# Patient Record
Sex: Female | Born: 1950 | Race: White | Hispanic: No | Marital: Single | Smoking: Former smoker
Health system: Southern US, Community
[De-identification: ages and names within clinical notes are randomized; demographics above are authoritative.]

## PROBLEM LIST (undated history)

## (undated) DIAGNOSIS — I10 Essential (primary) hypertension: Secondary | ICD-10-CM

## (undated) DIAGNOSIS — E119 Type 2 diabetes mellitus without complications: Secondary | ICD-10-CM

## (undated) DIAGNOSIS — N200 Calculus of kidney: Secondary | ICD-10-CM

## (undated) DIAGNOSIS — E079 Disorder of thyroid, unspecified: Secondary | ICD-10-CM

## (undated) HISTORY — PX: ABDOMINAL HYSTERECTOMY: SHX81

## (undated) HISTORY — PX: URETERAL STENT PLACEMENT: SHX822

---

## 2004-07-02 ENCOUNTER — Emergency Department: Payer: Self-pay | Admitting: Internal Medicine

## 2004-08-09 ENCOUNTER — Emergency Department: Payer: Self-pay | Admitting: Emergency Medicine

## 2005-01-05 ENCOUNTER — Emergency Department: Payer: Self-pay | Admitting: Emergency Medicine

## 2005-05-14 ENCOUNTER — Ambulatory Visit: Payer: Self-pay | Admitting: Internal Medicine

## 2005-12-04 ENCOUNTER — Ambulatory Visit: Payer: Self-pay | Admitting: Internal Medicine

## 2005-12-09 ENCOUNTER — Ambulatory Visit: Payer: Self-pay | Admitting: Family Medicine

## 2006-01-14 ENCOUNTER — Ambulatory Visit: Payer: Self-pay | Admitting: Family Medicine

## 2006-02-12 ENCOUNTER — Ambulatory Visit: Payer: Self-pay | Admitting: Family Medicine

## 2006-03-11 ENCOUNTER — Ambulatory Visit: Payer: Self-pay | Admitting: Gastroenterology

## 2006-04-01 ENCOUNTER — Ambulatory Visit: Payer: Self-pay | Admitting: Gastroenterology

## 2006-04-09 ENCOUNTER — Ambulatory Visit: Payer: Self-pay | Admitting: Gastroenterology

## 2006-04-12 ENCOUNTER — Ambulatory Visit: Payer: Self-pay | Admitting: Gastroenterology

## 2007-07-26 ENCOUNTER — Emergency Department: Payer: Self-pay | Admitting: Emergency Medicine

## 2007-07-26 ENCOUNTER — Other Ambulatory Visit: Payer: Self-pay

## 2015-02-15 DIAGNOSIS — R600 Localized edema: Secondary | ICD-10-CM | POA: Insufficient documentation

## 2015-02-15 DIAGNOSIS — R42 Dizziness and giddiness: Secondary | ICD-10-CM | POA: Insufficient documentation

## 2015-04-19 DIAGNOSIS — Z9181 History of falling: Secondary | ICD-10-CM | POA: Insufficient documentation

## 2015-08-30 DIAGNOSIS — D500 Iron deficiency anemia secondary to blood loss (chronic): Secondary | ICD-10-CM | POA: Insufficient documentation

## 2017-03-30 ENCOUNTER — Encounter: Payer: Self-pay | Admitting: Emergency Medicine

## 2017-03-30 ENCOUNTER — Other Ambulatory Visit: Payer: Self-pay

## 2017-03-30 DIAGNOSIS — Z5321 Procedure and treatment not carried out due to patient leaving prior to being seen by health care provider: Secondary | ICD-10-CM | POA: Insufficient documentation

## 2017-03-30 DIAGNOSIS — R1031 Right lower quadrant pain: Secondary | ICD-10-CM | POA: Diagnosis present

## 2017-03-30 DIAGNOSIS — R110 Nausea: Secondary | ICD-10-CM | POA: Diagnosis not present

## 2017-03-30 LAB — CBC
HEMATOCRIT: 39.7 % (ref 35.0–47.0)
HEMOGLOBIN: 13.3 g/dL (ref 12.0–16.0)
MCH: 29.6 pg (ref 26.0–34.0)
MCHC: 33.6 g/dL (ref 32.0–36.0)
MCV: 88.2 fL (ref 80.0–100.0)
Platelets: 103 10*3/uL — ABNORMAL LOW (ref 150–440)
RBC: 4.5 MIL/uL (ref 3.80–5.20)
RDW: 14.7 % — ABNORMAL HIGH (ref 11.5–14.5)
WBC: 7.4 10*3/uL (ref 3.6–11.0)

## 2017-03-30 LAB — URINALYSIS, COMPLETE (UACMP) WITH MICROSCOPIC
BILIRUBIN URINE: NEGATIVE
Bacteria, UA: NONE SEEN
Glucose, UA: NEGATIVE mg/dL
Ketones, ur: NEGATIVE mg/dL
LEUKOCYTES UA: NEGATIVE
Nitrite: NEGATIVE
PROTEIN: 100 mg/dL — AB
Specific Gravity, Urine: 1.016 (ref 1.005–1.030)
pH: 5 (ref 5.0–8.0)

## 2017-03-30 LAB — LIPASE, BLOOD: Lipase: 44 U/L (ref 11–51)

## 2017-03-30 LAB — COMPREHENSIVE METABOLIC PANEL
ALT: 39 U/L (ref 14–54)
AST: 42 U/L — ABNORMAL HIGH (ref 15–41)
Albumin: 3.9 g/dL (ref 3.5–5.0)
Alkaline Phosphatase: 88 U/L (ref 38–126)
Anion gap: 12 (ref 5–15)
BUN: 29 mg/dL — AB (ref 6–20)
CALCIUM: 9.4 mg/dL (ref 8.9–10.3)
CO2: 27 mmol/L (ref 22–32)
Chloride: 101 mmol/L (ref 101–111)
Creatinine, Ser: 1.81 mg/dL — ABNORMAL HIGH (ref 0.44–1.00)
GFR, EST AFRICAN AMERICAN: 33 mL/min — AB (ref >60)
GFR, EST NON AFRICAN AMERICAN: 28 mL/min — AB (ref >60)
Glucose, Bld: 189 mg/dL — ABNORMAL HIGH (ref 65–99)
Potassium: 2.9 mmol/L — ABNORMAL LOW (ref 3.5–5.1)
Sodium: 140 mmol/L (ref 135–145)
Total Bilirubin: 0.8 mg/dL (ref 0.3–1.2)
Total Protein: 7.3 g/dL (ref 6.5–8.1)

## 2017-03-30 NOTE — ED Triage Notes (Signed)
Pt to triage via w/c with no distress noted; pt reports right lower abd pain since yesterday accomp by nausea; denies radiating pain; denies hx of same

## 2017-03-31 ENCOUNTER — Emergency Department
Admission: EM | Admit: 2017-03-31 | Discharge: 2017-03-31 | Disposition: A | Discharge status: Home / Self Care | Payer: Medicare Other | Attending: Emergency Medicine | Admitting: Emergency Medicine

## 2017-03-31 ENCOUNTER — Telehealth: Payer: Self-pay | Admitting: Emergency Medicine

## 2017-03-31 HISTORY — DX: Calculus of kidney: N20.0

## 2017-03-31 NOTE — ED Notes (Signed)
No answer when called from lobby for recheck

## 2017-03-31 NOTE — Telephone Encounter (Signed)
Called patient due to lwot to inquire about condition and follow up plans. Left message.   

## 2017-03-31 NOTE — ED Notes (Signed)
No answer when called several times from lobby 

## 2017-05-24 DIAGNOSIS — N049 Nephrotic syndrome with unspecified morphologic changes: Secondary | ICD-10-CM | POA: Insufficient documentation

## 2017-11-10 DIAGNOSIS — E119 Type 2 diabetes mellitus without complications: Secondary | ICD-10-CM | POA: Insufficient documentation

## 2018-03-25 DIAGNOSIS — M109 Gout, unspecified: Secondary | ICD-10-CM | POA: Diagnosis not present

## 2018-03-25 DIAGNOSIS — D500 Iron deficiency anemia secondary to blood loss (chronic): Secondary | ICD-10-CM | POA: Diagnosis not present

## 2018-03-25 DIAGNOSIS — M48061 Spinal stenosis, lumbar region without neurogenic claudication: Secondary | ICD-10-CM | POA: Diagnosis not present

## 2018-03-25 DIAGNOSIS — I50812 Chronic right heart failure: Secondary | ICD-10-CM | POA: Diagnosis not present

## 2018-03-25 DIAGNOSIS — E1142 Type 2 diabetes mellitus with diabetic polyneuropathy: Secondary | ICD-10-CM | POA: Diagnosis not present

## 2018-03-25 DIAGNOSIS — R600 Localized edema: Secondary | ICD-10-CM | POA: Diagnosis not present

## 2018-03-25 DIAGNOSIS — K6389 Other specified diseases of intestine: Secondary | ICD-10-CM | POA: Diagnosis not present

## 2018-03-25 DIAGNOSIS — N049 Nephrotic syndrome with unspecified morphologic changes: Secondary | ICD-10-CM | POA: Diagnosis not present

## 2018-03-25 DIAGNOSIS — R42 Dizziness and giddiness: Secondary | ICD-10-CM | POA: Diagnosis not present

## 2018-03-25 DIAGNOSIS — N183 Chronic kidney disease, stage 3 (moderate): Secondary | ICD-10-CM | POA: Diagnosis not present

## 2018-03-25 DIAGNOSIS — R1312 Dysphagia, oropharyngeal phase: Secondary | ICD-10-CM | POA: Diagnosis not present

## 2018-03-25 DIAGNOSIS — E034 Atrophy of thyroid (acquired): Secondary | ICD-10-CM | POA: Diagnosis not present

## 2018-03-25 DIAGNOSIS — K219 Gastro-esophageal reflux disease without esophagitis: Secondary | ICD-10-CM | POA: Diagnosis not present

## 2018-03-25 DIAGNOSIS — M25561 Pain in right knee: Secondary | ICD-10-CM | POA: Diagnosis not present

## 2018-03-25 DIAGNOSIS — E1121 Type 2 diabetes mellitus with diabetic nephropathy: Secondary | ICD-10-CM | POA: Diagnosis not present

## 2018-03-25 DIAGNOSIS — I10 Essential (primary) hypertension: Secondary | ICD-10-CM | POA: Diagnosis not present

## 2018-03-25 DIAGNOSIS — F329 Major depressive disorder, single episode, unspecified: Secondary | ICD-10-CM | POA: Diagnosis not present

## 2018-03-25 DIAGNOSIS — K769 Liver disease, unspecified: Secondary | ICD-10-CM | POA: Diagnosis not present

## 2018-03-25 DIAGNOSIS — S46011S Strain of muscle(s) and tendon(s) of the rotator cuff of right shoulder, sequela: Secondary | ICD-10-CM | POA: Diagnosis not present

## 2018-03-25 DIAGNOSIS — N202 Calculus of kidney with calculus of ureter: Secondary | ICD-10-CM | POA: Diagnosis not present

## 2018-06-02 DIAGNOSIS — I50812 Chronic right heart failure: Secondary | ICD-10-CM | POA: Diagnosis not present

## 2018-06-02 DIAGNOSIS — I349 Nonrheumatic mitral valve disorder, unspecified: Secondary | ICD-10-CM | POA: Diagnosis not present

## 2018-06-02 DIAGNOSIS — I340 Nonrheumatic mitral (valve) insufficiency: Secondary | ICD-10-CM | POA: Diagnosis not present

## 2018-06-02 DIAGNOSIS — I517 Cardiomegaly: Secondary | ICD-10-CM | POA: Diagnosis not present

## 2018-09-02 ENCOUNTER — Emergency Department: Payer: PPO

## 2018-09-02 ENCOUNTER — Other Ambulatory Visit: Payer: Self-pay

## 2018-09-02 ENCOUNTER — Emergency Department
Admission: EM | Admit: 2018-09-02 | Discharge: 2018-09-02 | Disposition: A | Discharge status: Home / Self Care | Payer: PPO | Attending: Emergency Medicine | Admitting: Emergency Medicine

## 2018-09-02 DIAGNOSIS — M7989 Other specified soft tissue disorders: Secondary | ICD-10-CM | POA: Diagnosis not present

## 2018-09-02 DIAGNOSIS — I10 Essential (primary) hypertension: Secondary | ICD-10-CM | POA: Diagnosis not present

## 2018-09-02 DIAGNOSIS — L0390 Cellulitis, unspecified: Secondary | ICD-10-CM | POA: Diagnosis not present

## 2018-09-02 DIAGNOSIS — Z87891 Personal history of nicotine dependence: Secondary | ICD-10-CM | POA: Insufficient documentation

## 2018-09-02 DIAGNOSIS — M79672 Pain in left foot: Secondary | ICD-10-CM | POA: Diagnosis present

## 2018-09-02 DIAGNOSIS — E119 Type 2 diabetes mellitus without complications: Secondary | ICD-10-CM | POA: Insufficient documentation

## 2018-09-02 DIAGNOSIS — R2242 Localized swelling, mass and lump, left lower limb: Secondary | ICD-10-CM | POA: Insufficient documentation

## 2018-09-02 DIAGNOSIS — L03116 Cellulitis of left lower limb: Secondary | ICD-10-CM | POA: Diagnosis not present

## 2018-09-02 HISTORY — DX: Type 2 diabetes mellitus without complications: E11.9

## 2018-09-02 HISTORY — DX: Essential (primary) hypertension: I10

## 2018-09-02 HISTORY — DX: Disorder of thyroid, unspecified: E07.9

## 2018-09-02 LAB — CBC WITH DIFFERENTIAL/PLATELET
Abs Immature Granulocytes: 0.02 10*3/uL (ref 0.00–0.07)
Basophils Absolute: 0 10*3/uL (ref 0.0–0.1)
Basophils Relative: 1 %
Eosinophils Absolute: 0 10*3/uL (ref 0.0–0.5)
Eosinophils Relative: 0 %
HCT: 35.4 % — ABNORMAL LOW (ref 36.0–46.0)
Hemoglobin: 11.7 g/dL — ABNORMAL LOW (ref 12.0–15.0)
Immature Granulocytes: 0 %
Lymphocytes Relative: 23 %
Lymphs Abs: 1.5 10*3/uL (ref 0.7–4.0)
MCH: 29.5 pg (ref 26.0–34.0)
MCHC: 33.1 g/dL (ref 30.0–36.0)
MCV: 89.4 fL (ref 80.0–100.0)
Monocytes Absolute: 0.6 10*3/uL (ref 0.1–1.0)
Monocytes Relative: 10 %
Neutro Abs: 4.5 10*3/uL (ref 1.7–7.7)
Neutrophils Relative %: 66 %
Platelets: 122 10*3/uL — ABNORMAL LOW (ref 150–400)
RBC: 3.96 MIL/uL (ref 3.87–5.11)
RDW: 13.8 % (ref 11.5–15.5)
WBC: 6.7 10*3/uL (ref 4.0–10.5)
nRBC: 0 % (ref 0.0–0.2)

## 2018-09-02 LAB — COMPREHENSIVE METABOLIC PANEL
ALT: 47 U/L — ABNORMAL HIGH (ref 0–44)
AST: 49 U/L — ABNORMAL HIGH (ref 15–41)
Albumin: 4.2 g/dL (ref 3.5–5.0)
Alkaline Phosphatase: 89 U/L (ref 38–126)
Anion gap: 8 (ref 5–15)
BUN: 39 mg/dL — ABNORMAL HIGH (ref 8–23)
CO2: 28 mmol/L (ref 22–32)
Calcium: 9.9 mg/dL (ref 8.9–10.3)
Chloride: 99 mmol/L (ref 98–111)
Creatinine, Ser: 2.66 mg/dL — ABNORMAL HIGH (ref 0.44–1.00)
GFR calc Af Amer: 21 mL/min — ABNORMAL LOW (ref >60)
GFR calc non Af Amer: 18 mL/min — ABNORMAL LOW (ref >60)
Glucose, Bld: 167 mg/dL — ABNORMAL HIGH (ref 70–99)
Potassium: 3 mmol/L — ABNORMAL LOW (ref 3.5–5.1)
Sodium: 135 mmol/L (ref 135–145)
Total Bilirubin: 1 mg/dL (ref 0.3–1.2)
Total Protein: 7.5 g/dL (ref 6.5–8.1)

## 2018-09-02 LAB — LACTIC ACID, PLASMA: Lactic Acid, Venous: 1.7 mmol/L (ref 0.5–1.9)

## 2018-09-02 MED ORDER — SODIUM CHLORIDE 0.9% FLUSH: 3.0000 mL | Freq: Once | INTRAVENOUS | Status: DC

## 2018-09-02 MED ORDER — CEPHALEXIN 500 MG PO CAPS: 500.0000 mg | ORAL_CAPSULE | Freq: Four times a day (QID) | ORAL | 0 refills | Status: AC

## 2018-09-02 MED ORDER — VANCOMYCIN HCL IN DEXTROSE 1-5 GM/200ML-% IV SOLN
1000.0000 mg | Freq: Once | INTRAVENOUS | Status: AC
  Administered 2018-09-02: 20:00:00 1000 mg via INTRAVENOUS
  Filled 2018-09-02: qty 200

## 2018-09-02 NOTE — ED Notes (Signed)
Doppler performed on left foot. Strong pulse noted at 62 bpm. Distal cap refill at <3 sec. Dr Archie Balboa notified.

## 2018-09-02 NOTE — ED Notes (Signed)
One set of cultures sent to lab and full rainbow plus lactic

## 2018-09-02 NOTE — Discharge Instructions (Addendum)
Please seek medical attention for any high fevers, chest pain, shortness of breath, change in behavior, persistent vomiting, bloody stool or any other new or concerning symptoms.  

## 2018-09-02 NOTE — ED Triage Notes (Addendum)
Pt comes from [place] with c/o left foot swelling. Pt states this started two days ago. Pt has noticeable swelling, redness and pain to left foot.  Pt also has sore noted to bottom of front. Pt has hx of diabetes.  Pt denies any recent fevers.

## 2018-09-02 NOTE — ED Notes (Signed)
Approx 2cm wound on bottom of left foot cleaned and dressed with telfa dressing. Pt educated on keeping site dry and dressing technique. Pt verbalized understanding.

## 2018-09-02 NOTE — ED Provider Notes (Signed)
[facility] Emergency Department Provider Note   ____________________________________________   I have reviewed the triage vital signs and the nursing notes.   HISTORY  Chief Complaint Foot Swelling   History limited by: Not Limited   HPI Autumn [name] is a 68 y.o. female who presents to the emergency department today because of concern for left foot pain and swelling. Patient states that she will occasionally get some swelling in the left foot. A couple of days ago she did pick at a callous that was on the bottom of her foot. The patient says that starting two days ago she has noticed increased swelling and red color to her foot. She has had some discomfort. She denies any fevers. Denies any nausea or vomiting.   Records reviewed. Per medical record review patient has a history of DM, HTN.   Past Medical History:  Diagnosis Date  . Diabetes mellitus without complication ([place])   . Hypertension   . Kidney stone   . Thyroid disease     There are no active problems to display for this patient.   Past Surgical History:  Procedure Laterality Date  . ABDOMINAL HYSTERECTOMY    . URETERAL STENT PLACEMENT      Prior to Admission medications   Not on File    Allergies Patient has no known allergies.  No family history on file.  Social History Social History   Tobacco Use  . Smoking status: Former Research scientist (life sciences)  . Smokeless tobacco: Never Used  Substance Use Topics  . Alcohol use: Not on file  . Drug use: Not on file    Review of Systems Constitutional: No fever/chills Eyes: No visual changes. ENT: No sore throat. Cardiovascular: Denies chest pain. Respiratory: Denies shortness of breath. Gastrointestinal: No abdominal pain.  No nausea, no vomiting.  No diarrhea.   Genitourinary: Negative for dysuria. Musculoskeletal: Left foot swelling.  Skin: Positive for redness to the left foot.  Neurological: Negative for headaches, focal weakness or  numbness.  ____________________________________________   PHYSICAL EXAM:  VITAL SIGNS: ED Triage Vitals [09/02/18 1725]  Enc Vitals Group     BP (!) 169/84     Pulse Rate 66     Resp 19     Temp 98.7 F (37.1 C)     Temp src      SpO2 99 %     Weight 208 lb (94.3 kg)     Height 5\' 5"  (1.651 m)     Head Circumference      Peak Flow      Pain Score 10   Constitutional: Alert and oriented.  Eyes: Conjunctivae are normal.  ENT      Head: Normocephalic and atraumatic.      Nose: No congestion/rhinnorhea.      Mouth/Throat: Mucous membranes are moist.      Neck: No stridor. Hematological/Lymphatic/Immunilogical: No cervical lymphadenopathy. Cardiovascular: Normal rate, regular rhythm.  No murmurs, rubs, or gallops.  Respiratory: Normal respiratory effort without tachypnea nor retractions. Breath sounds are clear and equal bilaterally. No wheezes/rales/rhonchi. Gastrointestinal: Soft and non tender. No rebound. No guarding.  Genitourinary: Deferred Musculoskeletal: Positive for swelling and erythema to the left foot.  Neurologic:  Normal speech and language. No gross focal neurologic deficits are appreciated.  Skin:  Skin is warm, dry and intact. No rash noted. Psychiatric: Mood and affect are normal. Speech and behavior are normal. Patient exhibits appropriate insight and judgment.  ____________________________________________    LABS (pertinent positives/negatives)  Lactic  1.7 CMP na 135, k 3.0, glu 167, cr 2.66 CBC wbc 6.7, hgb 11.7, plt 122  ____________________________________________   EKG  None  ____________________________________________    RADIOLOGY  Left foot No evidence of osteomyelitis  ____________________________________________   PROCEDURES  Procedures  ____________________________________________   INITIAL IMPRESSION / ASSESSMENT AND PLAN / ED COURSE  Pertinent labs & imaging results that were available during my care of the  patient were reviewed by me and considered in my medical decision making (see chart for details).   Patient presented to the emergency department today because of concerns for left foot swelling and redness.  On exam there is swelling and erythema to the foot.  There is warmth.  Do have concerns for infection given callus in the bottom of the foot that the patient opened up.  X-ray does not show signs of osteomyelitis patient without concerning leukocytosis.  Will plan on giving patient dose of antibiotics here and have been following up with podiatry.  Discussed return precautions.   ____________________________________________   FINAL CLINICAL IMPRESSION(S) / ED DIAGNOSES  Final diagnoses:  Cellulitis, unspecified cellulitis site     Note: This dictation was prepared with Dragon dictation. Any transcriptional errors that result from this process are unintentional     Nance Pear, MD 09/02/18 2308

## 2018-09-16 DIAGNOSIS — E1142 Type 2 diabetes mellitus with diabetic polyneuropathy: Secondary | ICD-10-CM | POA: Diagnosis not present

## 2018-09-16 DIAGNOSIS — I10 Essential (primary) hypertension: Secondary | ICD-10-CM | POA: Diagnosis not present

## 2018-09-16 DIAGNOSIS — N049 Nephrotic syndrome with unspecified morphologic changes: Secondary | ICD-10-CM | POA: Diagnosis not present

## 2018-09-16 DIAGNOSIS — J449 Chronic obstructive pulmonary disease, unspecified: Secondary | ICD-10-CM | POA: Diagnosis not present

## 2018-09-16 DIAGNOSIS — K769 Liver disease, unspecified: Secondary | ICD-10-CM | POA: Diagnosis not present

## 2018-09-16 DIAGNOSIS — K219 Gastro-esophageal reflux disease without esophagitis: Secondary | ICD-10-CM | POA: Diagnosis not present

## 2018-09-16 DIAGNOSIS — N390 Urinary tract infection, site not specified: Secondary | ICD-10-CM | POA: Diagnosis not present

## 2018-09-16 DIAGNOSIS — S46011S Strain of muscle(s) and tendon(s) of the rotator cuff of right shoulder, sequela: Secondary | ICD-10-CM | POA: Diagnosis not present

## 2018-09-16 DIAGNOSIS — R600 Localized edema: Secondary | ICD-10-CM | POA: Diagnosis not present

## 2018-09-16 DIAGNOSIS — F329 Major depressive disorder, single episode, unspecified: Secondary | ICD-10-CM | POA: Diagnosis not present

## 2018-09-16 DIAGNOSIS — N202 Calculus of kidney with calculus of ureter: Secondary | ICD-10-CM | POA: Diagnosis not present

## 2018-09-16 DIAGNOSIS — D500 Iron deficiency anemia secondary to blood loss (chronic): Secondary | ICD-10-CM | POA: Diagnosis not present

## 2018-09-16 DIAGNOSIS — M109 Gout, unspecified: Secondary | ICD-10-CM | POA: Diagnosis not present

## 2018-09-16 DIAGNOSIS — E034 Atrophy of thyroid (acquired): Secondary | ICD-10-CM | POA: Diagnosis not present

## 2018-09-16 DIAGNOSIS — E1121 Type 2 diabetes mellitus with diabetic nephropathy: Secondary | ICD-10-CM | POA: Diagnosis not present

## 2018-09-16 DIAGNOSIS — K6389 Other specified diseases of intestine: Secondary | ICD-10-CM | POA: Diagnosis not present

## 2018-09-16 DIAGNOSIS — N184 Chronic kidney disease, stage 4 (severe): Secondary | ICD-10-CM | POA: Diagnosis not present

## 2018-10-03 DIAGNOSIS — E11621 Type 2 diabetes mellitus with foot ulcer: Secondary | ICD-10-CM | POA: Diagnosis not present

## 2018-10-03 DIAGNOSIS — M109 Gout, unspecified: Secondary | ICD-10-CM | POA: Diagnosis not present

## 2018-10-03 DIAGNOSIS — I10 Essential (primary) hypertension: Secondary | ICD-10-CM | POA: Diagnosis not present

## 2018-10-03 DIAGNOSIS — M799 Soft tissue disorder, unspecified: Secondary | ICD-10-CM | POA: Diagnosis not present

## 2018-10-03 DIAGNOSIS — N202 Calculus of kidney with calculus of ureter: Secondary | ICD-10-CM | POA: Diagnosis not present

## 2018-10-03 DIAGNOSIS — L03818 Cellulitis of other sites: Secondary | ICD-10-CM | POA: Diagnosis not present

## 2018-10-03 DIAGNOSIS — N184 Chronic kidney disease, stage 4 (severe): Secondary | ICD-10-CM | POA: Diagnosis not present

## 2018-10-03 DIAGNOSIS — R42 Dizziness and giddiness: Secondary | ICD-10-CM | POA: Diagnosis not present

## 2018-10-03 DIAGNOSIS — M7989 Other specified soft tissue disorders: Secondary | ICD-10-CM | POA: Diagnosis not present

## 2018-10-03 DIAGNOSIS — Z1210 Encounter for screening for malignant neoplasm of intestinal tract, unspecified: Secondary | ICD-10-CM | POA: Diagnosis not present

## 2018-10-03 DIAGNOSIS — N049 Nephrotic syndrome with unspecified morphologic changes: Secondary | ICD-10-CM | POA: Diagnosis not present

## 2018-10-03 DIAGNOSIS — E1142 Type 2 diabetes mellitus with diabetic polyneuropathy: Secondary | ICD-10-CM | POA: Diagnosis not present

## 2018-10-03 DIAGNOSIS — R600 Localized edema: Secondary | ICD-10-CM | POA: Diagnosis not present

## 2018-10-03 DIAGNOSIS — L97528 Non-pressure chronic ulcer of other part of left foot with other specified severity: Secondary | ICD-10-CM | POA: Diagnosis not present

## 2018-10-03 DIAGNOSIS — Z9181 History of falling: Secondary | ICD-10-CM | POA: Diagnosis not present

## 2018-10-07 DIAGNOSIS — E1140 Type 2 diabetes mellitus with diabetic neuropathy, unspecified: Secondary | ICD-10-CM | POA: Diagnosis not present

## 2018-10-07 DIAGNOSIS — I129 Hypertensive chronic kidney disease with stage 1 through stage 4 chronic kidney disease, or unspecified chronic kidney disease: Secondary | ICD-10-CM | POA: Diagnosis not present

## 2018-10-07 DIAGNOSIS — L97529 Non-pressure chronic ulcer of other part of left foot with unspecified severity: Secondary | ICD-10-CM | POA: Diagnosis not present

## 2018-10-07 DIAGNOSIS — E1121 Type 2 diabetes mellitus with diabetic nephropathy: Secondary | ICD-10-CM | POA: Diagnosis not present

## 2018-10-07 DIAGNOSIS — E11621 Type 2 diabetes mellitus with foot ulcer: Secondary | ICD-10-CM | POA: Diagnosis not present

## 2018-10-07 DIAGNOSIS — I9589 Other hypotension: Secondary | ICD-10-CM | POA: Diagnosis not present

## 2018-10-07 DIAGNOSIS — E1165 Type 2 diabetes mellitus with hyperglycemia: Secondary | ICD-10-CM | POA: Diagnosis not present

## 2018-10-07 DIAGNOSIS — Z9181 History of falling: Secondary | ICD-10-CM | POA: Diagnosis not present

## 2018-10-07 DIAGNOSIS — N189 Chronic kidney disease, unspecified: Secondary | ICD-10-CM | POA: Diagnosis not present

## 2018-10-12 DIAGNOSIS — N184 Chronic kidney disease, stage 4 (severe): Secondary | ICD-10-CM | POA: Diagnosis not present

## 2018-10-12 DIAGNOSIS — B952 Enterococcus as the cause of diseases classified elsewhere: Secondary | ICD-10-CM | POA: Diagnosis not present

## 2018-10-12 DIAGNOSIS — Z01810 Encounter for preprocedural cardiovascular examination: Secondary | ICD-10-CM | POA: Diagnosis not present

## 2018-10-12 DIAGNOSIS — R531 Weakness: Secondary | ICD-10-CM | POA: Diagnosis not present

## 2018-10-12 DIAGNOSIS — Z20828 Contact with and (suspected) exposure to other viral communicable diseases: Secondary | ICD-10-CM | POA: Diagnosis not present

## 2018-10-12 DIAGNOSIS — E11621 Type 2 diabetes mellitus with foot ulcer: Secondary | ICD-10-CM | POA: Diagnosis not present

## 2018-10-12 DIAGNOSIS — E11622 Type 2 diabetes mellitus with other skin ulcer: Secondary | ICD-10-CM | POA: Diagnosis not present

## 2018-10-12 DIAGNOSIS — B964 Proteus (mirabilis) (morganii) as the cause of diseases classified elsewhere: Secondary | ICD-10-CM | POA: Diagnosis not present

## 2018-10-12 DIAGNOSIS — I10 Essential (primary) hypertension: Secondary | ICD-10-CM | POA: Diagnosis not present

## 2018-10-12 DIAGNOSIS — L02612 Cutaneous abscess of left foot: Secondary | ICD-10-CM | POA: Diagnosis not present

## 2018-10-12 DIAGNOSIS — L03116 Cellulitis of left lower limb: Secondary | ICD-10-CM | POA: Diagnosis not present

## 2018-10-12 DIAGNOSIS — L03115 Cellulitis of right lower limb: Secondary | ICD-10-CM | POA: Diagnosis not present

## 2018-10-12 DIAGNOSIS — Z794 Long term (current) use of insulin: Secondary | ICD-10-CM | POA: Diagnosis not present

## 2018-10-12 DIAGNOSIS — W19XXXA Unspecified fall, initial encounter: Secondary | ICD-10-CM | POA: Diagnosis not present

## 2018-10-12 DIAGNOSIS — E079 Disorder of thyroid, unspecified: Secondary | ICD-10-CM | POA: Diagnosis not present

## 2018-10-12 DIAGNOSIS — Z7982 Long term (current) use of aspirin: Secondary | ICD-10-CM | POA: Diagnosis not present

## 2018-10-12 DIAGNOSIS — Z9181 History of falling: Secondary | ICD-10-CM | POA: Diagnosis not present

## 2018-10-12 DIAGNOSIS — Z8739 Personal history of other diseases of the musculoskeletal system and connective tissue: Secondary | ICD-10-CM | POA: Diagnosis not present

## 2018-10-12 DIAGNOSIS — E1142 Type 2 diabetes mellitus with diabetic polyneuropathy: Secondary | ICD-10-CM | POA: Diagnosis not present

## 2018-10-12 DIAGNOSIS — L089 Local infection of the skin and subcutaneous tissue, unspecified: Secondary | ICD-10-CM | POA: Diagnosis not present

## 2018-10-12 DIAGNOSIS — I951 Orthostatic hypotension: Secondary | ICD-10-CM | POA: Diagnosis not present

## 2018-10-12 DIAGNOSIS — L97529 Non-pressure chronic ulcer of other part of left foot with unspecified severity: Secondary | ICD-10-CM | POA: Diagnosis not present

## 2018-10-12 DIAGNOSIS — E1122 Type 2 diabetes mellitus with diabetic chronic kidney disease: Secondary | ICD-10-CM | POA: Diagnosis not present

## 2018-10-12 DIAGNOSIS — G8918 Other acute postprocedural pain: Secondary | ICD-10-CM | POA: Diagnosis not present

## 2018-10-12 DIAGNOSIS — F329 Major depressive disorder, single episode, unspecified: Secondary | ICD-10-CM | POA: Diagnosis not present

## 2018-10-12 DIAGNOSIS — L97526 Non-pressure chronic ulcer of other part of left foot with bone involvement without evidence of necrosis: Secondary | ICD-10-CM | POA: Diagnosis not present

## 2018-10-12 DIAGNOSIS — E1169 Type 2 diabetes mellitus with other specified complication: Secondary | ICD-10-CM | POA: Diagnosis not present

## 2018-10-12 DIAGNOSIS — B9562 Methicillin resistant Staphylococcus aureus infection as the cause of diseases classified elsewhere: Secondary | ICD-10-CM | POA: Diagnosis not present

## 2018-10-12 DIAGNOSIS — S92512A Displaced fracture of proximal phalanx of left lesser toe(s), initial encounter for closed fracture: Secondary | ICD-10-CM | POA: Diagnosis not present

## 2018-10-12 DIAGNOSIS — M25775 Osteophyte, left foot: Secondary | ICD-10-CM | POA: Diagnosis not present

## 2018-10-12 DIAGNOSIS — M86172 Other acute osteomyelitis, left ankle and foot: Secondary | ICD-10-CM | POA: Diagnosis not present

## 2018-10-12 DIAGNOSIS — E1140 Type 2 diabetes mellitus with diabetic neuropathy, unspecified: Secondary | ICD-10-CM | POA: Diagnosis not present

## 2018-10-12 DIAGNOSIS — E118 Type 2 diabetes mellitus with unspecified complications: Secondary | ICD-10-CM | POA: Diagnosis not present

## 2018-10-12 DIAGNOSIS — K219 Gastro-esophageal reflux disease without esophagitis: Secondary | ICD-10-CM | POA: Diagnosis not present

## 2018-10-12 DIAGNOSIS — L97522 Non-pressure chronic ulcer of other part of left foot with fat layer exposed: Secondary | ICD-10-CM | POA: Diagnosis not present

## 2018-10-12 DIAGNOSIS — R279 Unspecified lack of coordination: Secondary | ICD-10-CM | POA: Diagnosis not present

## 2018-10-12 DIAGNOSIS — Z743 Need for continuous supervision: Secondary | ICD-10-CM | POA: Diagnosis not present

## 2018-10-12 DIAGNOSIS — M869 Osteomyelitis, unspecified: Secondary | ICD-10-CM | POA: Diagnosis not present

## 2018-10-12 DIAGNOSIS — K760 Fatty (change of) liver, not elsewhere classified: Secondary | ICD-10-CM | POA: Diagnosis not present

## 2018-10-12 DIAGNOSIS — S92351A Displaced fracture of fifth metatarsal bone, right foot, initial encounter for closed fracture: Secondary | ICD-10-CM | POA: Diagnosis not present

## 2018-10-12 DIAGNOSIS — L97424 Non-pressure chronic ulcer of left heel and midfoot with necrosis of bone: Secondary | ICD-10-CM | POA: Diagnosis not present

## 2018-10-12 DIAGNOSIS — Z452 Encounter for adjustment and management of vascular access device: Secondary | ICD-10-CM | POA: Diagnosis not present

## 2018-10-12 DIAGNOSIS — G4730 Sleep apnea, unspecified: Secondary | ICD-10-CM | POA: Diagnosis not present

## 2018-10-12 DIAGNOSIS — L97429 Non-pressure chronic ulcer of left heel and midfoot with unspecified severity: Secondary | ICD-10-CM | POA: Diagnosis not present

## 2018-10-12 DIAGNOSIS — N185 Chronic kidney disease, stage 5: Secondary | ICD-10-CM | POA: Diagnosis not present

## 2018-10-12 DIAGNOSIS — L02416 Cutaneous abscess of left lower limb: Secondary | ICD-10-CM | POA: Diagnosis not present

## 2018-10-12 DIAGNOSIS — J45909 Unspecified asthma, uncomplicated: Secondary | ICD-10-CM | POA: Diagnosis not present

## 2018-10-12 DIAGNOSIS — M109 Gout, unspecified: Secondary | ICD-10-CM | POA: Diagnosis not present

## 2018-10-12 DIAGNOSIS — I129 Hypertensive chronic kidney disease with stage 1 through stage 4 chronic kidney disease, or unspecified chronic kidney disease: Secondary | ICD-10-CM | POA: Diagnosis not present

## 2018-10-12 DIAGNOSIS — E1165 Type 2 diabetes mellitus with hyperglycemia: Secondary | ICD-10-CM | POA: Diagnosis not present

## 2018-10-12 DIAGNOSIS — Z87891 Personal history of nicotine dependence: Secondary | ICD-10-CM | POA: Diagnosis not present

## 2018-10-12 DIAGNOSIS — R633 Feeding difficulties: Secondary | ICD-10-CM | POA: Diagnosis not present

## 2018-10-12 DIAGNOSIS — R5381 Other malaise: Secondary | ICD-10-CM | POA: Diagnosis not present

## 2018-10-19 DIAGNOSIS — L97522 Non-pressure chronic ulcer of other part of left foot with fat layer exposed: Secondary | ICD-10-CM | POA: Diagnosis not present

## 2018-10-20 DIAGNOSIS — Z9181 History of falling: Secondary | ICD-10-CM | POA: Diagnosis not present

## 2018-10-20 DIAGNOSIS — F329 Major depressive disorder, single episode, unspecified: Secondary | ICD-10-CM | POA: Diagnosis not present

## 2018-10-20 DIAGNOSIS — E11621 Type 2 diabetes mellitus with foot ulcer: Secondary | ICD-10-CM | POA: Diagnosis not present

## 2018-10-20 DIAGNOSIS — L97424 Non-pressure chronic ulcer of left heel and midfoot with necrosis of bone: Secondary | ICD-10-CM | POA: Diagnosis not present

## 2018-10-20 DIAGNOSIS — W19XXXA Unspecified fall, initial encounter: Secondary | ICD-10-CM | POA: Diagnosis not present

## 2018-10-20 DIAGNOSIS — L97522 Non-pressure chronic ulcer of other part of left foot with fat layer exposed: Secondary | ICD-10-CM | POA: Diagnosis not present

## 2018-10-20 DIAGNOSIS — B952 Enterococcus as the cause of diseases classified elsewhere: Secondary | ICD-10-CM | POA: Diagnosis not present

## 2018-10-20 DIAGNOSIS — B964 Proteus (mirabilis) (morganii) as the cause of diseases classified elsewhere: Secondary | ICD-10-CM | POA: Diagnosis not present

## 2018-10-23 DIAGNOSIS — L97522 Non-pressure chronic ulcer of other part of left foot with fat layer exposed: Secondary | ICD-10-CM | POA: Diagnosis not present

## 2018-10-23 DIAGNOSIS — R69 Illness, unspecified: Secondary | ICD-10-CM | POA: Diagnosis not present

## 2018-10-23 DIAGNOSIS — Z9181 History of falling: Secondary | ICD-10-CM | POA: Diagnosis not present

## 2018-10-23 DIAGNOSIS — W19XXXA Unspecified fall, initial encounter: Secondary | ICD-10-CM | POA: Diagnosis not present

## 2018-10-23 DIAGNOSIS — B952 Enterococcus as the cause of diseases classified elsewhere: Secondary | ICD-10-CM | POA: Diagnosis not present

## 2018-10-23 DIAGNOSIS — F329 Major depressive disorder, single episode, unspecified: Secondary | ICD-10-CM | POA: Diagnosis not present

## 2018-10-23 DIAGNOSIS — B964 Proteus (mirabilis) (morganii) as the cause of diseases classified elsewhere: Secondary | ICD-10-CM | POA: Diagnosis not present

## 2018-10-23 DIAGNOSIS — L97424 Non-pressure chronic ulcer of left heel and midfoot with necrosis of bone: Secondary | ICD-10-CM | POA: Diagnosis not present

## 2018-10-23 DIAGNOSIS — E11621 Type 2 diabetes mellitus with foot ulcer: Secondary | ICD-10-CM | POA: Diagnosis not present

## 2018-10-24 DIAGNOSIS — M25562 Pain in left knee: Secondary | ICD-10-CM | POA: Diagnosis not present

## 2018-10-24 DIAGNOSIS — W19XXXA Unspecified fall, initial encounter: Secondary | ICD-10-CM | POA: Diagnosis not present

## 2018-10-24 DIAGNOSIS — N049 Nephrotic syndrome with unspecified morphologic changes: Secondary | ICD-10-CM | POA: Diagnosis not present

## 2018-10-24 DIAGNOSIS — R600 Localized edema: Secondary | ICD-10-CM | POA: Diagnosis not present

## 2018-10-24 DIAGNOSIS — E1143 Type 2 diabetes mellitus with diabetic autonomic (poly)neuropathy: Secondary | ICD-10-CM | POA: Diagnosis not present

## 2018-10-24 DIAGNOSIS — E1165 Type 2 diabetes mellitus with hyperglycemia: Secondary | ICD-10-CM | POA: Diagnosis not present

## 2018-10-24 DIAGNOSIS — E1142 Type 2 diabetes mellitus with diabetic polyneuropathy: Secondary | ICD-10-CM | POA: Diagnosis not present

## 2018-10-24 DIAGNOSIS — K769 Liver disease, unspecified: Secondary | ICD-10-CM | POA: Diagnosis not present

## 2018-10-24 DIAGNOSIS — N184 Chronic kidney disease, stage 4 (severe): Secondary | ICD-10-CM | POA: Diagnosis not present

## 2018-10-24 DIAGNOSIS — M1712 Unilateral primary osteoarthritis, left knee: Secondary | ICD-10-CM | POA: Diagnosis not present

## 2018-10-24 DIAGNOSIS — I10 Essential (primary) hypertension: Secondary | ICD-10-CM | POA: Diagnosis not present

## 2018-10-24 DIAGNOSIS — Z0000 Encounter for general adult medical examination without abnormal findings: Secondary | ICD-10-CM | POA: Diagnosis not present

## 2018-10-24 DIAGNOSIS — R110 Nausea: Secondary | ICD-10-CM | POA: Diagnosis not present

## 2018-10-24 DIAGNOSIS — M25462 Effusion, left knee: Secondary | ICD-10-CM | POA: Diagnosis not present

## 2018-10-24 DIAGNOSIS — E034 Atrophy of thyroid (acquired): Secondary | ICD-10-CM | POA: Diagnosis not present

## 2018-10-24 DIAGNOSIS — L97522 Non-pressure chronic ulcer of other part of left foot with fat layer exposed: Secondary | ICD-10-CM | POA: Diagnosis not present

## 2018-10-24 DIAGNOSIS — D500 Iron deficiency anemia secondary to blood loss (chronic): Secondary | ICD-10-CM | POA: Diagnosis not present

## 2018-10-24 DIAGNOSIS — E11621 Type 2 diabetes mellitus with foot ulcer: Secondary | ICD-10-CM | POA: Diagnosis not present

## 2018-10-27 DIAGNOSIS — Z9181 History of falling: Secondary | ICD-10-CM | POA: Diagnosis not present

## 2018-10-27 DIAGNOSIS — E11621 Type 2 diabetes mellitus with foot ulcer: Secondary | ICD-10-CM | POA: Diagnosis not present

## 2018-10-27 DIAGNOSIS — L97522 Non-pressure chronic ulcer of other part of left foot with fat layer exposed: Secondary | ICD-10-CM | POA: Diagnosis not present

## 2018-10-27 DIAGNOSIS — L97424 Non-pressure chronic ulcer of left heel and midfoot with necrosis of bone: Secondary | ICD-10-CM | POA: Diagnosis not present

## 2018-10-30 DIAGNOSIS — B964 Proteus (mirabilis) (morganii) as the cause of diseases classified elsewhere: Secondary | ICD-10-CM | POA: Diagnosis not present

## 2018-10-30 DIAGNOSIS — Z9181 History of falling: Secondary | ICD-10-CM | POA: Diagnosis not present

## 2018-10-30 DIAGNOSIS — W19XXXA Unspecified fall, initial encounter: Secondary | ICD-10-CM | POA: Diagnosis not present

## 2018-10-30 DIAGNOSIS — B952 Enterococcus as the cause of diseases classified elsewhere: Secondary | ICD-10-CM | POA: Diagnosis not present

## 2018-10-30 DIAGNOSIS — L97424 Non-pressure chronic ulcer of left heel and midfoot with necrosis of bone: Secondary | ICD-10-CM | POA: Diagnosis not present

## 2018-10-30 DIAGNOSIS — F329 Major depressive disorder, single episode, unspecified: Secondary | ICD-10-CM | POA: Diagnosis not present

## 2018-10-30 DIAGNOSIS — L97522 Non-pressure chronic ulcer of other part of left foot with fat layer exposed: Secondary | ICD-10-CM | POA: Diagnosis not present

## 2018-10-30 DIAGNOSIS — M869 Osteomyelitis, unspecified: Secondary | ICD-10-CM | POA: Diagnosis not present

## 2018-10-30 DIAGNOSIS — E11621 Type 2 diabetes mellitus with foot ulcer: Secondary | ICD-10-CM | POA: Diagnosis not present

## 2018-11-02 DIAGNOSIS — M86172 Other acute osteomyelitis, left ankle and foot: Secondary | ICD-10-CM | POA: Diagnosis not present

## 2018-11-02 DIAGNOSIS — E1169 Type 2 diabetes mellitus with other specified complication: Secondary | ICD-10-CM | POA: Diagnosis not present

## 2018-11-02 DIAGNOSIS — E11621 Type 2 diabetes mellitus with foot ulcer: Secondary | ICD-10-CM | POA: Diagnosis not present

## 2018-11-02 DIAGNOSIS — M86272 Subacute osteomyelitis, left ankle and foot: Secondary | ICD-10-CM | POA: Diagnosis not present

## 2018-11-02 DIAGNOSIS — L97424 Non-pressure chronic ulcer of left heel and midfoot with necrosis of bone: Secondary | ICD-10-CM | POA: Diagnosis not present

## 2018-11-03 DIAGNOSIS — L97522 Non-pressure chronic ulcer of other part of left foot with fat layer exposed: Secondary | ICD-10-CM | POA: Diagnosis not present

## 2018-11-06 DIAGNOSIS — F329 Major depressive disorder, single episode, unspecified: Secondary | ICD-10-CM | POA: Diagnosis not present

## 2018-11-06 DIAGNOSIS — B964 Proteus (mirabilis) (morganii) as the cause of diseases classified elsewhere: Secondary | ICD-10-CM | POA: Diagnosis not present

## 2018-11-06 DIAGNOSIS — W19XXXA Unspecified fall, initial encounter: Secondary | ICD-10-CM | POA: Diagnosis not present

## 2018-11-06 DIAGNOSIS — L97522 Non-pressure chronic ulcer of other part of left foot with fat layer exposed: Secondary | ICD-10-CM | POA: Diagnosis not present

## 2018-11-06 DIAGNOSIS — L97512 Non-pressure chronic ulcer of other part of right foot with fat layer exposed: Secondary | ICD-10-CM | POA: Diagnosis not present

## 2018-11-06 DIAGNOSIS — L97424 Non-pressure chronic ulcer of left heel and midfoot with necrosis of bone: Secondary | ICD-10-CM | POA: Diagnosis not present

## 2018-11-06 DIAGNOSIS — E11621 Type 2 diabetes mellitus with foot ulcer: Secondary | ICD-10-CM | POA: Diagnosis not present

## 2018-11-06 DIAGNOSIS — Z9181 History of falling: Secondary | ICD-10-CM | POA: Diagnosis not present

## 2018-11-06 DIAGNOSIS — B952 Enterococcus as the cause of diseases classified elsewhere: Secondary | ICD-10-CM | POA: Diagnosis not present

## 2018-11-07 DIAGNOSIS — I951 Orthostatic hypotension: Secondary | ICD-10-CM | POA: Insufficient documentation

## 2018-11-07 DIAGNOSIS — E876 Hypokalemia: Secondary | ICD-10-CM | POA: Diagnosis not present

## 2018-11-07 DIAGNOSIS — S46011S Strain of muscle(s) and tendon(s) of the rotator cuff of right shoulder, sequela: Secondary | ICD-10-CM | POA: Diagnosis not present

## 2018-11-07 DIAGNOSIS — K6389 Other specified diseases of intestine: Secondary | ICD-10-CM | POA: Diagnosis not present

## 2018-11-07 DIAGNOSIS — D500 Iron deficiency anemia secondary to blood loss (chronic): Secondary | ICD-10-CM | POA: Diagnosis not present

## 2018-11-07 DIAGNOSIS — K769 Liver disease, unspecified: Secondary | ICD-10-CM | POA: Diagnosis not present

## 2018-11-07 DIAGNOSIS — L97424 Non-pressure chronic ulcer of left heel and midfoot with necrosis of bone: Secondary | ICD-10-CM | POA: Diagnosis not present

## 2018-11-07 DIAGNOSIS — I10 Essential (primary) hypertension: Secondary | ICD-10-CM | POA: Diagnosis not present

## 2018-11-07 DIAGNOSIS — M25562 Pain in left knee: Secondary | ICD-10-CM | POA: Diagnosis not present

## 2018-11-07 DIAGNOSIS — E11621 Type 2 diabetes mellitus with foot ulcer: Secondary | ICD-10-CM | POA: Diagnosis not present

## 2018-11-07 DIAGNOSIS — E034 Atrophy of thyroid (acquired): Secondary | ICD-10-CM | POA: Diagnosis not present

## 2018-11-07 DIAGNOSIS — R600 Localized edema: Secondary | ICD-10-CM | POA: Diagnosis not present

## 2018-11-09 DIAGNOSIS — E1169 Type 2 diabetes mellitus with other specified complication: Secondary | ICD-10-CM | POA: Diagnosis not present

## 2018-11-09 DIAGNOSIS — E11621 Type 2 diabetes mellitus with foot ulcer: Secondary | ICD-10-CM | POA: Diagnosis not present

## 2018-11-09 DIAGNOSIS — M86272 Subacute osteomyelitis, left ankle and foot: Secondary | ICD-10-CM | POA: Diagnosis not present

## 2018-11-09 DIAGNOSIS — L97424 Non-pressure chronic ulcer of left heel and midfoot with necrosis of bone: Secondary | ICD-10-CM | POA: Diagnosis not present

## 2018-11-13 DIAGNOSIS — B952 Enterococcus as the cause of diseases classified elsewhere: Secondary | ICD-10-CM | POA: Diagnosis not present

## 2018-11-13 DIAGNOSIS — F329 Major depressive disorder, single episode, unspecified: Secondary | ICD-10-CM | POA: Diagnosis not present

## 2018-11-13 DIAGNOSIS — B964 Proteus (mirabilis) (morganii) as the cause of diseases classified elsewhere: Secondary | ICD-10-CM | POA: Diagnosis not present

## 2018-11-13 DIAGNOSIS — L97522 Non-pressure chronic ulcer of other part of left foot with fat layer exposed: Secondary | ICD-10-CM | POA: Diagnosis not present

## 2018-11-13 DIAGNOSIS — L97424 Non-pressure chronic ulcer of left heel and midfoot with necrosis of bone: Secondary | ICD-10-CM | POA: Diagnosis not present

## 2018-11-13 DIAGNOSIS — Z9181 History of falling: Secondary | ICD-10-CM | POA: Diagnosis not present

## 2018-11-13 DIAGNOSIS — E11621 Type 2 diabetes mellitus with foot ulcer: Secondary | ICD-10-CM | POA: Diagnosis not present

## 2018-11-13 DIAGNOSIS — W19XXXA Unspecified fall, initial encounter: Secondary | ICD-10-CM | POA: Diagnosis not present

## 2018-11-16 DIAGNOSIS — L97424 Non-pressure chronic ulcer of left heel and midfoot with necrosis of bone: Secondary | ICD-10-CM | POA: Diagnosis not present

## 2018-11-16 DIAGNOSIS — E11621 Type 2 diabetes mellitus with foot ulcer: Secondary | ICD-10-CM | POA: Diagnosis not present

## 2018-11-16 DIAGNOSIS — M86272 Subacute osteomyelitis, left ankle and foot: Secondary | ICD-10-CM | POA: Diagnosis not present

## 2018-11-16 DIAGNOSIS — E1169 Type 2 diabetes mellitus with other specified complication: Secondary | ICD-10-CM | POA: Diagnosis not present

## 2018-11-16 DIAGNOSIS — L97528 Non-pressure chronic ulcer of other part of left foot with other specified severity: Secondary | ICD-10-CM | POA: Diagnosis not present

## 2018-11-16 DIAGNOSIS — R600 Localized edema: Secondary | ICD-10-CM | POA: Diagnosis not present

## 2018-11-17 DIAGNOSIS — L97522 Non-pressure chronic ulcer of other part of left foot with fat layer exposed: Secondary | ICD-10-CM | POA: Diagnosis not present

## 2018-11-20 DIAGNOSIS — M869 Osteomyelitis, unspecified: Secondary | ICD-10-CM | POA: Diagnosis not present

## 2018-11-22 DIAGNOSIS — E11621 Type 2 diabetes mellitus with foot ulcer: Secondary | ICD-10-CM | POA: Diagnosis not present

## 2018-11-22 DIAGNOSIS — W19XXXA Unspecified fall, initial encounter: Secondary | ICD-10-CM | POA: Diagnosis not present

## 2018-11-22 DIAGNOSIS — B964 Proteus (mirabilis) (morganii) as the cause of diseases classified elsewhere: Secondary | ICD-10-CM | POA: Diagnosis not present

## 2018-11-22 DIAGNOSIS — L97522 Non-pressure chronic ulcer of other part of left foot with fat layer exposed: Secondary | ICD-10-CM | POA: Diagnosis not present

## 2018-11-22 DIAGNOSIS — Z9181 History of falling: Secondary | ICD-10-CM | POA: Diagnosis not present

## 2018-11-22 DIAGNOSIS — F329 Major depressive disorder, single episode, unspecified: Secondary | ICD-10-CM | POA: Diagnosis not present

## 2018-11-22 DIAGNOSIS — M869 Osteomyelitis, unspecified: Secondary | ICD-10-CM | POA: Diagnosis not present

## 2018-11-22 DIAGNOSIS — B952 Enterococcus as the cause of diseases classified elsewhere: Secondary | ICD-10-CM | POA: Diagnosis not present

## 2018-11-22 DIAGNOSIS — L97424 Non-pressure chronic ulcer of left heel and midfoot with necrosis of bone: Secondary | ICD-10-CM | POA: Diagnosis not present

## 2018-11-24 DIAGNOSIS — S91312A Laceration without foreign body, left foot, initial encounter: Secondary | ICD-10-CM | POA: Diagnosis not present

## 2018-11-24 DIAGNOSIS — L97522 Non-pressure chronic ulcer of other part of left foot with fat layer exposed: Secondary | ICD-10-CM | POA: Diagnosis not present

## 2018-11-27 DIAGNOSIS — B964 Proteus (mirabilis) (morganii) as the cause of diseases classified elsewhere: Secondary | ICD-10-CM | POA: Diagnosis not present

## 2018-11-27 DIAGNOSIS — W19XXXA Unspecified fall, initial encounter: Secondary | ICD-10-CM | POA: Diagnosis not present

## 2018-11-27 DIAGNOSIS — L97522 Non-pressure chronic ulcer of other part of left foot with fat layer exposed: Secondary | ICD-10-CM | POA: Diagnosis not present

## 2018-11-27 DIAGNOSIS — F329 Major depressive disorder, single episode, unspecified: Secondary | ICD-10-CM | POA: Diagnosis not present

## 2018-11-27 DIAGNOSIS — Z9181 History of falling: Secondary | ICD-10-CM | POA: Diagnosis not present

## 2018-11-27 DIAGNOSIS — M869 Osteomyelitis, unspecified: Secondary | ICD-10-CM | POA: Diagnosis not present

## 2018-11-27 DIAGNOSIS — B952 Enterococcus as the cause of diseases classified elsewhere: Secondary | ICD-10-CM | POA: Diagnosis not present

## 2018-11-27 DIAGNOSIS — L97424 Non-pressure chronic ulcer of left heel and midfoot with necrosis of bone: Secondary | ICD-10-CM | POA: Diagnosis not present

## 2018-11-27 DIAGNOSIS — E11621 Type 2 diabetes mellitus with foot ulcer: Secondary | ICD-10-CM | POA: Diagnosis not present

## 2018-11-30 DIAGNOSIS — M86272 Subacute osteomyelitis, left ankle and foot: Secondary | ICD-10-CM | POA: Diagnosis not present

## 2018-12-04 DIAGNOSIS — L97424 Non-pressure chronic ulcer of left heel and midfoot with necrosis of bone: Secondary | ICD-10-CM | POA: Diagnosis not present

## 2018-12-04 DIAGNOSIS — W19XXXA Unspecified fall, initial encounter: Secondary | ICD-10-CM | POA: Diagnosis not present

## 2018-12-04 DIAGNOSIS — B964 Proteus (mirabilis) (morganii) as the cause of diseases classified elsewhere: Secondary | ICD-10-CM | POA: Diagnosis not present

## 2018-12-04 DIAGNOSIS — F329 Major depressive disorder, single episode, unspecified: Secondary | ICD-10-CM | POA: Diagnosis not present

## 2018-12-04 DIAGNOSIS — E11621 Type 2 diabetes mellitus with foot ulcer: Secondary | ICD-10-CM | POA: Diagnosis not present

## 2018-12-04 DIAGNOSIS — B952 Enterococcus as the cause of diseases classified elsewhere: Secondary | ICD-10-CM | POA: Diagnosis not present

## 2018-12-04 DIAGNOSIS — L97522 Non-pressure chronic ulcer of other part of left foot with fat layer exposed: Secondary | ICD-10-CM | POA: Diagnosis not present

## 2018-12-04 DIAGNOSIS — Z9181 History of falling: Secondary | ICD-10-CM | POA: Diagnosis not present

## 2018-12-06 DIAGNOSIS — Z792 Long term (current) use of antibiotics: Secondary | ICD-10-CM | POA: Diagnosis not present

## 2018-12-06 DIAGNOSIS — M86272 Subacute osteomyelitis, left ankle and foot: Secondary | ICD-10-CM | POA: Diagnosis not present

## 2018-12-07 DIAGNOSIS — M86672 Other chronic osteomyelitis, left ankle and foot: Secondary | ICD-10-CM | POA: Diagnosis not present

## 2018-12-07 DIAGNOSIS — L97424 Non-pressure chronic ulcer of left heel and midfoot with necrosis of bone: Secondary | ICD-10-CM | POA: Diagnosis not present

## 2018-12-07 DIAGNOSIS — E11621 Type 2 diabetes mellitus with foot ulcer: Secondary | ICD-10-CM | POA: Diagnosis not present

## 2018-12-07 DIAGNOSIS — L97529 Non-pressure chronic ulcer of other part of left foot with unspecified severity: Secondary | ICD-10-CM | POA: Diagnosis not present

## 2018-12-11 DIAGNOSIS — L97424 Non-pressure chronic ulcer of left heel and midfoot with necrosis of bone: Secondary | ICD-10-CM | POA: Diagnosis not present

## 2018-12-11 DIAGNOSIS — L97522 Non-pressure chronic ulcer of other part of left foot with fat layer exposed: Secondary | ICD-10-CM | POA: Diagnosis not present

## 2018-12-11 DIAGNOSIS — B964 Proteus (mirabilis) (morganii) as the cause of diseases classified elsewhere: Secondary | ICD-10-CM | POA: Diagnosis not present

## 2018-12-11 DIAGNOSIS — B952 Enterococcus as the cause of diseases classified elsewhere: Secondary | ICD-10-CM | POA: Diagnosis not present

## 2018-12-11 DIAGNOSIS — W19XXXA Unspecified fall, initial encounter: Secondary | ICD-10-CM | POA: Diagnosis not present

## 2018-12-11 DIAGNOSIS — Z9181 History of falling: Secondary | ICD-10-CM | POA: Diagnosis not present

## 2018-12-11 DIAGNOSIS — F329 Major depressive disorder, single episode, unspecified: Secondary | ICD-10-CM | POA: Diagnosis not present

## 2018-12-11 DIAGNOSIS — E11621 Type 2 diabetes mellitus with foot ulcer: Secondary | ICD-10-CM | POA: Diagnosis not present

## 2018-12-18 DIAGNOSIS — L97522 Non-pressure chronic ulcer of other part of left foot with fat layer exposed: Secondary | ICD-10-CM | POA: Diagnosis not present

## 2018-12-18 DIAGNOSIS — W19XXXA Unspecified fall, initial encounter: Secondary | ICD-10-CM | POA: Diagnosis not present

## 2018-12-18 DIAGNOSIS — L97424 Non-pressure chronic ulcer of left heel and midfoot with necrosis of bone: Secondary | ICD-10-CM | POA: Diagnosis not present

## 2018-12-18 DIAGNOSIS — F329 Major depressive disorder, single episode, unspecified: Secondary | ICD-10-CM | POA: Diagnosis not present

## 2018-12-18 DIAGNOSIS — B952 Enterococcus as the cause of diseases classified elsewhere: Secondary | ICD-10-CM | POA: Diagnosis not present

## 2018-12-18 DIAGNOSIS — E11621 Type 2 diabetes mellitus with foot ulcer: Secondary | ICD-10-CM | POA: Diagnosis not present

## 2018-12-18 DIAGNOSIS — Z9181 History of falling: Secondary | ICD-10-CM | POA: Diagnosis not present

## 2018-12-18 DIAGNOSIS — B964 Proteus (mirabilis) (morganii) as the cause of diseases classified elsewhere: Secondary | ICD-10-CM | POA: Diagnosis not present

## 2018-12-21 DIAGNOSIS — E1142 Type 2 diabetes mellitus with diabetic polyneuropathy: Secondary | ICD-10-CM | POA: Diagnosis not present

## 2018-12-21 DIAGNOSIS — Z794 Long term (current) use of insulin: Secondary | ICD-10-CM | POA: Diagnosis not present

## 2018-12-22 DIAGNOSIS — Z792 Long term (current) use of antibiotics: Secondary | ICD-10-CM | POA: Diagnosis not present

## 2018-12-22 DIAGNOSIS — M86272 Subacute osteomyelitis, left ankle and foot: Secondary | ICD-10-CM | POA: Diagnosis not present

## 2018-12-22 DIAGNOSIS — Z452 Encounter for adjustment and management of vascular access device: Secondary | ICD-10-CM | POA: Diagnosis not present

## 2018-12-27 DIAGNOSIS — Z9181 History of falling: Secondary | ICD-10-CM | POA: Diagnosis not present

## 2018-12-27 DIAGNOSIS — L97424 Non-pressure chronic ulcer of left heel and midfoot with necrosis of bone: Secondary | ICD-10-CM | POA: Diagnosis not present

## 2018-12-27 DIAGNOSIS — E11621 Type 2 diabetes mellitus with foot ulcer: Secondary | ICD-10-CM | POA: Diagnosis not present

## 2018-12-27 DIAGNOSIS — L97522 Non-pressure chronic ulcer of other part of left foot with fat layer exposed: Secondary | ICD-10-CM | POA: Diagnosis not present

## 2018-12-28 DIAGNOSIS — L97424 Non-pressure chronic ulcer of left heel and midfoot with necrosis of bone: Secondary | ICD-10-CM | POA: Diagnosis not present

## 2018-12-28 DIAGNOSIS — Z9889 Other specified postprocedural states: Secondary | ICD-10-CM | POA: Diagnosis not present

## 2018-12-28 DIAGNOSIS — L97529 Non-pressure chronic ulcer of other part of left foot with unspecified severity: Secondary | ICD-10-CM | POA: Diagnosis not present

## 2018-12-28 DIAGNOSIS — E11621 Type 2 diabetes mellitus with foot ulcer: Secondary | ICD-10-CM | POA: Diagnosis not present

## 2019-01-04 DIAGNOSIS — E11621 Type 2 diabetes mellitus with foot ulcer: Secondary | ICD-10-CM | POA: Diagnosis not present

## 2019-01-04 DIAGNOSIS — L97529 Non-pressure chronic ulcer of other part of left foot with unspecified severity: Secondary | ICD-10-CM | POA: Diagnosis not present

## 2019-01-11 DIAGNOSIS — L97529 Non-pressure chronic ulcer of other part of left foot with unspecified severity: Secondary | ICD-10-CM | POA: Diagnosis not present

## 2019-01-11 DIAGNOSIS — E11621 Type 2 diabetes mellitus with foot ulcer: Secondary | ICD-10-CM | POA: Diagnosis not present

## 2019-01-25 DIAGNOSIS — E11621 Type 2 diabetes mellitus with foot ulcer: Secondary | ICD-10-CM | POA: Diagnosis not present

## 2019-01-25 DIAGNOSIS — L97424 Non-pressure chronic ulcer of left heel and midfoot with necrosis of bone: Secondary | ICD-10-CM | POA: Diagnosis not present

## 2019-01-27 DIAGNOSIS — L97522 Non-pressure chronic ulcer of other part of left foot with fat layer exposed: Secondary | ICD-10-CM | POA: Diagnosis not present

## 2019-01-27 DIAGNOSIS — L97424 Non-pressure chronic ulcer of left heel and midfoot with necrosis of bone: Secondary | ICD-10-CM | POA: Diagnosis not present

## 2019-01-27 DIAGNOSIS — E11621 Type 2 diabetes mellitus with foot ulcer: Secondary | ICD-10-CM | POA: Diagnosis not present

## 2019-01-27 DIAGNOSIS — Z9181 History of falling: Secondary | ICD-10-CM | POA: Diagnosis not present

## 2019-02-14 DIAGNOSIS — B372 Candidiasis of skin and nail: Secondary | ICD-10-CM | POA: Diagnosis not present

## 2019-02-14 DIAGNOSIS — H109 Unspecified conjunctivitis: Secondary | ICD-10-CM | POA: Diagnosis not present

## 2019-02-14 DIAGNOSIS — E162 Hypoglycemia, unspecified: Secondary | ICD-10-CM | POA: Diagnosis not present

## 2019-02-14 DIAGNOSIS — E1142 Type 2 diabetes mellitus with diabetic polyneuropathy: Secondary | ICD-10-CM | POA: Diagnosis not present

## 2019-02-14 DIAGNOSIS — I951 Orthostatic hypotension: Secondary | ICD-10-CM | POA: Diagnosis not present

## 2019-02-14 DIAGNOSIS — E2740 Unspecified adrenocortical insufficiency: Secondary | ICD-10-CM | POA: Diagnosis not present

## 2019-02-27 DIAGNOSIS — L97424 Non-pressure chronic ulcer of left heel and midfoot with necrosis of bone: Secondary | ICD-10-CM | POA: Diagnosis not present

## 2019-02-27 DIAGNOSIS — Z9181 History of falling: Secondary | ICD-10-CM | POA: Diagnosis not present

## 2019-02-27 DIAGNOSIS — E11621 Type 2 diabetes mellitus with foot ulcer: Secondary | ICD-10-CM | POA: Diagnosis not present

## 2019-02-27 DIAGNOSIS — L97522 Non-pressure chronic ulcer of other part of left foot with fat layer exposed: Secondary | ICD-10-CM | POA: Diagnosis not present

## 2019-03-22 DIAGNOSIS — E11621 Type 2 diabetes mellitus with foot ulcer: Secondary | ICD-10-CM | POA: Diagnosis not present

## 2019-03-22 DIAGNOSIS — M25375 Other instability, left foot: Secondary | ICD-10-CM | POA: Diagnosis not present

## 2019-03-22 DIAGNOSIS — L97529 Non-pressure chronic ulcer of other part of left foot with unspecified severity: Secondary | ICD-10-CM | POA: Diagnosis not present

## 2019-03-27 DIAGNOSIS — E11621 Type 2 diabetes mellitus with foot ulcer: Secondary | ICD-10-CM | POA: Diagnosis not present

## 2019-03-27 DIAGNOSIS — L97522 Non-pressure chronic ulcer of other part of left foot with fat layer exposed: Secondary | ICD-10-CM | POA: Diagnosis not present

## 2019-03-27 DIAGNOSIS — Z9181 History of falling: Secondary | ICD-10-CM | POA: Diagnosis not present

## 2019-03-27 DIAGNOSIS — L97424 Non-pressure chronic ulcer of left heel and midfoot with necrosis of bone: Secondary | ICD-10-CM | POA: Diagnosis not present

## 2019-04-12 DIAGNOSIS — E11621 Type 2 diabetes mellitus with foot ulcer: Secondary | ICD-10-CM | POA: Diagnosis not present

## 2019-04-12 DIAGNOSIS — L97529 Non-pressure chronic ulcer of other part of left foot with unspecified severity: Secondary | ICD-10-CM | POA: Diagnosis not present

## 2019-04-27 DIAGNOSIS — L97522 Non-pressure chronic ulcer of other part of left foot with fat layer exposed: Secondary | ICD-10-CM | POA: Diagnosis not present

## 2019-04-27 DIAGNOSIS — E11621 Type 2 diabetes mellitus with foot ulcer: Secondary | ICD-10-CM | POA: Diagnosis not present

## 2019-04-27 DIAGNOSIS — Z9181 History of falling: Secondary | ICD-10-CM | POA: Diagnosis not present

## 2019-04-27 DIAGNOSIS — L97424 Non-pressure chronic ulcer of left heel and midfoot with necrosis of bone: Secondary | ICD-10-CM | POA: Diagnosis not present

## 2019-05-27 DIAGNOSIS — L97424 Non-pressure chronic ulcer of left heel and midfoot with necrosis of bone: Secondary | ICD-10-CM | POA: Diagnosis not present

## 2019-05-27 DIAGNOSIS — Z9181 History of falling: Secondary | ICD-10-CM | POA: Diagnosis not present

## 2019-05-27 DIAGNOSIS — E11621 Type 2 diabetes mellitus with foot ulcer: Secondary | ICD-10-CM | POA: Diagnosis not present

## 2019-05-27 DIAGNOSIS — L97522 Non-pressure chronic ulcer of other part of left foot with fat layer exposed: Secondary | ICD-10-CM | POA: Diagnosis not present

## 2019-06-06 DIAGNOSIS — R2689 Other abnormalities of gait and mobility: Secondary | ICD-10-CM | POA: Diagnosis not present

## 2019-06-06 DIAGNOSIS — I83892 Varicose veins of left lower extremities with other complications: Secondary | ICD-10-CM | POA: Diagnosis not present

## 2019-06-06 DIAGNOSIS — I890 Lymphedema, not elsewhere classified: Secondary | ICD-10-CM | POA: Diagnosis not present

## 2019-06-06 DIAGNOSIS — M6702 Short Achilles tendon (acquired), left ankle: Secondary | ICD-10-CM | POA: Diagnosis not present

## 2019-06-07 DIAGNOSIS — M1A30X0 Chronic gout due to renal impairment, unspecified site, without tophus (tophi): Secondary | ICD-10-CM | POA: Diagnosis not present

## 2019-06-07 DIAGNOSIS — E1142 Type 2 diabetes mellitus with diabetic polyneuropathy: Secondary | ICD-10-CM | POA: Diagnosis not present

## 2019-06-07 DIAGNOSIS — N184 Chronic kidney disease, stage 4 (severe): Secondary | ICD-10-CM | POA: Diagnosis not present

## 2019-06-07 DIAGNOSIS — E876 Hypokalemia: Secondary | ICD-10-CM | POA: Diagnosis not present

## 2019-06-07 DIAGNOSIS — I50812 Chronic right heart failure: Secondary | ICD-10-CM | POA: Diagnosis not present

## 2019-06-07 DIAGNOSIS — J449 Chronic obstructive pulmonary disease, unspecified: Secondary | ICD-10-CM | POA: Diagnosis not present

## 2019-06-07 DIAGNOSIS — E1143 Type 2 diabetes mellitus with diabetic autonomic (poly)neuropathy: Secondary | ICD-10-CM | POA: Diagnosis not present

## 2019-06-07 DIAGNOSIS — G8929 Other chronic pain: Secondary | ICD-10-CM | POA: Diagnosis not present

## 2019-06-07 DIAGNOSIS — R7401 Elevation of levels of liver transaminase levels: Secondary | ICD-10-CM | POA: Diagnosis not present

## 2019-06-07 DIAGNOSIS — L97424 Non-pressure chronic ulcer of left heel and midfoot with necrosis of bone: Secondary | ICD-10-CM | POA: Diagnosis not present

## 2019-06-07 DIAGNOSIS — M25512 Pain in left shoulder: Secondary | ICD-10-CM | POA: Diagnosis not present

## 2019-06-07 DIAGNOSIS — S46012A Strain of muscle(s) and tendon(s) of the rotator cuff of left shoulder, initial encounter: Secondary | ICD-10-CM | POA: Diagnosis not present

## 2019-06-07 DIAGNOSIS — E11621 Type 2 diabetes mellitus with foot ulcer: Secondary | ICD-10-CM | POA: Diagnosis not present

## 2019-06-07 DIAGNOSIS — E1139 Type 2 diabetes mellitus with other diabetic ophthalmic complication: Secondary | ICD-10-CM | POA: Diagnosis not present

## 2019-06-07 DIAGNOSIS — E119 Type 2 diabetes mellitus without complications: Secondary | ICD-10-CM | POA: Diagnosis not present

## 2019-06-27 DIAGNOSIS — L97522 Non-pressure chronic ulcer of other part of left foot with fat layer exposed: Secondary | ICD-10-CM | POA: Diagnosis not present

## 2019-06-27 DIAGNOSIS — E11621 Type 2 diabetes mellitus with foot ulcer: Secondary | ICD-10-CM | POA: Diagnosis not present

## 2019-06-27 DIAGNOSIS — L97424 Non-pressure chronic ulcer of left heel and midfoot with necrosis of bone: Secondary | ICD-10-CM | POA: Diagnosis not present

## 2019-06-27 DIAGNOSIS — Z9181 History of falling: Secondary | ICD-10-CM | POA: Diagnosis not present

## 2019-06-29 DIAGNOSIS — S8002XA Contusion of left knee, initial encounter: Secondary | ICD-10-CM | POA: Diagnosis not present

## 2019-07-27 DIAGNOSIS — L97424 Non-pressure chronic ulcer of left heel and midfoot with necrosis of bone: Secondary | ICD-10-CM | POA: Diagnosis not present

## 2019-07-27 DIAGNOSIS — E11621 Type 2 diabetes mellitus with foot ulcer: Secondary | ICD-10-CM | POA: Diagnosis not present

## 2019-07-27 DIAGNOSIS — L97522 Non-pressure chronic ulcer of other part of left foot with fat layer exposed: Secondary | ICD-10-CM | POA: Diagnosis not present

## 2019-07-27 DIAGNOSIS — Z9181 History of falling: Secondary | ICD-10-CM | POA: Diagnosis not present

## 2019-08-18 DIAGNOSIS — R7401 Elevation of levels of liver transaminase levels: Secondary | ICD-10-CM | POA: Diagnosis not present

## 2019-08-18 DIAGNOSIS — E1142 Type 2 diabetes mellitus with diabetic polyneuropathy: Secondary | ICD-10-CM | POA: Diagnosis not present

## 2019-08-18 DIAGNOSIS — M1A30X0 Chronic gout due to renal impairment, unspecified site, without tophus (tophi): Secondary | ICD-10-CM | POA: Diagnosis not present

## 2019-08-18 DIAGNOSIS — E876 Hypokalemia: Secondary | ICD-10-CM | POA: Diagnosis not present

## 2019-08-21 DIAGNOSIS — I10 Essential (primary) hypertension: Secondary | ICD-10-CM | POA: Diagnosis not present

## 2019-08-21 DIAGNOSIS — E1142 Type 2 diabetes mellitus with diabetic polyneuropathy: Secondary | ICD-10-CM | POA: Diagnosis not present

## 2019-08-21 DIAGNOSIS — I50812 Chronic right heart failure: Secondary | ICD-10-CM | POA: Diagnosis not present

## 2019-08-21 DIAGNOSIS — M1A3720 Chronic gout due to renal impairment, left ankle and foot, without tophus (tophi): Secondary | ICD-10-CM | POA: Diagnosis not present

## 2019-08-21 DIAGNOSIS — E1143 Type 2 diabetes mellitus with diabetic autonomic (poly)neuropathy: Secondary | ICD-10-CM | POA: Diagnosis not present

## 2019-08-21 DIAGNOSIS — N184 Chronic kidney disease, stage 4 (severe): Secondary | ICD-10-CM | POA: Diagnosis not present

## 2019-08-21 DIAGNOSIS — E034 Atrophy of thyroid (acquired): Secondary | ICD-10-CM | POA: Diagnosis not present

## 2019-08-21 DIAGNOSIS — Z23 Encounter for immunization: Secondary | ICD-10-CM | POA: Diagnosis not present

## 2019-08-21 DIAGNOSIS — E611 Iron deficiency: Secondary | ICD-10-CM | POA: Diagnosis not present

## 2019-08-21 DIAGNOSIS — D61818 Other pancytopenia: Secondary | ICD-10-CM | POA: Diagnosis not present

## 2019-08-21 DIAGNOSIS — R600 Localized edema: Secondary | ICD-10-CM | POA: Diagnosis not present

## 2019-08-22 DIAGNOSIS — I50812 Chronic right heart failure: Secondary | ICD-10-CM | POA: Insufficient documentation

## 2019-08-27 DIAGNOSIS — L97522 Non-pressure chronic ulcer of other part of left foot with fat layer exposed: Secondary | ICD-10-CM | POA: Diagnosis not present

## 2019-08-27 DIAGNOSIS — E11621 Type 2 diabetes mellitus with foot ulcer: Secondary | ICD-10-CM | POA: Diagnosis not present

## 2019-08-27 DIAGNOSIS — Z9181 History of falling: Secondary | ICD-10-CM | POA: Diagnosis not present

## 2019-08-27 DIAGNOSIS — L97424 Non-pressure chronic ulcer of left heel and midfoot with necrosis of bone: Secondary | ICD-10-CM | POA: Diagnosis not present

## 2019-08-30 DIAGNOSIS — Z7982 Long term (current) use of aspirin: Secondary | ICD-10-CM | POA: Diagnosis not present

## 2019-08-30 DIAGNOSIS — M109 Gout, unspecified: Secondary | ICD-10-CM | POA: Diagnosis not present

## 2019-08-30 DIAGNOSIS — R590 Localized enlarged lymph nodes: Secondary | ICD-10-CM | POA: Diagnosis not present

## 2019-08-30 DIAGNOSIS — G4730 Sleep apnea, unspecified: Secondary | ICD-10-CM | POA: Diagnosis not present

## 2019-08-30 DIAGNOSIS — E1122 Type 2 diabetes mellitus with diabetic chronic kidney disease: Secondary | ICD-10-CM | POA: Diagnosis not present

## 2019-08-30 DIAGNOSIS — I130 Hypertensive heart and chronic kidney disease with heart failure and stage 1 through stage 4 chronic kidney disease, or unspecified chronic kidney disease: Secondary | ICD-10-CM | POA: Diagnosis not present

## 2019-08-30 DIAGNOSIS — N184 Chronic kidney disease, stage 4 (severe): Secondary | ICD-10-CM | POA: Diagnosis not present

## 2019-08-30 DIAGNOSIS — J45909 Unspecified asthma, uncomplicated: Secondary | ICD-10-CM | POA: Diagnosis not present

## 2019-08-30 DIAGNOSIS — I890 Lymphedema, not elsewhere classified: Secondary | ICD-10-CM | POA: Diagnosis not present

## 2019-08-30 DIAGNOSIS — K219 Gastro-esophageal reflux disease without esophagitis: Secondary | ICD-10-CM | POA: Diagnosis not present

## 2019-08-30 DIAGNOSIS — M79605 Pain in left leg: Secondary | ICD-10-CM | POA: Diagnosis not present

## 2019-08-30 DIAGNOSIS — I83892 Varicose veins of left lower extremities with other complications: Secondary | ICD-10-CM | POA: Diagnosis not present

## 2019-08-30 DIAGNOSIS — K760 Fatty (change of) liver, not elsewhere classified: Secondary | ICD-10-CM | POA: Diagnosis not present

## 2019-08-30 DIAGNOSIS — E079 Disorder of thyroid, unspecified: Secondary | ICD-10-CM | POA: Diagnosis not present

## 2019-08-30 DIAGNOSIS — I5022 Chronic systolic (congestive) heart failure: Secondary | ICD-10-CM | POA: Diagnosis not present

## 2019-09-04 DIAGNOSIS — M109 Gout, unspecified: Secondary | ICD-10-CM | POA: Diagnosis not present

## 2019-09-04 DIAGNOSIS — E1121 Type 2 diabetes mellitus with diabetic nephropathy: Secondary | ICD-10-CM | POA: Diagnosis not present

## 2019-09-04 DIAGNOSIS — I129 Hypertensive chronic kidney disease with stage 1 through stage 4 chronic kidney disease, or unspecified chronic kidney disease: Secondary | ICD-10-CM | POA: Diagnosis not present

## 2019-09-04 DIAGNOSIS — N184 Chronic kidney disease, stage 4 (severe): Secondary | ICD-10-CM | POA: Diagnosis not present

## 2019-09-19 DIAGNOSIS — I129 Hypertensive chronic kidney disease with stage 1 through stage 4 chronic kidney disease, or unspecified chronic kidney disease: Secondary | ICD-10-CM | POA: Diagnosis not present

## 2019-09-19 DIAGNOSIS — N184 Chronic kidney disease, stage 4 (severe): Secondary | ICD-10-CM | POA: Diagnosis not present

## 2019-09-19 DIAGNOSIS — E1121 Type 2 diabetes mellitus with diabetic nephropathy: Secondary | ICD-10-CM | POA: Diagnosis not present

## 2019-09-19 DIAGNOSIS — N281 Cyst of kidney, acquired: Secondary | ICD-10-CM | POA: Diagnosis not present

## 2019-09-19 DIAGNOSIS — E1122 Type 2 diabetes mellitus with diabetic chronic kidney disease: Secondary | ICD-10-CM | POA: Diagnosis not present

## 2019-09-19 DIAGNOSIS — R935 Abnormal findings on diagnostic imaging of other abdominal regions, including retroperitoneum: Secondary | ICD-10-CM | POA: Diagnosis not present

## 2019-09-27 DIAGNOSIS — L97529 Non-pressure chronic ulcer of other part of left foot with unspecified severity: Secondary | ICD-10-CM | POA: Diagnosis not present

## 2019-09-27 DIAGNOSIS — E11621 Type 2 diabetes mellitus with foot ulcer: Secondary | ICD-10-CM | POA: Diagnosis not present

## 2019-09-27 DIAGNOSIS — Z872 Personal history of diseases of the skin and subcutaneous tissue: Secondary | ICD-10-CM | POA: Diagnosis not present

## 2019-09-27 DIAGNOSIS — L97522 Non-pressure chronic ulcer of other part of left foot with fat layer exposed: Secondary | ICD-10-CM | POA: Diagnosis not present

## 2019-09-27 DIAGNOSIS — L97424 Non-pressure chronic ulcer of left heel and midfoot with necrosis of bone: Secondary | ICD-10-CM | POA: Diagnosis not present

## 2019-09-27 DIAGNOSIS — M25375 Other instability, left foot: Secondary | ICD-10-CM | POA: Diagnosis not present

## 2019-09-27 DIAGNOSIS — Z9181 History of falling: Secondary | ICD-10-CM | POA: Diagnosis not present

## 2019-10-27 DIAGNOSIS — Z9181 History of falling: Secondary | ICD-10-CM | POA: Diagnosis not present

## 2019-10-27 DIAGNOSIS — E11621 Type 2 diabetes mellitus with foot ulcer: Secondary | ICD-10-CM | POA: Diagnosis not present

## 2019-10-27 DIAGNOSIS — L97424 Non-pressure chronic ulcer of left heel and midfoot with necrosis of bone: Secondary | ICD-10-CM | POA: Diagnosis not present

## 2019-10-27 DIAGNOSIS — L97522 Non-pressure chronic ulcer of other part of left foot with fat layer exposed: Secondary | ICD-10-CM | POA: Diagnosis not present

## 2019-11-22 DIAGNOSIS — E11621 Type 2 diabetes mellitus with foot ulcer: Secondary | ICD-10-CM | POA: Diagnosis not present

## 2019-11-22 DIAGNOSIS — L97424 Non-pressure chronic ulcer of left heel and midfoot with necrosis of bone: Secondary | ICD-10-CM | POA: Diagnosis not present

## 2019-11-22 DIAGNOSIS — E1143 Type 2 diabetes mellitus with diabetic autonomic (poly)neuropathy: Secondary | ICD-10-CM | POA: Diagnosis not present

## 2019-11-22 DIAGNOSIS — E1142 Type 2 diabetes mellitus with diabetic polyneuropathy: Secondary | ICD-10-CM | POA: Diagnosis not present

## 2019-11-22 DIAGNOSIS — E034 Atrophy of thyroid (acquired): Secondary | ICD-10-CM | POA: Diagnosis not present

## 2019-11-22 DIAGNOSIS — N184 Chronic kidney disease, stage 4 (severe): Secondary | ICD-10-CM | POA: Diagnosis not present

## 2019-11-22 DIAGNOSIS — Z23 Encounter for immunization: Secondary | ICD-10-CM | POA: Diagnosis not present

## 2019-11-22 DIAGNOSIS — D61818 Other pancytopenia: Secondary | ICD-10-CM | POA: Diagnosis not present

## 2019-11-22 DIAGNOSIS — D649 Anemia, unspecified: Secondary | ICD-10-CM | POA: Diagnosis not present

## 2020-03-08 DIAGNOSIS — F331 Major depressive disorder, recurrent, moderate: Secondary | ICD-10-CM | POA: Insufficient documentation

## 2020-03-16 IMAGING — DX LEFT FOOT - COMPLETE 3+ VIEW
3 series · 3 of 3 positions shown · non-contrast
Comparison: No comparison studies available.

CLINICAL DATA: LEFT FOOT SWELLING.

EXAM:
LEFT FOOT - COMPLETE 3+ VIEW

[foot ap]
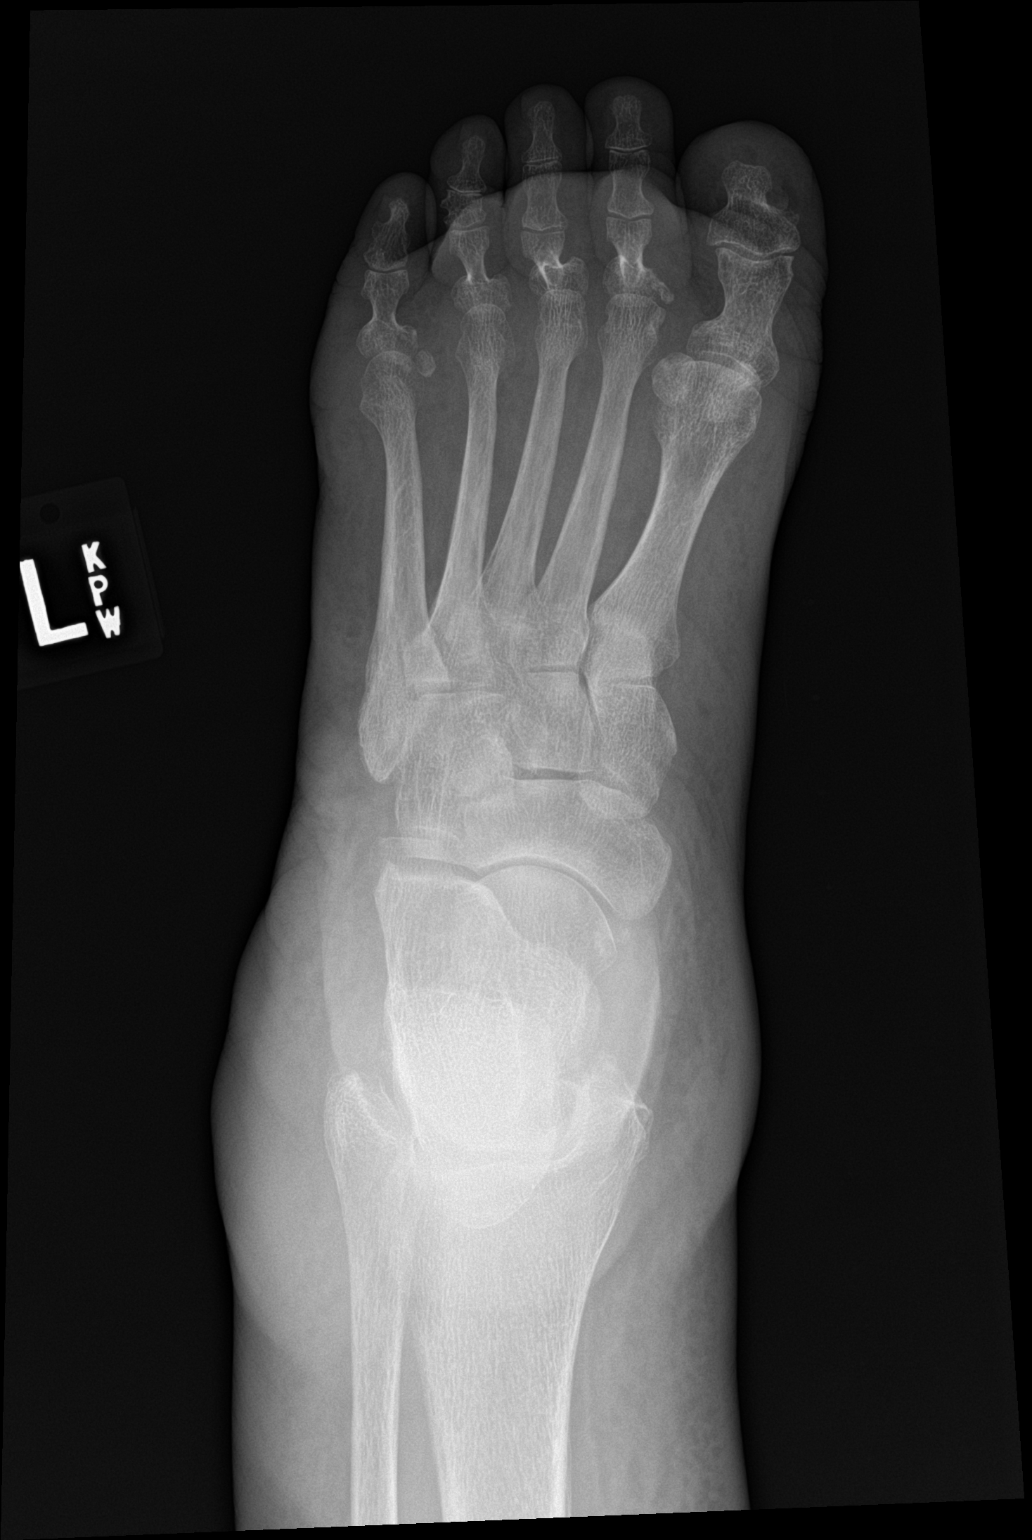

[foot obl]
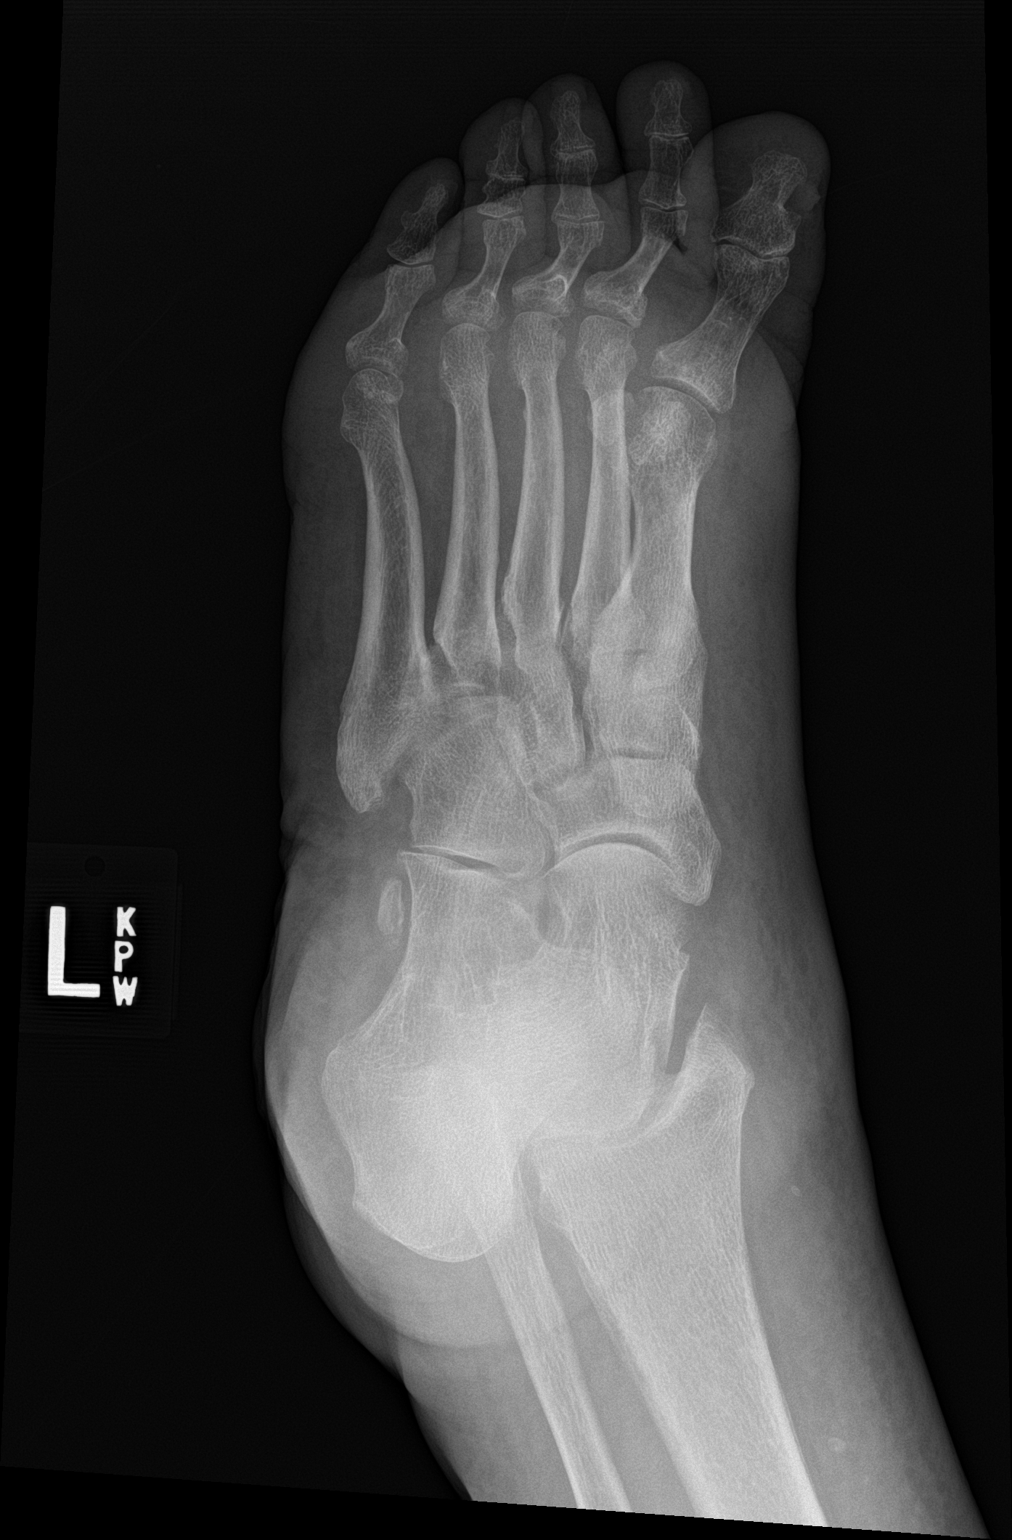

[foot lat]
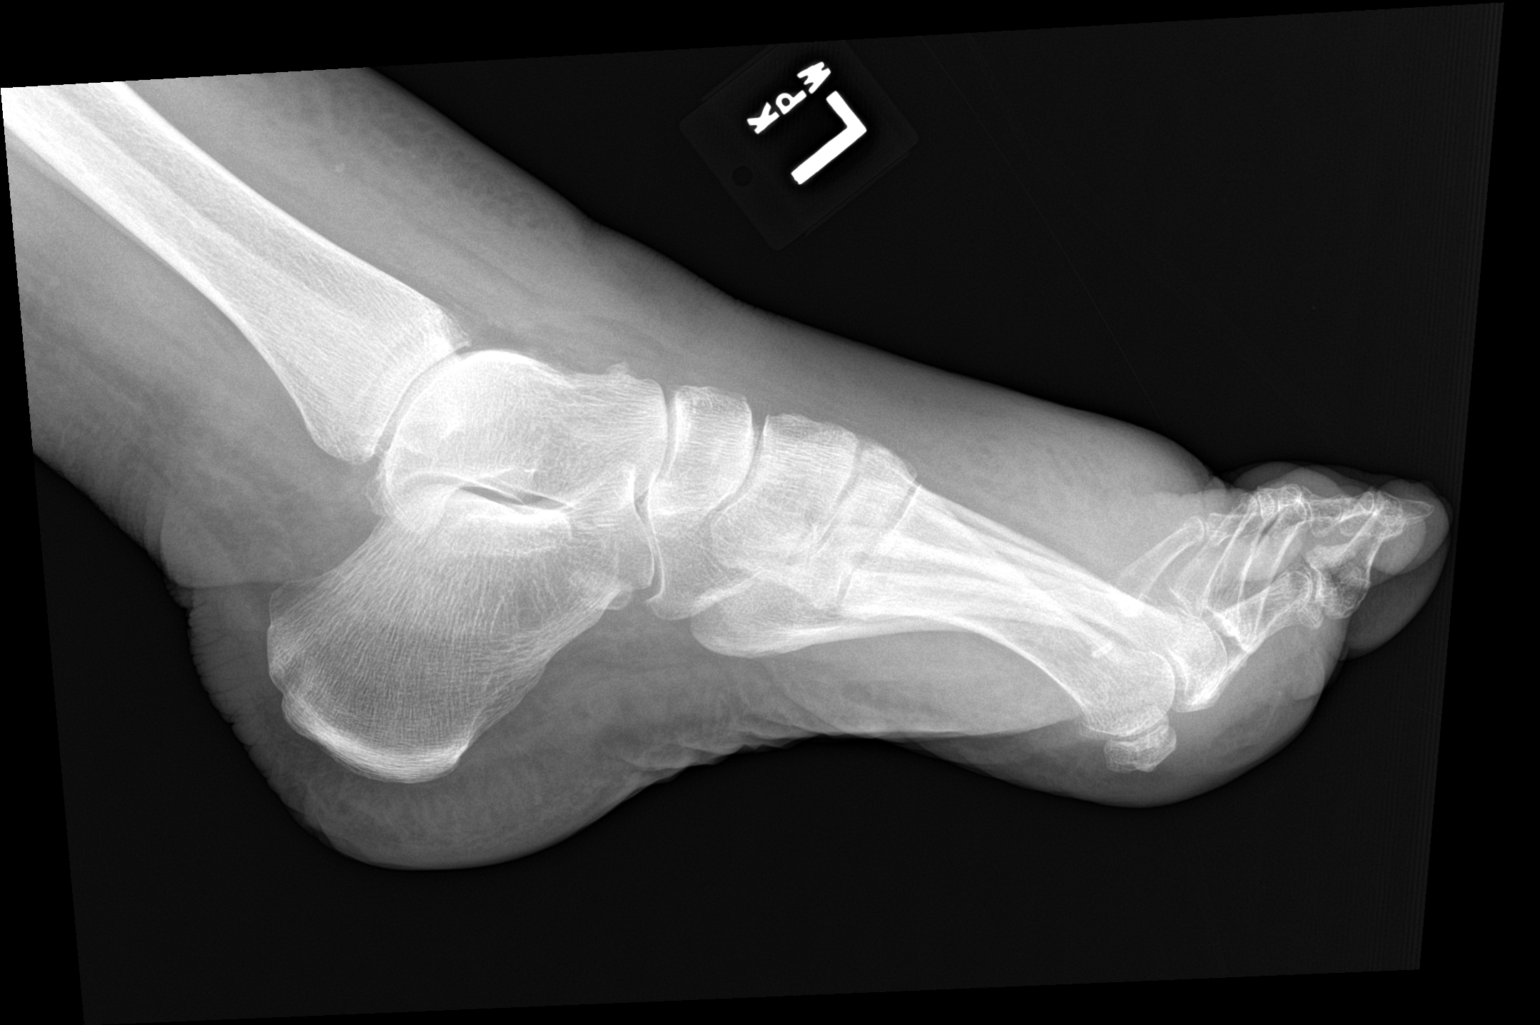

[3 of 3 positions shown; findings below may reference images not displayed]

FINDINGS: Deformity at the base of the second toe proximal phalanx may be
related to previous trauma, age indeterminate. No other evidence for
an acute fracture. No subluxation or dislocation. No worrisome lytic
or sclerotic osseous abnormality. Soft tissue swelling noted distal
foot.
IMPRESSION: Deformity at the base of the second toe proximal phalanx likely
posttraumatic although age indeterminate in appearance.

Soft tissue swelling noted distal foot.

## 2020-05-20 DIAGNOSIS — E8770 Fluid overload, unspecified: Secondary | ICD-10-CM | POA: Insufficient documentation

## 2020-06-29 DIAGNOSIS — L97429 Non-pressure chronic ulcer of left heel and midfoot with unspecified severity: Secondary | ICD-10-CM | POA: Insufficient documentation

## 2020-11-09 ENCOUNTER — Emergency Department: Payer: Medicare (Managed Care)

## 2020-11-09 ENCOUNTER — Inpatient Hospital Stay: Payer: Medicare (Managed Care)

## 2020-11-09 ENCOUNTER — Inpatient Hospital Stay
Admission: EM | Admit: 2020-11-09 | Discharge: 2020-12-21 | DRG: 853 | Disposition: A | Discharge status: Skilled Nursing Facility | Payer: Medicare (Managed Care) | Attending: Internal Medicine | Admitting: Internal Medicine

## 2020-11-09 DIAGNOSIS — D631 Anemia in chronic kidney disease: Secondary | ICD-10-CM | POA: Diagnosis present

## 2020-11-09 DIAGNOSIS — E11621 Type 2 diabetes mellitus with foot ulcer: Secondary | ICD-10-CM | POA: Diagnosis present

## 2020-11-09 DIAGNOSIS — N185 Chronic kidney disease, stage 5: Secondary | ICD-10-CM | POA: Diagnosis not present

## 2020-11-09 DIAGNOSIS — M25532 Pain in left wrist: Secondary | ICD-10-CM | POA: Diagnosis not present

## 2020-11-09 DIAGNOSIS — D702 Other drug-induced agranulocytosis: Secondary | ICD-10-CM | POA: Diagnosis not present

## 2020-11-09 DIAGNOSIS — M009 Pyogenic arthritis, unspecified: Secondary | ICD-10-CM | POA: Diagnosis present

## 2020-11-09 DIAGNOSIS — Z4659 Encounter for fitting and adjustment of other gastrointestinal appliance and device: Secondary | ICD-10-CM

## 2020-11-09 DIAGNOSIS — L7622 Postprocedural hemorrhage and hematoma of skin and subcutaneous tissue following other procedure: Secondary | ICD-10-CM | POA: Diagnosis not present

## 2020-11-09 DIAGNOSIS — D62 Acute posthemorrhagic anemia: Secondary | ICD-10-CM | POA: Diagnosis not present

## 2020-11-09 DIAGNOSIS — E039 Hypothyroidism, unspecified: Secondary | ICD-10-CM | POA: Diagnosis not present

## 2020-11-09 DIAGNOSIS — L97429 Non-pressure chronic ulcer of left heel and midfoot with unspecified severity: Secondary | ICD-10-CM | POA: Diagnosis present

## 2020-11-09 DIAGNOSIS — J690 Pneumonitis due to inhalation of food and vomit: Secondary | ICD-10-CM | POA: Diagnosis not present

## 2020-11-09 DIAGNOSIS — R062 Wheezing: Secondary | ICD-10-CM

## 2020-11-09 DIAGNOSIS — W19XXXA Unspecified fall, initial encounter: Secondary | ICD-10-CM | POA: Diagnosis present

## 2020-11-09 DIAGNOSIS — F32A Depression, unspecified: Secondary | ICD-10-CM | POA: Diagnosis present

## 2020-11-09 DIAGNOSIS — A280 Pasteurellosis: Secondary | ICD-10-CM | POA: Diagnosis present

## 2020-11-09 DIAGNOSIS — I5082 Biventricular heart failure: Secondary | ICD-10-CM | POA: Diagnosis present

## 2020-11-09 DIAGNOSIS — R0603 Acute respiratory distress: Secondary | ICD-10-CM | POA: Diagnosis not present

## 2020-11-09 DIAGNOSIS — R778 Other specified abnormalities of plasma proteins: Secondary | ICD-10-CM | POA: Diagnosis not present

## 2020-11-09 DIAGNOSIS — Z862 Personal history of diseases of the blood and blood-forming organs and certain disorders involving the immune mechanism: Secondary | ICD-10-CM

## 2020-11-09 DIAGNOSIS — B955 Unspecified streptococcus as the cause of diseases classified elsewhere: Secondary | ICD-10-CM | POA: Diagnosis present

## 2020-11-09 DIAGNOSIS — L03119 Cellulitis of unspecified part of limb: Secondary | ICD-10-CM

## 2020-11-09 DIAGNOSIS — I5033 Acute on chronic diastolic (congestive) heart failure: Secondary | ICD-10-CM | POA: Diagnosis not present

## 2020-11-09 DIAGNOSIS — E1169 Type 2 diabetes mellitus with other specified complication: Secondary | ICD-10-CM | POA: Diagnosis present

## 2020-11-09 DIAGNOSIS — Z79899 Other long term (current) drug therapy: Secondary | ICD-10-CM

## 2020-11-09 DIAGNOSIS — E1122 Type 2 diabetes mellitus with diabetic chronic kidney disease: Secondary | ICD-10-CM | POA: Diagnosis present

## 2020-11-09 DIAGNOSIS — D123 Benign neoplasm of transverse colon: Secondary | ICD-10-CM | POA: Diagnosis present

## 2020-11-09 DIAGNOSIS — M869 Osteomyelitis, unspecified: Secondary | ICD-10-CM | POA: Diagnosis not present

## 2020-11-09 DIAGNOSIS — D120 Benign neoplasm of cecum: Secondary | ICD-10-CM | POA: Diagnosis present

## 2020-11-09 DIAGNOSIS — B9561 Methicillin susceptible Staphylococcus aureus infection as the cause of diseases classified elsewhere: Secondary | ICD-10-CM | POA: Diagnosis present

## 2020-11-09 DIAGNOSIS — D124 Benign neoplasm of descending colon: Secondary | ICD-10-CM | POA: Diagnosis present

## 2020-11-09 DIAGNOSIS — G9341 Metabolic encephalopathy: Secondary | ICD-10-CM | POA: Diagnosis present

## 2020-11-09 DIAGNOSIS — K3189 Other diseases of stomach and duodenum: Secondary | ICD-10-CM | POA: Diagnosis present

## 2020-11-09 DIAGNOSIS — I4819 Other persistent atrial fibrillation: Secondary | ICD-10-CM | POA: Diagnosis present

## 2020-11-09 DIAGNOSIS — L02612 Cutaneous abscess of left foot: Secondary | ICD-10-CM | POA: Diagnosis present

## 2020-11-09 DIAGNOSIS — R4701 Aphasia: Secondary | ICD-10-CM | POA: Diagnosis not present

## 2020-11-09 DIAGNOSIS — K2971 Gastritis, unspecified, with bleeding: Secondary | ICD-10-CM | POA: Diagnosis not present

## 2020-11-09 DIAGNOSIS — K640 First degree hemorrhoids: Secondary | ICD-10-CM | POA: Diagnosis present

## 2020-11-09 DIAGNOSIS — K5730 Diverticulosis of large intestine without perforation or abscess without bleeding: Secondary | ICD-10-CM | POA: Diagnosis present

## 2020-11-09 DIAGNOSIS — Z0189 Encounter for other specified special examinations: Secondary | ICD-10-CM

## 2020-11-09 DIAGNOSIS — D638 Anemia in other chronic diseases classified elsewhere: Secondary | ICD-10-CM | POA: Diagnosis not present

## 2020-11-09 DIAGNOSIS — D649 Anemia, unspecified: Secondary | ICD-10-CM | POA: Diagnosis not present

## 2020-11-09 DIAGNOSIS — N289 Disorder of kidney and ureter, unspecified: Secondary | ICD-10-CM

## 2020-11-09 DIAGNOSIS — E876 Hypokalemia: Secondary | ICD-10-CM | POA: Diagnosis present

## 2020-11-09 DIAGNOSIS — I97821 Postprocedural cerebrovascular infarction during other surgery: Secondary | ICD-10-CM | POA: Diagnosis not present

## 2020-11-09 DIAGNOSIS — G4730 Sleep apnea, unspecified: Secondary | ICD-10-CM | POA: Diagnosis present

## 2020-11-09 DIAGNOSIS — R6520 Severe sepsis without septic shock: Secondary | ICD-10-CM | POA: Diagnosis not present

## 2020-11-09 DIAGNOSIS — I4891 Unspecified atrial fibrillation: Secondary | ICD-10-CM

## 2020-11-09 DIAGNOSIS — B3781 Candidal esophagitis: Secondary | ICD-10-CM | POA: Diagnosis not present

## 2020-11-09 DIAGNOSIS — E1140 Type 2 diabetes mellitus with diabetic neuropathy, unspecified: Secondary | ICD-10-CM | POA: Diagnosis present

## 2020-11-09 DIAGNOSIS — I6522 Occlusion and stenosis of left carotid artery: Secondary | ICD-10-CM | POA: Diagnosis not present

## 2020-11-09 DIAGNOSIS — K5900 Constipation, unspecified: Secondary | ICD-10-CM | POA: Diagnosis not present

## 2020-11-09 DIAGNOSIS — G4733 Obstructive sleep apnea (adult) (pediatric): Secondary | ICD-10-CM | POA: Diagnosis present

## 2020-11-09 DIAGNOSIS — L03116 Cellulitis of left lower limb: Secondary | ICD-10-CM | POA: Diagnosis present

## 2020-11-09 DIAGNOSIS — R296 Repeated falls: Secondary | ICD-10-CM | POA: Diagnosis present

## 2020-11-09 DIAGNOSIS — M86172 Other acute osteomyelitis, left ankle and foot: Secondary | ICD-10-CM

## 2020-11-09 DIAGNOSIS — D708 Other neutropenia: Secondary | ICD-10-CM

## 2020-11-09 DIAGNOSIS — Z515 Encounter for palliative care: Secondary | ICD-10-CM | POA: Diagnosis not present

## 2020-11-09 DIAGNOSIS — G8191 Hemiplegia, unspecified affecting right dominant side: Secondary | ICD-10-CM | POA: Diagnosis not present

## 2020-11-09 DIAGNOSIS — J9601 Acute respiratory failure with hypoxia: Secondary | ICD-10-CM | POA: Diagnosis not present

## 2020-11-09 DIAGNOSIS — R443 Hallucinations, unspecified: Secondary | ICD-10-CM | POA: Diagnosis not present

## 2020-11-09 DIAGNOSIS — R5381 Other malaise: Secondary | ICD-10-CM | POA: Diagnosis not present

## 2020-11-09 DIAGNOSIS — R7989 Other specified abnormal findings of blood chemistry: Secondary | ICD-10-CM | POA: Diagnosis not present

## 2020-11-09 DIAGNOSIS — Z789 Other specified health status: Secondary | ICD-10-CM | POA: Diagnosis not present

## 2020-11-09 DIAGNOSIS — N184 Chronic kidney disease, stage 4 (severe): Secondary | ICD-10-CM | POA: Diagnosis present

## 2020-11-09 DIAGNOSIS — M6510 Other infective (teno)synovitis, unspecified site: Secondary | ICD-10-CM

## 2020-11-09 DIAGNOSIS — T45515A Adverse effect of anticoagulants, initial encounter: Secondary | ICD-10-CM | POA: Diagnosis not present

## 2020-11-09 DIAGNOSIS — Q2112 Patent foramen ovale: Secondary | ICD-10-CM

## 2020-11-09 DIAGNOSIS — R109 Unspecified abdominal pain: Secondary | ICD-10-CM

## 2020-11-09 DIAGNOSIS — Y838 Other surgical procedures as the cause of abnormal reaction of the patient, or of later complication, without mention of misadventure at the time of the procedure: Secondary | ICD-10-CM | POA: Diagnosis not present

## 2020-11-09 DIAGNOSIS — R55 Syncope and collapse: Secondary | ICD-10-CM

## 2020-11-09 DIAGNOSIS — R29810 Facial weakness: Secondary | ICD-10-CM | POA: Diagnosis not present

## 2020-11-09 DIAGNOSIS — N179 Acute kidney failure, unspecified: Secondary | ICD-10-CM | POA: Diagnosis not present

## 2020-11-09 DIAGNOSIS — N189 Chronic kidney disease, unspecified: Secondary | ICD-10-CM | POA: Diagnosis not present

## 2020-11-09 DIAGNOSIS — E11628 Type 2 diabetes mellitus with other skin complications: Secondary | ICD-10-CM | POA: Diagnosis present

## 2020-11-09 DIAGNOSIS — R001 Bradycardia, unspecified: Secondary | ICD-10-CM | POA: Diagnosis not present

## 2020-11-09 DIAGNOSIS — A419 Sepsis, unspecified organism: Principal | ICD-10-CM | POA: Diagnosis present

## 2020-11-09 DIAGNOSIS — R0902 Hypoxemia: Secondary | ICD-10-CM

## 2020-11-09 DIAGNOSIS — E1151 Type 2 diabetes mellitus with diabetic peripheral angiopathy without gangrene: Secondary | ICD-10-CM | POA: Diagnosis present

## 2020-11-09 DIAGNOSIS — I6340 Cerebral infarction due to embolism of unspecified cerebral artery: Secondary | ICD-10-CM | POA: Diagnosis not present

## 2020-11-09 DIAGNOSIS — Z87891 Personal history of nicotine dependence: Secondary | ICD-10-CM

## 2020-11-09 DIAGNOSIS — R471 Dysarthria and anarthria: Secondary | ICD-10-CM | POA: Diagnosis not present

## 2020-11-09 DIAGNOSIS — T502X5A Adverse effect of carbonic-anhydrase inhibitors, benzothiadiazides and other diuretics, initial encounter: Secondary | ICD-10-CM | POA: Diagnosis present

## 2020-11-09 DIAGNOSIS — I639 Cerebral infarction, unspecified: Secondary | ICD-10-CM | POA: Diagnosis not present

## 2020-11-09 DIAGNOSIS — Z79890 Hormone replacement therapy (postmenopausal): Secondary | ICD-10-CM

## 2020-11-09 DIAGNOSIS — T4275XA Adverse effect of unspecified antiepileptic and sedative-hypnotic drugs, initial encounter: Secondary | ICD-10-CM | POA: Diagnosis not present

## 2020-11-09 DIAGNOSIS — T8781 Dehiscence of amputation stump: Secondary | ICD-10-CM | POA: Diagnosis not present

## 2020-11-09 DIAGNOSIS — S2001XA Contusion of right breast, initial encounter: Secondary | ICD-10-CM | POA: Diagnosis present

## 2020-11-09 DIAGNOSIS — I130 Hypertensive heart and chronic kidney disease with heart failure and stage 1 through stage 4 chronic kidney disease, or unspecified chronic kidney disease: Secondary | ICD-10-CM | POA: Diagnosis present

## 2020-11-09 DIAGNOSIS — G8929 Other chronic pain: Secondary | ICD-10-CM | POA: Diagnosis present

## 2020-11-09 DIAGNOSIS — L02619 Cutaneous abscess of unspecified foot: Secondary | ICD-10-CM | POA: Diagnosis not present

## 2020-11-09 DIAGNOSIS — E781 Pure hyperglyceridemia: Secondary | ICD-10-CM | POA: Diagnosis present

## 2020-11-09 DIAGNOSIS — K7581 Nonalcoholic steatohepatitis (NASH): Secondary | ICD-10-CM | POA: Diagnosis not present

## 2020-11-09 DIAGNOSIS — B449 Aspergillosis, unspecified: Secondary | ICD-10-CM | POA: Diagnosis present

## 2020-11-09 DIAGNOSIS — Y92239 Unspecified place in hospital as the place of occurrence of the external cause: Secondary | ICD-10-CM | POA: Diagnosis present

## 2020-11-09 DIAGNOSIS — B369 Superficial mycosis, unspecified: Secondary | ICD-10-CM | POA: Diagnosis present

## 2020-11-09 DIAGNOSIS — I10 Essential (primary) hypertension: Secondary | ICD-10-CM | POA: Diagnosis present

## 2020-11-09 DIAGNOSIS — K7460 Unspecified cirrhosis of liver: Secondary | ICD-10-CM | POA: Diagnosis not present

## 2020-11-09 DIAGNOSIS — I480 Paroxysmal atrial fibrillation: Secondary | ICD-10-CM

## 2020-11-09 DIAGNOSIS — D696 Thrombocytopenia, unspecified: Secondary | ICD-10-CM

## 2020-11-09 DIAGNOSIS — Z20822 Contact with and (suspected) exposure to covid-19: Secondary | ICD-10-CM | POA: Diagnosis present

## 2020-11-09 DIAGNOSIS — I6523 Occlusion and stenosis of bilateral carotid arteries: Secondary | ICD-10-CM | POA: Diagnosis present

## 2020-11-09 DIAGNOSIS — R6521 Severe sepsis with septic shock: Secondary | ICD-10-CM | POA: Diagnosis present

## 2020-11-09 DIAGNOSIS — I952 Hypotension due to drugs: Secondary | ICD-10-CM | POA: Diagnosis not present

## 2020-11-09 DIAGNOSIS — I248 Other forms of acute ischemic heart disease: Secondary | ICD-10-CM | POA: Diagnosis present

## 2020-11-09 DIAGNOSIS — Z01818 Encounter for other preprocedural examination: Secondary | ICD-10-CM

## 2020-11-09 DIAGNOSIS — R110 Nausea: Secondary | ICD-10-CM | POA: Diagnosis not present

## 2020-11-09 DIAGNOSIS — K922 Gastrointestinal hemorrhage, unspecified: Secondary | ICD-10-CM | POA: Diagnosis not present

## 2020-11-09 DIAGNOSIS — M109 Gout, unspecified: Secondary | ICD-10-CM | POA: Diagnosis present

## 2020-11-09 DIAGNOSIS — D709 Neutropenia, unspecified: Secondary | ICD-10-CM | POA: Diagnosis not present

## 2020-11-09 DIAGNOSIS — Z8249 Family history of ischemic heart disease and other diseases of the circulatory system: Secondary | ICD-10-CM

## 2020-11-09 DIAGNOSIS — I509 Heart failure, unspecified: Secondary | ICD-10-CM | POA: Diagnosis not present

## 2020-11-09 DIAGNOSIS — L97529 Non-pressure chronic ulcer of other part of left foot with unspecified severity: Secondary | ICD-10-CM | POA: Diagnosis present

## 2020-11-09 DIAGNOSIS — D61818 Other pancytopenia: Secondary | ICD-10-CM | POA: Diagnosis not present

## 2020-11-09 DIAGNOSIS — N170 Acute kidney failure with tubular necrosis: Secondary | ICD-10-CM | POA: Diagnosis present

## 2020-11-09 DIAGNOSIS — Z6835 Body mass index (BMI) 35.0-35.9, adult: Secondary | ICD-10-CM

## 2020-11-09 DIAGNOSIS — E669 Obesity, unspecified: Secondary | ICD-10-CM | POA: Diagnosis present

## 2020-11-09 DIAGNOSIS — L089 Local infection of the skin and subcutaneous tissue, unspecified: Secondary | ICD-10-CM | POA: Diagnosis not present

## 2020-11-09 DIAGNOSIS — Z87442 Personal history of urinary calculi: Secondary | ICD-10-CM

## 2020-11-09 DIAGNOSIS — I4892 Unspecified atrial flutter: Secondary | ICD-10-CM | POA: Diagnosis not present

## 2020-11-09 DIAGNOSIS — Z794 Long term (current) use of insulin: Secondary | ICD-10-CM

## 2020-11-09 DIAGNOSIS — Z90710 Acquired absence of both cervix and uterus: Secondary | ICD-10-CM

## 2020-11-09 DIAGNOSIS — L304 Erythema intertrigo: Secondary | ICD-10-CM | POA: Diagnosis present

## 2020-11-09 LAB — COMPREHENSIVE METABOLIC PANEL
ALT: 25 U/L (ref 0–44)
AST: 35 U/L (ref 15–41)
Albumin: 2.7 g/dL — ABNORMAL LOW (ref 3.5–5.0)
Alkaline Phosphatase: 84 U/L (ref 38–126)
Anion gap: 12 (ref 5–15)
BUN: 69 mg/dL — ABNORMAL HIGH (ref 8–23)
CO2: 18 mmol/L — ABNORMAL LOW (ref 22–32)
Calcium: 8.4 mg/dL — ABNORMAL LOW (ref 8.9–10.3)
Chloride: 102 mmol/L (ref 98–111)
Creatinine, Ser: 3.65 mg/dL — ABNORMAL HIGH (ref 0.44–1.00)
GFR, Estimated: 13 mL/min — ABNORMAL LOW (ref >60)
Glucose, Bld: 164 mg/dL — ABNORMAL HIGH (ref 70–99)
Potassium: 3.2 mmol/L — ABNORMAL LOW (ref 3.5–5.1)
Sodium: 132 mmol/L — ABNORMAL LOW (ref 135–145)
Total Bilirubin: 0.4 mg/dL (ref 0.3–1.2)
Total Protein: 6.4 g/dL — ABNORMAL LOW (ref 6.5–8.1)

## 2020-11-09 LAB — CBC WITH DIFFERENTIAL/PLATELET
Abs Immature Granulocytes: 0.1 10*3/uL — ABNORMAL HIGH (ref 0.00–0.07)
Basophils Absolute: 0 10*3/uL (ref 0.0–0.1)
Basophils Relative: 0 %
Eosinophils Absolute: 0 10*3/uL (ref 0.0–0.5)
Eosinophils Relative: 0 %
HCT: 24.5 % — ABNORMAL LOW (ref 36.0–46.0)
Hemoglobin: 8 g/dL — ABNORMAL LOW (ref 12.0–15.0)
Immature Granulocytes: 1 %
Lymphocytes Relative: 5 %
Lymphs Abs: 0.8 10*3/uL (ref 0.7–4.0)
MCH: 28.2 pg (ref 26.0–34.0)
MCHC: 32.7 g/dL (ref 30.0–36.0)
MCV: 86.3 fL (ref 80.0–100.0)
Monocytes Absolute: 1.4 10*3/uL — ABNORMAL HIGH (ref 0.1–1.0)
Monocytes Relative: 9 %
Neutro Abs: 13.5 10*3/uL — ABNORMAL HIGH (ref 1.7–7.7)
Neutrophils Relative %: 85 %
Platelets: 190 10*3/uL (ref 150–400)
RBC: 2.84 MIL/uL — ABNORMAL LOW (ref 3.87–5.11)
RDW: 14.5 % (ref 11.5–15.5)
WBC: 15.8 10*3/uL — ABNORMAL HIGH (ref 4.0–10.5)
nRBC: 0 % (ref 0.0–0.2)

## 2020-11-09 LAB — PROTIME-INR
INR: 1.3 — ABNORMAL HIGH (ref 0.8–1.2)
Prothrombin Time: 16.4 seconds — ABNORMAL HIGH (ref 11.4–15.2)

## 2020-11-09 LAB — IRON AND TIBC
Iron: 14 ug/dL — ABNORMAL LOW (ref 28–170)
Saturation Ratios: 6 % — ABNORMAL LOW (ref 10.4–31.8)
TIBC: 232 ug/dL — ABNORMAL LOW (ref 250–450)
UIBC: 218 ug/dL

## 2020-11-09 LAB — TROPONIN I (HIGH SENSITIVITY)
Troponin I (High Sensitivity): 147 ng/L (ref <18)
Troponin I (High Sensitivity): 151 ng/L (ref <18)

## 2020-11-09 LAB — CK: Total CK: 184 U/L (ref 38–234)

## 2020-11-09 LAB — APTT: aPTT: 41 seconds — ABNORMAL HIGH (ref 24–36)

## 2020-11-09 LAB — LACTIC ACID, PLASMA
Lactic Acid, Venous: 1.3 mmol/L (ref 0.5–1.9)
Lactic Acid, Venous: 1 mmol/L (ref 0.5–1.9)

## 2020-11-09 LAB — SEDIMENTATION RATE
Sed Rate: 127 mm/hr — ABNORMAL HIGH (ref 0–30)
Sed Rate: 126 mm/hr — ABNORMAL HIGH (ref 0–30)

## 2020-11-09 LAB — RESP PANEL BY RT-PCR (FLU A&B, COVID) ARPGX2
Influenza A by PCR: NEGATIVE
Influenza B by PCR: NEGATIVE
SARS Coronavirus 2 by RT PCR: NEGATIVE

## 2020-11-09 LAB — TSH: TSH: 0.526 u[IU]/mL (ref 0.350–4.500)

## 2020-11-09 LAB — MAGNESIUM: Magnesium: 2 mg/dL (ref 1.7–2.4)

## 2020-11-09 LAB — BRAIN NATRIURETIC PEPTIDE: B Natriuretic Peptide: 360.1 pg/mL — ABNORMAL HIGH (ref 0.0–100.0)

## 2020-11-09 MED ORDER — LACTATED RINGERS IV SOLN: INTRAVENOUS | Status: DC

## 2020-11-09 MED ORDER — DILTIAZEM HCL-DEXTROSE 125-5 MG/125ML-% IV SOLN (PREMIX)
5.0000 mg/h | INTRAVENOUS | Status: DC
  Administered 2020-11-09: 5 mg/h via INTRAVENOUS
  Administered 2020-11-10: 15 mg/h via INTRAVENOUS
  Filled 2020-11-09 (×3): qty 125

## 2020-11-09 MED ORDER — METRONIDAZOLE 500 MG/100ML IV SOLN
500.0000 mg | Freq: Three times a day (TID) | INTRAVENOUS | Status: DC
  Administered 2020-11-09 – 2020-11-12 (×10): 500 mg via INTRAVENOUS
  Filled 2020-11-09 (×12): qty 100

## 2020-11-09 MED ORDER — HEPARIN BOLUS VIA INFUSION
4000.0000 [IU] | Freq: Once | INTRAVENOUS | Status: DC
  Filled 2020-11-09: qty 4000

## 2020-11-09 MED ORDER — ONDANSETRON HCL 4 MG/2ML IJ SOLN
4.0000 mg | Freq: Four times a day (QID) | INTRAMUSCULAR | Status: DC | PRN
  Administered 2020-11-10 – 2020-11-15 (×2): 4 mg via INTRAVENOUS
  Filled 2020-11-09 (×2): qty 2

## 2020-11-09 MED ORDER — ACETAMINOPHEN 325 MG PO TABS: 650.0000 mg | ORAL_TABLET | Freq: Four times a day (QID) | ORAL | Status: DC | PRN

## 2020-11-09 MED ORDER — ONDANSETRON HCL 4 MG PO TABS: 4.0000 mg | ORAL_TABLET | Freq: Four times a day (QID) | ORAL | Status: DC | PRN

## 2020-11-09 MED ORDER — SODIUM CHLORIDE 0.9 % IV SOLN
2.0000 g | INTRAVENOUS | Status: DC
  Administered 2020-11-09 – 2020-11-12 (×4): 2 g via INTRAVENOUS
  Filled 2020-11-09 (×4): qty 2

## 2020-11-09 MED ORDER — SODIUM CHLORIDE 0.9 % IV SOLN: INTRAVENOUS | Status: DC

## 2020-11-09 MED ORDER — VANCOMYCIN VARIABLE DOSE PER UNSTABLE RENAL FUNCTION (PHARMACIST DOSING): Status: DC

## 2020-11-09 MED ORDER — HEPARIN (PORCINE) 25000 UT/250ML-% IV SOLN
1800.0000 [IU]/h | INTRAVENOUS | Status: DC
  Administered 2020-11-09: 1100 [IU]/h via INTRAVENOUS
  Administered 2020-11-10: 1400 [IU]/h via INTRAVENOUS
  Administered 2020-11-11: 1800 [IU]/h via INTRAVENOUS
  Filled 2020-11-09 (×3): qty 250

## 2020-11-09 MED ORDER — ACETAMINOPHEN 650 MG RE SUPP
650.0000 mg | Freq: Four times a day (QID) | RECTAL | Status: DC | PRN
  Filled 2020-11-09: qty 1

## 2020-11-09 MED ORDER — VANCOMYCIN HCL IN DEXTROSE 1-5 GM/200ML-% IV SOLN
1000.0000 mg | INTRAVENOUS | Status: DC
Start: 2020-11-11 — End: 2020-11-10

## 2020-11-09 MED ORDER — VANCOMYCIN HCL 1750 MG/350ML IV SOLN
1750.0000 mg | Freq: Once | INTRAVENOUS | Status: AC
  Administered 2020-11-09: 1750 mg via INTRAVENOUS
  Filled 2020-11-09: qty 350

## 2020-11-09 MED ORDER — SODIUM CHLORIDE 0.9 % IV BOLUS
500.0000 mL | Freq: Once | INTRAVENOUS | Status: AC
  Administered 2020-11-09: 500 mL via INTRAVENOUS

## 2020-11-09 MED ORDER — DILTIAZEM HCL 25 MG/5ML IV SOLN
10.0000 mg | Freq: Once | INTRAVENOUS | Status: AC
  Administered 2020-11-09: 10 mg via INTRAVENOUS
  Filled 2020-11-09: qty 5

## 2020-11-09 MED ORDER — POTASSIUM CHLORIDE CRYS ER 20 MEQ PO TBCR
40.0000 meq | EXTENDED_RELEASE_TABLET | Freq: Once | ORAL | Status: AC
  Administered 2020-11-09: 40 meq via ORAL
  Filled 2020-11-09: qty 2

## 2020-11-09 NOTE — Progress Notes (Signed)
Received consult through secure chat. MRI ordered to further evaluate extent of infection. I will see the patient in the morning, Dr. Francine Graven aware.

## 2020-11-09 NOTE — ED Notes (Signed)
Pt returned from CT °

## 2020-11-09 NOTE — ED Notes (Addendum)
Pt to MRI with RN Pt alert and oriented x4

## 2020-11-09 NOTE — ED Notes (Signed)
Pt returned from MRI °

## 2020-11-09 NOTE — H&P (Addendum)
History and Physical    Autumn [name]:[id number] DOB: [birth year] DOA: 11/09/2020  PCP: Harlow Ohms, MD   Patient coming from: Home  I have personally briefly reviewed patient's old medical records in [place]  Chief Complaint: Fall  HPI: Autumn [name] is a 70 y.o. 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 Her family had called EMS because she has had multiple falls this week and has hit her head on the ground twice without any loss of consciousness. She also has a large bruise over her right breast related to recent fall. Patient states that she woke up and found herself on the floor and does not remember how she got there.  She is not sure if she fell. In the ER she was found to be in rapid A. fib and does not have a known history of atrial fibrillation. She complains of lower back pain which is chronic.  She denies feeling dizzy or lightheaded prior to her falls and states that her legs gave way. She denies having any chest pain, no shortness of breath, no fever, no chills, no dizziness, no lightheadedness, no abdominal pain, no changes in her bowel habits, no nausea, no vomiting, no urinary symptoms, no headache, no blurred vision, no focal deficit, no urinary symptoms. Labs show sodium 132, potassium 3.2, chloride 102, bicarb 18, glucose 164, BUN 69 compared to baseline of 43, creatinine 3.65 compared to baseline of 3.19, calcium 8.4, alkaline phosphatase 84, albumin 2.7, AST 35, ALT 25, total protein 6.4, BNP 360, troponin 147, lactic acid 1.3, white count 15.8, hemoglobin 8.0, hematocrit 24.5, MCV 86.3, RDW 14.5, platelet count 190 Left foot x-ray shows cortical irregularity of  the third metatarsal head concerning for osteomyelitis. Irregularity of the second metatarsal head concerning for osteomyelitis. Mild periosteal reaction involving the second and third metatarsal shafts. Chest x-ray reviewed by me shows no evidence of active cardiopulmonary disease. CT scan of the head without contrast shows no evidence of acute intracranial abnormality Left lower extremity venous Doppler shows no evidence of DVT Twelve-lead EKG reviewed by me shows atrial fibrillation    ED Course: Patient is a 70 year old female with multiple medical problems who was brought into the ER by EMS for evaluation of frequent falls. Upon arrival to the ER she was noted to be in rapid atrial fibrillation which appears to be new onset and labs show worsening of her renal function from a baseline of 3.19 >> 3.6 and elevated BUN. She also has a chronic left foot wound and imaging is consistent with osteomyelitis. She will be admitted to the hospital for further evaluation.   Review of Systems: As per HPI otherwise all other systems reviewed and negative.    Past Medical History:  Diagnosis Date   Diabetes mellitus without complication ([place])    Hypertension    Kidney stone    Thyroid disease     Past Surgical History:  Procedure Laterality Date   ABDOMINAL HYSTERECTOMY     URETERAL STENT PLACEMENT       reports that she has quit smoking. She has never used smokeless tobacco. She reports that she does not drink alcohol and does not use drugs.  No Known Allergies  Family  History  Problem Relation Age of Onset   Hypertension Mother       Prior to Admission medications   Not on File    Physical Exam: Vitals:   11/09/20 1300 11/09/20 1400 11/09/20 1430 11/09/20 1500  BP: (!) 123/107 (!) 141/82 127/78 (!) 131/91  Pulse: (!) 132 (!) 124 (!) 128 (!) 109  Resp: (!) 23 (!) 29 (!) 26 (!) 25  Temp:      SpO2: 94% 94% 93% 93%  Weight:         Vitals:   11/09/20 1300 11/09/20  1400 11/09/20 1430 11/09/20 1500  BP: (!) 123/107 (!) 141/82 127/78 (!) 131/91  Pulse: (!) 132 (!) 124 (!) 128 (!) 109  Resp: (!) 23 (!) 29 (!) 26 (!) 25  Temp:      SpO2: 94% 94% 93% 93%  Weight:          Constitutional: Alert and oriented x 3 . Not in any apparent distress.  Chronically ill-appearing.  Looks older than stated age.   HEENT:      Head: Normocephalic and atraumatic.         Eyes: PERLA, EOMI, Conjunctivae pallor. Sclera is non-icteric.       Mouth/Throat: Mucous membranes are dry       Neck: Supple with no signs of meningismus. Cardiovascular: Irregularly irregular, tachycardic . No murmurs, gallops, or rubs. 2+ symmetrical distal pulses are present . No JVD. 2+ LE edema Respiratory: Respiratory effort normal .Lungs sounds clear bilaterally. No wheezes, crackles, or rhonchi. Hematoma over right breast (POA) Gastrointestinal: Soft, non tender, and non distended with positive bowel sounds.  Genitourinary: No CVA tenderness. Musculoskeletal: Nontender with normal range of motion in all extremities. Ulcer on the plantar surface of both left foot.  Left foot is red and swollen Neurologic:  Face is symmetric. Moving all extremities. No gross focal neurologic deficits .  Generalized weakness Skin: Skin is warm, dry.  Generalized pallor Psychiatric: Mood and affect are normal    Labs on Admission: I have personally reviewed following labs and imaging studies  CBC: Recent Labs  Lab 11/09/20 1220  WBC 15.8*  NEUTROABS 13.5*  HGB 8.0*  HCT 24.5*  MCV 86.3  PLT 604   Basic Metabolic Panel: Recent Labs  Lab 11/09/20 1220  NA 132*  K 3.2*  CL 102  CO2 18*  GLUCOSE 164*  BUN 69*  CREATININE 3.65*  CALCIUM 8.4*   GFR: CrCl cannot be calculated (Unknown ideal weight.). Liver Function Tests: Recent Labs  Lab 11/09/20 1220  AST 35  ALT 25  ALKPHOS 84  BILITOT 0.4  PROT 6.4*  ALBUMIN 2.7*   No results for input(s): LIPASE, AMYLASE in the last 168  hours. No results for input(s): AMMONIA in the last 168 hours. Coagulation Profile: No results for input(s): INR, PROTIME in the last 168 hours. Cardiac Enzymes: No results for input(s): CKTOTAL, CKMB, CKMBINDEX, TROPONINI in the last 168 hours. BNP (last 3 results) No results for input(s): PROBNP in the last 8760 hours. HbA1C: No results for input(s): HGBA1C in the last 72 hours. CBG: No results for input(s): GLUCAP in the last 168 hours. Lipid Profile: No results for input(s): CHOL, HDL, LDLCALC, TRIG, CHOLHDL, LDLDIRECT in the last 72 hours. Thyroid Function Tests: No results for input(s): TSH, T4TOTAL, FREET4, T3FREE, THYROIDAB in the last 72 hours. Anemia Panel: No results for input(s): VITAMINB12, FOLATE, FERRITIN, TIBC, IRON, RETICCTPCT in the last 72 hours. Urine analysis:  Component Value Date/Time   COLORURINE YELLOW (A) 03/30/2017 1948   APPEARANCEUR CLEAR (A) 03/30/2017 1948   LABSPEC 1.016 03/30/2017 1948   PHURINE 5.0 03/30/2017 1948   GLUCOSEU NEGATIVE 03/30/2017 1948   HGBUR MODERATE (A) 03/30/2017 1948   BILIRUBINUR NEGATIVE 03/30/2017 [place] NEGATIVE 03/30/2017 1948   PROTEINUR 100 (A) 03/30/2017 1948   NITRITE NEGATIVE 03/30/2017 1948   LEUKOCYTESUR NEGATIVE 03/30/2017 1948    Radiological Exams on Admission: CT Head Wo Contrast  Result Date: 11/09/2020 CLINICAL DATA:  70 year old female with acute altered mental status. EXAM: CT HEAD WITHOUT CONTRAST TECHNIQUE: Contiguous axial images were obtained from the base of the skull through the vertex without intravenous contrast. COMPARISON:  None. FINDINGS: Brain: No evidence of acute infarction, hemorrhage, hydrocephalus, extra-axial collection or mass lesion/mass effect. Vascular: Carotid and vertebral atherosclerotic calcifications are noted. Skull: No acute abnormality Sinuses/Orbits: No acute abnormality Other: None IMPRESSION: No evidence of acute intracranial abnormality. Electronically Signed    By: Margarette Canada M.D.   On: 11/09/2020 13:05   US Venous Img Lower Unilateral Left  Result Date: 11/09/2020 CLINICAL DATA:  pain swelling EXAM: LEFT LOWER EXTREMITY VENOUS DOPPLER ULTRASOUND TECHNIQUE: Gray-scale sonography with graded compression, as well as color Doppler and duplex ultrasound were performed to evaluate the lower extremity deep venous systems from the level of the common femoral vein and including the common femoral, femoral, profunda femoral, popliteal and calf veins including the posterior tibial, peroneal and gastrocnemius veins when visible. The superficial great saphenous vein was also interrogated. Spectral Doppler was utilized to evaluate flow at rest and with distal augmentation maneuvers in the common femoral, femoral and popliteal veins. COMPARISON:  None. FINDINGS: Contralateral Common Femoral Vein: Respiratory phasicity is normal and symmetric with the symptomatic side. No evidence of thrombus. Normal compressibility. Common Femoral Vein: No evidence of thrombus. Normal compressibility, respiratory phasicity and response to augmentation. Saphenofemoral Junction: No evidence of thrombus. Normal compressibility and flow on color Doppler imaging. Profunda Femoral Vein: No evidence of thrombus. Normal compressibility and flow on color Doppler imaging. Femoral Vein: No evidence of thrombus. Normal compressibility, respiratory phasicity and response to augmentation. Popliteal Vein: No evidence of thrombus. Normal compressibility, respiratory phasicity and response to augmentation. Calf Veins: No evidence of thrombus. Normal compressibility and flow on color Doppler imaging. Other Findings:  Calf edema. IMPRESSION: No evidence of deep venous thrombosis.  Calf edema is present. Electronically Signed   By: Albin Felling M.D.   On: 11/09/2020 14:44   DG Chest Portable 1 View  Result Date: 11/09/2020 CLINICAL DATA:  Altered mental status and fall EXAM: PORTABLE CHEST 1 VIEW COMPARISON:   07/26/2007 and other studies FINDINGS: The cardiomediastinal silhouette is unremarkable. There is no evidence of focal airspace disease, pulmonary edema, suspicious pulmonary nodule/mass, pleural effusion, or pneumothorax. No acute bony abnormalities are identified. IMPRESSION: No active disease. Electronically Signed   By: Margarette Canada M.D.   On: 11/09/2020 13:06   DG Foot Complete Left  Result Date: 11/09/2020 CLINICAL DATA:  Pain and swelling.  Ulcer at the bottom of the foot. EXAM: LEFT FOOT - COMPLETE 3+ VIEW COMPARISON:  None. FINDINGS: Soft tissue wound along the plantar aspect of the forefoot. Cortical irregularity of the third metatarsal head concerning for osteomyelitis. Irregularity of the second metatarsal head concerning for osteomyelitis. Mild periosteal reaction involving the second and third metatarsal shafts. Dorsal subluxation of the second and third proximal phalanx. Soft tissue emphysema in the second toe. Severe soft tissue swelling  involving the foot and ankle. IMPRESSION: Cortical irregularity of the third metatarsal head concerning for osteomyelitis. Irregularity of the second metatarsal head concerning for osteomyelitis. Mild periosteal reaction involving the second and third metatarsal shafts. Soft tissue swelling involving the foot and ankle which may be reactive versus secondary to cellulitis. Electronically Signed   By: Kathreen Devoid M.D.   On: 11/09/2020 13:45     Assessment/Plan Principal Problem:   Sepsis ([place]) Active Problems:   Atrial fibrillation with rapid ventricular response (HCC)   Foot osteomyelitis, left (HCC)   CKD stage 5 due to type 2 diabetes mellitus ([place])   Liver cirrhosis secondary to NASH (nonalcoholic steatohepatitis) (HCC)   Hypertension   History of anemia due to CKD   Frequent falls       Patient is a 70 year old female who presents to the ER for evaluation of frequent falls and is found to be septic from left foot osteomyelitis and also in  rapid A. fib.   Sepsis from left foot osteomyelitis (POA) As evidenced by tachycardia, tachypnea, marked leukocytosis and a left foot wound with imaging suggestive of osteomyelitis. Lactic acid is within normal limits Will hydrate patient with LR  Place patient empirically on antibiotic, IV cefepime, IV vancomycin and IV Flagyl We will consult podiatry      New onset atrial fibrillation with rapid ventricular rate Patient has a CHA2DS2-VASc score of 4 and ideally requires long-term anticoagulation as primary prophylaxis for an acute stroke Continue Cardizem drip initiated in the ER for rate control We will start patient on a heparin drip per protocol Obtain 2D echocardiogram to assess LVEF and rule out valvular pathology Obtain TSH levels Consult cardiology     Diabetes mellitus with complications of stage V chronic kidney disease Maintain consistent carbohydrate diet Glycemic control with sliding scale insulin Patient will likely need renal replacement therapy but not emergently as she does not appear fluid overloaded at this time and has hypokalemia Avoid nephrotoxic agents Obtain total CK levels since patient has had frequent falls Obtain renal ultrasound Consult nephrology      History of liver cirrhosis secondary to [name] Stable    Frequent falls Place patient on fall precautions She will need PT evaluation once her acute illness improves/resolves     Anemia of chronic kidney disease Monitor H&H closely Obtain iron levels    Elevated troponin Most likely secondary to underlying renal disease We will cycle cardiac enzymes    Hypokalemia Most likely secondary to diuretic use Supplement potassium Check magnesium levels   DVT prophylaxis: Heparin Code Status: full code  Family Communication: Greater than 50% of time was spent discussing patient's condition and plan of care with her at the bedside.  All questions and concerns have been addressed.   She verbalizes understanding and agrees with the plan.  CODE STATUS was discussed and she wishes to be full code.  She does not have a designated healthcare power of attorney. Disposition Plan: Back to previous home environment Consults called: Nephrology/cardiology/podiatry Status:At the time of admission, it appears that the appropriate admission status for this patient is inpatient. This is judged to be reasonable and necessary to provide the required intensity of service to ensure the patient's safety given the presenting symptoms, physical exam findings, and initial radiographic and laboratory data in the context of their comorbid conditions. Patient requires inpatient status due to high intensity of service, high risk for further deterioration and high frequency of surveillance required.     Dannie Woolen  MD [facility]     11/09/2020, 4:01 PM

## 2020-11-09 NOTE — ED Triage Notes (Signed)
Pt arrived today after waking up on the floor. Pt unable to verbalize her last memories. Pt family endorsing multiple falls this week with hitting head x2. Pt  has not been seen by any healthcare providers, pt endorses recent foot surgery of left foot. Pt lf foot has wound on bottom of left foot related to surgery. Pt L foot is reddened and swollen. Pt appears slightly drowsy but arousable. Pt appears pale and endorsing that they have poor memory of recent events. Pt oriented x4.

## 2020-11-09 NOTE — Consult Note (Signed)
ANTICOAGULATION CONSULT NOTE - Initial Consult  Pharmacy Consult for Heparin gtt Indication: atrial fibrillation  No Known Allergies  Patient Measurements: Weight: 86.2 kg (190 lb) Heparin Dosing Weight: 77.8 kg  Vital Signs: Temp: 99.4 F (37.4 C) (10/29 1213) BP: 124/92 (10/29 1600) Pulse Rate: 124 (10/29 1600)  Labs: Recent Labs    11/09/20 1220  HGB 8.0*  HCT 24.5*  PLT 190  CREATININE 3.65*  TROPONINIHS 147*    CrCl cannot be calculated (Unknown ideal weight.).   Medical History: Past Medical History:  Diagnosis Date   Diabetes mellitus without complication ([place])    Hypertension    Kidney stone    Thyroid disease     Medications:  Scheduled:   potassium chloride  40 mEq Oral Once   vancomycin variable dose per unstable renal function (pharmacist dosing)   Does not apply See admin instructions    Assessment: Patient admitted with new onset A/Fib and worsening renal function. Antibiotics for osteomyelitis were initiated and pharmacy consulted for heparin drip management.  Noted history of DM, CKD-stage 4,HTN, thyroid disorder, and liver cirrhosis secondary to NASH.  Baseline INR , PLT 190 and PTT  Goal of Therapy:  Heparin level 0.3-0.7 units/ml Monitor platelets by anticoagulation protocol: Yes   Plan:  No BOLUS per Dr Dorthula Perfect request Start heparin infusion at 1100 units/hr Check anti-Xa level in 6 hours and daily while on heparin Continue to monitor H&H and platelets  Alysen Smylie Rodriguez-Guzman PharmD, BCPS 11/09/2020 4:39 PM

## 2020-11-09 NOTE — ED Notes (Signed)
Pt brief changed, bedding changed and pt peri area cleaned. Pt pure wick placed. Pot given diet gingerale and pillow placed under L buttock.

## 2020-11-09 NOTE — ED Notes (Signed)
Pt given diet gingerale and ice. Pt sat up in bed. Denies needs at this time.

## 2020-11-09 NOTE — ED Notes (Signed)
Pt to Ultrasound

## 2020-11-09 NOTE — ED Provider Notes (Signed)
[facility] Emergency Department Provider Note   ____________________________________________   Event Date/Time   First MD Initiated Contact with Patient 11/09/20 1229     (approximate)  I have reviewed the triage vital signs and the nursing notes.   HISTORY  Chief Complaint Fall   HPI Autumn [name] is a 70 y.o. female patient reports she has been having foot pain and swelling and redness for for 5 weeks and she stopped her antibiotics for the elective surgery she was gone again.  Today she was doing well and woke up on the floor does not know why she fell.  She fell several times this week hitting her head twice.  She is not complaining of chest pain or shortness of breath.  She has some pain on her breasts where she has a fungal infection in the skin under the breast.  Otherwise she is doing well she says.         Past Medical History:  Diagnosis Date   Diabetes mellitus without complication ([place])    Hypertension    Kidney stone    Thyroid disease     There are no problems to display for this patient.   Past Surgical History:  Procedure Laterality Date   ABDOMINAL HYSTERECTOMY     URETERAL STENT PLACEMENT      Prior to Admission medications   Not on File    Allergies Patient has no known allergies.  No family history on file.  Social History Social History   Tobacco Use   Smoking status: Former   Smokeless tobacco: Never  Substance Use Topics   Alcohol use: Never   Drug use: Never    Review of Systems  Constitutional: No fever/chills Eyes: No visual changes. ENT: No sore throat. Cardiovascular: Denies chest pain. Respiratory: Denies shortness of breath. Gastrointestinal: No abdominal pain.  No nausea, no vomiting.  No diarrhea.  No constipation. Genitourinary: Negative for dysuria. Musculoskeletal: Negative for back pain. Skin: Negative for rash. Neurological: Negative for headaches, focal weakness    ____________________________________________   PHYSICAL EXAM:  VITAL SIGNS: ED Triage Vitals  Enc Vitals Group     BP 11/09/20 1213 (!) 117/58     Pulse Rate 11/09/20 1213 (!) 128     Resp 11/09/20 1213 (!) 30     Temp 11/09/20 1213 99.4 F (37.4 C)     Temp src --      SpO2 11/09/20 1213 93 %     Weight 11/09/20 1214 190 lb (86.2 kg)     Height --      Head Circumference --      Peak Flow --      Pain Score 11/09/20 1214 8     Pain Loc --      Pain Edu? --      Excl. in [place]? --     Constitutional: Alert and oriented. Well appearing and in no acute distress. Eyes: Conjunctivae are normal. PER Head: Atraumatic. Nose: No congestion/rhinnorhea. Mouth/Throat: Mucous membranes are moist.  Oropharynx non-erythematous. Neck: No stridor.  Cardiovascular: Normal rate, regular rhythm. Grossly normal heart sounds.  Good peripheral circulation. Respiratory: Normal respiratory effort.  No retractions. Lungs CTAB. Gastrointestinal: Soft and nontender. No distention. No abdominal bruits. No CVA tenderness. Musculoskeletal: Right leg looks normal left leg is red and swollen and warm to just above the ankle.  There is ulcer over the distal foot on the sole. Neurologic:  Normal speech and language. No gross focal  neurologic deficits are appreciated. No gait instability. Skin:  Skin is warm, dry and intact. No rash noted. Psychiatric: Mood and affect are normal. Speech and behavior are normal.  ____________________________________________   LABS (all labs ordered are listed, but only abnormal results are displayed)  Labs Reviewed  COMPREHENSIVE METABOLIC PANEL - Abnormal; Notable for the following components:      Result Value   Sodium 132 (*)    Potassium 3.2 (*)    CO2 18 (*)    Glucose, Bld 164 (*)    BUN 69 (*)    Creatinine, Ser 3.65 (*)    Calcium 8.4 (*)    Total Protein 6.4 (*)    Albumin 2.7 (*)    GFR, Estimated 13 (*)    All other components within normal limits   BRAIN NATRIURETIC PEPTIDE - Abnormal; Notable for the following components:   B Natriuretic Peptide 360.1 (*)    All other components within normal limits  CBC WITH DIFFERENTIAL/PLATELET - Abnormal; Notable for the following components:   WBC 15.8 (*)    RBC 2.84 (*)    Hemoglobin 8.0 (*)    HCT 24.5 (*)    Neutro Abs 13.5 (*)    Monocytes Absolute 1.4 (*)    Abs Immature Granulocytes 0.10 (*)    All other components within normal limits  SEDIMENTATION RATE - Abnormal; Notable for the following components:   Sed Rate 127 (*)    All other components within normal limits  TROPONIN I (HIGH SENSITIVITY) - Abnormal; Notable for the following components:   Troponin I (High Sensitivity) 147 (*)    All other components within normal limits  LACTIC ACID, PLASMA  LACTIC ACID, PLASMA  URINALYSIS, ROUTINE W REFLEX MICROSCOPIC  CK  TSH  CBG MONITORING, ED  TROPONIN I (HIGH SENSITIVITY)   ____________________________________________  EKG  EKG read interpreted by me shows A. fib at a rate of 136 right and leftward axis no acute ST-T wave changes ____________________________________________  RADIOLOGY Gertha Calkin, personally viewed and evaluated these images (plain radiographs) as part of my medical decision making, as well as reviewing the written report by the radiologist.  ED MD interpretation: Head CT read by radiology reviewed by me shows no acute disease.  Chest x-ray read by radiology reviewed by me shows no acute disease.  Foot x-ray shows several areas of possible osteomyelitis in the metatarsal heads.  I reviewed the films before the radiologist read these.  Official radiology report(s): CT Head Wo Contrast  Result Date: 11/09/2020 CLINICAL DATA:  70 year old female with acute altered mental status. EXAM: CT HEAD WITHOUT CONTRAST TECHNIQUE: Contiguous axial images were obtained from the base of the skull through the vertex without intravenous contrast. COMPARISON:  None.  FINDINGS: Brain: No evidence of acute infarction, hemorrhage, hydrocephalus, extra-axial collection or mass lesion/mass effect. Vascular: Carotid and vertebral atherosclerotic calcifications are noted. Skull: No acute abnormality Sinuses/Orbits: No acute abnormality Other: None IMPRESSION: No evidence of acute intracranial abnormality. Electronically Signed   By: Margarette Canada M.D.   On: 11/09/2020 13:05   US Venous Img Lower Unilateral Left  Result Date: 11/09/2020 CLINICAL DATA:  pain swelling EXAM: LEFT LOWER EXTREMITY VENOUS DOPPLER ULTRASOUND TECHNIQUE: Gray-scale sonography with graded compression, as well as color Doppler and duplex ultrasound were performed to evaluate the lower extremity deep venous systems from the level of the common femoral vein and including the common femoral, femoral, profunda femoral, popliteal and calf veins including the posterior tibial,  peroneal and gastrocnemius veins when visible. The superficial great saphenous vein was also interrogated. Spectral Doppler was utilized to evaluate flow at rest and with distal augmentation maneuvers in the common femoral, femoral and popliteal veins. COMPARISON:  None. FINDINGS: Contralateral Common Femoral Vein: Respiratory phasicity is normal and symmetric with the symptomatic side. No evidence of thrombus. Normal compressibility. Common Femoral Vein: No evidence of thrombus. Normal compressibility, respiratory phasicity and response to augmentation. Saphenofemoral Junction: No evidence of thrombus. Normal compressibility and flow on color Doppler imaging. Profunda Femoral Vein: No evidence of thrombus. Normal compressibility and flow on color Doppler imaging. Femoral Vein: No evidence of thrombus. Normal compressibility, respiratory phasicity and response to augmentation. Popliteal Vein: No evidence of thrombus. Normal compressibility, respiratory phasicity and response to augmentation. Calf Veins: No evidence of thrombus. Normal  compressibility and flow on color Doppler imaging. Other Findings:  Calf edema. IMPRESSION: No evidence of deep venous thrombosis.  Calf edema is present. Electronically Signed   By: Albin Felling M.D.   On: 11/09/2020 14:44   DG Chest Portable 1 View  Result Date: 11/09/2020 CLINICAL DATA:  Altered mental status and fall EXAM: PORTABLE CHEST 1 VIEW COMPARISON:  07/26/2007 and other studies FINDINGS: The cardiomediastinal silhouette is unremarkable. There is no evidence of focal airspace disease, pulmonary edema, suspicious pulmonary nodule/mass, pleural effusion, or pneumothorax. No acute bony abnormalities are identified. IMPRESSION: No active disease. Electronically Signed   By: Margarette Canada M.D.   On: 11/09/2020 13:06   DG Foot Complete Left  Result Date: 11/09/2020 CLINICAL DATA:  Pain and swelling.  Ulcer at the bottom of the foot. EXAM: LEFT FOOT - COMPLETE 3+ VIEW COMPARISON:  None. FINDINGS: Soft tissue wound along the plantar aspect of the forefoot. Cortical irregularity of the third metatarsal head concerning for osteomyelitis. Irregularity of the second metatarsal head concerning for osteomyelitis. Mild periosteal reaction involving the second and third metatarsal shafts. Dorsal subluxation of the second and third proximal phalanx. Soft tissue emphysema in the second toe. Severe soft tissue swelling involving the foot and ankle. IMPRESSION: Cortical irregularity of the third metatarsal head concerning for osteomyelitis. Irregularity of the second metatarsal head concerning for osteomyelitis. Mild periosteal reaction involving the second and third metatarsal shafts. Soft tissue swelling involving the foot and ankle which may be reactive versus secondary to cellulitis. Electronically Signed   By: Kathreen Devoid M.D.   On: 11/09/2020 13:45    ____________________________________________   PROCEDURES  Procedure(s) performed (including Critical Care): Critical care time 20 minutes this includes  reviewing the patient's old labs and other studies attempting to access other parts of her old record.  Additionally I have spoken with her briefly with her brothers and with the hospital doctor.  Procedures   ____________________________________________   INITIAL IMPRESSION / ASSESSMENT AND PLAN / ED COURSE Patient with osteomyelitis.  She says she has had this before but it looks like this is an new or at least reactivated infection with the elevated sed rate and white count redness and swelling.  There is also associated cellulitis.  Patient's GFR has gradually dropped.  She may be dehydrated she says she was not eating or drinking much.  Additionally her BNP is elevated and her troponins are elevated.  Additionally patient is in A. fib with RVR.  I will start diltiazem 2 control that.  Not sure if she is in A. fib and RVR because of infection from the foot and early sepsis or if this is a separate problem.  Either way both of these likely contributed to her passing out.  Her troponin is elevated I have not gotten the second 1 back yet to see if it is going up or staying stable.  The second 1 will help [place] decide how to further pursue that element of her medical problems.  It is possible she is having an NSTEMI or it could be just elevated from strain from the A. fib with RVR and the poor renal function.  I will also add a TSH to her lab work.  In any rate we will have to get her in the hospital to complete her medical treatment.              ____________________________________________   FINAL CLINICAL IMPRESSION(S) / ED DIAGNOSES  Final diagnoses:  Osteomyelitis of foot, left, acute (HCC)  Atrial fibrillation with RVR (HCC)  Syncope, unspecified syncope type  Worsening renal function     ED Discharge Orders     None        Note:  This document was prepared using Dragon voice recognition software and may include unintentional dictation errors.    Nena Polio,  MD 11/09/20 [phone]

## 2020-11-09 NOTE — ED Notes (Signed)
Pt to [place] with [place] tech on monitor.

## 2020-11-09 NOTE — Consult Note (Signed)
Pharmacy Antibiotic Note  Autumn [name] is a 70 y.o. female admitted on 11/09/2020 with  osteomyelitis .  Pharmacy has been consulted for CEFEPIME AND VANCOMYCIN dosing.  Plan: Vancomycin 1750mg  loading dose, then Vancomycin 1000 mg IV Q 48 hrs. Goal AUC 400-550. Expected AUC: 550 SCr used: 3.65  2. Cefepime 2gm q 24 hours   Weight: 86.2 kg (190 lb)  Temp (24hrs), Avg:99.4 F (37.4 C), Min:99.4 F (37.4 C), Max:99.4 F (37.4 C)  Recent Labs  Lab 11/09/20 1220  WBC 15.8*  CREATININE 3.65*  LATICACIDVEN 1.3    CrCl cannot be calculated (Unknown ideal weight.).    No Known Allergies  Antimicrobials this admission: 10/29 Vancomycin >>  10/29 Cefepime >>   10/29 Metronidazole>>  Dose adjustments this admission: N/A  Microbiology results: 10/29 UCx: ordered 10/29 MRSA PCR: ordered  Thank you for allowing pharmacy to be a part of this patient's care.  Teia Freitas Rodriguez-Guzman PharmD, BCPS 11/09/2020 4:20 PM

## 2020-11-10 ENCOUNTER — Inpatient Hospital Stay (HOSPITAL_COMMUNITY)
Admit: 2020-11-10 | Discharge: 2020-11-10 | Disposition: A | Discharge status: Home / Self Care | Payer: Medicare (Managed Care) | Attending: Internal Medicine | Admitting: Internal Medicine

## 2020-11-10 ENCOUNTER — Encounter: Payer: Self-pay | Admitting: Internal Medicine

## 2020-11-10 ENCOUNTER — Other Ambulatory Visit: Payer: Self-pay

## 2020-11-10 DIAGNOSIS — N189 Chronic kidney disease, unspecified: Secondary | ICD-10-CM

## 2020-11-10 DIAGNOSIS — I4891 Unspecified atrial fibrillation: Secondary | ICD-10-CM

## 2020-11-10 DIAGNOSIS — A419 Sepsis, unspecified organism: Secondary | ICD-10-CM | POA: Diagnosis not present

## 2020-11-10 DIAGNOSIS — N179 Acute kidney failure, unspecified: Secondary | ICD-10-CM

## 2020-11-10 DIAGNOSIS — M86172 Other acute osteomyelitis, left ankle and foot: Secondary | ICD-10-CM

## 2020-11-10 DIAGNOSIS — D638 Anemia in other chronic diseases classified elsewhere: Secondary | ICD-10-CM | POA: Insufficient documentation

## 2020-11-10 DIAGNOSIS — E039 Hypothyroidism, unspecified: Secondary | ICD-10-CM | POA: Insufficient documentation

## 2020-11-10 LAB — ECHOCARDIOGRAM COMPLETE
AR max vel: 1.37 cm2
AV Area VTI: 1.42 cm2
AV Area mean vel: 1.56 cm2
AV Mean grad: 5 mmHg
AV Peak grad: 8.1 mmHg
Ao pk vel: 1.42 m/s
Area-P 1/2: 2.99 cm2
Calc EF: 67.2 %
Height: 66 in
MV VTI: 1.66 cm2
S' Lateral: 2.8 cm
Single Plane A2C EF: 72.8 %
Single Plane A4C EF: 65.9 %
Weight: 3040 oz

## 2020-11-10 LAB — URINALYSIS, ROUTINE W REFLEX MICROSCOPIC
Bilirubin Urine: NEGATIVE
Glucose, UA: NEGATIVE mg/dL
Hgb urine dipstick: NEGATIVE
Ketones, ur: NEGATIVE mg/dL
Leukocytes,Ua: NEGATIVE
Nitrite: NEGATIVE
Protein, ur: 100 mg/dL — AB
Specific Gravity, Urine: 1.013 (ref 1.005–1.030)
pH: 5 (ref 5.0–8.0)

## 2020-11-10 LAB — LIPID PANEL
Cholesterol: 101 mg/dL (ref 0–200)
HDL: 21 mg/dL — ABNORMAL LOW (ref >40)
LDL Cholesterol: 50 mg/dL (ref 0–99)
Total CHOL/HDL Ratio: 4.8 RATIO
Triglycerides: 149 mg/dL (ref <150)
VLDL: 30 mg/dL (ref 0–40)

## 2020-11-10 LAB — MRSA NEXT GEN BY PCR, NASAL: MRSA by PCR Next Gen: NOT DETECTED

## 2020-11-10 LAB — HEPARIN LEVEL (UNFRACTIONATED)
Heparin Unfractionated: 0.1 IU/mL — ABNORMAL LOW (ref 0.30–0.70)
Heparin Unfractionated: 0.28 IU/mL — ABNORMAL LOW (ref 0.30–0.70)
Heparin Unfractionated: 0.18 IU/mL — ABNORMAL LOW (ref 0.30–0.70)

## 2020-11-10 LAB — GLUCOSE, CAPILLARY
Glucose-Capillary: 153 mg/dL — ABNORMAL HIGH (ref 70–99)
Glucose-Capillary: 124 mg/dL — ABNORMAL HIGH (ref 70–99)

## 2020-11-10 LAB — BASIC METABOLIC PANEL
Anion gap: 11 (ref 5–15)
BUN: 64 mg/dL — ABNORMAL HIGH (ref 8–23)
CO2: 18 mmol/L — ABNORMAL LOW (ref 22–32)
Calcium: 8.2 mg/dL — ABNORMAL LOW (ref 8.9–10.3)
Chloride: 103 mmol/L (ref 98–111)
Creatinine, Ser: 3.5 mg/dL — ABNORMAL HIGH (ref 0.44–1.00)
GFR, Estimated: 14 mL/min — ABNORMAL LOW (ref >60)
Glucose, Bld: 152 mg/dL — ABNORMAL HIGH (ref 70–99)
Potassium: 3.5 mmol/L (ref 3.5–5.1)
Sodium: 132 mmol/L — ABNORMAL LOW (ref 135–145)

## 2020-11-10 LAB — FERRITIN: Ferritin: 111 ng/mL (ref 11–307)

## 2020-11-10 LAB — CBC
HCT: 22.3 % — ABNORMAL LOW (ref 36.0–46.0)
Hemoglobin: 7.2 g/dL — ABNORMAL LOW (ref 12.0–15.0)
MCH: 27.6 pg (ref 26.0–34.0)
MCHC: 32.3 g/dL (ref 30.0–36.0)
MCV: 85.4 fL (ref 80.0–100.0)
Platelets: 180 10*3/uL (ref 150–400)
RBC: 2.61 MIL/uL — ABNORMAL LOW (ref 3.87–5.11)
RDW: 14.6 % (ref 11.5–15.5)
WBC: 13.5 10*3/uL — ABNORMAL HIGH (ref 4.0–10.5)
nRBC: 0 % (ref 0.0–0.2)

## 2020-11-10 LAB — TROPONIN I (HIGH SENSITIVITY)
Troponin I (High Sensitivity): 162 ng/L (ref <18)
Troponin I (High Sensitivity): 149 ng/L (ref <18)

## 2020-11-10 LAB — C-REACTIVE PROTEIN: CRP: 23.5 mg/dL — ABNORMAL HIGH (ref <1.0)

## 2020-11-10 LAB — HEMOGLOBIN A1C
Hgb A1c MFr Bld: 6.3 % — ABNORMAL HIGH (ref 4.8–5.6)
Mean Plasma Glucose: 134.11 mg/dL

## 2020-11-10 LAB — PREALBUMIN: Prealbumin: 7.8 mg/dL — ABNORMAL LOW (ref 18–38)

## 2020-11-10 LAB — HIV ANTIBODY (ROUTINE TESTING W REFLEX): HIV Screen 4th Generation wRfx: NONREACTIVE

## 2020-11-10 LAB — VANCOMYCIN, RANDOM: Vancomycin Rm: 8

## 2020-11-10 MED ORDER — ALLOPURINOL 100 MG PO TABS
200.0000 mg | ORAL_TABLET | Freq: Every day | ORAL | Status: DC
  Administered 2020-11-10 – 2020-11-16 (×6): 200 mg via ORAL
  Filled 2020-11-10 (×8): qty 2

## 2020-11-10 MED ORDER — MECLIZINE HCL 25 MG PO TABS
12.5000 mg | ORAL_TABLET | Freq: Three times a day (TID) | ORAL | Status: DC | PRN
  Filled 2020-11-10: qty 0.5

## 2020-11-10 MED ORDER — PERFLUTREN LIPID MICROSPHERE
1.0000 mL | INTRAVENOUS | Status: AC | PRN
  Administered 2020-11-10: 3 mL via INTRAVENOUS
  Filled 2020-11-10: qty 10

## 2020-11-10 MED ORDER — LEVOTHYROXINE SODIUM 100 MCG PO TABS
100.0000 ug | ORAL_TABLET | Freq: Every day | ORAL | Status: DC
  Administered 2020-11-10 – 2020-11-17 (×6): 100 ug via ORAL
  Filled 2020-11-10 (×3): qty 1
  Filled 2020-11-10: qty 2
  Filled 2020-11-10 (×2): qty 1

## 2020-11-10 MED ORDER — CHLORHEXIDINE GLUCONATE CLOTH 2 % EX PADS: 6.0000 | MEDICATED_PAD | Freq: Once | CUTANEOUS | Status: AC

## 2020-11-10 MED ORDER — HEPARIN BOLUS VIA INFUSION
1100.0000 [IU] | Freq: Once | INTRAVENOUS | Status: AC
  Administered 2020-11-10: 1100 [IU] via INTRAVENOUS
  Filled 2020-11-10: qty 1100

## 2020-11-10 MED ORDER — ENSURE MAX PROTEIN PO LIQD
11.0000 [oz_av] | Freq: Every day | ORAL | Status: DC
  Filled 2020-11-10: qty 330

## 2020-11-10 MED ORDER — INSULIN ASPART 100 UNIT/ML IJ SOLN
0.0000 [IU] | Freq: Three times a day (TID) | INTRAMUSCULAR | Status: DC
  Administered 2020-11-10 – 2020-11-13 (×2): 1 [IU] via SUBCUTANEOUS
  Filled 2020-11-10 (×4): qty 1

## 2020-11-10 MED ORDER — SODIUM CHLORIDE 0.9 % IV SOLN
INTRAVENOUS | Status: DC | PRN
  Administered 2020-11-23: 10 mL/h via INTRAVENOUS

## 2020-11-10 MED ORDER — JUVEN PO PACK
1.0000 | PACK | Freq: Two times a day (BID) | ORAL | Status: DC
  Administered 2020-11-12 – 2020-11-14 (×5): 1 via ORAL

## 2020-11-10 MED ORDER — FERROUS GLUCONATE 324 (38 FE) MG PO TABS
324.0000 mg | ORAL_TABLET | Freq: Every day | ORAL | Status: DC
  Administered 2020-11-11 – 2020-12-21 (×36): 324 mg via ORAL
  Filled 2020-11-10 (×45): qty 1

## 2020-11-10 MED ORDER — DILTIAZEM HCL 30 MG PO TABS
60.0000 mg | ORAL_TABLET | Freq: Four times a day (QID) | ORAL | Status: DC
  Administered 2020-11-10 – 2020-11-13 (×12): 60 mg via ORAL
  Filled 2020-11-10 (×9): qty 2
  Filled 2020-11-10: qty 1
  Filled 2020-11-10 (×2): qty 2

## 2020-11-10 MED ORDER — ALBUTEROL SULFATE (2.5 MG/3ML) 0.083% IN NEBU: 3.0000 mL | INHALATION_SOLUTION | Freq: Four times a day (QID) | RESPIRATORY_TRACT | Status: DC | PRN

## 2020-11-10 MED ORDER — ACETAMINOPHEN 325 MG PO TABS
650.0000 mg | ORAL_TABLET | Freq: Four times a day (QID) | ORAL | Status: DC | PRN
  Administered 2020-11-11 – 2020-11-12 (×2): 650 mg via ORAL
  Filled 2020-11-10 (×2): qty 2

## 2020-11-10 MED ORDER — VENLAFAXINE HCL 37.5 MG PO TABS
100.0000 mg | ORAL_TABLET | Freq: Two times a day (BID) | ORAL | Status: DC
  Administered 2020-11-10 – 2020-11-16 (×12): 100 mg via ORAL
  Filled 2020-11-10 (×19): qty 1

## 2020-11-10 MED ORDER — CHLORHEXIDINE GLUCONATE CLOTH 2 % EX PADS
6.0000 | MEDICATED_PAD | Freq: Once | CUTANEOUS | Status: AC
  Administered 2020-11-10: 6 via TOPICAL

## 2020-11-10 MED ORDER — METOPROLOL TARTRATE 25 MG PO TABS
25.0000 mg | ORAL_TABLET | Freq: Two times a day (BID) | ORAL | Status: DC
  Administered 2020-11-10 – 2020-11-16 (×12): 25 mg via ORAL
  Filled 2020-11-10 (×12): qty 1

## 2020-11-10 MED ORDER — HEPARIN BOLUS VIA INFUSION
2300.0000 [IU] | Freq: Once | INTRAVENOUS | Status: DC
  Filled 2020-11-10: qty 2300

## 2020-11-10 MED ORDER — INSULIN ASPART 100 UNIT/ML IJ SOLN: 0.0000 [IU] | Freq: Every day | INTRAMUSCULAR | Status: DC

## 2020-11-10 MED ORDER — ADULT MULTIVITAMIN W/MINERALS CH
1.0000 | ORAL_TABLET | Freq: Every day | ORAL | Status: DC
  Administered 2020-11-10 – 2020-11-16 (×6): 1 via ORAL
  Filled 2020-11-10 (×6): qty 1

## 2020-11-10 MED ORDER — VANCOMYCIN HCL IN DEXTROSE 1-5 GM/200ML-% IV SOLN
1000.0000 mg | INTRAVENOUS | Status: DC
  Administered 2020-11-10: 1000 mg via INTRAVENOUS
  Filled 2020-11-10: qty 200

## 2020-11-10 NOTE — Consult Note (Signed)
ANTICOAGULATION CONSULT NOTE - Initial Consult  Pharmacy Consult for Heparin gtt Indication: atrial fibrillation  Allergies  Allergen Reactions   Codeine Nausea And Vomiting    Patient Measurements: Height: 5\' 6"  (167.6 cm) Weight: 86.2 kg (190 lb) IBW/kg (Calculated) : 59.3 Heparin Dosing Weight: 77.8 kg  Vital Signs: Temp: 99.1 F (37.3 C) (10/30 2214) Temp Source: Oral (10/30 1549) BP: 129/91 (10/30 2214) Pulse Rate: 104 (10/30 2214)  Labs: Recent Labs    11/09/20 1220 11/09/20 1818 11/09/20 1825 11/10/20 0115 11/10/20 0649 11/10/20 1147 11/10/20 2059  HGB 8.0*  --   --   --  7.2*  --   --   HCT 24.5*  --   --   --  22.3*  --   --   PLT 190  --   --   --  180  --   --   APTT  --   --  41*  --   --   --   --   LABPROT  --  16.4*  --   --   --   --   --   INR  --  1.3*  --   --   --   --   --   HEPARINUNFRC  --   --   --  0.10*  --  0.28* 0.18*  CREATININE 3.65*  --   --   --  3.50*  --   --   CKTOTAL  --  184  --   --   --   --   --   TROPONINIHS 147* 151*  --  162* 149*  --   --      Estimated Creatinine Clearance: 17 mL/min (A) (by C-G formula based on SCr of 3.5 mg/dL (H)).   Medical History: Past Medical History:  Diagnosis Date   Diabetes mellitus without complication ([place])    Hypertension    Kidney stone    Thyroid disease     Medications:  Scheduled:   allopurinol  200 mg Oral Daily   Chlorhexidine Gluconate Cloth  6 each Topical Once   diltiazem  60 mg Oral Q6H   [START ON 11/11/2020] ferrous gluconate  324 mg Oral Q breakfast   heparin  2,300 Units Intravenous Once   insulin aspart  0-5 Units Subcutaneous QHS   insulin aspart  0-6 Units Subcutaneous TID WC   levothyroxine  100 mcg Oral Q0600   metoprolol tartrate  25 mg Oral BID   multivitamin with minerals  1 tablet Oral Daily   nutrition supplement (JUVEN)  1 packet Oral BID BM   Ensure Max Protein  11 oz Oral QHS   vancomycin variable dose per unstable renal function (pharmacist  dosing)   Does not apply See admin instructions   venlafaxine  100 mg Oral BID    Assessment: Patient admitted with new onset A/Fib and worsening renal function. Antibiotics for osteomyelitis were initiated and pharmacy consulted for heparin drip management.  Noted history of DM, CKD-stage 4,HTN, thyroid disorder, and liver cirrhosis secondary to NASH.  Baseline INR , PLT 190 and PTT  10/30:  HL @ 0115 = 0.10, subtherapeutic  10/30:  HL @ 2051 = 0.18, subtherapeutic   Goal of Therapy:  Heparin level 0.3-0.7 units/ml Monitor platelets by anticoagulation protocol: Yes   Plan:  10/30:  HL @ 2059 = 0.18, subtherapeutic Will increase drip rate to 1800 units/hr but no bolus per MD request.  Will recheck HL 8  hrs after rate change.   Autumn [name] D 11/10/2020 10:37 PM

## 2020-11-10 NOTE — Progress Notes (Signed)
Patient ID: Autumn [name], female   DOB: December 12, 1950, 70 y.o.   MRN: [id number] [facility] PROGRESS NOTE  Autumn [name] GLO:[id number] DOB: 10-22-1950 DOA: 11/09/2020 PCP: Harlow Ohms, MD  HPI/Subjective: Patient brought in with multiple falls.  Having a draining area from the bottom of her left foot starting as a callus.  MRI consistent with osteomyelitis.  Patient was also found to be in rapid atrial fibrillation and started on Cardizem drip.  Objective: Vitals:   11/10/20 0830 11/10/20 1030  BP: 126/76 126/66  Pulse: 93 80  Resp: 20 17  Temp:    SpO2: 94% 95%   No intake or output data in the 24 hours ending 11/10/20 1228 Filed Weights   11/09/20 1214  Weight: 86.2 kg    ROS: Review of Systems  Respiratory:  Negative for shortness of breath.   Cardiovascular:  Negative for chest pain.  Gastrointestinal:  Positive for nausea and vomiting. Negative for abdominal pain.  Musculoskeletal:  Positive for joint pain.  Exam: Physical Exam HENT:     Head: Normocephalic.     Mouth/Throat:     Pharynx: No oropharyngeal exudate.  Eyes:     General: Lids are normal.     Conjunctiva/sclera: Conjunctivae normal.  Cardiovascular:     Rate and Rhythm: Normal rate and regular rhythm.     Heart sounds: Normal heart sounds, S1 normal and S2 normal.  Pulmonary:     Breath sounds: No decreased breath sounds, wheezing, rhonchi or rales.  Abdominal:     Palpations: Abdomen is soft.     Tenderness: There is no abdominal tenderness.  Musculoskeletal:     Right ankle: Swelling present.     Left ankle: Swelling present.  Skin:    General: Skin is warm.     Comments: Erythema left foot.  A few draining ulcerations bottom of left foot.  Neurological:     Mental Status: She is alert and oriented to person, place, and time.      Scheduled Meds:  allopurinol  200 mg Oral Daily   diltiazem  60 mg Oral Q6H   [START ON 11/11/2020] ferrous gluconate  324 mg Oral Q breakfast    insulin aspart  0-5 Units Subcutaneous QHS   insulin aspart  0-6 Units Subcutaneous TID WC   levothyroxine  100 mcg Oral Q0600   metoprolol tartrate  25 mg Oral BID   vancomycin variable dose per unstable renal function (pharmacist dosing)   Does not apply See admin instructions   venlafaxine  100 mg Oral BID   Continuous Infusions:  ceFEPime (MAXIPIME) IV Stopped (11/09/20 1730)   heparin 1,400 Units/hr (11/10/20 1153)   lactated ringers 125 mL/hr at 11/09/20 2341   metronidazole Stopped (11/10/20 1884)    Assessment/Plan:  Clinical sepsis, present on admission with left foot osteomyelitis present on admission.  Patient had tachycardia tachypnea and leukocytosis.  Acute kidney injury on chronic kidney disease stage IV.  MRI of the left foot shows a abscess 2.1 x 1 cm and osteomyelitis of the third metatarsal head and second proximal phalanx and metatarsal head.  Case discussed with podiatry and they will take to the operating room tomorrow.  Patient on vancomycin metronidazole and Maxipime at this point.  Follow-up cultures.  Sedimentation rate and CRP elevated at 126 and 23.5 respectively. Atrial fibrillation with rapid ventricular response.  Patient placed on heparin drip for anticoagulation which can be held prior to surgery.  Patient was on Cardizem drip this  morning and this will be tapered off.  Short acting Cardizem and metoprolol ordered.  [place] cardiology consultation. Type 2 diabetes mellitus with chronic kidney disease stage IV.  Start sliding scale insulin.  Continue to monitor creatinine Anemia, likely of chronic disease.  Order ferritin.  Patient may end up needing a blood transfusion during the hospital course benefits and risks of transfusion explained to the patient. NASH with liver cirrhosis Frequent falls Elevated troponin likely demand ischemia from sepsis Hypokalemia replaced Hypothyroidism unspecified on levothyroxine     Code Status:     Code Status  Orders  (From admission, onward)           Start     Ordered   11/09/20 1556  Full code  Continuous        11/09/20 1555           Code Status History     This patient has a current code status but no historical code status.      Family Communication: Spoke with [name] Disposition Plan: Status is: Inpatient  Consultants: Podiatry Cardiology Nephrology  Antibiotics: Vancomycin, cefepime and Flagyl  Time spent: 32 minutes, case discussed with podiatry  Loletha Grayer  [facility]

## 2020-11-10 NOTE — Progress Notes (Addendum)
I have reviewed the MRI and I discussed with the patient. She just had some eggs. I discussed my recommendations to proceed with surgery.  She has continued to have at least a partial second ray amputation and possible third or at least third metatarsal head at this point.  Discussed she is need to have a serial debridement and return for likely closure of the wound later in the week.  I did discuss need for possible transmetatarsal amputation.  I discussed with her doing surgery today.  She just had some eggs and she states that she wants to wait until tomorrow.  I will contact her friend at her request as well.  We will schedule surgery for tomorrow.  I discussed risks of surgery including spread of infection, delayed or nonhealing, further amputation, loss of foot as well as general risks of surgery.  We will plan for surgery tomorrow with Dr. Sherryle Lis.  I spoke with the patients friend, [name], and went over the plan as well.   Will update Dr. Sherryle Lis who will be covering tomorrow.

## 2020-11-10 NOTE — Consult Note (Signed)
Cardiology Consultation:   Patient ID: Autumn [name]; [id number]; [birth year]   Admit date: 11/09/2020 Date of Consult: 11/10/2020  Primary Care Provider: Harlow Ohms, MD Primary Cardiologist: New to [facility] - consult by Oval Linsey (imaging through [facility]) Primary Electrophysiologist:  None   Patient Profile:   Autumn [name] is a 70 y.o. female with a hx of HFpEF, CKD stage IV-V, diabetes since her late 2s to early 46s with diabetic neuropathy and diabetic foot ulcer, HTN, anemia of chronic disease, hepatic cirrhosis, sleep apnea, and gout who is being seen today for the evaluation of Afib with RVR at the request of Dr. Francine Graven.  History of Present Illness:   Autumn [name] has previously undergone cardiac imaging through [facility] with Lexiscan MPI in 08/2015, that was probably low risk. There was a moderate in size, mild in severity defect involving the apical inferior, mid inferolateral and basal inferolateral segment, consistent with possible artifact but could not rule out subtle scar and / or ischemia. LVSF normal. With dyssynchronous septal motion. She was admitted to [facility] in 05/2020 with volume overload with decompensated hepatic cirrhosis. Echo at that time showing an EF of 6065%, mildly increased LV wall thickness, Gr1DD, moderately dilated left atrium, PASP 41 mmHg, and mild mitral annular calcification. She was diuresed with IV Lasix and metolazone.   She was admitted to [facility] on 11/09/2020 with after waking up on the floor. Family has indicated she has had multiple falls this prior week with associated trauma to the head without reported LOC. Vitals stable. Labs: an initial troponin of 147 with a delta of 151, and peaked at 162. BNP 360.  Sodium 132, potassium 3.2, BUN 69, SCr 3.65, WBC 15.8, HGB 8.0 trending to 7.2. Covid negative.  Albumin 2.7. AST/ALT normal. INR 1.3. Magnesium 2.0, TSH normal. CK normal. EKG showed Afib with RVR with ventricular rates in the 130s bpm. With her  diabetic foot ulcer, she underwent plain film of the left foot which was consistent with osteomyelitis. CXR showed no evidence of cardiopulmonary disease. CT head was without acute intracranial abnormality. Lower extremity ultrasound was negative for DVT in the left leg. In the ED, she received IV cefepime, vancomycin, Flagyl, IV diltiazem, oral Lopressor, and was placed on a diltiazem and heparin gtt.   EKGs in [facility] Care Everywhere demonstrate sinus rhythm along with sinus bradycardia. Telemetry shows Afib with RVR initially with ventricular rates in the 130s to 140s bpm, improved to the 80s bpm currently. She denies any prior known diagnosis of Afib. She has been without chest pain, dyspnea, palpitations, dizziness, presyncope, or syncope. She has a history of chronic left lower extremity weakness that has been progressive. With this, her gait has become more unsteady and led to an increase in falls. Currently, she is without chest pain, palpitations, dyspnea, dizziness, presyncope, or syncope. No orthopnea, PND, or early satiety.     Past Medical History:  Diagnosis Date   Diabetes mellitus without complication ([place])    Hypertension    Kidney stone    Thyroid disease     Past Surgical History:  Procedure Laterality Date   ABDOMINAL HYSTERECTOMY     URETERAL STENT PLACEMENT       Home Meds: Prior to Admission medications   Medication Sig Start Date End Date Taking? Authorizing Provider  acetaminophen (TYLENOL) 325 MG tablet Take 325 mg by mouth every 6 (six) hours as needed.   Yes [provider]  albuterol (VENTOLIN HFA) 108 (  90 Base) MCG/ACT inhaler Inhale 2 puffs into the lungs 4 (four) times daily as needed. 03/25/18  Yes [provider]  allopurinol (ZYLOPRIM) 100 MG tablet Take 200 mg by mouth daily. 10/25/20  Yes [provider]  amLODipine (NORVASC) 5 MG tablet Take 5 mg by mouth daily. 10/11/20  Yes [provider]  aspirin 81 MG EC tablet  Take 81 mg by mouth at bedtime. 06/09/06  Yes [provider]  bumetanide (BUMEX) 2 MG tablet Take 4 mg by mouth 2 (two) times daily. 10/29/20 10/29/21 Yes [provider]  cloNIDine (CATAPRES) 0.3 MG tablet Take 0.3 mg by mouth at bedtime. 11/05/20  Yes [provider]  hydrALAZINE (APRESOLINE) 25 MG tablet Take 50 mg by mouth 3 (three) times daily. 09/04/20  Yes [provider]  levothyroxine (SYNTHROID) 100 MCG tablet Take 100 mcg by mouth every morning. 10/04/20  Yes [provider]  liraglutide (VICTOZA) 18 MG/3ML SOPN Inject 1.2 mg into the skin in the morning. 10/07/18 07/03/21 Yes [provider]  lisinopril (ZESTRIL) 20 MG tablet Take 40 mg by mouth daily. 09/28/20  Yes [provider]  potassium chloride SA (KLOR-CON) 20 MEQ tablet Take 20 mEq by mouth daily. 04/29/20 04/29/21 Yes [provider]  simvastatin (ZOCOR) 20 MG tablet Take 20 mg by mouth every evening. 08/29/18  Yes [provider]  venlafaxine (EFFEXOR) 100 MG tablet Take 100 mg by mouth 2 (two) times daily. 10/21/20  Yes [provider]  meclizine (ANTIVERT) 12.5 MG tablet Take 12.5 mg by mouth 3 (three) times daily as needed. 08/30/20   [provider]  metoCLOPramide (REGLAN) 5 MG tablet Take 5 mg by mouth daily as needed. 11/05/20   [provider]    Inpatient Medications: Scheduled Meds:  allopurinol  200 mg Oral Daily   levothyroxine  100 mcg Oral Q0600   metoprolol tartrate  25 mg Oral BID   vancomycin variable dose per unstable renal function (pharmacist dosing)   Does not apply See admin instructions   venlafaxine  100 mg Oral BID   Continuous Infusions:  ceFEPime (MAXIPIME) IV Stopped (11/09/20 1730)   diltiazem (CARDIZEM) infusion 15 mg/hr (11/10/20 0742)   heparin 1,400 Units/hr (11/10/20 0345)   lactated ringers 125 mL/hr at 11/09/20 2341   metronidazole 500 mg (11/10/20 0700)   [START ON 11/11/2020]  vancomycin     PRN Meds: acetaminophen **OR** acetaminophen, albuterol, meclizine, ondansetron **OR** ondansetron (ZOFRAN) IV  Allergies:   Allergies  Allergen Reactions   Codeine Nausea And Vomiting    Social History:   Social History   Socioeconomic History   Marital status: Single    Spouse name: Not on file   Number of children: Not on file   Years of education: Not on file   Highest education level: Not on file  Occupational History   Not on file  Tobacco Use   Smoking status: Former   Smokeless tobacco: Never  Substance and Sexual Activity   Alcohol use: Never   Drug use: Never   Sexual activity: Not on file  Other Topics Concern   Not on file  Social History Narrative   Not on file   Social Determinants of Health   Financial Resource Strain: Not on file  Food Insecurity: Not on file  Transportation Needs: Not on file  Physical Activity: Not on file  Stress: Not on file  Social Connections: Not on file  Intimate Partner Violence: Not on file  Family History:   Family History  Problem Relation Age of Onset   Hypertension Mother     ROS:  Review of Systems  Constitutional:  Positive for malaise/fatigue. Negative for chills, diaphoresis, fever and weight loss.  HENT:  Negative for congestion.   Eyes:  Negative for discharge and redness.  Respiratory:  Negative for cough, hemoptysis, sputum production, shortness of breath and wheezing.   Cardiovascular:  Positive for leg swelling. Negative for chest pain, palpitations, orthopnea, claudication and PND.  Gastrointestinal:  Negative for abdominal pain, blood in stool, heartburn, melena, nausea and vomiting.  Genitourinary:  Negative for hematuria.  Musculoskeletal:  Positive for falls. Negative for myalgias.  Skin:  Negative for rash.  Neurological:  Positive for focal weakness and weakness. Negative for dizziness, tingling, tremors, sensory change, speech change and loss of consciousness.        Progressive left lower extremity weakness over the prior 3 years  Endo/Heme/Allergies:  Does not bruise/bleed easily.  Psychiatric/Behavioral:  Negative for substance abuse. The patient is not nervous/anxious.   All other systems reviewed and are negative.    Physical Exam/Data:   Vitals:   11/10/20 0500 11/10/20 0530 11/10/20 0600 11/10/20 0700  BP: (!) 147/80 (!) 144/79 (!) 141/94 136/79  Pulse: 90 81 (!) 103 85  Resp: (!) 33 (!) 28 (!) 27 (!) 21  Temp:      SpO2:  91% 92% 93%  Weight:      Height:        Intake/Output Summary (Last 24 hours) at 11/10/2020 0829 Last data filed at 11/09/2020 1208 Gross per 24 hour  Intake 500 ml  Output --  Net 500 ml   Filed Weights   11/09/20 1214  Weight: 86.2 kg   Body mass index is 30.67 kg/m.   Physical Exam: General: Well developed, well nourished, in no acute distress. Poor dentition.  Head: Normocephalic, atraumatic, sclera non-icteric, no xanthomas, nares without discharge.  Neck: Negative for carotid bruits. JVD not elevated. Lungs: Clear bilaterally to auscultation without wheezes, rales, or rhonchi. Breathing is unlabored. Heart: IRIR with S1 S2. No murmurs, rubs, or gallops appreciated. Abdomen: Soft, non-tender, non-distended with normoactive bowel sounds. No hepatomegaly. No rebound/guarding. No obvious abdominal masses. Msk:  Strength and tone appear normal for age. Extremities: No clubbing or cyanosis. Mild edema. Distal pedal pulses are 2+ and equal bilaterally. Neuro: Alert and oriented X 3. No facial asymmetry. No focal deficit. Moves all extremities spontaneously. Psych:  Responds to questions appropriately with a normal affect.   EKG:  The EKG was personally reviewed and demonstrates: Afib with RVR, 136 bpm, nonspecific st/t changes Telemetry:  Telemetry was personally reviewed and demonstrates: Afib with RVR initially with ventricular rates in the 130s to 140s bpm, currently remains in Afib with ventricular rates  in the 80s bpm  Weights: Filed Weights   11/09/20 1214  Weight: 86.2 kg    Relevant CV Studies:  2D echo pending __________  2D echo 05/2020 [facility]): 1. The left ventricle is normal in size with mildly increased wall  thickness.  2. The left ventricular systolic function is normal, LVEF is visually  estimated at 60-65%.  3. There is grade I diastolic dysfunction (impaired relaxation).  4. Mitral annular calcification is present (mild).  5. The left atrium is moderately dilated in size.  6. The right ventricle is normal in size, with normal systolic function.  7. There is mild pulmonary hypertension, estimated pulmonary artery systolic  pressure is 41 mmHg.  8. IVC size and inspiratory change suggest mildly elevated right atrial  pressure. (5-10 mmHg).  __________  2D echo 05/2018 [facility]):  Left ventricular hypertrophy - mild   Normal left ventricular systolic function, ejection fraction > 55%   Mitral annular calcification   Degenerative mitral valve disease   Dilated left atrium - mild   Normal right ventricular systolic function __________  Carlton Adam MPI 08/2015: - Probably Low risk study  - There is a moderate in size, mild in severity defect involving the  apical inferior, mid inferolateral and basal inferolateral segment  consistent with possible artifact but cannot rule out subtle scar and / or  ischemia.  - Post stress: Global systolic function is normal. The ejection fraction  was greater than 65%. Septal motion is dyssynchronous.  - Sensitivity and specificity of this test are reduced by the noted  attenuation, gut activity and arm at side  - Coronary calcifications are noted  - In the future, for larger patients or those with attenuation issues,  consider ordering PET rubidium study   Laboratory Data:  Chemistry Recent Labs  Lab 11/09/20 1220 11/10/20 0649  NA 132* 132*  K 3.2* 3.5  CL 102 103  CO2 18* 18*  GLUCOSE 164* 152*  BUN 69* 64*   CREATININE 3.65* 3.50*  CALCIUM 8.4* 8.2*  GFRNONAA 13* 14*  ANIONGAP 12 11    Recent Labs  Lab 11/09/20 1220  PROT 6.4*  ALBUMIN 2.7*  AST 35  ALT 25  ALKPHOS 84  BILITOT 0.4   Hematology Recent Labs  Lab 11/09/20 1220 11/10/20 0649  WBC 15.8* 13.5*  RBC 2.84* 2.61*  HGB 8.0* 7.2*  HCT 24.5* 22.3*  MCV 86.3 85.4  MCH 28.2 27.6  MCHC 32.7 32.3  RDW 14.5 14.6  PLT 190 180   Cardiac EnzymesNo results for input(s): TROPONINI in the last 168 hours. No results for input(s): TROPIPOC in the last 168 hours.  BNP Recent Labs  Lab 11/09/20 1220  BNP 360.1*    DDimer No results for input(s): DDIMER in the last 168 hours.  Radiology/Studies:  CT Head Wo Contrast  Result Date: 11/09/2020 IMPRESSION: No evidence of acute intracranial abnormality. Electronically Signed   By: Margarette Canada M.D.   On: 11/09/2020 13:05   US RENAL  Result Date: 11/09/2020 IMPRESSION: 1. Increased bilateral renal parenchymal echogenicity consistent with chronic medical renal disease. No obstructive uropathy. 2. Simple cyst in the upper left kidney. 3. Incidental splenomegaly. Electronically Signed   By: Keith Rake M.D.   On: 11/09/2020 17:12   US Venous Img Lower Unilateral Left  Result Date: 11/09/2020 IMPRESSION: No evidence of deep venous thrombosis.  Calf edema is present. Electronically Signed   By: Albin Felling M.D.   On: 11/09/2020 14:44   DG Chest Portable 1 View  Result Date: 11/09/2020 IMPRESSION: No active disease. Electronically Signed   By: Margarette Canada M.D.   On: 11/09/2020 13:06   DG Foot Complete Left  Result Date: 11/09/2020 IMPRESSION: Cortical irregularity of the third metatarsal head concerning for osteomyelitis. Irregularity of the second metatarsal head concerning for osteomyelitis. Mild periosteal reaction involving the second and third metatarsal shafts. Soft tissue swelling involving the foot and ankle which may be reactive versus secondary to cellulitis.  Electronically Signed   By: Kathreen Devoid M.D.   On: 11/09/2020 13:45    Assessment and Plan:   1. New onset Afib with RVR: -Uncertain chronicity  -Possibly in the context of her acute  illness  -She presented in Afib with RVR with ventricular rates in the 130s to 140s bpm -Currently she remains in Afib with controlled ventricular response with ventricular rates in the 80s bpm -Transition off diltiazem gtt to short-acting diltiazem  -Continue Loproessor -Pursue rate control strategy at this time given she has not been anticoagulated prior to admission and the chronicity of her Afib is uncertain -[id number] at least 6 (CHF, HTN, age x 1, DM, vascular disease, sex category) -Heparin gtt until it is clear she does not require invasive inpatient procedures  -Will need to determine if she is a candidate for [facility] at discharge, pending HGB, and PT evaluation given recent falls -If she is a long-term [place] candidate, would look to pursue DCCV after she has been adequately anticoagulated for 3-4 weeks as an outpatient -If needed, could consider TEE-guided DCCV prior to discharge, though with controlled ventricular response, and acute illness this is less likely indicated at this time -TSH normal -Replete potassium to goal 4.0 -Echo pending -Needs CPAP while admitted   2. Elevated high sensitivity troponin: -No symptoms of angina -Minimally elevated and flat trending, not consistent with ACS -Likely supply demand ischemia in the setting of osteomyelitis, Afib with RVR, CKD stage IV-V, and anemia of chronic disease -She is currently on a heparin gtt, though this is for Afib -Echo pending -Would benefit from noninvasive ischemic testing once her acute illness is improved -Check lipid panel for further risk stratification  -A1c 6.3  3. HFpEF: -Appears euvolemic  -Maintain net equal to slightly negative fluid balance  -Echo pending -Watch IV fluids  4. Falls: -She has progressive left lower  extremity weakness over the past 3 years, which she has attributed to her increase in falls -Denies syncope -PT/OT  Remaining noncardiac issues per primary service    For questions or updates, please contact [facility] Please consult [email or web address] for contact info under Cardiology/STEMI.   Signed, Christell Faith, PA-C [place] Pager: [phone] 11/10/2020, 8:29 AM

## 2020-11-10 NOTE — Consult Note (Signed)
ANTICOAGULATION CONSULT NOTE - Initial Consult  Pharmacy Consult for Heparin gtt Indication: atrial fibrillation  Allergies  Allergen Reactions   Codeine Nausea And Vomiting    Patient Measurements: Height: 5\' 6"  (167.6 cm) Weight: 86.2 kg (190 lb) IBW/kg (Calculated) : 59.3 Heparin Dosing Weight: 77.8 kg  Vital Signs: BP: 126/66 (10/30 1030) Pulse Rate: 80 (10/30 1030)  Labs: Recent Labs    11/09/20 1220 11/09/20 1818 11/09/20 1825 11/10/20 0115 11/10/20 0649 11/10/20 1147  HGB 8.0*  --   --   --  7.2*  --   HCT 24.5*  --   --   --  22.3*  --   PLT 190  --   --   --  180  --   APTT  --   --  41*  --   --   --   LABPROT  --  16.4*  --   --   --   --   INR  --  1.3*  --   --   --   --   HEPARINUNFRC  --   --   --  0.10*  --  0.28*  CREATININE 3.65*  --   --   --  3.50*  --   CKTOTAL  --  184  --   --   --   --   TROPONINIHS 147* 151*  --  162* 149*  --      Estimated Creatinine Clearance: 17 mL/min (A) (by C-G formula based on SCr of 3.5 mg/dL (H)).   Medical History: Past Medical History:  Diagnosis Date   Diabetes mellitus without complication ([place])    Hypertension    Kidney stone    Thyroid disease     Medications:  Scheduled:   allopurinol  200 mg Oral Daily   diltiazem  60 mg Oral Q6H   [START ON 11/11/2020] ferrous gluconate  324 mg Oral Q breakfast   insulin aspart  0-5 Units Subcutaneous QHS   insulin aspart  0-6 Units Subcutaneous TID WC   levothyroxine  100 mcg Oral Q0600   metoprolol tartrate  25 mg Oral BID   multivitamin with minerals  1 tablet Oral Daily   nutrition supplement (JUVEN)  1 packet Oral BID BM   Ensure Max Protein  11 oz Oral QHS   vancomycin variable dose per unstable renal function (pharmacist dosing)   Does not apply See admin instructions   venlafaxine  100 mg Oral BID    Assessment: Patient admitted with new onset A/Fib and worsening renal function. Antibiotics for osteomyelitis were initiated and pharmacy consulted  for heparin drip management.  Noted history of DM, CKD-stage 4,HTN, thyroid disorder, and liver cirrhosis secondary to NASH.  Baseline INR , PLT 190 and PTT  10/30:  HL @ 0115 = 0.10, subtherapeutic   Goal of Therapy:  Heparin level 0.3-0.7 units/ml Monitor platelets by anticoagulation protocol: Yes   Plan:  10/30:  HL @ 1147 = 0.28, subtherapeutic  Bolus 1100 units x 1 and increase heparin drip to 1550 units/hr but no bolus per MD request.  Will recheck HL 8 hrs after rate change.   Autumn [name] 11/10/2020 1:14 PM

## 2020-11-10 NOTE — TOC Initial Note (Signed)
Transition of Care [facility]) - Initial/Assessment Note    Patient Details  Name: Autumn [name] MRN: [id number] Date of Birth: 04/15/1950  Transition of Care [facility]) CM/SW Contact:    Ova Freshwater Phone Number: [phone] 11/10/2020, 12:34 PM  Clinical Narrative:                  Presents to [facility] due to multiple falls and in most recent fall patient could not remember how she ended up on the floor.  The patient has hx of  diabetes mellitus with complications of stage IV-V chronic kidney disease, history of liver cirrhosis secondary to NASH, hypertension, thyroid disease and a left foot diabetic ulcer.  Patient has hx left foot surgery.  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 Podiatrist spoke with patient and has recommended surgery on the foot, w/ possible amputation today, but patient stated she would like to wait until tomorrow. CSW called and left voicemail for patient.  Patient's main contact is Autumn [name] (Friend)  [phone] [facility]).  TOC will continue to follow.  Expected Discharge Plan: [place] Barriers to Discharge: Continued Medical Work up   Patient Goals and CMS Choice        Expected Discharge Plan and Services Expected Discharge Plan: [place]       Living arrangements for the past 2 months: Single Family Home                                      Prior Living Arrangements/Services Living arrangements for the past 2 months: Single Family Home Lives with:: Self Patient language and need for interpreter reviewed:: Yes Do you feel safe going back to the place where you live?: Yes      Need for Family Participation in Patient Care: Yes (Comment) Care giver support system in place?: No (comment)   Criminal Activity/Legal Involvement Pertinent to Current Situation/Hospitalization: No - Comment as needed  Activities of Daily Living      Permission  Sought/Granted Permission sought to share information with : Other (comment) (friend) Permission granted to share information with : Yes, Verbal Permission Granted  Share Information with NAME: Autumn [name] (Friend)   [phone] [facility])           Emotional Assessment Appearance:: Appears stated age Attitude/Demeanor/Rapport: Engaged Affect (typically observed): Stable Orientation: : Oriented to Self, Oriented to Place, Oriented to  Time, Oriented to Situation Alcohol / Substance Use: Not Applicable Psych Involvement: No (comment)  Admission diagnosis:  Atrial fibrillation with rapid ventricular response ([place]) [I48.91] Patient Active Problem List   Diagnosis Date Noted   Atrial fibrillation with rapid ventricular response ([place]) 11/09/2020   Foot osteomyelitis, left ([place]) 11/09/2020   CKD stage 5 due to type 2 diabetes mellitus ([place]) 11/09/2020   Liver cirrhosis secondary to NASH (nonalcoholic steatohepatitis) ([place]) 11/09/2020   Hypertension    History of anemia due to CKD    Sepsis ([place])    Frequent falls    PCP:  Harlow Ohms, MD Pharmacy:   [place], [place] - [address] [place]  [address] Phone: [phone] Fax: [phone]     Social Determinants of Health (SDOH) Interventions    Readmission Risk Interventions No flowsheet data found.

## 2020-11-10 NOTE — Progress Notes (Signed)
Patient arrived to 259 in NAD, VS stable and patient free from pain. Patient oriented to room and call bell in reach, bed alarm on.

## 2020-11-10 NOTE — Consult Note (Signed)
ANTICOAGULATION CONSULT NOTE - Initial Consult  Pharmacy Consult for Heparin gtt Indication: atrial fibrillation  Allergies  Allergen Reactions   Codeine Nausea And Vomiting    Patient Measurements: Height: 5\' 6"  (167.6 cm) Weight: 86.2 kg (190 lb) IBW/kg (Calculated) : 59.3 Heparin Dosing Weight: 77.8 kg  Vital Signs: BP: 130/81 (10/30 0100) Pulse Rate: 90 (10/30 0100)  Labs: Recent Labs    11/09/20 1220 11/09/20 1818 11/09/20 1825 11/10/20 0115  HGB 8.0*  --   --   --   HCT 24.5*  --   --   --   PLT 190  --   --   --   APTT  --   --  41*  --   LABPROT  --  16.4*  --   --   INR  --  1.3*  --   --   HEPARINUNFRC  --   --   --  0.10*  CREATININE 3.65*  --   --   --   CKTOTAL  --  184  --   --   TROPONINIHS 147* 151*  --  162*     Estimated Creatinine Clearance: 16.3 mL/min (A) (by C-G formula based on SCr of 3.65 mg/dL (H)).   Medical History: Past Medical History:  Diagnosis Date   Diabetes mellitus without complication ([place])    Hypertension    Kidney stone    Thyroid disease     Medications:  Scheduled:   vancomycin variable dose per unstable renal function (pharmacist dosing)   Does not apply See admin instructions    Assessment: Patient admitted with new onset A/Fib and worsening renal function. Antibiotics for osteomyelitis were initiated and pharmacy consulted for heparin drip management.  Noted history of DM, CKD-stage 4,HTN, thyroid disorder, and liver cirrhosis secondary to NASH.  Baseline INR , PLT 190 and PTT  10/30:  HL @ 0115 = 0.10, subtherapeutic   Goal of Therapy:  Heparin level 0.3-0.7 units/ml Monitor platelets by anticoagulation protocol: Yes   Plan:  10/30:  HL @ 0115 = 0.10, subtherapeutic  Will increase heparin drip to 1400 units/hr but no bolus per MD request.  Will recheck HL 8 hrs after rate change.   Brendalyn Vallely D 11/10/2020 3:48 AM

## 2020-11-10 NOTE — ED Notes (Signed)
Pt sitting at bedside endorsing nausea

## 2020-11-10 NOTE — Progress Notes (Signed)
Inpatient Diabetes Program Recommendations  AACE/ADA: New Consensus Statement on Inpatient Glycemic Control   Target Ranges:  Prepandial:   less than 140 mg/dL      Peak postprandial:   less than 180 mg/dL (1-2 hours)      Critically ill patients:  140 - 180 mg/dL   Results for Autumn [name], Autumn [name] (MRN [id number]) as of 11/10/2020 08:06  Ref. Range 11/09/2020 12:20 11/09/2020 18:25 11/10/2020 06:49  Glucose Latest Ref Range: 70 - 99 mg/dL 164 (H)  152 (H)  Hemoglobin A1C Latest Ref Range: 4.8 - 5.6 %  6.3 (H)     Review of Glycemic Control  Diabetes history: DM2 Outpatient Diabetes medications: Victoza 1.2 mg daily Current orders for Inpatient glycemic control: None  Inpatient Diabetes Program Recommendations:    Insulin: Please consider ordering CBGs AC&HS with Novolog 0-6 units AC&HS.  NOTE: Noted consult for Diabetes Coordinator. Diabetes Coordinator is not on campus over the weekend but available by pager from 8am to 5pm for questions or concerns. Per chart, patient admitted with sepsis from osteomyelitis. Patient has DM2 hx and takes Victoza outpatient. Lab glucose 152 mg/dl this morning. Recommend ordering CBGs and Novolog 0-6 units AC&HS while inpatient.  Thanks, Barnie Alderman, RN, MSN, CDE Diabetes Coordinator Inpatient Diabetes Program [phone] (Team Pager from 8am to 5pm)

## 2020-11-10 NOTE — ED Notes (Signed)
Pt L foot wound rinsed and dressed with gauze bandage

## 2020-11-10 NOTE — Consult Note (Signed)
[facility] Consult Note: 11/10/2020    Date of Admission:  11/09/2020           Reason for Consult:     Referring Provider: Loletha Grayer, MD Primary Care Provider: Harlow Ohms, MD   History of Presenting Illness:  Autumn [name] is a 70 y.o. female   Patient has known chronic kidney disease presumably secondary to diabetes and hypertensive nephrosclerosis.  She is followed as outpatient by [facility] nephrology She was last seen in August 2022.  Review of care everywhere records show that as outpatient, her baseline creatinine is 3.19, GFR 15 from September 27, 2020 This time she is admitted for left foot infection/osteomyelitis.  She has been evaluated by podiatry service and is scheduled for ray amputation tomorrow  Review of Systems: ROS  Gen: Denies any fevers or chills HEENT: No vision or hearing problems CV: No chest pain or shortness of breath Resp: No cough or sputum production GI: No nausea, vomiting or diarrhea.  No blood in the stool GU : No problems with voiding.  No hematuria.  No previous history of kidney problems MS: Infection in the left foot Derm:   No complaints Psych: No complaints Heme: No complaints Neuro: No complaints Endocrine: No complaints   Past Medical History:  Diagnosis Date   Diabetes mellitus without complication ([place])    Hypertension    Kidney stone    Thyroid disease     Social History   Tobacco Use   Smoking status: Former   Smokeless tobacco: Never  Substance Use Topics   Alcohol use: Never   Drug use: Never    Family History  Problem Relation Age of Onset   Hypertension Mother      OBJECTIVE: Blood pressure 126/76, pulse 93, temperature 99.4 F (37.4 C), resp. rate 20, height 5\' 6"  (1.676 m), weight 86.2 kg, SpO2 94 %.  Physical Exam Physical Exam: General:  No acute distress, laying in the bed  HEENT  anicteric, moist oral mucous membrane  Pulm/lungs  normal breathing effort, lungs  are clear to auscultation  CVS/Heart  irregular rhythm, no rub or gallop  Abdomen:   Soft, nontender  Extremities:  No peripheral edema, left foot redness and swelling  Neurologic:  Alert, oriented, able to follow commands  Skin:  No acute rashes     Lab Results Lab Results  Component Value Date   WBC 13.5 (H) 11/10/2020   HGB 7.2 (L) 11/10/2020   HCT 22.3 (L) 11/10/2020   MCV 85.4 11/10/2020   PLT 180 11/10/2020    Lab Results  Component Value Date   CREATININE 3.50 (H) 11/10/2020   BUN 64 (H) 11/10/2020   NA 132 (L) 11/10/2020   K 3.5 11/10/2020   CL 103 11/10/2020   CO2 18 (L) 11/10/2020    Lab Results  Component Value Date   ALT 25 11/09/2020   AST 35 11/09/2020   ALKPHOS 84 11/09/2020   BILITOT 0.4 11/09/2020     Microbiology: Recent Results (from the past 240 hour(s))  Resp Panel by RT-PCR (Flu A&B, Covid) Nasopharyngeal Swab     Status: None   Collection Time: 11/09/20  6:25 PM   Specimen: Nasopharyngeal Swab; Nasopharyngeal(NP) swabs in vial transport medium  Result Value Ref Range Status   SARS Coronavirus 2 by RT PCR NEGATIVE NEGATIVE Final    Comment: (NOTE) SARS-CoV-2 target nucleic acids are NOT DETECTED.  The SARS-CoV-2 RNA is generally detectable in upper respiratory specimens  during the acute phase of infection. The lowest concentration of SARS-CoV-2 viral copies this assay can detect is 138 copies/mL. A negative result does not preclude SARS-Cov-2 infection and should not be used as the sole basis for treatment or other patient management decisions. A negative result may occur with  improper specimen collection/handling, submission of specimen other than nasopharyngeal swab, presence of viral mutation(s) within the areas targeted by this assay, and inadequate number of viral copies(<138 copies/mL). A negative result must be combined with clinical observations, patient history, and epidemiological information. The expected result is  Negative.  Fact Sheet for Patients:  EntrepreneurPulse.com.au  Fact Sheet for Healthcare Providers:  IncredibleEmployment.be  This test is no t yet approved or cleared by the [place] FDA and  has been authorized for detection and/or diagnosis of SARS-CoV-2 by FDA under an Emergency Use Authorization (EUA). This EUA will remain  in effect (meaning this test can be used) for the duration of the COVID-19 declaration under Section 564(b)(1) of the Act, 21 U.S.C.section 360bbb-3(b)(1), unless the authorization is terminated  or revoked sooner.       Influenza A by PCR NEGATIVE NEGATIVE Final   Influenza B by PCR NEGATIVE NEGATIVE Final    Comment: (NOTE) The Xpert Xpress SARS-CoV-2/FLU/RSV plus assay is intended as an aid in the diagnosis of influenza from Nasopharyngeal swab specimens and should not be used as a sole basis for treatment. Nasal washings and aspirates are unacceptable for Xpert Xpress SARS-CoV-2/FLU/RSV testing.  Fact Sheet for Patients: EntrepreneurPulse.com.au  Fact Sheet for Healthcare Providers: IncredibleEmployment.be  This test is not yet approved or cleared by the [place] FDA and has been authorized for detection and/or diagnosis of SARS-CoV-2 by FDA under an Emergency Use Authorization (EUA). This EUA will remain in effect (meaning this test can be used) for the duration of the COVID-19 declaration under Section 564(b)(1) of the Act, 21 U.S.C. section 360bbb-3(b)(1), unless the authorization is terminated or revoked.  Performed at [facility], [address] [place], [place] [address]   MRSA Next Gen by PCR, Nasal     Status: None   Collection Time: 11/10/20  1:14 AM   Specimen: Nasal Mucosa; Nasal Swab  Result Value Ref Range Status   MRSA by PCR Next Gen NOT DETECTED NOT DETECTED Final    Comment: (NOTE) The GeneXpert MRSA Assay (FDA approved for NASAL  specimens only), is one component of a comprehensive MRSA colonization surveillance program. It is not intended to diagnose MRSA infection nor to guide or monitor treatment for MRSA infections. Test performance is not FDA approved in patients less than 3 years old. Performed at [facility], [address] [place], [place] [address]     Medications: Scheduled Meds:  allopurinol  200 mg Oral Daily   diltiazem  60 mg Oral Q6H   levothyroxine  100 mcg Oral Q0600   metoprolol tartrate  25 mg Oral BID   vancomycin variable dose per unstable renal function (pharmacist dosing)   Does not apply See admin instructions   venlafaxine  100 mg Oral BID   Continuous Infusions:  ceFEPime (MAXIPIME) IV Stopped (11/09/20 1730)   heparin 1,400 Units/hr (11/10/20 0345)   lactated ringers 125 mL/hr at 11/09/20 2341   metronidazole Stopped (11/10/20 0842)   [START ON 11/11/2020] vancomycin     PRN Meds:.acetaminophen **OR** acetaminophen, albuterol, meclizine, ondansetron **OR** ondansetron (ZOFRAN) IV  Allergies  Allergen Reactions   Codeine Nausea And Vomiting    Urinalysis: Recent  Labs    11/10/20 0120  COLORURINE YELLOW*  LABSPEC 1.013  PHURINE 5.0  GLUCOSEU NEGATIVE  HGBUR NEGATIVE  BILIRUBINUR NEGATIVE  KETONESUR NEGATIVE  PROTEINUR 100*  NITRITE NEGATIVE  LEUKOCYTESUR NEGATIVE      Imaging: CT Head Wo Contrast  Result Date: 11/09/2020 CLINICAL DATA:  70 year old female with acute altered mental status. EXAM: CT HEAD WITHOUT CONTRAST TECHNIQUE: Contiguous axial images were obtained from the base of the skull through the vertex without intravenous contrast. COMPARISON:  None. FINDINGS: Brain: No evidence of acute infarction, hemorrhage, hydrocephalus, extra-axial collection or mass lesion/mass effect. Vascular: Carotid and vertebral atherosclerotic calcifications are noted. Skull: No acute abnormality Sinuses/Orbits: No acute abnormality Other: None IMPRESSION: No  evidence of acute intracranial abnormality. Electronically Signed   By: Margarette Canada M.D.   On: 11/09/2020 13:05   US RENAL  Result Date: 11/09/2020 CLINICAL DATA:  Acute kidney injury. EXAM: RENAL / URINARY TRACT ULTRASOUND COMPLETE COMPARISON:  None. FINDINGS: Right Kidney: Renal measurements: 11.3 x 5.2 x 4.5 cm = volume: 138 mL. Increased parenchymal echogenicity. No hydronephrosis. No visualized cystic or solid lesion. No stone. Left Kidney: Renal measurements: 8.7 x 4.9 x 5.4 cm = volume: 120 mL. Increased parenchymal echogenicity. No hydronephrosis. There is a 1 cm cyst in the upper pole. No evidence of solid lesion or stone. Bladder: Appears normal for degree of bladder distention. Other: Incidental splenomegaly with spleen spanning 14.3 x 12.4 x 5.6 cm, splenic volume of 517 cc. IMPRESSION: 1. Increased bilateral renal parenchymal echogenicity consistent with chronic medical renal disease. No obstructive uropathy. 2. Simple cyst in the upper left kidney. 3. Incidental splenomegaly. Electronically Signed   By: Keith Rake M.D.   On: 11/09/2020 17:12   MR FOOT LEFT WO CONTRAST  Result Date: 11/10/2020 CLINICAL DATA:  Foot pain and swelling. EXAM: MRI OF THE LEFT FOOT WITHOUT CONTRAST TECHNIQUE: Multiplanar, multisequence MR imaging of the left foot was performed. No intravenous contrast was administered. COMPARISON:  None. FINDINGS: Bones/Joint/Cartilage Soft tissue wound along the plantar aspect of the forefoot overlying the second metatarsal. Bone destruction of the second metatarsal head with severe bone marrow edema throughout the second metatarsal. Bone destruction of the second proximal phalanx. Severe soft tissue edema around the second MTP joint. Bone marrow edema in the third metatarsal head. No acute fracture. Dorsal dislocation of the second proximal phalanx. 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 Ligaments Collateral ligaments are intact.  Lisfranc  ligament is intact. Muscles and Tendons Flexor, peroneal and extensor compartment tendons are intact. Generalized muscle atrophy. Soft tissue Severe soft tissue swelling around the second metatarsal neck concerning for a phlegmon/developing abscess measuring 2.1 x 1 cm circumferentially around the distal shaft. No soft tissue mass. Severe soft tissue edema of the forefoot consistent with cellulitis primarily along the dorsal aspect. IMPRESSION: 1. 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 2. Severe soft tissue swelling around the second metatarsal neck concerning for a phlegmon/developing abscess measuring 2.1 x 1 cm circumferentially around the distal shaft. 3. Mild early osteomyelitis of the third metatarsal head. 4. Severe soft tissue edema of the forefoot consistent with cellulitis primarily along the dorsal aspect. Electronically Signed   By: Kathreen Devoid M.D.   On: 11/10/2020 09:14   US Venous Img Lower Unilateral Left  Result Date: 11/09/2020 CLINICAL DATA:  pain swelling EXAM: LEFT LOWER EXTREMITY VENOUS DOPPLER ULTRASOUND TECHNIQUE: Gray-scale sonography with graded compression, as  well as color Doppler and duplex ultrasound were performed to evaluate the lower extremity deep venous systems from the level of the common femoral vein and including the common femoral, femoral, profunda femoral, popliteal and calf veins including the posterior tibial, peroneal and gastrocnemius veins when visible. The superficial great saphenous vein was also interrogated. Spectral Doppler was utilized to evaluate flow at rest and with distal augmentation maneuvers in the common femoral, femoral and popliteal veins. COMPARISON:  None. FINDINGS: Contralateral Common Femoral Vein: Respiratory phasicity is normal and symmetric with the symptomatic side. No evidence of thrombus. Normal compressibility. Common Femoral Vein: No evidence of thrombus.  Normal compressibility, respiratory phasicity and response to augmentation. Saphenofemoral Junction: No evidence of thrombus. Normal compressibility and flow on color Doppler imaging. Profunda Femoral Vein: No evidence of thrombus. Normal compressibility and flow on color Doppler imaging. Femoral Vein: No evidence of thrombus. Normal compressibility, respiratory phasicity and response to augmentation. Popliteal Vein: No evidence of thrombus. Normal compressibility, respiratory phasicity and response to augmentation. Calf Veins: No evidence of thrombus. Normal compressibility and flow on color Doppler imaging. Other Findings:  Calf edema. IMPRESSION: No evidence of deep venous thrombosis.  Calf edema is present. Electronically Signed   By: Albin Felling M.D.   On: 11/09/2020 14:44   DG Chest Portable 1 View  Result Date: 11/09/2020 CLINICAL DATA:  Altered mental status and fall EXAM: PORTABLE CHEST 1 VIEW COMPARISON:  07/26/2007 and other studies FINDINGS: The cardiomediastinal silhouette is unremarkable. There is no evidence of focal airspace disease, pulmonary edema, suspicious pulmonary nodule/mass, pleural effusion, or pneumothorax. No acute bony abnormalities are identified. IMPRESSION: No active disease. Electronically Signed   By: Margarette Canada M.D.   On: 11/09/2020 13:06   DG Foot Complete Left  Result Date: 11/09/2020 CLINICAL DATA:  Pain and swelling.  Ulcer at the bottom of the foot. EXAM: LEFT FOOT - COMPLETE 3+ VIEW COMPARISON:  None. FINDINGS: Soft tissue wound along the plantar aspect of the forefoot. Cortical irregularity of the third metatarsal head concerning for osteomyelitis. Irregularity of the second metatarsal head concerning for osteomyelitis. Mild periosteal reaction involving the second and third metatarsal shafts. Dorsal subluxation of the second and third proximal phalanx. Soft tissue emphysema in the second toe. Severe soft tissue swelling involving the foot and ankle.  IMPRESSION: Cortical irregularity of the third metatarsal head concerning for osteomyelitis. Irregularity of the second metatarsal head concerning for osteomyelitis. Mild periosteal reaction involving the second and third metatarsal shafts. Soft tissue swelling involving the foot and ankle which may be reactive versus secondary to cellulitis. Electronically Signed   By: Kathreen Devoid M.D.   On: 11/09/2020 13:45      Assessment/Plan:  Autumn [name] is a 70 y.o. 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  Patient verbalized understanding.  #Diabetes type 2 With CKD including proteinuria Outpatient patient patient was on lisinopril outpatient Currently on hold due to acute illness  # Hypokalemia Has improved with  oral replacement  Osteomyelitis, left foot Plan for partial ray amputation as per podiatry  #Anemia of CKD Last hemoglobin 7.2 We will add oral iron supplementation   Bhavesh Vazquez 11/10/20

## 2020-11-10 NOTE — Consult Note (Signed)
Pharmacy Antibiotic Note  Autumn [name] is a 70 y.o. female admitted on 11/09/2020 with  osteomyelitis .  Pharmacy has been consulted for CEFEPIME AND VANCOMYCIN dosing.  Plan: Random vancomycin level done after loading dose resulted in 8 mcg/ml. Noted renal function slightly improved after hydration with new Scr at 3.5 (baseline 3.2) with CKD stage 4.  Vancomycin 1000 mg IV Q 48 hrs. Goal AUC 400-550. Expected AUC: 532 SCr used: 3.5  Will resume order for Vancomycin 1000mg  q 48hrs with 1st dose tonight and order next level at steady state if vancomycin therapy continues.  Height: 5\' 6"  (167.6 cm) Weight: 86.2 kg (190 lb) IBW/kg (Calculated) : 59.3  Temp (24hrs), Avg:98.4 F (36.9 C), Min:98.4 F (36.9 C), Max:98.4 F (36.9 C)  Recent Labs  Lab 11/09/20 1220 11/09/20 1818 11/10/20 0649 11/10/20 1644  WBC 15.8*  --  13.5*  --   CREATININE 3.65*  --  3.50*  --   LATICACIDVEN 1.3 1.0  --   --   VANCORANDOM  --   --   --  8     Estimated Creatinine Clearance: 17 mL/min (A) (by C-G formula based on SCr of 3.5 mg/dL (H)).    Allergies  Allergen Reactions   Codeine Nausea And Vomiting    Antimicrobials this admission: 10/29 Vancomycin >>  10/29 Cefepime >>  10/29 Metronidazole>>  Dose adjustments this admission: Resume Vancomycin 1000mg  q 48 hrs regimen  Microbiology results: 10/29 UCx: ordered 10/29 MRSA PCR: ordered  Thank you for allowing pharmacy to be a part of this patient's care.  Cameo Shewell Rodriguez-Guzman PharmD, BCPS 11/10/2020 7:12 PM

## 2020-11-10 NOTE — Consult Note (Signed)
Reason for Consult: Infection  Referring Physician: Dr. Collier Bullock, MD  Autumn [name] is an 70 y.o. female.  HPI: Autumn [name] is a 70 y.o. 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  Patient states that she has been having ongoing issues with her left foot for about 3 years and she had been treated at [facility] and wound care center.  She was last seen on this on his 10th 2022 at [facility] for the same issue.  She reports increased pain, swelling or redness to her left foot recently.  She does get pain to her foot.  She states that she has had surgery previous and her toes in which portions of the bone, tendons have been removed.  Past Medical History:  Diagnosis Date   Diabetes mellitus without complication ([place])    Hypertension    Kidney stone    Thyroid disease     Past Surgical History:  Procedure Laterality Date   ABDOMINAL HYSTERECTOMY     URETERAL STENT PLACEMENT      Family History  Problem Relation Age of Onset   Hypertension Mother     Social History:  reports that she has quit smoking. She has never used smokeless tobacco. She reports that she does not drink alcohol and does not use drugs.  Allergies:  Allergies  Allergen Reactions   Codeine Nausea And Vomiting    Medications: I have reviewed the patient's current medications.  Results for orders placed or performed during the hospital encounter of 11/09/20 (from the past 48 hour(s))  Comprehensive metabolic panel     Status: Abnormal   Collection Time: 11/09/20 12:20 PM  Result Value Ref Range   Sodium 132 (L) 135 - 145 mmol/L   Potassium 3.2 (L) 3.5 - 5.1 mmol/L   Chloride 102 98 - 111 mmol/L    CO2 18 (L) 22 - 32 mmol/L   Glucose, Bld 164 (H) 70 - 99 mg/dL    Comment: Glucose reference range applies only to samples taken after fasting for at least 8 hours.   BUN 69 (H) 8 - 23 mg/dL   Creatinine, Ser 3.65 (H) 0.44 - 1.00 mg/dL   Calcium 8.4 (L) 8.9 - 10.3 mg/dL   Total Protein 6.4 (L) 6.5 - 8.1 g/dL   Albumin 2.7 (L) 3.5 - 5.0 g/dL   AST 35 15 - 41 U/L   ALT 25 0 - 44 U/L   Alkaline Phosphatase 84 38 - 126 U/L   Total Bilirubin 0.4 0.3 - 1.2 mg/dL   GFR, Estimated 13 (L) >60 mL/min    Comment: (NOTE) Calculated using the CKD-EPI Creatinine Equation (2021)    Anion gap 12 5 - 15    Comment: Performed at [facility], [address] [place], [place] [address]  Brain natriuretic peptide     Status: Abnormal   Collection Time: 11/09/20 12:20 PM  Result Value Ref Range   B Natriuretic Peptide 360.1 (H) 0.0 - 100.0 pg/mL    Comment: Performed at [facility], [address] [place], [place] [address]  Troponin I (High Sensitivity)     Status: Abnormal   Collection Time: 11/09/20 12:20 PM  Result Value Ref Range  Troponin I (High Sensitivity) 147 (HH) <18 ng/L    Comment: CRITICAL RESULT CALLED TO, READ BACK BY AND VERIFIED WITH JULIA SHEEHAN RN @1347  11/09/20 SCS (NOTE) Elevated high sensitivity troponin I (hsTnI) values and significant  changes across serial measurements may suggest ACS but many other  chronic and acute conditions are known to elevate hsTnI results.  Refer to the "Links" section for chest pain algorithms and additional  guidance. Performed at [facility], [address] [place], [place] [address]   Lactic acid, plasma     Status: None   Collection Time: 11/09/20 12:20 PM  Result Value Ref Range   Lactic Acid, Venous 1.3 0.5 - 1.9 mmol/L    Comment: Performed at [facility], [address] [place], [place] [address]  CBC with Differential     Status: Abnormal   Collection Time: 11/09/20 12:20 PM  Result  Value Ref Range   WBC 15.8 (H) 4.0 - 10.5 K/uL   RBC 2.84 (L) 3.87 - 5.11 MIL/uL   Hemoglobin 8.0 (L) 12.0 - 15.0 g/dL   HCT 24.5 (L) 36.0 - 46.0 %   MCV 86.3 80.0 - 100.0 fL   MCH 28.2 26.0 - 34.0 pg   MCHC 32.7 30.0 - 36.0 g/dL   RDW 14.5 11.5 - 15.5 %   Platelets 190 150 - 400 K/uL   nRBC 0.0 0.0 - 0.2 %   Neutrophils Relative % 85 %   Neutro Abs 13.5 (H) 1.7 - 7.7 K/uL   Lymphocytes Relative 5 %   Lymphs Abs 0.8 0.7 - 4.0 K/uL   Monocytes Relative 9 %   Monocytes Absolute 1.4 (H) 0.1 - 1.0 K/uL   Eosinophils Relative 0 %   Eosinophils Absolute 0.0 0.0 - 0.5 K/uL   Basophils Relative 0 %   Basophils Absolute 0.0 0.0 - 0.1 K/uL   Immature Granulocytes 1 %   Abs Immature Granulocytes 0.10 (H) 0.00 - 0.07 K/uL    Comment: Performed at [facility], [address] [place], [place] [address]  Sedimentation rate     Status: Abnormal   Collection Time: 11/09/20 12:20 PM  Result Value Ref Range   Sed Rate 127 (H) 0 - 30 mm/hr    Comment: Performed at [facility], [address] [place], [place] [address]  Lactic acid, plasma     Status: None   Collection Time: 11/09/20  6:18 PM  Result Value Ref Range   Lactic Acid, Venous 1.0 0.5 - 1.9 mmol/L    Comment: Performed at [facility], [address] [place], [place] [address]  Troponin I (High Sensitivity)     Status: Abnormal   Collection Time: 11/09/20  6:18 PM  Result Value Ref Range   Troponin I (High Sensitivity) 151 (HH) <18 ng/L    Comment: CRITICAL VALUE NOTED. VALUE IS CONSISTENT WITH PREVIOUSLY REPORTED/CALLED VALUE DLB (NOTE) Elevated high sensitivity troponin I (hsTnI) values and significant  changes across serial measurements may suggest ACS but many other  chronic and acute conditions are known to elevate hsTnI results.  Refer to the "Links" section for chest pain algorithms and additional  guidance. Performed at [facility], [address] [place], [place] [address]    CK     Status: None   Collection Time: 11/09/20  6:18 PM  Result Value Ref Range   Total CK 184 38 - 234 U/L    Comment: Performed at [facility], [address] [place], [place] [address]  TSH     Status: None   Collection Time: 11/09/20  6:18 PM  Result Value Ref Range   TSH 0.526 0.350 - 4.500 uIU/mL    Comment: Performed by a 3rd Generation assay with a functional sensitivity of <=0.01 uIU/mL. Performed at [facility], [address] [place], [place] [address]   Protime-INR     Status: Abnormal   Collection Time: 11/09/20  6:18 PM  Result Value Ref Range   Prothrombin Time 16.4 (H) 11.4 - 15.2 seconds   INR 1.3 (H) 0.8 - 1.2    Comment: (NOTE) INR goal varies based on device and disease states. Performed at [facility], [address] [place], [place] [address]   Magnesium     Status: None   Collection Time: 11/09/20  6:18 PM  Result Value Ref Range   Magnesium 2.0 1.7 - 2.4 mg/dL    Comment: Performed at [facility], [address] [place], [place] [address]  Hemoglobin A1c     Status: Abnormal   Collection Time: 11/09/20  6:25 PM  Result Value Ref Range   Hgb A1c MFr Bld 6.3 (H) 4.8 - 5.6 %    Comment: (NOTE) Pre diabetes:          5.7%-6.4%  Diabetes:              >6.4%  Glycemic control for   <7.0% adults with diabetes    Mean Plasma Glucose 134.11 mg/dL    Comment: Performed at [facility] [address] [place], [place] [address]  Sedimentation rate     Status: Abnormal   Collection Time: 11/09/20  6:25 PM  Result Value Ref Range   Sed Rate 126 (H) 0 - 30 mm/hr    Comment: Performed at [facility], [address] [place], [place] [address]  C-reactive protein     Status: Abnormal   Collection Time: 11/09/20  6:25 PM  Result Value Ref Range   CRP 23.5 (H) <1.0 mg/dL    Comment: Performed at [facility], [address] [place], [place] [address]  Prealbumin     Status: Abnormal    Collection Time: 11/09/20  6:25 PM  Result Value Ref Range   Prealbumin 7.8 (L) 18 - 38 mg/dL    Comment: Performed at [facility] [address], [place], [place] [address]  Iron and TIBC     Status: Abnormal   Collection Time: 11/09/20  6:25 PM  Result Value Ref Range   Iron 14 (L) 28 - 170 ug/dL   TIBC 232 (L) 250 - 450 ug/dL   Saturation Ratios 6 (L) 10.4 - 31.8 %   UIBC 218 ug/dL    Comment: Performed at [facility], [address] [place], [place] [address]  APTT     Status: Abnormal   Collection Time: 11/09/20  6:25 PM  Result Value Ref Range   aPTT 41 (H) 24 - 36 seconds    Comment:        IF BASELINE aPTT IS ELEVATED, SUGGEST PATIENT RISK ASSESSMENT BE USED TO DETERMINE APPROPRIATE ANTICOAGULANT THERAPY. Performed at [facility], [address] [place],  [address]   Resp Panel by RT-PCR (Flu A&B, Covid) Nasopharyngeal Swab     Status: None   Collection Time: 11/09/20  6:25 PM   Specimen: Nasopharyngeal Swab; Nasopharyngeal(NP) swabs in vial transport medium  Result Value Ref Range  SARS Coronavirus 2 by RT PCR NEGATIVE NEGATIVE    Comment: (NOTE) SARS-CoV-2 target nucleic acids are NOT DETECTED.  The SARS-CoV-2 RNA is generally detectable in upper respiratory specimens during the acute phase of infection. The lowest concentration of SARS-CoV-2 viral copies this assay can detect is 138 copies/mL. A negative result does not preclude SARS-Cov-2 infection and should not be used as the sole basis for treatment or other patient management decisions. A negative result may occur with  improper specimen collection/handling, submission of specimen other than nasopharyngeal swab, presence of viral mutation(s) within the areas targeted by this assay, and inadequate number of viral copies(<138 copies/mL). A negative result must be combined with clinical observations, patient history, and epidemiological information. The expected result is  Negative.  Fact Sheet for Patients:  EntrepreneurPulse.com.au  Fact Sheet for Healthcare Providers:  IncredibleEmployment.be  This test is no t yet approved or cleared by the [place] FDA and  has been authorized for detection and/or diagnosis of SARS-CoV-2 by FDA under an Emergency Use Authorization (EUA). This EUA will remain  in effect (meaning this test can be used) for the duration of the COVID-19 declaration under Section 564(b)(1) of the Act, 21 U.S.C.section 360bbb-3(b)(1), unless the authorization is terminated  or revoked sooner.       Influenza A by PCR NEGATIVE NEGATIVE   Influenza B by PCR NEGATIVE NEGATIVE    Comment: (NOTE) The Xpert Xpress SARS-CoV-2/FLU/RSV plus assay is intended as an aid in the diagnosis of influenza from Nasopharyngeal swab specimens and should not be used as a sole basis for treatment. Nasal washings and aspirates are unacceptable for Xpert Xpress SARS-CoV-2/FLU/RSV testing.  Fact Sheet for Patients: EntrepreneurPulse.com.au  Fact Sheet for Healthcare Providers: IncredibleEmployment.be  This test is not yet approved or cleared by the [place] FDA and has been authorized for detection and/or diagnosis of SARS-CoV-2 by FDA under an Emergency Use Authorization (EUA). This EUA will remain in effect (meaning this test can be used) for the duration of the COVID-19 declaration under Section 564(b)(1) of the Act, 21 U.S.C. section 360bbb-3(b)(1), unless the authorization is terminated or revoked.  Performed at [facility], [address] [place], [place] [address]   MRSA Next Gen by PCR, Nasal     Status: None   Collection Time: 11/10/20  1:14 AM   Specimen: Nasal Mucosa; Nasal Swab  Result Value Ref Range   MRSA by PCR Next Gen NOT DETECTED NOT DETECTED    Comment: (NOTE) The GeneXpert MRSA Assay (FDA approved for NASAL specimens only), is one  component of a comprehensive MRSA colonization surveillance program. It is not intended to diagnose MRSA infection nor to guide or monitor treatment for MRSA infections. Test performance is not FDA approved in patients less than 12 years old. Performed at [facility], [place], [place] [address]   Troponin I (High Sensitivity)     Status: Abnormal   Collection Time: 11/10/20  1:15 AM  Result Value Ref Range   Troponin I (High Sensitivity) 162 (HH) <18 ng/L    Comment: CRITICAL VALUE NOTED. VALUE IS CONSISTENT WITH PREVIOUSLY REPORTED/CALLED VALUE GAA (NOTE) Elevated high sensitivity troponin I (hsTnI) values and significant  changes across serial measurements may suggest ACS but many other  chronic and acute conditions are known to elevate hsTnI results.  Refer to the "Links" section for chest pain algorithms and additional  guidance. Performed at [facility], [address] [place], [place] [address]  Heparin level (unfractionated)     Status: Abnormal   Collection Time: 11/10/20  1:15 AM  Result Value Ref Range   Heparin Unfractionated 0.10 (L) 0.30 - 0.70 IU/mL    Comment: (NOTE) The clinical reportable range upper limit is being lowered to >1.10 to align with the FDA approved guidance for the current laboratory assay.  If heparin results are below expected values, and patient dosage has  been confirmed, suggest follow up testing of antithrombin III levels. Performed at [facility], [address] [place], [place] [address]   Urinalysis, Routine w reflex microscopic     Status: Abnormal   Collection Time: 11/10/20  1:20 AM  Result Value Ref Range   Color, Urine YELLOW (A) YELLOW   APPearance HAZY (A) CLEAR   Specific Gravity, Urine 1.013 1.005 - 1.030   pH 5.0 5.0 - 8.0   Glucose, UA NEGATIVE NEGATIVE mg/dL   Hgb urine dipstick NEGATIVE NEGATIVE   Bilirubin Urine NEGATIVE NEGATIVE   Ketones, ur NEGATIVE NEGATIVE mg/dL    Protein, ur 100 (A) NEGATIVE mg/dL   Nitrite NEGATIVE NEGATIVE   Leukocytes,Ua NEGATIVE NEGATIVE   RBC / HPF 0-5 0 - 5 RBC/hpf   WBC, UA 6-10 0 - 5 WBC/hpf   Bacteria, UA RARE (A) NONE SEEN   Squamous Epithelial / LPF 0-5 0 - 5    Comment: Performed at [facility], [place], [place] [address]  Troponin I (High Sensitivity)     Status: Abnormal   Collection Time: 11/10/20  6:49 AM  Result Value Ref Range   Troponin I (High Sensitivity) 149 (HH) <18 ng/L    Comment: CRITICAL VALUE NOTED. VALUE IS CONSISTENT WITH PREVIOUSLY REPORTED/CALLED VALUE MJU (NOTE) Elevated high sensitivity troponin I (hsTnI) values and significant  changes across serial measurements may suggest ACS but many other  chronic and acute conditions are known to elevate hsTnI results.  Refer to the "Links" section for chest pain algorithms and additional  guidance. Performed at [facility], [address] [place], [place] [address]   Basic metabolic panel     Status: Abnormal   Collection Time: 11/10/20  6:49 AM  Result Value Ref Range   Sodium 132 (L) 135 - 145 mmol/L   Potassium 3.5 3.5 - 5.1 mmol/L   Chloride 103 98 - 111 mmol/L   CO2 18 (L) 22 - 32 mmol/L   Glucose, Bld 152 (H) 70 - 99 mg/dL    Comment: Glucose reference range applies only to samples taken after fasting for at least 8 hours.   BUN 64 (H) 8 - 23 mg/dL   Creatinine, Ser 3.50 (H) 0.44 - 1.00 mg/dL   Calcium 8.2 (L) 8.9 - 10.3 mg/dL   GFR, Estimated 14 (L) >60 mL/min    Comment: (NOTE) Calculated using the CKD-EPI Creatinine Equation (2021)    Anion gap 11 5 - 15    Comment: Performed at [facility], [address] [place], [place] [address]  CBC     Status: Abnormal   Collection Time: 11/10/20  6:49 AM  Result Value Ref Range   WBC 13.5 (H) 4.0 - 10.5 K/uL   RBC 2.61 (L) 3.87 - 5.11 MIL/uL   Hemoglobin 7.2 (L) 12.0 - 15.0 g/dL   HCT 22.3 (L) 36.0 - 46.0 %   MCV 85.4 80.0 - 100.0 fL    MCH 27.6 26.0 - 34.0 pg   MCHC 32.3 30.0 - 36.0 g/dL  RDW 14.6 11.5 - 15.5 %   Platelets 180 150 - 400 K/uL   nRBC 0.0 0.0 - 0.2 %    Comment: Performed at [facility], [address] [place],  [address]    CT Head Wo Contrast  Result Date: 11/09/2020 CLINICAL DATA:  70 year old female with acute altered mental status. EXAM: CT HEAD WITHOUT CONTRAST TECHNIQUE: Contiguous axial images were obtained from the base of the skull through the vertex without intravenous contrast. COMPARISON:  None. FINDINGS: Brain: No evidence of acute infarction, hemorrhage, hydrocephalus, extra-axial collection or mass lesion/mass effect. Vascular: Carotid and vertebral atherosclerotic calcifications are noted. Skull: No acute abnormality Sinuses/Orbits: No acute abnormality Other: None IMPRESSION: No evidence of acute intracranial abnormality. Electronically Signed   By: Margarette Canada M.D.   On: 11/09/2020 13:05   US RENAL  Result Date: 11/09/2020 CLINICAL DATA:  Acute kidney injury. EXAM: RENAL / URINARY TRACT ULTRASOUND COMPLETE COMPARISON:  None. FINDINGS: Right Kidney: Renal measurements: 11.3 x 5.2 x 4.5 cm = volume: 138 mL. Increased parenchymal echogenicity. No hydronephrosis. No visualized cystic or solid lesion. No stone. Left Kidney: Renal measurements: 8.7 x 4.9 x 5.4 cm = volume: 120 mL. Increased parenchymal echogenicity. No hydronephrosis. There is a 1 cm cyst in the upper pole. No evidence of solid lesion or stone. Bladder: Appears normal for degree of bladder distention. Other: Incidental splenomegaly with spleen spanning 14.3 x 12.4 x 5.6 cm, splenic volume of 517 cc. IMPRESSION: 1. Increased bilateral renal parenchymal echogenicity consistent with chronic medical renal disease. No obstructive uropathy. 2. Simple cyst in the upper left kidney. 3. Incidental splenomegaly. Electronically Signed   By: Keith Rake M.D.   On: 11/09/2020 17:12   US Venous Img Lower Unilateral  Left  Result Date: 11/09/2020 CLINICAL DATA:  pain swelling EXAM: LEFT LOWER EXTREMITY VENOUS DOPPLER ULTRASOUND TECHNIQUE: Gray-scale sonography with graded compression, as well as color Doppler and duplex ultrasound were performed to evaluate the lower extremity deep venous systems from the level of the common femoral vein and including the common femoral, femoral, profunda femoral, popliteal and calf veins including the posterior tibial, peroneal and gastrocnemius veins when visible. The superficial great saphenous vein was also interrogated. Spectral Doppler was utilized to evaluate flow at rest and with distal augmentation maneuvers in the common femoral, femoral and popliteal veins. COMPARISON:  None. FINDINGS: Contralateral Common Femoral Vein: Respiratory phasicity is normal and symmetric with the symptomatic side. No evidence of thrombus. Normal compressibility. Common Femoral Vein: No evidence of thrombus. Normal compressibility, respiratory phasicity and response to augmentation. Saphenofemoral Junction: No evidence of thrombus. Normal compressibility and flow on color Doppler imaging. Profunda Femoral Vein: No evidence of thrombus. Normal compressibility and flow on color Doppler imaging. Femoral Vein: No evidence of thrombus. Normal compressibility, respiratory phasicity and response to augmentation. Popliteal Vein: No evidence of thrombus. Normal compressibility, respiratory phasicity and response to augmentation. Calf Veins: No evidence of thrombus. Normal compressibility and flow on color Doppler imaging. Other Findings:  Calf edema. IMPRESSION: No evidence of deep venous thrombosis.  Calf edema is present. Electronically Signed   By: Albin Felling M.D.   On: 11/09/2020 14:44   DG Chest Portable 1 View  Result Date: 11/09/2020 CLINICAL DATA:  Altered mental status and fall EXAM: PORTABLE CHEST 1 VIEW COMPARISON:  07/26/2007 and other studies FINDINGS: The cardiomediastinal silhouette is  unremarkable. There is no evidence of focal airspace disease, pulmonary edema, suspicious pulmonary nodule/mass, pleural effusion, or pneumothorax. No acute bony  abnormalities are identified. IMPRESSION: No active disease. Electronically Signed   By: Margarette Canada M.D.   On: 11/09/2020 13:06   DG Foot Complete Left  Result Date: 11/09/2020 CLINICAL DATA:  Pain and swelling.  Ulcer at the bottom of the foot. EXAM: LEFT FOOT - COMPLETE 3+ VIEW COMPARISON:  None. FINDINGS: Soft tissue wound along the plantar aspect of the forefoot. Cortical irregularity of the third metatarsal head concerning for osteomyelitis. Irregularity of the second metatarsal head concerning for osteomyelitis. Mild periosteal reaction involving the second and third metatarsal shafts. Dorsal subluxation of the second and third proximal phalanx. Soft tissue emphysema in the second toe. Severe soft tissue swelling involving the foot and ankle. IMPRESSION: Cortical irregularity of the third metatarsal head concerning for osteomyelitis. Irregularity of the second metatarsal head concerning for osteomyelitis. Mild periosteal reaction involving the second and third metatarsal shafts. Soft tissue swelling involving the foot and ankle which may be reactive versus secondary to cellulitis. Electronically Signed   By: Kathreen Devoid M.D.   On: 11/09/2020 13:45    Review of Systems Blood pressure 126/76, pulse 93, temperature 99.4 F (37.4 C), resp. rate 20, height 5\' 6"  (1.676 m), weight 86.2 kg, SpO2 94 %. Physical Exam General: NAD  Dermatological: Edema and erythema noted to the left foot extending proximal to the ankle.  Ulceration present submetatarsal 2 which is draining purulence.  There is probing of the wound.  There is tenderness on the dorsal aspect of the midfoot along the fourth metatarsal concerning for possible abscess as well.  There is no crepitation.       Vascular: On the right foot DP, PT pulses palpable however decreased  in the left side likely due to swelling.  Neruologic: Sensation decreased  Musculoskeletal: Mild tenderness palpation of the left foot.  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  MRI ordered and awaiting read on this.  I do think patient is going need to have surgical intervention and awaiting the MRI report.  I discussed options with the patient including at least partial ray amputations versus transmetatarsal amputation.  Continue IV antibiotics.  ABI ordered. Podiatry to continue to follow.   Trula Slade 11/10/2020, 9:09 AM

## 2020-11-10 NOTE — ED Notes (Signed)
Pt placed in hospital bed. Dressing applied to left foot.

## 2020-11-10 NOTE — Consult Note (Signed)
ANTICOAGULATION CONSULT NOTE - Initial Consult  Pharmacy Consult for Heparin gtt Indication: atrial fibrillation  Allergies  Allergen Reactions   Codeine Nausea And Vomiting    Patient Measurements: Height: 5\' 6"  (167.6 cm) Weight: 86.2 kg (190 lb) IBW/kg (Calculated) : 59.3 Heparin Dosing Weight: 77.8 kg  Vital Signs: BP: 130/81 (10/30 0100) Pulse Rate: 90 (10/30 0100)  Labs: Recent Labs    11/09/20 1220 11/09/20 1818 11/09/20 1825 11/10/20 0115  HGB 8.0*  --   --   --   HCT 24.5*  --   --   --   PLT 190  --   --   --   APTT  --   --  41*  --   LABPROT  --  16.4*  --   --   INR  --  1.3*  --   --   HEPARINUNFRC  --   --   --  0.10*  CREATININE 3.65*  --   --   --   CKTOTAL  --  184  --   --   TROPONINIHS 147* 151*  --  162*     Estimated Creatinine Clearance: 16.3 mL/min (A) (by C-G formula based on SCr of 3.65 mg/dL (H)).   Medical History: Past Medical History:  Diagnosis Date   Diabetes mellitus without complication ([place])    Hypertension    Kidney stone    Thyroid disease     Medications:  Scheduled:   vancomycin variable dose per unstable renal function (pharmacist dosing)   Does not apply See admin instructions    Assessment: Patient admitted with new onset A/Fib and worsening renal function. Antibiotics for osteomyelitis were initiated and pharmacy consulted for heparin drip management.  Noted history of DM, CKD-stage 4,HTN, thyroid disorder, and liver cirrhosis secondary to NASH.  Baseline INR , PLT 190 and PTT  10/30:  HL @ 0115 = 0.10, subtherapeutic   Goal of Therapy:  Heparin level 0.3-0.7 units/ml Monitor platelets by anticoagulation protocol: Yes   Plan:  10/30:  HL @ 0115 = 0.10, subtherapeutic  Will increase heparin drip to 1400 units/hr but no bolus per MD request.  Will recheck HL 8 hrs after rate change.   Solveig Fangman D 11/10/2020 2:53 AM

## 2020-11-10 NOTE — Progress Notes (Signed)
Initial Nutrition Assessment  DOCUMENTATION CODES:   Obesity unspecified  INTERVENTION:   -Ensure Max po daily, each supplement provides 150 kcal and 30 grams of protein.   -1 packet Juven BID, each packet provides 95 calories, 2.5 grams of protein (collagen), and 9.8 grams of carbohydrate (3 grams sugar); also contains 7 grams of L-arginine and L-glutamine, 300 mg vitamin C, 15 mg vitamin E, 1.2 mcg vitamin B-12, 9.5 mg zinc, 200 mg calcium, and 1.5 g  Calcium Beta-hydroxy-Beta-methylbutyrate to support wound healing  -MVI with minerals daily  NUTRITION DIAGNOSIS:   Increased nutrient needs related to wound healing as evidenced by estimated needs.  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 fib.  Pt admitted with sepsis secondary to lt foot osteomyelitis and likely abscess.  Reviewed I/O's: +500 ml x 24 hours  Pt unavailable at time of visit. Attempted to speak with pt via call to hospital room phone, however, unable to reach. RD unable to obtain further nutrition-related history or complete nutrition-focused physical exam at this time.     Per podiatry notes, pt will likely need surgical intervention (possible partial ray vs transmetatarsal amputation). X-rays concerning for osteomyelitis.   No meal completion data available to assess at this time.   Reviewed wt hx; noted history of distant wt loss.   Medications reviewed and include cardizem.   Lab Results  Component Value Date   HGBA1C 6.3 (H) 11/09/2020   PTA DM medications are none.   Labs reviewed: Na: 132 (inpatient orders for glycemic control are 0-6 units insulin aspart TID  with meals).    Diet Order:   Diet Order             Diet heart healthy/carb modified Room service appropriate? Yes; Fluid consistency: Thin  Diet effective now                   EDUCATION NEEDS:   No education needs have been identified at this time  Skin:  Skin Assessment: Skin Integrity Issues: Skin Integrity Issues:: Diabetic Ulcer Diabetic Ulcer: lt foot  Last BM:  Unknown  Height:   Ht Readings from Last 1 Encounters:  11/09/20 5\' 6"  (1.676 m)    Weight:   Wt Readings from Last 1 Encounters:  11/09/20 86.2 kg    Ideal Body Weight:  59.1 kg  BMI:  Body mass index is 30.67 kg/m.  Estimated Nutritional Needs:   Kcal:  2585-2778  Protein:  105-120 grams  Fluid:  > 1.7 L    Loistine Chance, RD, LDN, [place] Registered Dietitian II Certified Diabetes Care and Education Specialist Please refer to [facility] for RD and/or RD on-call/weekend/after hours pager

## 2020-11-10 NOTE — Consult Note (Addendum)
[place] Nurse Consult Note: Reason for [place] is simultaneously consulted with Podiatric Medicine.  Patient has been seen by Podiatry at [facility] for this wound, but not since 08/21/20 (Dr. Janus Molder).  Dr. Jacqualyn Posey has responded to consult request and will see patient today. WOC will defer to his evaluation and POC. Wound type: Neuropathic, infectious Pressure Injury POA: N/A  ICD-10 CM Codes for Irritant Dermatitis  L30.4  - Erythema intertrigo. Also used for abrasion of the hand, chafing of the skin, dermatitis due to sweating and friction, friction dermatitis, friction eczema, and genital/thigh intertrigo.   ED Provider also noted a fungal rash beneath the right breast.  I will implement a POC for that area using our house antimicrobial wicking textile, InterDry. [place] nursing team will not follow, but will remain available to this patient, the nursing and medical teams.  Please re-consult if needed. Thanks, Maudie Flakes, MSN, RN, [place], Arther Abbott  Pager# [phone]

## 2020-11-11 ENCOUNTER — Inpatient Hospital Stay: Payer: Medicare (Managed Care) | Admitting: Anesthesiology

## 2020-11-11 ENCOUNTER — Encounter
Admission: EM | Disposition: A | Discharge status: Skilled Nursing Facility | Payer: Self-pay | Source: Home / Self Care | Attending: Internal Medicine

## 2020-11-11 ENCOUNTER — Encounter: Payer: Self-pay | Admitting: Internal Medicine

## 2020-11-11 DIAGNOSIS — Z862 Personal history of diseases of the blood and blood-forming organs and certain disorders involving the immune mechanism: Secondary | ICD-10-CM

## 2020-11-11 DIAGNOSIS — N189 Chronic kidney disease, unspecified: Secondary | ICD-10-CM | POA: Diagnosis not present

## 2020-11-11 DIAGNOSIS — E1122 Type 2 diabetes mellitus with diabetic chronic kidney disease: Secondary | ICD-10-CM | POA: Diagnosis not present

## 2020-11-11 DIAGNOSIS — R778 Other specified abnormalities of plasma proteins: Secondary | ICD-10-CM | POA: Diagnosis not present

## 2020-11-11 DIAGNOSIS — A419 Sepsis, unspecified organism: Secondary | ICD-10-CM | POA: Diagnosis not present

## 2020-11-11 DIAGNOSIS — I4891 Unspecified atrial fibrillation: Secondary | ICD-10-CM | POA: Diagnosis not present

## 2020-11-11 DIAGNOSIS — M86172 Other acute osteomyelitis, left ankle and foot: Secondary | ICD-10-CM

## 2020-11-11 HISTORY — PX: AMPUTATION: SHX166

## 2020-11-11 LAB — CBC
HCT: 22.6 % — ABNORMAL LOW (ref 36.0–46.0)
Hemoglobin: 7.4 g/dL — ABNORMAL LOW (ref 12.0–15.0)
MCH: 28.2 pg (ref 26.0–34.0)
MCHC: 32.7 g/dL (ref 30.0–36.0)
MCV: 86.3 fL (ref 80.0–100.0)
Platelets: 185 10*3/uL (ref 150–400)
RBC: 2.62 MIL/uL — ABNORMAL LOW (ref 3.87–5.11)
RDW: 14.7 % (ref 11.5–15.5)
WBC: 15.6 10*3/uL — ABNORMAL HIGH (ref 4.0–10.5)
nRBC: 0 % (ref 0.0–0.2)

## 2020-11-11 LAB — BASIC METABOLIC PANEL
Anion gap: 10 (ref 5–15)
BUN: 72 mg/dL — ABNORMAL HIGH (ref 8–23)
CO2: 18 mmol/L — ABNORMAL LOW (ref 22–32)
Calcium: 8.6 mg/dL — ABNORMAL LOW (ref 8.9–10.3)
Chloride: 104 mmol/L (ref 98–111)
Creatinine, Ser: 3.65 mg/dL — ABNORMAL HIGH (ref 0.44–1.00)
GFR, Estimated: 13 mL/min — ABNORMAL LOW (ref >60)
Glucose, Bld: 133 mg/dL — ABNORMAL HIGH (ref 70–99)
Potassium: 4 mmol/L (ref 3.5–5.1)
Sodium: 132 mmol/L — ABNORMAL LOW (ref 135–145)

## 2020-11-11 LAB — GLUCOSE, CAPILLARY
Glucose-Capillary: 132 mg/dL — ABNORMAL HIGH (ref 70–99)
Glucose-Capillary: 145 mg/dL — ABNORMAL HIGH (ref 70–99)
Glucose-Capillary: 121 mg/dL — ABNORMAL HIGH (ref 70–99)
Glucose-Capillary: 145 mg/dL — ABNORMAL HIGH (ref 70–99)

## 2020-11-11 LAB — VANCOMYCIN, RANDOM: Vancomycin Rm: 12

## 2020-11-11 LAB — HEPARIN LEVEL (UNFRACTIONATED): Heparin Unfractionated: 0.43 IU/mL (ref 0.30–0.70)

## 2020-11-11 SURGERY — AMPUTATION, FOOT, RAY
Anesthesia: General | Site: Foot | Laterality: Left

## 2020-11-11 MED ORDER — 0.9 % SODIUM CHLORIDE (POUR BTL) OPTIME
TOPICAL | Status: DC | PRN
  Administered 2020-11-11: 500 mL

## 2020-11-11 MED ORDER — SODIUM CHLORIDE 0.9 % IR SOLN
Status: DC | PRN
  Administered 2020-11-11: 3000 mL

## 2020-11-11 MED ORDER — FENTANYL CITRATE (PF) 100 MCG/2ML IJ SOLN: 25.0000 ug | INTRAMUSCULAR | Status: DC | PRN

## 2020-11-11 MED ORDER — HEPARIN (PORCINE) 25000 UT/250ML-% IV SOLN: 1800.0000 [IU]/h | INTRAVENOUS | Status: DC

## 2020-11-11 MED ORDER — LIDOCAINE HCL (PF) 1 % IJ SOLN
INTRAMUSCULAR | Status: AC
  Filled 2020-11-11: qty 30

## 2020-11-11 MED ORDER — FENTANYL CITRATE (PF) 100 MCG/2ML IJ SOLN
INTRAMUSCULAR | Status: AC
  Filled 2020-11-11: qty 2

## 2020-11-11 MED ORDER — PROPOFOL 10 MG/ML IV BOLUS
INTRAVENOUS | Status: AC
  Filled 2020-11-11: qty 20

## 2020-11-11 MED ORDER — FENTANYL CITRATE (PF) 100 MCG/2ML IJ SOLN
INTRAMUSCULAR | Status: DC | PRN
  Administered 2020-11-11: 50 ug via INTRAVENOUS

## 2020-11-11 MED ORDER — ACETAMINOPHEN 10 MG/ML IV SOLN
INTRAVENOUS | Status: AC
  Filled 2020-11-11: qty 100

## 2020-11-11 MED ORDER — PROPOFOL 10 MG/ML IV BOLUS
INTRAVENOUS | Status: DC | PRN
  Administered 2020-11-11 (×2): 10 mg via INTRAVENOUS
  Administered 2020-11-11: 40 mg via INTRAVENOUS
  Administered 2020-11-11: 10 mg via INTRAVENOUS

## 2020-11-11 MED ORDER — KETAMINE HCL 10 MG/ML IJ SOLN
INTRAMUSCULAR | Status: DC | PRN
  Administered 2020-11-11: 10 mg via INTRAVENOUS

## 2020-11-11 MED ORDER — BUPIVACAINE HCL (PF) 0.5 % IJ SOLN
INTRAMUSCULAR | Status: AC
  Filled 2020-11-11: qty 30

## 2020-11-11 MED ORDER — PROPOFOL 500 MG/50ML IV EMUL
INTRAVENOUS | Status: AC
  Filled 2020-11-11: qty 50

## 2020-11-11 MED ORDER — KETAMINE HCL 50 MG/5ML IJ SOSY
PREFILLED_SYRINGE | INTRAMUSCULAR | Status: AC
  Filled 2020-11-11: qty 5

## 2020-11-11 MED ORDER — VANCOMYCIN HCL 1000 MG IV SOLR
INTRAVENOUS | Status: AC
  Filled 2020-11-11: qty 20

## 2020-11-11 MED ORDER — PHENYLEPHRINE HCL (PRESSORS) 10 MG/ML IV SOLN
INTRAVENOUS | Status: AC
  Filled 2020-11-11: qty 1

## 2020-11-11 MED ORDER — PROPOFOL 500 MG/50ML IV EMUL
INTRAVENOUS | Status: DC | PRN
  Administered 2020-11-11: 20 ug/kg/min via INTRAVENOUS

## 2020-11-11 MED ORDER — HEPARIN (PORCINE) 25000 UT/250ML-% IV SOLN
1800.0000 [IU]/h | INTRAVENOUS | Status: DC
  Administered 2020-11-12 – 2020-11-13 (×2): 1800 [IU]/h via INTRAVENOUS
  Filled 2020-11-11 (×2): qty 250

## 2020-11-11 MED ORDER — ACETAMINOPHEN 10 MG/ML IV SOLN
INTRAVENOUS | Status: DC | PRN
  Administered 2020-11-11: 1000 mg via INTRAVENOUS

## 2020-11-11 MED ORDER — VANCOMYCIN HCL 1000 MG IV SOLR
INTRAVENOUS | Status: DC | PRN
  Administered 2020-11-11: 1000 mg via TOPICAL

## 2020-11-11 SURGICAL SUPPLY — 43 items
BLADE SURG 10 STRL SS SAFETY (BLADE) IMPLANT
BNDG COHESIVE 4X5 TAN ST LF (GAUZE/BANDAGES/DRESSINGS) ×2 IMPLANT
BNDG ELASTIC 4X5.8 VLCR NS LF (GAUZE/BANDAGES/DRESSINGS) ×4 IMPLANT
BNDG ESMARK 4X12 TAN STRL LF (GAUZE/BANDAGES/DRESSINGS) ×2 IMPLANT
BNDG GAUZE ELAST 4 BULKY (GAUZE/BANDAGES/DRESSINGS) ×2 IMPLANT
BNDG STRETCH 4X75 STRL LF (GAUZE/BANDAGES/DRESSINGS) ×2 IMPLANT
CUFF TOURN SGL QUICK 18X4 (TOURNIQUET CUFF) ×2 IMPLANT
CUFF TOURN SGL QUICK 24 (TOURNIQUET CUFF)
CUFF TRNQT CYL 24X4X16.5-23 (TOURNIQUET CUFF) IMPLANT
DRAIN PENROSE 12X.25 LTX STRL (MISCELLANEOUS) IMPLANT
DRAPE FLUOR MINI C-ARM 54X84 (DRAPES) IMPLANT
DRSG EMULSION OIL 3X8 NADH (GAUZE/BANDAGES/DRESSINGS) IMPLANT
DURAPREP 26ML APPLICATOR (WOUND CARE) ×2 IMPLANT
ELECT REM PT RETURN 9FT ADLT (ELECTROSURGICAL) ×2
ELECTRODE REM PT RTRN 9FT ADLT (ELECTROSURGICAL) ×1 IMPLANT
GAUZE 4X4 16PLY ~~LOC~~+RFID DBL (SPONGE) ×2 IMPLANT
GAUZE PACKING IODOFORM 1/2 (PACKING) ×2 IMPLANT
GAUZE SPONGE 4X4 12PLY STRL (GAUZE/BANDAGES/DRESSINGS) ×4 IMPLANT
GLOVE SRG 8 PF TXTR STRL LF DI (GLOVE) ×1 IMPLANT
GLOVE SURG ENC TEXT LTX SZ8 (GLOVE) ×2 IMPLANT
GLOVE SURG UNDER POLY LF SZ8 (GLOVE) ×1
GOWN STRL REUS W/ TWL XL LVL3 (GOWN DISPOSABLE) ×1 IMPLANT
GOWN STRL REUS W/TWL XL LVL3 (GOWN DISPOSABLE) ×1
HANDLE YANKAUER SUCT BULB TIP (MISCELLANEOUS) ×2 IMPLANT
KIT TURNOVER KIT A (KITS) ×2 IMPLANT
LABEL OR SOLS (LABEL) IMPLANT
MANIFOLD NEPTUNE II (INSTRUMENTS) ×2 IMPLANT
NDL SAFETY ECLIPSE 18X1.5 (NEEDLE) ×1 IMPLANT
NEEDLE FILTER BLUNT 18X 1/2SAF (NEEDLE) ×1
NEEDLE FILTER BLUNT 18X1 1/2 (NEEDLE) ×1 IMPLANT
NEEDLE HYPO 18GX1.5 SHARP (NEEDLE) ×1
NS IRRIG 500ML POUR BTL (IV SOLUTION) ×2 IMPLANT
PACK EXTREMITY ARMC (MISCELLANEOUS) ×2 IMPLANT
PAD ABD DERMACEA PRESS 5X9 (GAUZE/BANDAGES/DRESSINGS) ×4 IMPLANT
SOL PREP PVP 2OZ (MISCELLANEOUS)
SOLUTION PREP PVP 2OZ (MISCELLANEOUS) IMPLANT
SPONGE T-LAP 18X18 ~~LOC~~+RFID (SPONGE) ×2 IMPLANT
STAPLER SKIN PROX 35W (STAPLE) IMPLANT
STOCKINETTE M/LG 89821 (MISCELLANEOUS) ×2 IMPLANT
SUT PROLENE 3 0 PS 2 (SUTURE) IMPLANT
SWAB CULTURE AMIES ANAERIB BLU (MISCELLANEOUS) ×2 IMPLANT
SYR 10ML LL (SYRINGE) ×4 IMPLANT
WATER STERILE IRR 500ML POUR (IV SOLUTION) IMPLANT

## 2020-11-11 NOTE — Interval H&P Note (Signed)
History and Physical Interval Note:  11/11/2020 2:17 PM  Autumn [name]  has presented today for surgery, with the diagnosis of Osteomyelitis.  The various methods of treatment have been discussed with the patient and family. After consideration of risks, benefits and other options for treatment, the patient has consented to  Procedure(s): AMPUTATION RAY - 2nd and possible 3rd (Left) as a surgical intervention.  [name] history has been reviewed, patient examined, no change in status, stable for surgery.  I have reviewed the patient's chart and labs.  Questions were answered to the patient's satisfaction.     [name]

## 2020-11-11 NOTE — Anesthesia Preprocedure Evaluation (Addendum)
Anesthesia Evaluation  Patient identified by MRN, date of birth, ID band Patient awake    Reviewed: Allergy & Precautions, NPO status , Patient's Chart, lab work & pertinent test results  History of Anesthesia Complications Negative for: history of anesthetic complications  Airway Mallampati: III       Dental  (+) Poor Dentition, Chipped, Missing, Dental Advidsory Given   Pulmonary neg pulmonary ROS, former smoker,           Cardiovascular Exercise Tolerance: Poor hypertension, (-) angina+CHF (chronic diastolic heart failure)  (-) Past MI + dysrhythmias Atrial Fibrillation (-) Valvular Problems/Murmurs  10/22: ECHO :  1. Left ventricular ejection fraction, by estimation, is 55 to 60%. The  left ventricle has normal function. The left ventricle has no regional  wall motion abnormalities. There is mild concentric left ventricular  hypertrophy. Left ventricular diastolic  parameters are indeterminate. Elevated left ventricular end-diastolic  pressure.  2. Right ventricular systolic function is normal. The right ventricular  size is normal. There is normal pulmonary artery systolic pressure.  3. Left atrial size was severely dilated.  4. The mitral valve is normal in structure. Trivial mitral valve  regurgitation. No evidence of mitral stenosis.  5. The aortic valve is tricuspid. Aortic valve regurgitation is not  visualized. No aortic stenosis is present.  6. The inferior vena cava is normal in size with greater than 50%  respiratory variability, suggesting right atrial pressure of 3 mmHg.  7. There is a small patent foramen ovale.   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  Pt with new onset A/Fib and worsening renal function. She was started on a Cardizem drip and transitioned to oral regimen. She is on antibiotics for osteomyelitis and sepsis. She has been placed on a heparin drip for prevention of atrial thrombus. Cardiology evaluated patient and said, "it is Okay to stop IV heparin for surgery.  Once she is stable postoperatively, would recommend TEE/DCCV prior to discharge if she does not spontaneously convert."  Noted history of DM, CKD-stage 4,HTN, thyroid disorder, and liver cirrhosis secondary to NASH.  Hypokalemia replaced Elevated troponin likely demand ischemia from sepsis  Reproductive/Obstetrics negative OB ROS                           Anesthesia Physical Anesthesia Plan  ASA: 4  Anesthesia Plan: General   Post-op Pain Management:    Induction: Intravenous  PONV Risk Score and Plan: 3 and TIVA and Propofol infusion  Airway Management Planned: Natural Airway and Simple Face Mask  Additional Equipment:   Intra-op Plan:   Post-operative Plan:   Informed Consent:   Plan Discussed with:   Anesthesia Plan Comments:        Anesthesia Quick Evaluation

## 2020-11-11 NOTE — Progress Notes (Signed)
[facility]  ROUNDING NOTE   Subjective:   Patient seen resting in bed Alert and oriented Currently NPO for procedure Denies shortness of breath, on O2 3L supplemental oxygen Remains on IVF- LR and Heparin drip  Objective:  Vital signs in last 24 hours:  Temp:  [98 F (36.7 C)-99.1 F (37.3 C)] 98 F (36.7 C) (10/31 0903) Pulse Rate:  [74-109] 104 (10/31 0903) Resp:  [17-23] 18 (10/31 0903) BP: (111-146)/(66-98) 133/83 (10/31 0903) SpO2:  [91 %-98 %] 91 % (10/31 0903)  Weight change:  Filed Weights   11/09/20 1214  Weight: 86.2 kg    Intake/Output: I/O last 3 completed shifts: In: 1323.1 [I.V.:823.1; IV Piggyback:500] Out: -    Intake/Output this shift:  No intake/output data recorded.  Physical Exam: General: NAD, resting in bed  Head: Normocephalic, atraumatic. Moist oral mucosal membranes  Eyes: Anicteric  Lungs:  Clear to auscultation, normal effort, [place] O2  Heart: Irregular rhythm  Abdomen:  Soft, nontender  Extremities:  no peripheral edema.  Neurologic: Alert, moving all four extremities  Skin: No lesions or rashes       Basic Metabolic Panel: Recent Labs  Lab 11/09/20 1220 11/09/20 1818 11/10/20 0649 11/11/20 0636  NA 132*  --  132* 132*  K 3.2*  --  3.5 4.0  CL 102  --  103 104  CO2 18*  --  18* 18*  GLUCOSE 164*  --  152* 133*  BUN 69*  --  64* 72*  CREATININE 3.65*  --  3.50* 3.65*  CALCIUM 8.4*  --  8.2* 8.6*  MG  --  2.0  --   --     Liver Function Tests: Recent Labs  Lab 11/09/20 1220  AST 35  ALT 25  ALKPHOS 84  BILITOT 0.4  PROT 6.4*  ALBUMIN 2.7*   No results for input(s): LIPASE, AMYLASE in the last 168 hours. No results for input(s): AMMONIA in the last 168 hours.  CBC: Recent Labs  Lab 11/09/20 1220 11/10/20 0649 11/11/20 0636  WBC 15.8* 13.5* 15.6*  NEUTROABS 13.5*  --   --   HGB 8.0* 7.2* 7.4*  HCT 24.5* 22.3* 22.6*  MCV 86.3 85.4 86.3  PLT 190 180 185    Cardiac Enzymes: Recent Labs   Lab 11/09/20 1818  CKTOTAL 184    BNP: Invalid input(s): POCBNP  CBG: Recent Labs  Lab 11/10/20 1704 11/10/20 2125 11/11/20 0813  GLUCAP 153* 124* 132*    Microbiology: Results for orders placed or performed during the hospital encounter of 11/09/20  Resp Panel by RT-PCR (Flu A&B, Covid) Nasopharyngeal Swab     Status: None   Collection Time: 11/09/20  6:25 PM   Specimen: Nasopharyngeal Swab; Nasopharyngeal(NP) swabs in vial transport medium  Result Value Ref Range Status   SARS Coronavirus 2 by RT PCR NEGATIVE NEGATIVE Final    Comment: (NOTE) SARS-CoV-2 target nucleic acids are NOT DETECTED.  The SARS-CoV-2 RNA is generally detectable in upper respiratory specimens during the acute phase of infection. The lowest concentration of SARS-CoV-2 viral copies this assay can detect is 138 copies/mL. A negative result does not preclude SARS-Cov-2 infection and should not be used as the sole basis for treatment or other patient management decisions. A negative result may occur with  improper specimen collection/handling, submission of specimen other than nasopharyngeal swab, presence of viral mutation(s) within the areas targeted by this assay, and inadequate number of viral copies(<138 copies/mL). A negative result must be combined  with clinical observations, patient history, and epidemiological information. The expected result is Negative.  Fact Sheet for Patients:  EntrepreneurPulse.com.au  Fact Sheet for Healthcare Providers:  IncredibleEmployment.be  This test is no t yet approved or cleared by the [place] FDA and  has been authorized for detection and/or diagnosis of SARS-CoV-2 by FDA under an Emergency Use Authorization (EUA). This EUA will remain  in effect (meaning this test can be used) for the duration of the COVID-19 declaration under Section 564(b)(1) of the Act, 21 U.S.C.section 360bbb-3(b)(1), unless the  authorization is terminated  or revoked sooner.       Influenza A by PCR NEGATIVE NEGATIVE Final   Influenza B by PCR NEGATIVE NEGATIVE Final    Comment: (NOTE) The Xpert Xpress SARS-CoV-2/FLU/RSV plus assay is intended as an aid in the diagnosis of influenza from Nasopharyngeal swab specimens and should not be used as a sole basis for treatment. Nasal washings and aspirates are unacceptable for Xpert Xpress SARS-CoV-2/FLU/RSV testing.  Fact Sheet for Patients: EntrepreneurPulse.com.au  Fact Sheet for Healthcare Providers: IncredibleEmployment.be  This test is not yet approved or cleared by the [place] FDA and has been authorized for detection and/or diagnosis of SARS-CoV-2 by FDA under an Emergency Use Authorization (EUA). This EUA will remain in effect (meaning this test can be used) for the duration of the COVID-19 declaration under Section 564(b)(1) of the Act, 21 U.S.C. section 360bbb-3(b)(1), unless the authorization is terminated or revoked.  Performed at [facility], [address] [place], [place] [address]   MRSA Next Gen by PCR, Nasal     Status: None   Collection Time: 11/10/20  1:14 AM   Specimen: Nasal Mucosa; Nasal Swab  Result Value Ref Range Status   MRSA by PCR Next Gen NOT DETECTED NOT DETECTED Final    Comment: (NOTE) The GeneXpert MRSA Assay (FDA approved for NASAL specimens only), is one component of a comprehensive MRSA colonization surveillance program. It is not intended to diagnose MRSA infection nor to guide or monitor treatment for MRSA infections. Test performance is not FDA approved in patients less than 41 years old. Performed at [facility], [address] [place], [place] [address]     Coagulation Studies: Recent Labs    11/09/20 1818  LABPROT 16.4*  INR 1.3*    Urinalysis: Recent Labs    11/10/20 0120  COLORURINE YELLOW*  LABSPEC 1.013  PHURINE 5.0  GLUCOSEU  NEGATIVE  HGBUR NEGATIVE  BILIRUBINUR NEGATIVE  KETONESUR NEGATIVE  PROTEINUR 100*  NITRITE NEGATIVE  LEUKOCYTESUR NEGATIVE      Imaging: CT Head Wo Contrast  Result Date: 11/09/2020 CLINICAL DATA:  70 year old female with acute altered mental status. EXAM: CT HEAD WITHOUT CONTRAST TECHNIQUE: Contiguous axial images were obtained from the base of the skull through the vertex without intravenous contrast. COMPARISON:  None. FINDINGS: Brain: No evidence of acute infarction, hemorrhage, hydrocephalus, extra-axial collection or mass lesion/mass effect. Vascular: Carotid and vertebral atherosclerotic calcifications are noted. Skull: No acute abnormality Sinuses/Orbits: No acute abnormality Other: None IMPRESSION: No evidence of acute intracranial abnormality. Electronically Signed   By: Margarette Canada M.D.   On: 11/09/2020 13:05   US RENAL  Result Date: 11/09/2020 CLINICAL DATA:  Acute kidney injury. EXAM: RENAL / URINARY TRACT ULTRASOUND COMPLETE COMPARISON:  None. FINDINGS: Right Kidney: Renal measurements: 11.3 x 5.2 x 4.5 cm = volume: 138 mL. Increased parenchymal echogenicity. No hydronephrosis. No visualized cystic or solid lesion. No stone. Left Kidney: Renal measurements: 8.7  x 4.9 x 5.4 cm = volume: 120 mL. Increased parenchymal echogenicity. No hydronephrosis. There is a 1 cm cyst in the upper pole. No evidence of solid lesion or stone. Bladder: Appears normal for degree of bladder distention. Other: Incidental splenomegaly with spleen spanning 14.3 x 12.4 x 5.6 cm, splenic volume of 517 cc. IMPRESSION: 1. Increased bilateral renal parenchymal echogenicity consistent with chronic medical renal disease. No obstructive uropathy. 2. Simple cyst in the upper left kidney. 3. Incidental splenomegaly. Electronically Signed   By: Keith Rake M.D.   On: 11/09/2020 17:12   MR FOOT LEFT WO CONTRAST  Result Date: 11/10/2020 CLINICAL DATA:  Foot pain and swelling. EXAM: MRI OF THE LEFT FOOT WITHOUT  CONTRAST TECHNIQUE: Multiplanar, multisequence MR imaging of the left foot was performed. No intravenous contrast was administered. COMPARISON:  None. FINDINGS: Bones/Joint/Cartilage Soft tissue wound along the plantar aspect of the forefoot overlying the second metatarsal. Bone destruction of the second metatarsal head with severe bone marrow edema throughout the second metatarsal. Bone destruction of the second proximal phalanx. Severe soft tissue edema around the second MTP joint. Bone marrow edema in the third metatarsal head. No acute fracture. Dorsal dislocation of the second proximal phalanx. 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 Ligaments Collateral ligaments are intact.  Lisfranc ligament is intact. Muscles and Tendons Flexor, peroneal and extensor compartment tendons are intact. Generalized muscle atrophy. Soft tissue Severe soft tissue swelling around the second metatarsal neck concerning for a phlegmon/developing abscess measuring 2.1 x 1 cm circumferentially around the distal shaft. No soft tissue mass. Severe soft tissue edema of the forefoot consistent with cellulitis primarily along the dorsal aspect. IMPRESSION: 1. 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 2. Severe soft tissue swelling around the second metatarsal neck concerning for a phlegmon/developing abscess measuring 2.1 x 1 cm circumferentially around the distal shaft. 3. Mild early osteomyelitis of the third metatarsal head. 4. Severe soft tissue edema of the forefoot consistent with cellulitis primarily along the dorsal aspect. Electronically Signed   By: Kathreen Devoid M.D.   On: 11/10/2020 09:14   US Venous Img Lower Unilateral Left  Result Date: 11/09/2020 CLINICAL DATA:  pain swelling EXAM: LEFT LOWER EXTREMITY VENOUS DOPPLER ULTRASOUND TECHNIQUE: Gray-scale sonography with graded compression, as well as color Doppler and  duplex ultrasound were performed to evaluate the lower extremity deep venous systems from the level of the common femoral vein and including the common femoral, femoral, profunda femoral, popliteal and calf veins including the posterior tibial, peroneal and gastrocnemius veins when visible. The superficial great saphenous vein was also interrogated. Spectral Doppler was utilized to evaluate flow at rest and with distal augmentation maneuvers in the common femoral, femoral and popliteal veins. COMPARISON:  None. FINDINGS: Contralateral Common Femoral Vein: Respiratory phasicity is normal and symmetric with the symptomatic side. No evidence of thrombus. Normal compressibility. Common Femoral Vein: No evidence of thrombus. Normal compressibility, respiratory phasicity and response to augmentation. Saphenofemoral Junction: No evidence of thrombus. Normal compressibility and flow on color Doppler imaging. Profunda Femoral Vein: No evidence of thrombus. Normal compressibility and flow on color Doppler imaging. Femoral Vein: No evidence of thrombus. Normal compressibility, respiratory phasicity and response to augmentation. Popliteal Vein: No evidence of thrombus. Normal compressibility, respiratory phasicity and response to augmentation. Calf Veins: No evidence of thrombus. Normal compressibility and flow on color Doppler imaging. Other Findings:  Calf edema. IMPRESSION: No evidence of deep  venous thrombosis.  Calf edema is present. Electronically Signed   By: Albin Felling M.D.   On: 11/09/2020 14:44   DG Chest Portable 1 View  Result Date: 11/09/2020 CLINICAL DATA:  Altered mental status and fall EXAM: PORTABLE CHEST 1 VIEW COMPARISON:  07/26/2007 and other studies FINDINGS: The cardiomediastinal silhouette is unremarkable. There is no evidence of focal airspace disease, pulmonary edema, suspicious pulmonary nodule/mass, pleural effusion, or pneumothorax. No acute bony abnormalities are identified. IMPRESSION: No  active disease. Electronically Signed   By: Margarette Canada M.D.   On: 11/09/2020 13:06   DG Foot Complete Left  Result Date: 11/09/2020 CLINICAL DATA:  Pain and swelling.  Ulcer at the bottom of the foot. EXAM: LEFT FOOT - COMPLETE 3+ VIEW COMPARISON:  None. FINDINGS: Soft tissue wound along the plantar aspect of the forefoot. Cortical irregularity of the third metatarsal head concerning for osteomyelitis. Irregularity of the second metatarsal head concerning for osteomyelitis. Mild periosteal reaction involving the second and third metatarsal shafts. Dorsal subluxation of the second and third proximal phalanx. Soft tissue emphysema in the second toe. Severe soft tissue swelling involving the foot and ankle. IMPRESSION: Cortical irregularity of the third metatarsal head concerning for osteomyelitis. Irregularity of the second metatarsal head concerning for osteomyelitis. Mild periosteal reaction involving the second and third metatarsal shafts. Soft tissue swelling involving the foot and ankle which may be reactive versus secondary to cellulitis. Electronically Signed   By: Kathreen Devoid M.D.   On: 11/09/2020 13:45   ECHOCARDIOGRAM COMPLETE  Result Date: 11/10/2020    ECHOCARDIOGRAM REPORT   Patient Name:   Autumn [name] Date of Exam: 11/10/2020 Medical Rec #:  [id number]          Height:       66.0 in Accession #:    [id number]         Weight:       190.0 lb Date of Birth:  1950-08-04          BSA:          1.957 m Patient Age:    39 years           BP:           141/94 mmHg Patient Gender: F                  HR:           100 bpm. Exam Location:  [facility] Procedure: 2D Echo and Intracardiac Opacification Agent Indications:     Atrial Fibrillation  History:         Patient has no prior history of Echocardiogram examinations.                  Risk Factors:Hypertension, Former Smoker and Diabetes.  Sonographer:     L Thornton-Maynard Referring Phys:  [id number] AGBATA Diagnosing Phys: Skeet Latch MD   Sonographer Comments: Suboptimal parasternal window. IMPRESSIONS  1. Left ventricular ejection fraction, by estimation, is 55 to 60%. The left ventricle has normal function. The left ventricle has no regional wall motion abnormalities. There is mild concentric left ventricular hypertrophy. Left ventricular diastolic parameters are indeterminate. Elevated left ventricular end-diastolic pressure.  2. Right ventricular systolic function is normal. The right ventricular size is normal. There is normal pulmonary artery systolic pressure.  3. Left atrial size was severely dilated.  4. The mitral valve is normal in structure. Trivial mitral valve regurgitation. No evidence of mitral stenosis.  5. The  aortic valve is tricuspid. Aortic valve regurgitation is not visualized. No aortic stenosis is present.  6. The inferior vena cava is normal in size with greater than 50% respiratory variability, suggesting right atrial pressure of 3 mmHg.  7. There is a small patent foramen ovale. FINDINGS  Left Ventricle: Left ventricular ejection fraction, by estimation, is 55 to 60%. The left ventricle has normal function. The left ventricle has no regional wall motion abnormalities. Definity contrast agent was given IV to delineate the left ventricular  endocardial borders. The left ventricular internal cavity size was normal in size. There is mild concentric left ventricular hypertrophy. Left ventricular diastolic parameters are indeterminate. Elevated left ventricular end-diastolic pressure. Right Ventricle: The right ventricular size is normal. No increase in right ventricular wall thickness. Right ventricular systolic function is normal. There is normal pulmonary artery systolic pressure. The tricuspid regurgitant velocity is 2.55 m/s, and  with an assumed right atrial pressure of 3 mmHg, the estimated right ventricular systolic pressure is 16.0 mmHg. Left Atrium: Left atrial size was severely dilated. Right Atrium: Right atrial size  was normal in size. Pericardium: There is no evidence of pericardial effusion. Mitral Valve: The mitral valve is normal in structure. Mild mitral annular calcification. Trivial mitral valve regurgitation. No evidence of mitral valve stenosis. MV peak gradient, 6.4 mmHg. The mean mitral valve gradient is 3.0 mmHg. Tricuspid Valve: The tricuspid valve is normal in structure. Tricuspid valve regurgitation is trivial. No evidence of tricuspid stenosis. Aortic Valve: The aortic valve is tricuspid. Aortic valve regurgitation is not visualized. No aortic stenosis is present. Aortic valve mean gradient measures 5.0 mmHg. Aortic valve peak gradient measures 8.1 mmHg. Aortic valve area, by VTI measures 1.42 cm. Pulmonic Valve: The pulmonic valve was normal in structure. Pulmonic valve regurgitation is not visualized. No evidence of pulmonic stenosis. Aorta: The aortic root is normal in size and structure. Venous: The inferior vena cava is normal in size with greater than 50% respiratory variability, suggesting right atrial pressure of 3 mmHg. IAS/Shunts: No atrial level shunt detected by color flow Doppler. A small patent foramen ovale is detected.  LEFT VENTRICLE PLAX 2D LVIDd:         4.00 cm      Diastology LVIDs:         2.80 cm      LV e' medial:    5.19 cm/s LV PW:         1.10 cm      LV E/e' medial:  22.2 LV IVS:        1.40 cm      LV e' lateral:   7.62 cm/s LVOT diam:     1.80 cm      LV E/e' lateral: 15.1 LV SV:         34 LV SV Index:   17 LVOT Area:     2.54 cm  LV Volumes (MOD) LV vol d, MOD A2C: 102.0 ml LV vol d, MOD A4C: 107.0 ml LV vol s, MOD A2C: 27.7 ml LV vol s, MOD A4C: 36.5 ml LV SV MOD A2C:     74.3 ml LV SV MOD A4C:     107.0 ml LV SV MOD BP:      70.1 ml RIGHT VENTRICLE RV S prime:     11.70 cm/s TAPSE (M-mode): 2.0 cm LEFT ATRIUM              Index LA diam:        3.70  cm  1.89 cm/m LA Vol (A2C):   137.0 ml 70.02 ml/m LA Vol (A4C):   128.0 ml 65.42 ml/m LA Biplane Vol: 134.0 ml 68.48 ml/m   AORTIC VALVE                     PULMONIC VALVE AV Area (Vmax):    1.37 cm      PV Vmax:       1.04 m/s AV Area (Vmean):   1.56 cm      PV Peak grad:  4.3 mmHg AV Area (VTI):     1.42 cm AV Vmax:           142.00 cm/s AV Vmean:          101.000 cm/s AV VTI:            0.239 m AV Peak Grad:      8.1 mmHg AV Mean Grad:      5.0 mmHg LVOT Vmax:         76.40 cm/s LVOT Vmean:        62.000 cm/s LVOT VTI:          0.133 m LVOT/AV VTI ratio: 0.56  AORTA Ao Root diam: 3.20 cm MITRAL VALVE                TRICUSPID VALVE MV Area (PHT): 2.99 cm     TR Peak grad:   26.0 mmHg MV Area VTI:   1.66 cm     TR Vmax:        255.00 cm/s MV Peak grad:  6.4 mmHg MV Mean grad:  3.0 mmHg     SHUNTS MV Vmax:       1.26 m/s     Systemic VTI:  0.13 m MV Vmean:      77.4 cm/s    Systemic Diam: 1.80 cm MV Decel Time: 254 msec MV E velocity: 115.33 cm/s Skeet Latch MD Electronically signed by Skeet Latch MD Signature Date/Time: 11/10/2020/10:52:52 AM    Final      Medications:    sodium chloride 10 mL/hr at 11/11/20 0615   ceFEPime (MAXIPIME) IV 2 g (11/10/20 1729)   heparin 1,800 Units/hr (11/11/20 0617)   lactated ringers 50 mL/hr at 11/11/20 9518   metronidazole 500 mg (11/11/20 0618)   vancomycin 1,000 mg (11/10/20 2222)    allopurinol  200 mg Oral Daily   diltiazem  60 mg Oral Q6H   ferrous gluconate  324 mg Oral Q breakfast   insulin aspart  0-5 Units Subcutaneous QHS   insulin aspart  0-6 Units Subcutaneous TID WC   levothyroxine  100 mcg Oral Q0600   metoprolol tartrate  25 mg Oral BID   multivitamin with minerals  1 tablet Oral Daily   nutrition supplement (JUVEN)  1 packet Oral BID BM   Ensure Max Protein  11 oz Oral QHS   vancomycin variable dose per unstable renal function (pharmacist dosing)   Does not apply See admin instructions   venlafaxine  100 mg Oral BID   sodium chloride, [DISCONTINUED] acetaminophen **OR** acetaminophen, acetaminophen, albuterol, meclizine, ondansetron **OR**  ondansetron (ZOFRAN) IV  Assessment/ Plan:  Autumn [name] is a 70 y.o.  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 Will continue to monitor renal function.     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

## 2020-11-11 NOTE — Progress Notes (Signed)
Progress Note  Patient Name: Autumn [name] Date of Encounter: 11/11/2020  [facility] Cardiologist: Marcille Blanco  Subjective   Pain in left foot noted. No chest pain, shortness of breath, or palpitations.  Feels nauseated today.  Plans for OR today for treatment of left foot osteomyelitis.  Inpatient Medications    Scheduled Meds:  allopurinol  200 mg Oral Daily   diltiazem  60 mg Oral Q6H   ferrous gluconate  324 mg Oral Q breakfast   insulin aspart  0-5 Units Subcutaneous QHS   insulin aspart  0-6 Units Subcutaneous TID WC   levothyroxine  100 mcg Oral Q0600   metoprolol tartrate  25 mg Oral BID   multivitamin with minerals  1 tablet Oral Daily   nutrition supplement (JUVEN)  1 packet Oral BID BM   Ensure Max Protein  11 oz Oral QHS   vancomycin variable dose per unstable renal function (pharmacist dosing)   Does not apply See admin instructions   venlafaxine  100 mg Oral BID   Continuous Infusions:  sodium chloride 10 mL/hr at 11/11/20 0615   ceFEPime (MAXIPIME) IV 2 g (11/10/20 1729)   heparin 1,800 Units/hr (11/11/20 0617)   lactated ringers 50 mL/hr at 11/11/20 0619   metronidazole 500 mg (11/11/20 0618)   vancomycin 1,000 mg (11/10/20 2222)   PRN Meds: sodium chloride, [DISCONTINUED] acetaminophen **OR** acetaminophen, acetaminophen, albuterol, meclizine, ondansetron **OR** ondansetron (ZOFRAN) IV   Vital Signs    Vitals:   11/10/20 2214 11/11/20 0009 11/11/20 0501 11/11/20 0903  BP: (!) 129/91 (!) 146/98 112/82 133/83  Pulse: (!) 104 89 74 (!) 104  Resp: 20 20 20 18   Temp: 99.1 F (37.3 C) 98.6 F (37 C) 98 F (36.7 C) 98 F (36.7 C)  TempSrc:  Oral Oral   SpO2: 97% 98% 93% 91%  Weight:      Height:        Intake/Output Summary (Last 24 hours) at 11/11/2020 1123 Last data filed at 11/11/2020 0400 Gross per 24 hour  Intake 1323.06 ml  Output --  Net 1323.06 ml   Last 3 Weights 11/09/2020 09/02/2018 03/30/2017  Weight (lbs) 190 lb 208  lb 215 lb  Weight (kg) 86.183 kg 94.348 kg 97.523 kg      Telemetry    Atrial fibrillation, ventricular rates 75-130 bpm - Personally Reviewed  ECG    No new tracing.  Physical Exam   GEN: No acute distress.   Neck: No JVD Cardiac: Irregularly irregular w/o murmurs. Respiratory: Diminished breath sounds throughout with poor inspiratory effort.  No wheezes or crackles. GI: Soft, nontender, non-distended  MS: Left foot/ankle swollen and warm wi/o pitting edema; No deformity. Neuro:  Nonfocal  Psych: Normal affect   Labs    High Sensitivity Troponin:   Recent Labs  Lab 11/09/20 1220 11/09/20 1818 11/10/20 0115 11/10/20 0649  TROPONINIHS 147* 151* 162* 149*     Chemistry Recent Labs  Lab 11/09/20 1220 11/09/20 1818 11/10/20 0649 11/11/20 0636  NA 132*  --  132* 132*  K 3.2*  --  3.5 4.0  CL 102  --  103 104  CO2 18*  --  18* 18*  GLUCOSE 164*  --  152* 133*  BUN 69*  --  64* 72*  CREATININE 3.65*  --  3.50* 3.65*  CALCIUM 8.4*  --  8.2* 8.6*  MG  --  2.0  --   --   PROT 6.4*  --   --   --  ALBUMIN 2.7*  --   --   --   AST 35  --   --   --   ALT 25  --   --   --   ALKPHOS 84  --   --   --   BILITOT 0.4  --   --   --   GFRNONAA 13*  --  14* 13*  ANIONGAP 12  --  11 10    Lipids  Recent Labs  Lab 11/10/20 1147  CHOL 101  TRIG 149  HDL 21*  LDLCALC 50  CHOLHDL 4.8    Hematology Recent Labs  Lab 11/09/20 1220 11/10/20 0649 11/11/20 0636  WBC 15.8* 13.5* 15.6*  RBC 2.84* 2.61* 2.62*  HGB 8.0* 7.2* 7.4*  HCT 24.5* 22.3* 22.6*  MCV 86.3 85.4 86.3  MCH 28.2 27.6 28.2  MCHC 32.7 32.3 32.7  RDW 14.5 14.6 14.7  PLT 190 180 185   Thyroid  Recent Labs  Lab 11/09/20 1818  TSH 0.526    BNP Recent Labs  Lab 11/09/20 1220  BNP 360.1*    DDimer No results for input(s): DDIMER in the last 168 hours.   Radiology    CT Head Wo Contrast  Result Date: 11/09/2020 CLINICAL DATA:  70 year old female with acute altered mental status. EXAM: CT  HEAD WITHOUT CONTRAST TECHNIQUE: Contiguous axial images were obtained from the base of the skull through the vertex without intravenous contrast. COMPARISON:  None. FINDINGS: Brain: No evidence of acute infarction, hemorrhage, hydrocephalus, extra-axial collection or mass lesion/mass effect. Vascular: Carotid and vertebral atherosclerotic calcifications are noted. Skull: No acute abnormality Sinuses/Orbits: No acute abnormality Other: None IMPRESSION: No evidence of acute intracranial abnormality. Electronically Signed   By: Margarette Canada M.D.   On: 11/09/2020 13:05   US RENAL  Result Date: 11/09/2020 CLINICAL DATA:  Acute kidney injury. EXAM: RENAL / URINARY TRACT ULTRASOUND COMPLETE COMPARISON:  None. FINDINGS: Right Kidney: Renal measurements: 11.3 x 5.2 x 4.5 cm = volume: 138 mL. Increased parenchymal echogenicity. No hydronephrosis. No visualized cystic or solid lesion. No stone. Left Kidney: Renal measurements: 8.7 x 4.9 x 5.4 cm = volume: 120 mL. Increased parenchymal echogenicity. No hydronephrosis. There is a 1 cm cyst in the upper pole. No evidence of solid lesion or stone. Bladder: Appears normal for degree of bladder distention. Other: Incidental splenomegaly with spleen spanning 14.3 x 12.4 x 5.6 cm, splenic volume of 517 cc. IMPRESSION: 1. Increased bilateral renal parenchymal echogenicity consistent with chronic medical renal disease. No obstructive uropathy. 2. Simple cyst in the upper left kidney. 3. Incidental splenomegaly. Electronically Signed   By: Keith Rake M.D.   On: 11/09/2020 17:12   MR FOOT LEFT WO CONTRAST  Result Date: 11/10/2020 CLINICAL DATA:  Foot pain and swelling. EXAM: MRI OF THE LEFT FOOT WITHOUT CONTRAST TECHNIQUE: Multiplanar, multisequence MR imaging of the left foot was performed. No intravenous contrast was administered. COMPARISON:  None. FINDINGS: Bones/Joint/Cartilage Soft tissue wound along the plantar aspect of the forefoot overlying the second metatarsal.  Bone destruction of the second metatarsal head with severe bone marrow edema throughout the second metatarsal. Bone destruction of the second proximal phalanx. Severe soft tissue edema around the second MTP joint. Bone marrow edema in the third metatarsal head. No acute fracture. Dorsal dislocation of the second proximal phalanx. 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 Ligaments Collateral ligaments are intact.  Lisfranc ligament is intact. Muscles and Tendons Flexor, peroneal and extensor compartment tendons are  intact. Generalized muscle atrophy. Soft tissue Severe soft tissue swelling around the second metatarsal neck concerning for a phlegmon/developing abscess measuring 2.1 x 1 cm circumferentially around the distal shaft. No soft tissue mass. Severe soft tissue edema of the forefoot consistent with cellulitis primarily along the dorsal aspect. IMPRESSION: 1. 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 2. Severe soft tissue swelling around the second metatarsal neck concerning for a phlegmon/developing abscess measuring 2.1 x 1 cm circumferentially around the distal shaft. 3. Mild early osteomyelitis of the third metatarsal head. 4. Severe soft tissue edema of the forefoot consistent with cellulitis primarily along the dorsal aspect. Electronically Signed   By: Kathreen Devoid M.D.   On: 11/10/2020 09:14   US Venous Img Lower Unilateral Left  Result Date: 11/09/2020 CLINICAL DATA:  pain swelling EXAM: LEFT LOWER EXTREMITY VENOUS DOPPLER ULTRASOUND TECHNIQUE: Gray-scale sonography with graded compression, as well as color Doppler and duplex ultrasound were performed to evaluate the lower extremity deep venous systems from the level of the common femoral vein and including the common femoral, femoral, profunda femoral, popliteal and calf veins including the posterior tibial, peroneal and gastrocnemius veins  when visible. The superficial great saphenous vein was also interrogated. Spectral Doppler was utilized to evaluate flow at rest and with distal augmentation maneuvers in the common femoral, femoral and popliteal veins. COMPARISON:  None. FINDINGS: Contralateral Common Femoral Vein: Respiratory phasicity is normal and symmetric with the symptomatic side. No evidence of thrombus. Normal compressibility. Common Femoral Vein: No evidence of thrombus. Normal compressibility, respiratory phasicity and response to augmentation. Saphenofemoral Junction: No evidence of thrombus. Normal compressibility and flow on color Doppler imaging. Profunda Femoral Vein: No evidence of thrombus. Normal compressibility and flow on color Doppler imaging. Femoral Vein: No evidence of thrombus. Normal compressibility, respiratory phasicity and response to augmentation. Popliteal Vein: No evidence of thrombus. Normal compressibility, respiratory phasicity and response to augmentation. Calf Veins: No evidence of thrombus. Normal compressibility and flow on color Doppler imaging. Other Findings:  Calf edema. IMPRESSION: No evidence of deep venous thrombosis.  Calf edema is present. Electronically Signed   By: Albin Felling M.D.   On: 11/09/2020 14:44   DG Chest Portable 1 View  Result Date: 11/09/2020 CLINICAL DATA:  Altered mental status and fall EXAM: PORTABLE CHEST 1 VIEW COMPARISON:  07/26/2007 and other studies FINDINGS: The cardiomediastinal silhouette is unremarkable. There is no evidence of focal airspace disease, pulmonary edema, suspicious pulmonary nodule/mass, pleural effusion, or pneumothorax. No acute bony abnormalities are identified. IMPRESSION: No active disease. Electronically Signed   By: Margarette Canada M.D.   On: 11/09/2020 13:06   DG Foot Complete Left  Result Date: 11/09/2020 CLINICAL DATA:  Pain and swelling.  Ulcer at the bottom of the foot. EXAM: LEFT FOOT - COMPLETE 3+ VIEW COMPARISON:  None. FINDINGS: Soft  tissue wound along the plantar aspect of the forefoot. Cortical irregularity of the third metatarsal head concerning for osteomyelitis. Irregularity of the second metatarsal head concerning for osteomyelitis. Mild periosteal reaction involving the second and third metatarsal shafts. Dorsal subluxation of the second and third proximal phalanx. Soft tissue emphysema in the second toe. Severe soft tissue swelling involving the foot and ankle. IMPRESSION: Cortical irregularity of the third metatarsal head concerning for osteomyelitis. Irregularity of the second metatarsal head concerning for osteomyelitis. Mild periosteal reaction involving the second and third metatarsal shafts. Soft tissue swelling involving the foot and ankle which may  be reactive versus secondary to cellulitis. Electronically Signed   By: Kathreen Devoid M.D.   On: 11/09/2020 13:45   ECHOCARDIOGRAM COMPLETE  Result Date: 11/10/2020    ECHOCARDIOGRAM REPORT   Patient Name:   Autumn [name] Date of Exam: 11/10/2020 Medical Rec #:  [id number]          Height:       66.0 in Accession #:    [id number]         Weight:       190.0 lb Date of Birth:  10/28/1950          BSA:          1.957 m Patient Age:    70 years           BP:           141/94 mmHg Patient Gender: F                  HR:           100 bpm. Exam Location:  [facility] Procedure: 2D Echo and Intracardiac Opacification Agent Indications:     Atrial Fibrillation  History:         Patient has no prior history of Echocardiogram examinations.                  Risk Factors:Hypertension, Former Smoker and Diabetes.  Sonographer:     L Thornton-Maynard Referring Phys:  [id number] IRJJOACZ AGBATA Diagnosing Phys: Skeet Latch MD  Sonographer Comments: Suboptimal parasternal window. IMPRESSIONS  1. Left ventricular ejection fraction, by estimation, is 55 to 60%. The left ventricle has normal function. The left ventricle has no regional wall motion abnormalities. There is mild concentric left  ventricular hypertrophy. Left ventricular diastolic parameters are indeterminate. Elevated left ventricular Laini Urick-diastolic pressure.  2. Right ventricular systolic function is normal. The right ventricular size is normal. There is normal pulmonary artery systolic pressure.  3. Left atrial size was severely dilated.  4. The mitral valve is normal in structure. Trivial mitral valve regurgitation. No evidence of mitral stenosis.  5. The aortic valve is tricuspid. Aortic valve regurgitation is not visualized. No aortic stenosis is present.  6. The inferior vena cava is normal in size with greater than 50% respiratory variability, suggesting right atrial pressure of 3 mmHg.  7. There is a small patent foramen ovale. FINDINGS  Left Ventricle: Left ventricular ejection fraction, by estimation, is 55 to 60%. The left ventricle has normal function. The left ventricle has no regional wall motion abnormalities. Definity contrast agent was given IV to delineate the left ventricular  endocardial borders. The left ventricular internal cavity size was normal in size. There is mild concentric left ventricular hypertrophy. Left ventricular diastolic parameters are indeterminate. Elevated left ventricular Jaisha Villacres-diastolic pressure. Right Ventricle: The right ventricular size is normal. No increase in right ventricular wall thickness. Right ventricular systolic function is normal. There is normal pulmonary artery systolic pressure. The tricuspid regurgitant velocity is 2.55 m/s, and  with an assumed right atrial pressure of 3 mmHg, the estimated right ventricular systolic pressure is 66.0 mmHg. Left Atrium: Left atrial size was severely dilated. Right Atrium: Right atrial size was normal in size. Pericardium: There is no evidence of pericardial effusion. Mitral Valve: The mitral valve is normal in structure. Mild mitral annular calcification. Trivial mitral valve regurgitation. No evidence of mitral valve stenosis. MV peak gradient, 6.4  mmHg. The mean mitral valve gradient is 3.0 mmHg. Tricuspid Valve: The  tricuspid valve is normal in structure. Tricuspid valve regurgitation is trivial. No evidence of tricuspid stenosis. Aortic Valve: The aortic valve is tricuspid. Aortic valve regurgitation is not visualized. No aortic stenosis is present. Aortic valve mean gradient measures 5.0 mmHg. Aortic valve peak gradient measures 8.1 mmHg. Aortic valve area, by VTI measures 1.42 cm. Pulmonic Valve: The pulmonic valve was normal in structure. Pulmonic valve regurgitation is not visualized. No evidence of pulmonic stenosis. Aorta: The aortic root is normal in size and structure. Venous: The inferior vena cava is normal in size with greater than 50% respiratory variability, suggesting right atrial pressure of 3 mmHg. IAS/Shunts: No atrial level shunt detected by color flow Doppler. A small patent foramen ovale is detected.  LEFT VENTRICLE PLAX 2D LVIDd:         4.00 cm      Diastology LVIDs:         2.80 cm      LV e' medial:    5.19 cm/s LV PW:         1.10 cm      LV E/e' medial:  22.2 LV IVS:        1.40 cm      LV e' lateral:   7.62 cm/s LVOT diam:     1.80 cm      LV E/e' lateral: 15.1 LV SV:         34 LV SV Index:   17 LVOT Area:     2.54 cm  LV Volumes (MOD) LV vol d, MOD A2C: 102.0 ml LV vol d, MOD A4C: 107.0 ml LV vol s, MOD A2C: 27.7 ml LV vol s, MOD A4C: 36.5 ml LV SV MOD A2C:     74.3 ml LV SV MOD A4C:     107.0 ml LV SV MOD BP:      70.1 ml RIGHT VENTRICLE RV S prime:     11.70 cm/s TAPSE (M-mode): 2.0 cm LEFT ATRIUM              Index LA diam:        3.70 cm  1.89 cm/m LA Vol (A2C):   137.0 ml 70.02 ml/m LA Vol (A4C):   128.0 ml 65.42 ml/m LA Biplane Vol: 134.0 ml 68.48 ml/m  AORTIC VALVE                     PULMONIC VALVE AV Area (Vmax):    1.37 cm      PV Vmax:       1.04 m/s AV Area (Vmean):   1.56 cm      PV Peak grad:  4.3 mmHg AV Area (VTI):     1.42 cm AV Vmax:           142.00 cm/s AV Vmean:          101.000 cm/s AV VTI:             0.239 m AV Peak Grad:      8.1 mmHg AV Mean Grad:      5.0 mmHg LVOT Vmax:         76.40 cm/s LVOT Vmean:        62.000 cm/s LVOT VTI:          0.133 m LVOT/AV VTI ratio: 0.56  AORTA Ao Root diam: 3.20 cm MITRAL VALVE                TRICUSPID VALVE MV Area (PHT): 2.99 cm  TR Peak grad:   26.0 mmHg MV Area VTI:   1.66 cm     TR Vmax:        255.00 cm/s MV Peak grad:  6.4 mmHg MV Mean grad:  3.0 mmHg     SHUNTS MV Vmax:       1.26 m/s     Systemic VTI:  0.13 m MV Vmean:      77.4 cm/s    Systemic Diam: 1.80 cm MV Decel Time: 254 msec MV E velocity: 115.33 cm/s Skeet Latch MD Electronically signed by Skeet Latch MD Signature Date/Time: 11/10/2020/10:52:52 AM    Final     Cardiac Studies   See TTE above.  Patient Profile     70 y.o. female with history of HFpEF, CKD stage IV-V, diabetes since her late 33s to early 90s with diabetic neuropathy and diabetic foot ulcer, HTN, anemia of chronic disease, hepatic cirrhosis, sleep apnea, and gout, admitted with left foot osteomyelitis complicated by possible syncope and newly diagnosed atrial fibrillation.  Assessment & Plan    Atrial fibrillation with rapid ventricular response: Rate control improved. -Continue diltiazem 60 mg Q6 hours with transition to once daily dosing prior to discharge. -Continue metoprolol tartrate 25 mg daily -Continue heparin infusion with plans to transition to NOAC when possible from post-surgical standpoint. -Could consider TEE/DCCV prior to discharge, but given adequate rate control without symptoms, may be better to pursue rate control strategy until osteomyelitis has been adequately treated and worsening anemia addressed.  Elevated troponin: Most likely demand ischemia, though patient is certainly at risk for ischemic heart disease. -Consider outpatient Myoview.  Avoid catheterization in the setting of advanced CKD, worsening anemia, and acute infection, particularly given lack of ischemic symptoms. -Defer  ASA in setting of anticoagulation and anemia. -Defer statin with LDL of 50.  Sepsis with left foot osteomyelitis: -Per IM and podiatry.    For questions or updates, please contact [place] Please consult [email or web address] for contact info under [facility].     Signed, Nelva Bush, MD  11/11/2020, 11:23 AM

## 2020-11-11 NOTE — Progress Notes (Signed)
Patient ID: Autumn [name], female   DOB: Jul 25, 1950, 70 y.o.   MRN: [id number] [facility] PROGRESS NOTE  DAMONIQUE BRUNELLE GDJ:[id number] DOB: 08-26-1950 DOA: 11/09/2020 PCP: Harlow Ohms, MD  HPI/Subjective: Patient seen this morning prior to operating room.  States her breathing is okay.  No complaints of chest pain.  Patient admitted with clinical sepsis and left foot osteomyelitis.  Objective: Vitals:   11/11/20 1151 11/11/20 1347  BP: 105/70 110/89  Pulse: 82 69  Resp: 18 18  Temp: 98.3 F (36.8 C) (!) 97.5 F (36.4 C)  SpO2: 91% 96%    Intake/Output Summary (Last 24 hours) at 11/11/2020 1544 Last data filed at 11/11/2020 1528 Gross per 24 hour  Intake 2269.22 ml  Output --  Net 2269.22 ml   Filed Weights   11/09/20 1214  Weight: 86.2 kg    ROS: Review of Systems  Respiratory:  Negative for shortness of breath.   Cardiovascular:  Negative for chest pain.  Gastrointestinal:  Negative for abdominal pain, nausea and vomiting.  Musculoskeletal:  Positive for joint pain.  Exam: Physical Exam HENT:     Head: Normocephalic.     Mouth/Throat:     Pharynx: No oropharyngeal exudate.  Eyes:     General: Lids are normal.     Conjunctiva/sclera: Conjunctivae normal.     Pupils: Pupils are equal, round, and reactive to light.  Cardiovascular:     Rate and Rhythm: Normal rate. Rhythm irregularly irregular.     Heart sounds: Normal heart sounds, S1 normal and S2 normal.  Pulmonary:     Breath sounds: Examination of the right-lower field reveals decreased breath sounds. Examination of the left-lower field reveals decreased breath sounds. Decreased breath sounds present. No wheezing, rhonchi or rales.  Abdominal:     Palpations: Abdomen is soft.     Tenderness: There is no abdominal tenderness.  Musculoskeletal:     Right lower leg: Swelling present.     Left lower leg: Swelling present.  Skin:    General: Skin is warm.     Comments: Erythema left foot and  some draining ulcers on the bottom of her foot.  Neurological:     Mental Status: She is alert and oriented to person, place, and time.      Scheduled Meds:  [MAR Hold] allopurinol  200 mg Oral Daily   [MAR Hold] diltiazem  60 mg Oral Q6H   [MAR Hold] ferrous gluconate  324 mg Oral Q breakfast   [MAR Hold] insulin aspart  0-5 Units Subcutaneous QHS   [MAR Hold] insulin aspart  0-6 Units Subcutaneous TID WC   [MAR Hold] levothyroxine  100 mcg Oral Q0600   [MAR Hold] metoprolol tartrate  25 mg Oral BID   [MAR Hold] multivitamin with minerals  1 tablet Oral Daily   [MAR Hold] nutrition supplement (JUVEN)  1 packet Oral BID BM   [MAR Hold] Ensure Max Protein  11 oz Oral QHS   [MAR Hold] vancomycin variable dose per unstable renal function (pharmacist dosing)   Does not apply See admin instructions   [MAR Hold] venlafaxine  100 mg Oral BID   Continuous Infusions:  [MAR Hold] sodium chloride Stopped (11/11/20 0850)   [MAR Hold] ceFEPime (MAXIPIME) IV 2 g (11/10/20 1729)   heparin Stopped (11/11/20 1115)   lactated ringers 50 mL/hr at 11/11/20 1447   [MAR Hold] metronidazole 0 mg (11/11/20 0718)   [MAR Hold] vancomycin 1,000 mg (11/10/20 2222)    Assessment/Plan:  Clinical sepsis, present on admission with left foot osteomyelitis present on admission.  Patient initially had tachycardia, tachypnea and leukocytosis.  Patient with acute kidney injury on chronic kidney disease stage IV.  MRI of the foot shows abscess and osteomyelitis of the third metatarsal head and second proximal phalanx and metatarsal head.  Operating room today by Dr. Sherryle Lis.  Continue antibiotics vancomycin metronidazole and Maxipime. Acute kidney injury on chronic kidney disease stage IV.  Baseline creatinine as outpatient 3.19.  Creatinine 3.65 today.  Nephrology is following.  On gentle IV fluid hydration. Atrial fibrillation with rapid ventricular response.  Held heparin drip for surgery today.  Cardizem drip  tapered off yesterday and is on metoprolol and Cardizem. Type 2 diabetes mellitus with chronic kidney disease stage IV.  On sliding scale insulin. Anemia of chronic disease.  Continue to monitor hemoglobin.  Hemoglobin 7.4 today.  May end up needing transfusion during the hospital course. [name] with liver cirrhosis Frequent falls Elevated troponin likely demand ischemia from sepsis Hypothyroidism unspecified on levothyroxine    Code Status:     Code Status Orders  (From admission, onward)           Start     Ordered   11/09/20 1556  Full code  Continuous        11/09/20 1555           Code Status History     This patient has a current code status but no historical code status.      Family Communication: Updated family yesterday Disposition Plan: Status is: Inpatient  Consultants: Podiatry Cardiology Nephrology  Procedures: Amputation second ray and open biopsy of third metatarsal head  Antibiotics: Vancomycin cefepime and Flagyl  Time spent: 27 minutes  [place]

## 2020-11-11 NOTE — Brief Op Note (Signed)
11/09/2020 - 11/11/2020  3:51 PM  PATIENT:  Autumn [name]  70 y.o. female  PRE-OPERATIVE DIAGNOSIS:  Osteomyelitis  POST-OPERATIVE DIAGNOSIS:  * No post-op diagnosis entered *  PROCEDURE:  Procedure(s): AMPUTATION RAY - 2nd, bone biopsy 3rd metatarsal head  SURGEON:  Surgeon(s) and Role:    * Seri Kimmer, Stephan Minister, DPM - Primary  PHYSICIAN ASSISTANT:   ASSISTANTS: none   ANESTHESIA:   local and MAC  EBL:  <20cc   BLOOD ADMINISTERED:none  DRAINS: none   LOCAL MEDICATIONS USED:  MARCAINE   , LIDOCAINE , and Amount: 20 ml in 50/50 mix  SPECIMEN:  No Specimen  DISPOSITION OF SPECIMEN:   path/micro (swab of abscess, tendon sheath; bone culture of 2nd and 3rd met, bone biopsy of 2nd and 3rd met   COUNTS:  YES  TOURNIQUET:  * Missing tourniquet times found for documented tourniquets in log: [id number] *  DICTATION: .Note written in EPIC  PLAN OF CARE: Admit to inpatient   PATIENT DISPOSITION:  PACU - hemodynamically stable.   Delay start of Pharmacological VTE agent (>24hrs) due to surgical blood loss or risk of bleeding: yes

## 2020-11-11 NOTE — Consult Note (Signed)
ANTICOAGULATION CONSULT NOTE   Pharmacy Consult for Heparin gtt Indication: atrial fibrillation  Allergies  Allergen Reactions   Codeine Nausea And Vomiting    Patient Measurements: Height: 5\' 6"  (167.6 cm) Weight: 86.2 kg (190 lb) IBW/kg (Calculated) : 59.3 Heparin Dosing Weight: 77.8 kg  Vital Signs: Temp: 98 F (36.7 C) (10/31 0903) Temp Source: Oral (10/31 0501) BP: 133/83 (10/31 0903) Pulse Rate: 104 (10/31 0903)  Labs: Recent Labs    11/09/20 1220 11/09/20 1220 11/09/20 1818 11/09/20 1825 11/10/20 0115 11/10/20 0649 11/10/20 1147 11/10/20 2059 11/11/20 0636 11/11/20 0704  HGB 8.0*  --   --   --   --  7.2*  --   --  7.4*  --   HCT 24.5*  --   --   --   --  22.3*  --   --  22.6*  --   PLT 190  --   --   --   --  180  --   --  185  --   APTT  --   --   --  41*  --   --   --   --   --   --   LABPROT  --   --  16.4*  --   --   --   --   --   --   --   INR  --   --  1.3*  --   --   --   --   --   --   --   HEPARINUNFRC  --    < >  --   --  0.10*  --  0.28* 0.18*  --  0.43  CREATININE 3.65*  --   --   --   --  3.50*  --   --  3.65*  --   CKTOTAL  --   --  184  --   --   --   --   --   --   --   TROPONINIHS 147*  --  151*  --  162* 149*  --   --   --   --    < > = values in this interval not displayed.     Estimated Creatinine Clearance: 16.3 mL/min (A) (by C-G formula based on SCr of 3.65 mg/dL (H)).   Medical History: Past Medical History:  Diagnosis Date   Diabetes mellitus without complication ([place])    Hypertension    Kidney stone    Thyroid disease     Medications:  Scheduled:   allopurinol  200 mg Oral Daily   diltiazem  60 mg Oral Q6H   ferrous gluconate  324 mg Oral Q breakfast   insulin aspart  0-5 Units Subcutaneous QHS   insulin aspart  0-6 Units Subcutaneous TID WC   levothyroxine  100 mcg Oral Q0600   metoprolol tartrate  25 mg Oral BID   multivitamin with minerals  1 tablet Oral Daily   nutrition supplement (JUVEN)  1 packet Oral BID  BM   Ensure Max Protein  11 oz Oral QHS   vancomycin variable dose per unstable renal function (pharmacist dosing)   Does not apply See admin instructions   venlafaxine  100 mg Oral BID    Assessment: Patient admitted with new onset A/Fib and worsening renal function. Antibiotics for osteomyelitis were initiated and pharmacy consulted for heparin drip management.  Noted history of DM, CKD-stage 4,HTN, thyroid disorder, and liver cirrhosis  secondary to NASH  10/30:  HL @ 0115 = 0.10, subtherapeutic  10/30:  HL @ 2051 = 0.18, subtherapeutic increase drip rate to 1800 units/hr 10/31:  HL @ 0704 = 0.43, therapeutic.   Goal of Therapy:  Heparin level 0.3-0.7 units/ml Monitor platelets by anticoagulation protocol: Yes   Plan:  Heparin level is therapeutic. Will continue heparin infusion at 1800 units/hr. Recheck heparin level in 8 hours. CBC daily while on heparin.    Oswald Hillock 11/11/2020 9:44 AM

## 2020-11-11 NOTE — Transfer of Care (Signed)
Immediate Anesthesia Transfer of Care Note  Patient: Autumn [name]  Procedure(s) Performed: AMPUTATION RAY - 2nd and possible 3rd (Left)  Patient Location: PACU  Anesthesia Type:General  Level of Consciousness: drowsy and patient cooperative  Airway & Oxygen Therapy: Patient Spontanous Breathing and Patient connected to face mask oxygen  Post-op Assessment: Report given to RN and Post -op Vital signs reviewed and stable  Post vital signs: Reviewed and stable  Last Vitals:  Vitals Value Taken Time  BP 111/76 11/11/20 1554  Temp    Pulse 88 11/11/20 1559  Resp 17 11/11/20 1559  SpO2 99 % 11/11/20 1559  Vitals shown include unvalidated device data.  Last Pain:  Vitals:   11/11/20 1347  TempSrc: Oral  PainSc: 0-No pain         Complications: No notable events documented.

## 2020-11-12 ENCOUNTER — Inpatient Hospital Stay: Payer: Medicare (Managed Care)

## 2020-11-12 DIAGNOSIS — L02619 Cutaneous abscess of unspecified foot: Secondary | ICD-10-CM

## 2020-11-12 DIAGNOSIS — L089 Local infection of the skin and subcutaneous tissue, unspecified: Secondary | ICD-10-CM

## 2020-11-12 DIAGNOSIS — M86172 Other acute osteomyelitis, left ankle and foot: Secondary | ICD-10-CM | POA: Diagnosis not present

## 2020-11-12 DIAGNOSIS — N179 Acute kidney failure, unspecified: Secondary | ICD-10-CM

## 2020-11-12 DIAGNOSIS — R062 Wheezing: Secondary | ICD-10-CM

## 2020-11-12 DIAGNOSIS — R6520 Severe sepsis without septic shock: Secondary | ICD-10-CM

## 2020-11-12 DIAGNOSIS — N189 Chronic kidney disease, unspecified: Secondary | ICD-10-CM

## 2020-11-12 DIAGNOSIS — E11628 Type 2 diabetes mellitus with other skin complications: Secondary | ICD-10-CM

## 2020-11-12 DIAGNOSIS — L03116 Cellulitis of left lower limb: Secondary | ICD-10-CM

## 2020-11-12 DIAGNOSIS — R778 Other specified abnormalities of plasma proteins: Secondary | ICD-10-CM | POA: Diagnosis not present

## 2020-11-12 DIAGNOSIS — E1122 Type 2 diabetes mellitus with diabetic chronic kidney disease: Secondary | ICD-10-CM | POA: Diagnosis not present

## 2020-11-12 DIAGNOSIS — L03119 Cellulitis of unspecified part of limb: Secondary | ICD-10-CM

## 2020-11-12 DIAGNOSIS — A419 Sepsis, unspecified organism: Secondary | ICD-10-CM | POA: Diagnosis not present

## 2020-11-12 DIAGNOSIS — I4891 Unspecified atrial fibrillation: Secondary | ICD-10-CM | POA: Diagnosis not present

## 2020-11-12 DIAGNOSIS — D638 Anemia in other chronic diseases classified elsewhere: Secondary | ICD-10-CM | POA: Diagnosis not present

## 2020-11-12 DIAGNOSIS — N184 Chronic kidney disease, stage 4 (severe): Secondary | ICD-10-CM

## 2020-11-12 DIAGNOSIS — M869 Osteomyelitis, unspecified: Secondary | ICD-10-CM | POA: Diagnosis not present

## 2020-11-12 DIAGNOSIS — M6510 Other infective (teno)synovitis, unspecified site: Secondary | ICD-10-CM

## 2020-11-12 LAB — BASIC METABOLIC PANEL
Anion gap: 14 (ref 5–15)
BUN: 76 mg/dL — ABNORMAL HIGH (ref 8–23)
CO2: 14 mmol/L — ABNORMAL LOW (ref 22–32)
Calcium: 8.7 mg/dL — ABNORMAL LOW (ref 8.9–10.3)
Chloride: 105 mmol/L (ref 98–111)
Creatinine, Ser: 3.88 mg/dL — ABNORMAL HIGH (ref 0.44–1.00)
GFR, Estimated: 12 mL/min — ABNORMAL LOW (ref >60)
Glucose, Bld: 131 mg/dL — ABNORMAL HIGH (ref 70–99)
Potassium: 4.5 mmol/L (ref 3.5–5.1)
Sodium: 133 mmol/L — ABNORMAL LOW (ref 135–145)

## 2020-11-12 LAB — GLUCOSE, CAPILLARY
Glucose-Capillary: 139 mg/dL — ABNORMAL HIGH (ref 70–99)
Glucose-Capillary: 148 mg/dL — ABNORMAL HIGH (ref 70–99)
Glucose-Capillary: 117 mg/dL — ABNORMAL HIGH (ref 70–99)
Glucose-Capillary: 120 mg/dL — ABNORMAL HIGH (ref 70–99)

## 2020-11-12 LAB — CBC
HCT: 21.9 % — ABNORMAL LOW (ref 36.0–46.0)
Hemoglobin: 7 g/dL — ABNORMAL LOW (ref 12.0–15.0)
MCH: 28 pg (ref 26.0–34.0)
MCHC: 32 g/dL (ref 30.0–36.0)
MCV: 87.6 fL (ref 80.0–100.0)
Platelets: 189 10*3/uL (ref 150–400)
RBC: 2.5 MIL/uL — ABNORMAL LOW (ref 3.87–5.11)
RDW: 15.1 % (ref 11.5–15.5)
WBC: 15.9 10*3/uL — ABNORMAL HIGH (ref 4.0–10.5)
nRBC: 0.1 % (ref 0.0–0.2)

## 2020-11-12 LAB — ABO/RH: ABO/RH(D): O POS

## 2020-11-12 LAB — VANCOMYCIN, RANDOM: Vancomycin Rm: 14

## 2020-11-12 LAB — PREPARE RBC (CROSSMATCH)

## 2020-11-12 MED ORDER — PIPERACILLIN-TAZOBACTAM IN DEX 2-0.25 GM/50ML IV SOLN
2.2500 g | Freq: Three times a day (TID) | INTRAVENOUS | Status: AC
  Administered 2020-11-13 (×2): 2.25 g via INTRAVENOUS
  Filled 2020-11-12 (×3): qty 50

## 2020-11-12 MED ORDER — IPRATROPIUM-ALBUTEROL 0.5-2.5 (3) MG/3ML IN SOLN
3.0000 mL | Freq: Four times a day (QID) | RESPIRATORY_TRACT | Status: DC
  Administered 2020-11-12 – 2020-11-15 (×10): 3 mL via RESPIRATORY_TRACT
  Filled 2020-11-12 (×12): qty 3

## 2020-11-12 MED ORDER — FUROSEMIDE 10 MG/ML IJ SOLN
80.0000 mg | Freq: Once | INTRAMUSCULAR | Status: AC
  Administered 2020-11-12: 80 mg via INTRAVENOUS
  Filled 2020-11-12: qty 8

## 2020-11-12 MED ORDER — SODIUM CHLORIDE 0.9% IV SOLUTION: Freq: Once | INTRAVENOUS | Status: AC

## 2020-11-12 MED ORDER — ACETAMINOPHEN 325 MG PO TABS
650.0000 mg | ORAL_TABLET | Freq: Once | ORAL | Status: DC
Start: 2020-11-12 — End: 2020-11-17
  Filled 2020-11-12: qty 2

## 2020-11-12 MED ORDER — BUDESONIDE 0.5 MG/2ML IN SUSP
0.5000 mg | Freq: Two times a day (BID) | RESPIRATORY_TRACT | Status: DC
  Administered 2020-11-12 – 2020-12-21 (×75): 0.5 mg via RESPIRATORY_TRACT
  Filled 2020-11-12 (×78): qty 2

## 2020-11-12 NOTE — Anesthesia Postprocedure Evaluation (Signed)
Anesthesia Post Note  Patient: [name]  Procedure(s) Performed: AMPUTATION RAY - 2nd metatarsal and 3rd metatarsal head (Left: Foot)  Patient location during evaluation: PACU Anesthesia Type: General Level of consciousness: awake and alert Pain management: pain level controlled Vital Signs Assessment: post-procedure vital signs reviewed and stable Respiratory status: spontaneous breathing, nonlabored ventilation, respiratory function stable and patient connected to nasal cannula oxygen Cardiovascular status: blood pressure returned to baseline and stable Postop Assessment: no apparent nausea or vomiting Anesthetic complications: no   No notable events documented.   Last Vitals:  Vitals:   11/12/20 0600 11/12/20 0738  BP: 119/78 130/76  Pulse: 98 70  Resp: 14 17  Temp: 36.8 C 36.8 C  SpO2: 92% 93%    Last Pain:  Vitals:   11/11/20 2311  TempSrc:   PainSc: Asleep                 [name]

## 2020-11-12 NOTE — Progress Notes (Signed)
   PODIATRY PROGRESS NOTE  NAME Autumn [name] MRN [id number] DOB 06-18-1950 DOA 11/09/2020   CC: Diabetic foot infection LT Chief Complaint  Patient presents with   Fall     History of present illness: 71 y.o. female patient s/p Incision and drainage. 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 LT. DOS: 11/11/2020.  Patient resting comfortably in bed.  Dressings intact with moderate strikethrough.  Patient seen at bedside today  Past Medical History:  Diagnosis Date   Diabetes mellitus without complication ([place])    Hypertension    Kidney stone    Thyroid disease     CBC Latest Ref Rng & Units 11/12/2020 11/11/2020 11/10/2020  WBC 4.0 - 10.5 K/uL 15.9(H) 15.6(H) 13.5(H)  Hemoglobin 12.0 - 15.0 g/dL 7.0(L) 7.4(L) 7.2(L)  Hematocrit 36.0 - 46.0 % 21.9(L) 22.6(L) 22.3(L)  Platelets 150 - 400 K/uL 189 185 180    BMP Latest Ref Rng & Units 11/12/2020 11/11/2020 11/10/2020  Glucose 70 - 99 mg/dL 131(H) 133(H) 152(H)  BUN 8 - 23 mg/dL 76(H) 72(H) 64(H)  Creatinine 0.44 - 1.00 mg/dL 3.88(H) 3.65(H) 3.50(H)  Sodium 135 - 145 mmol/L 133(L) 132(L) 132(L)  Potassium 3.5 - 5.1 mmol/L 4.5 4.0 3.5  Chloride 98 - 111 mmol/L 105 104 103  CO2 22 - 32 mmol/L 14(L) 18(L) 18(L)  Calcium 8.9 - 10.3 mg/dL 8.7(L) 8.6(L) 8.2(L)         Physical Exam: Please see above noted photo.  Sutures intact dorsal.  Packing throughout the amputation site left intact today.  No evidence of vascular compromise at the moment.  Minimal purulence.  No malodor.  Moderate serosanguineous drainage.    ASSESSMENT/PLAN OF CARE 1.  S/P second ray amputation. I&D LT foot. DOS: 11/11/2020 -Dressings changed. -Apparently the intraoperative findings indicated that there was dorsal tracking of the infection along the extensor tendons up into the ankle joint.  MRI left ankle recommended by surgeon.  Order placed today by Dr. Leslye Peer. Thank you.  -Recommend infectious disease consult.  Consult was placed today by Dr. Leslye Peer. Again thank  you.  -Patient may require return to the OR based on MRI findings of the ankle and the clinical appearance of the foot over the next 24-48 hours. -Continue cefepime and metronidazole as per standing order -Podiatry will continue to follow    Please contact me directly with any questions or concerns.  Cell [phone]   Edrick Kins, DPM [facility]  Dr. Edrick Kins, DPM    [address], [place] [address]                Office [phone]  Fax [phone]

## 2020-11-12 NOTE — Progress Notes (Signed)
Nutrition Follow-up  DOCUMENTATION CODES:   Obesity unspecified  INTERVENTION:   -Continue Ensure Max po daily, each supplement provides 150 kcal and 30 grams of protein.   -Continue 1 packet Juven BID, each packet provides 95 calories, 2.5 grams of protein (collagen), and 9.8 grams of carbohydrate (3 grams sugar); also contains 7 grams of L-arginine and L-glutamine, 300 mg vitamin C, 15 mg vitamin E, 1.2 mcg vitamin B-12, 9.5 mg zinc, 200 mg calcium, and 1.5 g  Calcium Beta-hydroxy-Beta-methylbutyrate to support wound healing  -Continue MVI with minerals daily  NUTRITION DIAGNOSIS:   Increased nutrient needs related to wound healing as evidenced by estimated needs.  Ongoing  GOAL:   Patient will meet greater than or equal to 90% of their needs  Progressing   MONITOR:   PO intake, Supplement acceptance, Labs, Weight trends, Skin, I & O's  REASON FOR ASSESSMENT:   Consult Wound healing  ASSESSMENT:   Patient is a 70 year old female who presents to the ER for evaluation of frequent falls and is found to be septic from left foot osteomyelitis and also in rapid A. fib.  10/31- s/p  Procedure(s): AMPUTATION RAY - 2nd, bone biopsy 3rd metatarsal head  Reviewed I/O's: +1.5 L x 24 hours and +3.3 L since admission  Pt resting quietly at time of visit. She did not respond to voice or touch.   No meal completions documented.   Medications reviewed and include cardizem and lactated ringers infusion @ 50 ml/hr.   Labs reviewed: CBGS: 139-145 (inpatient orders for glycemic control are 0-5 units insulin aspart daily at bedtime and 0-6 units insulin aspart TID with meals).    NUTRITION - FOCUSED PHYSICAL EXAM:  Flowsheet Row Most Recent Value  Orbital Region No depletion  Upper Arm Region No depletion  Thoracic and Lumbar Region No depletion  Buccal Region No depletion  Temple Region No depletion  Clavicle Bone Region No depletion  Clavicle and Acromion Bone Region No  depletion  Scapular Bone Region No depletion  Dorsal Hand No depletion  Patellar Region No depletion  Anterior Thigh Region No depletion  Posterior Calf Region No depletion  Edema (RD Assessment) Mild  Hair Reviewed  Eyes Reviewed  Mouth Reviewed  Skin Reviewed  Nails Reviewed       Diet Order:   Diet Order             Diet heart healthy/carb modified Room service appropriate? Yes; Fluid consistency: Thin  Diet effective now                   EDUCATION NEEDS:   No education needs have been identified at this time  Skin:  Skin Assessment: Skin Integrity Issues: Skin Integrity Issues:: Diabetic Ulcer Diabetic Ulcer: lt foot  Last BM:  Unknown  Height:   Ht Readings from Last 1 Encounters:  11/09/20 5\' 6"  (1.676 m)    Weight:   Wt Readings from Last 1 Encounters:  11/09/20 86.2 kg    Ideal Body Weight:  59.1 kg  BMI:  Body mass index is 30.67 kg/m.  Estimated Nutritional Needs:   Kcal:  5852-7782  Protein:  105-120 grams  Fluid:  > 1.7 L    Loistine Chance, RD, LDN, [place] Registered Dietitian II Certified Diabetes Care and Education Specialist Please refer to [facility] for RD and/or RD on-call/weekend/after hours pager

## 2020-11-12 NOTE — Consult Note (Addendum)
Pharmacy Antibiotic Note  Autumn [name] is a 70 y.o. female admitted on 11/09/2020 with  osteomyelitis .  Pharmacy has been consulted for Zosyn dosing. ID following.   Plan:  Zosyn 2.25 g IV q8h (CrCl < 20 mL/min)  Height: 5\' 6"  (167.6 cm) Weight: 86.2 kg (190 lb) IBW/kg (Calculated) : 59.3  Temp (24hrs), Avg:98.1 F (36.7 C), Min:97.7 F (36.5 C), Max:98.5 F (36.9 C)  Recent Labs  Lab 11/09/20 1220 11/09/20 1818 11/10/20 0649 11/10/20 1644 11/11/20 0636 11/11/20 2122 11/12/20 0540 11/12/20 1551  WBC 15.8*  --  13.5*  --  15.6*  --  15.9*  --   CREATININE 3.65*  --  3.50*  --  3.65*  --  3.88*  --   LATICACIDVEN 1.3 1.0  --   --   --   --   --   --   VANCORANDOM  --   --   --    < >  --  12  --  14   < > = values in this interval not displayed.     Estimated Creatinine Clearance: 15.4 mL/min (A) (by C-G formula based on SCr of 3.88 mg/dL (H)).    Allergies  Allergen Reactions   Codeine Nausea And Vomiting    Antimicrobials this admission: 10/29 Vancomycin >> 10/31 10/29 Cefepime >> 11/01 10/29 Metronidazole >> 11/01 11/02 Zosyn >>  Microbiology results: 10/30 MRSA PCR: (-) 10/31 L metatarsal tissue: NGTD 10/31 L foot abscess: NGTD 10/31 2nd metatarsal tissue: NGTD  Thank you for allowing pharmacy to be a part of this patient's care.  Benita Gutter  11/12/2020 10:04 PM

## 2020-11-12 NOTE — Consult Note (Signed)
NAME: Autumn [name]  DOB: 1950/09/07  MRN: [id number]  Date/Time: 11/12/2020 1:36 PM  REQUESTING PROVIDER: [email or web address] Subjective:  REASON FOR CONSULT: foot infection ? Autumn [name] is a 70 y.o.female  with a history of DM, CKD, liver cirrhosis secondary to [facility], hypertension, thyroid disease presents to the ED on 11/09/2020 with multiple falls.  Patient has a chronic left foot diabetic ulcer.and is followed at [facility].  Her family called EMS because she had multiple falls this week and hit her head on the ground twice.  The patient is not aware of how she fell.  When she arrived to the ED she had a large bruise on her right breast.  Vitals in the ED BP 117/58, temperature 99.4, pulse 128, respiratory 30 and sats 93%.   she was found to be rapid A. fib.  Labs in the ED showed sodium of 132, potassium 3.2, BUN 16, creatinine 3.65 baseline of 3.19.  AST was 35, BNP 360 troponin 147 lactate 1.3.  White count was 15.8, Hb 8 and platelet 190.  CT head no evidence of acute findings.  MRI of the left  foot showed severe soft tissue swelling around the second metatarsal neck concerning for phlegmon/developing abscess measuring 2.1 into 1 cm.  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 She was started on vanco/cefepime and flagyl She was taken for 2 nd ray excision on 11/11/20 During that surgery purulence was noted to track to the ankle joint along the extensor tendons. A biopsy was taken from the 3rd met head I am seeing the patient for infection management She has received oral antibiotics, augmentin and then keflex at [facility] for enterobacter aerogens culture positive in the foot ulcer in Aug 2022 Pt is intermittently hallucinating - She is totally oriented and then would say they are collecting money on the TV for a blood drive for her   Past Medical History:  Diagnosis Date   Diabetes mellitus without complication  ([place])    Hypertension    Kidney stone    Thyroid disease     Past Surgical History:  Procedure Laterality Date   ABDOMINAL HYSTERECTOMY     AMPUTATION Left 11/11/2020   Procedure: AMPUTATION RAY - 2nd metatarsal and 3rd metatarsal head;  Surgeon: [name], DPM;  Location: [facility] ORS;  Service: Podiatry;  Laterality: Left;   URETERAL STENT PLACEMENT      Social History   Socioeconomic History   Marital status: Single    Spouse name: Not on file   Number of children: Not on file   Years of education: Not on file   Highest education level: Not on file  Occupational History   Not on file  Tobacco Use   Smoking status: Former   Smokeless tobacco: Never  Substance and Sexual Activity   Alcohol use: Never   Drug use: Never   Sexual activity: Not on file  Other Topics Concern   Not on file  Social History Narrative   Not on file   Social Determinants of Health   Financial Resource Strain: Not on file  Food Insecurity: Not on file  Transportation Needs: Not on file  Physical Activity: Not on file  Stress: Not on file  Social Connections: Not on file  Intimate Partner Violence: Not on file    Family History  Problem Relation Age of Onset   Hypertension Mother    Allergies  Allergen Reactions  Codeine Nausea And Vomiting   I? Current Facility-Administered Medications  Medication Dose Route Frequency Provider Last Rate Last Admin   0.9 %  sodium chloride infusion (Manually program via Guardrails IV Fluids)   Intravenous Once Loletha Grayer, MD       0.9 %  sodium chloride infusion   Intravenous PRN Loletha Grayer, MD   Stopped at 11/11/20 0850   acetaminophen (TYLENOL) suppository 650 mg  650 mg Rectal Q6H PRN Agbata, Tochukwu, MD       acetaminophen (TYLENOL) tablet 650 mg  650 mg Oral Q6H PRN Skeet Latch, MD   650 mg at 11/12/20 1333   acetaminophen (TYLENOL) tablet 650 mg  650 mg Oral Once Loletha Grayer, MD       allopurinol (ZYLOPRIM) tablet 200  mg  200 mg Oral Daily Loletha Grayer, MD   200 mg at 11/12/20 1015   budesonide (PULMICORT) nebulizer solution 0.5 mg  0.5 mg Nebulization BID Loletha Grayer, MD       ceFEPIme (MAXIPIME) 2 g in sodium chloride 0.9 % 100 mL IVPB  2 g Intravenous Q24H Agbata, Tochukwu, MD 200 mL/hr at 11/11/20 1753 2 g at 11/11/20 1753   diltiazem (CARDIZEM) tablet 60 mg  60 mg Oral Q6H Dunn, Ryan M, PA-C   60 mg at 11/12/20 1247   ferrous gluconate (FERGON) tablet 324 mg  324 mg Oral Q breakfast Murlean Iba, MD   324 mg at 11/12/20 1015   furosemide (LASIX) injection 80 mg  80 mg Intravenous Once Loletha Grayer, MD       heparin ADULT infusion 100 units/mL (25000 units/250m)  1,800 Units/hr Intravenous Continuous CBenita Gutter RPH       insulin aspart (novoLOG) injection 0-5 Units  0-5 Units Subcutaneous QHS Wieting, Richard, MD       insulin aspart (novoLOG) injection 0-6 Units  0-6 Units Subcutaneous TID WC Wieting, Richard, MD   1 Units at 11/10/20 1728   ipratropium-albuterol (DUONEB) 0.5-2.5 (3) MG/3ML nebulizer solution 3 mL  3 mL Nebulization Q6H Wieting, Richard, MD       levothyroxine (SYNTHROID) tablet 100 mcg  100 mcg Oral Q0600 WLoletha Grayer MD   100 mcg at 11/12/20 0606   meclizine (ANTIVERT) tablet 12.5 mg  12.5 mg Oral TID PRN WLoletha Grayer MD       metoprolol tartrate (LOPRESSOR) tablet 25 mg  25 mg Oral BID WLoletha Grayer MD   25 mg at 11/12/20 1015   metroNIDAZOLE (FLAGYL) IVPB 500 mg  500 mg Intravenous Q8H Agbata, Tochukwu, MD 100 mL/hr at 11/12/20 0609 500 mg at 11/12/20 03295  multivitamin with minerals tablet 1 tablet  1 tablet Oral Daily WLoletha Grayer MD   1 tablet at 11/12/20 1017   nutrition supplement (JUVEN) (JUVEN) powder packet 1 packet  1 packet Oral BID BM Wieting, Richard, MD   1 packet at 11/12/20 1015   ondansetron (ZOFRAN) tablet 4 mg  4 mg Oral Q6H PRN Agbata, Tochukwu, MD       Or   ondansetron (ZOFRAN) injection 4 mg  4 mg Intravenous Q6H PRN  Agbata, Tochukwu, MD   4 mg at 11/10/20 1427   protein supplement (ENSURE MAX) liquid  11 oz Oral QHS Wieting, Richard, MD       vancomycin variable dose per unstable renal function (pharmacist dosing)   Does not apply See admin instructions Agbata, Tochukwu, MD       venlafaxine ([facility] tablet 100 mg  100 mg Oral  BID Loletha Grayer, MD   100 mg at 11/12/20 1015     Abtx:  Anti-infectives (From admission, onward)    Start     Dose/Rate Route Frequency Ordered Stop   11/11/20 1600  vancomycin (VANCOCIN) IVPB 1000 mg/200 mL premix  Status:  Discontinued        1,000 mg 200 mL/hr over 60 Minutes Intravenous Every 48 hours 11/09/20 1618 11/10/20 1045   11/11/20 1558  vancomycin (VANCOCIN) powder  Status:  Discontinued          As needed 11/11/20 1558 11/11/20 1558   11/10/20 2000  vancomycin (VANCOCIN) IVPB 1000 mg/200 mL premix  Status:  Discontinued        1,000 mg 200 mL/hr over 60 Minutes Intravenous Every 48 hours 11/10/20 1918 11/12/20 0940   11/09/20 1800  ceFEPIme (MAXIPIME) 2 g in sodium chloride 0.9 % 100 mL IVPB        2 g 200 mL/hr over 30 Minutes Intravenous Every 24 hours 11/09/20 1619     11/09/20 1619  vancomycin variable dose per unstable renal function (pharmacist dosing)         Does not apply See admin instructions 11/09/20 1619     11/09/20 1615  vancomycin (VANCOREADY) IVPB 1750 mg/350 mL        1,750 mg 175 mL/hr over 120 Minutes Intravenous  Once 11/09/20 1606 11/09/20 1947   11/09/20 1600  metroNIDAZOLE (FLAGYL) IVPB 500 mg        500 mg 100 mL/hr over 60 Minutes Intravenous Every 8 hours 11/09/20 1555 11/16/20 1330       REVIEW OF SYSTEMS:  Const: negative fever, negative chills, negative weight loss Eyes: negative diplopia or visual changes, negative eye pain ENT: negative coryza, negative sore throat Resp: negative cough, hemoptysis, dyspnea Cards: negative for chest pain, palpitations, lower extremity edema GU: negative for frequency, dysuria and  hematuria GI: Negative for abdominal pain, diarrhea, bleeding, constipation Skin: negative for rash and pruritus Heme:  bruising  MS: weakness Left foot ulcer Neurolo:falls, memory loss of the event Psych: negative for feelings of anxiety, depression  Endocrine:  thyroid, diabetes issues Allergy/Immunology- codeine Objective:  VITALS:  BP 123/80 (BP Location: Left Arm)   Pulse 88   Temp 98.3 F (36.8 C)   Resp 17   Ht '5\' 6"'  (1.676 m)   Wt 86.2 kg   SpO2 93%   BMI 30.67 kg/m  PHYSICAL EXAM:  General: Alert, cooperative, no distress, appears stated age.  Oriented in person and place and time but intermittently hallucinating Head: Normocephalic, without obvious abnormality, atraumatic. Eyes: Conjunctivae clear, anicteric sclerae. Pupils are equal.Exophthalmos ENT Nares normal. No drainage or sinus tenderness. Lips, mucosa, and tongue normal. No Thrush Neck: Supple, symmetrical, no adenopathy, thyroid: non tender no carotid bruit and no JVD. Back: did not examine Lungs: b/l air entry Heart: Regular rate and rhythm, no murmur, rub or gallop. Abdomen: Soft, non-tender,not distended. Bowel sounds normal. No masses Extremities: left foot dressing not removed Looked at pictures 11/12/20    Pre surgery   Skin: limited examination Lymph: Cervical, supraclavicular normal. Neurologic: Grossly non-focal Pertinent Labs Lab Results CBC    Component Value Date/Time   WBC 15.9 (H) 11/12/2020 0540   RBC 2.50 (L) 11/12/2020 0540   HGB 7.0 (L) 11/12/2020 0540   HCT 21.9 (L) 11/12/2020 0540   PLT 189 11/12/2020 0540   MCV 87.6 11/12/2020 0540   MCH 28.0 11/12/2020 0540   MCHC 32.0 11/12/2020 0540  RDW 15.1 11/12/2020 0540   LYMPHSABS 0.8 11/09/2020 1220   MONOABS 1.4 (H) 11/09/2020 1220   EOSABS 0.0 11/09/2020 1220   BASOSABS 0.0 11/09/2020 1220    CMP Latest Ref Rng & Units 11/12/2020 11/11/2020 11/10/2020  Glucose 70 - 99 mg/dL 131(H) 133(H) 152(H)  BUN 8 - 23 mg/dL  76(H) 72(H) 64(H)  Creatinine 0.44 - 1.00 mg/dL 3.88(H) 3.65(H) 3.50(H)  Sodium 135 - 145 mmol/L 133(L) 132(L) 132(L)  Potassium 3.5 - 5.1 mmol/L 4.5 4.0 3.5  Chloride 98 - 111 mmol/L 105 104 103  CO2 22 - 32 mmol/L 14(L) 18(L) 18(L)  Calcium 8.9 - 10.3 mg/dL 8.7(L) 8.6(L) 8.2(L)  Total Protein 6.5 - 8.1 g/dL - - -  Total Bilirubin 0.3 - 1.2 mg/dL - - -  Alkaline Phos 38 - 126 U/L - - -  AST 15 - 41 U/L - - -  ALT 0 - 44 U/L - - -      Microbiology: Recent Results (from the past 240 hour(s))  Resp Panel by RT-PCR (Flu A&B, Covid) Nasopharyngeal Swab     Status: None   Collection Time: 11/09/20  6:25 PM   Specimen: Nasopharyngeal Swab; Nasopharyngeal(NP) swabs in vial transport medium  Result Value Ref Range Status   SARS Coronavirus 2 by RT PCR NEGATIVE NEGATIVE Final    Comment: (NOTE) SARS-CoV-2 target nucleic acids are NOT DETECTED.  The SARS-CoV-2 RNA is generally detectable in upper respiratory specimens during the acute phase of infection. The lowest concentration of SARS-CoV-2 viral copies this assay can detect is 138 copies/mL. A negative result does not preclude SARS-Cov-2 infection and should not be used as the sole basis for treatment or other patient management decisions. A negative result may occur with  improper specimen collection/handling, submission of specimen other than nasopharyngeal swab, presence of viral mutation(s) within the areas targeted by this assay, and inadequate number of viral copies(<138 copies/mL). A negative result must be combined with clinical observations, patient history, and epidemiological information. The expected result is Negative.  Fact Sheet for Patients:  EntrepreneurPulse.com.au  Fact Sheet for Healthcare Providers:  IncredibleEmployment.be  This test is no t yet approved or cleared by the [place] FDA and  has been authorized for detection and/or diagnosis of SARS-CoV-2 by FDA  under an Emergency Use Authorization (EUA). This EUA will remain  in effect (meaning this test can be used) for the duration of the COVID-19 declaration under Section 564(b)(1) of the Act, 21 U.S.C.section 360bbb-3(b)(1), unless the authorization is terminated  or revoked sooner.       Influenza A by PCR NEGATIVE NEGATIVE Final   Influenza B by PCR NEGATIVE NEGATIVE Final    Comment: (NOTE) The Xpert Xpress SARS-CoV-2/FLU/RSV plus assay is intended as an aid in the diagnosis of influenza from Nasopharyngeal swab specimens and should not be used as a sole basis for treatment. Nasal washings and aspirates are unacceptable for Xpert Xpress SARS-CoV-2/FLU/RSV testing.  Fact Sheet for Patients: EntrepreneurPulse.com.au  Fact Sheet for Healthcare Providers: IncredibleEmployment.be  This test is not yet approved or cleared by the [place] FDA and has been authorized for detection and/or diagnosis of SARS-CoV-2 by FDA under an Emergency Use Authorization (EUA). This EUA will remain in effect (meaning this test can be used) for the duration of the COVID-19 declaration under Section 564(b)(1) of the Act, 21 U.S.C. section 360bbb-3(b)(1), unless the authorization is terminated or revoked.  Performed at [facility], [address] [place], [place] [address]  MRSA Next Gen by PCR, Nasal     Status: None   Collection Time: 11/10/20  1:14 AM   Specimen: Nasal Mucosa; Nasal Swab  Result Value Ref Range Status   MRSA by PCR Next Gen NOT DETECTED NOT DETECTED Final    Comment: (NOTE) The GeneXpert MRSA Assay (FDA approved for NASAL specimens only), is one component of a comprehensive MRSA colonization surveillance program. It is not intended to diagnose MRSA infection nor to guide or monitor treatment for MRSA infections. Test performance is not FDA approved in patients less than 18 years old. Performed at [facility], [address] [place], [place] [address]   Aerobic/Anaerobic Culture w Gram Stain (surgical/deep wound)     Status: None (Preliminary result)   Collection Time: 11/11/20  3:23 PM   Specimen: Foot, Left; Abscess  Result Value Ref Range Status   Specimen Description   Final    WOUND LEFT FOOT ABSCESS Performed at [facility], [address], [place], [place] [address]    Special Requests   Final    NONE Performed at [facility], [address] [place], [place] [address]    Gram Stain   Final    NO ORGANISMS SEEN SQUAMOUS EPITHELIAL CELLS PRESENT MODERATE WBC PRESENT, PREDOMINANTLY MONONUCLEAR MODERATE GRAM POSITIVE COCCI    Culture   Final    NO GROWTH < 24 HOURS Performed at [facility], [place] [address], [place], [place] [address]    Report Status PENDING  Incomplete  Aerobic/Anaerobic Culture w Gram Stain (surgical/deep wound)     Status: None (Preliminary result)   Collection Time: 11/11/20  3:32 PM   Specimen: Other Source; Tissue  Result Value Ref Range Status   Specimen Description   Final    WOUND 2ND METATARSAL Performed at [facility], [address], [place], [place] [address]    Special Requests   Final    NONE Performed at [facility], [address] [place], [place] [address]    Gram Stain   Final    NO ORGANISMS SEEN SQUAMOUS EPITHELIAL CELLS PRESENT FEW WBC PRESENT, PREDOMINANTLY MONONUCLEAR FEW GRAM POSITIVE COCCI    Culture   Final    NO GROWTH < 24 HOURS Performed at [facility], [place] [address] [place], [place] [address]    Report Status PENDING  Incomplete  Aerobic/Anaerobic Culture w Gram Stain (surgical/deep wound)     Status: None (Preliminary result)   Collection Time: 11/11/20  4:26 PM   Specimen: Other Source; Tissue  Result Value Ref Range Status   Specimen Description   Final    WOUND LEFT METATARSAL Performed at [facility], [address] [place] [address], [place], [place] [address]     Special Requests   Final    NONE Performed at [facility], [address] [place], [place] [address]    Gram Stain PENDING  Incomplete   Culture   Final    NO GROWTH < 24 HOURS Performed at [facility], [place] [address] [place], [place] [address]    Report Status PENDING  Incomplete    IMAGING RESULTS: MRI foot reviewed- as above I have personally reviewed the films ? 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

## 2020-11-12 NOTE — Op Note (Signed)
Patient Name: Autumn [name] DOB: 02/21/1950  MRN: [id number]   Date of Service: 11/09/2020 - 11/11/2020  Surgeon: Dr. Lanae Crumbly, DPM Assistants: None Pre-operative Diagnosis:  Left foot diabetic foot infection Osteomyelitis second toe and second metatarsal Post-operative Diagnosis:  Left foot diabetic foot infection Osteomyelitis second toe and second metatarsal Infectious tenosynovitis Procedures:  1) amputation ray second metatarsal and toe  2) incision and drainage deep abscess multiple fascial planes  3) bone biopsy open deep third metatarsal Pathology/Specimens: ID Type Source Tests Collected by Time Destination  1 : Left 3rd metatarsal head; bone for culture Tissue Other Source SURGICAL PATHOLOGY, AEROBIC/ANAEROBIC CULTURE W GRAM STAIN (SURGICAL/DEEP WOUND) [name], DPM 11/11/2020 1530   2 : Left 2nd metatarsal; for permanent  Amputation Toe, Left SURGICAL PATHOLOGY [name], DPM 11/11/2020 1541   A : Left foot abscess Abscess Foot, Left AEROBIC/ANAEROBIC CULTURE W GRAM STAIN (SURGICAL/DEEP WOUND) [name], DPM 11/11/2020 1528   B : Left 2nd metatarsal head; bone for culture Tissue Other Source AEROBIC/ANAEROBIC CULTURE W GRAM STAIN (SURGICAL/DEEP WOUND) [name], DPM 11/11/2020 1523    Anesthesia: Monitored anesthesia care with local Hemostasis:  Total Tourniquet Time Documented: Calf (Left) - 23 minutes Total: Calf (Left) - 23 minutes  Estimated Blood Loss: Less than 10 cc Materials: * No implants in log * Medications: 20 cc of 0.5% Marcaine and 1% lidocaine plain in a 33-82 mixture Complications: No complications noted  Indications for Procedure:  This is a 70 y.o. female with a history of type 2 diabetes and a chronic plantar left foot wound that she has been cared for at [facility].  She presented with cellulitis and a diabetic foot infection extending from the wound.  Procedure in Detail: Patient was identified in  pre-operative holding area. Formal consent was signed and the left lower extremity was marked. Patient was brought back to the operating room. Anesthesia was induced. The extremity was prepped and draped in the usual sterile fashion. Timeout was taken to confirm patient name, laterality, and procedure prior to incision.   Attention was then directed to the left foot where a linear incision was made over the second metatarsal encompassing the second toe and a racquet type incision.  This was carried deep through subcutaneous tissue and a significant mount of purulence was encountered.  Metatarsal head was fractured from the neck from chronic osteomyelitis, the bone appeared dead and unhealthy.  Carried dissection proximally to the TMT J and septic arthritis was noted of the joint here as well.  I made the decision to resect the entire metatarsal with the toe as well.  I then excised the plantar wound.  Purulence was still found tracking up along the extensor tendons and I explored this proximally up to the level of the ankle.  It was encased within the long extensor and tibialis anterior tendon sheaths and a significant mount was expressed.  The drainage was significant malodorous and a culture of it was taken. Once the abscess had been adequately explored in multiple fascial planes I irrigated it thoroughly with 3 L normal sterile saline with a pulse irrigator.  No more purulence was found.  I then carried my dissection laterally to the third metatarsal head and a biopsy with clean instrumentation and clean sterile gloves was taken of the metatarsal head for pathology and microbiology.  The second toe and metatarsal was sent for microbiology and pathology as well.  The dorsal incision was then loosely reapproximated with  3-0 nylon  The foot was then dressed with iodoform gauze packing, 4 x 4 gauze, Kerlix ABD pad and an Ace wrap. Patient tolerated the procedure well.   Disposition: Following a period of  post-operative monitoring, patient will be transferred back to the floor.

## 2020-11-12 NOTE — Progress Notes (Signed)
Progress Note  Patient Name: Autumn [name] Date of Encounter: 11/12/2020  [facility] Cardiologist: Marcille Blanco  Subjective   Patient complains of pain in the left foot following surgery yesterday.  No chest pain, shortness of breath, or palpitations.  Inpatient Medications    Scheduled Meds:  allopurinol  200 mg Oral Daily   diltiazem  60 mg Oral Q6H   ferrous gluconate  324 mg Oral Q breakfast   insulin aspart  0-5 Units Subcutaneous QHS   insulin aspart  0-6 Units Subcutaneous TID WC   levothyroxine  100 mcg Oral Q0600   metoprolol tartrate  25 mg Oral BID   multivitamin with minerals  1 tablet Oral Daily   nutrition supplement (JUVEN)  1 packet Oral BID BM   Ensure Max Protein  11 oz Oral QHS   vancomycin variable dose per unstable renal function (pharmacist dosing)   Does not apply See admin instructions   venlafaxine  100 mg Oral BID   Continuous Infusions:  sodium chloride Stopped (11/11/20 0850)   ceFEPime (MAXIPIME) IV 2 g (11/11/20 1753)   heparin     lactated ringers 50 mL/hr at 11/12/20 0204   metronidazole 500 mg (11/12/20 0609)   vancomycin 1,000 mg (11/10/20 2222)   PRN Meds: sodium chloride, [DISCONTINUED] acetaminophen **OR** acetaminophen, acetaminophen, albuterol, meclizine, ondansetron **OR** ondansetron (ZOFRAN) IV   Vital Signs    Vitals:   11/11/20 2003 11/11/20 2311 11/12/20 0600 11/12/20 0738  BP: 120/73 114/83 119/78 130/76  Pulse: 95 86 98 70  Resp: 16 16 14 17   Temp: 98.5 F (36.9 C) 98.5 F (36.9 C) 98.2 F (36.8 C) 98.2 F (36.8 C)  TempSrc:      SpO2: 93% 92% 92% 93%  Weight:      Height:        Intake/Output Summary (Last 24 hours) at 11/12/2020 0835 Last data filed at 11/12/2020 0800 Gross per 24 hour  Intake 1916.16 ml  Output 1000 ml  Net 916.16 ml   Last 3 Weights 11/09/2020 09/02/2018 03/30/2017  Weight (lbs) 190 lb 208 lb 215 lb  Weight (kg) 86.183 kg 94.348 kg 97.523 kg      Telemetry    Atrial  fibrillation with ventricular rates 60-100 bpm - Personally Reviewed  ECG    No new tracing  Physical Exam   GEN: No acute distress.   Neck: No JVD Cardiac: Irregularly irregular without murmurs, rubs, or gallops. Respiratory: Mildly diminished breath sounds throughout with poor inspiratory effort GI: Soft, nontender, non-distended  MS: Trace left distal calf edema with left foot and ankle wrapped. Neuro:  Nonfocal  Psych: Flat affect.  Labs    High Sensitivity Troponin:   Recent Labs  Lab 11/09/20 1220 11/09/20 1818 11/10/20 0115 11/10/20 0649  TROPONINIHS 147* 151* 162* 149*     Chemistry Recent Labs  Lab 11/09/20 1220 11/09/20 1818 11/10/20 0649 11/11/20 0636  NA 132*  --  132* 132*  K 3.2*  --  3.5 4.0  CL 102  --  103 104  CO2 18*  --  18* 18*  GLUCOSE 164*  --  152* 133*  BUN 69*  --  64* 72*  CREATININE 3.65*  --  3.50* 3.65*  CALCIUM 8.4*  --  8.2* 8.6*  MG  --  2.0  --   --   PROT 6.4*  --   --   --   ALBUMIN 2.7*  --   --   --  AST 35  --   --   --   ALT 25  --   --   --   ALKPHOS 84  --   --   --   BILITOT 0.4  --   --   --   GFRNONAA 13*  --  14* 13*  ANIONGAP 12  --  11 10    Lipids  Recent Labs  Lab 11/10/20 1147  CHOL 101  TRIG 149  HDL 21*  LDLCALC 50  CHOLHDL 4.8    Hematology Recent Labs  Lab 11/10/20 0649 11/11/20 0636 11/12/20 0540  WBC 13.5* 15.6* 15.9*  RBC 2.61* 2.62* 2.50*  HGB 7.2* 7.4* 7.0*  HCT 22.3* 22.6* 21.9*  MCV 85.4 86.3 87.6  MCH 27.6 28.2 28.0  MCHC 32.3 32.7 32.0  RDW 14.6 14.7 15.1  PLT 180 185 189   Thyroid  Recent Labs  Lab 11/09/20 1818  TSH 0.526    BNP Recent Labs  Lab 11/09/20 1220  BNP 360.1*    DDimer No results for input(s): DDIMER in the last 168 hours.   Radiology    No results found.  Cardiac Studies   TTE (11/10/2020):  1. Left ventricular ejection fraction, by estimation, is 55 to 60%. The  left ventricle has normal function. The left ventricle has no regional   wall motion abnormalities. There is mild concentric left ventricular  hypertrophy. Left ventricular diastolic  parameters are indeterminate. Elevated left ventricular Curtisha Bendix-diastolic  pressure.   2. Right ventricular systolic function is normal. The right ventricular  size is normal. There is normal pulmonary artery systolic pressure.   3. Left atrial size was severely dilated.   4. The mitral valve is normal in structure. Trivial mitral valve  regurgitation. No evidence of mitral stenosis.   5. The aortic valve is tricuspid. Aortic valve regurgitation is not  visualized. No aortic stenosis is present.   6. The inferior vena cava is normal in size with greater than 50%  respiratory variability, suggesting right atrial pressure of 3 mmHg.   7. There is a small patent foramen ovale.   Patient Profile     70 y.o. female with history of HFpEF, CKD stage IV-V, diabetes since her late 25s to early 51s with diabetic neuropathy and diabetic foot ulcer, HTN, anemia of chronic disease, hepatic cirrhosis, sleep apnea, and gout, admitted with left foot osteomyelitis complicated by possible syncope and newly diagnosed atrial fibrillation.  Assessment & Plan    Atrial fibrillation with rapid ventricular response: Ventricular rates are adequately controlled. -Continue diltiazem 60 mg every 6 hours (transition to once daily dosing prior to discharge) and metoprolol tartrate 25 mg twice daily. -Continue IV heparin with transition to NOAC prior to discharge as long as hemoglobin does not continue to drop and there is no sign of active bleeding. -Favor rate control over rhythm control strategy at this time given multiple comorbidities including severe anemia that may ultimately require interruption of anticoagulation.  Additionally, patient seems to be largely asymptomatic in regard to her atrial fibrillation.  Severe anemia: Acute on chronic with hemoglobin down to 7 this morning. -Consider PRBC  transfusion to maintain hemoglobin greater than 8.  Chronic kidney disease stage IV: Creatinine slightly uptrending yesterday, with repeat BMP pending today. -Maintain net even fluid balance, with consideration of PRBC transfusion as above.  May need diuretic if transfusion administered to minimize risk for TACO.  Elevated troponin: No angina reported.  Suspect this represents supply-demand mismatch. -Consider PRBC  transfusion to maintain hemoglobin greater than 8. -Consider outpatient Myoview to exclude high risk ischemia.  No plans for inpatient ischemia testing unless patient has ongoing signs/symptoms of ischemia.  Sepsis with left foot osteomyelitis: -Per internal medicine and podiatry.  For questions or updates, please contact [place] Please consult [email or web address] for contact info under [facility].      Signed, Nelva Bush, MD  11/12/2020, 8:35 AM

## 2020-11-12 NOTE — Progress Notes (Signed)
[facility]  ROUNDING NOTE   Subjective:   Patient seen resting in bed Alert but drowsy, awaiting breakfast tray No complaints of pain at this time Denies shortness of breath  Objective:  Vital signs in last 24 hours:  Temp:  [97.5 F (36.4 C)-98.5 F (36.9 C)] 98.3 F (36.8 C) (11/01 1118) Pulse Rate:  [68-98] 88 (11/01 1118) Resp:  [14-20] 17 (11/01 1118) BP: (95-130)/(54-90) 123/80 (11/01 1118) SpO2:  [88 %-99 %] 93 % (11/01 1118)  Weight change:  Filed Weights   11/09/20 1214  Weight: 86.2 kg    Intake/Output: I/O last 3 completed shifts: In: 2979.2 [P.O.:270; I.V.:2009.2; IV Piggyback:700] Out: 400 [Urine:400]   Intake/Output this shift:  Total I/O In: 120 [P.O.:120] Out: 1300 [Urine:1300]  Physical Exam: General: NAD, resting in bed, drowsy  Head: Normocephalic, atraumatic. Moist oral mucosal membranes  Eyes: Anicteric  Lungs:  Clear to auscultation, normal effort, [place] O2  Heart: Irregular rhythm  Abdomen:  Soft, nontender  Extremities:  no peripheral edema.  Surgical dressing on left foot  Neurologic: Moving all four extremities  Skin: No lesions or rashes       Basic Metabolic Panel: Recent Labs  Lab 11/09/20 1220 11/09/20 1818 11/10/20 0649 11/11/20 0636 11/12/20 0540  NA 132*  --  132* 132* 133*  K 3.2*  --  3.5 4.0 4.5  CL 102  --  103 104 105  CO2 18*  --  18* 18* 14*  GLUCOSE 164*  --  152* 133* 131*  BUN 69*  --  64* 72* 76*  CREATININE 3.65*  --  3.50* 3.65* 3.88*  CALCIUM 8.4*  --  8.2* 8.6* 8.7*  MG  --  2.0  --   --   --      Liver Function Tests: Recent Labs  Lab 11/09/20 1220  AST 35  ALT 25  ALKPHOS 84  BILITOT 0.4  PROT 6.4*  ALBUMIN 2.7*    No results for input(s): LIPASE, AMYLASE in the last 168 hours. No results for input(s): AMMONIA in the last 168 hours.  CBC: Recent Labs  Lab 11/09/20 1220 11/10/20 0649 11/11/20 0636 11/12/20 0540  WBC 15.8* 13.5* 15.6* 15.9*  NEUTROABS 13.5*  --   --    --   HGB 8.0* 7.2* 7.4* 7.0*  HCT 24.5* 22.3* 22.6* 21.9*  MCV 86.3 85.4 86.3 87.6  PLT 190 180 185 189     Cardiac Enzymes: Recent Labs  Lab 11/09/20 1818  CKTOTAL 184     BNP: Invalid input(s): POCBNP  CBG: Recent Labs  Lab 11/11/20 1241 11/11/20 1601 11/11/20 2147 11/12/20 0738 11/12/20 1117  GLUCAP 145* 121* 145* 139* 148*     Microbiology: Results for orders placed or performed during the hospital encounter of 11/09/20  Resp Panel by RT-PCR (Flu A&B, Covid) Nasopharyngeal Swab     Status: None   Collection Time: 11/09/20  6:25 PM   Specimen: Nasopharyngeal Swab; Nasopharyngeal(NP) swabs in vial transport medium  Result Value Ref Range Status   SARS Coronavirus 2 by RT PCR NEGATIVE NEGATIVE Final    Comment: (NOTE) SARS-CoV-2 target nucleic acids are NOT DETECTED.  The SARS-CoV-2 RNA is generally detectable in upper respiratory specimens during the acute phase of infection. The lowest concentration of SARS-CoV-2 viral copies this assay can detect is 138 copies/mL. A negative result does not preclude SARS-Cov-2 infection and should not be used as the sole basis for treatment or other patient management decisions. A negative  result may occur with  improper specimen collection/handling, submission of specimen other than nasopharyngeal swab, presence of viral mutation(s) within the areas targeted by this assay, and inadequate number of viral copies(<138 copies/mL). A negative result must be combined with clinical observations, patient history, and epidemiological information. The expected result is Negative.  Fact Sheet for Patients:  EntrepreneurPulse.com.au  Fact Sheet for Healthcare Providers:  IncredibleEmployment.be  This test is no t yet approved or cleared by the [place] FDA and  has been authorized for detection and/or diagnosis of SARS-CoV-2 by FDA under an Emergency Use Authorization (EUA). This EUA  will remain  in effect (meaning this test can be used) for the duration of the COVID-19 declaration under Section 564(b)(1) of the Act, 21 U.S.C.section 360bbb-3(b)(1), unless the authorization is terminated  or revoked sooner.       Influenza A by PCR NEGATIVE NEGATIVE Final   Influenza B by PCR NEGATIVE NEGATIVE Final    Comment: (NOTE) The Xpert Xpress SARS-CoV-2/FLU/RSV plus assay is intended as an aid in the diagnosis of influenza from Nasopharyngeal swab specimens and should not be used as a sole basis for treatment. Nasal washings and aspirates are unacceptable for Xpert Xpress SARS-CoV-2/FLU/RSV testing.  Fact Sheet for Patients: EntrepreneurPulse.com.au  Fact Sheet for Healthcare Providers: IncredibleEmployment.be  This test is not yet approved or cleared by the [place] FDA and has been authorized for detection and/or diagnosis of SARS-CoV-2 by FDA under an Emergency Use Authorization (EUA). This EUA will remain in effect (meaning this test can be used) for the duration of the COVID-19 declaration under Section 564(b)(1) of the Act, 21 U.S.C. section 360bbb-3(b)(1), unless the authorization is terminated or revoked.  Performed at [facility], [address] [place], [place] [address]   MRSA Next Gen by PCR, Nasal     Status: None   Collection Time: 11/10/20  1:14 AM   Specimen: Nasal Mucosa; Nasal Swab  Result Value Ref Range Status   MRSA by PCR Next Gen NOT DETECTED NOT DETECTED Final    Comment: (NOTE) The GeneXpert MRSA Assay (FDA approved for NASAL specimens only), is one component of a comprehensive MRSA colonization surveillance program. It is not intended to diagnose MRSA infection nor to guide or monitor treatment for MRSA infections. Test performance is not FDA approved in patients less than 42 years old. Performed at [facility], [address] [place], [place] [address]    Aerobic/Anaerobic Culture w Gram Stain (surgical/deep wound)     Status: None (Preliminary result)   Collection Time: 11/11/20  3:23 PM   Specimen: Foot, Left; Abscess  Result Value Ref Range Status   Specimen Description   Final    WOUND LEFT FOOT ABSCESS Performed at [facility], [address] [place], [place] [address]    Special Requests   Final    NONE Performed at [facility], [address] [place], [place] [address]    Gram Stain   Final    NO ORGANISMS SEEN SQUAMOUS EPITHELIAL CELLS PRESENT MODERATE WBC PRESENT, PREDOMINANTLY MONONUCLEAR MODERATE GRAM POSITIVE COCCI    Culture   Final    NO GROWTH < 24 HOURS Performed at [facility], [place] [address], [place], [place] [address]    Report Status PENDING  Incomplete  Aerobic/Anaerobic Culture w Gram Stain (surgical/deep wound)     Status: None (Preliminary result)   Collection Time: 11/11/20  3:32 PM   Specimen: Other Source; Tissue  Result  Value Ref Range Status   Specimen Description   Final    WOUND 2ND METATARSAL Performed at [facility], [address] [place], [place] [address]    Special Requests   Final    NONE Performed at [facility], [address] [place], [place] [address]    Gram Stain   Final    NO ORGANISMS SEEN SQUAMOUS EPITHELIAL CELLS PRESENT FEW WBC PRESENT, PREDOMINANTLY MONONUCLEAR FEW GRAM POSITIVE COCCI    Culture   Final    NO GROWTH < 24 HOURS Performed at [facility], [place] [address] [place], [place] [address]    Report Status PENDING  Incomplete  Aerobic/Anaerobic Culture w Gram Stain (surgical/deep wound)     Status: None (Preliminary result)   Collection Time: 11/11/20  4:26 PM   Specimen: Other Source; Tissue  Result Value Ref Range Status   Specimen Description   Final    WOUND LEFT METATARSAL Performed at [facility], [address] [place], [place] [address]    Special Requests   Final     NONE Performed at [facility], [address] [place], [place] [address]    Gram Stain PENDING  Incomplete   Culture   Final    NO GROWTH < 24 HOURS Performed at [facility], [place] [address], [place], [place] [address]    Report Status PENDING  Incomplete    Coagulation Studies: Recent Labs    11/09/20 1818  LABPROT 16.4*  INR 1.3*     Urinalysis: Recent Labs    11/10/20 0120  COLORURINE YELLOW*  LABSPEC 1.013  PHURINE 5.0  GLUCOSEU NEGATIVE  HGBUR NEGATIVE  BILIRUBINUR NEGATIVE  KETONESUR NEGATIVE  PROTEINUR 100*  NITRITE NEGATIVE  LEUKOCYTESUR NEGATIVE       Imaging: No results found.   Medications:    sodium chloride Stopped (11/11/20 0850)   ceFEPime (MAXIPIME) IV 2 g (11/11/20 1753)   heparin     lactated ringers 50 mL/hr at 11/12/20 0204   metronidazole 500 mg (11/12/20 0609)    allopurinol  200 mg Oral Daily   diltiazem  60 mg Oral Q6H   ferrous gluconate  324 mg Oral Q breakfast   insulin aspart  0-5 Units Subcutaneous QHS   insulin aspart  0-6 Units Subcutaneous TID WC   levothyroxine  100 mcg Oral Q0600   metoprolol tartrate  25 mg Oral BID   multivitamin with minerals  1 tablet Oral Daily   nutrition supplement (JUVEN)  1 packet Oral BID BM   Ensure Max Protein  11 oz Oral QHS   vancomycin variable dose per unstable renal function (pharmacist dosing)   Does not apply See admin instructions   venlafaxine  100 mg Oral BID   sodium chloride, [DISCONTINUED] acetaminophen **OR** acetaminophen, acetaminophen, albuterol, meclizine, ondansetron **OR** ondansetron (ZOFRAN) IV  Assessment/ Plan:  Autumn [name] is a 70 y.o.  female with medical problems of   DM, HTN   was admitted on 11/09/2020 fof Atrial fibrillation with rapid ventricular response ([place]) [I48.91] AKI (acute kidney injury) ([place]) [N17.9] Osteomyelitis of foot, left, acute ([place]) [K44.010] Atrial fibrillation with RVR ([place]) [I48.91] Worsening renal function  [N28.9] Syncope, unspecified syncope type [R55]   #  CKD st4/[facility] Baseline Creatinine 3.2/GFR 15 from sep 2022 CKD risk factors include - DM-2, HTN,  Home meds include lisinopril, simvastatin Electrolytes and Volume status are acceptable No acute indication for Dialysis at this time,  patient agreeable to initiation when needed. Avoid NSAIDs and iv contrast Continue supportive care and good control of BP and blood sugars Renal function appears stable at this time.  We will continue to monitor.    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  Glucose stable   # Hypokalemia Stable   # Osteomyelitis, left foot Partial ray amputation by podiatry on 11/11/2020.    #Anemia of CKD Hemoglobin 7.0, will continue to monitor.  Prefer to avoid blood transfusions at this time due to possible initiation of dialysis and transplant candidacy. Oral iron supplementation daily    LOS: 3   11/1/202212:50 PM

## 2020-11-12 NOTE — Progress Notes (Signed)
Per DR. Amin she will defer signing paperwork to give blood to hematology.

## 2020-11-12 NOTE — Progress Notes (Signed)
Patient ID: Autumn [name], female   DOB: [birth year], 70 y.o.   MRN: [id number] [facility] PROGRESS NOTE  Autumn [name] HYW:[id number] DOB: [birth year] DOA: 11/09/2020 PCP: Autumn Ohms, MD  HPI/Subjective: Patient postoperative day 1 for sepsis and left foot infection.  Little confused today.  Some cough and wheeze.  Some pain in the foot.  Objective: Vitals:   11/12/20 1412 11/12/20 1459  BP:  128/76  Pulse:  90  Resp:  17  Temp:  98.2 F (36.8 C)  SpO2: 91% 93%    Intake/Output Summary (Last 24 hours) at 11/12/2020 1620 Last data filed at 11/12/2020 1458 Gross per 24 hour  Intake 610 ml  Output 2300 ml  Net -1690 ml   Filed Weights   11/09/20 1214  Weight: 86.2 kg    ROS: Review of Systems  Respiratory:  Positive for cough and wheezing.   Cardiovascular:  Negative for chest pain.  Gastrointestinal:  Negative for abdominal pain, nausea and vomiting.  Musculoskeletal:  Positive for joint pain.  Exam: Physical Exam HENT:     Head: Normocephalic.     Mouth/Throat:     Pharynx: No oropharyngeal exudate.  Eyes:     General: Lids are normal.     Conjunctiva/sclera: Conjunctivae normal.  Cardiovascular:     Rate and Rhythm: Normal rate and regular rhythm.     Heart sounds: Normal heart sounds, S1 normal and S2 normal.  Pulmonary:     Breath sounds: Examination of the right-middle field reveals wheezing. Examination of the left-middle field reveals wheezing. Examination of the right-lower field reveals decreased breath sounds and wheezing. Examination of the left-lower field reveals decreased breath sounds and wheezing. Decreased breath sounds and wheezing present. No rhonchi or rales.  Abdominal:     Palpations: Abdomen is soft.     Tenderness: There is no abdominal tenderness.  Musculoskeletal:     Right lower leg: Swelling present.     Left lower leg: Swelling present.  Skin:    General: Skin is warm.     Comments: Left foot covered.  Neurological:      Mental Status: She is confused.      Scheduled Meds:  acetaminophen  650 mg Oral Once   allopurinol  200 mg Oral Daily   budesonide (PULMICORT) nebulizer solution  0.5 mg Nebulization BID   diltiazem  60 mg Oral Q6H   ferrous gluconate  324 mg Oral Q breakfast   insulin aspart  0-5 Units Subcutaneous QHS   insulin aspart  0-6 Units Subcutaneous TID WC   ipratropium-albuterol  3 mL Nebulization Q6H   levothyroxine  100 mcg Oral Q0600   metoprolol tartrate  25 mg Oral BID   multivitamin with minerals  1 tablet Oral Daily   nutrition supplement (JUVEN)  1 packet Oral BID BM   Ensure Max Protein  11 oz Oral QHS   venlafaxine  100 mg Oral BID   Continuous Infusions:  sodium chloride Stopped (11/11/20 0850)   ceFEPime (MAXIPIME) IV 2 g (11/11/20 1753)   heparin     metronidazole 500 mg (11/12/20 1353)   Brief history: 70 year old female with past medical history of type 2 diabetes mellitus, hypertension, hypothyroidism, Autumn [name] presents to the hospital with draining wounds on the bottom of her left foot.  MRI consistent with osteomyelitis and abscess.  Patient brought to the operating room on 11/11/2020 by Dr. Sherryle Lis.  Patient also has acute kidney injury atrial fibrillation with rapid ventricular response and  anemia of chronic disease.  Assessment/Plan:  Clinical sepsis, present on admission with left foot osteomyelitis and abscess.  Initially with tachycardia, tachypnea and leukocytosis.  Patient has acute kidney injury on chronic kidney disease stage IV.  MRI shows osteomyelitis of the third metatarsal head and second proximal flex and metatarsal head.  Patient brought to the operating room on 11/11/2020 for amputation of the second metatarsal and bone biopsy of the third metatarsal.  Concern for infection tracking up towards the ankle and recommended an MRI of the ankle.  Infectious disease consultation because patient will likely need IV antibiotics.  Patient on triple antibiotics  with vancomycin metronidazole and Maxipime. Acute kidney injury on chronic kidney disease stage IV.  Baseline creatinine as outpatient around 3.19.  Creatinine 3.88 today.  Hold IV fluids and give a unit of blood. Anemia of chronic disease with hemoglobin dropping down to 7.0.  We will transfuse 1 unit of packed red blood cells.  Benefits and risks of transfusion explained to the patient yesterday and again today. Atrial fibrillation with rapid ventricular response.  Initially was started on Cardizem drip.  Now on oral Cardizem.  Will restart heparin.  Patient may end up needing PICC line and another podiatry operation during the hospital course. Type 2 diabetes mellitus with chronic kidney disease stage IV.  Continue sliding scale insulin.  Last hemoglobin A1c good at 6.3.  Patient on Victoza as outpatient. Autumn [name] with liver cirrhosis Frequent falls.  PT once cleared by podiatry to do so. Elevated troponin likely demand ischemia from sepsis Hypothyroidism unspecified on levothyroxine Wheeze.  Start nebulizer treatments.  Chest x-ray showing possible pneumonia.  Already on antibiotics that would cover this.     Code Status:     Code Status Orders  (From admission, onward)           Start     Ordered   11/09/20 1556  Full code  Continuous        11/09/20 1555           Code Status History     This patient has a current code status but no historical code status.      Family Communication: Updated friend on the phone Disposition Plan: Status is: Inpatient  Consultants: [place] [facility] Nephrology  Procedures: 11/12/2019 amputation second ray metatarsal and toe, incision and drainage of multiple fascial planes of abscess, bone biopsy third metatarsal.  Antibiotics: Vancomycin, cefepime and Flagyl  Time spent: 29 minutes  [place]

## 2020-11-13 ENCOUNTER — Inpatient Hospital Stay: Payer: Medicare (Managed Care)

## 2020-11-13 DIAGNOSIS — I4891 Unspecified atrial fibrillation: Secondary | ICD-10-CM | POA: Diagnosis not present

## 2020-11-13 DIAGNOSIS — J9601 Acute respiratory failure with hypoxia: Secondary | ICD-10-CM

## 2020-11-13 DIAGNOSIS — N189 Chronic kidney disease, unspecified: Secondary | ICD-10-CM | POA: Diagnosis not present

## 2020-11-13 DIAGNOSIS — N179 Acute kidney failure, unspecified: Secondary | ICD-10-CM | POA: Diagnosis not present

## 2020-11-13 DIAGNOSIS — A419 Sepsis, unspecified organism: Secondary | ICD-10-CM | POA: Diagnosis not present

## 2020-11-13 DIAGNOSIS — I639 Cerebral infarction, unspecified: Secondary | ICD-10-CM

## 2020-11-13 DIAGNOSIS — R6520 Severe sepsis without septic shock: Secondary | ICD-10-CM | POA: Diagnosis not present

## 2020-11-13 DIAGNOSIS — M86172 Other acute osteomyelitis, left ankle and foot: Secondary | ICD-10-CM | POA: Diagnosis not present

## 2020-11-13 LAB — GLUCOSE, CAPILLARY
Glucose-Capillary: 114 mg/dL — ABNORMAL HIGH (ref 70–99)
Glucose-Capillary: 132 mg/dL — ABNORMAL HIGH (ref 70–99)
Glucose-Capillary: 126 mg/dL — ABNORMAL HIGH (ref 70–99)
Glucose-Capillary: 154 mg/dL — ABNORMAL HIGH (ref 70–99)
Glucose-Capillary: 175 mg/dL — ABNORMAL HIGH (ref 70–99)
Glucose-Capillary: 188 mg/dL — ABNORMAL HIGH (ref 70–99)
Glucose-Capillary: 197 mg/dL — ABNORMAL HIGH (ref 70–99)

## 2020-11-13 LAB — BPAM RBC
Blood Product Expiration Date: 202212022359
ISSUE DATE / TIME: 202211011652
Unit Type and Rh: 5100

## 2020-11-13 LAB — BASIC METABOLIC PANEL
Anion gap: 10 (ref 5–15)
BUN: 87 mg/dL — ABNORMAL HIGH (ref 8–23)
CO2: 18 mmol/L — ABNORMAL LOW (ref 22–32)
Calcium: 8.5 mg/dL — ABNORMAL LOW (ref 8.9–10.3)
Chloride: 104 mmol/L (ref 98–111)
Creatinine, Ser: 4.13 mg/dL — ABNORMAL HIGH (ref 0.44–1.00)
GFR, Estimated: 11 mL/min — ABNORMAL LOW (ref >60)
Glucose, Bld: 117 mg/dL — ABNORMAL HIGH (ref 70–99)
Potassium: 4.1 mmol/L (ref 3.5–5.1)
Sodium: 132 mmol/L — ABNORMAL LOW (ref 135–145)

## 2020-11-13 LAB — BLOOD GAS, ARTERIAL
Acid-base deficit: 9.2 mmol/L — ABNORMAL HIGH (ref 0.0–2.0)
Acid-base deficit: 11.4 mmol/L — ABNORMAL HIGH (ref 0.0–2.0)
Bicarbonate: 16.1 mmol/L — ABNORMAL LOW (ref 20.0–28.0)
Bicarbonate: 15.6 mmol/L — ABNORMAL LOW (ref 20.0–28.0)
FIO2: 0.28
FIO2: 0.4
MECHVT: 450 mL
Mechanical Rate: 14
O2 Saturation: 92.3 %
O2 Saturation: 91.2 %
PEEP: 5 cmH2O
Patient temperature: 37
Patient temperature: 37
pCO2 arterial: 32 mmHg (ref 32.0–48.0)
pCO2 arterial: 38 mmHg (ref 32.0–48.0)
pH, Arterial: 7.31 — ABNORMAL LOW (ref 7.350–7.450)
pH, Arterial: 7.22 — ABNORMAL LOW (ref 7.350–7.450)
pO2, Arterial: 71 mmHg — ABNORMAL LOW (ref 83.0–108.0)
pO2, Arterial: 74 mmHg — ABNORMAL LOW (ref 83.0–108.0)

## 2020-11-13 LAB — TYPE AND SCREEN
ABO/RH(D): O POS
Antibody Screen: NEGATIVE
Unit division: 0

## 2020-11-13 LAB — SURGICAL PATHOLOGY

## 2020-11-13 LAB — CBC
HCT: 26.7 % — ABNORMAL LOW (ref 36.0–46.0)
Hemoglobin: 8.5 g/dL — ABNORMAL LOW (ref 12.0–15.0)
MCH: 27.2 pg (ref 26.0–34.0)
MCHC: 31.8 g/dL (ref 30.0–36.0)
MCV: 85.3 fL (ref 80.0–100.0)
Platelets: 220 10*3/uL (ref 150–400)
RBC: 3.13 MIL/uL — ABNORMAL LOW (ref 3.87–5.11)
RDW: 15.7 % — ABNORMAL HIGH (ref 11.5–15.5)
WBC: 15.9 10*3/uL — ABNORMAL HIGH (ref 4.0–10.5)
nRBC: 0.2 % (ref 0.0–0.2)

## 2020-11-13 LAB — HEPARIN LEVEL (UNFRACTIONATED)
Heparin Unfractionated: 0.64 IU/mL (ref 0.30–0.70)
Heparin Unfractionated: <0.1 IU/mL — ABNORMAL LOW (ref 0.30–0.70)

## 2020-11-13 LAB — MRSA NEXT GEN BY PCR, NASAL: MRSA by PCR Next Gen: NOT DETECTED

## 2020-11-13 MED ORDER — ORAL CARE MOUTH RINSE
15.0000 mL | OROMUCOSAL | Status: DC
  Administered 2020-11-13 – 2020-11-14 (×7): 15 mL via OROMUCOSAL

## 2020-11-13 MED ORDER — METHYLPREDNISOLONE SODIUM SUCC 125 MG IJ SOLR
60.0000 mg | Freq: Once | INTRAMUSCULAR | Status: AC
  Administered 2020-11-13: 60 mg via INTRAVENOUS
  Filled 2020-11-13: qty 2

## 2020-11-13 MED ORDER — FUROSEMIDE 10 MG/ML IJ SOLN
INTRAMUSCULAR | Status: AC
  Administered 2020-11-13: 40 mg
  Filled 2020-11-13: qty 4

## 2020-11-13 MED ORDER — LORAZEPAM 2 MG/ML IJ SOLN
0.5000 mg | Freq: Once | INTRAMUSCULAR | Status: AC | PRN
  Administered 2020-11-13: 0.5 mg via INTRAVENOUS
  Filled 2020-11-13: qty 1

## 2020-11-13 MED ORDER — POLYETHYLENE GLYCOL 3350 17 G PO PACK
17.0000 g | PACK | Freq: Every day | ORAL | Status: DC
  Administered 2020-11-13 – 2020-11-24 (×7): 17 g
  Filled 2020-11-13 (×8): qty 1

## 2020-11-13 MED ORDER — CHLORHEXIDINE GLUCONATE 0.12% ORAL RINSE (MEDLINE KIT)
15.0000 mL | Freq: Two times a day (BID) | OROMUCOSAL | Status: DC
  Administered 2020-11-13 – 2020-11-14 (×2): 15 mL via OROMUCOSAL

## 2020-11-13 MED ORDER — ROCURONIUM BROMIDE 10 MG/ML (PF) SYRINGE
PREFILLED_SYRINGE | INTRAVENOUS | Status: AC
  Administered 2020-11-13: 50 mg via INTRAVENOUS
  Filled 2020-11-13: qty 10

## 2020-11-13 MED ORDER — FENTANYL CITRATE (PF) 100 MCG/2ML IJ SOLN: 100.0000 ug | Freq: Once | INTRAMUSCULAR | Status: AC

## 2020-11-13 MED ORDER — ETOMIDATE 2 MG/ML IV SOLN
INTRAVENOUS | Status: AC
  Administered 2020-11-13: 20 mg via INTRAVENOUS
  Filled 2020-11-13: qty 20

## 2020-11-13 MED ORDER — FUROSEMIDE 10 MG/ML IJ SOLN
40.0000 mg | Freq: Once | INTRAMUSCULAR | Status: AC
  Administered 2020-11-13: 40 mg via INTRAVENOUS

## 2020-11-13 MED ORDER — ETOMIDATE 2 MG/ML IV SOLN: 20.0000 mg | Freq: Once | INTRAVENOUS | Status: AC

## 2020-11-13 MED ORDER — DILTIAZEM HCL 30 MG PO TABS
60.0000 mg | ORAL_TABLET | Freq: Four times a day (QID) | ORAL | Status: DC
  Administered 2020-11-13 – 2020-11-21 (×26): 60 mg
  Filled 2020-11-13 (×26): qty 2

## 2020-11-13 MED ORDER — INSULIN ASPART 100 UNIT/ML IJ SOLN: 0.0000 [IU] | INTRAMUSCULAR | Status: DC

## 2020-11-13 MED ORDER — FENTANYL 2500MCG IN NS 250ML (10MCG/ML) PREMIX INFUSION
25.0000 ug/h | INTRAVENOUS | Status: DC
  Administered 2020-11-13: 25 ug/h via INTRAVENOUS
  Filled 2020-11-13 (×2): qty 250

## 2020-11-13 MED ORDER — PANTOPRAZOLE SODIUM 40 MG IV SOLR
40.0000 mg | Freq: Every day | INTRAVENOUS | Status: DC
  Administered 2020-11-13 – 2020-11-26 (×13): 40 mg via INTRAVENOUS
  Filled 2020-11-13 (×13): qty 40

## 2020-11-13 MED ORDER — PROPOFOL 1000 MG/100ML IV EMUL
0.0000 ug/kg/min | INTRAVENOUS | Status: DC
  Administered 2020-11-13: 15 ug/kg/min via INTRAVENOUS
  Administered 2020-11-13: 5 ug/kg/min via INTRAVENOUS
  Filled 2020-11-13 (×2): qty 100

## 2020-11-13 MED ORDER — FENTANYL BOLUS VIA INFUSION
25.0000 ug | INTRAVENOUS | Status: DC | PRN
  Filled 2020-11-13: qty 100

## 2020-11-13 MED ORDER — ROCURONIUM BROMIDE 10 MG/ML (PF) SYRINGE: 50.0000 mg | PREFILLED_SYRINGE | Freq: Once | INTRAVENOUS | Status: AC

## 2020-11-13 MED ORDER — FENTANYL CITRATE (PF) 100 MCG/2ML IJ SOLN
INTRAMUSCULAR | Status: AC
  Administered 2020-11-13: 100 ug via INTRAVENOUS
  Filled 2020-11-13: qty 2

## 2020-11-13 MED ORDER — CHLORHEXIDINE GLUCONATE CLOTH 2 % EX PADS
6.0000 | MEDICATED_PAD | Freq: Every day | CUTANEOUS | Status: DC
  Administered 2020-11-13 – 2020-11-22 (×10): 6 via TOPICAL

## 2020-11-13 MED ORDER — DOCUSATE SODIUM 50 MG/5ML PO LIQD
100.0000 mg | Freq: Two times a day (BID) | ORAL | Status: DC
  Administered 2020-11-13 – 2020-11-24 (×12): 100 mg
  Filled 2020-11-13 (×18): qty 10

## 2020-11-13 MED ORDER — FENTANYL CITRATE (PF) 100 MCG/2ML IJ SOLN
INTRAMUSCULAR | Status: AC
  Filled 2020-11-13: qty 2

## 2020-11-13 MED ORDER — PIPERACILLIN-TAZOBACTAM 3.375 G IVPB
3.3750 g | Freq: Two times a day (BID) | INTRAVENOUS | Status: DC
  Administered 2020-11-13: 3.375 g via INTRAVENOUS
  Filled 2020-11-13: qty 50

## 2020-11-13 MED ORDER — INSULIN ASPART 100 UNIT/ML IJ SOLN
0.0000 [IU] | INTRAMUSCULAR | Status: DC
  Administered 2020-11-13 – 2020-11-17 (×13): 1 [IU] via SUBCUTANEOUS
  Administered 2020-11-17: 2 [IU] via SUBCUTANEOUS
  Administered 2020-11-17: 1 [IU] via SUBCUTANEOUS
  Administered 2020-11-18: 2 [IU] via SUBCUTANEOUS
  Administered 2020-11-18 (×2): 1 [IU] via SUBCUTANEOUS
  Administered 2020-11-18: 2 [IU] via SUBCUTANEOUS
  Administered 2020-11-18 – 2020-11-19 (×3): 1 [IU] via SUBCUTANEOUS
  Administered 2020-11-19: 2 [IU] via SUBCUTANEOUS
  Administered 2020-11-19 – 2020-11-20 (×4): 1 [IU] via SUBCUTANEOUS
  Filled 2020-11-13 (×25): qty 1

## 2020-11-13 MED ORDER — IPRATROPIUM-ALBUTEROL 0.5-2.5 (3) MG/3ML IN SOLN
3.0000 mL | RESPIRATORY_TRACT | Status: DC | PRN
  Administered 2020-11-15: 3 mL via RESPIRATORY_TRACT

## 2020-11-13 NOTE — Progress Notes (Signed)
Date of Admission:  11/09/2020   T   ID: Autumn [name] is a 70 y.o. female  Principal Problem:   Sepsis ([place]) Active Problems:   Atrial fibrillation with RVR ([place])   Osteomyelitis of foot, left, acute ([place])   CKD stage 4 due to type 2 diabetes mellitus ([place])   Liver cirrhosis secondary to NASH (nonalcoholic steatohepatitis) ([place])   Hypertension   History of anemia due to CKD   Frequent falls   Acute kidney injury superimposed on CKD (H[facility])   Cellulitis and abscess of foot, except toes   Infectious tenosynovitis   Wheeze    Subjective:  altered mental status and resp distress early morning Transferred to ICU and has been intubated  Medications:   acetaminophen  650 mg Oral Once   allopurinol  200 mg Oral Daily   budesonide (PULMICORT) nebulizer solution  0.5 mg Nebulization BID   Chlorhexidine Gluconate Cloth  6 each Topical Daily   diltiazem  60 mg Per Tube Q6H   docusate  100 mg Per Tube BID   fentaNYL       ferrous gluconate  324 mg Oral Q breakfast   insulin aspart  0-5 Units Subcutaneous QHS   insulin aspart  0-6 Units Subcutaneous TID WC   ipratropium-albuterol  3 mL Nebulization Q6H   levothyroxine  100 mcg Oral Q0600   metoprolol tartrate  25 mg Oral BID   multivitamin with minerals  1 tablet Oral Daily   nutrition supplement (JUVEN)  1 packet Oral BID BM   pantoprazole (PROTONIX) IV  40 mg Intravenous Daily   polyethylene glycol  17 g Per Tube Daily   Ensure Max Protein  11 oz Oral QHS   venlafaxine  100 mg Oral BID    Objective: Vital signs in last 24 hours: Temp:  [97.7 F (36.5 C)-98.9 F (37.2 C)] 98.6 F (37 C) (11/02 0736) Pulse Rate:  [46-113] 53 (11/02 1330) Resp:  [13-21] 15 (11/02 1330) BP: (96-132)/(60-88) 96/60 (11/02 1330) SpO2:  [84 %-100 %] 96 % (11/02 1330) FiO2 (%):  [40 %] 40 % (11/02 1330)  PHYSICAL EXAM:  General: intubated Sedated ( fentanyl, propafol Head: Normocephalic, without obvious abnormality, atraumatic. Eyes:  Conjunctivae clear, anicteric sclerae. Pupils are equal Neck: Supple, symmetrical, no adenopathy, thyroid: non tender no carotid bruit and no JVD. Lungs: b/l air entry- decreased in the bases Heart: irregular. Abdomen: Soft, non-tender,not distended. Bowel sounds normal. No masses Extremities: left foot dressing     Skin: No rashes or lesions. Or bruising Lymph: Cervical, supraclavicular normal. Neurologic: cannot be assessed  Lab Results Recent Labs    11/12/20 0540 11/13/20 0203  WBC 15.9* 15.9*  HGB 7.0* 8.5*  HCT 21.9* 26.7*  NA 133* 132*  K 4.5 4.1  CL 105 104  CO2 14* 18*  BUN 76* 87*  CREATININE 3.88* 4.13*   Liver Panel No results for input(s): PROT, ALBUMIN, AST, ALT, ALKPHOS, BILITOT, BILIDIR, IBILI in the last 72 hours. Sedimentation Rate No results for input(s): ESRSEDRATE in the last 72 hours. C-Reactive Protein No results for input(s): CRP in the last 72 hours.  Microbiology: MRSA nares  not detected 11/13/20 BC Foot abscess culture Pasteurella, rare staph and strep mitis Studies/Results: CT HEAD WO CONTRAST (5MM)  Result Date: 11/13/2020 CLINICAL DATA:  Mental status changes EXAM: CT HEAD WITHOUT CONTRAST TECHNIQUE: Contiguous axial images were obtained from the base of the skull through the vertex without intravenous contrast. COMPARISON:  11/09/2020 FINDINGS: Brain:  stable atrophy pattern and chronic white matter microvascular ischemic changes throughout both cerebral hemispheres. Limited with some motion artifact. No acute intracranial hemorrhage, mass lesion, new infarction, midline shift, herniation, hydrocephalus, or extra-axial fluid collection. No focal mass effect or edema. Cisterns are patent. Cerebellar atrophy as well. Vascular: Intracranial atherosclerosis at the skull base. No hyperdense vessel. Skull: Normal. Negative for fracture or focal lesion. Sinuses/Orbits: No acute finding. Other: None. IMPRESSION: Stable atrophy and white matter  microvascular ischemic changes. No acute intracranial abnormality by noncontrast CT. Electronically Signed   By: Jerilynn Mages.  Shick M.D.   On: 11/13/2020 09:58   MR ANKLE LEFT WO CONTRAST  Result Date: 11/13/2020 CLINICAL DATA:  Suspected osteomyelitis of the ankle. Recent diagnosis of osteomyelitis involving the second metatarsal and proximal phalanx. EXAM: MRI OF THE LEFT ANKLE WITHOUT CONTRAST TECHNIQUE: Multiplanar, multisequence MR imaging of the ankle was performed. No intravenous contrast was administered. COMPARISON:  Radiographs and MRI of 11/09/2020 FINDINGS: The patient had great difficulty remaining stationary for today's exam. Only a single diagnostic series could be obtained. Osseous structures: Interval resection of the second metatarsal on 11/11/2020, with expected regional postoperative findings. It is difficult on the single series provided to get a good sense of the base of the third metatarsal and its articulation with the lateral cuneiform, there is potentially some accentuated fluid signal and irregularity the articulation of the lateral cuneiform and the base of the third metatarsal. No discrete edema signal to suggest osteomyelitis involving the distal tibia/fibula, calcaneus, cuboid, talus, navicular, or medial cuneiform. The middle and lateral cuneiforms are somewhat irregular and difficult to fully characterize on this single series. Much of this irregularity is probably simply from arthropathy along the Lisfranc joint and postoperative findings. Ligamentous structures: Limited assessment due to the lack of images, no overt discontinuity along the lateral ligamentous complex or deltoid ligament. Musculotendinous: Extensor digitorum tenosynovitis. Common peroneus tendon sheath tenosynovitis noted with suspected longitudinal tearing of the peroneus brevis. Distal tibialis posterior tenosynovitis and tendinopathy. Achilles tendon intact. Plantar fascia poorly assessed. Soft tissues: Extensive  subcutaneous edema circumferentially in the distal calf, mitigating somewhat in the ankle region. Dorsal subcutaneous edema in the foot. IMPRESSION: 1. Limited assessment as the patient could only tolerate a single diagnostic series to be performed. This is a small subset of the normal MRI ankle exam. Reduced diagnostic sensitivity and specificity. 2. Interval resection of the second metatarsal. Possible arthropathy between the lateral cuneiform and the base of the third metatarsal. No definite osteomyelitis in the ankle region. 3. Longitudinal tear of the peroneus brevis with peroneus tenosynovitis. 4. Distal tibialis posterior tenosynovitis and tendinopathy. There is also extensor digitorum tenosynovitis. 5. Extensive subcutaneous edema circumferentially in the distal calf. Dorsal subcutaneous edema in the foot. Electronically Signed   By: Van Clines M.D.   On: 11/13/2020 07:33   DG Chest Port 1 View  Result Date: 11/13/2020 CLINICAL DATA:  Intubation EXAM: PORTABLE CHEST 1 VIEW COMPARISON:  Portable exam 1046 hours compared to 0907 hours FINDINGS: Tip of endotracheal tube is at carina directed toward RIGHT mainstem bronchus, recommend withdrawal 2-3 cm. Enlargement of cardiac silhouette with vascular congestion. RIGHT lung infiltrate slightly increased from previous exam. Mild RIGHT basilar atelectasis. No pleural effusion or pneumothorax. IMPRESSION: Tip of endotracheal tube is at carina, recommend withdrawal 2-3 cm. Enlargement of cardiac silhouette with pulmonary vascular congestion and increased RIGHT lung infiltrate. Findings called to patient's nurse Sarah RN in CCU on 11/13/2020 at 1110 hours. Electronically Signed   By: Elta Guadeloupe  Thornton Papas M.D.   On: 11/13/2020 11:21   DG Chest Port 1 View  Result Date: 11/13/2020 CLINICAL DATA:  Respiratory difficulty EXAM: PORTABLE CHEST 1 VIEW COMPARISON:  11/12/2020 FINDINGS: Heart size within normal limits. No pulmonary vascular congestion. Airspace opacity  in the right lower lung likely due to pneumonitis. Lungs otherwise clear. No pneumothorax. IMPRESSION: Right basilar airspace opacity likely due to pneumonitis. Electronically Signed   By: Miachel Roux M.D.   On: 11/13/2020 09:15   DG Chest Port 1 View  Result Date: 11/12/2020 CLINICAL DATA:  Wheezing EXAM: PORTABLE CHEST 1 VIEW COMPARISON:  Chest x-ray 11/09/2020 FINDINGS: The heart and mediastinal contours are within normal limits. Patchy right lung airspace and interstitial opacity. No pulmonary edema. No pleural effusion. No pneumothorax. No acute osseous abnormality. IMPRESSION: Patchy right lung airspace and interstitial airspace opacity that may represent infection/inflammation. Electronically Signed   By: Iven Finn M.D.   On: 11/12/2020 15:05   DG Abd Portable 1V  Result Date: 11/13/2020 CLINICAL DATA:  Enteric tube placement EXAM: PORTABLE ABDOMEN - 1 VIEW COMPARISON:  None. FINDINGS: Tip of enteric tube is seen in the region of body of stomach. Bowel gas pattern is nonspecific. Arterial calcifications are seen. Right margin of abdominal aorta is projecting to the right of lumbar spine. This may be due to tortuosity or aneurysmal dilation of abdominal aorta. Degenerative changes are noted with disc space narrowing, bony spurs and facet hypertrophy in the lumbar spine. IMPRESSION: Tip and side port of enteric tube are noted within the stomach. Nonspecific bowel gas pattern. Calcification in the abdominal aorta is projecting slightly to the right of lumbar spine which may be due to tortuosity or aneurysmal dilation. Electronically Signed   By: Elmer Picker M.D.   On: 11/13/2020 11:03     Assessment/Plan:  Resp distress - intubated Altered mental status due to CVA MRI shows Acute to early subacute infarction in the left posterior frontal cortical and subcortical brain, possibly affecting the precentral gyrus.  Diabetes mellitus with Left foot ulcer with infection and  sepsis ?chronic ulcer but it is infected now- underwent I/D and 2nd ray excision polymicrobial On zosyn    Falls- unclear etiology   Severe anemia- got blood transfusion  AKI on CKD- worsening Avoid nephrotoxic drugs   DM    NASH leading to cirrhosis Rt heart failure   Hypothyroidism on synthroid-     Discussed the management with care team

## 2020-11-13 NOTE — Progress Notes (Signed)
Patient transported to MRI with Oneita Jolly, Liborio Nixon, CPT, and MRI technician.

## 2020-11-13 NOTE — Progress Notes (Signed)
Eeg done 

## 2020-11-13 NOTE — Consult Note (Signed)
Pharmacy Antibiotic Note  Autumn [name] is a 70 y.o. female admitted on 11/09/2020 with  osteomyelitis .  Pharmacy has been consulted for Zosyn dosing. ID following.   Plan: Pt is not on HD. Will change to pip/tazo 3.375 g q12H extended infusion.   Height: 5\' 6"  (167.6 cm) Weight: 86.2 kg (190 lb) IBW/kg (Calculated) : 59.3  Temp (24hrs), Avg:98.2 F (36.8 C), Min:97.7 F (36.5 C), Max:98.9 F (37.2 C)  Recent Labs  Lab 11/09/20 1220 11/09/20 1818 11/10/20 0649 11/10/20 1644 11/11/20 0636 11/11/20 2122 11/12/20 0540 11/12/20 1551 11/13/20 0203  WBC 15.8*  --  13.5*  --  15.6*  --  15.9*  --  15.9*  CREATININE 3.65*  --  3.50*  --  3.65*  --  3.88*  --  4.13*  LATICACIDVEN 1.3 1.0  --   --   --   --   --   --   --   VANCORANDOM  --   --   --    < >  --  12  --  14  --    < > = values in this interval not displayed.     Estimated Creatinine Clearance: 14.4 mL/min (A) (by C-G formula based on SCr of 4.13 mg/dL (H)).    Allergies  Allergen Reactions   Codeine Nausea And Vomiting    Antimicrobials this admission: 10/29 Vancomycin >> 10/31 10/29 Cefepime >> 11/01 10/29 Metronidazole >> 11/01 11/02 Zosyn >>  Microbiology results: 10/30 MRSA PCR: (-) 10/31 L metatarsal tissue: NGTD 10/31 L foot abscess: NGTD 10/31 2nd metatarsal tissue: NGTD  Thank you for allowing pharmacy to be a part of this patient's care.  Oswald Hillock  11/13/2020 8:03 AM

## 2020-11-13 NOTE — Progress Notes (Signed)
Progress Note  Patient Name: Autumn [name] Date of Encounter: 11/13/2020  [facility] Cardiologist: Dr. Saunders Revel  Subjective   Patient is somewhat confused, says she is doing well since the surgery. Afib rates controlled. Denies chest pain or SOB. Overnight O2 dropped and she was placed on supplemental O2.   Inpatient Medications    Scheduled Meds:  acetaminophen  650 mg Oral Once   allopurinol  200 mg Oral Daily   budesonide (PULMICORT) nebulizer solution  0.5 mg Nebulization BID   diltiazem  60 mg Oral Q6H   ferrous gluconate  324 mg Oral Q breakfast   insulin aspart  0-5 Units Subcutaneous QHS   insulin aspart  0-6 Units Subcutaneous TID WC   ipratropium-albuterol  3 mL Nebulization Q6H   levothyroxine  100 mcg Oral Q0600   metoprolol tartrate  25 mg Oral BID   multivitamin with minerals  1 tablet Oral Daily   nutrition supplement (JUVEN)  1 packet Oral BID BM   Ensure Max Protein  11 oz Oral QHS   venlafaxine  100 mg Oral BID   Continuous Infusions:  sodium chloride Stopped (11/11/20 0850)   heparin 1,800 Units/hr (11/13/20 0156)   piperacillin-tazobactam (ZOSYN)  IV 2.25 g (11/13/20 0600)   PRN Meds: sodium chloride, [DISCONTINUED] acetaminophen **OR** acetaminophen, acetaminophen, meclizine, ondansetron **OR** ondansetron (ZOFRAN) IV   Vital Signs    Vitals:   11/13/20 0458 11/13/20 0729 11/13/20 0736 11/13/20 0742  BP: 125/76  132/71   Pulse: 93  78   Resp: (!) 21  20   Temp: 98.9 F (37.2 C)  98.6 F (37 C)   TempSrc:   Oral   SpO2: 90% (!) 84% 95% 93%  Weight:      Height:        Intake/Output Summary (Last 24 hours) at 11/13/2020 0744 Last data filed at 11/13/2020 0515 Gross per 24 hour  Intake 1492 ml  Output 3375 ml  Net -1883 ml   Last 3 Weights 11/09/2020 09/02/2018 03/30/2017  Weight (lbs) 190 lb 208 lb 215 lb  Weight (kg) 86.183 kg 94.348 kg 97.523 kg      Telemetry    Afib HR 70-80s - Personally Reviewed  ECG    No new -  Personally Reviewed  Physical Exam   GEN: No acute distress.   Neck: No JVD Cardiac: Irreg Irreg, no murmurs, rubs, or gallops.  Respiratory: wheezing GI: Soft, nontender, non-distended  MS: No edema; No deformity. Neuro:  Nonfocal  Psych: Normal affect   Labs    High Sensitivity Troponin:   Recent Labs  Lab 11/09/20 1220 11/09/20 1818 11/10/20 0115 11/10/20 0649  TROPONINIHS 147* 151* 162* 149*     Chemistry Recent Labs  Lab 11/09/20 1220 11/09/20 1818 11/10/20 0649 11/11/20 0636 11/12/20 0540 11/13/20 0203  NA 132*  --    < > 132* 133* 132*  K 3.2*  --    < > 4.0 4.5 4.1  CL 102  --    < > 104 105 104  CO2 18*  --    < > 18* 14* 18*  GLUCOSE 164*  --    < > 133* 131* 117*  BUN 69*  --    < > 72* 76* 87*  CREATININE 3.65*  --    < > 3.65* 3.88* 4.13*  CALCIUM 8.4*  --    < > 8.6* 8.7* 8.5*  MG  --  2.0  --   --   --   --  PROT 6.4*  --   --   --   --   --   ALBUMIN 2.7*  --   --   --   --   --   AST 35  --   --   --   --   --   ALT 25  --   --   --   --   --   ALKPHOS 84  --   --   --   --   --   BILITOT 0.4  --   --   --   --   --   GFRNONAA 13*  --    < > 13* 12* 11*  ANIONGAP 12  --    < > 10 14 10    < > = values in this interval not displayed.    Lipids  Recent Labs  Lab 11/10/20 1147  CHOL 101  TRIG 149  HDL 21*  LDLCALC 50  CHOLHDL 4.8    Hematology Recent Labs  Lab 11/11/20 0636 11/12/20 0540 11/13/20 0203  WBC 15.6* 15.9* 15.9*  RBC 2.62* 2.50* 3.13*  HGB 7.4* 7.0* 8.5*  HCT 22.6* 21.9* 26.7*  MCV 86.3 87.6 85.3  MCH 28.2 28.0 27.2  MCHC 32.7 32.0 31.8  RDW 14.7 15.1 15.7*  PLT 185 189 220   Thyroid  Recent Labs  Lab 11/09/20 1818  TSH 0.526    BNP Recent Labs  Lab 11/09/20 1220  BNP 360.1*    DDimer No results for input(s): DDIMER in the last 168 hours.   Radiology    MR ANKLE LEFT WO CONTRAST  Result Date: 11/13/2020 CLINICAL DATA:  Suspected osteomyelitis of the ankle. Recent diagnosis of osteomyelitis  involving the second metatarsal and proximal phalanx. EXAM: MRI OF THE LEFT ANKLE WITHOUT CONTRAST TECHNIQUE: Multiplanar, multisequence MR imaging of the ankle was performed. No intravenous contrast was administered. COMPARISON:  Radiographs and MRI of 11/09/2020 FINDINGS: The patient had great difficulty remaining stationary for today's exam. Only a single diagnostic series could be obtained. Osseous structures: Interval resection of the second metatarsal on 11/11/2020, with expected regional postoperative findings. It is difficult on the single series provided to get a good sense of the base of the third metatarsal and its articulation with the lateral cuneiform, there is potentially some accentuated fluid signal and irregularity the articulation of the lateral cuneiform and the base of the third metatarsal. No discrete edema signal to suggest osteomyelitis involving the distal tibia/fibula, calcaneus, cuboid, talus, navicular, or medial cuneiform. The middle and lateral cuneiforms are somewhat irregular and difficult to fully characterize on this single series. Much of this irregularity is probably simply from arthropathy along the Lisfranc joint and postoperative findings. Ligamentous structures: Limited assessment due to the lack of images, no overt discontinuity along the lateral ligamentous complex or deltoid ligament. Musculotendinous: Extensor digitorum tenosynovitis. Common peroneus tendon sheath tenosynovitis noted with suspected longitudinal tearing of the peroneus brevis. Distal tibialis posterior tenosynovitis and tendinopathy. Achilles tendon intact. Plantar fascia poorly assessed. Soft tissues: Extensive subcutaneous edema circumferentially in the distal calf, mitigating somewhat in the ankle region. Dorsal subcutaneous edema in the foot. IMPRESSION: 1. Limited assessment as the patient could only tolerate a single diagnostic series to be performed. This is a small subset of the normal MRI ankle  exam. Reduced diagnostic sensitivity and specificity. 2. Interval resection of the second metatarsal. Possible arthropathy between the lateral cuneiform and the base of the third metatarsal. No definite osteomyelitis in  the ankle region. 3. Longitudinal tear of the peroneus brevis with peroneus tenosynovitis. 4. Distal tibialis posterior tenosynovitis and tendinopathy. There is also extensor digitorum tenosynovitis. 5. Extensive subcutaneous edema circumferentially in the distal calf. Dorsal subcutaneous edema in the foot. Electronically Signed   By: Van Clines M.D.   On: 11/13/2020 07:33   DG Chest Port 1 View  Result Date: 11/12/2020 CLINICAL DATA:  Wheezing EXAM: PORTABLE CHEST 1 VIEW COMPARISON:  Chest x-ray 11/09/2020 FINDINGS: The heart and mediastinal contours are within normal limits. Patchy right lung airspace and interstitial opacity. No pulmonary edema. No pleural effusion. No pneumothorax. No acute osseous abnormality. IMPRESSION: Patchy right lung airspace and interstitial airspace opacity that may represent infection/inflammation. Electronically Signed   By: Iven Finn M.D.   On: 11/12/2020 15:05    Cardiac Studies   TTE (11/10/2020):  1. Left ventricular ejection fraction, by estimation, is 55 to 60%. The  left ventricle has normal function. The left ventricle has no regional  wall motion abnormalities. There is mild concentric left ventricular  hypertrophy. Left ventricular diastolic  parameters are indeterminate. Elevated left ventricular end-diastolic  pressure.   2. Right ventricular systolic function is normal. The right ventricular  size is normal. There is normal pulmonary artery systolic pressure.   3. Left atrial size was severely dilated.   4. The mitral valve is normal in structure. Trivial mitral valve  regurgitation. No evidence of mitral stenosis.   5. The aortic valve is tricuspid. Aortic valve regurgitation is not  visualized. No aortic stenosis is  present.   6. The inferior vena cava is normal in size with greater than 50%  respiratory variability, suggesting right atrial pressure of 3 mmHg.   7. There is a small patent foramen ovale  Patient Profile     70 y.o. female with h/o HFpEF, CKD stage 4-5, diabetes with diabetic neuropathy, diabetic foot ulcer, HTN, anemia of chronic disease, sleep apnea, gout admitted with left foot osteomyelitis complicated by possible syncope and new afib.   Assessment & Plan    Afib with RVR - rates controlled with diltiazem 60mg  Q6H and metoprolol tartrate 25mg  BID - IV heparin with transition to NOAC eventually, monitor H&H - Echo showed LVEF 55-60%, severely dilated LA - Patient is asymptomatic - Plan to continue to rate control in the setting of multiple complicated issues.   Severe anemia s/p 1 unit PRBCs - Hgb today 8.3 - monitor with IV heparin  CKD stage IV - Scr up today, 3.88>4.13 - Ace and statin held - nephrology following  Elevated troponin - suspect demand ischemia in the setting of multiple complicated issues - can consider Outpatient MPI  Sepsis with left foot osteomyelitis - abx per IM   For questions or updates, please contact [facility] Please consult [email or web address] for contact info under        Signed, Jakobi Thetford Ninfa Meeker, PA-C  11/13/2020, 7:44 AM

## 2020-11-13 NOTE — Progress Notes (Addendum)
ET withdrew from 24cm to 22cm at the lip, per NP request.

## 2020-11-13 NOTE — Consult Note (Signed)
ANTICOAGULATION CONSULT NOTE   Pharmacy Consult for Heparin gtt Indication: atrial fibrillation  Allergies  Allergen Reactions   Codeine Nausea And Vomiting    Patient Measurements: Height: 5\' 6"  (167.6 cm) Weight: 86.2 kg (190 lb) IBW/kg (Calculated) : 59.3 Heparin Dosing Weight: 77.8 kg  Vital Signs: Temp: 98.6 F (37 C) (11/02 0736) Temp Source: Oral (11/02 0736) BP: 124/66 (11/02 0901) Pulse Rate: 76 (11/02 0901)  Labs: Recent Labs    11/10/20 2059 11/11/20 0636 11/11/20 0636 11/11/20 0704 11/12/20 0540 11/13/20 0203 11/13/20 0855  HGB  --  7.4*   < >  --  7.0* 8.5*  --   HCT  --  22.6*  --   --  21.9* 26.7*  --   PLT  --  185  --   --  189 220  --   HEPARINUNFRC 0.18*  --   --  0.43  --   --  0.64  CREATININE  --  3.65*  --   --  3.88* 4.13*  --    < > = values in this interval not displayed.     Estimated Creatinine Clearance: 14.4 mL/min (A) (by C-G formula based on SCr of 4.13 mg/dL (H)).   Medical History: Past Medical History:  Diagnosis Date   Diabetes mellitus without complication ([place])    Hypertension    Kidney stone    Thyroid disease     Medications:  Scheduled:   acetaminophen  650 mg Oral Once   allopurinol  200 mg Oral Daily   budesonide (PULMICORT) nebulizer solution  0.5 mg Nebulization BID   diltiazem  60 mg Oral Q6H   ferrous gluconate  324 mg Oral Q breakfast   insulin aspart  0-5 Units Subcutaneous QHS   insulin aspart  0-6 Units Subcutaneous TID WC   ipratropium-albuterol  3 mL Nebulization Q6H   levothyroxine  100 mcg Oral Q0600   metoprolol tartrate  25 mg Oral BID   multivitamin with minerals  1 tablet Oral Daily   nutrition supplement (JUVEN)  1 packet Oral BID BM   Ensure Max Protein  11 oz Oral QHS   venlafaxine  100 mg Oral BID    Assessment: Patient admitted with new onset A/Fib and worsening renal function. Antibiotics for osteomyelitis were initiated and pharmacy consulted for heparin drip management.  Noted  history of DM, CKD-stage 4,HTN, thyroid disorder, and liver cirrhosis secondary to NASH  10/30:  HL @ 0115 = 0.10, subtherapeutic  10/30:  HL @ 2051 = 0.18, subtherapeutic increase drip rate to 1800 units/hr 10/31:  HL @ 0704 = 0.43, therapeutic.  11/2: HL @ 0855 = 0.64, therapeutic.   Goal of Therapy:  Heparin level 0.3-0.7 units/ml Monitor platelets by anticoagulation protocol: Yes   Plan:  Heparin level is therapeutic. Will continue heparin infusion at 1800 units/hr. Recheck heparin level in 8 hours. CBC daily while on heparin.    Oswald Hillock 11/13/2020 9:47 AM

## 2020-11-13 NOTE — Significant Event (Addendum)
Rapid Response Event Note   Reason for Call :  Altered mental status  Initial Focused Assessment:  Rapid response RN arrived in patients room with patient sitting up in bed surrounded by 2A staff. Doctor arrived just as rapid response RN was arriving. Patient arousable to voice but loud voice needed. Only mumbling in response to questions. Unable to follow commands. Profound, non focal weakness, but able to move all extremities to pain. Pupils about 2mm round and equally reactive to light. Lungs with wheezing on left side. Patient with stable vital signs (see vital sign flowsheet) on 2L nasal cannula, but increased work of breathing with abdominal muscle use. Patient required 2A to scrape uneaten pills from her mouth and required several instances of oral suctioning to clear secretions. CBG was 114 this morning and no coverage given. Currently has heparin infusion running, only paused briefly overnight for MRI attempt per 2A staff. HR afib but appears only from 80-90s. No history listed of stroke or seizure activity. Patient just received 60 mg of solumedrol IV.  Interventions:  40 mg of lasix given per MD orders. Chest x-ray and ABG obtained see results review. Patient taken to CT. After CT, patient transferred to ICU 10.   Plan of Care:  Transferred to ICU 9. Took report from [employer] from 2A. Upon arrival in ICU, Dr. Mortimer Fries and Jearld Adjutant NP evaluated patient and patient emergently intubated for airway protection.  Event Summary:   MD Notified: Dr. Dwyane Dee Call Time: 08:52 Arrival Time: 08:54 End Time: 10: [place], Jaynie Bream, RN

## 2020-11-13 NOTE — Progress Notes (Signed)
Patient has only family friend listed as emergency contact. Called and left message for her to call.

## 2020-11-13 NOTE — Progress Notes (Signed)
Patient on 84% on room air, placed her on 2L of O2. Patient now at 94%. Respiratory gave breathing treatment and patient is still have wheezing sounds. Notified MD.

## 2020-11-13 NOTE — Progress Notes (Signed)
Chaplain Maggie reported to pt's room in response to RR page. Pt unavailable. Continued support available per on call chaplain.

## 2020-11-13 NOTE — Progress Notes (Signed)
Pt went down for L Ankle MRI, but pt unable to stay still during her MRI. Notified Dr Damita Dunnings, ordered 0.5mg  Ativan IV, After administered Ativan, pt still agitated. MRI tach had to cancel this time, and redo it again during the day.

## 2020-11-13 NOTE — Progress Notes (Signed)
[facility]  ROUNDING NOTE   Subjective:   Patient was transferred to ICU today She had just been intubated when seen She was given anesthetic and paralytic.  No response when seen Earlier in the day, per nurse report, patient was given pills but became unresponsive during the process of swallowing.   Objective:  Vital signs in last 24 hours:  Temp:  [97.7 F (36.5 C)-98.9 F (37.2 C)] 98.6 F (37 C) (11/02 0736) Pulse Rate:  [46-113] 53 (11/02 1330) Resp:  [13-21] 15 (11/02 1330) BP: (96-132)/(60-88) 96/60 (11/02 1330) SpO2:  [84 %-100 %] 96 % (11/02 1330) FiO2 (%):  [40 %] 40 % (11/02 1330)  Weight change:  Filed Weights   11/09/20 1214  Weight: 86.2 kg    Intake/Output: I/O last 3 completed shifts: In: 1732 [P.O.:1320; Blood:412] Out: 3775 [Urine:3775]   Intake/Output this shift:  Total I/O In: 497.9 [I.V.:327.9; NG/GT:120; IV Piggyback:50] Out: 300 [Urine:300]  Physical Exam: General: NAD, critically ill-appearing  Head: ET tube in place  Eyes: Anicteric, pupils round, tears in eyes  Lungs:  ventilator assist  Heart: Irregular rhythm  Abdomen:  Soft, nontender  Extremities:  no peripheral edema.  Surgical dressing on left foot  Neurologic: Sedated  Skin: No lesions or rashes       Basic Metabolic Panel: Recent Labs  Lab 11/09/20 1220 11/09/20 1818 11/10/20 0649 11/11/20 0636 11/12/20 0540 11/13/20 0203  NA 132*  --  132* 132* 133* 132*  K 3.2*  --  3.5 4.0 4.5 4.1  CL 102  --  103 104 105 104  CO2 18*  --  18* 18* 14* 18*  GLUCOSE 164*  --  152* 133* 131* 117*  BUN 69*  --  64* 72* 76* 87*  CREATININE 3.65*  --  3.50* 3.65* 3.88* 4.13*  CALCIUM 8.4*  --  8.2* 8.6* 8.7* 8.5*  MG  --  2.0  --   --   --   --      Liver Function Tests: Recent Labs  Lab 11/09/20 1220  AST 35  ALT 25  ALKPHOS 84  BILITOT 0.4  PROT 6.4*  ALBUMIN 2.7*    No results for input(s): LIPASE, AMYLASE in the last 168 hours. No results for  input(s): AMMONIA in the last 168 hours.  CBC: Recent Labs  Lab 11/09/20 1220 11/10/20 0649 11/11/20 0636 11/12/20 0540 11/13/20 0203  WBC 15.8* 13.5* 15.6* 15.9* 15.9*  NEUTROABS 13.5*  --   --   --   --   HGB 8.0* 7.2* 7.4* 7.0* 8.5*  HCT 24.5* 22.3* 22.6* 21.9* 26.7*  MCV 86.3 85.4 86.3 87.6 85.3  PLT 190 180 185 189 220     Cardiac Enzymes: Recent Labs  Lab 11/09/20 1818  CKTOTAL 184     BNP: Invalid input(s): POCBNP  CBG: Recent Labs  Lab 11/12/20 1626 11/12/20 2039 11/13/20 0738 11/13/20 1015 11/13/20 1213  GLUCAP 117* 120* 114* 132* 126*     Microbiology: Results for orders placed or performed during the hospital encounter of 11/09/20  Resp Panel by RT-PCR (Flu A&B, Covid) Nasopharyngeal Swab     Status: None   Collection Time: 11/09/20  6:25 PM   Specimen: Nasopharyngeal Swab; Nasopharyngeal(NP) swabs in vial transport medium  Result Value Ref Range Status   SARS Coronavirus 2 by RT PCR NEGATIVE NEGATIVE Final    Comment: (NOTE) SARS-CoV-2 target nucleic acids are NOT DETECTED.  The SARS-CoV-2 RNA is generally detectable in upper  respiratory specimens during the acute phase of infection. The lowest concentration of SARS-CoV-2 viral copies this assay can detect is 138 copies/mL. A negative result does not preclude SARS-Cov-2 infection and should not be used as the sole basis for treatment or other patient management decisions. A negative result may occur with  improper specimen collection/handling, submission of specimen other than nasopharyngeal swab, presence of viral mutation(s) within the areas targeted by this assay, and inadequate number of viral copies(<138 copies/mL). A negative result must be combined with clinical observations, patient history, and epidemiological information. The expected result is Negative.  Fact Sheet for Patients:  EntrepreneurPulse.com.au  Fact Sheet for Healthcare Providers:   IncredibleEmployment.be  This test is no t yet approved or cleared by the [place] FDA and  has been authorized for detection and/or diagnosis of SARS-CoV-2 by FDA under an Emergency Use Authorization (EUA). This EUA will remain  in effect (meaning this test can be used) for the duration of the COVID-19 declaration under Section 564(b)(1) of the Act, 21 U.S.C.section 360bbb-3(b)(1), unless the authorization is terminated  or revoked sooner.       Influenza A by PCR NEGATIVE NEGATIVE Final   Influenza B by PCR NEGATIVE NEGATIVE Final    Comment: (NOTE) The Xpert Xpress SARS-CoV-2/FLU/RSV plus assay is intended as an aid in the diagnosis of influenza from Nasopharyngeal swab specimens and should not be used as a sole basis for treatment. Nasal washings and aspirates are unacceptable for Xpert Xpress SARS-CoV-2/FLU/RSV testing.  Fact Sheet for Patients: EntrepreneurPulse.com.au  Fact Sheet for Healthcare Providers: IncredibleEmployment.be  This test is not yet approved or cleared by the [place] FDA and has been authorized for detection and/or diagnosis of SARS-CoV-2 by FDA under an Emergency Use Authorization (EUA). This EUA will remain in effect (meaning this test can be used) for the duration of the COVID-19 declaration under Section 564(b)(1) of the Act, 21 U.S.C. section 360bbb-3(b)(1), unless the authorization is terminated or revoked.  Performed at [facility], [address] [place], [place] [address]   MRSA Next Gen by PCR, Nasal     Status: None   Collection Time: 11/10/20  1:14 AM   Specimen: Nasal Mucosa; Nasal Swab  Result Value Ref Range Status   MRSA by PCR Next Gen NOT DETECTED NOT DETECTED Final    Comment: (NOTE) The GeneXpert MRSA Assay (FDA approved for NASAL specimens only), is one component of a comprehensive MRSA colonization surveillance program. It is not intended to  diagnose MRSA infection nor to guide or monitor treatment for MRSA infections. Test performance is not FDA approved in patients less than 27 years old. Performed at [facility], [address] [place], [place] [address]   Aerobic/Anaerobic Culture w Gram Stain (surgical/deep wound)     Status: None (Preliminary result)   Collection Time: 11/11/20  3:23 PM   Specimen: Foot, Left; Abscess  Result Value Ref Range Status   Specimen Description   Final    WOUND LEFT FOOT ABSCESS Performed at [facility], [address], [place], [place] [address]    Special Requests   Final    NONE Performed at [facility], [address] [place], [place] [address]    Gram Stain   Final    NO ORGANISMS SEEN SQUAMOUS EPITHELIAL CELLS PRESENT MODERATE WBC PRESENT, PREDOMINANTLY MONONUCLEAR MODERATE GRAM POSITIVE COCCI    Culture   Final    RARE STAPHYLOCOCCUS AUREUS RARE PASTEURELLA MULTOCIDA Usually susceptible to penicillin  and other beta lactam agents,quinolones,macrolides and tetracyclines. HOLDING FOR POSSIBLE ANAEROBE SUSCEPTIBILITIES TO FOLLOW Performed at [facility], [place] [address], [place], [place] [address]    Report Status PENDING  Incomplete  Aerobic/Anaerobic Culture w Gram Stain (surgical/deep wound)     Status: Abnormal (Preliminary result)   Collection Time: 11/11/20  3:32 PM   Specimen: Other Source; Tissue  Result Value Ref Range Status   Specimen Description   Final    WOUND 2ND METATARSAL Performed at [facility], [address], [place], [place] [address]    Special Requests   Final    NONE Performed at [facility], [address] [place], [place] [address]    Gram Stain   Final    NO ORGANISMS SEEN SQUAMOUS EPITHELIAL CELLS PRESENT FEW WBC PRESENT, PREDOMINANTLY MONONUCLEAR FEW GRAM POSITIVE COCCI    Culture (A)  Final    STREPTOCOCCUS MITIS/ORALIS HOLDING FOR POSSIBLE ANAEROBE CULTURE REINCUBATED FOR  BETTER GROWTH Performed at [facility], [place] [address], [place], [place] [address]    Report Status PENDING  Incomplete  Aerobic/Anaerobic Culture w Gram Stain (surgical/deep wound)     Status: None (Preliminary result)   Collection Time: 11/11/20  4:26 PM   Specimen: Other Source; Tissue  Result Value Ref Range Status   Specimen Description   Final    WOUND LEFT METATARSAL Performed at [facility], [address], [place], [place] [address]    Special Requests   Final    NONE Performed at [facility], [place], [place] [address]    Gram Stain NO WBC SEEN NO ORGANISMS SEEN   Final   Culture   Final    NO GROWTH 2 DAYS NO ANAEROBES ISOLATED; CULTURE IN PROGRESS FOR 5 DAYS Performed at [facility] [address], [place], [place] [address]    Report Status PENDING  Incomplete  MRSA Next Gen by PCR, Nasal     Status: None   Collection Time: 11/13/20 11:22 AM   Specimen: Nasal Mucosa; Nasal Swab  Result Value Ref Range Status   MRSA by PCR Next Gen NOT DETECTED NOT DETECTED Final    Comment: (NOTE) The GeneXpert MRSA Assay (FDA approved for NASAL specimens only), is one component of a comprehensive MRSA colonization surveillance program. It is not intended to diagnose MRSA infection nor to guide or monitor treatment for MRSA infections. Test performance is not FDA approved in patients less than 54 years old. Performed at [facility], [address] [place], [place] [address]     Coagulation Studies: No results for input(s): LABPROT, INR in the last 72 hours.   Urinalysis: No results for input(s): COLORURINE, LABSPEC, PHURINE, GLUCOSEU, HGBUR, BILIRUBINUR, KETONESUR, PROTEINUR, UROBILINOGEN, NITRITE, LEUKOCYTESUR in the last 72 hours.  Invalid input(s): APPERANCEUR     Imaging: CT HEAD WO CONTRAST (5MM)  Result Date: 11/13/2020 CLINICAL DATA:  Mental status changes EXAM: CT HEAD WITHOUT CONTRAST  TECHNIQUE: Contiguous axial images were obtained from the base of the skull through the vertex without intravenous contrast. COMPARISON:  11/09/2020 FINDINGS: Brain: stable atrophy pattern and chronic white matter microvascular ischemic changes throughout both cerebral hemispheres. Limited with some motion artifact. No acute intracranial hemorrhage, mass lesion, new infarction, midline shift, herniation, hydrocephalus, or extra-axial fluid collection. No focal mass effect or edema. Cisterns are patent. Cerebellar atrophy as well. Vascular: Intracranial atherosclerosis at the skull base. No hyperdense vessel. Skull: Normal. Negative for fracture  or focal lesion. Sinuses/Orbits: No acute finding. Other: None. IMPRESSION: Stable atrophy and white matter microvascular ischemic changes. No acute intracranial abnormality by noncontrast CT. Electronically Signed   By: Jerilynn Mages.  Shick M.D.   On: 11/13/2020 09:58   MR ANKLE LEFT WO CONTRAST  Result Date: 11/13/2020 CLINICAL DATA:  Suspected osteomyelitis of the ankle. Recent diagnosis of osteomyelitis involving the second metatarsal and proximal phalanx. EXAM: MRI OF THE LEFT ANKLE WITHOUT CONTRAST TECHNIQUE: Multiplanar, multisequence MR imaging of the ankle was performed. No intravenous contrast was administered. COMPARISON:  Radiographs and MRI of 11/09/2020 FINDINGS: The patient had great difficulty remaining stationary for today's exam. Only a single diagnostic series could be obtained. Osseous structures: Interval resection of the second metatarsal on 11/11/2020, with expected regional postoperative findings. It is difficult on the single series provided to get a good sense of the base of the third metatarsal and its articulation with the lateral cuneiform, there is potentially some accentuated fluid signal and irregularity the articulation of the lateral cuneiform and the base of the third metatarsal. No discrete edema signal to suggest osteomyelitis involving the  distal tibia/fibula, calcaneus, cuboid, talus, navicular, or medial cuneiform. The middle and lateral cuneiforms are somewhat irregular and difficult to fully characterize on this single series. Much of this irregularity is probably simply from arthropathy along the Lisfranc joint and postoperative findings. Ligamentous structures: Limited assessment due to the lack of images, no overt discontinuity along the lateral ligamentous complex or deltoid ligament. Musculotendinous: Extensor digitorum tenosynovitis. Common peroneus tendon sheath tenosynovitis noted with suspected longitudinal tearing of the peroneus brevis. Distal tibialis posterior tenosynovitis and tendinopathy. Achilles tendon intact. Plantar fascia poorly assessed. Soft tissues: Extensive subcutaneous edema circumferentially in the distal calf, mitigating somewhat in the ankle region. Dorsal subcutaneous edema in the foot. IMPRESSION: 1. Limited assessment as the patient could only tolerate a single diagnostic series to be performed. This is a small subset of the normal MRI ankle exam. Reduced diagnostic sensitivity and specificity. 2. Interval resection of the second metatarsal. Possible arthropathy between the lateral cuneiform and the base of the third metatarsal. No definite osteomyelitis in the ankle region. 3. Longitudinal tear of the peroneus brevis with peroneus tenosynovitis. 4. Distal tibialis posterior tenosynovitis and tendinopathy. There is also extensor digitorum tenosynovitis. 5. Extensive subcutaneous edema circumferentially in the distal calf. Dorsal subcutaneous edema in the foot. Electronically Signed   By: Van Clines M.D.   On: 11/13/2020 07:33   DG Chest Port 1 View  Result Date: 11/13/2020 CLINICAL DATA:  Intubation EXAM: PORTABLE CHEST 1 VIEW COMPARISON:  Portable exam 1046 hours compared to 0907 hours FINDINGS: Tip of endotracheal tube is at carina directed toward RIGHT mainstem bronchus, recommend withdrawal 2-3 cm.  Enlargement of cardiac silhouette with vascular congestion. RIGHT lung infiltrate slightly increased from previous exam. Mild RIGHT basilar atelectasis. No pleural effusion or pneumothorax. IMPRESSION: Tip of endotracheal tube is at carina, recommend withdrawal 2-3 cm. Enlargement of cardiac silhouette with pulmonary vascular congestion and increased RIGHT lung infiltrate. Findings called to patient's nurse Sarah RN in CCU on 11/13/2020 at 1110 hours. Electronically Signed   By: Lavonia Dana M.D.   On: 11/13/2020 11:21   DG Chest Port 1 View  Result Date: 11/13/2020 CLINICAL DATA:  Respiratory difficulty EXAM: PORTABLE CHEST 1 VIEW COMPARISON:  11/12/2020 FINDINGS: Heart size within normal limits. No pulmonary vascular congestion. Airspace opacity in the right lower lung likely due to pneumonitis. Lungs otherwise clear. No pneumothorax. IMPRESSION: Right basilar airspace opacity  likely due to pneumonitis. Electronically Signed   By: Miachel Roux M.D.   On: 11/13/2020 09:15   DG Chest Port 1 View  Result Date: 11/12/2020 CLINICAL DATA:  Wheezing EXAM: PORTABLE CHEST 1 VIEW COMPARISON:  Chest x-ray 11/09/2020 FINDINGS: The heart and mediastinal contours are within normal limits. Patchy right lung airspace and interstitial opacity. No pulmonary edema. No pleural effusion. No pneumothorax. No acute osseous abnormality. IMPRESSION: Patchy right lung airspace and interstitial airspace opacity that may represent infection/inflammation. Electronically Signed   By: Iven Finn M.D.   On: 11/12/2020 15:05   DG Abd Portable 1V  Result Date: 11/13/2020 CLINICAL DATA:  Enteric tube placement EXAM: PORTABLE ABDOMEN - 1 VIEW COMPARISON:  None. FINDINGS: Tip of enteric tube is seen in the region of body of stomach. Bowel gas pattern is nonspecific. Arterial calcifications are seen. Right margin of abdominal aorta is projecting to the right of lumbar spine. This may be due to tortuosity or aneurysmal dilation of  abdominal aorta. Degenerative changes are noted with disc space narrowing, bony spurs and facet hypertrophy in the lumbar spine. IMPRESSION: Tip and side port of enteric tube are noted within the stomach. Nonspecific bowel gas pattern. Calcification in the abdominal aorta is projecting slightly to the right of lumbar spine which may be due to tortuosity or aneurysmal dilation. Electronically Signed   By: Elmer Picker M.D.   On: 11/13/2020 11:03     Medications:    sodium chloride Stopped (11/11/20 0850)   fentaNYL infusion INTRAVENOUS 75 mcg/hr (11/13/20 1300)   heparin Stopped (11/13/20 1210)   piperacillin-tazobactam (ZOSYN)  IV Stopped (11/13/20 0630)   piperacillin-tazobactam (ZOSYN)  IV     propofol (DIPRIVAN) infusion 15 mcg/kg/min (11/13/20 1300)    acetaminophen  650 mg Oral Once   allopurinol  200 mg Oral Daily   budesonide (PULMICORT) nebulizer solution  0.5 mg Nebulization BID   chlorhexidine gluconate (MEDLINE KIT)  15 mL Mouth Rinse BID   Chlorhexidine Gluconate Cloth  6 each Topical Daily   diltiazem  60 mg Per Tube Q6H   docusate  100 mg Per Tube BID   fentaNYL       ferrous gluconate  324 mg Oral Q breakfast   insulin aspart  0-5 Units Subcutaneous QHS   insulin aspart  0-6 Units Subcutaneous TID WC   ipratropium-albuterol  3 mL Nebulization Q6H   levothyroxine  100 mcg Oral Q0600   mouth rinse  15 mL Mouth Rinse 10 times per day   metoprolol tartrate  25 mg Oral BID   multivitamin with minerals  1 tablet Oral Daily   nutrition supplement (JUVEN)  1 packet Oral BID BM   pantoprazole (PROTONIX) IV  40 mg Intravenous Daily   polyethylene glycol  17 g Per Tube Daily   Ensure Max Protein  11 oz Oral QHS   venlafaxine  100 mg Oral BID   sodium chloride, [DISCONTINUED] acetaminophen **OR** acetaminophen, acetaminophen, fentaNYL, ipratropium-albuterol, meclizine, ondansetron **OR** ondansetron (ZOFRAN) IV  Assessment/ Plan:  Autumn [name] is a 70 y.o.   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  AKI likely secondary to ATN from hypotension and concurrent acute illness. Continue supportive care, Avoid NSAIDs and iv contrast  It is likely that renal function will continue to worse based on the acute events from earlier today.  Further plan as hospital course progresses.    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    #Anemia of CKD Monitor hemoglobin closely Oral iron supplementation daily    LOS: 4 Samyrah Bruster 11/2/20222:24 PM

## 2020-11-13 NOTE — Procedures (Signed)
Routine EEG Report  Autumn [name] is a 70 y.o. female with a history of encephalopathy who is undergoing an EEG to evaluate for seizures.  Report: This EEG was acquired with electrodes placed according to the International 10-20 electrode system (including Fp1, Fp2, F3, F4, C3, C4, P3, P4, O1, O2, T3, T4, T5, T6, A1, A2, Fz, Cz, Pz). The following electrodes were missing or displaced: none.  The best background was 5-6 Hz. This activity was near-continuous although occasionally 1-2 seconds of intervening diffuse suppression is noted. This activity is reactive to stimulation. Drowsiness was manifested by background fragmentation; deeper stages of sleep were identified by K complexes and sleep spindles. There was no focal slowing. There were no interictal epileptiform discharges. There were no electrographic seizures identified. Photic stimulation and hyperventilation were not performed.  Impression and clinical correlation: This EEG was obtained while sedated and asleep and is abnormal due to moderate diffuse slowing and occasional brief periods of intervening suppression both indicative of cerebral dysfunction, medication effect, or both.   Su Monks, MD [facility] [phone]  If 7pm- 7am, please page neurology on call as listed in [place].

## 2020-11-13 NOTE — Consult Note (Signed)
NAME:  Autumn [name], MRN:  [id number], DOB:  04-Sep-1950, LOS: 50 ADMISSION DATE:  11/09/2020 CONSULTATION DATE:  11/13/20 REFERRING MD:  Dr Leslye Peer CHIEF COMPLAINT:  Fall  70 yo F admitted 10/29 s/p falls at home, worsening left foot chronic diabetic ulcer with osteomyelitis 2nd+3rd metatarsals and cellulitis noted on MRI, s/p L foot amputation of the 2nd metatarsal and toe, I&D deep abscess multiple fascial planes, and bone biopsy open deep third metatarsal on 10/31. Other comorbidities include sepsis without shock secondary to osteomyelitis and cellulitis left foot (IV zosyn), AKI on CKD, rapid afib (Cardizem + Heparin gtt), NSTEMI suspect demand ischemia, anemia acute on chronic. Transferred to ICU and intubated 11/2 after evolving neuro changes with unresponsiveness after coughing episode; post rapid response and code stroke. Patient is being followed by cardiology, nephrology, podiatry, and infectious disease. Neurology consulted; heparin gtt on hold pending further eval.   History of Present Illness:  At time of consultation, patient is unable to provide history of present illness, review of systems, or subjective information. All information below was obtained by patient records and by patient's main contact person (friend), [name].  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 Patient has been following with a podiatrist at [facility] in the outpatient setting and reportedly had recently refused a left foot surgery secondary to her chronic wounds; she also has a history of left foot surgery for the same chronic issues in the past. She has received oral  antibiotics, augmentin and then keflex at [facility] for Enterobacter aerogens culture positive in the foot ulcer in August 2022. Per patient's contact person, Autumn [name], patient uses an electronic cigarette now with a former smoking history. She does not drink any alcohol, use illicit drugs, or misuse prescription medications. Patient is a former Psychologist, sport and exercise.   Patient was brought via EMS to the ER on 10/29 status post multiple falls and reportedly hit her head multiple times during these falls, also with worsening left foot and ankle pain, swelling, and redness. Patient was noted in new rapid atrial fibrillation in the ER. Labs in the ED showed sodium of 132, potassium 3.2, BUN 16, creatinine 3.65 baseline of 3.19, AST 35, BNP 360, troponin 147, lactate 1.3, white count 15.8, Hb 8 and platelet 190. CT of the head was unremarkable, [place] Left LE was negative for DVT, CXR was unremarkable, and XR L Foot was concerning for osteomyelitis 2nd + 3rd metatarsals with soft tissue swelling of foot and ankle. Patient was admitted to the hospital with delay in transfer from the ER due to bed availability. She was started on Cardizem infusion for rate control and continuous heparin infusion for anticoagulation. IV Cefepime, vancomycin, flagyl were initiated per sepsis protocol. Podiatry, cardiology, nephrology were consulted initially with infectious disease consulted on 11/1 (antibiotics changed to Zosyn). Podiatrist recommended MRI left foot which showed septic arthritis of the second MTP joint and osteomyelitis of the second metatarsal and second proximal phalanx also with concern for an abscess and cellulitis. Patient is status post left foot amputation of the 2nd metatarsal and toe, I&D deep abscess multiple fascial planes, and bone biopsy open deep third metatarsal on 11/11/20. During the procedure, noted findings included purulence found  tracking up along the extensor tendons.   Today, 11/13/20, patient was taken this am  to MRI for scan of her left foot; however, she was unable to complete the MRI secondary to agitation despite IV ativan administration. Patient reportedly has had waxing/waning mental status with possibly some hallucinations and decreased orientation. Per the RN from the floor, patient was able to verbalize at least her name and date of birth this morning and was following commands.  However, patient's mental status reportedly declined through the morning today with speech changes. She became unresponsive after a coughing fit episode while taking her oral medications with fluids. Rapid response was paged, as well as Code Stroke.   Patient was transferred to the ICU after her CT Head per stroke protocols. Patient was assessed at bedside upon arrival to the ICU. Patient is awake, agitated, attempting to follow simple commands; however she is unable to lift her arms on command. She has some movement of right arm when requested to lift her arms. She is tracking with eyes open and responds to questions; however, her speech is garbled and unintelligible. When asked if in pain, patient nods and speaks but her speech is garbled. Patient also has noted increased oral secretions sparking concern for airway protection. Decision was made to emergently intubate the patient for mechanical ventilation and airway management during suspected neurologic event with possible aspiration concern. Post intubation, patient was started on continuous propofol and fentanyl infusions for sedation/analgesia. Notified patient's contact person [name] of patient's status change and emergent procedure, who arrived post intubation and provided more history of recent illness (included above).   Pertinent  Medical History   Past Medical History:  Diagnosis Date   Diabetes mellitus without complication ([place])    Hypertension    Kidney stone    Thyroid disease   CKD stage 4/5 Chronic diastolic heart failure Anemia of chronic  disease Cirrhosis - NASH OSA Chronic diabetic foot ulcer - has been following with podiatry outpatient and refused surgery within last 3 months  Significant Hospital Events: Including procedures, antibiotic start and stop dates in addition to other pertinent events   10/29: presented to [facility] ER s/p multiple falls, 5-weeks duration left foot pain, swelling, redness, hit head x2 during fall. ER workup: EKG showed rapid afib, CT head unremarkable, [place] Left LE no DVT, CXR unremarkable, XR L Foot concerning for osteomyelitis 2nd + 3rd metatarsals with soft tissue swelling of foot and ankle. Also with elevated BNP, elevated troponin, rapid Afib, concern for early sepsis, elevated BUN/cr acute renal failure on CKD (baseline Cr 3.19).  10/29: Admitted to floor, started on cardizem infusion for rate control,continuous heparin infusion for anticoagulation in setting of afib. IV Cefepime, vancomycin, flagyl initiated. Podiatry, cardiology, nephrology consulted. MRI left foot: septic arthritis of the second MTP joint and osteomyelitis of the second metatarsal and second proximal phalanx also with concern for an abscess and cellulitis 10/31: Surgery completed: Left foot Amputation 2nd metatarsal and toe, I&D deep abscess multiple fascial planes, bone biopsy open deep third metatarsal. Note: Purulence found tracking up along the extensor tendons 11/1: ID consulted for infection mgmt; antibiotics adjusted to zosyn 11/2: Unable to complete MRI foot due to agitation despite IV ativan administration. Developed new confusion per RN which progressively worsened over the morning. Became unresponsive after coughing episode during pill swallowing; rapid response paged, stroke alert paged.  11/2: Transferred to ICU rm 10 after CT Head per stroke alert. Patient awake, attempting to follow simple commands however  unable to lift arms on command. Tracking, responds to voice; however speech is garbled and unintelligible. When asked  if in pain, patient nods and speaks but garbled. + oral secretions, concern for airway protection and patient was emergently intubated.  Notified patient's contact person [name], who arrived post intubation.   Micro Data:  10/30 MRSA PCR: (-) 10/31 L metatarsal tissue: NGTD 10/31 L foot abscess: NGTD 10/31 2nd metatarsal tissue: NGTD  Antimicrobials:  10/29 Vancomycin >> 10/31 10/29 Cefepime >> 11/01 10/29 Metronidazole >> 11/01 11/02 Zosyn >>  Interim History / Subjective:  Transferred to ICU rm 10 after CT Head per stroke alert. Patient evaluated after arrival to ICU. She was awake, attempting to follow simple commands however unable to lift arms on command. Tracking, responds to voice; however speech is garbled and unintelligible. When asked if in pain, patient nods and speaks but garbled. + oral secretions.  Given concern for airway protection in setting of altered mental status, coughing episode during pill administration concern for aspiration, with increased secretions at time of ICU arrival, patient emergently intubated for mechanical ventilation. Post intubation, patient started on propofol and fentanyl infusions for sedation/analgesia.   Patient is unable to provide history, RoS, or subjective at this time secondary to [facility] and subsequent intubation.   Objective   Blood pressure 117/78, pulse 87, temperature 98.6 F (37 C), temperature source Oral, resp. rate 14, height '5\' 6"'  (1.676 m), weight 86.2 kg, SpO2 97 %.    Vent Mode: PRVC FiO2 (%):  [40 %] 40 % Set Rate:  [14 bmp] 14 bmp Vt Set:  [450 mL] 450 mL PEEP:  [5 cmH20] 5 cmH20 Plateau Pressure:  [14 cmH20] 14 cmH20   Intake/Output Summary (Last 24 hours) at 11/13/2020 1132 Last data filed at 11/13/2020 0515 Gross per 24 hour  Intake 1372 ml  Output 2075 ml  Net -703 ml   Filed Weights   11/09/20 1214  Weight: 86.2 kg    REVIEW OF SYSTEMS PATIENT IS UNABLE TO PROVIDE COMPLETE REVIEW OF SYSTEMS DUE TO SEVERE  CRITICAL ILLNESS AND TOXIC METABOLIC ENCEPHALOPATHY  PHYSICAL EXAMINATION:  GENERAL:critically ill appearing, +resp distress EYES: Pupils equal, round 34m post intubation, nonreactive to light.  No scleral icterus.  MOUTH: Moist mucosal membrane. INTUBATED NECK: Supple.  PULMONARY: +rhonchi diffuse bilaterally, +mild wheezing expiratory bilaterally CARDIOVASCULAR: S1 and S2.  No murmurs  GASTROINTESTINAL: Soft, nontender, non-distended. Positive bowel sounds.  MUSCULOSKELETAL: No swelling, clubbing, or edema.  Noted gauze bandage with serosanguinous drainage left foot and left ankle NEUROLOGIC: Prior to intubation, awake but unable to follow simple commands, garbled speech when asked direct questions (name, age, location, pain assessment).  SKIN: intact,warm,dry  +erythema noted above bandages left LE   Labs/imaging that I havepersonally reviewed  (right click and "Reselect all SmartList Selections" daily)  10/29 CT Head >>unremarkable 10/29 CXR >>unremarkable 10/29 XR Left Foot >> concern for osteomyelitis changes 2nd and 3rd metatarsals with soft tissue swelling foot/ankle 10/29 [place] DVT >>negative for DVT 10/29: MR Left foot >>septic arthritis of the second MTP joint and osteomyelitis of the second metatarsal and second proximal phalanx also with concern for an abscess and cellulitis 11/1 CXR >>Patchy right lung airspace and interstitial airspace opacity  11/2 CT Head >> no acute intracranial abnormality  11/2 CXR post intubation >> ETT at [place] directed towards R mainstem bronchus, recommend withdrawal 2-3cm, Enlargement of cardiac silhouette with pulmonary vascular congestion and increased right lung infiltrate.  Resolved Hospital Problem list  ASSESSMENT AND PLAN  70 yo F admitted 10/29 s/p falls at home, worsening left foot chronic diabetic ulcer with osteomyelitis 2nd+3rd metatarsals and cellulitis noted on MRI, s/p L foot amputation of the 2nd metatarsal and toe, I&D  deep abscess multiple fascial planes, and bone biopsy open deep third metatarsal on 10/31. Other comorbidities include sepsis without shock secondary to osteomyelitis and cellulitis left foot (IV zosyn), AKI on CKD, rapid afib (Cardizem + Heparin gtt), NSTEMI suspect demand ischemia, anemia acute on chronic. Transferred to ICU and intubated 11/2 after evolving neuro changes with unresponsiveness after coughing episode; post rapid response and code stroke. Patient is being followed by cardiology, nephrology, podiatry, and infectious disease. Neurology consulted; heparin gtt on hold pending further eval.   Severe Acute Hypoxic Respiratory Failure New R lung infiltrate, suspect pneumonia, possible aspiration Intubated 11/13/20 ETT withdrawn 2cm after post intubation CXR reviewed -continue Mechanical Ventilator support -continue Bronchodilator Therapy: Duonebs, Pulmicort nebs -Wean Fio2 and PEEP as tolerated -VAP/VENT bundle implementation -will perform SAT/SBT when respiratory parameters are met - Send tracheal aspirate for culture   Vent Mode: PRVC FiO2 (%):  [40 %] 40 % Set Rate:  [14 bmp] 14 bmp Vt Set:  [450 mL] 450 mL PEEP:  [5 cmH20] 5 cmH20 Plateau Pressure:  [14 cmH20] 14 cmH20  Acute encephalopathy Toxic Metabolic versus Acute Neurological Event/CVA In setting of afib and osteomyelitis, high concern for embolic event - CT Head 62/4 reviewed no acute abnormality - MRI Head - EEG - Neurology consult - Assess neuro daily off sedation  Sepsis without septic shock Secondary to left foot osteomyelitis + cellulitis left foot/ankle Hypotension secondary to sedation/MV with sepsis - Use vasopressors to keep MAP>65 as needed - Follow ABG and LA - Follow up cultures; repeat blood cultures, send tracheal aspirate  - Infectious disease following; antibiotics (IV Zosyn) per ID recs - Consider stress dose steroids - Cautious IV fluid resuscitation in setting of CHF - Podiatry following  for L foot - repeat MRI L foot  CARDIAC New atrial fibrillation with RVR NSTEMI POA - suspect demand ischemia Chronic diastolic HF Chronic HTN Echo 10/30>> LVEF 55-60%, mild concentric LV hypertrophy, elevated LV end-diastolic pressure; severely dilated left atrial size; small patent foramen ovale - Cardiology following; appreciate recs - Cardizem + metoprolol po per tube - Continuous heparin infusion on hold pending MRI brain and neuro assessment - concern for septic emboli  -Lasix as tolerated -follow up cardiac enzymes as indicated - Outpatient testing for demand ischemia  Acute Kidney Injury on CKD 4/5 - continue Foley Catheter-assess need -Avoid nephrotoxic agents -Follow urine output, BMP -Ensure adequate renal perfusion, optimize oxygenation -Renal dose medications - Nephrology following; appreciate input  Intake/Output Summary (Last 24 hours) at 11/13/2020 1132 Last data filed at 11/13/2020 0515 Gross per 24 hour  Intake 1372 ml  Output 2075 ml  Net -703 ml   Anemia of Chronic Disease Secondary to CKD 4/5 Query acute component secondary to surgery, dilutional  - Monitor H/H q24h - Monitor for any s/s bleeding - Transfuse for hgb < 7.0  NEUROLOGY Acute toxic metabolic encephalopathy, need for sedation Goal RASS -2 to -3  ENDO Type 2 Diabetes Mellitus - ICU hypoglycemic\Hyperglycemia protocol -check FSBS per protocol  GI Hx NASH with liver cirrhosis GI PROPHYLAXIS as indicated  Hypothyroidism - Levothyroxine   NUTRITIONAL STATUS OG tube verified via KUB 11/2 DIET-->consider initiating TF Nutrition consult Constipation protocol as indicated  ELECTROLYTES -follow labs as needed -replace as needed -pharmacy  consultation and following  Best practice (right click and "Reselect all SmartList Selections" daily)  Diet: NPO Pain/Anxiety/Delirium protocol (if indicated): Yes (RASS goal -2) VAP protocol (if indicated): Yes DVT prophylaxis: Systemic AC -  hold for now GI prophylaxis: PPI Glucose control:  SSI Yes Central venous access:  N/A Arterial line:  N/A Foley:  Yes, and it is still needed Mobility:  bed rest  Code Status:  FULL Disposition:ICU  Labs   CBC: Recent Labs  Lab 11/09/20 1220 11/10/20 0649 11/11/20 0636 11/12/20 0540 11/13/20 0203  WBC 15.8* 13.5* 15.6* 15.9* 15.9*  NEUTROABS 13.5*  --   --   --   --   HGB 8.0* 7.2* 7.4* 7.0* 8.5*  HCT 24.5* 22.3* 22.6* 21.9* 26.7*  MCV 86.3 85.4 86.3 87.6 85.3  PLT 190 180 185 189 614    Basic Metabolic Panel: Recent Labs  Lab 11/09/20 1220 11/09/20 1818 11/10/20 0649 11/11/20 0636 11/12/20 0540 11/13/20 0203  NA 132*  --  132* 132* 133* 132*  K 3.2*  --  3.5 4.0 4.5 4.1  CL 102  --  103 104 105 104  CO2 18*  --  18* 18* 14* 18*  GLUCOSE 164*  --  152* 133* 131* 117*  BUN 69*  --  64* 72* 76* 87*  CREATININE 3.65*  --  3.50* 3.65* 3.88* 4.13*  CALCIUM 8.4*  --  8.2* 8.6* 8.7* 8.5*  MG  --  2.0  --   --   --   --    GFR: Estimated Creatinine Clearance: 14.4 mL/min (A) (by C-G formula based on SCr of 4.13 mg/dL (H)). Recent Labs  Lab 11/09/20 1220 11/09/20 1818 11/10/20 0649 11/11/20 0636 11/12/20 0540 11/13/20 0203  WBC 15.8*  --  13.5* 15.6* 15.9* 15.9*  LATICACIDVEN 1.3 1.0  --   --   --   --     Liver Function Tests: Recent Labs  Lab 11/09/20 1220  AST 35  ALT 25  ALKPHOS 84  BILITOT 0.4  PROT 6.4*  ALBUMIN 2.7*   No results for input(s): LIPASE, AMYLASE in the last 168 hours. No results for input(s): AMMONIA in the last 168 hours.  ABG    Component Value Date/Time   PHART 7.31 (L) 11/13/2020 0856   PCO2ART 32 11/13/2020 0856   PO2ART 71 (L) 11/13/2020 0856   HCO3 16.1 (L) 11/13/2020 0856   ACIDBASEDEF 9.2 (H) 11/13/2020 0856   O2SAT 92.3 11/13/2020 0856     Coagulation Profile: Recent Labs  Lab 11/09/20 1818  INR 1.3*    Cardiac Enzymes: Recent Labs  Lab 11/09/20 1818  CKTOTAL 184    HbA1C: Hgb A1c MFr Bld   Date/Time Value Ref Range Status  11/09/2020 06:25 PM 6.3 (H) 4.8 - 5.6 % Final    Comment:    (NOTE) Pre diabetes:          5.7%-6.4%  Diabetes:              >6.4%  Glycemic control for   <7.0% adults with diabetes     CBG: Recent Labs  Lab 11/12/20 1117 11/12/20 1626 11/12/20 2039 11/13/20 0738 11/13/20 1015  GLUCAP 148* 117* 120* 114* 132*     Past Medical History:  She,  has a past medical history of Diabetes mellitus without complication ([place]), Hypertension, Kidney stone, and Thyroid disease.   Surgical History:   Past Surgical History:  Procedure Laterality Date   ABDOMINAL HYSTERECTOMY  AMPUTATION Left 11/11/2020   Procedure: AMPUTATION RAY - 2nd metatarsal and 3rd metatarsal head;  Surgeon: [name], DPM;  Location: [facility] ORS;  Service: Podiatry;  Laterality: Left;   URETERAL STENT PLACEMENT       Social History:   reports that she has quit smoking. She has never used smokeless tobacco. She reports that she does not drink alcohol and does not use drugs.   Family History:  Her family history includes Hypertension in her mother.   Allergies Allergies  Allergen Reactions   Codeine Nausea And Vomiting     Home Medications  Prior to Admission medications   Medication Sig Start Date End Date Taking? Authorizing Provider  acetaminophen (TYLENOL) 325 MG tablet Take 325 mg by mouth every 6 (six) hours as needed.   Yes [provider]  albuterol (VENTOLIN HFA) 108 (90 Base) MCG/ACT inhaler Inhale 2 puffs into the lungs 4 (four) times daily as needed. 03/25/18  Yes [provider]  allopurinol (ZYLOPRIM) 100 MG tablet Take 200 mg by mouth daily. 10/25/20  Yes [provider]  amLODipine (NORVASC) 5 MG tablet Take 5 mg by mouth daily. 10/11/20  Yes [provider]  aspirin 81 MG EC tablet Take 81 mg by mouth at bedtime. 06/09/06  Yes [provider]  bumetanide (BUMEX) 2 MG tablet Take 4 mg by mouth 2 (two)  times daily. 10/29/20 10/29/21 Yes [provider]  cloNIDine (CATAPRES) 0.3 MG tablet Take 0.3 mg by mouth at bedtime. 11/05/20  Yes [provider]  hydrALAZINE (APRESOLINE) 25 MG tablet Take 50 mg by mouth 3 (three) times daily. 09/04/20  Yes [provider]  levothyroxine (SYNTHROID) 100 MCG tablet Take 100 mcg by mouth every morning. 10/04/20  Yes [provider]  liraglutide (VICTOZA) 18 MG/3ML SOPN Inject 1.2 mg into the skin in the morning. 10/07/18 07/03/21 Yes [provider]  lisinopril (ZESTRIL) 20 MG tablet Take 40 mg by mouth daily. 09/28/20  Yes [provider]  potassium chloride SA (KLOR-CON) 20 MEQ tablet Take 20 mEq by mouth daily. 04/29/20 04/29/21 Yes [provider]  simvastatin (ZOCOR) 20 MG tablet Take 20 mg by mouth every evening. 08/29/18  Yes [provider]  venlafaxine (EFFEXOR) 100 MG tablet Take 100 mg by mouth 2 (two) times daily. 10/21/20  Yes [provider]  meclizine (ANTIVERT) 12.5 MG tablet Take 12.5 mg by mouth 3 (three) times daily as needed. 08/30/20   [provider]  metoCLOPramide (REGLAN) 5 MG tablet Take 5 mg by mouth daily as needed. 11/05/20   [provider]       DVT/GI PRX  assessed I Assessed the need for Labs I Assessed the need for Foley I Assessed the need for Central Venous Line Family Discussion when available I Assessed the need for Mobilization I made an Assessment of medications to be adjusted accordingly Safety Risk assessment completed  Critical Care Time devoted to patient care services described in this note is 60 minutes.   Critical care was necessary to treat /prevent imminent and life-threatening deterioration.  I ANTICIPATE PROLONGED ICU LOS  Patient is critically ill. Patient with Multiorgan failure and at high risk for cardiac arrest and death.    Corrin Parker, M.D.  [facility]  Medical  Director [place] Director [facility] Cardio-Pulmonary Department

## 2020-11-13 NOTE — Progress Notes (Signed)
PROGRESS NOTE    Autumn [name]  SWN:[id number] DOB: 1950/09/18 DOA: 11/09/2020 PCP: Harlow Ohms, MD   Brief Narrative:  This 70 year old female with past medical history of type 2 diabetes mellitus, hypertension, hypothyroidism, NASH presents to the hospital with draining wounds on the bottom of her left foot.  MRI consistent with osteomyelitis and abscess.  Patient taken to the operating room on 11/11/2020 by Dr. Sherryle Lis.  Patient also has acute kidney injury,  atrial fibrillation with rapid ventricular response and anemia of chronic disease. Patient was found lethargic,  unable to swallow,  not following commands.  Stat CT head.  No abnormality.  PCCM consulted.  Patient intubated for airway protection and transferred to ICU.  Assessment & Plan:   Principal Problem:   Sepsis ([place]) Active Problems:   Atrial fibrillation with RVR (HCC)   Osteomyelitis of foot, left, acute ([place])   CKD stage 4 due to type 2 diabetes mellitus (HCC)   Liver cirrhosis secondary to NASH (nonalcoholic steatohepatitis) ([place])   Hypertension   History of anemia due to CKD   Frequent falls   Acute kidney injury superimposed on CKD (HCC)   Cellulitis and abscess of foot, except toes   Infectious tenosynovitis   Wheeze   Atrial fibrillation with rapid ventricular response (HCC)   Severe sepsis secondary to left foot osteomyelitis and abscess: Patient presented with tachycardia, tachypnea, leukocytosis, open left foot wound. MRI consistent with osteomyelitis of third metatarsal head and second proximal flexed and metatarsal head. Patient was taken to the OR on 10/31 for amputation of second metatarsal and bone biopsy of the third metatarsal. There is a concern for infection tracking up towards the ankle .  MRI of the ankle was recommended. Infectious disease consulted recommended patient will need IV antibiotics. She is on triple antibiotic vancomycin Flagyl and cefepime.  Follow-up cultures. Patient  will need PICC line and another podiatry operation during this hospital course.  Acute kidney injury on CKD stage IV: Baseline serum creatinine 3.19, creatinine on arrival 3.88. Patient has received IV hydration and 1 unit PRBC. Continue to monitor serum creatinine.  Anemia of chronic disease: Baseline hemoglobin 8.0.  Transfuse 1 unit PRBC. Posttransfusion hemoglobin 8.4.  Atrial fibrillation with rapid ventricular response: Patient was initially started on Cardizem drip now on oral Cardizem. Patient is started on IV heparin.  Type 2 diabetes with CKD stage IV: Continue regular insulin sliding scale. Hemoglobin A1c 6.3 Continue Victoza.  Recurrent falls: PT and OT evaluation once cleared by podiatry to do so.  Elevated troponin: Likely demand ischemia from sepsis.  Hypothyroidism: Continue levothyroxine  Lethargy  could be due to CVA: Patient was found lethargic in the morning. CT head listed no acute abnormality. Patient not following commands,  PCCM consulted.  Patient intubated for airway protection. 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 No mass effect or hemorrhage.   DVT prophylaxis: Heparin Code Status: Full code. Family Communication:  No family at bed side. Disposition Plan:    Status is: Inpatient  Remains inpatient appropriate because: Patient became lethargic requiring intubation.  Transferred to ICU.  Consultants:  Cardiology Nephrology Infectious disease Podiatry.  Procedures: Intubated and transferred to ICU. Antimicrobials:   Anti-infectives (From admission, onward)    Start     Dose/Rate Route Frequency Ordered Stop   11/13/20 2200  piperacillin-tazobactam (ZOSYN) IVPB 3.375 g        3.375 g 12.5  mL/hr over 240 Minutes Intravenous Every 12 hours 11/13/20 0758     11/13/20 0600  piperacillin-tazobactam (ZOSYN) IVPB 2.25 g        2.25  g 100 mL/hr over 30 Minutes Intravenous Every 8 hours 11/12/20 2155 11/13/20 2159   11/11/20 1600  vancomycin (VANCOCIN) IVPB 1000 mg/200 mL premix  Status:  Discontinued        1,000 mg 200 mL/hr over 60 Minutes Intravenous Every 48 hours 11/09/20 1618 11/10/20 1045   11/11/20 1558  vancomycin (VANCOCIN) powder  Status:  Discontinued          As needed 11/11/20 1558 11/11/20 1558   11/10/20 2000  vancomycin (VANCOCIN) IVPB 1000 mg/200 mL premix  Status:  Discontinued        1,000 mg 200 mL/hr over 60 Minutes Intravenous Every 48 hours 11/10/20 1918 11/12/20 0940   11/09/20 1800  ceFEPIme (MAXIPIME) 2 g in sodium chloride 0.9 % 100 mL IVPB  Status:  Discontinued        2 g 200 mL/hr over 30 Minutes Intravenous Every 24 hours 11/09/20 1619 11/12/20 2146   11/09/20 1619  vancomycin variable dose per unstable renal function (pharmacist dosing)  Status:  Discontinued         Does not apply See admin instructions 11/09/20 1619 11/12/20 1521   11/09/20 1615  vancomycin (VANCOREADY) IVPB 1750 mg/350 mL        1,750 mg 175 mL/hr over 120 Minutes Intravenous  Once 11/09/20 1606 11/09/20 1947   11/09/20 1600  metroNIDAZOLE (FLAGYL) IVPB 500 mg  Status:  Discontinued        500 mg 100 mL/hr over 60 Minutes Intravenous Every 8 hours 11/09/20 1555 11/12/20 2146        Subjective: Patient was found lethargic in the morning, she was not following commands. She opens eyes but then back to the sleep.  Objective: Vitals:   11/13/20 1400 11/13/20 1442 11/13/20 1500 11/13/20 1649  BP: 101/68  107/89   Pulse: 70  (!) 121   Resp: 14  18   Temp:    97.7 F (36.5 C)  TempSrc:    Oral  SpO2: 97% 94% 96%   Weight:      Height:        Intake/Output Summary (Last 24 hours) at 11/13/2020 1707 Last data filed at 11/13/2020 1500 Gross per 24 hour  Intake 1772.01 ml  Output 1775 ml  Net -2.99 ml   Filed Weights   11/09/20 1214  Weight: 86.2 kg    Examination:  General exam: Appears lethargic,  not following commands.  Opens eyes and go back to sleep. Respiratory system: Clear to auscultation.  Increased respiratory effort.  RR 24 Cardiovascular system: S1-S2 heard, regular rhythm, no murmur. Gastrointestinal system: Abdomen is soft, nontender, nondistended, BS +. Central nervous system: Arousable, lethargic, not following commands.  No focal neurological deficits. Extremities: Symmetric 5 x 5 power. Skin: No rashes, lesions or ulcers Psychiatry: Not assessed.    Data Reviewed: I have personally reviewed following labs and imaging studies  CBC: Recent Labs  Lab 11/09/20 1220 11/10/20 0649 11/11/20 0636 11/12/20 0540 11/13/20 0203  WBC 15.8* 13.5* 15.6* 15.9* 15.9*  NEUTROABS 13.5*  --   --   --   --   HGB 8.0* 7.2* 7.4* 7.0* 8.5*  HCT 24.5* 22.3* 22.6* 21.9* 26.7*  MCV 86.3 85.4 86.3 87.6 85.3  PLT 190 180 185 189 220   Basic Metabolic Panel: Recent Labs  Lab 11/09/20 1220 11/09/20 1818 11/10/20 0649 11/11/20 0636 11/12/20 0540 11/13/20 0203  NA 132*  --  132* 132* 133* 132*  K 3.2*  --  3.5 4.0 4.5 4.1  CL 102  --  103 104 105 104  CO2 18*  --  18* 18* 14* 18*  GLUCOSE 164*  --  152* 133* 131* 117*  BUN 69*  --  64* 72* 76* 87*  CREATININE 3.65*  --  3.50* 3.65* 3.88* 4.13*  CALCIUM 8.4*  --  8.2* 8.6* 8.7* 8.5*  MG  --  2.0  --   --   --   --    GFR: Estimated Creatinine Clearance: 14.4 mL/min (A) (by C-G formula based on SCr of 4.13 mg/dL (H)). Liver Function Tests: Recent Labs  Lab 11/09/20 1220  AST 35  ALT 25  ALKPHOS 84  BILITOT 0.4  PROT 6.4*  ALBUMIN 2.7*   No results for input(s): LIPASE, AMYLASE in the last 168 hours. No results for input(s): AMMONIA in the last 168 hours. Coagulation Profile: Recent Labs  Lab 11/09/20 1818  INR 1.3*   Cardiac Enzymes: Recent Labs  Lab 11/09/20 1818  CKTOTAL 184   BNP (last 3 results) No results for input(s): PROBNP in the last 8760 hours. HbA1C: No results for input(s): HGBA1C in the  last 72 hours. CBG: Recent Labs  Lab 11/12/20 2039 11/13/20 0738 11/13/20 1015 11/13/20 1213 11/13/20 1534  GLUCAP 120* 114* 132* 126* 154*   Lipid Profile: No results for input(s): CHOL, HDL, LDLCALC, TRIG, CHOLHDL, LDLDIRECT in the last 72 hours. Thyroid Function Tests: No results for input(s): TSH, T4TOTAL, FREET4, T3FREE, THYROIDAB in the last 72 hours. Anemia Panel: No results for input(s): VITAMINB12, FOLATE, FERRITIN, TIBC, IRON, RETICCTPCT in the last 72 hours. Sepsis Labs: Recent Labs  Lab 11/09/20 1220 11/09/20 1818  LATICACIDVEN 1.3 1.0    Recent Results (from the past 240 hour(s))  Resp Panel by RT-PCR (Flu A&B, Covid) Nasopharyngeal Swab     Status: None   Collection Time: 11/09/20  6:25 PM   Specimen: Nasopharyngeal Swab; Nasopharyngeal(NP) swabs in vial transport medium  Result Value Ref Range Status   SARS Coronavirus 2 by RT PCR NEGATIVE NEGATIVE Final    Comment: (NOTE) SARS-CoV-2 target nucleic acids are NOT DETECTED.  The SARS-CoV-2 RNA is generally detectable in upper respiratory specimens during the acute phase of infection. The lowest concentration of SARS-CoV-2 viral copies this assay can detect is 138 copies/mL. A negative result does not preclude SARS-Cov-2 infection and should not be used as the sole basis for treatment or other patient management decisions. A negative result may occur with  improper specimen collection/handling, submission of specimen other than nasopharyngeal swab, presence of viral mutation(s) within the areas targeted by this assay, and inadequate number of viral copies(<138 copies/mL). A negative result must be combined with clinical observations, patient history, and epidemiological information. The expected result is Negative.  Fact Sheet for Patients:  EntrepreneurPulse.com.au  Fact Sheet for Healthcare Providers:  IncredibleEmployment.be  This test is no t yet approved or  cleared by the [place] FDA and  has been authorized for detection and/or diagnosis of SARS-CoV-2 by FDA under an Emergency Use Authorization (EUA). This EUA will remain  in effect (meaning this test can be used) for the duration of the COVID-19 declaration under Section 564(b)(1) of the Act, 21 U.S.C.section 360bbb-3(b)(1), unless the authorization is terminated  or revoked sooner.       Influenza A  by PCR NEGATIVE NEGATIVE Final   Influenza B by PCR NEGATIVE NEGATIVE Final    Comment: (NOTE) The Xpert Xpress SARS-CoV-2/FLU/RSV plus assay is intended as an aid in the diagnosis of influenza from Nasopharyngeal swab specimens and should not be used as a sole basis for treatment. Nasal washings and aspirates are unacceptable for Xpert Xpress SARS-CoV-2/FLU/RSV testing.  Fact Sheet for Patients: EntrepreneurPulse.com.au  Fact Sheet for Healthcare Providers: IncredibleEmployment.be  This test is not yet approved or cleared by the [place] FDA and has been authorized for detection and/or diagnosis of SARS-CoV-2 by FDA under an Emergency Use Authorization (EUA). This EUA will remain in effect (meaning this test can be used) for the duration of the COVID-19 declaration under Section 564(b)(1) of the Act, 21 U.S.C. section 360bbb-3(b)(1), unless the authorization is terminated or revoked.  Performed at [facility], [address] [place], [place] [address]   MRSA Next Gen by PCR, Nasal     Status: None   Collection Time: 11/10/20  1:14 AM   Specimen: Nasal Mucosa; Nasal Swab  Result Value Ref Range Status   MRSA by PCR Next Gen NOT DETECTED NOT DETECTED Final    Comment: (NOTE) The GeneXpert MRSA Assay (FDA approved for NASAL specimens only), is one component of a comprehensive MRSA colonization surveillance program. It is not intended to diagnose MRSA infection nor to guide or monitor treatment for MRSA infections. Test  performance is not FDA approved in patients less than 8 years old. Performed at [facility], [address] [place], [place] [address]   Aerobic/Anaerobic Culture w Gram Stain (surgical/deep wound)     Status: None (Preliminary result)   Collection Time: 11/11/20  3:23 PM   Specimen: Foot, Left; Abscess  Result Value Ref Range Status   Specimen Description   Final    WOUND LEFT FOOT ABSCESS Performed at [facility], [address] [place], [place] [address]    Special Requests   Final    NONE Performed at [facility], [address] [place], [place] [address]    Gram Stain   Final    NO ORGANISMS SEEN SQUAMOUS EPITHELIAL CELLS PRESENT MODERATE WBC PRESENT, PREDOMINANTLY MONONUCLEAR MODERATE GRAM POSITIVE COCCI    Culture   Final    RARE STAPHYLOCOCCUS AUREUS RARE PASTEURELLA MULTOCIDA Usually susceptible to penicillin and other beta lactam agents,quinolones,macrolides and tetracyclines. HOLDING FOR POSSIBLE ANAEROBE SUSCEPTIBILITIES TO FOLLOW Performed at [facility], [place] [address] [place], [place] [address]    Report Status PENDING  Incomplete  Aerobic/Anaerobic Culture w Gram Stain (surgical/deep wound)     Status: Abnormal (Preliminary result)   Collection Time: 11/11/20  3:32 PM   Specimen: Other Source; Tissue  Result Value Ref Range Status   Specimen Description   Final    WOUND 2ND METATARSAL Performed at [facility], [address], [place], [place] [address]    Special Requests   Final    NONE Performed at [facility], [address] [place], [place] [address]    Gram Stain   Final    NO ORGANISMS SEEN SQUAMOUS EPITHELIAL CELLS PRESENT FEW WBC PRESENT, PREDOMINANTLY MONONUCLEAR FEW GRAM POSITIVE COCCI    Culture (A)  Final    STREPTOCOCCUS MITIS/ORALIS HOLDING FOR POSSIBLE ANAEROBE CULTURE REINCUBATED FOR BETTER GROWTH Performed at [facility], [place] [address], [place], [place]  [address]    Report Status PENDING  Incomplete  Aerobic/Anaerobic Culture w Gram Stain (  surgical/deep wound)     Status: None (Preliminary result)   Collection Time: 11/11/20  4:26 PM   Specimen: Other Source; Tissue  Result Value Ref Range Status   Specimen Description   Final    WOUND LEFT METATARSAL Performed at [facility], [address], [place], [place] [address]    Special Requests   Final    NONE Performed at [facility], [place], [place] [address]    Gram Stain NO WBC SEEN NO ORGANISMS SEEN   Final   Culture   Final    NO GROWTH 2 DAYS NO ANAEROBES ISOLATED; CULTURE IN PROGRESS FOR 5 DAYS Performed at [facility] [address] [place], [place] [address]    Report Status PENDING  Incomplete  MRSA Next Gen by PCR, Nasal     Status: None   Collection Time: 11/13/20 11:22 AM   Specimen: Nasal Mucosa; Nasal Swab  Result Value Ref Range Status   MRSA by PCR Next Gen NOT DETECTED NOT DETECTED Final    Comment: (NOTE) The GeneXpert MRSA Assay (FDA approved for NASAL specimens only), is one component of a comprehensive MRSA colonization surveillance program. It is not intended to diagnose MRSA infection nor to guide or monitor treatment for MRSA infections. Test performance is not FDA approved in patients less than 39 years old. Performed at [facility] [address] [place], [place] [address]          Radiology Studies: CT HEAD WO CONTRAST (5MM)  Result Date: 11/13/2020 CLINICAL DATA:  Mental status changes EXAM: CT HEAD WITHOUT CONTRAST TECHNIQUE: Contiguous axial images were obtained from the base of the skull through the vertex without intravenous contrast. COMPARISON:  11/09/2020 FINDINGS: Brain: stable atrophy pattern and chronic white matter microvascular ischemic changes throughout both cerebral hemispheres. Limited with some motion artifact. No acute intracranial hemorrhage, mass lesion, new infarction,  midline shift, herniation, hydrocephalus, or extra-axial fluid collection. No focal mass effect or edema. Cisterns are patent. Cerebellar atrophy as well. Vascular: Intracranial atherosclerosis at the skull base. No hyperdense vessel. Skull: Normal. Negative for fracture or focal lesion. Sinuses/Orbits: No acute finding. Other: None. IMPRESSION: Stable atrophy and white matter microvascular ischemic changes. No acute intracranial abnormality by noncontrast CT. Electronically Signed   By: Jerilynn Mages.  Shick M.D.   On: 11/13/2020 09:58   MR BRAIN WO CONTRAST  Result Date: 11/13/2020 CLINICAL DATA:  Mental status changes of unknown cause. Recent fall at home. EXAM: MRI HEAD WITHOUT CONTRAST TECHNIQUE: Multiplanar, multiecho pulse sequences of the brain and surrounding structures were obtained without intravenous contrast. COMPARISON:  Head CT 11/13/2020 FINDINGS: Brain: Diffusion imaging shows an acute to subacute infarction affecting the left posterior frontal cortical and subcortical brain, potentially the precentral gyrus. No other acute finding. No sign of hemorrhage or mass effect associated with that. Otherwise, there chronic small-vessel ischemic changes of the pons. Cerebral hemispheres show moderate chronic small-vessel ischemic changes of the white matter. No mass lesion, hemorrhage, hydrocephalus or extra-axial collection. Few punctate foci of hemosiderin deposition noted in the pons and cerebral hemispheres. Vascular: Major vessels at the base of the brain show flow. 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 No mass effect or hemorrhage. Chronic small-vessel ischemic changes elsewhere affecting the brainstem and cerebral hemispheric white matter, some associated with chronic punctate hemosiderin deposition. Electronically  Signed   By: Nelson Chimes M.D.   On: 11/13/2020 16:36   MR ANKLE LEFT WO CONTRAST  Result Date: 11/13/2020 CLINICAL DATA:  Suspected osteomyelitis of the ankle. Recent diagnosis of osteomyelitis involving the second metatarsal and proximal phalanx. EXAM: MRI OF THE LEFT ANKLE WITHOUT CONTRAST TECHNIQUE: Multiplanar, multisequence MR imaging of the ankle was performed. No intravenous contrast was administered. COMPARISON:  Radiographs and MRI of 11/09/2020 FINDINGS: The patient had great difficulty remaining stationary for today's exam. Only a single diagnostic series could be obtained. Osseous structures: Interval resection of the second metatarsal on 11/11/2020, with expected regional postoperative findings. It is difficult on the single series provided to get a good sense of the base of the third metatarsal and its articulation with the lateral cuneiform, there is potentially some accentuated fluid signal and irregularity the articulation of the lateral cuneiform and the base of the third metatarsal. No discrete edema signal to suggest osteomyelitis involving the distal tibia/fibula, calcaneus, cuboid, talus, navicular, or medial cuneiform. The middle and lateral cuneiforms are somewhat irregular and difficult to fully characterize on this single series. Much of this irregularity is probably simply from arthropathy along the Lisfranc joint and postoperative findings. Ligamentous structures: Limited assessment due to the lack of images, no overt discontinuity along the lateral ligamentous complex or deltoid ligament. Musculotendinous: Extensor digitorum tenosynovitis. Common peroneus tendon sheath tenosynovitis noted with suspected longitudinal tearing of the peroneus brevis. Distal tibialis posterior tenosynovitis and tendinopathy. Achilles tendon intact. Plantar fascia poorly assessed. Soft tissues: Extensive subcutaneous edema circumferentially in the distal calf, mitigating somewhat in the ankle region.  Dorsal subcutaneous edema in the foot. IMPRESSION: 1. Limited assessment as the patient could only tolerate a single diagnostic series to be performed. This is a small subset of the normal MRI ankle exam. Reduced diagnostic sensitivity and specificity. 2. Interval resection of the second metatarsal. Possible arthropathy between the lateral cuneiform and the base of the third metatarsal. No definite osteomyelitis in the ankle region. 3. Longitudinal tear of the peroneus brevis with peroneus tenosynovitis. 4. Distal tibialis posterior tenosynovitis and tendinopathy. There is also extensor digitorum tenosynovitis. 5. Extensive subcutaneous edema circumferentially in the distal calf. Dorsal subcutaneous edema in the foot. Electronically Signed   By: Van Clines M.D.   On: 11/13/2020 07:33   DG Chest Port 1 View  Result Date: 11/13/2020 CLINICAL DATA:  Intubation EXAM: PORTABLE CHEST 1 VIEW COMPARISON:  Portable exam 1046 hours compared to 0907 hours FINDINGS: Tip of endotracheal tube is at carina directed toward RIGHT mainstem bronchus, recommend withdrawal 2-3 cm. Enlargement of cardiac silhouette with vascular congestion. RIGHT lung infiltrate slightly increased from previous exam. Mild RIGHT basilar atelectasis. No pleural effusion or pneumothorax. IMPRESSION: Tip of endotracheal tube is at carina, recommend withdrawal 2-3 cm. Enlargement of cardiac silhouette with pulmonary vascular congestion and increased RIGHT lung infiltrate. Findings called to patient's nurse Sarah RN in CCU on 11/13/2020 at 1110 hours. Electronically Signed   By: Lavonia Dana M.D.   On: 11/13/2020 11:21   DG Chest Port 1 View  Result Date: 11/13/2020 CLINICAL DATA:  Respiratory difficulty EXAM: PORTABLE CHEST 1 VIEW COMPARISON:  11/12/2020 FINDINGS: Heart size within normal limits. No pulmonary vascular congestion. Airspace opacity in the right lower lung likely due to pneumonitis. Lungs otherwise clear. No pneumothorax.  IMPRESSION: Right basilar airspace opacity likely due to pneumonitis. Electronically Signed   By: Miachel Roux M.D.   On: 11/13/2020 09:15   DG Chest [facility]  Result Date: 11/12/2020 CLINICAL DATA:  Wheezing EXAM: PORTABLE CHEST 1 VIEW COMPARISON:  Chest x-ray 11/09/2020 FINDINGS: The heart and mediastinal contours are within normal limits. Patchy right lung airspace and interstitial opacity. No pulmonary edema. No pleural effusion. No pneumothorax. No acute osseous abnormality. IMPRESSION: Patchy right lung airspace and interstitial airspace opacity that may represent infection/inflammation. Electronically Signed   By: Iven Finn M.D.   On: 11/12/2020 15:05   DG Abd Portable 1V  Result Date: 11/13/2020 CLINICAL DATA:  Enteric tube placement EXAM: PORTABLE ABDOMEN - 1 VIEW COMPARISON:  None. FINDINGS: Tip of enteric tube is seen in the region of body of stomach. Bowel gas pattern is nonspecific. Arterial calcifications are seen. Right margin of abdominal aorta is projecting to the right of lumbar spine. This may be due to tortuosity or aneurysmal dilation of abdominal aorta. Degenerative changes are noted with disc space narrowing, bony spurs and facet hypertrophy in the lumbar spine. IMPRESSION: Tip and side port of enteric tube are noted within the stomach. Nonspecific bowel gas pattern. Calcification in the abdominal aorta is projecting slightly to the right of lumbar spine which may be due to tortuosity or aneurysmal dilation. Electronically Signed   By: Elmer Picker M.D.   On: 11/13/2020 11:03        Scheduled Meds:  acetaminophen  650 mg Oral Once   allopurinol  200 mg Oral Daily   budesonide (PULMICORT) nebulizer solution  0.5 mg Nebulization BID   chlorhexidine gluconate (MEDLINE KIT)  15 mL Mouth Rinse BID   Chlorhexidine Gluconate Cloth  6 each Topical Daily   diltiazem  60 mg Per Tube Q6H   docusate  100 mg Per Tube BID   fentaNYL       ferrous gluconate  324 mg Oral  Q breakfast   insulin aspart  0-5 Units Subcutaneous QHS   insulin aspart  0-6 Units Subcutaneous TID WC   ipratropium-albuterol  3 mL Nebulization Q6H   levothyroxine  100 mcg Oral Q0600   mouth rinse  15 mL Mouth Rinse 10 times per day   metoprolol tartrate  25 mg Oral BID   multivitamin with minerals  1 tablet Oral Daily   nutrition supplement (JUVEN)  1 packet Oral BID BM   pantoprazole (PROTONIX) IV  40 mg Intravenous Daily   polyethylene glycol  17 g Per Tube Daily   Ensure Max Protein  11 oz Oral QHS   venlafaxine  100 mg Oral BID   Continuous Infusions:  sodium chloride Stopped (11/11/20 0850)   fentaNYL infusion INTRAVENOUS Stopped (11/13/20 1410)   heparin Stopped (11/13/20 1210)   piperacillin-tazobactam (ZOSYN)  IV 2.25 g (11/13/20 1700)   piperacillin-tazobactam (ZOSYN)  IV     propofol (DIPRIVAN) infusion 15 mcg/kg/min (11/13/20 1701)     LOS: 4 days    Time spent: 35 mins    Ethyn Schetter, MD [facility]   If 7PM-7AM, please contact night-coverage

## 2020-11-13 NOTE — Progress Notes (Signed)
Patient alert and oriented time 2. Able to tell me her name and birthday. Patient was responding to questions. Gave 2 pills by mouth to patient, patient started swallowing, then coughed and became unresponsive. Was able to remove medication from mouth. Notified charge, rapid response, and MD. New orders received.

## 2020-11-13 NOTE — Progress Notes (Signed)
   PODIATRY PROGRESS NOTE  NAME Autumn [name] MRN [id number] DOB [birth year] DOA 11/09/2020   CC: Diabetic foot infection LT Chief Complaint  Patient presents with   Fall     History of present illness: 70 y.o. female patient postop day 2 s/p Incision and drainage. 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 LT. DOS: 11/11/2020.  Interval history patient has been intubated and sedated due to neurologic events with change in mental status and unresponsiveness this morning with rapid response, she went for CT and brain MRI as well as ankle MRI this morning  Past Medical History:  Diagnosis Date   Diabetes mellitus without complication ([place])    Hypertension    Kidney stone    Thyroid disease     CBC Latest Ref Rng & Units 11/13/2020 11/12/2020 11/11/2020  WBC 4.0 - 10.5 K/uL 15.9(H) 15.9(H) 15.6(H)  Hemoglobin 12.0 - 15.0 g/dL 8.5(L) 7.0(L) 7.4(L)  Hematocrit 36.0 - 46.0 % 26.7(L) 21.9(L) 22.6(L)  Platelets 150 - 400 K/uL 220 189 185    BMP Latest Ref Rng & Units 11/13/2020 11/12/2020 11/11/2020  Glucose 70 - 99 mg/dL 117(H) 131(H) 133(H)  BUN 8 - 23 mg/dL 87(H) 76(H) 72(H)  Creatinine 0.44 - 1.00 mg/dL 4.13(H) 3.88(H) 3.65(H)  Sodium 135 - 145 mmol/L 132(L) 133(L) 132(L)  Potassium 3.5 - 5.1 mmol/L 4.1 4.5 4.0  Chloride 98 - 111 mmol/L 104 105 104  CO2 22 - 32 mmol/L 18(L) 14(L) 18(L)  Calcium 8.9 - 10.3 mg/dL 8.5(L) 8.7(L) 8.6(L)         Physical Exam: Please see above noted photo.  Sutures intact dorsal.  Packing throughout the amputation site left intact today.  No evidence of vascular compromise at the moment.  No purulence.  No malodor.  Moderate serosanguineous drainage.    ASSESSMENT/PLAN OF CARE 1.  S/P second ray amputation. I&D LT foot. DOS: 11/11/2020 -Dressings changed. - Will need to return to the OR at some point for further I&D, possible amputation but considering her neurologic changes and current status we can hold off on this until she is more stable medically - Repeat  MRI is fairly nondiagnostic.  Given that she is now sedated and intubated if she goes for any further MRI scans would recommend repeating MRI of the ankle.  At this point clinically I do not think that going just for that is worth it at this time but we will continue to decide this on a day-to-day basis -Continue IV antibiotics, ID is following and managing -Podiatry will continue to follow    Lanae Crumbly, DPM 11/13/2020     [address],  [address]                Office [phone]  Fax [phone]

## 2020-11-13 NOTE — Procedures (Signed)
Intubation Procedure Note  Autumn [name]  [id number]  June 24, 1950  Date:11/13/20  Time:10:30 AM   Provider Performing:Oneita Allmon D [name]    Procedure: Intubation (03159)  Indication(s) Respiratory Failure  Consent Unable to obtain consent due to emergent nature of procedure.   Anesthesia Etomidate, Fentanyl, and Rocuronium   Time Out Verified patient identification, verified procedure, site/side was marked, verified correct patient position, special equipment/implants available, medications/allergies/relevant history reviewed, required imaging and test results available.   Sterile Technique Usual hand hygeine, masks, and gloves were used   Procedure Description Patient positioned in bed supine.  Sedation given as noted above.  Patient was intubated with endotracheal tube using Glidescope.  View was Grade 1 full glottis .  Number of attempts was 1.  Colorimetric CO2 detector was consistent with tracheal placement.   Complications/Tolerance None; patient tolerated the procedure well. Chest X-ray is ordered to verify placement.   EBL Minimal   Specimen(s) None   Size 7.5 cm  Tube secured at 24 cm at lip.    Darel Hong, AGACNP-BC [facility] Prefer epic messenger for cross cover needs If after hours, please call E-link

## 2020-11-13 NOTE — Consult Note (Addendum)
NEURO HOSPITALIST CONSULT NOTE   Requesting physician: Dr. Mortimer Fries  Reason for Consult: Sudden onset of AMS  History obtained from:   Chart     HPI:                                                                                                                                          Autumn [name] is an 70 y.o. 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 She had been having multiple falls over the past week, hitting her head twice. She woke up on the floor the morning of admission, but was unable to recall how she got there. Patient was able to endorse that she had recent left foot surgery (diabetic ulcer) - her foot was red and swollen with a wound on the plantar aspect. She was drowsy but arousable to an awake state with orientation x 4. In the ED she was found to be in rapid a-fib, in the context of no known prior history of atiral fibrillation. She was diagnosed with sepsis and started on ABX. Labs showed worsened renal function.   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  There was also concern for an abscess and cellulitis. She was scheduled for surgery on 11/1.  This AM she coughed/choked with morning med pass and became minimally responsive with garbled speech.  STAT CT of her head was negative. On arrival to the ICU, she required emergent intubation for respiratory distress as well as for airway protection.    MRI brain revealed an acute to early subacute infarction in the left posterior frontal cortical and subcortical brain, possibly affecting the precentral gyrus. There was no mass effect or hemorrhage.  EEG was also obtained, revealing moderate diffuse slowing and occasional brief periods of  intervening suppression, both indicative of cerebral dysfunction, medication effect, or both.  Past Medical History:  Diagnosis Date   Diabetes mellitus without complication ([place])    Hypertension    Kidney stone    Thyroid disease     Past Surgical History:  Procedure Laterality Date   ABDOMINAL HYSTERECTOMY     AMPUTATION Left 11/11/2020   Procedure: AMPUTATION RAY - 2nd metatarsal and 3rd metatarsal head;  Surgeon: [name], DPM;  Location: [facility] ORS;  Service: Podiatry;  Laterality: Left;   URETERAL STENT PLACEMENT      Family History  Problem Relation Age of Onset   Hypertension Mother           Social History:  reports that she has quit smoking.  She has never used smokeless tobacco. She reports that she does not drink alcohol and does not use drugs.  Allergies  Allergen Reactions   Codeine Nausea And Vomiting    MEDICATIONS:                                                                                                                     Scheduled:  acetaminophen  650 mg Oral Once   allopurinol  200 mg Oral Daily   budesonide (PULMICORT) nebulizer solution  0.5 mg Nebulization BID   chlorhexidine gluconate (MEDLINE KIT)  15 mL Mouth Rinse BID   Chlorhexidine Gluconate Cloth  6 each Topical Daily   diltiazem  60 mg Per Tube Q6H   docusate  100 mg Per Tube BID   fentaNYL       ferrous gluconate  324 mg Oral Q breakfast   insulin aspart  0-5 Units Subcutaneous QHS   insulin aspart  0-6 Units Subcutaneous TID WC   ipratropium-albuterol  3 mL Nebulization Q6H   levothyroxine  100 mcg Oral Q0600   mouth rinse  15 mL Mouth Rinse 10 times per day   metoprolol tartrate  25 mg Oral BID   multivitamin with minerals  1 tablet Oral Daily   nutrition supplement (JUVEN)  1 packet Oral BID BM   pantoprazole (PROTONIX) IV  40 mg Intravenous Daily   polyethylene glycol  17 g Per Tube Daily   Ensure Max Protein  11 oz Oral QHS   venlafaxine  100 mg Oral BID    Continuous:  sodium chloride Stopped (11/11/20 0850)   fentaNYL infusion INTRAVENOUS 75 mcg/hr (11/13/20 1800)   piperacillin-tazobactam (ZOSYN)  IV     propofol (DIPRIVAN) infusion 15 mcg/kg/min (11/13/20 1800)     ROS:                                                                                                                                       Unable to obtain due to AMS and intubation.    Blood pressure 127/86, pulse (!) 113, temperature 98.6 F (37 C), temperature source Oral, resp. rate 14, height '5\' 6"'  (1.676 m), weight 86.2 kg, SpO2 95 %.   General Examination:  Physical Exam  HEENT-  [place]/AT  Lungs- Intubated Extremities- Foot dressing and pretibial swelling with redness on the left.    Neurological Examination Mental Status: Performed after being off propofol and fentanyl drips for 30 minutes.  Will open eyes to noxious stimuli. Does not follow any commands. No attempts to communicate. Does not gaze towards or away from visual stimuli. Becomes agitated and will furrow brow, open eyes wider and move LUE semipurposefully to noxious.  Cranial Nerves: II: Pupils briefly asymmetric, 4 mm right  and 3 mm left, then became symmetric at 3 mm in ambient light and contracted to 2 mm equally with penlight. No blink to threat. Does not fixate or track.   III,IV, VI: Eyes are conjugate at the midline. Partially suppressed oculocephalic reflex. Does not gaze towards or away from visual stimuli.  V,VII: Corneal reflexes present bilaterally. Face symmetric in the context of intubation.  VIII: No response to auditory stimuli.  IX,X: Intubated XI: Head is midline.  XII: Unable to assess Motor/Sensory: Flaccid tone x 4. Moves LUE semipurposefully towards chest antigravity with forearm, during sternal ruby. Does not follow commands for testing strength against resistance. Moves right  hand at wrist to noxious, otherwise with slight arm movement more proximally but does not lift off the bed.  BLE with flaccid tone. Flexes LLE > RLE to thigh pinch on each side, after a delay, but does not lift antigravity.  Deep Tendon Reflexes: Symmetric 2+ reflexes to brachioradialis and patellae. Right toe upgoing. Unable to assess left Babinski.  Cerebellar/Gait: Unable to assess   Lab Results: Basic Metabolic Panel: Recent Labs  Lab 11/09/20 1220 11/09/20 1818 11/10/20 0649 11/11/20 0636 11/12/20 0540 11/13/20 0203  NA 132*  --  132* 132* 133* 132*  K 3.2*  --  3.5 4.0 4.5 4.1  CL 102  --  103 104 105 104  CO2 18*  --  18* 18* 14* 18*  GLUCOSE 164*  --  152* 133* 131* 117*  BUN 69*  --  64* 72* 76* 87*  CREATININE 3.65*  --  3.50* 3.65* 3.88* 4.13*  CALCIUM 8.4*  --  8.2* 8.6* 8.7* 8.5*  MG  --  2.0  --   --   --   --     CBC: Recent Labs  Lab 11/09/20 1220 11/10/20 0649 11/11/20 0636 11/12/20 0540 11/13/20 0203  WBC 15.8* 13.5* 15.6* 15.9* 15.9*  NEUTROABS 13.5*  --   --   --   --   HGB 8.0* 7.2* 7.4* 7.0* 8.5*  HCT 24.5* 22.3* 22.6* 21.9* 26.7*  MCV 86.3 85.4 86.3 87.6 85.3  PLT 190 180 185 189 220    Cardiac Enzymes: Recent Labs  Lab 11/09/20 1818  CKTOTAL 184    Lipid Panel: Recent Labs  Lab 11/10/20 1147  CHOL 101  TRIG 149  HDL 21*  CHOLHDL 4.8  VLDL 30  LDLCALC 50    Imaging: CT HEAD WO CONTRAST (5MM)  Result Date: 11/13/2020 CLINICAL DATA:  Mental status changes EXAM: CT HEAD WITHOUT CONTRAST TECHNIQUE: Contiguous axial images were obtained from the base of the skull through the vertex without intravenous contrast. COMPARISON:  11/09/2020 FINDINGS: Brain: stable atrophy pattern and chronic white matter microvascular ischemic changes throughout both cerebral hemispheres. Limited with some motion artifact. No acute intracranial hemorrhage, mass lesion, new infarction, midline shift, herniation, hydrocephalus, or extra-axial fluid collection.  No focal mass effect or edema. Cisterns are patent. Cerebellar atrophy as well. Vascular: Intracranial atherosclerosis at  the skull base. No hyperdense vessel. Skull: Normal. Negative for fracture or focal lesion. Sinuses/Orbits: No acute finding. Other: None. IMPRESSION: Stable atrophy and white matter microvascular ischemic changes. No acute intracranial abnormality by noncontrast CT. Electronically Signed   By: Jerilynn Mages.  Shick M.D.   On: 11/13/2020 09:58   MR ANKLE LEFT WO CONTRAST  Result Date: 11/13/2020 CLINICAL DATA:  Suspected osteomyelitis of the ankle. Recent diagnosis of osteomyelitis involving the second metatarsal and proximal phalanx. EXAM: MRI OF THE LEFT ANKLE WITHOUT CONTRAST TECHNIQUE: Multiplanar, multisequence MR imaging of the ankle was performed. No intravenous contrast was administered. COMPARISON:  Radiographs and MRI of 11/09/2020 FINDINGS: The patient had great difficulty remaining stationary for today's exam. Only a single diagnostic series could be obtained. Osseous structures: Interval resection of the second metatarsal on 11/11/2020, with expected regional postoperative findings. It is difficult on the single series provided to get a good sense of the base of the third metatarsal and its articulation with the lateral cuneiform, there is potentially some accentuated fluid signal and irregularity the articulation of the lateral cuneiform and the base of the third metatarsal. No discrete edema signal to suggest osteomyelitis involving the distal tibia/fibula, calcaneus, cuboid, talus, navicular, or medial cuneiform. The middle and lateral cuneiforms are somewhat irregular and difficult to fully characterize on this single series. Much of this irregularity is probably simply from arthropathy along the Lisfranc joint and postoperative findings. Ligamentous structures: Limited assessment due to the lack of images, no overt discontinuity along the lateral ligamentous complex or deltoid ligament.  Musculotendinous: Extensor digitorum tenosynovitis. Common peroneus tendon sheath tenosynovitis noted with suspected longitudinal tearing of the peroneus brevis. Distal tibialis posterior tenosynovitis and tendinopathy. Achilles tendon intact. Plantar fascia poorly assessed. Soft tissues: Extensive subcutaneous edema circumferentially in the distal calf, mitigating somewhat in the ankle region. Dorsal subcutaneous edema in the foot. IMPRESSION: 1. Limited assessment as the patient could only tolerate a single diagnostic series to be performed. This is a small subset of the normal MRI ankle exam. Reduced diagnostic sensitivity and specificity. 2. Interval resection of the second metatarsal. Possible arthropathy between the lateral cuneiform and the base of the third metatarsal. No definite osteomyelitis in the ankle region. 3. Longitudinal tear of the peroneus brevis with peroneus tenosynovitis. 4. Distal tibialis posterior tenosynovitis and tendinopathy. There is also extensor digitorum tenosynovitis. 5. Extensive subcutaneous edema circumferentially in the distal calf. Dorsal subcutaneous edema in the foot. Electronically Signed   By: Van Clines M.D.   On: 11/13/2020 07:33   DG Chest Port 1 View  Result Date: 11/13/2020 CLINICAL DATA:  Intubation EXAM: PORTABLE CHEST 1 VIEW COMPARISON:  Portable exam 1046 hours compared to 0907 hours FINDINGS: Tip of endotracheal tube is at carina directed toward RIGHT mainstem bronchus, recommend withdrawal 2-3 cm. Enlargement of cardiac silhouette with vascular congestion. RIGHT lung infiltrate slightly increased from previous exam. Mild RIGHT basilar atelectasis. No pleural effusion or pneumothorax. IMPRESSION: Tip of endotracheal tube is at carina, recommend withdrawal 2-3 cm. Enlargement of cardiac silhouette with pulmonary vascular congestion and increased RIGHT lung infiltrate. Findings called to patient's nurse Sarah RN in CCU on 11/13/2020 at 1110 hours.  Electronically Signed   By: Lavonia Dana M.D.   On: 11/13/2020 11:21   DG Chest Port 1 View  Result Date: 11/13/2020 CLINICAL DATA:  Respiratory difficulty EXAM: PORTABLE CHEST 1 VIEW COMPARISON:  11/12/2020 FINDINGS: Heart size within normal limits. No pulmonary vascular congestion. Airspace opacity in the right lower lung likely due to  pneumonitis. Lungs otherwise clear. No pneumothorax. IMPRESSION: Right basilar airspace opacity likely due to pneumonitis. Electronically Signed   By: Miachel Roux M.D.   On: 11/13/2020 09:15   DG Chest Port 1 View  Result Date: 11/12/2020 CLINICAL DATA:  Wheezing EXAM: PORTABLE CHEST 1 VIEW COMPARISON:  Chest x-ray 11/09/2020 FINDINGS: The heart and mediastinal contours are within normal limits. Patchy right lung airspace and interstitial opacity. No pulmonary edema. No pleural effusion. No pneumothorax. No acute osseous abnormality. IMPRESSION: Patchy right lung airspace and interstitial airspace opacity that may represent infection/inflammation. Electronically Signed   By: Iven Finn M.D.   On: 11/12/2020 15:05   DG Abd Portable 1V  Result Date: 11/13/2020 CLINICAL DATA:  Enteric tube placement EXAM: PORTABLE ABDOMEN - 1 VIEW COMPARISON:  None. FINDINGS: Tip of enteric tube is seen in the region of body of stomach. Bowel gas pattern is nonspecific. Arterial calcifications are seen. Right margin of abdominal aorta is projecting to the right of lumbar spine. This may be due to tortuosity or aneurysmal dilation of abdominal aorta. Degenerative changes are noted with disc space narrowing, bony spurs and facet hypertrophy in the lumbar spine. IMPRESSION: Tip and side port of enteric tube are noted within the stomach. Nonspecific bowel gas pattern. Calcification in the abdominal aorta is projecting slightly to the right of lumbar spine which may be due to tortuosity or aneurysmal dilation. Electronically Signed   By: Elmer Picker M.D.   On: 11/13/2020 11:03      Assessment: 70 year old female with acute onset of AMS this AM following a choking episode after which she was intubated for respiratory distress and airway protection.  1. Exam localizes as diffuse cerebral dysfunction in addition to decreased movement on the right relative to the left localizing as a potential left hemispheric stroke, which is confirmed by MRI.  2. MRI brain reveals an acute to subacute infarction affecting the left posterior frontal cortical and subcortical brain, potentially the precentral gyrus. Otherwise, there are chronic small-vessel ischemic changes of the pons and cerebral hemispheres. A few punctate foci of chronic-appearing hemosiderin deposition are seen in the pons and cerebral hemispheres. 3. EEG with moderate diffuse slowing and occasional brief periods of intervening suppression, both indicative of cerebral dysfunction, medication effect, or both. No electrographic seizures or epileptiform discharges seen.   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 Stroke most likely peri-procedural versus due to her atrial fibrillation this admission. Given the likely etiology, doubt acute neck or head vessel imaging will change management, but could consider when she is more stable.  - Diffuse encephalopathy, most likely multifactorial in the setting of sepsis, liver disease and renal dysfunction  Recommendations: 1. Heparin gtt was put on hold pending MRI and Neurology evaluation. Based on the small size of the acute stroke and chronic appearance of the small number of microhemorrhages seen on MRI, the benefits of restarting heparin most likely outweigh the slight risk of hemorrhagic conversion or hypertensive hemorrhage.  2. Management of her sepsis and other medical conditions per ICU team.  3. Minimize sedation if possible. She may be able to achieve appropriate sedation with a lower propofol gtt rate of 5 if she becomes agitated again, but  she was doing well without significant agitation 30 minutes after propofol was stopped for neurology exam.  4. Given the likely etiology, doubt acute neck or head vessel imaging will change management, but could consider when she is more stable. 5. Please call  the Neurohospitalist service if there are additional questions.   45 minutes spent in the neurological evaluation and management of this critically ill patient.     Electronically signed: Dr. Kerney Elbe 11/13/2020, 12:17 PM

## 2020-11-13 NOTE — Progress Notes (Signed)
RT assisted with patient transport to MRI while patient on the trilogy ventilator. No complications arose during trip.

## 2020-11-14 ENCOUNTER — Encounter: Payer: Self-pay | Admitting: Internal Medicine

## 2020-11-14 ENCOUNTER — Inpatient Hospital Stay: Payer: Medicare (Managed Care)

## 2020-11-14 DIAGNOSIS — N179 Acute kidney failure, unspecified: Secondary | ICD-10-CM | POA: Diagnosis not present

## 2020-11-14 DIAGNOSIS — L02619 Cutaneous abscess of unspecified foot: Secondary | ICD-10-CM

## 2020-11-14 DIAGNOSIS — I639 Cerebral infarction, unspecified: Secondary | ICD-10-CM | POA: Diagnosis not present

## 2020-11-14 DIAGNOSIS — K7581 Nonalcoholic steatohepatitis (NASH): Secondary | ICD-10-CM

## 2020-11-14 DIAGNOSIS — I4891 Unspecified atrial fibrillation: Secondary | ICD-10-CM | POA: Diagnosis not present

## 2020-11-14 DIAGNOSIS — L03119 Cellulitis of unspecified part of limb: Secondary | ICD-10-CM

## 2020-11-14 DIAGNOSIS — R296 Repeated falls: Secondary | ICD-10-CM

## 2020-11-14 DIAGNOSIS — R0603 Acute respiratory distress: Secondary | ICD-10-CM

## 2020-11-14 DIAGNOSIS — J9601 Acute respiratory failure with hypoxia: Secondary | ICD-10-CM | POA: Diagnosis not present

## 2020-11-14 DIAGNOSIS — M86172 Other acute osteomyelitis, left ankle and foot: Secondary | ICD-10-CM | POA: Diagnosis not present

## 2020-11-14 DIAGNOSIS — K7460 Unspecified cirrhosis of liver: Secondary | ICD-10-CM

## 2020-11-14 LAB — CBC
HCT: 26.7 % — ABNORMAL LOW (ref 36.0–46.0)
Hemoglobin: 8.5 g/dL — ABNORMAL LOW (ref 12.0–15.0)
MCH: 27.7 pg (ref 26.0–34.0)
MCHC: 31.8 g/dL (ref 30.0–36.0)
MCV: 87 fL (ref 80.0–100.0)
Platelets: 195 10*3/uL (ref 150–400)
RBC: 3.07 MIL/uL — ABNORMAL LOW (ref 3.87–5.11)
RDW: 15.9 % — ABNORMAL HIGH (ref 11.5–15.5)
WBC: 14.5 10*3/uL — ABNORMAL HIGH (ref 4.0–10.5)
nRBC: 0.2 % (ref 0.0–0.2)

## 2020-11-14 LAB — BLOOD GAS, ARTERIAL
Acid-base deficit: 9.7 mmol/L — ABNORMAL HIGH (ref 0.0–2.0)
Bicarbonate: 16.6 mmol/L — ABNORMAL LOW (ref 20.0–28.0)
FIO2: 0.4
MECHVT: 450 mL
O2 Saturation: 96.4 %
PEEP: 5 cmH2O
Patient temperature: 37
pCO2 arterial: 37 mmHg (ref 32.0–48.0)
pH, Arterial: 7.26 — ABNORMAL LOW (ref 7.350–7.450)
pO2, Arterial: 97 mmHg (ref 83.0–108.0)

## 2020-11-14 LAB — RENAL FUNCTION PANEL
Albumin: 2.2 g/dL — ABNORMAL LOW (ref 3.5–5.0)
Anion gap: 13 (ref 5–15)
BUN: 85 mg/dL — ABNORMAL HIGH (ref 8–23)
CO2: 16 mmol/L — ABNORMAL LOW (ref 22–32)
Calcium: 8.9 mg/dL (ref 8.9–10.3)
Chloride: 104 mmol/L (ref 98–111)
Creatinine, Ser: 4.91 mg/dL — ABNORMAL HIGH (ref 0.44–1.00)
GFR, Estimated: 9 mL/min — ABNORMAL LOW (ref >60)
Glucose, Bld: 183 mg/dL — ABNORMAL HIGH (ref 70–99)
Phosphorus: 7.8 mg/dL — ABNORMAL HIGH (ref 2.5–4.6)
Potassium: 4.5 mmol/L (ref 3.5–5.1)
Sodium: 133 mmol/L — ABNORMAL LOW (ref 135–145)

## 2020-11-14 LAB — TRIGLYCERIDES: Triglycerides: 213 mg/dL — ABNORMAL HIGH (ref <150)

## 2020-11-14 LAB — GLUCOSE, CAPILLARY
Glucose-Capillary: 165 mg/dL — ABNORMAL HIGH (ref 70–99)
Glucose-Capillary: 161 mg/dL — ABNORMAL HIGH (ref 70–99)
Glucose-Capillary: 173 mg/dL — ABNORMAL HIGH (ref 70–99)
Glucose-Capillary: 147 mg/dL — ABNORMAL HIGH (ref 70–99)
Glucose-Capillary: 156 mg/dL — ABNORMAL HIGH (ref 70–99)
Glucose-Capillary: 140 mg/dL — ABNORMAL HIGH (ref 70–99)

## 2020-11-14 MED ORDER — SODIUM CHLORIDE 0.9 % IV SOLN
3.0000 g | Freq: Two times a day (BID) | INTRAVENOUS | Status: DC
  Administered 2020-11-14 – 2020-12-08 (×46): 3 g via INTRAVENOUS
  Filled 2020-11-14: qty 8
  Filled 2020-11-14: qty 3
  Filled 2020-11-14 (×2): qty 8
  Filled 2020-11-14 (×3): qty 3
  Filled 2020-11-14 (×2): qty 8
  Filled 2020-11-14 (×2): qty 3
  Filled 2020-11-14: qty 8
  Filled 2020-11-14: qty 3
  Filled 2020-11-14: qty 8
  Filled 2020-11-14: qty 3
  Filled 2020-11-14: qty 8
  Filled 2020-11-14: qty 3
  Filled 2020-11-14 (×3): qty 8
  Filled 2020-11-14 (×3): qty 3
  Filled 2020-11-14 (×4): qty 8
  Filled 2020-11-14: qty 3
  Filled 2020-11-14 (×3): qty 8
  Filled 2020-11-14: qty 3
  Filled 2020-11-14 (×2): qty 8
  Filled 2020-11-14 (×8): qty 3
  Filled 2020-11-14: qty 8
  Filled 2020-11-14 (×2): qty 3
  Filled 2020-11-14: qty 8
  Filled 2020-11-14: qty 3
  Filled 2020-11-14 (×2): qty 8
  Filled 2020-11-14: qty 3
  Filled 2020-11-14: qty 8
  Filled 2020-11-14: qty 3
  Filled 2020-11-14: qty 8
  Filled 2020-11-14: qty 3

## 2020-11-14 MED ORDER — PIPERACILLIN-TAZOBACTAM IN DEX 2-0.25 GM/50ML IV SOLN
2.2500 g | Freq: Three times a day (TID) | INTRAVENOUS | Status: DC
  Administered 2020-11-14 (×2): 2.25 g via INTRAVENOUS
  Filled 2020-11-14 (×4): qty 50

## 2020-11-14 MED ORDER — ORAL CARE MOUTH RINSE
15.0000 mL | Freq: Two times a day (BID) | OROMUCOSAL | Status: DC
  Administered 2020-11-14 – 2020-12-21 (×63): 15 mL via OROMUCOSAL

## 2020-11-14 MED ORDER — SODIUM BICARBONATE 8.4 % IV SOLN
INTRAVENOUS | Status: DC
  Filled 2020-11-14: qty 1000
  Filled 2020-11-14 (×2): qty 150
  Filled 2020-11-14 (×2): qty 1000
  Filled 2020-11-14: qty 150
  Filled 2020-11-14 (×5): qty 1000

## 2020-11-14 NOTE — Progress Notes (Signed)
Patient Update:   Patient tolerated SBT and sedation wean this am, following simple commands with noted right UE weakness. She was extubated to 5 liters per minute oxygen supplementation via nasal cannula.   Reassessed post extubation. Patient is awake, alert, sitting up in bed with stable vital signs, O2 sats 97% on 5lpm O2 [place]. 4/5 grip strength left UE, 0/5 grip strength right UE. Able to drag right arm with effort using body strength but cannot hold right arm up to gravity on command and not squeezing hand. Noted right facial droop post extubation. Updated patient's friend [name] in detail, and all questions answered at bedside with patient present. Patient is not able to speak at this time but appears to understand update of MRI findings to her friend at bedside; patient's facial expression is consistent with being upset/tearful.   Updated Plan:  - Will continue oxygen supplementation via nasal cannula; wean as tol maintain O2 sats > 93%.  - Patient is on IV Zosyn per ID recs for sepsis secondary to osteomyelitis/cellulitis of left foot/ankle. Coverage appropriate for possible aspiration pneumonia seen on CXR 11/2. Will follow tracheal aspirate culture and monitor respiratory status closely.  - NPO for now. High risk of aspiration given recent stroke findings. Bedside suction prn for secretions. Encourage patient to cough oral secretions prn. Will get SLP eval for speech/swallow.  - PT/OT eval if remains hemodynamically stable post extubation - Neurology to follow. Reviewed consultation yesterday; noted neuro recommendations to resume continuous heparin infusion with benefits of restarting outweighing risk of hemorrhagic conversion or hypertensive hemorrhage. Will continue to hold off on restarting heparin at this time.        Enid Cutter, PA-S [place] DPAS

## 2020-11-14 NOTE — Progress Notes (Signed)
Date of Admission:  11/09/2020   T   ID: MAZIAH SMOLA is a 70 y.o. female  Principal Problem:   Sepsis ([place]) Active Problems:   Atrial fibrillation with RVR ([place])   Osteomyelitis of foot, left, acute ([place])   CKD stage 4 due to type 2 diabetes mellitus ([place])   Liver cirrhosis secondary to NASH (nonalcoholic steatohepatitis) ([place])   Hypertension   History of anemia due to CKD   Frequent falls   AKI (acute kidney injury) ([place])   Cellulitis and abscess of foot, except toes   Infectious tenosynovitis   [place]   Atrial fibrillation with rapid ventricular response ([facility])    Subjective: Pt has been extubated and on nasal cannula Agitated as she cannot talk well Not moving rt arm  Medications:   acetaminophen  650 mg Oral Once   allopurinol  200 mg Oral Daily   budesonide (PULMICORT) nebulizer solution  0.5 mg Nebulization BID   Chlorhexidine Gluconate Cloth  6 each Topical Daily   diltiazem  60 mg Per Tube Q6H   docusate  100 mg Per Tube BID   ferrous gluconate  324 mg Oral Q breakfast   insulin aspart  0-6 Units Subcutaneous Q4H   ipratropium-albuterol  3 mL Nebulization Q6H   levothyroxine  100 mcg Oral Q0600   mouth rinse  15 mL Mouth Rinse BID   metoprolol tartrate  25 mg Oral BID   multivitamin with minerals  1 tablet Oral Daily   nutrition supplement (JUVEN)  1 packet Oral BID BM   pantoprazole (PROTONIX) IV  40 mg Intravenous Daily   polyethylene glycol  17 g Per Tube Daily   Ensure Max Protein  11 oz Oral QHS   venlafaxine  100 mg Oral BID    Objective: Vital signs in last 24 hours: Temp:  [96.8 F (36 C)-98.1 F (36.7 C)] 97.9 F (36.6 C) (11/03 2000) Pulse Rate:  [25-131] 99 (11/03 2000) Resp:  [10-18] 16 (11/03 2000) BP: (94-127)/(58-102) 127/80 (11/03 1900) SpO2:  [90 %-99 %] 96 % (11/03 2000) FiO2 (%):  [30 %-40 %] 30 % (11/03 0800)  PHYSICAL EXAM:  General: opens eyes and tries to talk  Lungs: b/l air entry- decreased in the bases Heart:  irregular. Abdomen: Soft, non-tender,not distended. Bowel sounds normal. No masses Extremities: left foot dressing     Skin: No rashes or lesions. Or bruising Lymph: Cervical, supraclavicular normal. Neurologic: rt arm - unable to move  Lab Results Recent Labs    11/13/20 0203 11/14/20 0526  WBC 15.9* 14.5*  HGB 8.5* 8.5*  HCT 26.7* 26.7*  NA 132* 133*  K 4.1 4.5  CL 104 104  CO2 18* 16*  BUN 87* 85*  CREATININE 4.13* 4.91*   Liver Panel Recent Labs    11/14/20 0526  ALBUMIN 2.2*   Sedimentation Rate No results for input(s): ESRSEDRATE in the last 72 hours. C-Reactive Protein No results for input(s): CRP in the last 72 hours.  Microbiology: MRSA nares  not detected 11/13/20 BC Foot abscess culture Pasteurella, rare staph and strep mitis Studies/Results: CT HEAD WO CONTRAST (5MM)  Result Date: 11/13/2020 CLINICAL DATA:  Mental status changes EXAM: CT HEAD WITHOUT CONTRAST TECHNIQUE: Contiguous axial images were obtained from the base of the skull through the vertex without intravenous contrast. COMPARISON:  11/09/2020 FINDINGS: Brain: stable atrophy pattern and chronic white matter microvascular ischemic changes throughout both cerebral hemispheres. Limited with some motion artifact. No acute intracranial hemorrhage, mass lesion,  new infarction, midline shift, herniation, hydrocephalus, or extra-axial fluid collection. No focal mass effect or edema. Cisterns are patent. Cerebellar atrophy as well. Vascular: Intracranial atherosclerosis at the skull base. No hyperdense vessel. Skull: Normal. Negative for fracture or focal lesion. Sinuses/Orbits: No acute finding. Other: None. IMPRESSION: Stable atrophy and white matter microvascular ischemic changes. No acute intracranial abnormality by noncontrast CT. Electronically Signed   By: Jerilynn Mages.  Shick M.D.   On: 11/13/2020 09:58   MR BRAIN WO CONTRAST  Result Date: 11/13/2020 CLINICAL DATA:  Mental status changes of unknown cause.  Recent fall at home. EXAM: MRI HEAD WITHOUT CONTRAST TECHNIQUE: Multiplanar, multiecho pulse sequences of the brain and surrounding structures were obtained without intravenous contrast. COMPARISON:  Head CT 11/13/2020 FINDINGS: Brain: Diffusion imaging shows an acute to subacute infarction affecting the left posterior frontal cortical and subcortical brain, potentially the precentral gyrus. No other acute finding. No sign of hemorrhage or mass effect associated with that. Otherwise, there chronic small-vessel ischemic changes of the pons. Cerebral hemispheres show moderate chronic small-vessel ischemic changes of the white matter. No mass lesion, hemorrhage, hydrocephalus or extra-axial collection. Few punctate foci of hemosiderin deposition noted in the pons and cerebral hemispheres. Vascular: Major vessels at the base of the brain show flow. 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 No mass effect or hemorrhage. Chronic small-vessel ischemic changes elsewhere affecting the brainstem and cerebral hemispheric white matter, some associated with chronic punctate hemosiderin deposition. Electronically Signed   By: Nelson Chimes M.D.   On: 11/13/2020 16:36   MR ANKLE LEFT WO CONTRAST  Result Date: 11/13/2020 CLINICAL DATA:  Suspected osteomyelitis of the ankle. Recent diagnosis of osteomyelitis involving the second metatarsal and proximal phalanx. EXAM: MRI OF THE LEFT ANKLE WITHOUT CONTRAST TECHNIQUE: Multiplanar, multisequence MR imaging of the ankle was performed. No intravenous contrast was administered. COMPARISON:  Radiographs and MRI of 11/09/2020 FINDINGS: The patient had great difficulty remaining stationary for today's exam. Only a single diagnostic series could be obtained. Osseous structures: Interval resection of the  second metatarsal on 11/11/2020, with expected regional postoperative findings. It is difficult on the single series provided to get a good sense of the base of the third metatarsal and its articulation with the lateral cuneiform, there is potentially some accentuated fluid signal and irregularity the articulation of the lateral cuneiform and the base of the third metatarsal. No discrete edema signal to suggest osteomyelitis involving the distal tibia/fibula, calcaneus, cuboid, talus, navicular, or medial cuneiform. The middle and lateral cuneiforms are somewhat irregular and difficult to fully characterize on this single series. Much of this irregularity is probably simply from arthropathy along the Lisfranc joint and postoperative findings. Ligamentous structures: Limited assessment due to the lack of images, no overt discontinuity along the lateral ligamentous complex or deltoid ligament. Musculotendinous: Extensor digitorum tenosynovitis. Common peroneus tendon sheath tenosynovitis noted with suspected longitudinal tearing of the peroneus brevis. Distal tibialis posterior tenosynovitis and tendinopathy. Achilles tendon intact. Plantar fascia poorly assessed. Soft tissues: Extensive subcutaneous edema circumferentially in the distal calf, mitigating somewhat in the ankle region. Dorsal subcutaneous edema in the foot. IMPRESSION: 1. Limited assessment as the patient could only tolerate a single diagnostic series to be performed. This is a small subset of the normal MRI ankle exam. Reduced diagnostic sensitivity and specificity. 2. Interval resection of the second metatarsal. Possible arthropathy between the lateral  cuneiform and the base of the third metatarsal. No definite osteomyelitis in the ankle region. 3. Longitudinal tear of the peroneus brevis with peroneus tenosynovitis. 4. Distal tibialis posterior tenosynovitis and tendinopathy. There is also extensor digitorum tenosynovitis. 5. Extensive subcutaneous  edema circumferentially in the distal calf. Dorsal subcutaneous edema in the foot. Electronically Signed   By: Van Clines M.D.   On: 11/13/2020 07:33   DG Chest Port 1 View  Result Date: 11/14/2020 CLINICAL DATA:  Acute respiratory failure EXAM: PORTABLE CHEST 1 VIEW COMPARISON:  Previous day FINDINGS: Endotracheal tube terminates approximately 1 cm above the carina. Esophageal temperature probe is present. There is enteric tube which terminates below the level of the diaphragm with the distal tip not visualized. The heart is enlarged. Stable appearance of widened vascular pedicle. Left the perihilar and interstitial opacities are present bilaterally. There is increased consolidative opacity in the right lower lung. Small right pleural effusion has increased in size. No acute osseous abnormality. IMPRESSION: 1. Support lines and tubes as described. The endotracheal tube appears to terminate approximately 1 cm above the carina. 2. Cardiomegaly with perihilar and interstitial opacities compatible with pulmonary edema. Small right pleural effusion has increased in size. 3. Evolving opacity in the right lung base is more conspicuous, and raises question for superimposed infection. Attention on follow-up films. Electronically Signed   By: Albin Felling M.D.   On: 11/14/2020 08:03   DG Chest Port 1 View  Result Date: 11/13/2020 CLINICAL DATA:  Intubation EXAM: PORTABLE CHEST 1 VIEW COMPARISON:  Portable exam 1046 hours compared to 0907 hours FINDINGS: Tip of endotracheal tube is at carina directed toward RIGHT mainstem bronchus, recommend withdrawal 2-3 cm. Enlargement of cardiac silhouette with vascular congestion. RIGHT lung infiltrate slightly increased from previous exam. Mild RIGHT basilar atelectasis. No pleural effusion or pneumothorax. IMPRESSION: Tip of endotracheal tube is at carina, recommend withdrawal 2-3 cm. Enlargement of cardiac silhouette with pulmonary vascular congestion and increased  RIGHT lung infiltrate. Findings called to patient's nurse Sarah RN in CCU on 11/13/2020 at 1110 hours. Electronically Signed   By: Lavonia Dana M.D.   On: 11/13/2020 11:21   DG Chest Port 1 View  Result Date: 11/13/2020 CLINICAL DATA:  Respiratory difficulty EXAM: PORTABLE CHEST 1 VIEW COMPARISON:  11/12/2020 FINDINGS: Heart size within normal limits. No pulmonary vascular congestion. Airspace opacity in the right lower lung likely due to pneumonitis. Lungs otherwise clear. No pneumothorax. IMPRESSION: Right basilar airspace opacity likely due to pneumonitis. Electronically Signed   By: Miachel Roux M.D.   On: 11/13/2020 09:15   DG Abd Portable 1V  Result Date: 11/13/2020 CLINICAL DATA:  Enteric tube placement EXAM: PORTABLE ABDOMEN - 1 VIEW COMPARISON:  None. FINDINGS: Tip of enteric tube is seen in the region of body of stomach. Bowel gas pattern is nonspecific. Arterial calcifications are seen. Right margin of abdominal aorta is projecting to the right of lumbar spine. This may be due to tortuosity or aneurysmal dilation of abdominal aorta. Degenerative changes are noted with disc space narrowing, bony spurs and facet hypertrophy in the lumbar spine. IMPRESSION: Tip and side port of enteric tube are noted within the stomach. Nonspecific bowel gas pattern. Calcification in the abdominal aorta is projecting slightly to the right of lumbar spine which may be due to tortuosity or aneurysmal dilation. Electronically Signed   By: Elmer Picker M.D.   On: 11/13/2020 11:03   EEG adult  Result Date: 11/13/2020 Derek Jack, MD  11/13/2020  6:59 PM Routine EEG Report ADYA WIRZ is a 70 y.o. female with a history of encephalopathy who is undergoing an EEG to evaluate for seizures. Report: This EEG was acquired with electrodes placed according to the International 10-20 electrode system (including Fp1, Fp2, F3, F4, C3, C4, P3, P4, O1, O2, T3, T4, T5, T6, A1, A2, Fz, Cz, Pz). The following  electrodes were missing or displaced: none. The best background was 5-6 Hz. This activity was near-continuous although occasionally 1-2 seconds of intervening diffuse suppression is noted. This activity is reactive to stimulation. Drowsiness was manifested by background fragmentation; deeper stages of sleep were identified by K complexes and sleep spindles. There was no focal slowing. There were no interictal epileptiform discharges. There were no electrographic seizures identified. Photic stimulation and hyperventilation were not performed. Impression and clinical correlation: This EEG was obtained while sedated and asleep and is abnormal due to moderate diffuse slowing and occasional brief periods of intervening suppression both indicative of cerebral dysfunction, medication effect, or both. Su Monks, MD [facility] [phone] If 7pm- 7am, please page neurology on call as listed in [place].     Assessment/Plan:  Resp distress - was intubated, now extubated Altered mental status due to CVA MRI shows Acute to early subacute infarction in the left posterior frontal cortical and subcortical brain, possibly affecting the precentral gyrus. Has rt hemiparesis and expressive aphasia  Diabetes mellitus with Left foot ulcer with infection and sepsis ?chronic ulcer but it is infected now- underwent I/D and 2nd ray excision polymicrobial On zosyn Culture MSSA, pasteurella, strep mitis So can change zosyn to unasyn    Falls- unclear etiology   Severe anemia- got blood transfusion  AKI on CKD- worsening Avoid nephrotoxic drugs   DM    NASH leading to cirrhosis Rt heart failure   Hypothyroidism on synthroid-     Discussed the management with care team

## 2020-11-14 NOTE — Progress Notes (Signed)
NAME:  Autumn [name], MRN:  [id number], DOB:  [birth year], LOS: 73 ADMISSION DATE:  11/09/2020 CONSULTATION DATE:  11/13/20 REFERRING MD:  Dr Leslye Peer CHIEF COMPLAINT:  Fall  70 yo F admitted 10/29 s/p falls at home, worsening left foot chronic diabetic ulcer with osteomyelitis 2nd+3rd metatarsals and cellulitis noted on MRI, s/p L foot amputation of the 2nd metatarsal and toe, I&D deep abscess multiple fascial planes, and bone biopsy open deep third metatarsal on 10/31. Other comorbidities include sepsis without shock secondary to osteomyelitis and cellulitis left foot (IV zosyn), AKI on CKD, rapid afib (Cardizem + Heparin gtt), NSTEMI suspect demand ischemia, anemia acute on chronic. Transferred to ICU and intubated 11/2 after evolving neuro changes with unresponsiveness after coughing episode; post rapid response and code stroke. Patient is being followed by cardiology, nephrology, podiatry, and infectious disease. 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  History of Present Illness:  At time of consultation, patient is unable to provide history of present illness, review of systems, or subjective information. All information below was obtained by patient records and by patient's main contact person (friend), Autumn [name].   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 Patient has been following with a podiatrist at [facility] in the outpatient setting and reportedly had recently refused a left foot surgery secondary to her chronic wounds; she also has a history of left foot surgery for the same chronic issues in the past. She has received  oral antibiotics, augmentin and then keflex at [facility] for Enterobacter aerogens culture positive in the foot ulcer in August 2022. Per patient's contact person, Ms Autumn [name], patient uses an electronic cigarette now with a former smoking history. She does not drink any alcohol, use illicit drugs, or misuse prescription medications. Patient is a former Psychologist, sport and exercise.    Patient was brought via EMS to the ER on 10/29 status post multiple falls and reportedly hit her head multiple times during these falls, also with worsening left foot and ankle pain, swelling, and redness. Patient was noted in new rapid atrial fibrillation in the ER. Labs in the ED showed sodium of 132, potassium 3.2, BUN 16, creatinine 3.65 baseline of 3.19, AST 35, BNP 360, troponin 147, lactate 1.3, white count 15.8, Hb 8 and platelet 190. CT of the head was unremarkable, [place] Left LE was negative for DVT, CXR was unremarkable, and XR L Foot was concerning for osteomyelitis 2nd + 3rd metatarsals with soft tissue swelling of foot and ankle. Patient was admitted to the hospital with delay in transfer from the ER due to bed availability. She was started on Cardizem infusion for rate control and continuous heparin infusion for anticoagulation. IV Cefepime, vancomycin, flagyl were initiated per sepsis protocol. Podiatry, cardiology, nephrology were consulted initially with infectious disease consulted on 11/1 (antibiotics changed to Zosyn). Podiatrist recommended MRI left foot which showed septic arthritis of the second MTP joint and osteomyelitis of the second metatarsal and second proximal phalanx also with concern for an abscess and cellulitis. Patient is status post left foot amputation of the 2nd metatarsal and toe, I&D deep abscess multiple fascial planes, and bone biopsy open deep third metatarsal on 11/11/20. During the procedure, noted findings included  purulence found tracking up along the extensor tendons.    11/13/20, patient was taken this am  to MRI for scan of her left foot; however, she was unable to complete the MRI secondary to agitation despite IV ativan administration. Patient reportedly has had waxing/waning mental status with possibly some hallucinations and decreased orientation. Per the RN from the floor, patient was able to verbalize at least her name and date of birth this morning and was following commands.  However, patient's mental status reportedly declined through the morning today with speech changes. She became unresponsive after a coughing fit episode while taking her oral medications with fluids. Rapid response was paged, as well as Code Stroke.    Patient was transferred to the ICU after her CT Head per stroke protocols. Patient was assessed at bedside upon arrival to the ICU. Patient is awake, agitated, attempting to follow simple commands; however she is unable to lift her arms on command. She has some movement of right arm when requested to lift her arms. She is tracking with eyes open and responds to questions; however, her speech is garbled and unintelligible. When asked if in pain, patient nods and speaks but her speech is garbled. Patient also has noted increased oral secretions sparking concern for airway protection. Decision was made to emergently intubate the patient for mechanical ventilation and airway management during suspected neurologic event with possible aspiration concern. Post intubation, patient was started on continuous propofol and fentanyl infusions for sedation/analgesia. Notified patient's contact person Autumn [name] of patient's status change and emergent procedure, who arrived post intubation and provided more history of recent illness (included above).   Pertinent  Medical History   Past Medical History:  Diagnosis Date   Diabetes mellitus without complication ([place])     Hypertension     Kidney stone     Thyroid disease    CKD stage 4/5 Chronic diastolic heart failure Anemia of chronic  disease Cirrhosis - NASH OSA Chronic diabetic foot ulcer - has been following with podiatry outpatient and refused surgery within last 3 months  Significant Hospital Events: Including procedures, antibiotic start and stop dates in addition to other pertinent events   10/29: presented to [facility] ER s/p multiple falls, 5-weeks duration left foot pain, swelling, redness, hit head x2 during fall. ER workup: EKG showed rapid afib, CT head unremarkable, [place] Left LE no DVT, CXR unremarkable, XR L Foot concerning for osteomyelitis 2nd + 3rd metatarsals with soft tissue swelling of foot and ankle. Also with elevated BNP, elevated troponin, rapid Afib, concern for early sepsis, elevated BUN/cr acute renal failure on CKD (baseline Cr 3.19).  10/29: Admitted to floor, started on cardizem infusion for rate control,continuous heparin infusion for anticoagulation in setting of afib. IV Cefepime, vancomycin, flagyl initiated. Podiatry, cardiology, nephrology consulted. MRI left foot: septic arthritis of the second MTP joint and osteomyelitis of the second metatarsal and second proximal phalanx also with concern for an abscess and cellulitis 10/31: Surgery completed: Left foot Amputation 2nd metatarsal and toe, I&D deep abscess multiple fascial planes, bone biopsy open deep third metatarsal. Note: Purulence found tracking up along the extensor tendons 11/1: ID consulted for infection mgmt; antibiotics adjusted to zosyn 11/2: Unable to complete MRI foot due to agitation despite IV ativan administration. Developed new confusion per RN which progressively worsened over the morning. Became unresponsive after coughing episode during pill swallowing; rapid response paged, stroke alert paged.  11/2: Transferred to ICU rm 10 after CT Head per stroke alert. Patient  awake, attempting to follow simple commands however unable to lift arms on command. Tracking, responds to voice; however speech is garbled and unintelligible. When asked  if in pain, patient nods and speaks but garbled. + oral secretions, concern for airway protection and patient was emergently intubated.  Notified patient's contact person Autumn [name], who arrived post intubation.  11/2: MRI Brain showed acute to early subacute infarction in the left posterior frontal cortical and subcortical brain, possibly affecting the precentral gyrus. There was no mass effect or hemorrhage. EEG with moderate diffuse slowing and occasional brief periods of intervening suppression, both indicative of cerebral dysfunction, medication effect, or both. No electrographic seizures or epileptiform discharges seen.  11/3: Remains MV, on propofol and fentanyl gtts. Turns head to voice, following limited commands.   Micro Data:  10/30 MRSA PCR: (-) 10/31 L metatarsal tissue: NGTD 10/31 L foot abscess: NGTD 10/31 2nd metatarsal tissue: NGTD 11/2 Tracheal aspirate NGTD  Antimicrobials:  10/29 Vancomycin >> 10/31 10/29 Cefepime >> 11/01 10/29 Metronidazole >> 11/01 11/02 Zosyn >>  Interim History / Subjective:  No acute events overnight.  Intubated, sedated with propofol and fentanyl infusions. Moves head to voice, not following commands however RN reports patient will nod her head to questions.   Objective   Blood pressure 104/66, pulse (!) 102, temperature 97.7 F (36.5 C), temperature source Esophageal, resp. rate 18, height '5\' 6"'  (1.676 m), weight 86.2 kg, SpO2 96 %.    Vent Mode: PRVC FiO2 (%):  [30 %-40 %] 30 % Set Rate:  [14 bmp] 14 bmp Vt Set:  [450 mL] 450 mL PEEP:  [5 cmH20] 5 cmH20 Plateau Pressure:  [14 cmH20] 14 cmH20   Intake/Output Summary (Last 24 hours) at 11/14/2020 0804 Last data filed at 11/14/2020 0400 Gross per 24 hour  Intake 649.03 ml  Output 1075 ml  Net -425.97 ml   Filed Weights   11/09/20 1214  Weight: 86.2 kg    REVIEW OF SYSTEMS PATIENT IS UNABLE TO PROVIDE COMPLETE REVIEW OF SYSTEMS DUE TO SEVERE CRITICAL ILLNESS AND TOXIC METABOLIC  ENCEPHALOPATHY  PHYSICAL EXAMINATION:  GENERAL:critically ill appearing, +resp distress EYES: Pupils equal, round, reactive to light.  No scleral icterus.  MOUTH: Moist mucosal membrane. INTUBATED NECK: Supple.  PULMONARY: +rhonchi bilaterally CARDIOVASCULAR: S1 and S2.  No murmurs  GASTROINTESTINAL: Soft, nontender, nondistended. Positive bowel sounds.  MUSCULOSKELETAL: No swelling, clubbing, or edema. Noted gauze bandage to left foot and left ankle NEUROLOGIC: unable to assess, sedated. Responds to voice by turning her head, not following commands SKIN:intact,warm,dry +erythema noted above bandages left LE   Labs/imaging that I havepersonally reviewed  (right click and "Reselect all SmartList Selections" daily)  10/29 CT Head >>unremarkable 10/29 CXR >>unremarkable 10/29 XR Left Foot >> concern for osteomyelitis changes 2nd and 3rd metatarsals with soft tissue swelling foot/ankle 10/29 [place] LE DVT >>negative for DVT 10/29: MR Left foot >>septic arthritis of the second MTP joint and osteomyelitis of the second metatarsal and second proximal phalanx also with concern for an abscess and cellulitis 11/1 CXR >>Patchy right lung airspace and interstitial airspace opacity  11/2 CT Head >> no acute intracranial abnormality  11/2 CXR post intubation >> ETT at [place] directed towards R mainstem bronchus, recommend withdrawal 2-3cm, Enlargement of cardiac silhouette with pulmonary vascular congestion and increased right lung infiltrate. 11/2 MRI Brain >>acute to subacute infarction affecting the left posterior frontal cortical and subcortical brain, potentially the precentral gyrus. Otherwise, there are chronic small-vessel ischemic changes of the pons and  cerebral hemispheres. A few punctate foci of chronic-appearing hemosiderin deposition are seen in the pons and cerebral hemispheres. 11/2 EEG >> with moderate diffuse slowing and occasional brief periods of intervening suppression, both indicative  of cerebral dysfunction, medication effect, or both. No electrographic seizures or epileptiform discharges seen.   Resolved Hospital Problem list     ASSESSMENT AND PLAN  70 yo F admitted 10/29 s/p falls at home, worsening left foot chronic diabetic ulcer with osteomyelitis 2nd+3rd metatarsals and cellulitis noted on MRI, s/p L foot amputation of the 2nd metatarsal and toe, I&D deep abscess multiple fascial planes, and bone biopsy open deep third metatarsal on 10/31. Other comorbidities include sepsis without shock secondary to osteomyelitis and cellulitis left foot (IV zosyn), AKI on CKD, rapid afib (Cardizem + Heparin gtt), NSTEMI suspect demand ischemia, anemia acute on chronic. Transferred to ICU and intubated 11/2 after evolving neuro changes with unresponsiveness after coughing episode; post rapid response and code stroke. Patient is being followed by cardiology, nephrology, podiatry, and infectious disease. 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  Severe Acute Hypoxic Respiratory Failure New R lung infiltrate, suspect pneumonia, possible aspiration Intubated 11/13/20 ETT withdrawn 2cm after post intubation CXR reviewed -continue Mechanical Ventilator support -continue Bronchodilator Therapy: Duonebs, Pulmicort nebs -Wean Fio2 and PEEP as tolerated -VAP/VENT bundle implementation -will perform SAT/SBT when respiratory parameters are met - Follow tracheal aspirate for culture   Vent Mode: PRVC FiO2 (%):  [30 %-40 %] 30 % Set Rate:  [14 bmp] 14 bmp Vt Set:  [450 mL] 450 mL PEEP:  [5 cmH20] 5 cmH20 Plateau Pressure:  [14 cmH20] 14 cmH20   Acute encephalopathy 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 Wean  fentanyl gtt to prn IV meds for comfort, agitation, vent synchrony Assess neuro daily off sedation   Sepsis without septic shock Secondary to left foot osteomyelitis + cellulitis left foot/ankle Hypotension secondary to sedation/MV with sepsis - Use vasopressors to keep MAP>65 as needed - Follow ABG and LA - Follow up cultures; repeat blood cultures, send tracheal aspirate  - Infectious disease following; antibiotics (IV Zosyn) per ID recs - Consider stress dose steroids - Cautious IV fluid resuscitation in setting of CHF - Podiatry following for L foot    CARDIAC New atrial fibrillation with RVR NSTEMI POA - suspect demand ischemia Chronic diastolic HF Chronic HTN Echo 10/30>> LVEF 55-60%, mild concentric LV hypertrophy, elevated LV end-diastolic pressure; severely dilated left atrial size; small patent foramen ovale - Cardiology following; appreciate recs - Cardizem + metoprolol po per tube - Continuous heparin infusion >> low concern for septic emboli based on MRI Brain imaging and neuro recommendations; remains on hold need to assess anticoagulation -Lasix as tolerated -follow up cardiac enzymes as indicated - Outpatient testing for demand ischemia   Acute Kidney Injury on CKD 4/5 - continue Foley Catheter-assess need -Avoid nephrotoxic agents -Follow urine output, BMP -Ensure adequate renal perfusion, optimize oxygenation -Renal dose medications - Nephrology following; appreciate input  Intake/Output Summary (Last 24 hours) at 11/14/2020 0804 Last data filed at 11/14/2020 0400 Gross per 24 hour  Intake 649.03 ml  Output 1075 ml  Net -425.97 ml   Anemia of Chronic Disease Secondary to CKD 4/5 Query acute component secondary to surgery, dilutional  - Monitor H/H q24h -  Monitor for any s/s bleeding - Transfuse for hgb < 7.0   ENDO Type 2 Diabetes Mellitus - ICU hypoglycemic\Hyperglycemia protocol -check FSBS per protocol   GI Hx NASH with liver cirrhosis GI  PROPHYLAXIS as indicated   Hypothyroidism - Levothyroxine    NUTRITIONAL STATUS OG tube verified via KUB 11/2 DIET--> TF Nutrition following Constipation protocol as indicated   ELECTROLYTES -follow labs as needed -replace as needed -pharmacy consultation and following   Best practice (right click and "Reselect all SmartList Selections" daily)  Diet: NPO Pain/Anxiety/Delirium protocol (if indicated): Yes (RASS goal -2) VAP protocol (if indicated): Yes DVT prophylaxis: Systemic AC GI prophylaxis: PPI Glucose control:  SSI Yes Central venous access:  N/A Arterial line:  N/A Foley:  Yes, and it is still needed Mobility:  bed rest  Code Status:  FULL Disposition:ICU  Labs   CBC: Recent Labs  Lab 11/09/20 1220 11/10/20 0649 11/11/20 0636 11/12/20 0540 11/13/20 0203 11/14/20 0526  WBC 15.8* 13.5* 15.6* 15.9* 15.9* 14.5*  NEUTROABS 13.5*  --   --   --   --   --   HGB 8.0* 7.2* 7.4* 7.0* 8.5* 8.5*  HCT 24.5* 22.3* 22.6* 21.9* 26.7* 26.7*  MCV 86.3 85.4 86.3 87.6 85.3 87.0  PLT 190 180 185 189 220 299    Basic Metabolic Panel: Recent Labs  Lab 11/09/20 1818 11/10/20 0649 11/11/20 0636 11/12/20 0540 11/13/20 0203 11/14/20 0526  NA  --  132* 132* 133* 132* 133*  K  --  3.5 4.0 4.5 4.1 4.5  CL  --  103 104 105 104 104  CO2  --  18* 18* 14* 18* 16*  GLUCOSE  --  152* 133* 131* 117* 183*  BUN  --  64* 72* 76* 87* 85*  CREATININE  --  3.50* 3.65* 3.88* 4.13* 4.91*  CALCIUM  --  8.2* 8.6* 8.7* 8.5* 8.9  MG 2.0  --   --   --   --   --   PHOS  --   --   --   --   --  7.8*   GFR: Estimated Creatinine Clearance: 12.1 mL/min (A) (by C-G formula based on SCr of 4.91 mg/dL (H)). Recent Labs  Lab 11/09/20 1220 11/09/20 1818 11/10/20 0649 11/11/20 0636 11/12/20 0540 11/13/20 0203 11/14/20 0526  WBC 15.8*  --    < > 15.6* 15.9* 15.9* 14.5*  LATICACIDVEN 1.3 1.0  --   --   --   --   --    < > = values in this interval not displayed.    Liver Function  Tests: Recent Labs  Lab 11/09/20 1220 11/14/20 0526  AST 35  --   ALT 25  --   ALKPHOS 84  --   BILITOT 0.4  --   PROT 6.4*  --   ALBUMIN 2.7* 2.2*   No results for input(s): LIPASE, AMYLASE in the last 168 hours. No results for input(s): AMMONIA in the last 168 hours.  ABG    Component Value Date/Time   PHART 7.26 (L) 11/14/2020 0500   PCO2ART 37 11/14/2020 0500   PO2ART 97 11/14/2020 0500   HCO3 16.6 (L) 11/14/2020 0500   ACIDBASEDEF 9.7 (H) 11/14/2020 0500   O2SAT 96.4 11/14/2020 0500     Coagulation Profile: Recent Labs  Lab 11/09/20 1818  INR 1.3*    Cardiac Enzymes: Recent Labs  Lab 11/09/20 1818  CKTOTAL 184    HbA1C: Hgb A1c MFr Bld  Date/Time  Value Ref Range Status  11/09/2020 06:25 PM 6.3 (H) 4.8 - 5.6 % Final    Comment:    (NOTE) Pre diabetes:          5.7%-6.4%  Diabetes:              >6.4%  Glycemic control for   <7.0% adults with diabetes     CBG: Recent Labs  Lab 11/13/20 1908 11/13/20 2052 11/13/20 2304 11/14/20 0320 11/14/20 0748  GLUCAP 175* 188* 197* 165* 161*       DVT/GI PRX  assessed I Assessed the need for Labs I Assessed the need for Foley I Assessed the need for Central Venous Line Family Discussion when available I Assessed the need for Mobilization I made an Assessment of medications to be adjusted accordingly Safety Risk assessment completed  CASE DISCUSSED IN MULTIDISCIPLINARY ROUNDS WITH ICU TEAM  Critical Care Time devoted to patient care services described in this note is 55 minutes.   Critical care was necessary to treat /prevent imminent and life-threatening deterioration.  Patient is critically ill.   Corrin Parker, M.D.  [facility]  Medical Director [place] Director [facility] Cardio-Pulmonary Department

## 2020-11-14 NOTE — Consult Note (Signed)
Pharmacy Antibiotic Note  Autumn [name] is a 70 y.o. female admitted on 11/09/2020 with  osteomyelitis .  Pharmacy has been consulted for Unasyn dosing. ID following.   Presented with left foot draining. PMH includes liver cirrhosis d/t NASH, HTN, thyroid disease, DM.   MRI of foot c/w osteomyelitis and abscess. Cultures of left foot abscess resulted, narrowing to Unasyn as per discussion with ID.   Plan: Ampicillin/sulbactam 3 grams every 12 hours  Monitor renal function, clinical course, and LOT.  Height: 5\' 6"  (167.6 cm) Weight: 86.2 kg (190 lb) IBW/kg (Calculated) : 59.3  Temp (24hrs), Avg:97.5 F (36.4 C), Min:96.6 F (35.9 C), Max:98.1 F (36.7 C)  Recent Labs  Lab 11/09/20 1220 11/09/20 1818 11/10/20 0649 11/10/20 1644 11/11/20 0636 11/11/20 2122 11/12/20 0540 11/12/20 1551 11/13/20 0203 11/14/20 0526  WBC 15.8*  --  13.5*  --  15.6*  --  15.9*  --  15.9* 14.5*  CREATININE 3.65*  --  3.50*  --  3.65*  --  3.88*  --  4.13* 4.91*  LATICACIDVEN 1.3 1.0  --   --   --   --   --   --   --   --   VANCORANDOM  --   --   --    < >  --  12  --  14  --   --    < > = values in this interval not displayed.     Estimated Creatinine Clearance: 12.1 mL/min (A) (by C-G formula based on SCr of 4.91 mg/dL (H)).    Allergies  Allergen Reactions   Codeine Nausea And Vomiting    Antimicrobials this admission: 10/29 Vancomycin >> 10/31 10/29 Cefepime >> 11/01 10/29 Metronidazole >> 11/01 11/02 Zosyn >> 11/3 11/3 Unasyn >>  Microbiology results: 10/30 MRSA PCR: (-)  10/31 Wound (2nd metatarsal): strep    mitis/oralis, pasteurella 10/31 Abscess (left foot): MSSA, pasteurella 10/31 L metatarsal tissue: NGTD   Thank you for allowing pharmacy to be a part of this patient's care.   Wynelle Cleveland, PharmD Pharmacy Resident  11/14/2020 4:50 PM

## 2020-11-14 NOTE — Evaluation (Signed)
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 Other comorbidities include sepsis without shock secondary to osteomyelitis and cellulitis left foot (IV zosyn), AKI on CKD, rapid afib (Cardizem + Heparin gtt), NSTEMI suspect demand ischemia, anemia acute on chronic. Transferred to ICU and intubated 11/2 after evolving neuro changes with unresponsiveness after coughing episode. 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  Clinical Impression  OT/PT co-treat this session to maximize pt function. Information on PLOF and home-setup were not determined due to pt's lack of ability for communication. PT to further assess as able.  Pt alert in bed, oriented to name with attempts at communication; pt is largely non verbal but able to communicate by gripping L hand when questioned and nod yes and no w/ questions inconsistently. Pt presents with significant bilateral UE and LE weakness (R side > L). Pt unable to volitionally mobilize R LE but does demonstrate reflexive responses. Pt demonstrates R sided neglect but was able to track with verbal cues intermittently beginning of treatment session but was no longer tracking end of session to multimodal cues. MAX-A for sitting EOB with fair sitting balance without the need for physical assist ~ 5 minutes. CIR recommended at discharge to regain functional ability as able. Skilled PT intervention is indicated to address deficits in function, mobility, and to return to PLOF  as able.       Recommendations for follow up therapy are one component of a multi-disciplinary discharge planning process, led by the attending physician.  Recommendations may be updated based on patient status, additional functional criteria and insurance authorization.  Follow Up Recommendations Acute inpatient rehab (3hours/day)    Assistance Recommended at Discharge Frequent or constant Supervision/Assistance  Functional Status Assessment Patient has had a recent decline in their functional status and demonstrates the ability to make significant improvements in function in a reasonable and predictable amount of time.  Equipment Recommendations  Other (comment) (TBD next venue of care)    Recommendations for Other Services       Precautions / Restrictions Precautions Precautions: Fall Restrictions Weight Bearing Restrictions: Yes LLE Weight Bearing: Non weight bearing      Mobility  Bed Mobility Overal bed mobility: Needs Assistance Bed Mobility: Supine to Sit;Sit to Supine     Supine to sit: Max assist;+2 for physical assistance Sit to supine: Max assist;+2 for physical assistance        Transfers                   General transfer comment: deferred 2/2 poor sitting tolerance    Ambulation/Gait                Stairs            Wheelchair Mobility    Modified Rankin (Stroke Patients Only)       Balance Overall balance assessment: Needs assistance Sitting-balance support: Feet unsupported Sitting balance-Leahy Scale: Fair Sitting balance - Comments: able to sit ~ 5 minutes with ability to correct R lateral trunk lean w/ tactile assist Postural control: Right  lateral lean                                   Pertinent Vitals/Pain Pain Assessment: Faces Faces Pain Scale: Hurts little more Pain Location: Head, RLE Pain Descriptors / Indicators: Grimacing;Aching Pain Intervention(s): Limited activity within patient's  tolerance;Monitored during session    Rodman expects to be discharged to:: Private residence Living Arrangements: Non-relatives/Friends                 Additional Comments: Pt uanble to provide information regarding PLOF and home set-up at this time. PT to investigate further as able.    Prior Function               Mobility Comments: Pt uanble to provide information regarding PLOF and home set-up at this time. PT to investigate further as able.       Hand Dominance   Dominant Hand: Right    Extremity/Trunk Assessment   Upper Extremity Assessment Upper Extremity Assessment: RUE deficits/detail;LUE deficits/detail RUE Deficits / Details: Was able to lift against gravity intermittently, gripped fingers sporadically 3/5 RUE Coordination: decreased fine motor;decreased gross motor LUE Deficits / Details: Good grip, shoulder,elbow 2/5    Lower Extremity Assessment Lower Extremity Assessment: RLE deficits/detail;LLE deficits/detail RLE Deficits / Details: Increased PF tone, absent clonus, 2/5 LE LLE Deficits / Details: 3/5 hip flexion and knee flexion       Communication   Communication: Expressive difficulties  Cognition Arousal/Alertness: Awake/alert Behavior During Therapy: Flat affect Overall Cognitive Status: Difficult to assess Area of Impairment: Following commands;Safety/judgement;Awareness                       Following Commands: Follows one step commands inconsistently Safety/Judgement: Decreased awareness of safety;Decreased awareness of deficits Awareness: Intellectual   General Comments: able to inconsistently follow commands, R neglect and is able to provide responses by using L hand squeezing PT fingers w/ yes, no questions        General Comments General comments (skin integrity, edema, etc.): vitals WNL    Exercises Other Exercises Other Exercises: Pt educ re: importance of mvmt for functional mobility,  neglect Other Exercises: sup<>sit, UE/LE assessment, static sitting   Assessment/Plan    PT Assessment Patient needs continued PT services  PT Problem List Decreased strength;Decreased cognition;Impaired tone;Decreased range of motion;Decreased activity tolerance;Decreased balance;Decreased mobility;Decreased coordination;Impaired sensation;Decreased knowledge of use of DME;Decreased safety awareness;Decreased knowledge of precautions       PT Treatment Interventions Gait training;DME instruction;Balance training;Stair training;Functional mobility training;Therapeutic activities;Therapeutic exercise;Neuromuscular re-education    PT Goals (Current goals can be found in the Care Plan section)  Acute Rehab PT Goals Patient Stated Goal: none stated PT Goal Formulation: With patient Time For Goal Achievement: 11/28/20 Potential to Achieve Goals: Fair    Frequency 7X/week   Barriers to discharge        Co-evaluation PT/OT/SLP Co-Evaluation/Treatment: Yes Reason for Co-Treatment: Complexity of the patient's impairments (multi-system involvement);Necessary to address cognition/behavior during functional activity;For patient/therapist safety;To address functional/ADL transfers PT goals addressed during session: Mobility/safety with mobility OT goals addressed during session: ADL's and self-care       AM-PAC PT "6 Clicks" Mobility  Outcome Measure Help needed turning from your back to your side while in a flat bed without using bedrails?: A Lot Help needed moving from lying on your back to sitting on the side of a flat bed  without using bedrails?: Total Help needed moving to and from a bed to a chair (including a wheelchair)?: Total Help needed standing up from a chair using your arms (e.g., wheelchair or bedside chair)?: Total Help needed to walk in hospital room?: Total Help needed climbing 3-5 steps with a railing? : Total 6 Click Score: 7    End of Session   Activity Tolerance:  Patient tolerated treatment well Patient left: in bed;with call bell/phone within reach Nurse Communication: Mobility status PT Visit Diagnosis: Other abnormalities of gait and mobility (R26.89);Hemiplegia and hemiparesis;Muscle weakness (generalized) (M62.81);History of falling (Z91.81) Hemiplegia - Right/Left: Right Hemiplegia - dominant/non-dominant: Dominant    Time: 6606-0045 PT Time Calculation (min) (ACUTE ONLY): 24 min   Charges:              [employer], SPT

## 2020-11-14 NOTE — Plan of Care (Signed)
Patient is extubated to 5 lpm O2 nasal canula. Tolerating well

## 2020-11-14 NOTE — Progress Notes (Signed)
[facility]  ROUNDING NOTE   Subjective:   Remains intubated ; Off sedation this morning Able to follow commands No pressors   Objective:  Vital signs in last 24 hours:  Temp:  [96.6 F (35.9 C)-97.9 F (36.6 C)] 97.9 F (36.6 C) (11/03 0800) Pulse Rate:  [41-121] 109 (11/03 1000) Resp:  [10-21] 17 (11/03 1000) BP: (87-127)/(58-89) 114/87 (11/03 1000) SpO2:  [84 %-100 %] 95 % (11/03 1000) FiO2 (%):  [30 %-40 %] 30 % (11/03 0800)  Weight change:  Filed Weights   11/09/20 1214  Weight: 86.2 kg    Intake/Output: I/O last 3 completed shifts: In: 1661 [P.O.:600; I.V.:436.6; Blood:412; NG/GT:120; IV Piggyback:92.4] Out: 2050 [Urine:2050]   Intake/Output this shift:  Total I/O In: 29 [I.V.:29] Out: -   Physical Exam: General: NAD, critically ill-appearing  Head: ET tube in place  Eyes: Anicteric,   Lungs:  ventilator assist  Heart: Irregular rhythm  Abdomen:  Soft, nontender  Extremities:  no peripheral edema.  Surgical dressing on left foot  Neurologic: Alert and able to follow commands   Skin: Warm        Basic Metabolic Panel: Recent Labs  Lab 11/09/20 1818 11/10/20 0649 11/11/20 0636 11/12/20 0540 11/13/20 0203 11/14/20 0526  NA  --  132* 132* 133* 132* 133*  K  --  3.5 4.0 4.5 4.1 4.5  CL  --  103 104 105 104 104  CO2  --  18* 18* 14* 18* 16*  GLUCOSE  --  152* 133* 131* 117* 183*  BUN  --  64* 72* 76* 87* 85*  CREATININE  --  3.50* 3.65* 3.88* 4.13* 4.91*  CALCIUM  --  8.2* 8.6* 8.7* 8.5* 8.9  MG 2.0  --   --   --   --   --   PHOS  --   --   --   --   --  7.8*     Liver Function Tests: Recent Labs  Lab 11/09/20 1220 11/14/20 0526  AST 35  --   ALT 25  --   ALKPHOS 84  --   BILITOT 0.4  --   PROT 6.4*  --   ALBUMIN 2.7* 2.2*    No results for input(s): LIPASE, AMYLASE in the last 168 hours. No results for input(s): AMMONIA in the last 168 hours.  CBC: Recent Labs  Lab 11/09/20 1220 11/10/20 0649 11/11/20 0636  11/12/20 0540 11/13/20 0203 11/14/20 0526  WBC 15.8* 13.5* 15.6* 15.9* 15.9* 14.5*  NEUTROABS 13.5*  --   --   --   --   --   HGB 8.0* 7.2* 7.4* 7.0* 8.5* 8.5*  HCT 24.5* 22.3* 22.6* 21.9* 26.7* 26.7*  MCV 86.3 85.4 86.3 87.6 85.3 87.0  PLT 190 180 185 189 220 195     Cardiac Enzymes: Recent Labs  Lab 11/09/20 1818  CKTOTAL 184     BNP: Invalid input(s): POCBNP  CBG: Recent Labs  Lab 11/13/20 1908 11/13/20 2052 11/13/20 2304 11/14/20 0320 11/14/20 0748  GLUCAP 175* 188* 197* 165* 161*     Microbiology: Results for orders placed or performed during the hospital encounter of 11/09/20  Resp Panel by RT-PCR (Flu A&B, Covid) Nasopharyngeal Swab     Status: None   Collection Time: 11/09/20  6:25 PM   Specimen: Nasopharyngeal Swab; Nasopharyngeal(NP) swabs in vial transport medium  Result Value Ref Range Status   SARS Coronavirus 2 by RT PCR NEGATIVE NEGATIVE Final  Comment: (NOTE) SARS-CoV-2 target nucleic acids are NOT DETECTED.  The SARS-CoV-2 RNA is generally detectable in upper respiratory specimens during the acute phase of infection. The lowest concentration of SARS-CoV-2 viral copies this assay can detect is 138 copies/mL. A negative result does not preclude SARS-Cov-2 infection and should not be used as the sole basis for treatment or other patient management decisions. A negative result may occur with  improper specimen collection/handling, submission of specimen other than nasopharyngeal swab, presence of viral mutation(s) within the areas targeted by this assay, and inadequate number of viral copies(<138 copies/mL). A negative result must be combined with clinical observations, patient history, and epidemiological information. The expected result is Negative.  Fact Sheet for Patients:  EntrepreneurPulse.com.au  Fact Sheet for Healthcare Providers:  IncredibleEmployment.be  This test is no t yet approved or  cleared by the [place] FDA and  has been authorized for detection and/or diagnosis of SARS-CoV-2 by FDA under an Emergency Use Authorization (EUA). This EUA will remain  in effect (meaning this test can be used) for the duration of the COVID-19 declaration under Section 564(b)(1) of the Act, 21 U.S.C.section 360bbb-3(b)(1), unless the authorization is terminated  or revoked sooner.       Influenza A by PCR NEGATIVE NEGATIVE Final   Influenza B by PCR NEGATIVE NEGATIVE Final    Comment: (NOTE) The Xpert Xpress SARS-CoV-2/FLU/RSV plus assay is intended as an aid in the diagnosis of influenza from Nasopharyngeal swab specimens and should not be used as a sole basis for treatment. Nasal washings and aspirates are unacceptable for Xpert Xpress SARS-CoV-2/FLU/RSV testing.  Fact Sheet for Patients: EntrepreneurPulse.com.au  Fact Sheet for Healthcare Providers: IncredibleEmployment.be  This test is not yet approved or cleared by the [place] FDA and has been authorized for detection and/or diagnosis of SARS-CoV-2 by FDA under an Emergency Use Authorization (EUA). This EUA will remain in effect (meaning this test can be used) for the duration of the COVID-19 declaration under Section 564(b)(1) of the Act, 21 U.S.C. section 360bbb-3(b)(1), unless the authorization is terminated or revoked.  Performed at [facility], [address] [place], [place] [address]   MRSA Next Gen by PCR, Nasal     Status: None   Collection Time: 11/10/20  1:14 AM   Specimen: Nasal Mucosa; Nasal Swab  Result Value Ref Range Status   MRSA by PCR Next Gen NOT DETECTED NOT DETECTED Final    Comment: (NOTE) The GeneXpert MRSA Assay (FDA approved for NASAL specimens only), is one component of a comprehensive MRSA colonization surveillance program. It is not intended to diagnose MRSA infection nor to guide or monitor treatment for MRSA infections. Test  performance is not FDA approved in patients less than 44 years old. Performed at [facility], [address] [place], [place] [address]   Aerobic/Anaerobic Culture w Gram Stain (surgical/deep wound)     Status: None (Preliminary result)   Collection Time: 11/11/20  3:23 PM   Specimen: Foot, Left; Abscess  Result Value Ref Range Status   Specimen Description   Final    WOUND LEFT FOOT ABSCESS Performed at [facility], [address] [place], [place] [address]    Special Requests   Final    NONE Performed at [facility], [place], [place] [address]    Gram Stain   Final    NO ORGANISMS SEEN SQUAMOUS EPITHELIAL CELLS PRESENT MODERATE WBC PRESENT, PREDOMINANTLY MONONUCLEAR MODERATE GRAM POSITIVE COCCI  Culture   Final    RARE STAPHYLOCOCCUS AUREUS RARE PASTEURELLA MULTOCIDA Usually susceptible to penicillin and other beta lactam agents,quinolones,macrolides and tetracyclines. HOLDING FOR POSSIBLE ANAEROBE Performed at [facility], [place] [address], [place], [place] [address]    Report Status PENDING  Incomplete   Organism ID, Bacteria STAPHYLOCOCCUS AUREUS  Final      Susceptibility   Staphylococcus aureus - MIC*    CIPROFLOXACIN <=0.5 SENSITIVE Sensitive     ERYTHROMYCIN <=0.25 SENSITIVE Sensitive     GENTAMICIN <=0.5 SENSITIVE Sensitive     OXACILLIN <=0.25 SENSITIVE Sensitive     TETRACYCLINE <=1 SENSITIVE Sensitive     VANCOMYCIN <=0.5 SENSITIVE Sensitive     TRIMETH/SULFA <=10 SENSITIVE Sensitive     CLINDAMYCIN <=0.25 SENSITIVE Sensitive     RIFAMPIN <=0.5 SENSITIVE Sensitive     Inducible Clindamycin NEGATIVE Sensitive     * RARE STAPHYLOCOCCUS AUREUS  Aerobic/Anaerobic Culture w Gram Stain (surgical/deep wound)     Status: Abnormal (Preliminary result)   Collection Time: 11/11/20  3:32 PM   Specimen: Other Source; Tissue  Result Value Ref Range Status   Specimen Description   Final    WOUND 2ND  METATARSAL Performed at [facility], [address] [place], [place] [address]    Special Requests   Final    NONE Performed at [facility], [address] [place], [place] [address]    Gram Stain   Final    NO ORGANISMS SEEN SQUAMOUS EPITHELIAL CELLS PRESENT FEW WBC PRESENT, PREDOMINANTLY MONONUCLEAR FEW GRAM POSITIVE COCCI    Culture (A)  Final    STREPTOCOCCUS MITIS/ORALIS HOLDING FOR POSSIBLE ANAEROBE CULTURE REINCUBATED FOR BETTER GROWTH Performed at [facility], [place] [address] [place], [place] [address]    Report Status PENDING  Incomplete  Aerobic/Anaerobic Culture w Gram Stain (surgical/deep wound)     Status: None (Preliminary result)   Collection Time: 11/11/20  4:26 PM   Specimen: Other Source; Tissue  Result Value Ref Range Status   Specimen Description   Final    WOUND LEFT METATARSAL Performed at [facility], [address] [place], [place] [address]    Special Requests   Final    NONE Performed at [facility], [place], [place] [address]    Gram Stain NO WBC SEEN NO ORGANISMS SEEN   Final   Culture   Final    NO GROWTH 2 DAYS NO ANAEROBES ISOLATED; CULTURE IN PROGRESS FOR 5 DAYS Performed at [facility] [address], [place], [place] [address]    Report Status PENDING  Incomplete  MRSA Next Gen by PCR, Nasal     Status: None   Collection Time: 11/13/20 11:22 AM   Specimen: Nasal Mucosa; Nasal Swab  Result Value Ref Range Status   MRSA by PCR Next Gen NOT DETECTED NOT DETECTED Final    Comment: (NOTE) The GeneXpert MRSA Assay (FDA approved for NASAL specimens only), is one component of a comprehensive MRSA colonization surveillance program. It is not intended to diagnose MRSA infection nor to guide or monitor treatment for MRSA infections. Test performance is not FDA approved in patients less than 68 years old. Performed at [facility], [address]  [place], [place] [address]   CULTURE, BLOOD (ROUTINE X 2) w Reflex to ID Panel     Status: None (Preliminary result)   Collection Time: 11/13/20 12:44 PM   Specimen: BLOOD  Result  Value Ref Range Status   Specimen Description BLOOD LEFT ANTECUBITAL  Final   Special Requests   Final    BOTTLES DRAWN AEROBIC AND ANAEROBIC Blood Culture adequate volume   Culture   Final    NO GROWTH < 24 HOURS Performed at [facility], [address], [place], [place] [address]    Report Status PENDING  Incomplete  CULTURE, BLOOD (ROUTINE X 2) w Reflex to ID Panel     Status: None (Preliminary result)   Collection Time: 11/13/20 12:52 PM   Specimen: BLOOD  Result Value Ref Range Status   Specimen Description BLOOD BLOOD LEFT HAND  Final   Special Requests   Final    BOTTLES DRAWN AEROBIC ONLY Blood Culture results may not be optimal due to an inadequate volume of blood received in culture bottles   Culture   Final    NO GROWTH < 24 HOURS Performed at [facility], [address], [place], [place] [address]    Report Status PENDING  Incomplete  Culture, Respiratory w Gram Stain     Status: None (Preliminary result)   Collection Time: 11/13/20  3:43 PM   Specimen: Tracheal Aspirate; Respiratory  Result Value Ref Range Status   Specimen Description   Final    TRACHEAL ASPIRATE Performed at [facility], [address], [place], [place] [address]    Special Requests   Final    NONE Performed at [facility], [address] [place], [place] [address]    Gram Stain   Final    NO SQUAMOUS EPITHELIAL CELLS SEEN FEW WBC SEEN NO ORGANISMS SEEN    Culture   Final    NO GROWTH < 12 HOURS Performed at [facility], [place] [address], [place], [place] [address]    Report Status PENDING  Incomplete    Coagulation Studies: No results for input(s): LABPROT, INR in the last 72 hours.   Urinalysis: No results for input(s): COLORURINE, LABSPEC, PHURINE,  GLUCOSEU, HGBUR, BILIRUBINUR, KETONESUR, PROTEINUR, UROBILINOGEN, NITRITE, LEUKOCYTESUR in the last 72 hours.  Invalid input(s): APPERANCEUR     Imaging: CT HEAD WO CONTRAST (5MM)  Result Date: 11/13/2020 CLINICAL DATA:  Mental status changes EXAM: CT HEAD WITHOUT CONTRAST TECHNIQUE: Contiguous axial images were obtained from the base of the skull through the vertex without intravenous contrast. COMPARISON:  11/09/2020 FINDINGS: Brain: stable atrophy pattern and chronic white matter microvascular ischemic changes throughout both cerebral hemispheres. Limited with some motion artifact. No acute intracranial hemorrhage, mass lesion, new infarction, midline shift, herniation, hydrocephalus, or extra-axial fluid collection. No focal mass effect or edema. Cisterns are patent. Cerebellar atrophy as well. Vascular: Intracranial atherosclerosis at the skull base. No hyperdense vessel. Skull: Normal. Negative for fracture or focal lesion. Sinuses/Orbits: No acute finding. Other: None. IMPRESSION: Stable atrophy and white matter microvascular ischemic changes. No acute intracranial abnormality by noncontrast CT. Electronically Signed   By: Jerilynn Mages.  Shick M.D.   On: 11/13/2020 09:58   MR BRAIN WO CONTRAST  Result Date: 11/13/2020 CLINICAL DATA:  Mental status changes of unknown cause. Recent fall at home. EXAM: MRI HEAD WITHOUT CONTRAST TECHNIQUE: Multiplanar, multiecho pulse sequences of the brain and surrounding structures were obtained without intravenous contrast. COMPARISON:  Head CT 11/13/2020 FINDINGS: Brain: Diffusion imaging shows an acute to subacute infarction affecting the left posterior frontal cortical and subcortical brain, potentially the precentral gyrus. No other acute finding. No sign of hemorrhage or mass effect associated with that. Otherwise, there  chronic small-vessel ischemic changes of the pons. Cerebral hemispheres show moderate chronic small-vessel ischemic changes of the white matter. No  mass lesion, hemorrhage, hydrocephalus or extra-axial collection. Few punctate foci of hemosiderin deposition noted in the pons and cerebral hemispheres. Vascular: Major vessels at the base of the brain show flow. 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 No mass effect or hemorrhage. Chronic small-vessel ischemic changes elsewhere affecting the brainstem and cerebral hemispheric white matter, some associated with chronic punctate hemosiderin deposition. Electronically Signed   By: Nelson Chimes M.D.   On: 11/13/2020 16:36   MR ANKLE LEFT WO CONTRAST  Result Date: 11/13/2020 CLINICAL DATA:  Suspected osteomyelitis of the ankle. Recent diagnosis of osteomyelitis involving the second metatarsal and proximal phalanx. EXAM: MRI OF THE LEFT ANKLE WITHOUT CONTRAST TECHNIQUE: Multiplanar, multisequence MR imaging of the ankle was performed. No intravenous contrast was administered. COMPARISON:  Radiographs and MRI of 11/09/2020 FINDINGS: The patient had great difficulty remaining stationary for today's exam. Only a single diagnostic series could be obtained. Osseous structures: Interval resection of the second metatarsal on 11/11/2020, with expected regional postoperative findings. It is difficult on the single series provided to get a good sense of the base of the third metatarsal and its articulation with the lateral cuneiform, there is potentially some accentuated fluid signal and irregularity the articulation of the lateral cuneiform and the base of the third metatarsal. No discrete edema signal to suggest osteomyelitis involving the distal tibia/fibula, calcaneus, cuboid, talus, navicular, or medial cuneiform. The middle and lateral cuneiforms are somewhat irregular and difficult to fully characterize on this single series. Much  of this irregularity is probably simply from arthropathy along the Lisfranc joint and postoperative findings. Ligamentous structures: Limited assessment due to the lack of images, no overt discontinuity along the lateral ligamentous complex or deltoid ligament. Musculotendinous: Extensor digitorum tenosynovitis. Common peroneus tendon sheath tenosynovitis noted with suspected longitudinal tearing of the peroneus brevis. Distal tibialis posterior tenosynovitis and tendinopathy. Achilles tendon intact. Plantar fascia poorly assessed. Soft tissues: Extensive subcutaneous edema circumferentially in the distal calf, mitigating somewhat in the ankle region. Dorsal subcutaneous edema in the foot. IMPRESSION: 1. Limited assessment as the patient could only tolerate a single diagnostic series to be performed. This is a small subset of the normal MRI ankle exam. Reduced diagnostic sensitivity and specificity. 2. Interval resection of the second metatarsal. Possible arthropathy between the lateral cuneiform and the base of the third metatarsal. No definite osteomyelitis in the ankle region. 3. Longitudinal tear of the peroneus brevis with peroneus tenosynovitis. 4. Distal tibialis posterior tenosynovitis and tendinopathy. There is also extensor digitorum tenosynovitis. 5. Extensive subcutaneous edema circumferentially in the distal calf. Dorsal subcutaneous edema in the foot. Electronically Signed   By: Van Clines M.D.   On: 11/13/2020 07:33   DG Chest Port 1 View  Result Date: 11/14/2020 CLINICAL DATA:  Acute respiratory failure EXAM: PORTABLE CHEST 1 VIEW COMPARISON:  Previous day FINDINGS: Endotracheal tube terminates approximately 1 cm above the carina. Esophageal temperature probe is present. There is enteric tube which terminates below the level of the diaphragm with the distal tip not visualized. The heart is enlarged. Stable appearance of widened vascular pedicle. Left the perihilar and interstitial  opacities are present bilaterally. There is increased consolidative opacity in the right lower lung. Small right pleural effusion has increased in size. No  acute osseous abnormality. IMPRESSION: 1. Support lines and tubes as described. The endotracheal tube appears to terminate approximately 1 cm above the carina. 2. Cardiomegaly with perihilar and interstitial opacities compatible with pulmonary edema. Small right pleural effusion has increased in size. 3. Evolving opacity in the right lung base is more conspicuous, and raises question for superimposed infection. Attention on follow-up films. Electronically Signed   By: Albin Felling M.D.   On: 11/14/2020 08:03   DG Chest Port 1 View  Result Date: 11/13/2020 CLINICAL DATA:  Intubation EXAM: PORTABLE CHEST 1 VIEW COMPARISON:  Portable exam 1046 hours compared to 0907 hours FINDINGS: Tip of endotracheal tube is at carina directed toward RIGHT mainstem bronchus, recommend withdrawal 2-3 cm. Enlargement of cardiac silhouette with vascular congestion. RIGHT lung infiltrate slightly increased from previous exam. Mild RIGHT basilar atelectasis. No pleural effusion or pneumothorax. IMPRESSION: Tip of endotracheal tube is at carina, recommend withdrawal 2-3 cm. Enlargement of cardiac silhouette with pulmonary vascular congestion and increased RIGHT lung infiltrate. Findings called to patient's nurse Sarah RN in CCU on 11/13/2020 at 1110 hours. Electronically Signed   By: Lavonia Dana M.D.   On: 11/13/2020 11:21   DG Chest Port 1 View  Result Date: 11/13/2020 CLINICAL DATA:  Respiratory difficulty EXAM: PORTABLE CHEST 1 VIEW COMPARISON:  11/12/2020 FINDINGS: Heart size within normal limits. No pulmonary vascular congestion. Airspace opacity in the right lower lung likely due to pneumonitis. Lungs otherwise clear. No pneumothorax. IMPRESSION: Right basilar airspace opacity likely due to pneumonitis. Electronically Signed   By: Miachel Roux M.D.   On: 11/13/2020 09:15    DG Chest Port 1 View  Result Date: 11/12/2020 CLINICAL DATA:  Wheezing EXAM: PORTABLE CHEST 1 VIEW COMPARISON:  Chest x-ray 11/09/2020 FINDINGS: The heart and mediastinal contours are within normal limits. Patchy right lung airspace and interstitial opacity. No pulmonary edema. No pleural effusion. No pneumothorax. No acute osseous abnormality. IMPRESSION: Patchy right lung airspace and interstitial airspace opacity that may represent infection/inflammation. Electronically Signed   By: Iven Finn M.D.   On: 11/12/2020 15:05   DG Abd Portable 1V  Result Date: 11/13/2020 CLINICAL DATA:  Enteric tube placement EXAM: PORTABLE ABDOMEN - 1 VIEW COMPARISON:  None. FINDINGS: Tip of enteric tube is seen in the region of body of stomach. Bowel gas pattern is nonspecific. Arterial calcifications are seen. Right margin of abdominal aorta is projecting to the right of lumbar spine. This may be due to tortuosity or aneurysmal dilation of abdominal aorta. Degenerative changes are noted with disc space narrowing, bony spurs and facet hypertrophy in the lumbar spine. IMPRESSION: Tip and side port of enteric tube are noted within the stomach. Nonspecific bowel gas pattern. Calcification in the abdominal aorta is projecting slightly to the right of lumbar spine which may be due to tortuosity or aneurysmal dilation. Electronically Signed   By: Elmer Picker M.D.   On: 11/13/2020 11:03   EEG adult  Result Date: 11/13/2020 Derek Jack, MD     11/13/2020  6:59 PM Routine EEG Report Autumn [name] is a 70 y.o. female with a history of encephalopathy who is undergoing an EEG to evaluate for seizures. Report: This EEG was acquired with electrodes placed according to the International 10-20 electrode system (including Fp1, Fp2, F3, F4, C3, C4, P3, P4, O1, O2, T3, T4, T5, T6, A1, A2, Fz, Cz, Pz). The following electrodes were missing or displaced: none. The best background was 5-6 Hz. This activity was  near-continuous although  occasionally 1-2 seconds of intervening diffuse suppression is noted. This activity is reactive to stimulation. Drowsiness was manifested by background fragmentation; deeper stages of sleep were identified by K complexes and sleep spindles. There was no focal slowing. There were no interictal epileptiform discharges. There were no electrographic seizures identified. Photic stimulation and hyperventilation were not performed. Impression and clinical correlation: This EEG was obtained while sedated and asleep and is abnormal due to moderate diffuse slowing and occasional brief periods of intervening suppression both indicative of cerebral dysfunction, medication effect, or both. Su Monks, MD [facility] [phone] If 7pm- 7am, please page neurology on call as listed in [place].     Medications:    sodium chloride Stopped (11/11/20 0850)   fentaNYL infusion INTRAVENOUS Stopped (11/14/20 0809)   piperacillin-tazobactam (ZOSYN)  IV 2.25 g (11/14/20 0954)   propofol (DIPRIVAN) infusion Stopped (11/14/20 0809)    acetaminophen  650 mg Oral Once   allopurinol  200 mg Oral Daily   budesonide (PULMICORT) nebulizer solution  0.5 mg Nebulization BID   chlorhexidine gluconate (MEDLINE KIT)  15 mL Mouth Rinse BID   Chlorhexidine Gluconate Cloth  6 each Topical Daily   diltiazem  60 mg Per Tube Q6H   docusate  100 mg Per Tube BID   ferrous gluconate  324 mg Oral Q breakfast   insulin aspart  0-6 Units Subcutaneous Q4H   ipratropium-albuterol  3 mL Nebulization Q6H   levothyroxine  100 mcg Oral Q0600   mouth rinse  15 mL Mouth Rinse 10 times per day   metoprolol tartrate  25 mg Oral BID   multivitamin with minerals  1 tablet Oral Daily   nutrition supplement (JUVEN)  1 packet Oral BID BM   pantoprazole (PROTONIX) IV  40 mg Intravenous Daily   polyethylene glycol  17 g Per Tube Daily   Ensure Max Protein  11 oz Oral QHS   venlafaxine  100 mg Oral BID   sodium  chloride, [DISCONTINUED] acetaminophen **OR** acetaminophen, acetaminophen, fentaNYL, ipratropium-albuterol, meclizine, ondansetron **OR** ondansetron (ZOFRAN) IV  Assessment/ Plan:  Autumn [name] is a 70 y.o.  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  AKI likely secondary to ATN from hypotension and concurrent acute illness. Continue supportive care, Avoid NSAIDs and iv contrast  UOP of 1075 cc Electrolytes and Volume status are acceptable No acute indication for Dialysis at present       #Diabetes type 2 With CKD including proteinuria Outpatient patient was on lisinopril outpatient Currently held due to acute illness.       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    #Anemia of CKD Monitor hemoglobin closely Oral iron supplementation daily  # Acute respiratory failure Vent assisted  Plan for extubation as per ICU team    LOS: [place] 11/3/202210:09 AM

## 2020-11-14 NOTE — Progress Notes (Signed)
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 Patient has been following with a podiatrist at [facility] in the outpatient setting and reportedly had recently refused a left foot surgery secondary to her chronic wounds.   Clinical Impression   Ms. Autumn [name] was seen for OT/PT co-evaluation this date. Pt presents to acute OT demonstrating impaired ADL performance and functional mobility 2/2 decreased command following, functional strength/balance/ROM deficits and poor activity tolerance. Pt requires MAX A x2 sup<>sit. Pt tolerated ~ 5 min static sitting EOB, w/ CGA + varying UE support. R lateral lean noted as pt fatigued requiring cues to correct, R neglect noted. Anticipate MOD A for seated grooming task 2/2 decreased R grip strength and poor command following. Pt would benefit from skilled OT services to address noted impairments and functional limitations (see below for any additional details) in order to maximize safety and independence while minimizing falls risk and caregiver burden. Upon hospital discharge, recommend AIR to maximize pt safety and return to PLOF.       Recommendations for follow up therapy are one component of a multi-disciplinary discharge planning process, led by the attending physician.  Recommendations may be updated based on patient status, additional functional criteria and insurance authorization.   Follow Up Recommendations  Acute inpatient rehab (3hours/day)    Assistance Recommended at  Discharge Frequent or constant Supervision/Assistance  Functional Status Assessment  Patient has had a recent decline in their functional status and demonstrates the ability to make significant improvements in function in a reasonable and predictable amount of time.  Equipment Recommendations  Other (comment) (defer to next venue of care)    Recommendations for Other Services Rehab consult     Precautions / Restrictions Precautions Precautions: Fall Restrictions Weight Bearing Restrictions: Yes LLE Weight Bearing: Non weight bearing      Mobility Bed Mobility Overal bed mobility: Needs Assistance Bed Mobility: Supine to Sit;Sit to Supine     Supine to sit: Max assist;+2 for physical assistance Sit to supine: Max assist;+2 for physical assistance        Transfers                   General transfer comment: deferred 2/2 poor sitting tolerance      Balance Overall balance assessment: Needs assistance Sitting-balance support: Single extremity supported;Feet unsupported Sitting balance-Leahy Scale: Fair   Postural control: Right lateral lean                                 ADL either performed or assessed with clinical judgement   ADL Overall ADL's : Needs assistance/impaired                                       General ADL Comments: Pt tolerated ~ 5 min static sitting EOB, w/ CGA + varying UE support. R lateral lean noted as pt fatigued requiring cues to correct. Anticipate MOD A for seated grooming task  Vision   Vision Assessment?: Vision impaired- to be further tested in functional context Additional Comments: Neglect vs visual deficits            Pertinent Vitals/Pain Pain Assessment: Faces Faces Pain Scale: Hurts little more Pain Location: Head, RLE Pain Descriptors / Indicators: Grimacing Pain Intervention(s): Limited activity within patient's tolerance;Repositioned     Hand Dominance     Extremity/Trunk  Assessment Upper Extremity Assessment Upper Extremity Assessment: RUE deficits/detail;LUE deficits/detail RUE Deficits / Details: Grip 2-/5, RUE grossly 2-/5 with increased tone noted RUE Coordination: decreased fine motor;decreased gross motor LUE Deficits / Details: Grip 4/5, LUE grossly 2+/5   Lower Extremity Assessment Lower Extremity Assessment: Defer to PT evaluation       Communication Communication Communication: Expressive difficulties   Cognition Arousal/Alertness: Awake/alert Behavior During Therapy: Flat affect Overall Cognitive Status: Difficult to assess Area of Impairment: Following commands;Safety/judgement;Awareness                       Following Commands: Follows one step commands inconsistently Safety/Judgement: Decreased awareness of safety;Decreased awareness of deficits Awareness: Intellectual   General Comments: Pt intermittently follows one step commands. Right neglect noted. Response to painful stimuli.     General Comments  vitals WNL    Exercises Exercises: Other exercises Other Exercises Other Exercises: Pt educ re: importance of mvmt for functional mobility, neglect Other Exercises: sup<>sit, UE/LE assessment, static sitting   Shoulder Instructions      Home Living Family/patient expects to be discharged to:: Private residence Living Arrangements: Non-relatives/Friends                                      Prior Functioning/Environment                          OT Problem List: Decreased strength;Decreased range of motion;Decreased activity tolerance;Impaired balance (sitting and/or standing);Impaired vision/perception;Decreased coordination;Decreased cognition;Decreased safety awareness;Impaired sensation;Impaired UE functional use      OT Treatment/Interventions: Self-care/ADL training;Therapeutic exercise;Neuromuscular education;Energy conservation;DME and/or AE instruction;Therapeutic  activities;Cognitive remediation/compensation;Visual/perceptual remediation/compensation;Patient/family education;Balance training    OT Goals(Current goals can be found in the care plan section) Acute Rehab OT Goals OT Goal Formulation: Patient unable to participate in goal setting Time For Goal Achievement: 11/28/20 Potential to Achieve Goals: Good ADL Goals Pt Will Perform Grooming: with min assist;sitting (w/ cues prn) Pt/caregiver will Perform Home Exercise Program: Increased ROM;Increased strength;Both right and left upper extremity;With minimal assist Additional ADL Goal #1: Pt will follow 3/3 two-step commands with MOD cueing.  OT Frequency: Min 5X/week   Barriers to D/C:            Co-evaluation PT/OT/SLP Co-Evaluation/Treatment: Yes Reason for Co-Treatment: Complexity of the patient's impairments (multi-system involvement);Necessary to address cognition/behavior during functional activity;For patient/therapist safety;To address functional/ADL transfers PT goals addressed during session: Mobility/safety with mobility OT goals addressed during session: ADL's and self-care      AM-PAC OT "6 Clicks" Daily Activity     Outcome Measure Help from another person eating meals?: A Lot Help from another person taking care of personal grooming?: A Lot Help from another person toileting, which includes using toliet, bedpan, or urinal?: A Lot Help from another person bathing (including washing, rinsing, drying)?: A Lot Help from another person to put on and taking off regular upper body clothing?: A Lot Help from another person to put  on and taking off regular lower body clothing?: A Lot 6 Click Score: 12   End of Session Nurse Communication: Mobility status  Activity Tolerance: Patient tolerated treatment well;Patient limited by fatigue Patient left: in bed;with nursing/sitter in room  OT Visit Diagnosis: Other abnormalities of gait and mobility (R26.89);Hemiplegia and  hemiparesis;Muscle weakness (generalized) (M62.81) Hemiplegia - Right/Left: Right Hemiplegia - caused by: Cerebral infarction                Time: 7639-4320 OT Time Calculation (min): 29 min Charges:  OT General Charges $OT Visit: 1 Visit OT Evaluation $OT Eval High Complexity: 1 High  L[place]inks 11/14/2020, 4:38 PM

## 2020-11-14 NOTE — Progress Notes (Signed)
   PODIATRY PROGRESS NOTE  NAME Autumn [name] MRN [id number] DOB 1950-07-22 DOA 11/09/2020   CC: Diabetic foot infection LT Chief Complaint  Patient presents with   Fall     History of present illness: 70 y.o. female patient postop day 2 s/p Incision and drainage. 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 LT. DOS: 11/11/2020.  Patient is extubated  Able to follow commands.  No overnight events for foot and ankle.  Past Medical History:  Diagnosis Date   Diabetes mellitus without complication ([place])    Hypertension    Kidney stone    Thyroid disease     CBC Latest Ref Rng & Units 11/14/2020 11/13/2020 11/12/2020  WBC 4.0 - 10.5 K/uL 14.5(H) 15.9(H) 15.9(H)  Hemoglobin 12.0 - 15.0 g/dL 8.5(L) 8.5(L) 7.0(L)  Hematocrit 36.0 - 46.0 % 26.7(L) 26.7(L) 21.9(L)  Platelets 150 - 400 K/uL 195 220 189    BMP Latest Ref Rng & Units 11/14/2020 11/13/2020 11/12/2020  Glucose 70 - 99 mg/dL 183(H) 117(H) 131(H)  BUN 8 - 23 mg/dL 85(H) 87(H) 76(H)  Creatinine 0.44 - 1.00 mg/dL 4.91(H) 4.13(H) 3.88(H)  Sodium 135 - 145 mmol/L 133(L) 132(L) 133(L)  Potassium 3.5 - 5.1 mmol/L 4.5 4.1 4.5  Chloride 98 - 111 mmol/L 104 104 105  CO2 22 - 32 mmol/L 16(L) 18(L) 14(L)  Calcium 8.9 - 10.3 mg/dL 8.9 8.5(L) 8.7(L)            Physical Exam: Please see above noted photo.  Sutures intact dorsal.  Packing throughout the amputation site left intact today.  No evidence of vascular compromise at the moment.  No purulence.  No malodor.  Moderate serosanguineous drainage.  IMPRESSION: 1. Limited assessment as the patient could only tolerate a single diagnostic series to be performed. This is a small subset of the normal MRI ankle exam. Reduced diagnostic sensitivity and specificity. 2. Interval resection of the second metatarsal. Possible arthropathy between the lateral cuneiform and the base of the third metatarsal. No definite osteomyelitis in the ankle region. 3. Longitudinal tear of the peroneus brevis with  peroneus tenosynovitis. 4. Distal tibialis posterior tenosynovitis and tendinopathy. There is also extensor digitorum tenosynovitis. 5. Extensive subcutaneous edema circumferentially in the distal calf. Dorsal subcutaneous edema in the foot    ASSESSMENT/PLAN OF CARE 1.  S/P second ray amputation. I&D LT foot. DOS: 11/11/2020 -Dressings changed. - Will need to return to the OR at some point for further I&D, possible amputation but considering her neurologic changes and current status we can hold off on this until she is more stable medically - MRI limited however no osteomyelitic changes was noted near the amputation site.  No soft tissue emphysema noted.  No abscess noted. -Continue IV antibiotics, ID is following and managing - Dressing change was performed at bedside today. - We will continue to follow -NWB LLE

## 2020-11-14 NOTE — Progress Notes (Signed)
Progress Note  Patient Name: Autumn [name] Date of Encounter: 11/14/2020  Primary Cardiologist: End  Subjective   Recent stroke, left-sided deficits, difficulty speaking Otherwise alert Family friend at the bedside on the left Speech and swallow order placed Telemetry reviewed, atrial fibrillation rate in the 90s Heparin infusion on hold  Inpatient Medications    Scheduled Meds:  acetaminophen  650 mg Oral Once   allopurinol  200 mg Oral Daily   budesonide (PULMICORT) nebulizer solution  0.5 mg Nebulization BID   Chlorhexidine Gluconate Cloth  6 each Topical Daily   diltiazem  60 mg Per Tube Q6H   docusate  100 mg Per Tube BID   ferrous gluconate  324 mg Oral Q breakfast   insulin aspart  0-6 Units Subcutaneous Q4H   ipratropium-albuterol  3 mL Nebulization Q6H   levothyroxine  100 mcg Oral Q0600   mouth rinse  15 mL Mouth Rinse BID   metoprolol tartrate  25 mg Oral BID   multivitamin with minerals  1 tablet Oral Daily   nutrition supplement (JUVEN)  1 packet Oral BID BM   pantoprazole (PROTONIX) IV  40 mg Intravenous Daily   polyethylene glycol  17 g Per Tube Daily   Ensure Max Protein  11 oz Oral QHS   venlafaxine  100 mg Oral BID   Continuous Infusions:  sodium chloride Stopped (11/11/20 0850)   piperacillin-tazobactam (ZOSYN)  IV 2.25 g (11/14/20 0954)   sodium bicarbonate 150 mEq in D5W infusion     PRN Meds: sodium chloride, [DISCONTINUED] acetaminophen **OR** acetaminophen, acetaminophen, ipratropium-albuterol, meclizine, ondansetron **OR** ondansetron (ZOFRAN) IV   Vital Signs    Vitals:   11/14/20 0752 11/14/20 0800 11/14/20 0900 11/14/20 1000  BP:  (!) 104/59 112/81 114/87  Pulse:  (!) 54 60 (!) 109  Resp:  14 16 17   Temp:  97.9 F (36.6 C)    TempSrc:  Esophageal    SpO2: 96% 96% 95% 95%  Weight:      Height:        Intake/Output Summary (Last 24 hours) at 11/14/2020 1248 Last data filed at 11/14/2020 1100 Gross per 24 hour  Intake  192.9 ml  Output 875 ml  Net -682.1 ml   Filed Weights   11/09/20 1214  Weight: 86.2 kg    Telemetry    Afib, 80s to 90s bpm - Personally Reviewed  ECG    No new tracings - Personally Reviewed  Physical Exam   GEN: No acute distress.  Difficulty speaking Neck: Unable to estimate JVD. Cardiac:  irregularly irregular no murmurs, rubs, or gallops.  Respiratory: Clear to auscultation bilaterally.  GI: Soft, nontender, non-distended.   MS: No edema; No deformity. Neuro:  Alert , left-sided deficits Psych: No distress  Labs    Chemistry Recent Labs  Lab 11/09/20 1220 11/10/20 0649 11/12/20 0540 11/13/20 0203 11/14/20 0526  NA 132*   < > 133* 132* 133*  K 3.2*   < > 4.5 4.1 4.5  CL 102   < > 105 104 104  CO2 18*   < > 14* 18* 16*  GLUCOSE 164*   < > 131* 117* 183*  BUN 69*   < > 76* 87* 85*  CREATININE 3.65*   < > 3.88* 4.13* 4.91*  CALCIUM 8.4*   < > 8.7* 8.5* 8.9  PROT 6.4*  --   --   --   --   ALBUMIN 2.7*  --   --   --  2.2*  AST 35  --   --   --   --   ALT 25  --   --   --   --   ALKPHOS 84  --   --   --   --   BILITOT 0.4  --   --   --   --   GFRNONAA 13*   < > 12* 11* 9*  ANIONGAP 12   < > 14 10 13    < > = values in this interval not displayed.     Hematology Recent Labs  Lab 11/12/20 0540 11/13/20 0203 11/14/20 0526  WBC 15.9* 15.9* 14.5*  RBC 2.50* 3.13* 3.07*  HGB 7.0* 8.5* 8.5*  HCT 21.9* 26.7* 26.7*  MCV 87.6 85.3 87.0  MCH 28.0 27.2 27.7  MCHC 32.0 31.8 31.8  RDW 15.1 15.7* 15.9*  PLT 189 220 195    Cardiac EnzymesNo results for input(s): TROPONINI in the last 168 hours. No results for input(s): TROPIPOC in the last 168 hours.   BNP Recent Labs  Lab 11/09/20 1220  BNP 360.1*     DDimer No results for input(s): DDIMER in the last 168 hours.   Radiology    CT HEAD WO CONTRAST (5MM)  Result Date: 11/13/2020 CLINICAL DATA:  Mental status changes EXAM: CT HEAD WITHOUT CONTRAST TECHNIQUE: Contiguous axial images were obtained  from the base of the skull through the vertex without intravenous contrast. COMPARISON:  11/09/2020 FINDINGS: Brain: stable atrophy pattern and chronic white matter microvascular ischemic changes throughout both cerebral hemispheres. Limited with some motion artifact. No acute intracranial hemorrhage, mass lesion, new infarction, midline shift, herniation, hydrocephalus, or extra-axial fluid collection. No focal mass effect or edema. Cisterns are patent. Cerebellar atrophy as well. Vascular: Intracranial atherosclerosis at the skull base. No hyperdense vessel. Skull: Normal. Negative for fracture or focal lesion. Sinuses/Orbits: No acute finding. Other: None. IMPRESSION: Stable atrophy and white matter microvascular ischemic changes. No acute intracranial abnormality by noncontrast CT. Electronically Signed   By: Jerilynn Mages.  Shick M.D.   On: 11/13/2020 09:58   MR BRAIN WO CONTRAST  Result Date: 11/13/2020 IMPRESSION: Acute to early subacute infarction in the left posterior frontal cortical and subcortical brain, possibly affecting the precentral gyrus. No mass effect or hemorrhage. Chronic small-vessel ischemic changes elsewhere affecting the brainstem and cerebral hemispheric white matter, some associated with chronic punctate hemosiderin deposition. Electronically Signed   By: Nelson Chimes M.D.   On: 11/13/2020 16:36   MR ANKLE LEFT WO CONTRAST  Result Date: 11/13/2020 IMPRESSION: 1. Limited assessment as the patient could only tolerate a single diagnostic series to be performed. This is a small subset of the normal MRI ankle exam. Reduced diagnostic sensitivity and specificity. 2. Interval resection of the second metatarsal. Possible arthropathy between the lateral cuneiform and the base of the third metatarsal. No definite osteomyelitis in the ankle region. 3. Longitudinal tear of the peroneus brevis with peroneus tenosynovitis. 4. Distal tibialis posterior tenosynovitis and tendinopathy. There is also extensor  digitorum tenosynovitis. 5. Extensive subcutaneous edema circumferentially in the distal calf. Dorsal subcutaneous edema in the foot. Electronically Signed   By: Van Clines M.D.   On: 11/13/2020 07:33   DG Chest Port 1 View  Result Date: 11/14/2020 IMPRESSION: 1. Support lines and tubes as described. The endotracheal tube appears to terminate approximately 1 cm above the carina. 2. Cardiomegaly with perihilar and interstitial opacities compatible with pulmonary edema. Small right pleural effusion has increased in size. 3. Evolving  opacity in the right lung base is more conspicuous, and raises question for superimposed infection. Attention on follow-up films. Electronically Signed   By: Albin Felling M.D.   On: 11/14/2020 08:03   DG Chest Port 1 View  Result Date: 11/13/2020 IMPRESSION: Tip of endotracheal tube is at carina, recommend withdrawal 2-3 cm. Enlargement of cardiac silhouette with pulmonary vascular congestion and increased RIGHT lung infiltrate. Findings called to patient's nurse Sarah RN in CCU on 11/13/2020 at 1110 hours. Electronically Signed   By: Lavonia Dana M.D.   On: 11/13/2020 11:21   DG Chest Port 1 View  Result Date: 11/13/2020 IMPRESSION: Right basilar airspace opacity likely due to pneumonitis. Electronically Signed   By: Miachel Roux M.D.   On: 11/13/2020 09:15   DG Chest Port 1 View  Result Date: 11/12/2020 IMPRESSION: Patchy right lung airspace and interstitial airspace opacity that may represent infection/inflammation. Electronically Signed   By: Iven Finn M.D.   On: 11/12/2020 15:05   DG Abd Portable 1V  Result Date: 11/13/2020 MPRESSION: Tip and side port of enteric tube are noted within the stomach. Nonspecific bowel gas pattern. Calcification in the abdominal aorta is projecting slightly to the right of lumbar spine which may be due to tortuosity or aneurysmal dilation. Electronically Signed   By: Elmer Picker M.D.   On: 11/13/2020 11:03    EEG adult  Result Date: 11/13/2020 Derek Jack, MD     11/13/2020  6:59 PM Routine EEG Report Autumn [name] is a 70 y.o. female with a history of encephalopathy who is undergoing an EEG to evaluate for seizures. Report: This EEG was acquired with electrodes placed according to the International 10-20 electrode system (including Fp1, Fp2, F3, F4, C3, C4, P3, P4, O1, O2, T3, T4, T5, T6, A1, A2, Fz, Cz, Pz). The following electrodes were missing or displaced: none. The best background was 5-6 Hz. This activity was near-continuous although occasionally 1-2 seconds of intervening diffuse suppression is noted. This activity is reactive to stimulation. Drowsiness was manifested by background fragmentation; deeper stages of sleep were identified by K complexes and sleep spindles. There was no focal slowing. There were no interictal epileptiform discharges. There were no electrographic seizures identified. Photic stimulation and hyperventilation were not performed. Impression and clinical correlation: This EEG was obtained while sedated and asleep and is abnormal due to moderate diffuse slowing and occasional brief periods of intervening suppression both indicative of cerebral dysfunction, medication effect, or both. Su Monks, MD [facility] [phone] If 7pm- 7am, please page neurology on call as listed in [place].    Cardiac Studies   2D echo 11/10/2020: 1. Left ventricular ejection fraction, by estimation, is 55 to 60%. The  left ventricle has normal function. The left ventricle has no regional  wall motion abnormalities. There is mild concentric left ventricular  hypertrophy. Left ventricular diastolic  parameters are indeterminate. Elevated left ventricular end-diastolic  pressure.   2. Right ventricular systolic function is normal. The right ventricular  size is normal. There is normal pulmonary artery systolic pressure.   3. Left atrial size was severely dilated.   4. The  mitral valve is normal in structure. Trivial mitral valve  regurgitation. No evidence of mitral stenosis.   5. The aortic valve is tricuspid. Aortic valve regurgitation is not  visualized. No aortic stenosis is present.   6. The inferior vena cava is normal in size with greater than 50%  respiratory variability, suggesting right atrial pressure of 3  mmHg.   7. There is a small patent foramen ovale.  Patient Profile     Autumn [name] is a 70 y.o. female with a hx of HFpEF, CKD stage IV-V, diabetes since her late 40s to early 10s with diabetic neuropathy and diabetic foot ulcer, HTN, anemia of chronic disease, hepatic cirrhosis, sleep apnea, and gout who is being seen today for the evaluation of Afib with RVR   Assessment & Plan    1.  Atrial fibrillation with RVR Chronicity unclear, presenting with rate 130 up to 140 Treated with diltiazem infusion transition to short acting diltiazem and Lopressor -CHADS VASC 6 Acute stroke this admission, heparin on hold -If unable to tolerate oral medications, may need to transition back to diltiazem infusion  2.  Elevated troponin Demand ischemia in the setting of osteomyelitis, atrial fibrillation with RVR, chronic kidney disease, anemia of chronic disease, acute stroke -No plan for ischemic work-up at this time  3.  Sepsis/osteomyelitis On broad-spectrum antibiotics, Zosyn Worsening left foot chronic diabetic ulcer with osteo-, cellulitis on MRI Has had amputations of toes, I&D of abscess  4.  Acute stroke Rapid response, transferred to ICU, intubated November 2 with evolving neurologic changes, unresponsive Code stroke, MRI confirming acute stroke Followed by neurology   Total encounter time more than 35 minutes  Greater than 50% was spent in counseling and coordination of care with the patient  For questions or updates, please contact [place] Please consult [email or web address] for contact info under Cardiology/STEMI.     Signed, Christell Faith, PA-C [place] Pager: [phone] 11/14/2020, 12:48 PM

## 2020-11-15 ENCOUNTER — Inpatient Hospital Stay: Payer: Medicare (Managed Care)

## 2020-11-15 DIAGNOSIS — R6520 Severe sepsis without septic shock: Secondary | ICD-10-CM | POA: Diagnosis not present

## 2020-11-15 DIAGNOSIS — A419 Sepsis, unspecified organism: Secondary | ICD-10-CM | POA: Diagnosis not present

## 2020-11-15 DIAGNOSIS — I4891 Unspecified atrial fibrillation: Secondary | ICD-10-CM | POA: Diagnosis not present

## 2020-11-15 DIAGNOSIS — L02619 Cutaneous abscess of unspecified foot: Secondary | ICD-10-CM | POA: Diagnosis not present

## 2020-11-15 DIAGNOSIS — E1122 Type 2 diabetes mellitus with diabetic chronic kidney disease: Secondary | ICD-10-CM | POA: Diagnosis not present

## 2020-11-15 DIAGNOSIS — L03119 Cellulitis of unspecified part of limb: Secondary | ICD-10-CM | POA: Diagnosis not present

## 2020-11-15 DIAGNOSIS — N179 Acute kidney failure, unspecified: Secondary | ICD-10-CM | POA: Diagnosis not present

## 2020-11-15 DIAGNOSIS — I639 Cerebral infarction, unspecified: Secondary | ICD-10-CM | POA: Diagnosis not present

## 2020-11-15 DIAGNOSIS — R55 Syncope and collapse: Secondary | ICD-10-CM

## 2020-11-15 LAB — GLUCOSE, CAPILLARY
Glucose-Capillary: 142 mg/dL — ABNORMAL HIGH (ref 70–99)
Glucose-Capillary: 144 mg/dL — ABNORMAL HIGH (ref 70–99)
Glucose-Capillary: 136 mg/dL — ABNORMAL HIGH (ref 70–99)
Glucose-Capillary: 157 mg/dL — ABNORMAL HIGH (ref 70–99)
Glucose-Capillary: 166 mg/dL — ABNORMAL HIGH (ref 70–99)
Glucose-Capillary: 143 mg/dL — ABNORMAL HIGH (ref 70–99)

## 2020-11-15 LAB — CBC
HCT: 26.1 % — ABNORMAL LOW (ref 36.0–46.0)
Hemoglobin: 8.3 g/dL — ABNORMAL LOW (ref 12.0–15.0)
MCH: 27.9 pg (ref 26.0–34.0)
MCHC: 31.8 g/dL (ref 30.0–36.0)
MCV: 87.9 fL (ref 80.0–100.0)
Platelets: 197 10*3/uL (ref 150–400)
RBC: 2.97 MIL/uL — ABNORMAL LOW (ref 3.87–5.11)
RDW: 16.1 % — ABNORMAL HIGH (ref 11.5–15.5)
WBC: 12.8 10*3/uL — ABNORMAL HIGH (ref 4.0–10.5)
nRBC: 0.2 % (ref 0.0–0.2)

## 2020-11-15 LAB — BASIC METABOLIC PANEL
Anion gap: 13 (ref 5–15)
BUN: 106 mg/dL — ABNORMAL HIGH (ref 8–23)
CO2: 19 mmol/L — ABNORMAL LOW (ref 22–32)
Calcium: 8.3 mg/dL — ABNORMAL LOW (ref 8.9–10.3)
Chloride: 102 mmol/L (ref 98–111)
Creatinine, Ser: 5.23 mg/dL — ABNORMAL HIGH (ref 0.44–1.00)
GFR, Estimated: 8 mL/min — ABNORMAL LOW (ref >60)
Glucose, Bld: 147 mg/dL — ABNORMAL HIGH (ref 70–99)
Potassium: 3.9 mmol/L (ref 3.5–5.1)
Sodium: 134 mmol/L — ABNORMAL LOW (ref 135–145)

## 2020-11-15 LAB — CORTISOL-AM, BLOOD: Cortisol - AM: 15.8 ug/dL (ref 6.7–22.6)

## 2020-11-15 LAB — MAGNESIUM: Magnesium: 2.4 mg/dL (ref 1.7–2.4)

## 2020-11-15 MED ORDER — NEPRO/CARBSTEADY PO LIQD
237.0000 mL | Freq: Three times a day (TID) | ORAL | Status: DC
  Administered 2020-11-16: 237 mL via ORAL

## 2020-11-15 MED ORDER — VITAL 1.5 CAL PO LIQD
1000.0000 mL | ORAL | Status: DC
  Administered 2020-11-15 – 2020-11-18 (×3): 1000 mL

## 2020-11-15 MED ORDER — DILTIAZEM HCL 25 MG/5ML IV SOLN
5.0000 mg | Freq: Once | INTRAVENOUS | Status: AC
  Administered 2020-11-15: 5 mg via INTRAVENOUS
  Filled 2020-11-15: qty 5

## 2020-11-15 MED ORDER — DIAZEPAM 5 MG/ML IJ SOLN
2.5000 mg | Freq: Once | INTRAMUSCULAR | Status: AC
  Administered 2020-11-15: 2.5 mg via INTRAVENOUS
  Filled 2020-11-15: qty 2

## 2020-11-15 MED ORDER — FREE WATER
30.0000 mL | Status: DC
  Administered 2020-11-15 – 2020-11-18 (×20): 30 mL

## 2020-11-15 MED ORDER — ASCORBIC ACID 500 MG PO TABS
500.0000 mg | ORAL_TABLET | Freq: Two times a day (BID) | ORAL | Status: DC
  Administered 2020-11-15 – 2020-11-16 (×3): 500 mg via ORAL
  Filled 2020-11-15 (×3): qty 1

## 2020-11-15 MED ORDER — DILTIAZEM HCL 25 MG/5ML IV SOLN
5.0000 mg | INTRAVENOUS | Status: DC | PRN
  Administered 2020-11-15 – 2020-12-08 (×12): 5 mg via INTRAVENOUS
  Filled 2020-11-15 (×11): qty 5

## 2020-11-15 MED ORDER — HEPARIN (PORCINE) 25000 UT/250ML-% IV SOLN
1650.0000 [IU]/h | INTRAVENOUS | Status: DC
  Administered 2020-11-15: 1800 [IU]/h via INTRAVENOUS
  Administered 2020-11-16: 1650 [IU]/h via INTRAVENOUS
  Filled 2020-11-15 (×2): qty 250

## 2020-11-15 MED ORDER — DILTIAZEM HCL-DEXTROSE 125-5 MG/125ML-% IV SOLN (PREMIX)
5.0000 mg/h | INTRAVENOUS | Status: DC
  Administered 2020-11-15: 5 mg/h via INTRAVENOUS
  Filled 2020-11-15: qty 125

## 2020-11-15 MED ORDER — JUVEN PO PACK
1.0000 | PACK | Freq: Two times a day (BID) | ORAL | Status: DC
  Administered 2020-11-16 – 2020-11-20 (×8): 1

## 2020-11-15 MED ORDER — ALBUTEROL SULFATE (2.5 MG/3ML) 0.083% IN NEBU
2.5000 mg | INHALATION_SOLUTION | RESPIRATORY_TRACT | Status: DC | PRN
  Administered 2020-11-21 – 2020-12-21 (×11): 2.5 mg via RESPIRATORY_TRACT
  Filled 2020-11-15 (×12): qty 3

## 2020-11-15 MED ORDER — IPRATROPIUM-ALBUTEROL 0.5-2.5 (3) MG/3ML IN SOLN
3.0000 mL | Freq: Three times a day (TID) | RESPIRATORY_TRACT | Status: DC
  Administered 2020-11-15 – 2020-11-20 (×12): 3 mL via RESPIRATORY_TRACT
  Filled 2020-11-15 (×16): qty 3

## 2020-11-15 NOTE — Consult Note (Signed)
Pharmacy Antibiotic Note  Autumn [name] is a 70 y.o. female admitted on 11/09/2020 with  osteomyelitis .  Pharmacy has been consulted for Unasyn dosing. ID following.   Presented with left foot draining. PMH includes liver cirrhosis d/t NASH, HTN, thyroid disease, DM.   MRI of foot c/w osteomyelitis and abscess. Cultures of left foot abscess resulted, narrowing to Unasyn as per discussion with ID.   Plan: Ampicillin/sulbactam 3 grams every 12 hours  Monitor renal function, clinical course, and LOT.  Height: 5\' 6"  (167.6 cm) Weight: 86.2 kg (190 lb) IBW/kg (Calculated) : 59.3  Temp (24hrs), Avg:98.4 F (36.9 C), Min:97.9 F (36.6 C), Max:99.6 F (37.6 C)  Recent Labs  Lab 11/09/20 1220 11/09/20 1818 11/10/20 0649 11/11/20 0636 11/11/20 2122 11/12/20 0540 11/12/20 1551 11/13/20 0203 11/14/20 0526 11/15/20 0942  WBC 15.8*  --    < > 15.6*  --  15.9*  --  15.9* 14.5* 12.8*  CREATININE 3.65*  --    < > 3.65*  --  3.88*  --  4.13* 4.91* 5.23*  LATICACIDVEN 1.3 1.0  --   --   --   --   --   --   --   --   VANCORANDOM  --   --    < >  --  12  --  14  --   --   --    < > = values in this interval not displayed.     Estimated Creatinine Clearance: 11.4 mL/min (A) (by C-G formula based on SCr of 5.23 mg/dL (H)).    Allergies  Allergen Reactions   Codeine Nausea And Vomiting    Antimicrobials this admission: 10/29 Vancomycin >> 10/31 10/29 Cefepime >> 11/01 10/29 Metronidazole >> 11/01 11/02 Zosyn >> 11/3 11/3 Unasyn >>  Microbiology results: 10/30 MRSA PCR: (-)  10/31 Wound (2nd metatarsal): strep mitis/oralis, pasteurella 10/31 Abscess (left foot): MSSA, pasteurella 10/31 L metatarsal tissue: NGTD   Thank you for allowing pharmacy to be a part of this patient's care.   Wynelle Cleveland, PharmD Pharmacy Resident  11/15/2020 1:21 PM

## 2020-11-15 NOTE — Progress Notes (Signed)
PT Cancellation Note  Patient Details Name: Autumn [name] MRN: [id number] DOB: [birth year]   Cancelled Treatment:    Reason Eval/Treat Not Completed: Other (comment). Per chart review, patient noted to be started on cardizem infusion today. Will hold patient at this time and continue to follow, initiating Physical Therapy treatment follow up as medically appropriate (~24 hours from initiation from medication.)  Lieutenant Diego PT, DPT 1:11 PM,11/15/20

## 2020-11-15 NOTE — Evaluation (Signed)
Clinical/Bedside Swallow Evaluation Patient Details  Name: Autumn [name] MRN: [id number] Date of Birth: 1950/07/14  Today's Date: 11/15/2020 Time: SLP Start Time (ACUTE ONLY): 4034 SLP Stop Time (ACUTE ONLY): 1410 SLP Time Calculation (min) (ACUTE ONLY): 13 min  Past Medical History:  Past Medical History:  Diagnosis Date   Diabetes mellitus without complication ([place])    Hypertension    Kidney stone    Thyroid disease    Past Surgical History:  Past Surgical History:  Procedure Laterality Date   ABDOMINAL HYSTERECTOMY     AMPUTATION Left 11/11/2020   Procedure: AMPUTATION RAY - 2nd metatarsal and 3rd metatarsal head;  Surgeon: [name], DPM;  Location: [facility] ORS;  Service: Podiatry;  Laterality: Left;   URETERAL STENT PLACEMENT     HPI:  70 yo F with history of T2DM, CKD, 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  She'd been following with a [facility] podiatrist outpatient and had recently refused surgery.  She'd recently been treated with antibiotics at [facility] for the diabetic foot infection with enterobacter aerogans culture positive.  She was admitted on 10/29 due to falls at home with worsening L foot chronic diabetic ulcer with osteomyelitis of the 2nd and 3rd metatarsals and cellulitis noted on MRI.  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  Her post operative course was complicated by unresponsiveness and neurologic changes on 11/13/2021. She was found to have a stroke (MRI Brain showed acute to early subacute infarction in the left posterior frontal cortical and subcortical brain, possibly affecting the precentral gyrus) and was intubated for airway protection.  She was extubated 11/3 and transferred to [facility] service on 11/4.    Assessment / Plan / Recommendation  Clinical Impression  Pt presents with moderate oropharyngeal dysphagia that is further  complicated by cognitive linguistic deficits. When consuming thin liquids via cup and straw, pt intermittently held bolus in her mouth with pt's oral phase c/b anterior lingual movement during swallow that resulted in liquids coming out of her mouth followed by multiple swallows and immediate coughing across all trials. Anterior lingual movement was not observed with puree or nectar thick liquids trials. Pt also intermittently held nectar and puree boluses but when cued was able to trigger a swallow. Although she presents with right facial weakness, pocketing was not observed. Recommend initiating a diet of dysphagia 1 with nectar thick liquids via spoon. Given pt's current altered state, uncertain if pt will consume enough POs to maintain nutrition/hydration. ST to follow for diet toleration and possible advancement. SLP Visit Diagnosis: Dysphagia, oropharyngeal phase (R13.12);Cognitive communication deficit (R41.841)    Aspiration Risk  Moderate aspiration risk;Risk for inadequate nutrition/hydration    Diet Recommendation Dysphagia 1 (Puree);Nectar-thick liquid   Liquid Administration via: Spoon Medication Administration: Via alternative means Supervision: Full supervision/cueing for compensatory strategies Compensations: Minimize environmental distractions;Slow rate;Small sips/bites Postural Changes: Seated upright at 90 degrees;Remain upright for at least 30 minutes after po intake    Other  Recommendations Oral Care Recommendations: Oral care BID Other Recommendations: Order thickener from pharmacy;Prohibited food (jello, ice cream, thin soups);Remove water pitcher;Have oral suction available    Recommendations for follow up therapy are one component of a multi-disciplinary discharge planning process, led by the attending physician.  Recommendations may be updated based on patient status, additional functional criteria and insurance authorization.  Follow up Recommendations Skilled Nursing  facility  Frequency and Duration min 2x/week  2 weeks       Prognosis Prognosis for Safe Diet Advancement: Fair Barriers to Reach Goals: Cognitive deficits;Severity of deficits (wounds)      Swallow Study   General Date of Onset: 11/13/20 HPI: 70 yo F with history of T2DM, CKD, 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  She'd been following with a [facility] podiatrist outpatient and had recently refused surgery.  She'd recently been treated with antibiotics at [facility] for the diabetic foot infection with enterobacter aerogans culture positive.  She was admitted on 10/29 due to falls at home with worsening L foot chronic diabetic ulcer with osteomyelitis of the 2nd and 3rd metatarsals and cellulitis noted on MRI.  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  Her post operative course was complicated by unresponsiveness and neurologic changes on 11/13/2021. She was found to have a stroke (MRI Brain showed acute to early subacute infarction in the left posterior frontal cortical and subcortical brain, possibly affecting the precentral gyrus) and was intubated for airway protection.  She was extubated 11/3 and transferred to [facility] service on 11/4. Type of Study: Bedside Swallow Evaluation Previous Swallow Assessment: none in chart Diet Prior to this Study: NPO Temperature Spikes Noted: No Respiratory Status: Nasal cannula History of Recent Intubation: Yes Length of Intubations (days): 2 days Date extubated: 11/14/21 Behavior/Cognition: Alert;Cooperative;Confused;Distractible;Requires cueing;Doesn't follow directions Oral Cavity Assessment: Within Functional Limits Oral Care Completed by SLP: Recent completion by staff Self-Feeding Abilities: Total assist Patient Positioning: Upright in bed Baseline Vocal Quality: Aphonic Volitional Cough: Cognitively unable to  elicit Volitional Swallow: Unable to elicit    Oral/Motor/Sensory Function Overall Oral Motor/Sensory Function: Moderate impairment Facial ROM: Reduced right Facial Symmetry: Abnormal symmetry right Facial Strength: Reduced right Lingual ROM: Reduced right Lingual Symmetry: Abnormal symmetry right Lingual Strength: Reduced   Ice Chips Ice chips: Within functional limits Presentation: Spoon   Thin Liquid Thin Liquid: Impaired Presentation: Cup;Straw Oral Phase Impairments: Poor awareness of bolus (anterior lingual movement during swallow resulted in liquids coming out of mouth) Oral Phase Functional Implications: Oral holding Pharyngeal  Phase Impairments: Suspected delayed Swallow;Decreased hyoid-laryngeal movement;Multiple swallows;Cough - Immediate    Nectar Thick Nectar Thick Liquid: Within functional limits Presentation: Spoon   Honey Thick Honey Thick Liquid: Not tested   Puree Puree: Impaired Presentation: Spoon Oral Phase Impairments: Poor awareness of bolus Oral Phase Functional Implications: Oral holding;Prolonged oral transit   Solid     Solid: Not tested     Fe Okubo B. Rutherford Nail M.S., CCC-SLP, [place] Office [place] 11/15/2020,4:23 PM

## 2020-11-15 NOTE — Progress Notes (Signed)
Inpatient Rehab Admissions Coordinator:   Pt was screened for CIR candidacy by Shann Medal, PT, DPT.  Pt's insurance is not in network with [facility].  Will need TOC to refer to another AIR facility.    Shann Medal, PT, DPT Admissions Coordinator [phone] 11/15/20  9:54 AM

## 2020-11-15 NOTE — Evaluation (Signed)
Speech Language Pathology Evaluation Patient Details Name: Autumn [name] MRN: [id number] DOB: March 24, 1950 Today's Date: 11/15/2020 Time: 4098-1191 SLP Time Calculation (min) (ACUTE ONLY): 13 min  Problem List:  Patient Active Problem List   Diagnosis Date Noted   Atrial fibrillation with rapid ventricular response ([place])    Cellulitis and abscess of foot, except toes    Infectious tenosynovitis    Wheeze    AKI (acute kidney injury) ([place])    Anemia of chronic disease    Hypothyroidism    Atrial fibrillation with RVR ([place]) 11/09/2020   Osteomyelitis of foot, left, acute ([place]) 11/09/2020   CKD stage 4 due to type 2 diabetes mellitus ([place]) 11/09/2020   Liver cirrhosis secondary to NASH (nonalcoholic steatohepatitis) ([place]) 11/09/2020   Hypertension    History of anemia due to CKD    Sepsis ([place])    Frequent falls    Past Medical History:  Past Medical History:  Diagnosis Date   Diabetes mellitus without complication ([place])    Hypertension    Kidney stone    Thyroid disease    Past Surgical History:  Past Surgical History:  Procedure Laterality Date   ABDOMINAL HYSTERECTOMY     AMPUTATION Left 11/11/2020   Procedure: AMPUTATION RAY - 2nd metatarsal and 3rd metatarsal head;  Surgeon: [name], DPM;  Location: [facility] ORS;  Service: Podiatry;  Laterality: Left;   URETERAL STENT PLACEMENT     HPI:  70 yo F with history of T2DM, CKD, 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  She'd been following with a [facility] podiatrist outpatient and had recently refused surgery.  She'd recently been treated with antibiotics at [facility] for the diabetic foot infection with enterobacter aerogans culture positive.  She was admitted on 10/29 due to falls at home with worsening L foot chronic diabetic ulcer with osteomyelitis of the 2nd and 3rd metatarsals and cellulitis noted on MRI.  She's now s/p 2nd ray amputation and I&D of the  left foot with bone biopsy of the L 3rd metatarsal.  Her post operative course was complicated by unresponsiveness and neurologic changes on 11/13/2021. She was found to have a stroke (MRI Brain showed acute to early subacute infarction in the left posterior frontal cortical and subcortical brain, possibly affecting the precentral gyrus) and was intubated for airway protection.  She was extubated 11/3 and transferred to [facility] service on 11/4.   Assessment / Plan / Recommendation Clinical Impression  Pt presents with severe deficits in cognitive communication. Pt's speech is largely unintelligible d/t decreased vocal intensity, imprecise articulation that results in slurring of sounds. At times her speech appears garbled. There are times when pt doesn't respond to questions at all. while she was alert/awake pt had great difficulty sustaining attention to tasks at hand. She was not able to follow directions or respond accurately to questions. Her nonverbal expression was appropriate throughout. At this time, skilled ST intervention is required for further diagnostic assessment of impairments.    SLP Assessment  SLP Recommendation/Assessment: Patient needs continued Speech [place] Pathology Services SLP Visit Diagnosis: Cognitive communication deficit (R41.841)    Recommendations for follow up therapy are one component of a multi-disciplinary discharge planning process, led by the attending physician.  Recommendations may be updated based on patient status, additional functional criteria and insurance authorization.    Follow Up Recommendations  Skilled Nursing facility    Frequency and Duration min 2x/week  2 weeks  SLP Evaluation Cognition  Overall Cognitive Status: Impaired/Different from baseline Arousal/Alertness: Awake/alert Orientation Level: Oriented to person Attention: Sustained Sustained Attention: Impaired Sustained Attention Impairment: Verbal basic;Functional basic Problem  Solving: Impaired Problem Solving Impairment: Verbal basic;Functional basic Executive Function:  (all impaired by lower level deficits) Safety/Judgment: Impaired       Comprehension  Auditory Comprehension Overall Auditory Comprehension: Other (comment) (no response to yes/no unable to follow directions -) Reading Comprehension Reading Status: Not tested    Expression Expression Primary Mode of Expression: Verbal Verbal Expression Overall Verbal Expression: Impaired Initiation: Impaired Automatic Speech: Name Level of Generative/Spontaneous Verbalization: Word Written Expression Dominant Hand: Right Written Expression: Not tested   Oral / Motor  Oral Motor/Sensory Function Overall Oral Motor/Sensory Function: Moderate impairment Facial ROM: Reduced right Facial Symmetry: Abnormal symmetry right Facial Strength: Reduced right Lingual ROM: Reduced right Lingual Symmetry: Abnormal symmetry right Lingual Strength: Reduced Motor Speech Overall Motor Speech: Impaired Respiration: Within functional limits Phonation: Aphonic;Breathy;Low vocal intensity Resonance: Within functional limits Articulation: Impaired Level of Impairment: Word Intelligibility: Intelligibility reduced Word: 25-49% accurate Phrase: 0-24% accurate Sentence: 0-24% accurate Conversation: Not tested Motor Planning: Not tested Motor Speech Errors: Not applicable   GO                   Elih Mooney B. Rutherford Nail M.S., CCC-SLP, [place] Office [phone]  Stormy Fabian 11/15/2020, 4:32 PM

## 2020-11-15 NOTE — Consult Note (Addendum)
[place] for Heparin gtt Indication: atrial fibrillation  Allergies  Allergen Reactions   Codeine Nausea And Vomiting    Patient Measurements: Height: 5\' 6"  (167.6 cm) Weight: 86.2 kg (190 lb) IBW/kg (Calculated) : 59.3 Heparin Dosing Weight: 77.8 kg  Vital Signs: Temp: 98.7 F (37.1 C) (11/04 1600) Temp Source: Axillary (11/04 1600) BP: 141/91 (11/04 1800) Pulse Rate: 100 (11/04 1800)  Labs: Recent Labs    11/13/20 0203 11/13/20 0855 11/13/20 1654 11/14/20 0526 11/15/20 0942  HGB 8.5*  --   --  8.5* 8.3*  HCT 26.7*  --   --  26.7* 26.1*  PLT 220  --   --  195 197  HEPARINUNFRC  --  0.64 <0.10*  --   --   CREATININE 4.13*  --   --  4.91* 5.23*     Estimated Creatinine Clearance: 11.4 mL/min (A) (by C-G formula based on SCr of 5.23 mg/dL (H)).   Medical History: Past Medical History:  Diagnosis Date   Diabetes mellitus without complication ([place])    Hypertension    Kidney stone    Thyroid disease     Medications:  Scheduled:   acetaminophen  650 mg Oral Once   allopurinol  200 mg Oral Daily   vitamin C  500 mg Oral BID   budesonide (PULMICORT) nebulizer solution  0.5 mg Nebulization BID   Chlorhexidine Gluconate Cloth  6 each Topical Daily   diltiazem  60 mg Per Tube Q6H   docusate  100 mg Per Tube BID   feeding supplement (NEPRO CARB STEADY)  237 mL Oral TID BM   ferrous gluconate  324 mg Oral Q breakfast   free water  30 mL Per Tube Q4H   insulin aspart  0-6 Units Subcutaneous Q4H   ipratropium-albuterol  3 mL Nebulization TID   levothyroxine  100 mcg Oral Q0600   mouth rinse  15 mL Mouth Rinse BID   metoprolol tartrate  25 mg Oral BID   multivitamin with minerals  1 tablet Oral Daily   [START ON 11/16/2020] nutrition supplement (JUVEN)  1 packet Per Tube BID BM   pantoprazole (PROTONIX) IV  40 mg Intravenous Daily   polyethylene glycol  17 g Per Tube Daily   venlafaxine  100 mg Oral BID    Heparin Dosing  Weight: 77.8 kg  Assessment: Patient admitted with new onset A/Fib (CHADS-VASc - 6) and worsening renal function.  Antibiotics for osteomyelitis were initiated (s/p toe amputation & I/D of abscess) but post-op c/b unresponsiveness and neurochanges on 11/2 transferred to ICU.  Pt noted to have Patent Foramen Ovale and Afib, but anticoagulation initially held for further w/u of neurologic deficits.    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 There was no mass effect or hemorrhage and pharmacy consulted for heparin drip mgmt to resume once clear by Neurology on 11/4.  Noted history of DM, CKD-stage 4,HTN, thyroid disorder, and liver cirrhosis secondary to NASH  Date Time aPTT/HL Rate/Comment 10/30  0115 0.10  subtherapeutic  10/30   2051 0.18  subtherapeutic increase drip rate to 1800 units/hr 10/31  0704 0.43  Therapeutic x1; 1800 un/hr 11/02 0855 0.64  Therapeutic x2; 1800 un/hr  Goal of Therapy:  Heparin level 0.3-0.7 units/ml Monitor platelets by anticoagulation protocol: Yes   Plan:  Heparin cleared by neurology to restart, but with no bolus  at restart/initiation then dosing/bolusing per nomogram okay thereafter. Heparin level previously therapeutic x2 at rate of 1800 un/hr.  Will resume at 1800 units/hr.  Recheck heparin level in 8 hours.  CBC daily while on heparin.    Lorna Dibble, PharmD, [facility] Clinical Pharmacist 11/15/2020 6:48 PM

## 2020-11-15 NOTE — Progress Notes (Signed)
Date of Admission:  11/09/2020   T   ID: CIARAH PEACE is a 70 y.o. female  Principal Problem:   Sepsis ([place]) Active Problems:   Atrial fibrillation with RVR ([place])   Osteomyelitis of foot, left, acute ([place])   CKD stage 4 due to type 2 diabetes mellitus ([place])   Liver cirrhosis secondary to NASH (nonalcoholic steatohepatitis) ([place])   Hypertension   History of anemia due to CKD   Frequent falls   AKI (acute kidney injury) ([place])   Cellulitis and abscess of foot, except toes   Infectious tenosynovitis   [place]   Atrial fibrillation with rapid ventricular response ([facility])    Subjective: Pt awake  aphasia  Medications:   acetaminophen  650 mg Oral Once   allopurinol  200 mg Oral Daily   vitamin C  500 mg Oral BID   budesonide (PULMICORT) nebulizer solution  0.5 mg Nebulization BID   Chlorhexidine Gluconate Cloth  6 each Topical Daily   diltiazem  60 mg Per Tube Q6H   docusate  100 mg Per Tube BID   feeding supplement (NEPRO CARB STEADY)  237 mL Oral TID BM   ferrous gluconate  324 mg Oral Q breakfast   free water  30 mL Per Tube Q4H   insulin aspart  0-6 Units Subcutaneous Q4H   ipratropium-albuterol  3 mL Nebulization TID   levothyroxine  100 mcg Oral Q0600   mouth rinse  15 mL Mouth Rinse BID   metoprolol tartrate  25 mg Oral BID   multivitamin with minerals  1 tablet Oral Daily   [START ON 11/16/2020] nutrition supplement (JUVEN)  1 packet Per Tube BID BM   pantoprazole (PROTONIX) IV  40 mg Intravenous Daily   polyethylene glycol  17 g Per Tube Daily   venlafaxine  100 mg Oral BID    Objective: Vital signs in last 24 hours: Temp:  [97.9 F (36.6 C)-99.6 F (37.6 C)] 98 F (36.7 C) (11/04 1200) Pulse Rate:  [32-153] 108 (11/04 1500) Resp:  [15-18] 16 (11/04 1400) BP: (112-136)/(73-117) 128/73 (11/04 1500) SpO2:  [89 %-98 %] 94 % (11/04 1500)  PHYSICAL EXAM:  General: awake Aphasia Rt hemiparesis  Lungs: b/l air entry- decreased in the bases Heart:  irregular. Abdomen: Soft, non-tender,not distended. Bowel sounds normal. No masses Extremities: left foot dressing removed    Wound packed Skin: No rashes or lesions. Or bruising Lymph: Cervical, supraclavicular normal. Neurologic: rt hemiparesis  Lab Results Recent Labs    11/14/20 0526 11/15/20 0942  WBC 14.5* 12.8*  HGB 8.5* 8.3*  HCT 26.7* 26.1*  NA 133* 134*  K 4.5 3.9  CL 104 102  CO2 16* 19*  BUN 85* 106*  CREATININE 4.91* 5.23*   Liver Panel Recent Labs    11/14/20 0526  ALBUMIN 2.2*   Sedimentation Rate No results for input(s): ESRSEDRATE in the last 72 hours. C-Reactive Protein No results for input(s): CRP in the last 72 hours.  Microbiology: MRSA nares  not detected 11/13/20 [facility] Foot abscess culture Pasteurella, rare staph and strep mitis Studies/Results: DG Chest Port 1 View  Result Date: 11/15/2020 CLINICAL DATA:  Hypoxia, 70 yo F with history of T2DM, CKD, 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 EXAM: PORTABLE CHEST 1 VIEW COMPARISON:  11/14/2020 and older studies. FINDINGS: Since the prior exam, the endotracheal tube and the nasal/orogastric tube have been removed. Cardiac silhouette is normal in size. No  mediastinal or hilar masses. Vascular congestion has improved. Lung base opacities have cleared. Lungs are now clear. No pneumothorax.  Possible small effusions. Skeletal structures are grossly intact. IMPRESSION: 1. Significant improvement. There is residual vascular congestion, but no persistent interstitial or airspace edema. 2. Status post removal of the endotracheal tube and nasal/orogastric tube. Electronically Signed   By: Lajean Manes M.D.   On: 11/15/2020 15:58   DG Chest Port 1 View  Result Date: 11/14/2020 CLINICAL DATA:  Acute respiratory failure EXAM: PORTABLE CHEST 1 VIEW COMPARISON:  Previous day FINDINGS: Endotracheal tube terminates approximately 1 cm above the  carina. Esophageal temperature probe is present. There is enteric tube which terminates below the level of the diaphragm with the distal tip not visualized. The heart is enlarged. Stable appearance of widened vascular pedicle. Left the perihilar and interstitial opacities are present bilaterally. There is increased consolidative opacity in the right lower lung. Small right pleural effusion has increased in size. No acute osseous abnormality. IMPRESSION: 1. Support lines and tubes as described. The endotracheal tube appears to terminate approximately 1 cm above the carina. 2. Cardiomegaly with perihilar and interstitial opacities compatible with pulmonary edema. Small right pleural effusion has increased in size. 3. Evolving opacity in the right lung base is more conspicuous, and raises question for superimposed infection. Attention on follow-up films. Electronically Signed   By: Albin Felling M.D.   On: 11/14/2020 08:03   EEG adult  Result Date: 11/13/2020 Derek Jack, MD     11/13/2020  6:59 PM Routine EEG Report EIKO MCGOWEN is a 70 y.o. female with a history of encephalopathy who is undergoing an EEG to evaluate for seizures. Report: This EEG was acquired with electrodes placed according to the International 10-20 electrode system (including Fp1, Fp2, F3, F4, C3, C4, P3, P4, O1, O2, T3, T4, T5, T6, A1, A2, Fz, Cz, Pz). The following electrodes were missing or displaced: none. The best background was 5-6 Hz. This activity was near-continuous although occasionally 1-2 seconds of intervening diffuse suppression is noted. This activity is reactive to stimulation. Drowsiness was manifested by background fragmentation; deeper stages of sleep were identified by K complexes and sleep spindles. There was no focal slowing. There were no interictal epileptiform discharges. There were no electrographic seizures identified. Photic stimulation and hyperventilation were not performed. Impression and clinical  correlation: This EEG was obtained while sedated and asleep and is abnormal due to moderate diffuse slowing and occasional brief periods of intervening suppression both indicative of cerebral dysfunction, medication effect, or both. Su Monks, MD [facility] [phone] If 7pm- 7am, please page neurology on call as listed in [place].     Assessment/Plan:  Resp distress - was intubated, now extubated Altered mental status due to CVA MRI shows Acute to early subacute infarction in the left posterior frontal cortical and subcortical brain, possibly affecting the precentral gyrus. Has rt hemiparesis and expressive aphasia  Diabetes mellitus with Left foot ulcer with infection and sepsis  underwent I/D and 2nd ray excision polymicrobial Culture MSSA, pasteurella, strep mitis On unasyn     Severe anemia- got blood transfusion  AKI on CKD- worsening Avoid nephrotoxic drugs   DM    NASH leading to cirrhosis Rt heart failure   Hypothyroidism on synthroid-     Discussed the management with care team Will follow her peripherally this weekend

## 2020-11-15 NOTE — Progress Notes (Signed)
I have reviewed the patient's chart in epic and assessed the patient at bedside.  She is unable to communicate verbally.  I asked if it was okay if I spoke with her friend and she nodded her head yes.  She reached out her hand to shake mine and mouthed thank you.  I spoke with patient's friend [name] over the phone.  She was still at work and we scheduled to speak today again at or after 4 PM.  Curt Bears L. Ilsa Iha, FNP-BC Palliative Medicine Team Team Phone # [phone]  NO CHARGE

## 2020-11-15 NOTE — TOC Progression Note (Signed)
Transition of Care [facility]) - Progression Note    Patient Details  Name: Autumn [name] MRN: [id number] Date of Birth: 01/11/1951  Transition of Care [facility]) CM/SW [place], RN Phone Number: 11/15/2020, 4:13 PM  Clinical Narrative:  Received text from Caitlyn from [facility] CIR, informing me that patient Insurance will not cover [facility] CIR. Patient will need to get IR from [place], [facility], [facility], or [facility] with Palliative [facility] NP who is scheduled to see Friend today at 4pm and will discuss other options. Strup says that Friend did not have time to discuss due to other obligations and will discuss on Monday at 12:30pm.     Expected Discharge Plan: [place] Barriers to Discharge: Continued Medical Work up  Expected Discharge Plan and Services Expected Discharge Plan: [place] arrangements for the past 2 months: Single Family Home                                       Social Determinants of Health (SDOH) Interventions    Readmission Risk Interventions No flowsheet data found.

## 2020-11-15 NOTE — Progress Notes (Signed)
OT Cancellation Note  Patient Details Name: Autumn [name] MRN: [id number] DOB: 06/03/1950   Cancelled Treatment:    Reason Eval/Treat Not Completed: Medical issues which prohibited therapy.  Per chart review, patient noted to be started on cardizem infusion today. Will hold patient at this time and continue to follow POC as medically appropriate (~24 hours from initiation from medication.)  Dessie Coma, M.S. OTR/L  11/15/20, 1:32 PM  ascom [phone]

## 2020-11-15 NOTE — Progress Notes (Addendum)
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  She'd been following with Terrell Ostrand [facility] podiatrist outpatient and had recently refused surgery.  She'd recently been treated with antibiotics at [facility] for the diabetic foot infection with enterobacter aerogans culture positive.  She was admitted on 10/29 due to falls at home with worsening L foot chronic diabetic ulcer with osteomyelitis of the 2nd and 3rd metatarsals and cellulitis noted on MRI.  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  Her post operative course was complicated by unresponsiveness and neurologic changes, she was found to have Birdie Fetty stroke and was intubated for airway protection.  She was extubated 11/3 and transferred to [facility] service on 11/4.  See below for additional details 10/29: presented to [facility] ER s/p multiple falls, 5-weeks duration left foot pain, swelling, redness, hit head x2 during fall. ER workup: EKG showed rapid afib, CT head unremarkable, [place] Left LE no DVT, CXR unremarkable, XR L Foot concerning for osteomyelitis 2nd + 3rd metatarsals with soft tissue swelling of foot and ankle. Also with elevated BNP, elevated troponin, rapid Afib, concern for early sepsis, elevated BUN/cr acute renal failure on CKD (baseline Cr 3.19).  10/29: Admitted to floor, started on cardizem infusion for rate control,continuous heparin infusion for anticoagulation in setting of afib. IV Cefepime, vancomycin, flagyl initiated. Podiatry, cardiology, nephrology consulted. MRI left foot: septic arthritis of the second MTP joint and osteomyelitis of the second metatarsal and second proximal phalanx also with concern for an abscess and  cellulitis 10/31: Surgery completed: Left foot Amputation 2nd metatarsal and toe, I&D deep abscess multiple fascial planes, bone biopsy open deep third metatarsal. Note: Purulence found tracking up along the extensor tendons 11/1: ID consulted for infection mgmt; antibiotics adjusted to zosyn 11/2: Unable to complete MRI foot due to agitation despite IV ativan administration. Developed new confusion per RN which progressively worsened over the morning. Became unresponsive after coughing episode during pill swallowing; rapid response paged, stroke alert paged.  11/2: Transferred to ICU rm 10 after CT Head per stroke alert. Patient awake, attempting to follow simple commands however unable to lift arms on command. Tracking, responds to voice; however speech is garbled and unintelligible. When asked if in pain, patient nods and speaks but garbled. + oral secretions, concern for airway protection and patient was emergently intubated.  Notified patient's contact person [name], who arrived post intubation.  11/2: MRI Brain showed acute to early subacute infarction in the left posterior frontal cortical and subcortical brain, possibly affecting the precentral gyrus. There was no mass effect or hemorrhage. EEG with moderate diffuse slowing and occasional brief periods of intervening suppression, both indicative of cerebral dysfunction, medication effect, or both. No electrographic seizures or epileptiform discharges seen.  11/3: Remains MV, on propofol and fentanyl gtts. Turns head to voice, following limited commands.  11/3 extubated 11/4 TRH taking over  Assessment & Plan:   Principal Problem:   Sepsis ([place]) Active Problems:   Atrial fibrillation with RVR ([place])   Osteomyelitis of foot, left, acute ([place])   CKD stage 4 due to type 2 diabetes mellitus ([place])   Liver cirrhosis secondary to  NASH (nonalcoholic steatohepatitis) (H[facility])   Hypertension   History of anemia due to CKD   Frequent falls   AKI  (acute kidney injury) ([place])   Cellulitis and abscess of foot, except toes   Infectious tenosynovitis   [place]   Atrial fibrillation with rapid ventricular response [facility])  Stroke Neurology suspects periprocedural vs related to atrial fibrillation MRI brian with acute to early subacute infarction in the L posterior frontal cortical and subcortical brain, possibly affecting the precentral gyrus.  No mass effect or hemorrhage.  Chronic small vessel ischemic changes, some with chronic punctate hemosiderin deposition. Echo with EF 55-60%, dilated LA, patent foramen ovale She'll need completion of workup eventually with vascular imaging of head and neck, will defer timing to neurology (per neurology, once she's more stable) PT/OT/SLP Per neurology, benefits of resuming heparin likely outweigh risks of hemorrhagic conversion or hypertensive hemorrhage - will plan to resume heparin gtt   Acute Metabolic Encephalopathy Related to acute infection and above Delirium precautions Follow and w/u further as indicated  Septic Shock Diabetic Foot Infection Left Foot Osteomyelitis and Cellulitis  Septic Arthritis MRI foot with septic arthritis of second MTP joint and osteomyelitis of 2nd metatarsal head and second proximal phalanx, severe soft tissue swelling around second metatarsal neck concerning for phlegmon developing abscess measuring 2.1x1 cm circumferentially around distal shaft.  Mild early osteomyelitis of 3rd metatarsal head.  Severe soft tissue edema of the forefoot c/w cellulitis. MRI ankle limited -> interval resection of 2nd metatarsal, longitudinal tear of peroneus brevis with peroneus tenosynovitis, distal tibialis posterior tenosynovitis and tendinopathy, extensor digitorum tenosynovitis.  Extensive subcutaneous edema circumferentially in distal calf.  Dorsal subcutaneous edema in foot. S/p 2nd ray amputation, I&D, and bone bx of 3rd metatarsal on 11/1 Blood cx 11/2 NGx2 Surgical cx with  pasteurella multocida and rare strep mitis/oralis as well as MSSA Transitioned to unasyn per ID, appreciate assistance Podiatry following, needs to return to OR at some point for further I&D +/- amputation, but holding off with recent stroke NWB LLE  Acute Hypoxic Respiratory Failure Aspiration Pneumonia CXR 11/3 with right lung opacity concerning for superimposed infection - also with findings concerning for pulmonary edema and small right effusion Already on unasyn Resp cx with rare yeast, will follow final cx Repeat CXR 11/4, may be developing some overload with renal failure/pulm edema, not overtly overloaded on exam - hold off on diuresis for now with aki  Atrial Fibrillation with RVR Appreciate cardiology recommendations Planning to resume heparin per neurology On dilt infusion at this time  Dysphagia 2/2 stroke SLP eval  AKI on CKD IV Baseline creatinine around 3.2 Worsening, will follow per renal - thought 2/2 ATN Avoid nephrotoxins  Elevated Troponin Cardiology following, thought 2/2 demand  Hx NASH cirrhosis Noted  Hypothyroidism Synthroid  T2DM SSI, follow A1c 6.3  Anemia Stable Labs with iron def Follow B12, folate  DVT prophylaxis: SCD Code Status:full  Family Communication: none at bedside - called friend in chart Disposition:   Status is: Inpatient  Remains inpatient appropriate because: continued need for IV therapies, consultant care       Consultants:  PCCM Nephrology Neurology ID Podiatry cardiology  Procedures:  EEG Impression and clinical correlation: This EEG was obtained while sedated and asleep and is abnormal due to moderate diffuse slowing and occasional brief periods of intervening suppression both indicative of cerebral dysfunction, medication effect, or both. Echo IMPRESSIONS     1. Left ventricular ejection fraction, by estimation, is 55 to 60%.  The  left ventricle has normal function. The left ventricle has no  regional  wall motion abnormalities. There is mild concentric left ventricular  hypertrophy. Left ventricular diastolic  parameters are indeterminate. Elevated left ventricular end-diastolic  pressure.   2. Right ventricular systolic function is normal. The right ventricular  size is normal. There is normal pulmonary artery systolic pressure.   3. Left atrial size was severely dilated.   4. The mitral valve is normal in structure. Trivial mitral valve  regurgitation. No evidence of mitral stenosis.   5. The aortic valve is tricuspid. Aortic valve regurgitation is not  visualized. No aortic stenosis is present.   6. The inferior vena cava is normal in size with greater than 50%  respiratory variability, suggesting right atrial pressure of 3 mmHg.   7. There is Kayron Hicklin small patent foramen ovale.  Procedures:             1) amputation ray second metatarsal and toe             2) incision and drainage deep abscess multiple fascial planes             3) bone biopsy open deep third metatarsal Antimicrobials:  Anti-infectives (From admission, onward)    Start     Dose/Rate Route Frequency Ordered Stop   11/14/20 2200  Ampicillin-Sulbactam (UNASYN) 3 g in sodium chloride 0.9 % 100 mL IVPB        3 g 200 mL/hr over 30 Minutes Intravenous Every 12 hours 11/14/20 1610     11/14/20 0900  piperacillin-tazobactam (ZOSYN) IVPB 2.25 g  Status:  Discontinued        2.25 g 100 mL/hr over 30 Minutes Intravenous Every 8 hours 11/14/20 0805 11/14/20 1609   11/13/20 2200  piperacillin-tazobactam (ZOSYN) IVPB 3.375 g  Status:  Discontinued        3.375 g 12.5 mL/hr over 240 Minutes Intravenous Every 12 hours 11/13/20 0758 11/14/20 0805   11/13/20 0600  piperacillin-tazobactam (ZOSYN) IVPB 2.25 g        2.25 g 100 mL/hr over 30 Minutes Intravenous Every 8 hours 11/12/20 2155 11/13/20 1725   11/11/20 1600  vancomycin (VANCOCIN) IVPB 1000 mg/200 mL premix  Status:  Discontinued        1,000 mg 200 mL/hr over  60 Minutes Intravenous Every 48 hours 11/09/20 1618 11/10/20 1045   11/11/20 1558  vancomycin (VANCOCIN) powder  Status:  Discontinued          As needed 11/11/20 1558 11/11/20 1558   11/10/20 2000  vancomycin (VANCOCIN) IVPB 1000 mg/200 mL premix  Status:  Discontinued        1,000 mg 200 mL/hr over 60 Minutes Intravenous Every 48 hours 11/10/20 1918 11/12/20 0940   11/09/20 1800  ceFEPIme (MAXIPIME) 2 g in sodium chloride 0.9 % 100 mL IVPB  Status:  Discontinued        2 g 200 mL/hr over 30 Minutes Intravenous Every 24 hours 11/09/20 1619 11/12/20 2146   11/09/20 1619  vancomycin variable dose per unstable renal function (pharmacist dosing)  Status:  Discontinued         Does not apply See admin instructions 11/09/20 1619 11/12/20 1521   11/09/20 1615  vancomycin (VANCOREADY) IVPB 1750 mg/350 mL        1,750 mg 175 mL/hr over 120 Minutes Intravenous  Once 11/09/20 1606 11/09/20 1947   11/09/20 1600  metroNIDAZOLE (FLAGYL) IVPB 500 mg  Status:  Discontinued  500 mg 100 mL/hr over 60 Minutes Intravenous Every 8 hours 11/09/20 1555 11/12/20 2146       Subjective: Shakes head yes/no, aphasic  Objective: Vitals:   11/15/20 1100 11/15/20 1200 11/15/20 1300 11/15/20 1316  BP: 122/81     Pulse: (!) 125 92 (!) 32 (!) 111  Resp: 18 16 15 16   Temp:  98 F (36.7 C)    TempSrc:  Axillary    SpO2: 97% 94% 98% 95%  Weight:      Height:        Intake/Output Summary (Last 24 hours) at 11/15/2020 1453 Last data filed at 11/15/2020 1300 Gross per 24 hour  Intake 1911.9 ml  Output 460 ml  Net 1451.9 ml   Filed Weights   11/09/20 1214  Weight: 86.2 kg    Examination:  General exam: Appears calm and comfortable  Respiratory system: unlabored Cardiovascular system: tachy, irreg irreg Gastrointestinal system: Abdomen is nondistended, soft and nontender Central nervous system: follows commands, but aphasic, moving all extremities Extremities: L foot with intact  dressing Psychiatry: Judgement and insight appear normal. Mood & affect appropriate.     Data Reviewed: I have personally reviewed following labs and imaging studies  CBC: Recent Labs  Lab 11/09/20 1220 11/10/20 0649 11/11/20 0636 11/12/20 0540 11/13/20 0203 11/14/20 0526 11/15/20 0942  WBC 15.8*   < > 15.6* 15.9* 15.9* 14.5* 12.8*  NEUTROABS 13.5*  --   --   --   --   --   --   HGB 8.0*   < > 7.4* 7.0* 8.5* 8.5* 8.3*  HCT 24.5*   < > 22.6* 21.9* 26.7* 26.7* 26.1*  MCV 86.3   < > 86.3 87.6 85.3 87.0 87.9  PLT 190   < > 185 189 220 195 197   < > = values in this interval not displayed.    Basic Metabolic Panel: Recent Labs  Lab 11/09/20 1818 11/10/20 4174 11/11/20 0636 11/12/20 0540 11/13/20 0203 11/14/20 0526 11/15/20 0942  NA  --    < > 132* 133* 132* 133* 134*  K  --    < > 4.0 4.5 4.1 4.5 3.9  CL  --    < > 104 105 104 104 102  CO2  --    < > 18* 14* 18* 16* 19*  GLUCOSE  --    < > 133* 131* 117* 183* 147*  BUN  --    < > 72* 76* 87* 85* 106*  CREATININE  --    < > 3.65* 3.88* 4.13* 4.91* 5.23*  CALCIUM  --    < > 8.6* 8.7* 8.5* 8.9 8.3*  MG 2.0  --   --   --   --   --  2.4  PHOS  --   --   --   --   --  7.8*  --    < > = values in this interval not displayed.    GFR: Estimated Creatinine Clearance: 11.4 mL/min ([place]) (by C-G formula based on SCr of 5.23 mg/dL (H)).  Liver Function Tests: Recent Labs  Lab 11/09/20 1220 11/14/20 0526  AST 35  --   ALT 25  --   ALKPHOS 84  --   BILITOT 0.4  --   PROT 6.4*  --   ALBUMIN 2.7* 2.2*    CBG: Recent Labs  Lab 11/14/20 1906 11/14/20 2309 11/15/20 0343 11/15/20 0739 11/15/20 1122  GLUCAP 156* 140* 142* 144* 136*  Recent Results (from the past 240 hour(s))  Resp Panel by RT-PCR (Flu Annamae Shivley&B, Covid) Nasopharyngeal Swab     Status: None   Collection Time: 11/09/20  6:25 PM   Specimen: Nasopharyngeal Swab; Nasopharyngeal(NP) swabs in vial transport medium  Result Value Ref Range Status   SARS  Coronavirus 2 by RT PCR NEGATIVE NEGATIVE Final    Comment: (NOTE) SARS-CoV-2 target nucleic acids are NOT DETECTED.  The SARS-CoV-2 RNA is generally detectable in upper respiratory specimens during the acute phase of infection. The lowest concentration of SARS-CoV-2 viral copies this assay can detect is 138 copies/mL. Ricka Westra negative result does not preclude SARS-Cov-2 infection and should not be used as the sole basis for treatment or other patient management decisions. Lusine Corlett negative result may occur with  improper specimen collection/handling, submission of specimen other than nasopharyngeal swab, presence of viral mutation(s) within the areas targeted by this assay, and inadequate number of viral copies(<138 copies/mL). Natisha Trzcinski negative result must be combined with clinical observations, patient history, and epidemiological information. The expected result is Negative.  Fact Sheet for Patients:  EntrepreneurPulse.com.au  Fact Sheet for Healthcare Providers:  IncredibleEmployment.be  This test is no t yet approved or cleared by the [place] FDA and  has been authorized for detection and/or diagnosis of SARS-CoV-2 by FDA under an Emergency Use Authorization (EUA). This EUA will remain  in effect (meaning this test can be used) for the duration of the COVID-19 declaration under Section 564(b)(1) of the Act, 21 U.S.C.section 360bbb-3(b)(1), unless the authorization is terminated  or revoked sooner.       Influenza Aeden Matranga by PCR NEGATIVE NEGATIVE Final   Influenza B by PCR NEGATIVE NEGATIVE Final    Comment: (NOTE) The Xpert Xpress SARS-CoV-2/FLU/RSV plus assay is intended as an aid in the diagnosis of influenza from Nasopharyngeal swab specimens and should not be used as Nycole Kawahara sole basis for treatment. Nasal washings and aspirates are unacceptable for Xpert Xpress SARS-CoV-2/FLU/RSV testing.  Fact Sheet for  Patients: EntrepreneurPulse.com.au  Fact Sheet for Healthcare Providers: IncredibleEmployment.be  This test is not yet approved or cleared by the [place] FDA and has been authorized for detection and/or diagnosis of SARS-CoV-2 by FDA under an Emergency Use Authorization (EUA). This EUA will remain in effect (meaning this test can be used) for the duration of the COVID-19 declaration under Section 564(b)(1) of the Act, 21 U.S.C. section 360bbb-3(b)(1), unless the authorization is terminated or revoked.  Performed at [facility], [address] [place], [place] [address]   MRSA Next Gen by PCR, Nasal     Status: None   Collection Time: 11/10/20  1:14 AM   Specimen: Nasal Mucosa; Nasal Swab  Result Value Ref Range Status   MRSA by PCR Next Gen NOT DETECTED NOT DETECTED Final    Comment: (NOTE) The GeneXpert MRSA Assay (FDA approved for NASAL specimens only), is one component of Rufino Staup comprehensive MRSA colonization surveillance program. It is not intended to diagnose MRSA infection nor to guide or monitor treatment for MRSA infections. Test performance is not FDA approved in patients less than 18 years old. Performed at [facility], [address] [place], [place] [address]   Aerobic/Anaerobic Culture w Gram Stain (surgical/deep wound)     Status: None (Preliminary result)   Collection Time: 11/11/20  3:23 PM   Specimen: Foot, Left; Abscess  Result Value Ref Range Status   Specimen Description   Final    WOUND LEFT FOOT ABSCESS Performed at [facility], [address], [place], [place] [address]    Special Requests   Final    NONE Performed at [facility], [place], [place] [address]    Gram Stain   Final    NO ORGANISMS SEEN SQUAMOUS EPITHELIAL CELLS PRESENT MODERATE WBC PRESENT, PREDOMINANTLY MONONUCLEAR MODERATE GRAM POSITIVE COCCI    Culture   Final    RARE  STAPHYLOCOCCUS AUREUS RARE PASTEURELLA MULTOCIDA Usually susceptible to penicillin and other beta lactam agents,quinolones,macrolides and tetracyclines. HOLDING FOR POSSIBLE ANAEROBE Performed at [facility], [place] [address], [place], [place] [address]    Report Status PENDING  Incomplete   Organism ID, Bacteria STAPHYLOCOCCUS AUREUS  Final      Susceptibility   Staphylococcus aureus - MIC*    CIPROFLOXACIN <=0.5 SENSITIVE Sensitive     ERYTHROMYCIN <=0.25 SENSITIVE Sensitive     GENTAMICIN <=0.5 SENSITIVE Sensitive     OXACILLIN <=0.25 SENSITIVE Sensitive     TETRACYCLINE <=1 SENSITIVE Sensitive     VANCOMYCIN <=0.5 SENSITIVE Sensitive     TRIMETH/SULFA <=10 SENSITIVE Sensitive     CLINDAMYCIN <=0.25 SENSITIVE Sensitive     RIFAMPIN <=0.5 SENSITIVE Sensitive     Inducible Clindamycin NEGATIVE Sensitive     * RARE STAPHYLOCOCCUS AUREUS  Aerobic/Anaerobic Culture w Gram Stain (surgical/deep wound)     Status: None (Preliminary result)   Collection Time: 11/11/20  3:32 PM   Specimen: Other Source; Tissue  Result Value Ref Range Status   Specimen Description   Final    WOUND 2ND METATARSAL Performed at [facility], [address] [place], [place] [address]    Special Requests   Final    NONE Performed at [facility] [address] [place], [place] [address]    Gram Stain   Final    NO ORGANISMS SEEN SQUAMOUS EPITHELIAL CELLS PRESENT FEW WBC PRESENT, PREDOMINANTLY MONONUCLEAR FEW GRAM POSITIVE COCCI    Culture   Final    RARE STREPTOCOCCUS MITIS/ORALIS RARE PASTEURELLA MULTOCIDA Usually susceptible to penicillin and other beta lactam agents,quinolones,macrolides and tetracyclines. ABUNDANT BACTEROIDES SPECIES BETA LACTAMASE POSITIVE Performed at [facility], [place] [address], [place], [place] [address]    Report Status PENDING  Incomplete   Organism ID, Bacteria STREPTOCOCCUS MITIS/ORALIS  Final      Susceptibility   Streptococcus  mitis/oralis - MIC*    TETRACYCLINE 0.5 SENSITIVE Sensitive     VANCOMYCIN 0.5 SENSITIVE Sensitive     CLINDAMYCIN <=0.25 SENSITIVE Sensitive     * RARE STREPTOCOCCUS MITIS/ORALIS  Aerobic/Anaerobic Culture w Gram Stain (surgical/deep wound)     Status: None (Preliminary result)   Collection Time: 11/11/20  4:26 PM   Specimen: Other Source; Tissue  Result Value Ref Range Status   Specimen Description   Final    WOUND LEFT METATARSAL Performed at [facility], [address], [place], [place] [address]    Special Requests   Final    NONE Performed at [facility], [place], [place] [address]    Gram Stain NO WBC SEEN NO ORGANISMS SEEN   Final   Culture   Final    NO GROWTH 4 DAYS NO ANAEROBES ISOLATED; CULTURE IN PROGRESS FOR 5 DAYS Performed at [place] [address] [place], [place] [address]    Report Status PENDING  Incomplete  MRSA Next Gen by PCR, Nasal     Status: None   Collection Time: 11/13/20  11:22 AM   Specimen: Nasal Mucosa; Nasal Swab  Result Value Ref Range Status   MRSA by PCR Next Gen NOT DETECTED NOT DETECTED Final    Comment: (NOTE) The GeneXpert MRSA Assay (FDA approved for NASAL specimens only), is one component of Jaylean Buenaventura comprehensive MRSA colonization surveillance program. It is not intended to diagnose MRSA infection nor to guide or monitor treatment for MRSA infections. Test performance is not FDA approved in patients less than 77 years old. Performed at [facility], [address] [place], [place] [address]   CULTURE, BLOOD (ROUTINE X 2) w Reflex to ID Panel     Status: None (Preliminary result)   Collection Time: 11/13/20 12:44 PM   Specimen: BLOOD  Result Value Ref Range Status   Specimen Description BLOOD LEFT ANTECUBITAL  Final   Special Requests   Final    BOTTLES DRAWN AEROBIC AND ANAEROBIC Blood Culture adequate volume   Culture   Final    NO GROWTH 2 DAYS Performed at [facility], [address] [place], [place] [address]    Report Status PENDING  Incomplete  CULTURE, BLOOD (ROUTINE X 2) w Reflex to ID Panel     Status: None (Preliminary result)   Collection Time: 11/13/20 12:52 PM   Specimen: BLOOD  Result Value Ref Range Status   Specimen Description BLOOD BLOOD LEFT HAND  Final   Special Requests   Final    BOTTLES DRAWN AEROBIC ONLY Blood Culture results may not be optimal due to an inadequate volume of blood received in culture bottles   Culture   Final    NO GROWTH 2 DAYS Performed at [facility], [address], [place], [place] [address]    Report Status PENDING  Incomplete  Culture, Respiratory w Gram Stain     Status: None (Preliminary result)   Collection Time: 11/13/20  3:43 PM   Specimen: Tracheal Aspirate; Respiratory  Result Value Ref Range Status   Specimen Description   Final    TRACHEAL ASPIRATE Performed at [facility], [address] [place], [place] [address]    Special Requests   Final    NONE Performed at [facility], [address] [place], [place] [address]    Gram Stain   Final    NO SQUAMOUS EPITHELIAL CELLS SEEN FEW WBC SEEN NO ORGANISMS SEEN    Culture   Final    RARE YEAST CULTURE REINCUBATED FOR BETTER GROWTH Performed at [facility], [place] [address] [place], [place] [address]    Report Status PENDING  Incomplete         Radiology Studies: MR BRAIN WO CONTRAST  Result Date: 11/13/2020 CLINICAL DATA:  Mental status changes of unknown cause. Recent fall at home. EXAM: MRI HEAD WITHOUT CONTRAST TECHNIQUE: Multiplanar, multiecho pulse sequences of the brain and surrounding structures were obtained without intravenous contrast. COMPARISON:  Head CT 11/13/2020 FINDINGS: Brain: Diffusion imaging shows an acute to subacute infarction affecting the left posterior frontal cortical and subcortical brain, potentially the precentral gyrus. No other acute finding. No sign  of hemorrhage or mass effect associated with that. Otherwise, there chronic small-vessel ischemic changes of the pons. Cerebral hemispheres show moderate chronic small-vessel ischemic changes of the white matter. No mass lesion, hemorrhage, hydrocephalus or extra-axial collection. Few punctate foci of hemosiderin deposition noted in the pons and cerebral hemispheres. Vascular: Major vessels at the base of the brain show flow. Skull and upper cervical spine: Negative  Sinuses/Orbits: Clear/normal Other: None IMPRESSION: Acute to early subacute infarction in the left posterior frontal cortical and subcortical brain, possibly affecting the precentral gyrus. No mass effect or hemorrhage. Chronic small-vessel ischemic changes elsewhere affecting the brainstem and cerebral hemispheric white matter, some associated with chronic punctate hemosiderin deposition. Electronically Signed   By: Nelson Chimes M.D.   On: 11/13/2020 16:36   DG Chest Port 1 View  Result Date: 11/14/2020 CLINICAL DATA:  Acute respiratory failure EXAM: PORTABLE CHEST 1 VIEW COMPARISON:  Previous day FINDINGS: Endotracheal tube terminates approximately 1 cm above the carina. Esophageal temperature probe is present. There is enteric tube which terminates below the level of the diaphragm with the distal tip not visualized. The heart is enlarged. Stable appearance of widened vascular pedicle. Left the perihilar and interstitial opacities are present bilaterally. There is increased consolidative opacity in the right lower lung. Small right pleural effusion has increased in size. No acute osseous abnormality. IMPRESSION: 1. Support lines and tubes as described. The endotracheal tube appears to terminate approximately 1 cm above the carina. 2. Cardiomegaly with perihilar and interstitial opacities compatible with pulmonary edema. Small right pleural effusion has increased in size. 3. Evolving opacity in the right lung base is more conspicuous, and raises  question for superimposed infection. Attention on follow-up films. Electronically Signed   By: Albin Felling M.D.   On: 11/14/2020 08:03   EEG adult  Result Date: 11/13/2020 Derek Jack, MD     11/13/2020  6:59 PM Routine EEG Report FELISIA BALCOM is [place] 70 y.o. female with Correna Meacham history of encephalopathy who is undergoing an EEG to evaluate for seizures. Report: This EEG was acquired with electrodes placed according to the International 10-20 electrode system (including Fp1, Fp2, F3, F4, C3, C4, P3, P4, O1, O2, T3, T4, T5, T6, A1, A2, Fz, Cz, Pz). The following electrodes were missing or displaced: none. The best background was 5-6 Hz. This activity was near-continuous although occasionally 1-2 seconds of intervening diffuse suppression is noted. This activity is reactive to stimulation. Drowsiness was manifested by background fragmentation; deeper stages of sleep were identified by K complexes and sleep spindles. There was no focal slowing. There were no interictal epileptiform discharges. There were no electrographic seizures identified. Photic stimulation and hyperventilation were not performed. Impression and clinical correlation: This EEG was obtained while sedated and asleep and is abnormal due to moderate diffuse slowing and occasional brief periods of intervening suppression both indicative of cerebral dysfunction, medication effect, or both. Su Monks, MD [facility] [phone] If 7pm- 7am, please page neurology on call as listed in [place].        Scheduled Meds:  acetaminophen  650 mg Oral Once   allopurinol  200 mg Oral Daily   budesonide (PULMICORT) nebulizer solution  0.5 mg Nebulization BID   Chlorhexidine Gluconate Cloth  6 each Topical Daily   diltiazem  60 mg Per Tube Q6H   docusate  100 mg Per Tube BID   ferrous gluconate  324 mg Oral Q breakfast   insulin aspart  0-6 Units Subcutaneous Q4H   ipratropium-albuterol  3 mL Nebulization TID   levothyroxine  100  mcg Oral Q0600   mouth rinse  15 mL Mouth Rinse BID   metoprolol tartrate  25 mg Oral BID   multivitamin with minerals  1 tablet Oral Daily   pantoprazole (PROTONIX) IV  40 mg Intravenous Daily   polyethylene glycol  17 g Per Tube Daily   venlafaxine  100 mg Oral BID   Continuous Infusions:  sodium chloride Stopped (11/11/20 0850)   ampicillin-sulbactam (UNASYN) IV Stopped (11/15/20 1006)   diltiazem (CARDIZEM) infusion 15 mg/hr (11/15/20 1300)   sodium bicarbonate 150 mEq in D5W infusion 75 mL/hr at 11/15/20 1300     LOS: 6 days    Time spent: over 30 min    Fayrene Helper, MD [facility]   To contact the attending provider between 7A-7P or the covering provider during after hours 7P-7A, please log into the web site [email or web address] and access using universal [place] password for that web site. If you do not have the password, please call the hospital operator.  11/15/2020, 2:53 PM

## 2020-11-15 NOTE — Progress Notes (Signed)
[facility]  ROUNDING NOTE   Subjective:   Extubated this am, alert No sedation, expressive aphagia Cardizem drip  Creatine increased. Recorded urine output of 631ml in 24 hours   Objective:  Vital signs in last 24 hours:  Temp:  [97.9 F (36.6 C)-99.6 F (37.6 C)] 99.6 F (37.6 C) (11/04 0400) Pulse Rate:  [25-153] 128 (11/04 0600) Resp:  [12-17] 16 (11/04 0600) BP: (113-127)/(72-102) 113/90 (11/04 0600) SpO2:  [89 %-98 %] 95 % (11/04 0600)  Weight change:  Filed Weights   11/09/20 1214  Weight: 86.2 kg    Intake/Output: I/O last 3 completed shifts: In: 1186.5 [I.V.:1036; IV Piggyback:150.5] Out: 785 [Urine:785]   Intake/Output this shift:  No intake/output data recorded.  Physical Exam: General: NAD  Head: Atraumatic. Moist oral mucosal membranes  Eyes: Anicteric,   Lungs:  Basilar crackles, normal effort, [place] O2  Heart: Irregular rhythm  Abdomen:  Soft, nontender  Extremities:  no peripheral edema.  Surgical dressing on left foot  Neurologic: Alert and able to follow commands   Skin: Warm        Basic Metabolic Panel: Recent Labs  Lab 11/09/20 1818 11/10/20 0649 11/11/20 0636 11/12/20 0540 11/13/20 0203 11/14/20 0526  NA  --  132* 132* 133* 132* 133*  K  --  3.5 4.0 4.5 4.1 4.5  CL  --  103 104 105 104 104  CO2  --  18* 18* 14* 18* 16*  GLUCOSE  --  152* 133* 131* 117* 183*  BUN  --  64* 72* 76* 87* 85*  CREATININE  --  3.50* 3.65* 3.88* 4.13* 4.91*  CALCIUM  --  8.2* 8.6* 8.7* 8.5* 8.9  MG 2.0  --   --   --   --   --   PHOS  --   --   --   --   --  7.8*     Liver Function Tests: Recent Labs  Lab 11/09/20 1220 11/14/20 0526  AST 35  --   ALT 25  --   ALKPHOS 84  --   BILITOT 0.4  --   PROT 6.4*  --   ALBUMIN 2.7* 2.2*    No results for input(s): LIPASE, AMYLASE in the last 168 hours. No results for input(s): AMMONIA in the last 168 hours.  CBC: Recent Labs  Lab 11/09/20 1220 11/10/20 0649 11/11/20 0636  11/12/20 0540 11/13/20 0203 11/14/20 0526  WBC 15.8* 13.5* 15.6* 15.9* 15.9* 14.5*  NEUTROABS 13.5*  --   --   --   --   --   HGB 8.0* 7.2* 7.4* 7.0* 8.5* 8.5*  HCT 24.5* 22.3* 22.6* 21.9* 26.7* 26.7*  MCV 86.3 85.4 86.3 87.6 85.3 87.0  PLT 190 180 185 189 220 195     Cardiac Enzymes: Recent Labs  Lab 11/09/20 1818  CKTOTAL 184     BNP: Invalid input(s): POCBNP  CBG: Recent Labs  Lab 11/14/20 1551 11/14/20 1906 11/14/20 2309 11/15/20 0343 11/15/20 0739  GLUCAP 147* 156* 140* 142* 144*     Microbiology: Results for orders placed or performed during the hospital encounter of 11/09/20  Resp Panel by RT-PCR (Flu A&B, Covid) Nasopharyngeal Swab     Status: None   Collection Time: 11/09/20  6:25 PM   Specimen: Nasopharyngeal Swab; Nasopharyngeal(NP) swabs in vial transport medium  Result Value Ref Range Status   SARS Coronavirus 2 by RT PCR NEGATIVE NEGATIVE Final    Comment: (NOTE) SARS-CoV-2 target nucleic acids  are NOT DETECTED.  The SARS-CoV-2 RNA is generally detectable in upper respiratory specimens during the acute phase of infection. The lowest concentration of SARS-CoV-2 viral copies this assay can detect is 138 copies/mL. A negative result does not preclude SARS-Cov-2 infection and should not be used as the sole basis for treatment or other patient management decisions. A negative result may occur with  improper specimen collection/handling, submission of specimen other than nasopharyngeal swab, presence of viral mutation(s) within the areas targeted by this assay, and inadequate number of viral copies(<138 copies/mL). A negative result must be combined with clinical observations, patient history, and epidemiological information. The expected result is Negative.  Fact Sheet for Patients:  EntrepreneurPulse.com.au  Fact Sheet for Healthcare Providers:  IncredibleEmployment.be  This test is no t yet approved or  cleared by the [place] FDA and  has been authorized for detection and/or diagnosis of SARS-CoV-2 by FDA under an Emergency Use Authorization (EUA). This EUA will remain  in effect (meaning this test can be used) for the duration of the COVID-19 declaration under Section 564(b)(1) of the Act, 21 U.S.C.section 360bbb-3(b)(1), unless the authorization is terminated  or revoked sooner.       Influenza A by PCR NEGATIVE NEGATIVE Final   Influenza B by PCR NEGATIVE NEGATIVE Final    Comment: (NOTE) The Xpert Xpress SARS-CoV-2/FLU/RSV plus assay is intended as an aid in the diagnosis of influenza from Nasopharyngeal swab specimens and should not be used as a sole basis for treatment. Nasal washings and aspirates are unacceptable for Xpert Xpress SARS-CoV-2/FLU/RSV testing.  Fact Sheet for Patients: EntrepreneurPulse.com.au  Fact Sheet for Healthcare Providers: IncredibleEmployment.be  This test is not yet approved or cleared by the [place] FDA and has been authorized for detection and/or diagnosis of SARS-CoV-2 by FDA under an Emergency Use Authorization (EUA). This EUA will remain in effect (meaning this test can be used) for the duration of the COVID-19 declaration under Section 564(b)(1) of the Act, 21 U.S.C. section 360bbb-3(b)(1), unless the authorization is terminated or revoked.  Performed at [facility], [address] [place], [place] [address]   MRSA Next Gen by PCR, Nasal     Status: None   Collection Time: 11/10/20  1:14 AM   Specimen: Nasal Mucosa; Nasal Swab  Result Value Ref Range Status   MRSA by PCR Next Gen NOT DETECTED NOT DETECTED Final    Comment: (NOTE) The GeneXpert MRSA Assay (FDA approved for NASAL specimens only), is one component of a comprehensive MRSA colonization surveillance program. It is not intended to diagnose MRSA infection nor to guide or monitor treatment for MRSA infections. Test  performance is not FDA approved in patients less than 42 years old. Performed at [facility], [address] [place], [place] [address]   Aerobic/Anaerobic Culture w Gram Stain (surgical/deep wound)     Status: None (Preliminary result)   Collection Time: 11/11/20  3:23 PM   Specimen: Foot, Left; Abscess  Result Value Ref Range Status   Specimen Description   Final    WOUND LEFT FOOT ABSCESS Performed at [facility], [address] [place], [place] [address]    Special Requests   Final    NONE Performed at [facility], [address] [place], [place] [address]    Gram Stain   Final    NO ORGANISMS SEEN SQUAMOUS EPITHELIAL CELLS PRESENT MODERATE WBC PRESENT, PREDOMINANTLY MONONUCLEAR MODERATE GRAM POSITIVE COCCI    Culture   Final  RARE STAPHYLOCOCCUS AUREUS RARE PASTEURELLA MULTOCIDA Usually susceptible to penicillin and other beta lactam agents,quinolones,macrolides and tetracyclines. HOLDING FOR POSSIBLE ANAEROBE Performed at [facility], [place] [address] [place], [place] [address]    Report Status PENDING  Incomplete   Organism ID, Bacteria STAPHYLOCOCCUS AUREUS  Final      Susceptibility   Staphylococcus aureus - MIC*    CIPROFLOXACIN <=0.5 SENSITIVE Sensitive     ERYTHROMYCIN <=0.25 SENSITIVE Sensitive     GENTAMICIN <=0.5 SENSITIVE Sensitive     OXACILLIN <=0.25 SENSITIVE Sensitive     TETRACYCLINE <=1 SENSITIVE Sensitive     VANCOMYCIN <=0.5 SENSITIVE Sensitive     TRIMETH/SULFA <=10 SENSITIVE Sensitive     CLINDAMYCIN <=0.25 SENSITIVE Sensitive     RIFAMPIN <=0.5 SENSITIVE Sensitive     Inducible Clindamycin NEGATIVE Sensitive     * RARE STAPHYLOCOCCUS AUREUS  Aerobic/Anaerobic Culture w Gram Stain (surgical/deep wound)     Status: None (Preliminary result)   Collection Time: 11/11/20  3:32 PM   Specimen: Other Source; Tissue  Result Value Ref Range Status   Specimen Description   Final    WOUND 2ND  METATARSAL Performed at [facility], [address] [place], [place] [address]    Special Requests   Final    NONE Performed at [facility], [address] [place], [place] [address]    Gram Stain   Final    NO ORGANISMS SEEN SQUAMOUS EPITHELIAL CELLS PRESENT FEW WBC PRESENT, PREDOMINANTLY MONONUCLEAR FEW GRAM POSITIVE COCCI    Culture   Final    RARE STREPTOCOCCUS MITIS/ORALIS RARE PASTEURELLA MULTOCIDA Usually susceptible to penicillin and other beta lactam agents,quinolones,macrolides and tetracyclines. SUSCEPTIBILITIES TO FOLLOW HOLDING FOR POSSIBLE ANAEROBE Performed at [facility], [place] [address] [place], [place] [address]    Report Status PENDING  Incomplete  Aerobic/Anaerobic Culture w Gram Stain (surgical/deep wound)     Status: None (Preliminary result)   Collection Time: 11/11/20  4:26 PM   Specimen: Other Source; Tissue  Result Value Ref Range Status   Specimen Description   Final    WOUND LEFT METATARSAL Performed at [facility], [address] [place], [place] [address]    Special Requests   Final    NONE Performed at [facility], [place], [place] [address]    Gram Stain NO WBC SEEN NO ORGANISMS SEEN   Final   Culture   Final    NO GROWTH 3 DAYS NO ANAEROBES ISOLATED; CULTURE IN PROGRESS FOR 5 DAYS Performed at [facility] [address], [place], [place] [address]    Report Status PENDING  Incomplete  MRSA Next Gen by PCR, Nasal     Status: None   Collection Time: 11/13/20 11:22 AM   Specimen: Nasal Mucosa; Nasal Swab  Result Value Ref Range Status   MRSA by PCR Next Gen NOT DETECTED NOT DETECTED Final    Comment: (NOTE) The GeneXpert MRSA Assay (FDA approved for NASAL specimens only), is one component of a comprehensive MRSA colonization surveillance program. It is not intended to diagnose MRSA infection nor to guide or monitor treatment for MRSA infections. Test  performance is not FDA approved in patients less than 25 years old. Performed at [facility], [address] [place], [place] [address]   CULTURE, BLOOD (ROUTINE X 2) w Reflex to ID Panel     Status: None (Preliminary result)   Collection Time: 11/13/20 12:44 PM  Specimen: BLOOD  Result Value Ref Range Status   Specimen Description BLOOD LEFT ANTECUBITAL  Final   Special Requests   Final    BOTTLES DRAWN AEROBIC AND ANAEROBIC Blood Culture adequate volume   Culture   Final    NO GROWTH 2 DAYS Performed at [facility], [address] [place], [place] [address]    Report Status PENDING  Incomplete  CULTURE, BLOOD (ROUTINE X 2) w Reflex to ID Panel     Status: None (Preliminary result)   Collection Time: 11/13/20 12:52 PM   Specimen: BLOOD  Result Value Ref Range Status   Specimen Description BLOOD BLOOD LEFT HAND  Final   Special Requests   Final    BOTTLES DRAWN AEROBIC ONLY Blood Culture results may not be optimal due to an inadequate volume of blood received in culture bottles   Culture   Final    NO GROWTH 2 DAYS Performed at [facility], [address], [place], [place] [address]    Report Status PENDING  Incomplete  Culture, Respiratory w Gram Stain     Status: None (Preliminary result)   Collection Time: 11/13/20  3:43 PM   Specimen: Tracheal Aspirate; Respiratory  Result Value Ref Range Status   Specimen Description   Final    TRACHEAL ASPIRATE Performed at [facility], [address], [place], [place] [address]    Special Requests   Final    NONE Performed at [facility], [address] [place], [place] [address]    Gram Stain   Final    NO SQUAMOUS EPITHELIAL CELLS SEEN FEW WBC SEEN NO ORGANISMS SEEN    Culture   Final    RARE YEAST CULTURE REINCUBATED FOR BETTER GROWTH Performed at [facility], [place] [address] [place], [place] [address]    Report Status PENDING  Incomplete    Coagulation  Studies: No results for input(s): LABPROT, INR in the last 72 hours.   Urinalysis: No results for input(s): COLORURINE, LABSPEC, PHURINE, GLUCOSEU, HGBUR, BILIRUBINUR, KETONESUR, PROTEINUR, UROBILINOGEN, NITRITE, LEUKOCYTESUR in the last 72 hours.  Invalid input(s): APPERANCEUR     Imaging: CT HEAD WO CONTRAST (5MM)  Result Date: 11/13/2020 CLINICAL DATA:  Mental status changes EXAM: CT HEAD WITHOUT CONTRAST TECHNIQUE: Contiguous axial images were obtained from the base of the skull through the vertex without intravenous contrast. COMPARISON:  11/09/2020 FINDINGS: Brain: stable atrophy pattern and chronic white matter microvascular ischemic changes throughout both cerebral hemispheres. Limited with some motion artifact. No acute intracranial hemorrhage, mass lesion, new infarction, midline shift, herniation, hydrocephalus, or extra-axial fluid collection. No focal mass effect or edema. Cisterns are patent. Cerebellar atrophy as well. Vascular: Intracranial atherosclerosis at the skull base. No hyperdense vessel. Skull: Normal. Negative for fracture or focal lesion. Sinuses/Orbits: No acute finding. Other: None. IMPRESSION: Stable atrophy and white matter microvascular ischemic changes. No acute intracranial abnormality by noncontrast CT. Electronically Signed   By: Jerilynn Mages.  Shick M.D.   On: 11/13/2020 09:58   MR BRAIN WO CONTRAST  Result Date: 11/13/2020 CLINICAL DATA:  Mental status changes of unknown cause. Recent fall at home. EXAM: MRI HEAD WITHOUT CONTRAST TECHNIQUE: Multiplanar, multiecho pulse sequences of the brain and surrounding structures were obtained without intravenous contrast. COMPARISON:  Head CT 11/13/2020 FINDINGS: Brain: Diffusion imaging shows an acute to subacute infarction affecting the left posterior frontal cortical and subcortical brain, potentially the precentral gyrus. No other acute finding. No sign of hemorrhage or mass effect associated  with that. Otherwise, there chronic  small-vessel ischemic changes of the pons. Cerebral hemispheres show moderate chronic small-vessel ischemic changes of the white matter. No mass lesion, hemorrhage, hydrocephalus or extra-axial collection. Few punctate foci of hemosiderin deposition noted in the pons and cerebral hemispheres. Vascular: Major vessels at the base of the brain show flow. 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 No mass effect or hemorrhage. Chronic small-vessel ischemic changes elsewhere affecting the brainstem and cerebral hemispheric white matter, some associated with chronic punctate hemosiderin deposition. Electronically Signed   By: Nelson Chimes M.D.   On: 11/13/2020 16:36   DG Chest Port 1 View  Result Date: 11/14/2020 CLINICAL DATA:  Acute respiratory failure EXAM: PORTABLE CHEST 1 VIEW COMPARISON:  Previous day FINDINGS: Endotracheal tube terminates approximately 1 cm above the carina. Esophageal temperature probe is present. There is enteric tube which terminates below the level of the diaphragm with the distal tip not visualized. The heart is enlarged. Stable appearance of widened vascular pedicle. Left the perihilar and interstitial opacities are present bilaterally. There is increased consolidative opacity in the right lower lung. Small right pleural effusion has increased in size. No acute osseous abnormality. IMPRESSION: 1. Support lines and tubes as described. The endotracheal tube appears to terminate approximately 1 cm above the carina. 2. Cardiomegaly with perihilar and interstitial opacities compatible with pulmonary edema. Small right pleural effusion has increased in size. 3. Evolving opacity in the right lung base is more conspicuous, and raises question for superimposed infection. Attention on follow-up films.  Electronically Signed   By: Albin Felling M.D.   On: 11/14/2020 08:03   DG Chest Port 1 View  Result Date: 11/13/2020 CLINICAL DATA:  Intubation EXAM: PORTABLE CHEST 1 VIEW COMPARISON:  Portable exam 1046 hours compared to 0907 hours FINDINGS: Tip of endotracheal tube is at carina directed toward RIGHT mainstem bronchus, recommend withdrawal 2-3 cm. Enlargement of cardiac silhouette with vascular congestion. RIGHT lung infiltrate slightly increased from previous exam. Mild RIGHT basilar atelectasis. No pleural effusion or pneumothorax. IMPRESSION: Tip of endotracheal tube is at carina, recommend withdrawal 2-3 cm. Enlargement of cardiac silhouette with pulmonary vascular congestion and increased RIGHT lung infiltrate. Findings called to patient's nurse Sarah RN in CCU on 11/13/2020 at 1110 hours. Electronically Signed   By: Lavonia Dana M.D.   On: 11/13/2020 11:21   DG Chest Port 1 View  Result Date: 11/13/2020 CLINICAL DATA:  Respiratory difficulty EXAM: PORTABLE CHEST 1 VIEW COMPARISON:  11/12/2020 FINDINGS: Heart size within normal limits. No pulmonary vascular congestion. Airspace opacity in the right lower lung likely due to pneumonitis. Lungs otherwise clear. No pneumothorax. IMPRESSION: Right basilar airspace opacity likely due to pneumonitis. Electronically Signed   By: Miachel Roux M.D.   On: 11/13/2020 09:15   DG Abd Portable 1V  Result Date: 11/13/2020 CLINICAL DATA:  Enteric tube placement EXAM: PORTABLE ABDOMEN - 1 VIEW COMPARISON:  None. FINDINGS: Tip of enteric tube is seen in the region of body of stomach. Bowel gas pattern is nonspecific. Arterial calcifications are seen. Right margin of abdominal aorta is projecting to the right of lumbar spine. This may be due to tortuosity or aneurysmal dilation of abdominal aorta. Degenerative changes are noted with disc space narrowing, bony spurs and facet hypertrophy in the lumbar spine. IMPRESSION: Tip and side port of enteric tube are noted  within the  stomach. Nonspecific bowel gas pattern. Calcification in the abdominal aorta is projecting slightly to the right of lumbar spine which may be due to tortuosity or aneurysmal dilation. Electronically Signed   By: Elmer Picker M.D.   On: 11/13/2020 11:03   EEG adult  Result Date: 11/13/2020 Derek Jack, MD     11/13/2020  6:59 PM Routine EEG Report Autumn [name] is a 70 y.o. female with a history of encephalopathy who is undergoing an EEG to evaluate for seizures. Report: This EEG was acquired with electrodes placed according to the International 10-20 electrode system (including Fp1, Fp2, F3, F4, C3, C4, P3, P4, O1, O2, T3, T4, T5, T6, A1, A2, Fz, Cz, Pz). The following electrodes were missing or displaced: none. The best background was 5-6 Hz. This activity was near-continuous although occasionally 1-2 seconds of intervening diffuse suppression is noted. This activity is reactive to stimulation. Drowsiness was manifested by background fragmentation; deeper stages of sleep were identified by K complexes and sleep spindles. There was no focal slowing. There were no interictal epileptiform discharges. There were no electrographic seizures identified. Photic stimulation and hyperventilation were not performed. Impression and clinical correlation: This EEG was obtained while sedated and asleep and is abnormal due to moderate diffuse slowing and occasional brief periods of intervening suppression both indicative of cerebral dysfunction, medication effect, or both. Su Monks, MD [facility] [phone] If 7pm- 7am, please page neurology on call as listed in [place].     Medications:    sodium chloride Stopped (11/11/20 0850)   ampicillin-sulbactam (UNASYN) IV Stopped (11/14/20 2140)   diltiazem (CARDIZEM) infusion     sodium bicarbonate 150 mEq in D5W infusion 75 mL/hr at 11/15/20 0459    acetaminophen  650 mg Oral Once   allopurinol  200 mg Oral Daily   budesonide  (PULMICORT) nebulizer solution  0.5 mg Nebulization BID   Chlorhexidine Gluconate Cloth  6 each Topical Daily   diltiazem  60 mg Per Tube Q6H   docusate  100 mg Per Tube BID   ferrous gluconate  324 mg Oral Q breakfast   insulin aspart  0-6 Units Subcutaneous Q4H   ipratropium-albuterol  3 mL Nebulization TID   levothyroxine  100 mcg Oral Q0600   mouth rinse  15 mL Mouth Rinse BID   metoprolol tartrate  25 mg Oral BID   multivitamin with minerals  1 tablet Oral Daily   nutrition supplement (JUVEN)  1 packet Oral BID BM   pantoprazole (PROTONIX) IV  40 mg Intravenous Daily   polyethylene glycol  17 g Per Tube Daily   Ensure Max Protein  11 oz Oral QHS   venlafaxine  100 mg Oral BID   sodium chloride, [DISCONTINUED] acetaminophen **OR** acetaminophen, acetaminophen, diltiazem, ipratropium-albuterol, meclizine, ondansetron **OR** ondansetron (ZOFRAN) IV  Assessment/ Plan:  Autumn [name] is a 70 y.o.  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  AKI likely secondary to ATN from hypotension and concurrent acute illness. Continue supportive care, Avoid NSAIDs and iv contrast  UOP of 667ml Electrolytes and Volume status are stable No acute indication for Dialysis at this time, continuing to monitor closely     #Diabetes type 2 With CKD including proteinuria Outpatient patient was on lisinopril outpatient Currently held due to acute illness.       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    #Anemia of CKD Monitor hemoglobin closely Oral iron supplementation daily  # Acute respiratory failure Extubation successful, on 2L [place]    LOS: 6 [place] 11/4/20229:10 AM

## 2020-11-15 NOTE — Progress Notes (Signed)
Progress Note  Patient Name: Autumn [name] Date of Encounter: 11/15/2020  [facility] Cardiologist: new to [place] this morning, no acute events overnight Telemetry reviewed, remains in atrial fibrillation, rate trending upwards  Inpatient Medications    Scheduled Meds:  acetaminophen  650 mg Oral Once   allopurinol  200 mg Oral Daily   budesonide (PULMICORT) nebulizer solution  0.5 mg Nebulization BID   Chlorhexidine Gluconate Cloth  6 each Topical Daily   diltiazem  60 mg Per Tube Q6H   docusate  100 mg Per Tube BID   ferrous gluconate  324 mg Oral Q breakfast   insulin aspart  0-6 Units Subcutaneous Q4H   ipratropium-albuterol  3 mL Nebulization TID   levothyroxine  100 mcg Oral Q0600   mouth rinse  15 mL Mouth Rinse BID   metoprolol tartrate  25 mg Oral BID   multivitamin with minerals  1 tablet Oral Daily   pantoprazole (PROTONIX) IV  40 mg Intravenous Daily   polyethylene glycol  17 g Per Tube Daily   venlafaxine  100 mg Oral BID   Continuous Infusions:  sodium chloride Stopped (11/11/20 0850)   ampicillin-sulbactam (UNASYN) IV Stopped (11/15/20 1006)   diltiazem (CARDIZEM) infusion 15 mg/hr (11/15/20 1300)   sodium bicarbonate 150 mEq in D5W infusion 75 mL/hr at 11/15/20 1300   PRN Meds: sodium chloride, [DISCONTINUED] acetaminophen **OR** acetaminophen, acetaminophen, albuterol, diltiazem, meclizine, ondansetron **OR** ondansetron (ZOFRAN) IV   Vital Signs    Vitals:   11/15/20 1100 11/15/20 1200 11/15/20 1300 11/15/20 1316  BP: 122/81     Pulse: (!) 125 92 (!) 32 (!) 111  Resp: 18 16 15 16   Temp:  98 F (36.7 C)    TempSrc:  Axillary    SpO2: 97% 94% 98% 95%  Weight:      Height:        Intake/Output Summary (Last 24 hours) at 11/15/2020 1335 Last data filed at 11/15/2020 1300 Gross per 24 hour  Intake 1911.9 ml  Output 460 ml  Net 1451.9 ml   Last 3 Weights 11/09/2020 09/02/2018 03/30/2017  Weight (lbs) 190 lb 208 lb  215 lb  Weight (kg) 86.183 kg 94.348 kg 97.523 kg      Telemetry    Atrial fibrillation rate 120-130 beats per minute  Personally Reviewed  ECG     - Personally Reviewed  Physical Exam   GEN: Sleepy Neck: Unable to estimate JVD Cardiac: Irregularly irregular no murmurs, rubs, or gallops.  Respiratory: Clear to auscultation bilaterally. GI: Soft, nontender, non-distended  MS: No edema; No deformity. Neuro: Full exam not performed Psych: Sleepy  Labs    High Sensitivity Troponin:   Recent Labs  Lab 11/09/20 1220 11/09/20 1818 11/10/20 0115 11/10/20 0649  TROPONINIHS 147* 151* 162* 149*     Chemistry Recent Labs  Lab 11/09/20 1220 11/09/20 1818 11/10/20 0649 11/13/20 0203 11/14/20 0526 11/15/20 0942  NA 132*  --    < > 132* 133* 134*  K 3.2*  --    < > 4.1 4.5 3.9  CL 102  --    < > 104 104 102  CO2 18*  --    < > 18* 16* 19*  GLUCOSE 164*  --    < > 117* 183* 147*  BUN 69*  --    < > 87* 85* 106*  CREATININE 3.65*  --    < > 4.13* 4.91* 5.23*  CALCIUM 8.4*  --    < >  8.5* 8.9 8.3*  MG  --  2.0  --   --   --  2.4  PROT 6.4*  --   --   --   --   --   ALBUMIN 2.7*  --   --   --  2.2*  --   AST 35  --   --   --   --   --   ALT 25  --   --   --   --   --   ALKPHOS 84  --   --   --   --   --   BILITOT 0.4  --   --   --   --   --   GFRNONAA 13*  --    < > 11* 9* 8*  ANIONGAP 12  --    < > 10 13 13    < > = values in this interval not displayed.    Lipids  Recent Labs  Lab 11/10/20 1147 11/14/20 0526  CHOL 101  --   TRIG 149 213*  HDL 21*  --   LDLCALC 50  --   CHOLHDL 4.8  --     Hematology Recent Labs  Lab 11/13/20 0203 11/14/20 0526 11/15/20 0942  WBC 15.9* 14.5* 12.8*  RBC 3.13* 3.07* 2.97*  HGB 8.5* 8.5* 8.3*  HCT 26.7* 26.7* 26.1*  MCV 85.3 87.0 87.9  MCH 27.2 27.7 27.9  MCHC 31.8 31.8 31.8  RDW 15.7* 15.9* 16.1*  PLT 220 195 197   Thyroid  Recent Labs  Lab 11/09/20 1818  TSH 0.526    BNP Recent Labs  Lab 11/09/20 1220  BNP  360.1*    DDimer No results for input(s): DDIMER in the last 168 hours.   Radiology    MR BRAIN WO CONTRAST  Result Date: 11/13/2020 CLINICAL DATA:  Mental status changes of unknown cause. Recent fall at home. EXAM: MRI HEAD WITHOUT CONTRAST TECHNIQUE: Multiplanar, multiecho pulse sequences of the brain and surrounding structures were obtained without intravenous contrast. COMPARISON:  Head CT 11/13/2020 FINDINGS: Brain: Diffusion imaging shows an acute to subacute infarction affecting the left posterior frontal cortical and subcortical brain, potentially the precentral gyrus. No other acute finding. No sign of hemorrhage or mass effect associated with that. Otherwise, there chronic small-vessel ischemic changes of the pons. Cerebral hemispheres show moderate chronic small-vessel ischemic changes of the white matter. No mass lesion, hemorrhage, hydrocephalus or extra-axial collection. Few punctate foci of hemosiderin deposition noted in the pons and cerebral hemispheres. Vascular: Major vessels at the base of the brain show flow. 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 No mass effect or hemorrhage. Chronic small-vessel ischemic changes elsewhere affecting the brainstem and cerebral hemispheric white matter, some associated with chronic punctate hemosiderin deposition. Electronically Signed   By: Nelson Chimes M.D.   On: 11/13/2020 16:36   DG Chest Port 1 View  Result Date: 11/14/2020 CLINICAL DATA:  Acute respiratory failure EXAM: PORTABLE CHEST 1 VIEW COMPARISON:  Previous day FINDINGS: Endotracheal tube terminates approximately 1 cm above the carina. Esophageal temperature probe is present. There is enteric tube which terminates below the level of the diaphragm with the distal tip not visualized. The heart is  enlarged. Stable appearance of widened vascular pedicle. Left the perihilar and interstitial opacities are present bilaterally. There is increased consolidative opacity in  the right lower lung. Small right pleural effusion has increased in size. No acute osseous abnormality. IMPRESSION: 1. Support lines and tubes as described. The endotracheal tube appears to terminate approximately 1 cm above the carina. 2. Cardiomegaly with perihilar and interstitial opacities compatible with pulmonary edema. Small right pleural effusion has increased in size. 3. Evolving opacity in the right lung base is more conspicuous, and raises question for superimposed infection. Attention on follow-up films. Electronically Signed   By: Albin Felling M.D.   On: 11/14/2020 08:03   EEG adult  Result Date: 11/13/2020 Derek Jack, MD     11/13/2020  6:59 PM Routine EEG Report Autumn [name] is a 70 y.o. female with a history of encephalopathy who is undergoing an EEG to evaluate for seizures. Report: This EEG was acquired with electrodes placed according to the International 10-20 electrode system (including Fp1, Fp2, F3, F4, C3, C4, P3, P4, O1, O2, T3, T4, T5, T6, A1, A2, Fz, Cz, Pz). The following electrodes were missing or displaced: none. The best background was 5-6 Hz. This activity was near-continuous although occasionally 1-2 seconds of intervening diffuse suppression is noted. This activity is reactive to stimulation. Drowsiness was manifested by background fragmentation; deeper stages of sleep were identified by K complexes and sleep spindles. There was no focal slowing. There were no interictal epileptiform discharges. There were no electrographic seizures identified. Photic stimulation and hyperventilation were not performed. Impression and clinical correlation: This EEG was obtained while sedated and asleep and is abnormal due to moderate diffuse slowing and occasional brief periods of intervening suppression both  indicative of cerebral dysfunction, medication effect, or both. Su Monks, MD [facility] [phone] If 7pm- 7am, please page neurology on call as listed in [place].    Cardiac Studies  2D echo 11/10/2020: 1. Left ventricular ejection fraction, by estimation, is 55 to 60%. The  left ventricle has normal function. The left ventricle has no regional  wall motion abnormalities. There is mild concentric left ventricular  hypertrophy. Left ventricular diastolic  parameters are indeterminate. Elevated left ventricular end-diastolic  pressure.   2. Right ventricular systolic function is normal. The right ventricular  size is normal. There is normal pulmonary artery systolic pressure.   3. Left atrial size was severely dilated.   4. The mitral valve is normal in structure. Trivial mitral valve  regurgitation. No evidence of mitral stenosis.   5. The aortic valve is tricuspid. Aortic valve regurgitation is not  visualized. No aortic stenosis is present.   6. The inferior vena cava is normal in size with greater than 50%  respiratory variability, suggesting right atrial pressure of 3 mmHg.   7. There is a small patent foramen ovale.   Patient Profile     Autumn [name] is a 70 y.o. female with a hx of HFpEF, CKD stage IV-V, diabetes since her late 63s to early 37s with diabetic neuropathy and diabetic foot ulcer, HTN, anemia of chronic disease, hepatic cirrhosis, sleep apnea, and gout who is being seen today for the evaluation of Afib with RVR   Assessment & Plan   1.  Atrial fibrillation with RVR Chronicity unclear, presenting with rate 130 up to 140 Treated with diltiazem infusion transition to short acting diltiazem and Lopressor -CHADS VASC 6 Acute stroke this admission, heparin on hold per neurology -Would restart heparin when cleared by neurology -Unable to pass speech and swallow, medications held until NG tube can be placed -For now we will restart diltiazem  infusion for rate control   2.  Elevated troponin Demand ischemia in the setting of osteomyelitis, atrial fibrillation with RVR, chronic kidney disease, anemia of chronic disease, acute stroke -Not a good candidate for ischemic work-up   3.  Sepsis/osteomyelitis On broad-spectrum antibiotics, Zosyn Worsening left foot chronic diabetic ulcer with osteo-, cellulitis on MRI Has had amputations of toes, I&D of abscess -Podiatry following   4.  Acute stroke Rapid response several days ago transferred to ICU, intubated November 2 with evolving neurologic changes, unresponsive Code stroke, MRI confirming acute stroke Followed by neurology Left-sided deficits, heparin on hold Etiology possibly from atrial fibrillation.  Would restart anticoagulation when cleared by neurology   Total encounter time more than 25 minutes  Greater than 50% was spent in counseling and coordination of care with the patient    For questions or updates, please contact [place] Please consult [email or web address] for contact info under        Signed, Ida Rogue, MD  11/15/2020, 1:35 PM

## 2020-11-15 NOTE — Progress Notes (Signed)
Nutrition Follow-up  DOCUMENTATION CODES:   Obesity unspecified  INTERVENTION:   RD will monitor for diet advancement vs the need for nutrition support.   If NGT placed, recommend:  Vital 1.5 '@60ml' /hr- Initiate at 56m/hr and increase by 13mhr q 8 hours until goal rate is reached.   Free water flushes 3035m4 hours to maintain tube patency   Regimen provides 2160kcal/day, 97g/day protein and 1280m41m of free water   Pt at high refeed risk; recommend monitor potassium, magnesium and phosphorus labs daily until stable  Juven Fruit Punch BID via tube, each serving provides 95kcal and 2.5g of protein (amino acids glutamine and arginine)  NUTRITION DIAGNOSIS:   Increased nutrient needs related to wound healing as evidenced by estimated needs.  GOAL:   Patient will meet greater than or equal to 90% of their needs -not met   MONITOR:   Diet advancement, Labs, Weight trends, Skin, I & O's  ASSESSMENT:   [name] who presents to the ER for evaluation of frequent falls and is found to be septic from left foot osteomyelitis and also in rapid A. fib.  - Pt s/p MRI 11/2 which revealed an acute to early subacute infarction in the left posterior frontal cortical and subcortical brain, possibly affecting the precentral gyrus.  Pt emergently intubated 11/2 for airway protection. Pt extubated yesterday. Pt found to have new CVA. Pt lethargic today. SLP evaluation pending. If pt is unable to be initiated on a oral diet, plan is for NGT and nutrition support. Pt is at high refeed risk. No BM noted since 10/30; MD notified. No new weight since admit; will request daily weights. Pt +1.5L on her I & Os.   Medications reviewed and include: allopurinol, colace, insulin, synthroid, ferrous gluconate, MVI, juven, protonix, unasyn, Na bicarbonate   Labs reviewed: Na 134(L), BUN 106(H), creat 5.23(H), Mg 2.4 wnl Wbc- 12.8(H), Hgb 8.3(L), Hct 26.1(L) Cbgs- 142, 144 x 24  hrs  Diet Order:   Diet Order             Diet NPO time specified  Diet effective now                  EDUCATION NEEDS:   No education needs have been identified at this time  Skin:  Skin Assessment: Skin Integrity Issues: Skin Integrity Issues:: Diabetic Ulcer Diabetic Ulcer: lt foot  Last BM:  11/10/20  Height:   Ht Readings from Last 1 Encounters:  11/09/20 '5\' 6"'  (1.676 m)    Weight:   Wt Readings from Last 1 Encounters:  11/09/20 86.2 kg    Ideal Body Weight:  59.1 kg  BMI:  Body mass index is 30.67 kg/m.  Estimated Nutritional Needs:   Kcal:  1800-2100kcal/day  Protein:  90-105g/day  Fluid:  1.5-1.8L/day  CaseKoleen [name] RD, LDN Please refer to [name] RD and/or RD on-call/weekend/after hours pager

## 2020-11-16 DIAGNOSIS — N179 Acute kidney failure, unspecified: Secondary | ICD-10-CM | POA: Diagnosis not present

## 2020-11-16 DIAGNOSIS — R6520 Severe sepsis without septic shock: Secondary | ICD-10-CM | POA: Diagnosis not present

## 2020-11-16 DIAGNOSIS — A419 Sepsis, unspecified organism: Secondary | ICD-10-CM | POA: Diagnosis not present

## 2020-11-16 DIAGNOSIS — I4891 Unspecified atrial fibrillation: Secondary | ICD-10-CM | POA: Diagnosis not present

## 2020-11-16 LAB — CBC WITH DIFFERENTIAL/PLATELET
Abs Immature Granulocytes: 0.51 10*3/uL — ABNORMAL HIGH (ref 0.00–0.07)
Basophils Absolute: 0 10*3/uL (ref 0.0–0.1)
Basophils Relative: 0 %
Eosinophils Absolute: 0.1 10*3/uL (ref 0.0–0.5)
Eosinophils Relative: 1 %
HCT: 25.2 % — ABNORMAL LOW (ref 36.0–46.0)
Hemoglobin: 8 g/dL — ABNORMAL LOW (ref 12.0–15.0)
Immature Granulocytes: 5 %
Lymphocytes Relative: 9 %
Lymphs Abs: 0.9 10*3/uL (ref 0.7–4.0)
MCH: 27.4 pg (ref 26.0–34.0)
MCHC: 31.7 g/dL (ref 30.0–36.0)
MCV: 86.3 fL (ref 80.0–100.0)
Monocytes Absolute: 0.7 10*3/uL (ref 0.1–1.0)
Monocytes Relative: 6 %
Neutro Abs: 8.5 10*3/uL — ABNORMAL HIGH (ref 1.7–7.7)
Neutrophils Relative %: 79 %
Platelets: 168 10*3/uL (ref 150–400)
RBC: 2.92 MIL/uL — ABNORMAL LOW (ref 3.87–5.11)
RDW: 15.9 % — ABNORMAL HIGH (ref 11.5–15.5)
WBC: 10.8 10*3/uL — ABNORMAL HIGH (ref 4.0–10.5)
nRBC: 0.2 % (ref 0.0–0.2)

## 2020-11-16 LAB — COMPREHENSIVE METABOLIC PANEL
ALT: 18 U/L (ref 0–44)
AST: 19 U/L (ref 15–41)
Albumin: 2.1 g/dL — ABNORMAL LOW (ref 3.5–5.0)
Alkaline Phosphatase: 59 U/L (ref 38–126)
Anion gap: 13 (ref 5–15)
BUN: 99 mg/dL — ABNORMAL HIGH (ref 8–23)
CO2: 25 mmol/L (ref 22–32)
Calcium: 8.2 mg/dL — ABNORMAL LOW (ref 8.9–10.3)
Chloride: 101 mmol/L (ref 98–111)
Creatinine, Ser: 4.79 mg/dL — ABNORMAL HIGH (ref 0.44–1.00)
GFR, Estimated: 9 mL/min — ABNORMAL LOW (ref >60)
Glucose, Bld: 166 mg/dL — ABNORMAL HIGH (ref 70–99)
Potassium: 3.9 mmol/L (ref 3.5–5.1)
Sodium: 139 mmol/L (ref 135–145)
Total Bilirubin: 0.9 mg/dL (ref 0.3–1.2)
Total Protein: 5.6 g/dL — ABNORMAL LOW (ref 6.5–8.1)

## 2020-11-16 LAB — CULTURE, RESPIRATORY W GRAM STAIN: Gram Stain: NONE SEEN

## 2020-11-16 LAB — PHOSPHORUS: Phosphorus: 5.9 mg/dL — ABNORMAL HIGH (ref 2.5–4.6)

## 2020-11-16 LAB — VITAMIN B12: Vitamin B-12: 1090 pg/mL — ABNORMAL HIGH (ref 180–914)

## 2020-11-16 LAB — HEPARIN LEVEL (UNFRACTIONATED)
Heparin Unfractionated: 0.97 IU/mL — ABNORMAL HIGH (ref 0.30–0.70)
Heparin Unfractionated: >1.1 IU/mL — ABNORMAL HIGH (ref 0.30–0.70)
Heparin Unfractionated: 0.99 IU/mL — ABNORMAL HIGH (ref 0.30–0.70)

## 2020-11-16 LAB — GLUCOSE, CAPILLARY
Glucose-Capillary: 144 mg/dL — ABNORMAL HIGH (ref 70–99)
Glucose-Capillary: 150 mg/dL — ABNORMAL HIGH (ref 70–99)
Glucose-Capillary: 152 mg/dL — ABNORMAL HIGH (ref 70–99)
Glucose-Capillary: 157 mg/dL — ABNORMAL HIGH (ref 70–99)
Glucose-Capillary: 174 mg/dL — ABNORMAL HIGH (ref 70–99)
Glucose-Capillary: 207 mg/dL — ABNORMAL HIGH (ref 70–99)

## 2020-11-16 LAB — MAGNESIUM: Magnesium: 2.4 mg/dL (ref 1.7–2.4)

## 2020-11-16 LAB — FOLATE: Folate: 13.6 ng/mL (ref >5.9)

## 2020-11-16 MED ORDER — LACTATED RINGERS IV SOLN
INTRAVENOUS | Status: DC
Start: 2020-11-16 — End: 2020-11-20

## 2020-11-16 MED ORDER — HEPARIN (PORCINE) 25000 UT/250ML-% IV SOLN
1250.0000 [IU]/h | INTRAVENOUS | Status: DC
  Administered 2020-11-17: 1250 [IU]/h via INTRAVENOUS
  Filled 2020-11-16: qty 250

## 2020-11-16 NOTE — Consult Note (Signed)
ANTICOAGULATION CONSULT NOTE   Pharmacy Consult for Heparin gtt Indication: atrial fibrillation  Patient Measurements: Height: 5\' 6"  (167.6 cm) Weight: 91.7 kg (202 lb 2.6 oz) IBW/kg (Calculated) : 59.3 Heparin Dosing Weight: 77.8 kg  Labs: Recent Labs    11/13/20 1654 11/14/20 0526 11/14/20 0526 11/15/20 0942 11/16/20 0246 11/16/20 1153  HGB  --  8.5*   < > 8.3* 8.0*  --   HCT  --  26.7*  --  26.1* 25.2*  --   PLT  --  195  --  197 168  --   HEPARINUNFRC <0.10*  --   --   --  0.97* >1.10*  CREATININE  --  4.91*  --  5.23* 4.79*  --    < > = values in this interval not displayed.     Estimated Creatinine Clearance: 12.8 mL/min (A) (by C-G formula based on SCr of 4.79 mg/dL (H)).   Medical History: Past Medical History:  Diagnosis Date   Diabetes mellitus without complication ([place])    Hypertension    Kidney stone    Thyroid disease     Medications:  Scheduled:   acetaminophen  650 mg Oral Once   allopurinol  200 mg Oral Daily   vitamin C  500 mg Oral BID   budesonide (PULMICORT) nebulizer solution  0.5 mg Nebulization BID   Chlorhexidine Gluconate Cloth  6 each Topical Daily   diltiazem  60 mg Per Tube Q6H   docusate  100 mg Per Tube BID   feeding supplement (NEPRO CARB STEADY)  237 mL Oral TID BM   ferrous gluconate  324 mg Oral Q breakfast   free water  30 mL Per Tube Q4H   insulin aspart  0-6 Units Subcutaneous Q4H   ipratropium-albuterol  3 mL Nebulization TID   levothyroxine  100 mcg Oral Q0600   mouth rinse  15 mL Mouth Rinse BID   metoprolol tartrate  25 mg Oral BID   multivitamin with minerals  1 tablet Oral Daily   nutrition supplement (JUVEN)  1 packet Per Tube BID BM   pantoprazole (PROTONIX) IV  40 mg Intravenous Daily   polyethylene glycol  17 g Per Tube Daily   venlafaxine  100 mg Oral BID    Heparin Dosing Weight: 77.8 kg  Assessment: Patient admitted with new onset A/Fib (CHADS-VASc - 6) and worsening renal function.  Antibiotics for  osteomyelitis were initiated (s/p toe amputation & I/D of abscess) but post-op c/b unresponsiveness and neurochanges on 11/2 transferred to ICU.  Pt noted to have Patent Foramen Ovale and Afib, but anticoagulation initially held for further w/u of neurologic deficits.    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 There was no mass effect or hemorrhage and pharmacy consulted for heparin drip mgmt to resume once clear by Neurology on 11/4.  Noted history of DM, CKD-stage 4,HTN, thyroid disorder, and liver cirrhosis secondary to NASH  Date Time aPTT/HL Rate/Comment 10/30  0115 0.10  Subtherapeutic  10/30   2051 0.18  Subtherapeutic increase drip rate to 1800 units/hr 10/31  0704 0.43  Therapeutic x1; 1800 un/hr 11/02 0855 0.64  Therapeutic x2; 1800 un/hr 11/05 0246 0.97  Supratherapeutic; 1800 un/hr 11/05 1153 >1.1  Supratherapeutic; 1650 un/hr  Goal of Therapy:  Heparin level 0.3-0.7 units/ml Monitor platelets by anticoagulation protocol: Yes   Plan:  Hold heparin infusion x 1 hour and  then decrease heparin infusion rate to 1400 units/hr Recheck heparin level in 8 hr after rate change CBC daily while on heparin  Autumn [name]  11/16/2020 12:44 PM

## 2020-11-16 NOTE — Progress Notes (Signed)
Progress Note  Patient Name: Autumn [name] Date of Encounter: 11/16/2020  [facility] Cardiologist: Dr. Saunders Revel  Subjective   No acute events overnight, NG tube in place, heparin drip restarted.  Inpatient Medications    Scheduled Meds:  acetaminophen  650 mg Oral Once   allopurinol  200 mg Oral Daily   vitamin C  500 mg Oral BID   budesonide (PULMICORT) nebulizer solution  0.5 mg Nebulization BID   Chlorhexidine Gluconate Cloth  6 each Topical Daily   diltiazem  60 mg Per Tube Q6H   docusate  100 mg Per Tube BID   feeding supplement (NEPRO CARB STEADY)  237 mL Oral TID BM   ferrous gluconate  324 mg Oral Q breakfast   free water  30 mL Per Tube Q4H   insulin aspart  0-6 Units Subcutaneous Q4H   ipratropium-albuterol  3 mL Nebulization TID   levothyroxine  100 mcg Oral Q0600   mouth rinse  15 mL Mouth Rinse BID   metoprolol tartrate  25 mg Oral BID   multivitamin with minerals  1 tablet Oral Daily   nutrition supplement (JUVEN)  1 packet Per Tube BID BM   pantoprazole (PROTONIX) IV  40 mg Intravenous Daily   polyethylene glycol  17 g Per Tube Daily   venlafaxine  100 mg Oral BID   Continuous Infusions:  sodium chloride Stopped (11/11/20 0850)   ampicillin-sulbactam (UNASYN) IV Stopped (11/16/20 0936)   diltiazem (CARDIZEM) infusion Stopped (11/15/20 1726)   feeding supplement (VITAL 1.5 CAL) 30 mL/hr at 11/15/20 2230   heparin     lactated ringers 75 mL/hr at 11/16/20 1039   PRN Meds: sodium chloride, [DISCONTINUED] acetaminophen **OR** acetaminophen, acetaminophen, albuterol, diltiazem, meclizine, ondansetron **OR** ondansetron (ZOFRAN) IV   Vital Signs    Vitals:   11/16/20 0700 11/16/20 0800 11/16/20 0900 11/16/20 1000  BP: 120/73 134/82 123/79 114/72  Pulse: (!) 102 (!) 101 72 (!) 151  Resp: 17 20 18 17   Temp:  97.9 F (36.6 C)    TempSrc:  Axillary    SpO2: 90% 97% 94% 92%  Weight:      Height:        Intake/Output Summary (Last 24 hours) at  11/16/2020 1439 Last data filed at 11/16/2020 1021 Gross per 24 hour  Intake 2059.7 ml  Output 1275 ml  Net 784.7 ml   Last 3 Weights 11/16/2020 11/09/2020 09/02/2018  Weight (lbs) 202 lb 2.6 oz 190 lb 208 lb  Weight (kg) 91.7 kg 86.183 kg 94.348 kg      Telemetry    Atrial fibrillation, heart rate 118- Personally Reviewed  ECG     - Personally Reviewed  Physical Exam   GEN: Somnolent, soft-spoken Neck: No JVD, NG tube noted Cardiac: Irregular irregular Respiratory: Diminished breath sounds at bases GI: Soft, nontender, non-distended  MS: No edema; left foot in dressing Neuro: Unable to assess, mittens in left arm noted Psych: Unable to assess  Labs    High Sensitivity Troponin:   Recent Labs  Lab 11/09/20 1220 11/09/20 1818 11/10/20 0115 11/10/20 0649  TROPONINIHS 147* 151* 162* 149*     Chemistry Recent Labs  Lab 11/09/20 1818 11/10/20 0649 11/14/20 0526 11/15/20 0942 11/16/20 0246  NA  --    < > 133* 134* 139  K  --    < > 4.5 3.9 3.9  CL  --    < > 104 102 101  CO2  --    < >  16* 19* 25  GLUCOSE  --    < > 183* 147* 166*  BUN  --    < > 85* 106* 99*  CREATININE  --    < > 4.91* 5.23* 4.79*  CALCIUM  --    < > 8.9 8.3* 8.2*  MG 2.0  --   --  2.4 2.4  PROT  --   --   --   --  5.6*  ALBUMIN  --   --  2.2*  --  2.1*  AST  --   --   --   --  19  ALT  --   --   --   --  18  ALKPHOS  --   --   --   --  59  BILITOT  --   --   --   --  0.9  GFRNONAA  --    < > 9* 8* 9*  ANIONGAP  --    < > 13 13 13    < > = values in this interval not displayed.    Lipids  Recent Labs  Lab 11/10/20 1147 11/14/20 0526  CHOL 101  --   TRIG 149 213*  HDL 21*  --   LDLCALC 50  --   CHOLHDL 4.8  --     Hematology Recent Labs  Lab 11/14/20 0526 11/15/20 0942 11/16/20 0246  WBC 14.5* 12.8* 10.8*  RBC 3.07* 2.97* 2.92*  HGB 8.5* 8.3* 8.0*  HCT 26.7* 26.1* 25.2*  MCV 87.0 87.9 86.3  MCH 27.7 27.9 27.4  MCHC 31.8 31.8 31.7  RDW 15.9* 16.1* 15.9*  PLT 195 197  168   Thyroid  Recent Labs  Lab 11/09/20 1818  TSH 0.526    BNPNo results for input(s): BNP, PROBNP in the last 168 hours.  DDimer No results for input(s): DDIMER in the last 168 hours.   Radiology    DG Abd 1 View  Result Date: 11/15/2020 CLINICAL DATA:  Evaluate feeding catheter placement EXAM: ABDOMEN - 1 VIEW COMPARISON:  Film from earlier in the same day. FINDINGS: Catheter is been manipulated and now lies in the mid to distal stomach directed towards the pylorus. IMPRESSION: Gastric catheter within the mid to distal stomach directed towards the pylorus. Electronically Signed   By: Inez Catalina M.D.   On: 11/15/2020 22:23   DG Abd 1 View  Result Date: 11/15/2020 CLINICAL DATA:  Evaluate feeding catheter EXAM: ABDOMEN - 1 VIEW COMPARISON:  None. FINDINGS: Scattered large and small bowel gas is noted. Weighted feeding catheter is noted coiled in the mid stomach. No free air is seen. IMPRESSION: Feeding catheter in the mid stomach. Electronically Signed   By: Inez Catalina M.D.   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 EXAM: PORTABLE CHEST 1 VIEW COMPARISON:  11/14/2020 and older studies. FINDINGS: Since the prior exam, the endotracheal tube and the nasal/orogastric tube have been removed. Cardiac silhouette is normal in size. No mediastinal or hilar masses. Vascular congestion has improved. Lung base opacities have cleared. Lungs are now clear. No pneumothorax.  Possible small effusions. Skeletal structures are grossly intact. IMPRESSION: 1. Significant improvement. There is residual vascular congestion, but no persistent interstitial or airspace edema. 2. Status post removal of the endotracheal tube and nasal/orogastric  tube. Electronically Signed   By: Lajean Manes M.D.   On: 11/15/2020  15:58    Cardiac Studies   Echo 10/2020 1. Left ventricular ejection fraction, by estimation, is 55 to 60%. The  left ventricle has normal function. The left ventricle has no regional  wall motion abnormalities. There is mild concentric left ventricular  hypertrophy. Left ventricular diastolic  parameters are indeterminate. Elevated left ventricular end-diastolic  pressure.   2. Right ventricular systolic function is normal. The right ventricular  size is normal. There is normal pulmonary artery systolic pressure.   3. Left atrial size was severely dilated.   4. The mitral valve is normal in structure. Trivial mitral valve  regurgitation. No evidence of mitral stenosis.   5. The aortic valve is tricuspid. Aortic valve regurgitation is not  visualized. No aortic stenosis is present.   6. The inferior vena cava is normal in size with greater than 50%  respiratory variability, suggesting right atrial pressure of 3 mmHg.   7. There is a small patent foramen ovale.   Patient Profile     70 y.o. female with history of HFpEF, CKD 4, cirrhosis, smoker x50+ years, admitted with left foot osteomyelitis, being seen for atrial fibrillation with rapid ventricular response.  Hospital course complicated by respiratory failure and acute stroke.  Assessment & Plan   1.  A. fib RVR -Heart rate reasonable -Continue Cardizem 60 mg every 6, Lopressor 25 mg twice daily -Heparin drip restarted -Consolidate Toprol-XL upon discharge, start oral anticoagulation when deemed appropriate as per neurology. -Echo with preserved EF  2.  Acute stroke -Possibly embolic due to A. Fib -Heparin drip restarted -Recommend NOAC upon discharge -Continue PT OT.   3.  Left foot osteomyelitis -Antibiotics as per primary team   Greater than 50% was spent in counseling and coordination of care with patient Total encounter time 35 minutes      Signed, Kate Sable, MD  11/16/2020, 2:39 PM

## 2020-11-16 NOTE — Consult Note (Signed)
[place] for Heparin gtt Indication: atrial fibrillation  Allergies  Allergen Reactions   Codeine Nausea And Vomiting    Patient Measurements: Height: 5\' 6"  (167.6 cm) Weight: 91.7 kg (202 lb 2.6 oz) IBW/kg (Calculated) : 59.3 Heparin Dosing Weight: 77.8 kg  Vital Signs: Temp: 97.6 F (36.4 C) (11/05 0200) Temp Source: Axillary (11/05 0200) BP: 123/65 (11/05 0300) Pulse Rate: 85 (11/05 0300)  Labs: Recent Labs    11/13/20 0855 11/13/20 1654 11/14/20 0526 11/14/20 0526 11/15/20 0942 11/16/20 0246  HGB  --   --  8.5*   < > 8.3* 8.0*  HCT  --   --  26.7*  --  26.1* 25.2*  PLT  --   --  195  --  197 168  HEPARINUNFRC 0.64 <0.10*  --   --   --  0.97*  CREATININE  --   --  4.91*  --  5.23* 4.79*   < > = values in this interval not displayed.     Estimated Creatinine Clearance: 12.8 mL/min (A) (by C-G formula based on SCr of 4.79 mg/dL (H)).   Medical History: Past Medical History:  Diagnosis Date   Diabetes mellitus without complication ([place])    Hypertension    Kidney stone    Thyroid disease     Medications:  Scheduled:   acetaminophen  650 mg Oral Once   allopurinol  200 mg Oral Daily   vitamin C  500 mg Oral BID   budesonide (PULMICORT) nebulizer solution  0.5 mg Nebulization BID   Chlorhexidine Gluconate Cloth  6 each Topical Daily   diltiazem  60 mg Per Tube Q6H   docusate  100 mg Per Tube BID   feeding supplement (NEPRO CARB STEADY)  237 mL Oral TID BM   ferrous gluconate  324 mg Oral Q breakfast   free water  30 mL Per Tube Q4H   insulin aspart  0-6 Units Subcutaneous Q4H   ipratropium-albuterol  3 mL Nebulization TID   levothyroxine  100 mcg Oral Q0600   mouth rinse  15 mL Mouth Rinse BID   metoprolol tartrate  25 mg Oral BID   multivitamin with minerals  1 tablet Oral Daily   nutrition supplement (JUVEN)  1 packet Per Tube BID BM   pantoprazole (PROTONIX) IV  40 mg Intravenous Daily   polyethylene  glycol  17 g Per Tube Daily   venlafaxine  100 mg Oral BID    Heparin Dosing Weight: 77.8 kg  Assessment: Patient admitted with new onset A/Fib (CHADS-VASc - 6) and worsening renal function.  Antibiotics for osteomyelitis were initiated (s/p toe amputation & I/D of abscess) but post-op c/b unresponsiveness and neurochanges on 11/2 transferred to ICU.  Pt noted to have Patent Foramen Ovale and Afib, but anticoagulation initially held for further w/u of neurologic deficits.    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 There was no mass effect or hemorrhage and pharmacy consulted for heparin drip mgmt to resume once clear by Neurology on 11/4.  Noted history of DM, CKD-stage 4,HTN, thyroid disorder, and liver cirrhosis secondary to NASH  Date Time aPTT/HL Rate/Comment 10/30  0115 0.10  subtherapeutic  10/30   2051 0.18  subtherapeutic increase drip rate to 1800 units/hr 10/31  0704 0.43  Therapeutic x1; 1800 un/hr 11/02 0855 0.64  Therapeutic x2; 1800 un/hr 11/05 0246 0.97  Supratherapeutic x 1  Goal of Therapy:  Heparin level 0.3-0.7 units/ml Monitor platelets by anticoagulation protocol: Yes   Plan:  Decrease heparin infusion rate to 1650 units/hr.  Recheck heparin level in 8 hr after rate change.  CBC daily while on heparin.    Renda Rolls, PharmD, [facility] 11/16/2020 4:05 AM

## 2020-11-16 NOTE — Progress Notes (Signed)
[facility]  ROUNDING NOTE   Subjective:   Patient seen in the ICU Patient offers no new specific physical complaints Patient does have a history of expressive aphasia   Objective:  Vital signs in last 24 hours:  Temp:  [97.6 F (36.4 C)-98.7 F (37.1 C)] 97.6 F (36.4 C) (11/05 0200) Pulse Rate:  [32-146] 94 (11/05 0600) Resp:  [14-18] 17 (11/05 0600) BP: (113-141)/(65-97) 126/72 (11/05 0600) SpO2:  [92 %-98 %] 94 % (11/05 0600) Weight:  [91.7 kg] 91.7 kg (11/05 0330)  Weight change:  Filed Weights   11/09/20 1214 11/16/20 0330  Weight: 86.2 kg 91.7 kg    Intake/Output: I/O last 3 completed shifts: In: 3619.2 [P.O.:120; I.V.:2934.2; NG/GT:265; IV Piggyback:300] Out: 1625 [Urine:1625]   Intake/Output this shift:  No intake/output data recorded.  Physical Exam: General: NAD  Head: Atraumatic. Moist oral mucosal membranes  Eyes: Anicteric,   Lungs:  Basilar crackles, normal effort, [place] O2  Heart: Irregular rhythm  Abdomen:  Soft, nontender  Extremities:  no peripheral edema.  Surgical dressing on left foot  Neurologic: Alert and able to follow commands   Skin: Warm        Basic Metabolic Panel: Recent Labs  Lab 11/09/20 1818 11/10/20 0649 11/12/20 0540 11/13/20 0203 11/14/20 0526 11/15/20 0942 11/16/20 0246  NA  --    < > 133* 132* 133* 134* 139  K  --    < > 4.5 4.1 4.5 3.9 3.9  CL  --    < > 105 104 104 102 101  CO2  --    < > 14* 18* 16* 19* 25  GLUCOSE  --    < > 131* 117* 183* 147* 166*  BUN  --    < > 76* 87* 85* 106* 99*  CREATININE  --    < > 3.88* 4.13* 4.91* 5.23* 4.79*  CALCIUM  --    < > 8.7* 8.5* 8.9 8.3* 8.2*  MG 2.0  --   --   --   --  2.4 2.4  PHOS  --   --   --   --  7.8*  --  5.9*   < > = values in this interval not displayed.    Liver Function Tests: Recent Labs  Lab 11/09/20 1220 11/14/20 0526 11/16/20 0246  AST 35  --  19  ALT 25  --  18  ALKPHOS 84  --  59  BILITOT 0.4  --  0.9  PROT 6.4*  --  5.6*   ALBUMIN 2.7* 2.2* 2.1*   No results for input(s): LIPASE, AMYLASE in the last 168 hours. No results for input(s): AMMONIA in the last 168 hours.  CBC: Recent Labs  Lab 11/09/20 1220 11/10/20 0649 11/12/20 0540 11/13/20 0203 11/14/20 0526 11/15/20 0942 11/16/20 0246  WBC 15.8*   < > 15.9* 15.9* 14.5* 12.8* 10.8*  NEUTROABS 13.5*  --   --   --   --   --  8.5*  HGB 8.0*   < > 7.0* 8.5* 8.5* 8.3* 8.0*  HCT 24.5*   < > 21.9* 26.7* 26.7* 26.1* 25.2*  MCV 86.3   < > 87.6 85.3 87.0 87.9 86.3  PLT 190   < > 189 220 195 197 168   < > = values in this interval not displayed.    Cardiac Enzymes: Recent Labs  Lab 11/09/20 1818  CKTOTAL 184    BNP: Invalid input(s): POCBNP  CBG: Recent Labs  Lab 11/15/20 1552 11/15/20 1937 11/15/20 2314 11/16/20 0347 11/16/20 0758  GLUCAP 157* 166* 143* 150* 144*    Microbiology: Results for orders placed or performed during the hospital encounter of 11/09/20  Resp Panel by RT-PCR (Flu A&B, Covid) Nasopharyngeal Swab     Status: None   Collection Time: 11/09/20  6:25 PM   Specimen: Nasopharyngeal Swab; Nasopharyngeal(NP) swabs in vial transport medium  Result Value Ref Range Status   SARS Coronavirus 2 by RT PCR NEGATIVE NEGATIVE Final    Comment: (NOTE) SARS-CoV-2 target nucleic acids are NOT DETECTED.  The SARS-CoV-2 RNA is generally detectable in upper respiratory specimens during the acute phase of infection. The lowest concentration of SARS-CoV-2 viral copies this assay can detect is 138 copies/mL. A negative result does not preclude SARS-Cov-2 infection and should not be used as the sole basis for treatment or other patient management decisions. A negative result may occur with  improper specimen collection/handling, submission of specimen other than nasopharyngeal swab, presence of viral mutation(s) within the areas targeted by this assay, and inadequate number of viral copies(<138 copies/mL). A negative result must be  combined with clinical observations, patient history, and epidemiological information. The expected result is Negative.  Fact Sheet for Patients:  EntrepreneurPulse.com.au  Fact Sheet for Healthcare Providers:  IncredibleEmployment.be  This test is no t yet approved or cleared by the [place] FDA and  has been authorized for detection and/or diagnosis of SARS-CoV-2 by FDA under an Emergency Use Authorization (EUA). This EUA will remain  in effect (meaning this test can be used) for the duration of the COVID-19 declaration under Section 564(b)(1) of the Act, 21 U.S.C.section 360bbb-3(b)(1), unless the authorization is terminated  or revoked sooner.       Influenza A by PCR NEGATIVE NEGATIVE Final   Influenza B by PCR NEGATIVE NEGATIVE Final    Comment: (NOTE) The Xpert Xpress SARS-CoV-2/FLU/RSV plus assay is intended as an aid in the diagnosis of influenza from Nasopharyngeal swab specimens and should not be used as a sole basis for treatment. Nasal washings and aspirates are unacceptable for Xpert Xpress SARS-CoV-2/FLU/RSV testing.  Fact Sheet for Patients: EntrepreneurPulse.com.au  Fact Sheet for Healthcare Providers: IncredibleEmployment.be  This test is not yet approved or cleared by the [place] FDA and has been authorized for detection and/or diagnosis of SARS-CoV-2 by FDA under an Emergency Use Authorization (EUA). This EUA will remain in effect (meaning this test can be used) for the duration of the COVID-19 declaration under Section 564(b)(1) of the Act, 21 U.S.C. section 360bbb-3(b)(1), unless the authorization is terminated or revoked.  Performed at [facility], [address] [place], [place] [address]   MRSA Next Gen by PCR, Nasal     Status: None   Collection Time: 11/10/20  1:14 AM   Specimen: Nasal Mucosa; Nasal Swab  Result Value Ref Range Status   MRSA by PCR  Next Gen NOT DETECTED NOT DETECTED Final    Comment: (NOTE) The GeneXpert MRSA Assay (FDA approved for NASAL specimens only), is one component of a comprehensive MRSA colonization surveillance program. It is not intended to diagnose MRSA infection nor to guide or monitor treatment for MRSA infections. Test performance is not FDA approved in patients less than 2 years old. Performed at [facility], [address] [place],  [address]   Aerobic/Anaerobic Culture w Gram Stain (surgical/deep wound)     Status: None (Preliminary result)   Collection Time: 11/11/20  3:23 PM  Specimen: Foot, Left; Abscess  Result Value Ref Range Status   Specimen Description   Final    WOUND LEFT FOOT ABSCESS Performed at [facility], [address] [place], [place] [address]    Special Requests   Final    NONE Performed at [facility], [address] [place], [place] [address]    Gram Stain   Final    NO ORGANISMS SEEN SQUAMOUS EPITHELIAL CELLS PRESENT MODERATE WBC PRESENT, PREDOMINANTLY MONONUCLEAR MODERATE GRAM POSITIVE COCCI    Culture   Final    RARE STAPHYLOCOCCUS AUREUS RARE PASTEURELLA MULTOCIDA Usually susceptible to penicillin and other beta lactam agents,quinolones,macrolides and tetracyclines. HOLDING FOR POSSIBLE ANAEROBE Performed at [facility], [place] [address] [place], [place] [address]    Report Status PENDING  Incomplete   Organism ID, Bacteria STAPHYLOCOCCUS AUREUS  Final      Susceptibility   Staphylococcus aureus - MIC*    CIPROFLOXACIN <=0.5 SENSITIVE Sensitive     ERYTHROMYCIN <=0.25 SENSITIVE Sensitive     GENTAMICIN <=0.5 SENSITIVE Sensitive     OXACILLIN <=0.25 SENSITIVE Sensitive     TETRACYCLINE <=1 SENSITIVE Sensitive     VANCOMYCIN <=0.5 SENSITIVE Sensitive     TRIMETH/SULFA <=10 SENSITIVE Sensitive     CLINDAMYCIN <=0.25 SENSITIVE Sensitive     RIFAMPIN <=0.5 SENSITIVE Sensitive     Inducible Clindamycin NEGATIVE  Sensitive     * RARE STAPHYLOCOCCUS AUREUS  Aerobic/Anaerobic Culture w Gram Stain (surgical/deep wound)     Status: None (Preliminary result)   Collection Time: 11/11/20  3:32 PM   Specimen: Other Source; Tissue  Result Value Ref Range Status   Specimen Description   Final    WOUND 2ND METATARSAL Performed at [facility], [address] [place], [place] [address]    Special Requests   Final    NONE Performed at [facility], [address] [place], [place] [address]    Gram Stain   Final    NO ORGANISMS SEEN SQUAMOUS EPITHELIAL CELLS PRESENT FEW WBC PRESENT, PREDOMINANTLY MONONUCLEAR FEW GRAM POSITIVE COCCI    Culture   Final    RARE STREPTOCOCCUS MITIS/ORALIS RARE PASTEURELLA MULTOCIDA Usually susceptible to penicillin and other beta lactam agents,quinolones,macrolides and tetracyclines. ABUNDANT BACTEROIDES SPECIES BETA LACTAMASE POSITIVE Performed at [facility], [place] [address] [place], [place] [address]    Report Status PENDING  Incomplete   Organism ID, Bacteria STREPTOCOCCUS MITIS/ORALIS  Final      Susceptibility   Streptococcus mitis/oralis - MIC*    TETRACYCLINE 0.5 SENSITIVE Sensitive     VANCOMYCIN 0.5 SENSITIVE Sensitive     CLINDAMYCIN <=0.25 SENSITIVE Sensitive     * RARE STREPTOCOCCUS MITIS/ORALIS  Aerobic/Anaerobic Culture w Gram Stain (surgical/deep wound)     Status: None (Preliminary result)   Collection Time: 11/11/20  4:26 PM   Specimen: Other Source; Tissue  Result Value Ref Range Status   Specimen Description   Final    WOUND LEFT METATARSAL Performed at [facility], [address] [place], [place] [address]    Special Requests   Final    NONE Performed at [facility], [place], [place] [address]    Gram Stain NO WBC SEEN NO ORGANISMS SEEN   Final   Culture   Final    NO GROWTH 4 DAYS NO ANAEROBES ISOLATED; CULTURE IN PROGRESS FOR 5 DAYS Performed at [place] [address] [place], [place]  [address]    Report Status PENDING  Incomplete  MRSA Next Gen by PCR, Nasal     Status: None   Collection Time: 11/13/20 11:22 AM   Specimen: Nasal Mucosa; Nasal Swab  Result Value Ref Range Status   MRSA by PCR Next Gen NOT DETECTED NOT DETECTED Final    Comment: (NOTE) The GeneXpert MRSA Assay (FDA approved for NASAL specimens only), is one component of a comprehensive MRSA colonization surveillance program. It is not intended to diagnose MRSA infection nor to guide or monitor treatment for MRSA infections. Test performance is not FDA approved in patients less than 64 years old. Performed at [facility], [address] [place], [place] [address]   CULTURE, BLOOD (ROUTINE X 2) w Reflex to ID Panel     Status: None (Preliminary result)   Collection Time: 11/13/20 12:44 PM   Specimen: BLOOD  Result Value Ref Range Status   Specimen Description BLOOD LEFT ANTECUBITAL  Final   Special Requests   Final    BOTTLES DRAWN AEROBIC AND ANAEROBIC Blood Culture adequate volume   Culture   Final    NO GROWTH 3 DAYS Performed at [facility], [address] [place], [place] [address]    Report Status PENDING  Incomplete  CULTURE, BLOOD (ROUTINE X 2) w Reflex to ID Panel     Status: None (Preliminary result)   Collection Time: 11/13/20 12:52 PM   Specimen: BLOOD  Result Value Ref Range Status   Specimen Description BLOOD BLOOD LEFT HAND  Final   Special Requests   Final    BOTTLES DRAWN AEROBIC ONLY Blood Culture results may not be optimal due to an inadequate volume of blood received in culture bottles   Culture   Final    NO GROWTH 3 DAYS Performed at [facility], [address], [place], [place] [address]    Report Status PENDING  Incomplete  Culture, Respiratory w Gram Stain     Status: None (Preliminary result)   Collection Time: 11/13/20  3:43 PM   Specimen: Tracheal Aspirate; Respiratory  Result Value Ref Range  Status   Specimen Description   Final    TRACHEAL ASPIRATE Performed at [facility], [address], [place], [place] [address]    Special Requests   Final    NONE Performed at [facility], [address] [place], [place] [address]    Gram Stain   Final    NO SQUAMOUS EPITHELIAL CELLS SEEN FEW WBC SEEN NO ORGANISMS SEEN    Culture   Final    RARE YEAST CULTURE REINCUBATED FOR BETTER GROWTH Performed at [facility], [place] [address] [place], [place] [address]    Report Status PENDING  Incomplete    Coagulation Studies: No results for input(s): LABPROT, INR in the last 72 hours.   Urinalysis: No results for input(s): COLORURINE, LABSPEC, PHURINE, GLUCOSEU, HGBUR, BILIRUBINUR, KETONESUR, PROTEINUR, UROBILINOGEN, NITRITE, LEUKOCYTESUR in the last 72 hours.  Invalid input(s): APPERANCEUR     Imaging: DG Abd 1 View  Result Date: 11/15/2020 CLINICAL DATA:  Evaluate feeding catheter placement EXAM: ABDOMEN - 1 VIEW COMPARISON:  Film from earlier in the same day. FINDINGS: Catheter is been manipulated and now lies in the mid to distal stomach directed towards the pylorus. IMPRESSION: Gastric catheter within the mid to distal stomach directed towards the pylorus. Electronically Signed   By: Inez Catalina M.D.   On: 11/15/2020 22:23   DG Abd 1  View  Result Date: 11/15/2020 CLINICAL DATA:  Evaluate feeding catheter EXAM: ABDOMEN - 1 VIEW COMPARISON:  None. FINDINGS: Scattered large and small bowel gas is noted. Weighted feeding catheter is noted coiled in the mid stomach. No free air is seen. IMPRESSION: Feeding catheter in the mid stomach. Electronically Signed   By: Inez Catalina M.D.   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 EXAM: PORTABLE CHEST 1 VIEW  COMPARISON:  11/14/2020 and older studies. FINDINGS: Since the prior exam, the endotracheal tube and the nasal/orogastric tube have been removed. Cardiac silhouette is normal in size. No mediastinal or hilar masses. Vascular congestion has improved. Lung base opacities have cleared. Lungs are now clear. No pneumothorax.  Possible small effusions. Skeletal structures are grossly intact. IMPRESSION: 1. Significant improvement. There is residual vascular congestion, but no persistent interstitial or airspace edema. 2. Status post removal of the endotracheal tube and nasal/orogastric tube. Electronically Signed   By: Lajean Manes M.D.   On: 11/15/2020 15:58     Medications:    sodium chloride Stopped (11/11/20 0850)   ampicillin-sulbactam (UNASYN) IV 3 g (11/16/20 0906)   diltiazem (CARDIZEM) infusion Stopped (11/15/20 1726)   feeding supplement (VITAL 1.5 CAL) 30 mL/hr at 11/15/20 2230   heparin 1,650 Units/hr (11/16/20 1004)   lactated ringers      acetaminophen  650 mg Oral Once   allopurinol  200 mg Oral Daily   vitamin C  500 mg Oral BID   budesonide (PULMICORT) nebulizer solution  0.5 mg Nebulization BID   Chlorhexidine Gluconate Cloth  6 each Topical Daily   diltiazem  60 mg Per Tube Q6H   docusate  100 mg Per Tube BID   feeding supplement (NEPRO CARB STEADY)  237 mL Oral TID BM   ferrous gluconate  324 mg Oral Q breakfast   free water  30 mL Per Tube Q4H   insulin aspart  0-6 Units Subcutaneous Q4H   ipratropium-albuterol  3 mL Nebulization TID   levothyroxine  100 mcg Oral Q0600   mouth rinse  15 mL Mouth Rinse BID   metoprolol tartrate  25 mg Oral BID   multivitamin with minerals  1 tablet Oral Daily   nutrition supplement (JUVEN)  1 packet Per Tube BID BM   pantoprazole (PROTONIX) IV  40 mg Intravenous Daily   polyethylene glycol  17 g Per Tube Daily   venlafaxine  100 mg Oral BID   sodium chloride, [DISCONTINUED] acetaminophen **OR** acetaminophen, acetaminophen, albuterol,  diltiazem, meclizine, ondansetron **OR** ondansetron (ZOFRAN) IV  Assessment/ Plan:  Ms. Autumn [name] is a 71 y.o.  female with medical problems of   DM, HTN   was admitted on 11/09/2020 fof Atrial fibrillation with rapid ventricular response ([place]) [I48.91] AKI (acute kidney injury) ([place]) [N17.9] Osteomyelitis of foot, left, acute ([place]) [G31.517] Atrial fibrillation with RVR ([place]) [I48.91] Worsening renal function [N28.9] Syncope, unspecified syncope type [R55]     Impression   1)Renal   Acute kidney injury Patient has AKI secondary to ATN Patient has AKI secondary to hypotension/patient did have ACE on board as an outpatient Patient has AKI on CKD. Patient has CKD stage IV Patient has CKD secondary to diabetes mellitus Patient has CKD going back to 2019 Is currently nonoliguric Patient yesterday had a  urine output around 1.2 L Patient AKI is minimally better Patient creatinine has come down from 5.2 to -4.8. No acute need for renal replacement therapy today    2)HTN    Blood pressure is stable    3)Anemia of chronic disease  CBC Latest Ref Rng & Units 11/16/2020 11/15/2020 11/14/2020  WBC 4.0 - 10.5 K/uL 10.8(H) 12.8(H) 14.5(H)  Hemoglobin 12.0 - 15.0 g/dL 8.0(L) 8.3(L) 8.5(L)  Hematocrit 36.0 - 46.0 % 25.2(L) 26.1(L) 26.7(L)  Platelets 150 - 400 K/uL 168 197 195     Hemoglobin is stable No need for PRBC today   4) Hyperphosphatemia Patient has renal failure associated hyperphosphatemia We will hold off on starting binders    Lab Results  Component Value Date   CALCIUM 8.2 (L) 11/16/2020   PHOS 5.9 (H) 11/16/2020      5)Osteomyelitis Patient admitted with osteomyelitis of left foot 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 Neurology suspects periprocedural versus atrial fibrillation associated with CVA   9)Acute respiratory failure Patient is currently stable Patient was extubated yesterday   Plan   Patient AKI is improving slowly No need for renal replacement therapy Will follow up    LOS: 7 Cyril Woodmansee s [facility] 11/5/202210:12 AM

## 2020-11-16 NOTE — Progress Notes (Signed)
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 Other comorbidities include sepsis without shock secondary to osteomyelitis and cellulitis left foot (IV zosyn), AKI on CKD, rapid afib (Cardizem + Heparin gtt), NSTEMI suspect demand ischemia, anemia acute on chronic. Transferred to ICU and intubated 11/2 after evolving neuro changes with unresponsiveness after coughing episode. 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    PT Comments    Pt was long sitting in bed with LUE mitt donned and all ICU monitoring in place. Place feeds on hold during session. Removed mitt and pt was able to remain Mitt free throughout session without pulling at lines or tubes. She was alert but difficult to assess if oriented or not due to expressive aphasia. She smiles and attempts to respond at appropriate times. Gets frustrated when unable to get desired communication across. Smiles and even laughs at appropriate times. She required max +1 assist to achieve EOB sitting. Sat EOB for > 10 minutes throughout with mostly CGA. Occasional min assist to prevent LOB but only when performing ther ex at EOB. She was able to perform 10 reps LAQ, seated marching and tricep press bilaterally with increased time and constant vcs/tcs. HR was less than 110bpm during session and per RN has been much improved/ consistent from previous date. She tolerated session well but is unsafe to try to attempt standing. She is NWB LLE and will require +2  assistance. Acute PT will continue to progress pt as able per current POC.    Recommendations for follow up therapy are one component of a multi-disciplinary discharge planning process, led by the attending physician.  Recommendations may be updated based on patient status, additional functional criteria and insurance authorization.  Follow Up Recommendations  Acute inpatient rehab (3hours/day)     Assistance Recommended at Discharge Frequent or constant Supervision/Assistance  Equipment Recommendations  Other (comment) (defer to next level of care)       Precautions / Restrictions Precautions Precautions: Fall Restrictions Weight Bearing Restrictions: Yes LLE Weight Bearing: Non weight bearing     Mobility  Bed Mobility Overal bed mobility: Needs Assistance Bed Mobility: Supine to Sit;Sit to Supine     Supine to sit: Max assist;HOB elevated Sit to supine: Max assist;HOB elevated;Total assist   General bed mobility comments: Pt was able to achieve EOB short sit with max assist of one. She was able to progress LEs towards EOB without assistance however required assistance to fully get LEs off EOB and with trunk assist to short sit. Once fatigued from EOB activity. max-total to return to supine and reposition to [facility].    Transfers Overall transfer level: Needs assistance    General transfer comment: unsafe at this time due to wt bering restrictions and inconsistency with following commands. Will need +2 for OOB activity.        Balance Overall balance assessment: Needs assistance Sitting-balance support: Single extremity supported;Feet unsupported Sitting balance-Leahy Scale: Fair Sitting balance - Comments: Pt sat EOB with CGA mostly however several minutes of supervision once RLE supported on floor and  BUE supported. Pt was able to perform ther ex at EOB without assistance to maintain balance at EOB. performed LAQ, seated marching and Triceps press all at EOB Bilaterally        Cognition Arousal/Alertness: Awake/alert Behavior During Therapy: WFL for tasks assessed/performed Overall Cognitive Status: Impaired/Different from baseline Area of Impairment: Following commands;Safety/judgement;Awareness      Following Commands: Follows one step commands inconsistently (Pt followed simple commands more often then not but does require increased time to process and perform desired task requested.) Safety/Judgement: Decreased awareness of safety;Decreased awareness of deficits Awareness: Intellectual   General Comments: per RN, pt was much more alert and seemed more oriented than OT note suggests. pt was attempting to talk alot during session. Aphasia makes hrd to understand. Pt smiled and laughed appropriately when author jokes around about different situtions throughout session.        Exercises Other Exercises Other Exercises: LAQ, seated marching, tricep press. all 10x Bilaterally. pt fatigues quickly and needs constant encouragement to perform t=exercises through full ROM     Pertinent Vitals/Pain Facial Expression: Relaxed, neutral Body Movements: Absence of movements Muscle Tension: Relaxed Pain Intervention(s): Limited activity within patient's tolerance;Monitored during session;Premedicated before session;Repositioned (pt did not show any signs of pain throughout session. She was even smiling some throughout)     PT Goals (current goals can now be found in the care plan section) Acute Rehab PT Goals Patient Stated Goal: none stated Progress towards PT goals: Progressing toward goals    Frequency    7X/week      PT Plan Current plan remains appropriate    Co-evaluation     PT goals addressed during session: Mobility/safety with mobility;Balance;Strengthening/ROM        AM-PAC PT "6 Clicks" Mobility   Outcome Measure  Help needed turning from your back to your side while in a flat bed without using bedrails?: A Lot Help needed  moving from lying on your back to sitting on the side of a flat bed without using bedrails?: A Lot Help needed moving to and from a bed to a chair (including a wheelchair)?: Total Help needed standing up from a chair using your arms (e.g., wheelchair or bedside chair)?: Total Help needed to walk in hospital room?: Total Help needed climbing 3-5 steps with a railing? : Total 6 Click Score: 8    End of Session   Activity Tolerance: Patient tolerated treatment well;Patient limited by fatigue Patient left: in bed;with call bell/phone within reach Nurse Communication: Mobility status PT Visit Diagnosis: Other abnormalities of gait and mobility (R26.89);Hemiplegia and hemiparesis;Muscle weakness (generalized) (M62.81);History of falling (Z91.81) Hemiplegia - Right/Left: Right Hemiplegia - dominant/non-dominant: Dominant     Time: 1355-1410 PT Time Calculation (min) (ACUTE ONLY): 15 min  Charges:  $Therapeutic Activity: 8-22 mins                    Julaine Fusi PTA 11/16/20, 4:57 PM

## 2020-11-16 NOTE — Progress Notes (Signed)
   PODIATRY PROGRESS NOTE  NAME Autumn [name] MRN [id number] DOB 29-Aug-1950 DOA 11/09/2020   Patient ICU  rm 10 S/P incision and drainage.  Second ray resection LT.  DOS: 11/11/2020 with cerebral infarct postoperatively in the ICU 11/13/2020.  Patient nonverbal.  Limited commands.   ASSESSMENT/PLAN OF CARE 1.  S/P second ray amputation. I&D LT foot. DOS: 11/11/2020 -Dressings changed. -The foot continues to remain purulent with heavy drainage and will still need return to the OR with irrigation debridement likely early next week once the patient stabilized and cleared from an ICU/hospitalist standpoint. -Orders placed for dressing changes every other day -Podiatry will see the patient Monday and schedule return to the OR once the patient has been stabilized -We will continue to follow     Past Medical History:  Diagnosis Date   Diabetes mellitus without complication ([place])    Hypertension    Kidney stone    Thyroid disease     CBC Latest Ref Rng & Units 11/16/2020 11/15/2020 11/14/2020  WBC 4.0 - 10.5 K/uL 10.8(H) 12.8(H) 14.5(H)  Hemoglobin 12.0 - 15.0 g/dL 8.0(L) 8.3(L) 8.5(L)  Hematocrit 36.0 - 46.0 % 25.2(L) 26.1(L) 26.7(L)  Platelets 150 - 400 K/uL 168 197 195    BMP Latest Ref Rng & Units 11/16/2020 11/15/2020 11/14/2020  Glucose 70 - 99 mg/dL 166(H) 147(H) 183(H)  BUN 8 - 23 mg/dL 99(H) 106(H) 85(H)  Creatinine 0.44 - 1.00 mg/dL 4.79(H) 5.23(H) 4.91(H)  Sodium 135 - 145 mmol/L 139 134(L) 133(L)  Potassium 3.5 - 5.1 mmol/L 3.9 3.9 4.5  Chloride 98 - 111 mmol/L 101 102 104  CO2 22 - 32 mmol/L 25 19(L) 16(L)  Calcium 8.9 - 10.3 mg/dL 8.2(L) 8.3(L) 8.9    Please contact me directly with any questions or concerns.  Cell [phone]   Edrick Kins, DPM [facility]  Dr. Edrick Kins, DPM    [address], [place] [address]                Office [phone]  Fax [phone]

## 2020-11-16 NOTE — Progress Notes (Signed)
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  She'd been following with [name]rist outpatient and had recently refused [facility].  She'd recently been treated with antibiotics at [name] for the diabetic foot infection with enterobacter aerogans culture positive.  She was admitted on 10/29 due to falls at home with worsening L foot chronic diabetic ulcer with osteomyelitis of the 2nd and 3rd metatarsals and cellulitis noted on MRI.  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  Her post operative course was complicated by unresponsiveness and neurologic changes, she was found to have [name] stroke and was intubated for airway protection.  She was extubated 11/3 and transferred to [name] service on 11/4.  See below for additional details 10/29: presented to [name] ER s/p multiple falls, 5-weeks duration left foot pain, swelling, redness, hit head x2 during fall. ER workup: EKG showed rapid afib, CT head unremarkable, [place] Left LE no DVT, CXR unremarkable, XR L Foot concerning for osteomyelitis 2nd + 3rd metatarsals with soft tissue swelling of foot and ankle. Also with elevated BNP, elevated troponin, rapid Afib, concern for early sepsis, elevated BUN/cr acute renal failure on CKD (baseline Cr 3.19).  10/29: Admitted to floor, started on cardizem infusion for rate control,continuous heparin infusion for anticoagulation in setting of afib. IV Cefepime, vancomycin, flagyl initiated. Podiatry, cardiology, nephrology consulted. MRI left foot: septic arthritis of the second MTP joint and osteomyelitis of the second metatarsal and second proximal phalanx also with concern for an abscess and  cellulitis 10/31: [facility] completed: Left foot Amputation 2nd metatarsal and toe, I&D deep abscess multiple fascial planes, bone biopsy open deep third metatarsal. Note: Purulence found tracking up along the extensor tendons 11/1: ID consulted for infection mgmt; antibiotics adjusted to zosyn 11/2: Unable to complete MRI foot due to agitation despite IV ativan administration. Developed new confusion per RN which progressively worsened over the morning. Became unresponsive after coughing episode during pill swallowing; rapid response paged, stroke alert paged.  11/2: Transferred to ICU rm 10 after CT Head per stroke alert. Patient awake, attempting to follow simple commands however unable to lift arms on command. Tracking, responds to voice; however speech is garbled and unintelligible. When asked if in pain, patient nods and speaks but garbled. + oral secretions, concern for airway protection and patient was emergently intubated.  Notified patient's contact person [name], who arrived post intubation.  11/2: MRI Brain showed acute to early subacute infarction in the left posterior frontal cortical and subcortical brain, possibly affecting the precentral gyrus. There was no mass effect or hemorrhage. EEG with moderate diffuse slowing and occasional brief periods of intervening suppression, both indicative of cerebral dysfunction, medication effect, or both. No electrographic seizures or epileptiform discharges seen.  11/3: Remains MV, on propofol and fentanyl gtts. Turns head to voice, following limited commands.  11/3 extubated 11/4 TRH taking over  Assessment & Plan:   Principal Problem:   Sepsis ([name]) Active Problems:   Atrial fibrillation with RVR ([name])   Osteomyelitis of foot, left, acute ([name])   CKD stage 4 due to type 2 diabetes mellitus ([name])   Liver cirrhosis secondary to  NASH (nonalcoholic steatohepatitis) ([name])   Hypertension   History of anemia due to CKD   Frequent falls   AKI  (acute kidney injury) ([name])   Cellulitis and abscess of foot, except toes   Infectious tenosynovitis   Wheeze   Atrial fibrillation with rapid ventricular response [name])  Stroke Neurology suspects periprocedural vs related to atrial fibrillation MRI brian with acute to early subacute infarction in the L posterior frontal cortical and subcortical brain, possibly affecting the precentral gyrus.  No mass effect or hemorrhage.  Chronic small vessel ischemic changes, some with chronic punctate hemosiderin deposition. Echo with EF 55-60%, dilated LA, patent foramen ovale She'll need completion of workup eventually with vascular imaging of head and neck, will defer timing to neurology (per neurology, once she's more stable) PT/OT/SLP Per neurology, benefits of resuming heparin likely outweigh risks of hemorrhagic conversion or hypertensive hemorrhage - will plan to resume heparin gtt  72/0  Acute Metabolic Encephalopathy Related to acute infection and above Delirium precautions Follow and w/u further as indicated  Septic Shock Diabetic Foot Infection Left Foot Osteomyelitis and Cellulitis  Septic Arthritis MRI foot with septic arthritis of second MTP joint and osteomyelitis of 2nd metatarsal head and second proximal phalanx, severe soft tissue swelling around second metatarsal neck concerning for phlegmon developing abscess measuring 2.1x1 cm circumferentially around distal shaft.  Mild early osteomyelitis of 3rd metatarsal head.  Severe soft tissue edema of the forefoot c/w cellulitis. MRI ankle limited -> interval resection of 2nd metatarsal, longitudinal tear of peroneus brevis with peroneus tenosynovitis, distal tibialis posterior tenosynovitis and tendinopathy, extensor digitorum tenosynovitis.  Extensive subcutaneous edema circumferentially in distal calf.  Dorsal subcutaneous edema in foot. S/p 2nd ray amputation, I&D, and bone bx of 3rd metatarsal on 11/1 Blood cx 11/2 NGx2 Surgical cx  with pasteurella multocida and rare strep mitis/oralis as well as MSSA Transitioned to unasyn per ID, appreciate assistance Podiatry following, needs to return to OR at some point for further I&D +/- amputation, but holding off with recent stroke NWB LLE  Acute Hypoxic Respiratory Failure Aspiration Pneumonia CXR 11/3 with right lung opacity concerning for superimposed infection - also with findings concerning for pulmonary edema and small right effusion CXR 11/4 with improvement, vascular congestion, no persistent interstitial or airspace edema Already on unasyn Resp cx with rare yeast, will follow final cx  Atrial Fibrillation with RVR Appreciate cardiology recommendations Planning to resume heparin per neurology On dilt infusion at this time  Dysphagia 2/2 stroke SLP eval NG placed and tube feeds started.  Dysphagia 1 diet.  AKI on CKD IV Baseline creatinine around 3.2 Improved today, continue to monitor Avoid nephrotoxins  Elevated Troponin Cardiology following, thought 2/2 demand  Hx NASH cirrhosis Noted  Hypothyroidism Synthroid  T2DM SSI, follow A1c 6.3  Anemia Stable Labs with iron def Follow B12 elevated, folate wnl  DVT prophylaxis: SCD Code Status:full  Family Communication: none at bedside - called friend in chart 11/4 Disposition:   Status is: Inpatient  Remains inpatient appropriate because: continued need for IV therapies, consultant care       Consultants:  PCCM Nephrology Neurology ID Podiatry cardiology  Procedures:  EEG Impression and clinical correlation: This EEG was obtained while sedated and asleep and is abnormal due to moderate diffuse slowing and occasional brief periods of intervening suppression both indicative of cerebral dysfunction, medication effect, or both. Echo IMPRESSIONS     1. Left ventricular ejection fraction, by estimation, is 55 to 60%. The  left ventricle  has normal function. The left ventricle has no  regional  wall motion abnormalities. There is mild concentric left ventricular  hypertrophy. Left ventricular diastolic  parameters are indeterminate. Elevated left ventricular end-diastolic  pressure.   2. Right ventricular systolic function is normal. The right ventricular  size is normal. There is normal pulmonary artery systolic pressure.   3. Left atrial size was severely dilated.   4. The mitral valve is normal in structure. Trivial mitral valve  regurgitation. No evidence of mitral stenosis.   5. The aortic valve is tricuspid. Aortic valve regurgitation is not  visualized. No aortic stenosis is present.   6. The inferior vena cava is normal in size with greater than 50%  respiratory variability, suggesting right atrial pressure of 3 mmHg.   7. There is Griselle Rufer small patent foramen ovale.  Procedures:             1) amputation ray second metatarsal and toe             2) incision and drainage deep abscess multiple fascial planes             3) bone biopsy open deep third metatarsal Antimicrobials:  Anti-infectives (From admission, onward)    Start     Dose/Rate Route Frequency Ordered Stop   11/14/20 2200  Ampicillin-Sulbactam (UNASYN) 3 g in sodium chloride 0.9 % 100 mL IVPB        3 g 200 mL/hr over 30 Minutes Intravenous Every 12 hours 11/14/20 1610     11/14/20 0900  piperacillin-tazobactam (ZOSYN) IVPB 2.25 g  Status:  Discontinued        2.25 g 100 mL/hr over 30 Minutes Intravenous Every 8 hours 11/14/20 0805 11/14/20 1609   11/13/20 2200  piperacillin-tazobactam (ZOSYN) IVPB 3.375 g  Status:  Discontinued        3.375 g 12.5 mL/hr over 240 Minutes Intravenous Every 12 hours 11/13/20 0758 11/14/20 0805   11/13/20 0600  piperacillin-tazobactam (ZOSYN) IVPB 2.25 g        2.25 g 100 mL/hr over 30 Minutes Intravenous Every 8 hours 11/12/20 2155 11/13/20 1725   11/11/20 1600  vancomycin (VANCOCIN) IVPB 1000 mg/200 mL premix  Status:  Discontinued        1,000 mg 200 mL/hr over  60 Minutes Intravenous Every 48 hours 11/09/20 1618 11/10/20 1045   11/11/20 1558  vancomycin (VANCOCIN) powder  Status:  Discontinued          As needed 11/11/20 1558 11/11/20 1558   11/10/20 2000  vancomycin (VANCOCIN) IVPB 1000 mg/200 mL premix  Status:  Discontinued        1,000 mg 200 mL/hr over 60 Minutes Intravenous Every 48 hours 11/10/20 1918 11/12/20 0940   11/09/20 1800  ceFEPIme (MAXIPIME) 2 g in sodium chloride 0.9 % 100 mL IVPB  Status:  Discontinued        2 g 200 mL/hr over 30 Minutes Intravenous Every 24 hours 11/09/20 1619 11/12/20 2146   11/09/20 1619  vancomycin variable dose per unstable renal function (pharmacist dosing)  Status:  Discontinued         Does not apply See admin instructions 11/09/20 1619 11/12/20 1521   11/09/20 1615  vancomycin (VANCOREADY) IVPB 1750 mg/350 mL        1,750 mg 175 mL/hr over 120 Minutes Intravenous  Once 11/09/20 1606 11/09/20 1947   11/09/20 1600  metroNIDAZOLE (FLAGYL) IVPB 500 mg  Status:  Discontinued  500 mg 100 mL/hr over 60 Minutes Intravenous Every 8 hours 11/09/20 1555 11/12/20 2146       Subjective: Sleepy today, shakes head yes and no aphasic  Objective: Vitals:   11/16/20 0700 11/16/20 0800 11/16/20 0900 11/16/20 1000  BP: 120/73 134/82 123/79 114/72  Pulse: (!) 102 (!) 101 72 (!) 151  Resp: 17 20 18 17   Temp:  97.9 F (36.6 C)    TempSrc:  Axillary    SpO2: 90% 97% 94% 92%  Weight:      Height:        Intake/Output Summary (Last 24 hours) at 11/16/2020 1238 Last data filed at 11/16/2020 1021 Gross per 24 hour  Intake 2870.63 ml  Output 1275 ml  Net 1595.63 ml   Filed Weights   11/09/20 1214 11/16/20 0330  Weight: 86.2 kg 91.7 kg    Examination:  General: No acute distress. Cardiovascular: Heart sounds show Syris Brookens regular rate, and rhythm. No gallops or rubs. No murmurs. No JVD. Lungs: Clear to auscultation bilaterally. Abdomen: Soft, nontender, nondistended  Neurological: aphasic, moving  extremities - follows commands generally Extremities: LLE with intact dressing    Data Reviewed: I have personally reviewed following labs and imaging studies  CBC: Recent Labs  Lab 11/12/20 0540 11/13/20 0203 11/14/20 0526 11/15/20 0942 11/16/20 0246  WBC 15.9* 15.9* 14.5* 12.8* 10.8*  NEUTROABS  --   --   --   --  8.5*  HGB 7.0* 8.5* 8.5* 8.3* 8.0*  HCT 21.9* 26.7* 26.7* 26.1* 25.2*  MCV 87.6 85.3 87.0 87.9 86.3  PLT 189 220 195 197 259    Basic Metabolic Panel: Recent Labs  Lab 11/09/20 1818 11/10/20 0649 11/12/20 0540 11/13/20 0203 11/14/20 0526 11/15/20 0942 11/16/20 0246  NA  --    < > 133* 132* 133* 134* 139  K  --    < > 4.5 4.1 4.5 3.9 3.9  CL  --    < > 105 104 104 102 101  CO2  --    < > 14* 18* 16* 19* 25  GLUCOSE  --    < > 131* 117* 183* 147* 166*  BUN  --    < > 76* 87* 85* 106* 99*  CREATININE  --    < > 3.88* 4.13* 4.91* 5.23* 4.79*  CALCIUM  --    < > 8.7* 8.5* 8.9 8.3* 8.2*  MG 2.0  --   --   --   --  2.4 2.4  PHOS  --   --   --   --  7.8*  --  5.9*   < > = values in this interval not displayed.    GFR: Estimated Creatinine Clearance: 12.8 mL/min (Kinleigh Nault) (by C-G formula based on SCr of 4.79 mg/dL (H)).  Liver Function Tests: Recent Labs  Lab 11/14/20 0526 11/16/20 0246  AST  --  19  ALT  --  18  ALKPHOS  --  59  BILITOT  --  0.9  PROT  --  5.6*  ALBUMIN 2.2* 2.1*    CBG: Recent Labs  Lab 11/15/20 1937 11/15/20 2314 11/16/20 0347 11/16/20 0758 11/16/20 1113  GLUCAP 166* 143* 150* 144* 174*     Recent Results (from the past 240 hour(s))  Resp Panel by RT-PCR (Flu Shizue Kaseman&B, Covid) Nasopharyngeal Swab     Status: None   Collection Time: 11/09/20  6:25 PM   Specimen: Nasopharyngeal Swab; Nasopharyngeal(NP) swabs in vial transport medium  Result Value Ref  Range Status   SARS Coronavirus 2 by RT PCR NEGATIVE NEGATIVE Final    Comment: (NOTE) SARS-CoV-2 target nucleic acids are NOT DETECTED.  The SARS-CoV-2 RNA is generally  detectable in upper respiratory specimens during the acute phase of infection. The lowest concentration of SARS-CoV-2 viral copies this assay can detect is 138 copies/mL. Jonnae Fonseca negative result does not preclude SARS-Cov-2 infection and should not be used as the sole basis for treatment or other patient management decisions. Exavior Kimmons negative result may occur with  improper specimen collection/handling, submission of specimen other than nasopharyngeal swab, presence of viral mutation(s) within the areas targeted by this assay, and inadequate number of viral copies(<138 copies/mL). Kya Mayfield negative result must be combined with clinical observations, patient history, and epidemiological information. The expected result is Negative.  Fact Sheet for Patients:  EntrepreneurPulse.com.au  Fact Sheet for Healthcare Providers:  IncredibleEmployment.be  This test is no t yet approved or cleared by the [place] FDA and  has been authorized for detection and/or diagnosis of SARS-CoV-2 by FDA under an Emergency Use Authorization (EUA). This EUA will remain  in effect (meaning this test can be used) for the duration of the COVID-19 declaration under Section 564(b)(1) of the Act, 21 U.S.C.section 360bbb-3(b)(1), unless the authorization is terminated  or revoked sooner.       Influenza Ettie Krontz by PCR NEGATIVE NEGATIVE Final   Influenza B by PCR NEGATIVE NEGATIVE Final    Comment: (NOTE) The Xpert Xpress SARS-CoV-2/FLU/RSV plus assay is intended as an aid in the diagnosis of influenza from Nasopharyngeal swab specimens and should not be used as Lelia Jons sole basis for treatment. Nasal washings and aspirates are unacceptable for Xpert Xpress SARS-CoV-2/FLU/RSV testing.  Fact Sheet for Patients: EntrepreneurPulse.com.au  Fact Sheet for Healthcare Providers: IncredibleEmployment.be  This test is not yet approved or cleared by the [place] FDA  and has been authorized for detection and/or diagnosis of SARS-CoV-2 by FDA under an Emergency Use Authorization (EUA). This EUA will remain in effect (meaning this test can be used) for the duration of the COVID-19 declaration under Section 564(b)(1) of the Act, 21 U.S.C. section 360bbb-3(b)(1), unless the authorization is terminated or revoked.  Performed at [name], [address] [place], [place] [address]   MRSA Next Gen by PCR, Nasal     Status: None   Collection Time: 11/10/20  1:14 AM   Specimen: Nasal Mucosa; Nasal Swab  Result Value Ref Range Status   MRSA by PCR Next Gen NOT DETECTED NOT DETECTED Final    Comment: (NOTE) The GeneXpert MRSA Assay (FDA approved for NASAL specimens only), is one component of Charlee Squibb comprehensive MRSA colonization surveillance program. It is not intended to diagnose MRSA infection nor to guide or monitor treatment for MRSA infections. Test performance is not FDA approved in patients less than 69 years old. Performed at [name], [address] [place], [place] [address]   Aerobic/Anaerobic Culture w Gram Stain (surgical/deep wound)     Status: None (Preliminary result)   Collection Time: 11/11/20  3:23 PM   Specimen: Foot, Left; Abscess  Result Value Ref Range Status   Specimen Description   Final    WOUND LEFT FOOT ABSCESS Performed at [facility], [address] [place], [place] [address]    Special Requests   Final    NONE Performed at [name], [address] [place], [place] [address]    Gram Stain   Final    NO ORGANISMS  SEEN SQUAMOUS EPITHELIAL CELLS PRESENT MODERATE WBC PRESENT, PREDOMINANTLY MONONUCLEAR MODERATE GRAM POSITIVE COCCI    Culture   Final    RARE STAPHYLOCOCCUS AUREUS RARE PASTEURELLA MULTOCIDA Usually susceptible to penicillin and other beta lactam agents,quinolones,macrolides and tetracyclines. HOLDING FOR POSSIBLE ANAEROBE Performed at [facility], [place] [address], [place], [place] [address]    Report Status PENDING  Incomplete   Organism ID, Bacteria STAPHYLOCOCCUS AUREUS  Final      Susceptibility   Staphylococcus aureus - MIC*    CIPROFLOXACIN <=0.5 SENSITIVE Sensitive     ERYTHROMYCIN <=0.25 SENSITIVE Sensitive     GENTAMICIN <=0.5 SENSITIVE Sensitive     OXACILLIN <=0.25 SENSITIVE Sensitive     TETRACYCLINE <=1 SENSITIVE Sensitive     VANCOMYCIN <=0.5 SENSITIVE Sensitive     TRIMETH/SULFA <=10 SENSITIVE Sensitive     CLINDAMYCIN <=0.25 SENSITIVE Sensitive     RIFAMPIN <=0.5 SENSITIVE Sensitive     Inducible Clindamycin NEGATIVE Sensitive     * RARE STAPHYLOCOCCUS AUREUS  Aerobic/Anaerobic Culture w Gram Stain (surgical/deep wound)     Status: None (Preliminary result)   Collection Time: 11/11/20  3:32 PM   Specimen: Other Source; Tissue  Result Value Ref Range Status   Specimen Description   Final    WOUND 2ND METATARSAL Performed at [name], [address] [place], [place] [address]    Special Requests   Final    NONE Performed at [name], [address] [place], [place] [address]    Gram Stain   Final    NO ORGANISMS SEEN SQUAMOUS EPITHELIAL CELLS PRESENT FEW WBC PRESENT, PREDOMINANTLY MONONUCLEAR FEW GRAM POSITIVE COCCI    Culture   Final    RARE STREPTOCOCCUS MITIS/ORALIS RARE PASTEURELLA MULTOCIDA Usually susceptible to penicillin and other beta lactam agents,quinolones,macrolides and tetracyclines. ABUNDANT BACTEROIDES SPECIES BETA LACTAMASE POSITIVE CULTURE REINCUBATED FOR BETTER GROWTH Performed at [facility], [place] [address], [place], [place] [address]    Report Status PENDING  Incomplete   Organism ID, Bacteria STREPTOCOCCUS MITIS/ORALIS  Final      Susceptibility   Streptococcus mitis/oralis - MIC*    TETRACYCLINE 0.5 SENSITIVE Sensitive     VANCOMYCIN 0.5 SENSITIVE Sensitive     CLINDAMYCIN <=0.25 SENSITIVE Sensitive     * RARE STREPTOCOCCUS  MITIS/ORALIS  Aerobic/Anaerobic Culture w Gram Stain (surgical/deep wound)     Status: None (Preliminary result)   Collection Time: 11/11/20  4:26 PM   Specimen: Other Source; Tissue  Result Value Ref Range Status   Specimen Description   Final    WOUND LEFT METATARSAL Performed at [facility], [address] [place], [place] [address]    Special Requests   Final    NONE Performed at [name], [place], [place] [address]    Gram Stain NO WBC SEEN NO ORGANISMS SEEN   Final   Culture   Final    NO GROWTH 4 DAYS NO ANAEROBES ISOLATED; CULTURE IN PROGRESS FOR 5 DAYS Performed at [facility] [address] [place], [place] [address]    Report Status PENDING  Incomplete  MRSA Next Gen by PCR, Nasal     Status: None   Collection Time: 11/13/20 11:22 AM   Specimen: Nasal Mucosa; Nasal Swab  Result Value Ref Range Status   MRSA by PCR Next Gen NOT DETECTED NOT DETECTED Final    Comment: (NOTE) The GeneXpert MRSA Assay (FDA approved for NASAL specimens  only), is one component of Shermika Balthaser comprehensive MRSA colonization surveillance program. It is not intended to diagnose MRSA infection nor to guide or monitor treatment for MRSA infections. Test performance is not FDA approved in patients less than 36 years old. Performed at [name], [address] [place], [place] [address]   CULTURE, BLOOD (ROUTINE X 2) w Reflex to ID Panel     Status: None (Preliminary result)   Collection Time: 11/13/20 12:44 PM   Specimen: BLOOD  Result Value Ref Range Status   Specimen Description BLOOD LEFT ANTECUBITAL  Final   Special Requests   Final    BOTTLES DRAWN AEROBIC AND ANAEROBIC Blood Culture adequate volume   Culture   Final    NO GROWTH 3 DAYS Performed at [name], [address], [place], [place] [address]    Report Status PENDING  Incomplete  CULTURE, BLOOD (ROUTINE X 2) w Reflex to ID Panel     Status: None  (Preliminary result)   Collection Time: 11/13/20 12:52 PM   Specimen: BLOOD  Result Value Ref Range Status   Specimen Description BLOOD BLOOD LEFT HAND  Final   Special Requests   Final    BOTTLES DRAWN AEROBIC ONLY Blood Culture results may not be optimal due to an inadequate volume of blood received in culture bottles   Culture   Final    NO GROWTH 3 DAYS Performed at [facility], [address], [place], [place] [address]    Report Status PENDING  Incomplete  Culture, Respiratory w Gram Stain     Status: None (Preliminary result)   Collection Time: 11/13/20  3:43 PM   Specimen: Tracheal Aspirate; Respiratory  Result Value Ref Range Status   Specimen Description   Final    TRACHEAL ASPIRATE Performed at [facility], [address] [place], [place] [address]    Special Requests   Final    NONE Performed at [name], [address] [place], [place] [address]    Gram Stain   Final    NO SQUAMOUS EPITHELIAL CELLS SEEN FEW WBC SEEN NO ORGANISMS SEEN    Culture   Final    RARE YEAST CULTURE REINCUBATED FOR BETTER GROWTH Performed at [facility], [place] [address], [place], [place] [address]    Report Status PENDING  Incomplete         Radiology Studies: DG Abd 1 View  Result Date: 11/15/2020 CLINICAL DATA:  Evaluate feeding catheter placement EXAM: ABDOMEN - 1 VIEW COMPARISON:  Film from earlier in the same day. FINDINGS: Catheter is been manipulated and now lies in the mid to distal stomach directed towards the pylorus. IMPRESSION: Gastric catheter within the mid to distal stomach directed towards the pylorus. Electronically Signed   By: Inez Catalina M.D.   On: 11/15/2020 22:23   DG Abd 1 View  Result Date: 11/15/2020 CLINICAL DATA:  Evaluate feeding catheter EXAM: ABDOMEN - 1 VIEW COMPARISON:  None. FINDINGS: Scattered large and small bowel gas is noted. Weighted feeding catheter is noted coiled in the mid stomach. No free air is  seen. IMPRESSION: Feeding catheter in the mid stomach. Electronically Signed   By: Inez Catalina M.D.   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 EXAM: PORTABLE  CHEST 1 VIEW COMPARISON:  11/14/2020 and older studies. FINDINGS: Since the prior exam, the endotracheal tube and the nasal/orogastric tube have been removed. Cardiac silhouette is normal in size. No mediastinal or hilar masses. Vascular congestion has improved. Lung base opacities have cleared. Lungs are now clear. No pneumothorax.  Possible small effusions. Skeletal structures are grossly intact. IMPRESSION: 1. Significant improvement. There is residual vascular congestion, but no persistent interstitial or airspace edema. 2. Status post removal of the endotracheal tube and nasal/orogastric tube. Electronically Signed   By: Lajean Manes M.D.   On: 11/15/2020 15:58        Scheduled Meds:  acetaminophen  650 mg Oral Once   allopurinol  200 mg Oral Daily   vitamin C  500 mg Oral BID   budesonide (PULMICORT) nebulizer solution  0.5 mg Nebulization BID   Chlorhexidine Gluconate Cloth  6 each Topical Daily   diltiazem  60 mg Per Tube Q6H   docusate  100 mg Per Tube BID   feeding supplement (NEPRO CARB STEADY)  237 mL Oral TID BM   ferrous gluconate  324 mg Oral Q breakfast   free water  30 mL Per Tube Q4H   insulin aspart  0-6 Units Subcutaneous Q4H   ipratropium-albuterol  3 mL Nebulization TID   levothyroxine  100 mcg Oral Q0600   mouth rinse  15 mL Mouth Rinse BID   metoprolol tartrate  25 mg Oral BID   multivitamin with minerals  1 tablet Oral Daily   nutrition supplement (JUVEN)  1 packet Per Tube BID BM   pantoprazole (PROTONIX) IV  40 mg Intravenous Daily   polyethylene glycol  17 g Per Tube Daily   venlafaxine  100 mg Oral BID    Continuous Infusions:  sodium chloride Stopped (11/11/20 0850)   ampicillin-sulbactam (UNASYN) IV Stopped (11/16/20 0936)   diltiazem (CARDIZEM) infusion Stopped (11/15/20 1726)   feeding supplement (VITAL 1.5 CAL) 30 mL/hr at 11/15/20 2230   heparin 1,650 Units/hr (11/16/20 1021)   lactated ringers 75 mL/hr at 11/16/20 1039     LOS: 7 days    Time spent: over 30 min    Fayrene Helper, MD [facility]   To contact the attending provider between 7A-7P or the covering provider during after hours 7P-7A, please log into the web site [email or web address] and access using universal [place] password for that web site. If you do not have the password, please call the hospital operator.  11/16/2020, 12:38 PM

## 2020-11-16 NOTE — Progress Notes (Signed)
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 Other comorbidities include sepsis without shock secondary to osteomyelitis and cellulitis left foot (IV zosyn), AKI on CKD, rapid afib (Cardizem + Heparin gtt), NSTEMI suspect demand ischemia, anemia acute on chronic. Transferred to ICU and intubated 11/2 after evolving neuro changes with unresponsiveness after coughing episode. 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   OT comments  Autumn [name] was seen for OT treatment on this date. Upon arrival to room pt reclined in bed, RN at bedside assisting with feeding, agreeable to tx. Pt continues to require MAX A x2 for bed mobility however increased participation and command following to exit bed. Pt tolerated ~ 10 min sitting EOB c MIN A for R lateral lean + varying UE support. MAX A hand over hand self-feeding sitting EOB (+2 assist for sitting balance) - pt maintains R grip t/o. MAX A for LBD at bed level. Pt unable to flex R elbow on command however when handed a rolled washcloth exhibits full bicep ROM with 3+/5 strength while mimicking throwing washcloth to OT.   Pt responds to loud verbal and/or tactile stimuli with increased time. Decreased response to visual stimuli (R worse than L). Follows 1 step commands with multimodal cues and increased time (squeezes R hand in response to verbal command +  tactile input 3/3 trials). Attempts 1 word vocalizations with audible "orange," "pretty," and "Autumn [name]."  Pt making good progress toward goals. Pt continues to benefit from skilled OT services to maximize return to PLOF and minimize risk of future falls, injury, caregiver burden, and readmission. Will continue to follow POC. Pt continues to be excellent candidate for AIR.     Recommendations for follow up therapy are one component of a multi-disciplinary discharge planning process, led by the attending physician.  Recommendations may be updated based on patient status, additional functional criteria and insurance authorization.    Follow Up Recommendations  Acute inpatient rehab (3hours/day)    Assistance Recommended at Discharge Frequent or constant Supervision/Assistance  Equipment Recommendations  Other (comment) (defer to next venue of care)    Recommendations for Other Services Rehab consult    Precautions / Restrictions Precautions Precautions: Fall Restrictions Weight Bearing Restrictions: Yes LLE Weight Bearing: Non weight bearing       Mobility Bed Mobility Overal bed mobility: Needs Assistance Bed Mobility: Supine to Sit;Sit to Supine     Supine to sit: Max assist;+2 for physical assistance Sit to supine: Max assist;+2 for physical assistance        Transfers                         Balance Overall balance assessment: Needs assistance Sitting-balance support: Single extremity supported;Feet unsupported Sitting balance-Leahy Scale: Poor Sitting balance - Comments: MIN A for R lateral lean Postural control: Right lateral lean                                 ADL either  performed or assessed with clinical judgement   ADL Overall ADL's : Needs assistance/impaired                                       General ADL Comments: Pt tolerated ~ 10 min sitting EOB c MIN A for R lateral lean + varying UE support. MAX A hand over hand self-feeding sitting EOB (+2 assist for sitting balance) - pt maintains R grip t/o. MAX A for LBD at  bed level.      Cognition Arousal/Alertness: Awake/alert Behavior During Therapy: Flat affect Overall Cognitive Status: Impaired/Different from baseline Area of Impairment: Following commands;Safety/judgement;Awareness                       Following Commands: Follows one step commands inconsistently;Follows one step commands with increased time Safety/Judgement: Decreased awareness of safety;Decreased awareness of deficits Awareness: Intellectual   General Comments: Responds to loud verbal and tactile stimuli with increased time. Decreased response to visual stimuli (R worse than L). Follows 1 step commands with multimodal cues and increased time (squeezes R hand in response to verbal command +  tactile input 3/3 trials). Attempts audible 1 word vocalizations "orange" and "pretty"          Exercises Exercises: Other exercises Other Exercises Other Exercises: Pt educ re: importance of mvmt for functional mobility, neglect Other Exercises: self-feeding, neuromusculare re-education, sup<>sit, sitting balance/tolerance           Pertinent Vitals/ Pain       Pain Assessment: Faces Faces Pain Scale: Hurts even more Pain Location: LLE with mobility/touch Pain Descriptors / Indicators: Grimacing;Moaning Pain Intervention(s): Monitored during session;Limited activity within patient's tolerance;Repositioned         Frequency  Min 5X/week        Progress Toward Goals  OT Goals(current goals can now be found in the care plan section)  Progress towards OT goals: Progressing toward goals  Acute Rehab OT Goals OT Goal Formulation: Patient unable to participate in goal setting Time For Goal Achievement: 11/28/20 Potential to Achieve Goals: Good ADL Goals Pt Will Perform Grooming: with min assist;sitting Pt/caregiver will Perform Home Exercise Program: Increased ROM;Increased strength;Both right and left upper extremity;With minimal assist Additional ADL Goal #1: Pt  will follow 3/3 two-step commands with MOD cueing.  Plan Discharge plan remains appropriate;Frequency remains appropriate    Co-evaluation                 AM-PAC OT "6 Clicks" Daily Activity     Outcome Measure   Help from another person eating meals?: A Lot Help from another person taking care of personal grooming?: A Lot Help from another person toileting, which includes using toliet, bedpan, or urinal?: A Lot Help from another person bathing (including washing, rinsing, drying)?: A Lot Help from another person to put on and taking off regular upper body clothing?: A Lot Help from another person to put on and taking off regular lower body clothing?: A Lot 6 Click Score: 12    End of Session Equipment Utilized During Treatment: Oxygen  OT Visit Diagnosis: Other abnormalities of gait and mobility (R26.89);Hemiplegia and hemiparesis;Muscle weakness (generalized) (M62.81) Hemiplegia - Right/Left: Right   Activity Tolerance Patient tolerated treatment well;Patient limited by fatigue   Patient Left in bed;with bed alarm set;with nursing/sitter in room   Nurse Communication Mobility status  Time: 3414-4360 OT Time Calculation (min): 42 min  Charges: OT General Charges $OT Visit: 1 Visit OT Treatments $Self Care/Home Management : 23-37 mins $Neuromuscular Re-education: 8-22 mins  Dessie Coma, M.S. OTR/L  11/16/20, 1:01 PM  ascom [phone]

## 2020-11-17 ENCOUNTER — Inpatient Hospital Stay: Payer: Medicare (Managed Care)

## 2020-11-17 DIAGNOSIS — N179 Acute kidney failure, unspecified: Secondary | ICD-10-CM | POA: Diagnosis not present

## 2020-11-17 DIAGNOSIS — R6520 Severe sepsis without septic shock: Secondary | ICD-10-CM | POA: Diagnosis not present

## 2020-11-17 DIAGNOSIS — A419 Sepsis, unspecified organism: Secondary | ICD-10-CM | POA: Diagnosis not present

## 2020-11-17 DIAGNOSIS — I4891 Unspecified atrial fibrillation: Secondary | ICD-10-CM | POA: Diagnosis not present

## 2020-11-17 LAB — CBC WITH DIFFERENTIAL/PLATELET
Abs Immature Granulocytes: 0.62 10*3/uL — ABNORMAL HIGH (ref 0.00–0.07)
Basophils Absolute: 0 10*3/uL (ref 0.0–0.1)
Basophils Relative: 0 %
Eosinophils Absolute: 0.1 10*3/uL (ref 0.0–0.5)
Eosinophils Relative: 1 %
HCT: 26.2 % — ABNORMAL LOW (ref 36.0–46.0)
Hemoglobin: 8.2 g/dL — ABNORMAL LOW (ref 12.0–15.0)
Immature Granulocytes: 7 %
Lymphocytes Relative: 11 %
Lymphs Abs: 1 10*3/uL (ref 0.7–4.0)
MCH: 27.8 pg (ref 26.0–34.0)
MCHC: 31.3 g/dL (ref 30.0–36.0)
MCV: 88.8 fL (ref 80.0–100.0)
Monocytes Absolute: 0.6 10*3/uL (ref 0.1–1.0)
Monocytes Relative: 7 %
Neutro Abs: 6.9 10*3/uL (ref 1.7–7.7)
Neutrophils Relative %: 74 %
Platelets: 134 10*3/uL — ABNORMAL LOW (ref 150–400)
RBC: 2.95 MIL/uL — ABNORMAL LOW (ref 3.87–5.11)
RDW: 15.9 % — ABNORMAL HIGH (ref 11.5–15.5)
Smear Review: NORMAL
WBC: 9.3 10*3/uL (ref 4.0–10.5)
nRBC: 0 % (ref 0.0–0.2)

## 2020-11-17 LAB — AEROBIC/ANAEROBIC CULTURE W GRAM STAIN (SURGICAL/DEEP WOUND): Gram Stain: NONE SEEN

## 2020-11-17 LAB — COMPREHENSIVE METABOLIC PANEL
ALT: 18 U/L (ref 0–44)
AST: 19 U/L (ref 15–41)
Albumin: 2.3 g/dL — ABNORMAL LOW (ref 3.5–5.0)
Alkaline Phosphatase: 66 U/L (ref 38–126)
Anion gap: 9 (ref 5–15)
BUN: 97 mg/dL — ABNORMAL HIGH (ref 8–23)
CO2: 28 mmol/L (ref 22–32)
Calcium: 8.5 mg/dL — ABNORMAL LOW (ref 8.9–10.3)
Chloride: 102 mmol/L (ref 98–111)
Creatinine, Ser: 4.24 mg/dL — ABNORMAL HIGH (ref 0.44–1.00)
GFR, Estimated: 11 mL/min — ABNORMAL LOW (ref >60)
Glucose, Bld: 175 mg/dL — ABNORMAL HIGH (ref 70–99)
Potassium: 4.1 mmol/L (ref 3.5–5.1)
Sodium: 139 mmol/L (ref 135–145)
Total Bilirubin: 0.7 mg/dL (ref 0.3–1.2)
Total Protein: 5.8 g/dL — ABNORMAL LOW (ref 6.5–8.1)

## 2020-11-17 LAB — GLUCOSE, CAPILLARY
Glucose-Capillary: 148 mg/dL — ABNORMAL HIGH (ref 70–99)
Glucose-Capillary: 148 mg/dL — ABNORMAL HIGH (ref 70–99)
Glucose-Capillary: 157 mg/dL — ABNORMAL HIGH (ref 70–99)
Glucose-Capillary: 162 mg/dL — ABNORMAL HIGH (ref 70–99)
Glucose-Capillary: 167 mg/dL — ABNORMAL HIGH (ref 70–99)
Glucose-Capillary: 168 mg/dL — ABNORMAL HIGH (ref 70–99)

## 2020-11-17 LAB — HEPARIN LEVEL (UNFRACTIONATED)
Heparin Unfractionated: 1.03 IU/mL — ABNORMAL HIGH (ref 0.30–0.70)
Heparin Unfractionated: 0.5 IU/mL (ref 0.30–0.70)

## 2020-11-17 LAB — MAGNESIUM: Magnesium: 2.1 mg/dL (ref 1.7–2.4)

## 2020-11-17 LAB — PHOSPHORUS: Phosphorus: 4.9 mg/dL — ABNORMAL HIGH (ref 2.5–4.6)

## 2020-11-17 MED ORDER — HEPARIN (PORCINE) 25000 UT/250ML-% IV SOLN: 1000.0000 [IU]/h | INTRAVENOUS | Status: DC

## 2020-11-17 MED ORDER — MECLIZINE HCL 25 MG PO TABS
12.5000 mg | ORAL_TABLET | Freq: Three times a day (TID) | ORAL | Status: DC | PRN
  Filled 2020-11-17: qty 0.5

## 2020-11-17 MED ORDER — LEVOTHYROXINE SODIUM 100 MCG PO TABS
100.0000 ug | ORAL_TABLET | Freq: Every day | ORAL | Status: DC
Start: 2020-11-18 — End: 2020-11-21
  Administered 2020-11-18 – 2020-11-20 (×3): 100 ug
  Filled 2020-11-17 (×3): qty 1

## 2020-11-17 MED ORDER — ACETAMINOPHEN 325 MG PO TABS
650.0000 mg | ORAL_TABLET | Freq: Four times a day (QID) | ORAL | Status: DC | PRN
  Filled 2020-11-17: qty 2

## 2020-11-17 MED ORDER — VENLAFAXINE HCL 37.5 MG PO TABS
100.0000 mg | ORAL_TABLET | Freq: Two times a day (BID) | ORAL | Status: DC
Start: 2020-11-17 — End: 2020-11-24
  Administered 2020-11-17 – 2020-11-24 (×12): 100 mg
  Filled 2020-11-17 (×18): qty 1

## 2020-11-17 MED ORDER — METOPROLOL TARTRATE 25 MG PO TABS
25.0000 mg | ORAL_TABLET | Freq: Two times a day (BID) | ORAL | Status: DC
Start: 2020-11-17 — End: 2020-11-18
  Administered 2020-11-17 (×2): 25 mg
  Filled 2020-11-17 (×2): qty 1

## 2020-11-17 MED ORDER — ASCORBIC ACID 500 MG PO TABS
500.0000 mg | ORAL_TABLET | Freq: Two times a day (BID) | ORAL | Status: DC
  Administered 2020-11-17 – 2020-11-24 (×13): 500 mg
  Filled 2020-11-17 (×13): qty 1

## 2020-11-17 MED ORDER — NEPRO/CARBSTEADY PO LIQD: 237.0000 mL | Freq: Three times a day (TID) | ORAL | Status: DC

## 2020-11-17 MED ORDER — ALLOPURINOL 100 MG PO TABS
200.0000 mg | ORAL_TABLET | Freq: Every day | ORAL | Status: DC
Start: 2020-11-17 — End: 2020-11-23
  Administered 2020-11-17 – 2020-11-23 (×5): 200 mg
  Filled 2020-11-17 (×7): qty 2

## 2020-11-17 MED ORDER — ONDANSETRON HCL 4 MG/2ML IJ SOLN
4.0000 mg | Freq: Four times a day (QID) | INTRAMUSCULAR | Status: DC | PRN
  Administered 2020-11-28 – 2020-12-20 (×15): 4 mg via INTRAVENOUS
  Filled 2020-11-17 (×12): qty 2

## 2020-11-17 MED ORDER — ONDANSETRON HCL 4 MG PO TABS
4.0000 mg | ORAL_TABLET | Freq: Four times a day (QID) | ORAL | Status: DC | PRN
  Administered 2020-12-16 (×2): 4 mg
  Filled 2020-11-17 (×2): qty 1

## 2020-11-17 MED ORDER — ADULT MULTIVITAMIN W/MINERALS CH
1.0000 | ORAL_TABLET | Freq: Every day | ORAL | Status: DC
Start: 2020-11-17 — End: 2020-11-18
  Administered 2020-11-17 – 2020-11-18 (×2): 1
  Filled 2020-11-17 (×2): qty 1

## 2020-11-17 NOTE — Progress Notes (Signed)
[facility]  ROUNDING NOTE   Subjective:   Patient seen in the ICU Patient remains confused Patient offers no new specific physical complaints Patient does have a history of expressive aphasia   Objective:  Vital signs in last 24 hours:  Temp:  [97.7 F (36.5 C)-98.8 F (37.1 C)] 97.7 F (36.5 C) (11/06 0824) Pulse Rate:  [75-151] 92 (11/06 0824) Resp:  [13-30] 17 (11/06 0824) BP: (114-151)/(72-129) 134/93 (11/06 0700) SpO2:  [85 %-98 %] 96 % (11/06 0824) Weight:  [91.8 kg] 91.8 kg (11/06 0500)  Weight change: 0.1 kg Filed Weights   11/09/20 1214 11/16/20 0330 11/17/20 0500  Weight: 86.2 kg 91.7 kg 91.8 kg    Intake/Output: I/O last 3 completed shifts: In: 4665.4 [P.O.:150; I.V.:2855.8; NG/GT:1359.5; IV Piggyback:300.1] Out: 1975 [LYYTK:3546]   Intake/Output this shift:  No intake/output data recorded.  Physical Exam: General: NAD  Head: Atraumatic. Moist oral mucosal membranes  Eyes: Anicteric,   Lungs:  Basilar crackles, normal effort, [place] O2  Heart: Irregular rhythm  Abdomen:  Soft, nontender  Extremities:  no peripheral edema.  Surgical dressing on left foot  Neurologic: Alert and able to follow commands   Skin: Warm        Basic Metabolic Panel: Recent Labs  Lab 11/13/20 0203 11/14/20 0526 11/15/20 0942 11/16/20 0246 11/17/20 0815  NA 132* 133* 134* 139 139  K 4.1 4.5 3.9 3.9 4.1  CL 104 104 102 101 102  CO2 18* 16* 19* 25 28  GLUCOSE 117* 183* 147* 166* 175*  BUN 87* 85* 106* 99* 97*  CREATININE 4.13* 4.91* 5.23* 4.79* 4.24*  CALCIUM 8.5* 8.9 8.3* 8.2* 8.5*  MG  --   --  2.4 2.4 2.1  PHOS  --  7.8*  --  5.9* 4.9*    Liver Function Tests: Recent Labs  Lab 11/14/20 0526 11/16/20 0246 11/17/20 0815  AST  --  19 19  ALT  --  18 18  ALKPHOS  --  59 66  BILITOT  --  0.9 0.7  PROT  --  5.6* 5.8*  ALBUMIN 2.2* 2.1* 2.3*   No results for input(s): LIPASE, AMYLASE in the last 168 hours. No results for input(s): AMMONIA in  the last 168 hours.  CBC: Recent Labs  Lab 11/13/20 0203 11/14/20 0526 11/15/20 0942 11/16/20 0246 11/17/20 0815  WBC 15.9* 14.5* 12.8* 10.8* 9.3  NEUTROABS  --   --   --  8.5* 6.9  HGB 8.5* 8.5* 8.3* 8.0* 8.2*  HCT 26.7* 26.7* 26.1* 25.2* 26.2*  MCV 85.3 87.0 87.9 86.3 88.8  PLT 220 195 197 168 134*    Cardiac Enzymes: No results for input(s): CKTOTAL, CKMB, CKMBINDEX, TROPONINI in the last 168 hours.   BNP: Invalid input(s): POCBNP  CBG: Recent Labs  Lab 11/16/20 1605 11/16/20 1949 11/16/20 2353 11/17/20 0401 11/17/20 0728  GLUCAP 157* 152* 207* 162* 148*    Microbiology: Results for orders placed or performed during the hospital encounter of 11/09/20  Resp Panel by RT-PCR (Flu A&B, Covid) Nasopharyngeal Swab     Status: None   Collection Time: 11/09/20  6:25 PM   Specimen: Nasopharyngeal Swab; Nasopharyngeal(NP) swabs in vial transport medium  Result Value Ref Range Status   SARS Coronavirus 2 by RT PCR NEGATIVE NEGATIVE Final    Comment: (NOTE) SARS-CoV-2 target nucleic acids are NOT DETECTED.  The SARS-CoV-2 RNA is generally detectable in upper respiratory specimens during the acute phase of infection. The lowest concentration of SARS-CoV-2  viral copies this assay can detect is 138 copies/mL. A negative result does not preclude SARS-Cov-2 infection and should not be used as the sole basis for treatment or other patient management decisions. A negative result may occur with  improper specimen collection/handling, submission of specimen other than nasopharyngeal swab, presence of viral mutation(s) within the areas targeted by this assay, and inadequate number of viral copies(<138 copies/mL). A negative result must be combined with clinical observations, patient history, and epidemiological information. The expected result is Negative.  Fact Sheet for Patients:  EntrepreneurPulse.com.au  Fact Sheet for Healthcare Providers:   IncredibleEmployment.be  This test is no t yet approved or cleared by the [place] FDA and  has been authorized for detection and/or diagnosis of SARS-CoV-2 by FDA under an Emergency Use Authorization (EUA). This EUA will remain  in effect (meaning this test can be used) for the duration of the COVID-19 declaration under Section 564(b)(1) of the Act, 21 U.S.C.section 360bbb-3(b)(1), unless the authorization is terminated  or revoked sooner.       Influenza A by PCR NEGATIVE NEGATIVE Final   Influenza B by PCR NEGATIVE NEGATIVE Final    Comment: (NOTE) The Xpert Xpress SARS-CoV-2/FLU/RSV plus assay is intended as an aid in the diagnosis of influenza from Nasopharyngeal swab specimens and should not be used as a sole basis for treatment. Nasal washings and aspirates are unacceptable for Xpert Xpress SARS-CoV-2/FLU/RSV testing.  Fact Sheet for Patients: EntrepreneurPulse.com.au  Fact Sheet for Healthcare Providers: IncredibleEmployment.be  This test is not yet approved or cleared by the [place] FDA and has been authorized for detection and/or diagnosis of SARS-CoV-2 by FDA under an Emergency Use Authorization (EUA). This EUA will remain in effect (meaning this test can be used) for the duration of the COVID-19 declaration under Section 564(b)(1) of the Act, 21 U.S.C. section 360bbb-3(b)(1), unless the authorization is terminated or revoked.  Performed at [facility], [address] [place], [place] [address]   MRSA Next Gen by PCR, Nasal     Status: None   Collection Time: 11/10/20  1:14 AM   Specimen: Nasal Mucosa; Nasal Swab  Result Value Ref Range Status   MRSA by PCR Next Gen NOT DETECTED NOT DETECTED Final    Comment: (NOTE) The GeneXpert MRSA Assay (FDA approved for NASAL specimens only), is one component of a comprehensive MRSA colonization surveillance program. It is not intended to  diagnose MRSA infection nor to guide or monitor treatment for MRSA infections. Test performance is not FDA approved in patients less than 33 years old. Performed at [facility], [address] [place], [place] [address]   Aerobic/Anaerobic Culture w Gram Stain (surgical/deep wound)     Status: None (Preliminary result)   Collection Time: 11/11/20  3:23 PM   Specimen: Foot, Left; Abscess  Result Value Ref Range Status   Specimen Description   Final    WOUND LEFT FOOT ABSCESS Performed at [facility], [address] [place], [place] [address]    Special Requests   Final    NONE Performed at [facility], [address] [place], [place] [address]    Gram Stain   Final    NO ORGANISMS SEEN SQUAMOUS EPITHELIAL CELLS PRESENT MODERATE WBC PRESENT, PREDOMINANTLY MONONUCLEAR MODERATE GRAM POSITIVE COCCI Performed at [facility], [place] [address], [place], [place] [address]    Culture   Final    RARE STAPHYLOCOCCUS AUREUS RARE PASTEURELLA MULTOCIDA Usually susceptible to penicillin  and other beta lactam agents,quinolones,macrolides and tetracyclines. MIXED ANAEROBIC FLORA PRESENT.  CALL LAB IF FURTHER IID REQUIRED.    Report Status PENDING  Incomplete   Organism ID, Bacteria STAPHYLOCOCCUS AUREUS  Final      Susceptibility   Staphylococcus aureus - MIC*    CIPROFLOXACIN <=0.5 SENSITIVE Sensitive     ERYTHROMYCIN <=0.25 SENSITIVE Sensitive     GENTAMICIN <=0.5 SENSITIVE Sensitive     OXACILLIN <=0.25 SENSITIVE Sensitive     TETRACYCLINE <=1 SENSITIVE Sensitive     VANCOMYCIN <=0.5 SENSITIVE Sensitive     TRIMETH/SULFA <=10 SENSITIVE Sensitive     CLINDAMYCIN <=0.25 SENSITIVE Sensitive     RIFAMPIN <=0.5 SENSITIVE Sensitive     Inducible Clindamycin NEGATIVE Sensitive     * RARE STAPHYLOCOCCUS AUREUS  Aerobic/Anaerobic Culture w Gram Stain (surgical/deep wound)     Status: None (Preliminary result)   Collection Time: 11/11/20  3:32 PM    Specimen: Other Source; Tissue  Result Value Ref Range Status   Specimen Description   Final    WOUND 2ND METATARSAL Performed at [facility], [address] [place], [place] [address]    Special Requests   Final    NONE Performed at [facility], [address] [place], [place] [address]    Gram Stain   Final    NO ORGANISMS SEEN SQUAMOUS EPITHELIAL CELLS PRESENT FEW WBC PRESENT, PREDOMINANTLY MONONUCLEAR FEW GRAM POSITIVE COCCI    Culture   Final    RARE STREPTOCOCCUS MITIS/ORALIS RARE PASTEURELLA MULTOCIDA Usually susceptible to penicillin and other beta lactam agents,quinolones,macrolides and tetracyclines. ABUNDANT BACTEROIDES SPECIES BETA LACTAMASE POSITIVE RARE STAPHYLOCOCCUS AUREUS SUSCEPTIBILITIES TO FOLLOW Performed at [facility], [place] [address], [place], [place] [address]    Report Status PENDING  Incomplete   Organism ID, Bacteria STREPTOCOCCUS MITIS/ORALIS  Final      Susceptibility   Streptococcus mitis/oralis - MIC*    TETRACYCLINE 0.5 SENSITIVE Sensitive     VANCOMYCIN 0.5 SENSITIVE Sensitive     CLINDAMYCIN <=0.25 SENSITIVE Sensitive     * RARE STREPTOCOCCUS MITIS/ORALIS  Aerobic/Anaerobic Culture w Gram Stain (surgical/deep wound)     Status: None (Preliminary result)   Collection Time: 11/11/20  4:26 PM   Specimen: Other Source; Tissue  Result Value Ref Range Status   Specimen Description   Final    WOUND LEFT METATARSAL Performed at [facility], [address] [place], [place] [address]    Special Requests   Final    NONE Performed at [facility], [address] [place], [place] [address]    Gram Stain NO WBC SEEN NO ORGANISMS SEEN   Final   Culture   Final    HOLDING FOR POSSIBLE ANAEROBE Performed at [facility], [place] [address], [place], [place] [address]    Report Status PENDING  Incomplete  MRSA Next Gen by PCR, Nasal     Status: None   Collection Time: 11/13/20 11:22 AM    Specimen: Nasal Mucosa; Nasal Swab  Result Value Ref Range Status   MRSA by PCR Next Gen NOT DETECTED NOT DETECTED Final    Comment: (NOTE) The GeneXpert MRSA Assay (FDA approved for NASAL specimens only), is one component of a comprehensive MRSA colonization surveillance program. It is not intended to diagnose MRSA infection nor to guide or monitor treatment for MRSA infections. Test performance is not FDA approved in patients less than 9 years old. Performed at [facility], [address] [place], [place] [address]   CULTURE, BLOOD (ROUTINE X 2) w Reflex to ID Panel     Status: None (Preliminary result)   Collection Time: 11/13/20 12:44 PM   Specimen: BLOOD  Result Value Ref Range Status   Specimen Description BLOOD LEFT ANTECUBITAL  Final   Special Requests   Final    BOTTLES DRAWN AEROBIC AND ANAEROBIC Blood Culture adequate volume   Culture   Final    NO GROWTH 4 DAYS Performed at [facility], [address] [place], [place] [address]    Report Status PENDING  Incomplete  CULTURE, BLOOD (ROUTINE X 2) w Reflex to ID Panel     Status: None (Preliminary result)   Collection Time: 11/13/20 12:52 PM   Specimen: BLOOD  Result Value Ref Range Status   Specimen Description BLOOD BLOOD LEFT HAND  Final   Special Requests   Final    BOTTLES DRAWN AEROBIC ONLY Blood Culture results may not be optimal due to an inadequate volume of blood received in culture bottles   Culture   Final    NO GROWTH 4 DAYS Performed at [facility], [address], [place], [place] [address]    Report Status PENDING  Incomplete  Culture, Respiratory w Gram Stain     Status: None   Collection Time: 11/13/20  3:43 PM   Specimen: Tracheal Aspirate; Respiratory  Result Value Ref Range Status   Specimen Description   Final    TRACHEAL ASPIRATE Performed at [facility], [address], [place], [place] [address]    Special Requests   Final    NONE Performed  at [facility], [place], [place] [address]    Gram Stain   Final    NO SQUAMOUS EPITHELIAL CELLS SEEN FEW WBC SEEN NO ORGANISMS SEEN Performed at [facility], [place] [address], [place], [place] [address]    Culture RARE CANDIDA ALBICANS  Final   Report Status 11/16/2020 FINAL  Final    Coagulation Studies: No results for input(s): LABPROT, INR in the last 72 hours.   Urinalysis: No results for input(s): COLORURINE, LABSPEC, PHURINE, GLUCOSEU, HGBUR, BILIRUBINUR, KETONESUR, PROTEINUR, UROBILINOGEN, NITRITE, LEUKOCYTESUR in the last 72 hours.  Invalid input(s): APPERANCEUR     Imaging: DG Abd 1 View  Result Date: 11/15/2020 CLINICAL DATA:  Evaluate feeding catheter placement EXAM: ABDOMEN - 1 VIEW COMPARISON:  Film from earlier in the same day. FINDINGS: Catheter is been manipulated and now lies in the mid to distal stomach directed towards the pylorus. IMPRESSION: Gastric catheter within the mid to distal stomach directed towards the pylorus. Electronically Signed   By: Inez Catalina M.D.   On: 11/15/2020 22:23   DG Abd 1 View  Result Date: 11/15/2020 CLINICAL DATA:  Evaluate feeding catheter EXAM: ABDOMEN - 1 VIEW COMPARISON:  None. FINDINGS: Scattered large and small bowel gas is noted. Weighted feeding catheter is noted coiled in the mid stomach. No free air is seen. IMPRESSION: Feeding catheter in the mid stomach. Electronically Signed   By: Inez Catalina M.D.   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 EXAM: PORTABLE CHEST 1 VIEW COMPARISON:  11/14/2020 and older studies. FINDINGS: Since the prior exam, the endotracheal tube and the nasal/orogastric tube  have been removed. Cardiac silhouette is normal in size. No mediastinal or hilar masses. Vascular  congestion has improved. Lung base opacities have cleared. Lungs are now clear. No pneumothorax.  Possible small effusions. Skeletal structures are grossly intact. IMPRESSION: 1. Significant improvement. There is residual vascular congestion, but no persistent interstitial or airspace edema. 2. Status post removal of the endotracheal tube and nasal/orogastric tube. Electronically Signed   By: Lajean Manes M.D.   On: 11/15/2020 15:58     Medications:    sodium chloride Stopped (11/11/20 0850)   ampicillin-sulbactam (UNASYN) IV 3 g (11/17/20 0919)   feeding supplement (VITAL 1.5 CAL) 1,000 mL (11/17/20 0327)   heparin     lactated ringers 75 mL/hr at 11/17/20 0700    allopurinol  200 mg Per Tube Daily   vitamin C  500 mg Per Tube BID   budesonide (PULMICORT) nebulizer solution  0.5 mg Nebulization BID   Chlorhexidine Gluconate Cloth  6 each Topical Daily   diltiazem  60 mg Per Tube Q6H   docusate  100 mg Per Tube BID   feeding supplement (NEPRO CARB STEADY)  237 mL Per Tube TID BM   ferrous gluconate  324 mg Oral Q breakfast   free water  30 mL Per Tube Q4H   insulin aspart  0-6 Units Subcutaneous Q4H   ipratropium-albuterol  3 mL Nebulization TID   [START ON 11/18/2020] levothyroxine  100 mcg Per Tube Q0600   mouth rinse  15 mL Mouth Rinse BID   metoprolol tartrate  25 mg Per Tube BID   multivitamin with minerals  1 tablet Per Tube Daily   nutrition supplement (JUVEN)  1 packet Per Tube BID BM   pantoprazole (PROTONIX) IV  40 mg Intravenous Daily   polyethylene glycol  17 g Per Tube Daily   venlafaxine  100 mg Per Tube BID   sodium chloride, [DISCONTINUED] acetaminophen **OR** acetaminophen, acetaminophen, albuterol, diltiazem, meclizine, ondansetron **OR** ondansetron (ZOFRAN) IV  Assessment/ Plan:  Ms. MAXCINE STRONG is a 70 y.o.  female with medical problems of   DM, HTN   was admitted on 11/09/2020 fof Atrial fibrillation with rapid ventricular response ([place]) [I48.91] AKI  (acute kidney injury) ([place]) [N17.9] Osteomyelitis of foot, left, acute ([place]) [C14.481] Atrial fibrillation with RVR ([place]) [I48.91] Worsening renal function [N28.9] Syncope, unspecified syncope type [R55]     Impression   1)Renal   Acute kidney injury Patient has AKI secondary to ATN Patient has AKI secondary to hypotension/patient did have ACE on board as an outpatient Patient has AKI on CKD. Patient has CKD stage IV Patient has CKD secondary to diabetes mellitus Patient has CKD going back to 2019   Patient AKI is better Patient creatinine has come down from 5.2 to 4.2. No acute need for renal replacement therapy today    2)HTN    Blood pressure is stable    3)Anemia of chronic disease  CBC Latest Ref Rng & Units 11/17/2020 11/16/2020 11/15/2020  WBC 4.0 - 10.5 K/uL 9.3 10.8(H) 12.8(H)  Hemoglobin 12.0 - 15.0 g/dL 8.2(L) 8.0(L) 8.3(L)  Hematocrit 36.0 - 46.0 % 26.2(L) 25.2(L) 26.1(L)  Platelets 150 - 400 K/uL 134(L) 168 197     Hemoglobin is stable No need for PRBC today   4) Hyperphosphatemia Patient has renal failure associated hyperphosphatemia We will hold off on starting binders    Lab Results  Component Value Date   CALCIUM 8.5 (L) 11/17/2020   PHOS 4.9 (H) 11/17/2020  5)Osteomyelitis Patient admitted with osteomyelitis of left foot Patient is s/p partial ray amputation done by podiatry on November 11, 2020   6) Electrolytes   BMP Latest Ref Rng & Units 11/17/2020 11/16/2020 11/15/2020  Glucose 70 - 99 mg/dL 175(H) 166(H) 147(H)  BUN 8 - 23 mg/dL 97(H) 99(H) 106(H)  Creatinine 0.44 - 1.00 mg/dL 4.24(H) 4.79(H) 5.23(H)  Sodium 135 - 145 mmol/L 139 139 134(L)  Potassium 3.5 - 5.1 mmol/L 4.1 3.9 3.9  Chloride 98 - 111 mmol/L 102 101 102  CO2 22 - 32 mmol/L 28 25 19(L)  Calcium 8.9 - 10.3 mg/dL 8.5(L) 8.2(L) 8.3(L)     Sodium Normonatremic   Potassium Normokalemic    7)Acid base Acidosis now better Co2 at goal   8)  CVA Patient being followed by neurology. Neurology suspects periprocedural versus atrial fibrillation associated with CVA   9)Acute respiratory failure Patient is currently stable Patient was extubated day before yesterday   Plan   Patient AKI is improving . No need for renal replacement therapy Will follow up    LOS: 8 Bridget Westbrooks s [facility] 11/6/20229:41 AM

## 2020-11-17 NOTE — Progress Notes (Signed)
PT Cancellation Note  Patient Details Name: Autumn [name] MRN: [id number] DOB: [birth year]   Cancelled Treatment:    Reason Eval/Treat Not Completed: Other (comment);Patient at procedure or test/unavailable (Patient consult received and reviewed. Patient undergoing bath upon PT attempt x 2. Will attempt again at later time/date.)   Janna Arch, PT, DPT  11/17/2020, 11:04 AM

## 2020-11-17 NOTE — Progress Notes (Signed)
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 Other comorbidities include sepsis without shock secondary to osteomyelitis and cellulitis left foot (IV zosyn), AKI on CKD, rapid afib (Cardizem + Heparin gtt), NSTEMI suspect demand ischemia, anemia acute on chronic. Transferred to ICU and intubated 11/2 after evolving neuro changes with unresponsiveness after coughing episode. 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    PT Comments    Patient in bed upon PT arrival sleeping. Patient opens eyes and intermittently participates with PT however is severely limited by fatigue. Patient fell asleep multiple times throughout AAROM interventions in supine and was unsafe to transition to EOB this session due to extreme fatigue/limited ability to maintain consciousness. Patient momentarily opens eyes for physician when he entered room but then quickly fell back asleep after he left. Nursing notified of patient fatigue. Current POC remains appropriate.     Recommendations for follow up therapy are one component of a multi-disciplinary discharge planning process, led by the attending physician.  Recommendations may be updated based on patient status, additional functional criteria and insurance authorization.  Follow Up Recommendations  Acute inpatient rehab (3hours/day)     Assistance Recommended at Discharge Frequent or constant Supervision/Assistance  Equipment Recommendations  Other (comment) (defer  to next level of care)    Recommendations for Other Services       Precautions / Restrictions Precautions Precautions: Fall Restrictions Weight Bearing Restrictions: Yes LLE Weight Bearing: Non weight bearing     Mobility  Bed Mobility Overal bed mobility: Needs Assistance             General bed mobility comments: unable to assess this session due to patient falling asleep on PT.    Transfers                   General transfer comment: unsafe at this time due to wt bering restrictions and inconsistency with following commands. Will need +2 for OOB activity.    Ambulation/Gait                 Stairs             Wheelchair Mobility    Modified Rankin (Stroke Patients Only)       Balance       Sitting balance - Comments: unable to assess this session                                    Cognition Arousal/Alertness: Lethargic Behavior During Therapy: Flat affect (intermittent falling asleep)   Area of Impairment: Following commands;Safety/judgement;Awareness                       Following Commands: Follows one step commands inconsistently Safety/Judgement: Decreased awareness of safety;Decreased awareness of deficits     General Comments: Patient just recieved bath prior to session and is very exhausted. Intermittent falling asleep.        Exercises General Exercises - Lower Extremity Ankle  Circles/Pumps: Strengthening;AAROM;Both;10 reps;Supine Heel Slides: Strengthening;AAROM;Both;15 reps;Supine Hip ABduction/ADduction: AAROM;Strengthening;Both;15 reps;Supine Other Exercises Other Exercises: UE reaches to PT hands x 5 each UE.    General Comments        Pertinent Vitals/Pain Pain Assessment: Faces Faces Pain Scale: No hurt Facial Expression: Relaxed, neutral Body Movements: Absence of movements Muscle Tension: Relaxed Pain Intervention(s): Limited activity within patient's tolerance    Home  Living                          Prior Function            PT Goals (current goals can now be found in the care plan section) Acute Rehab PT Goals Patient Stated Goal: none stated Progress towards PT goals: Progressing toward goals    Frequency    7X/week      PT Plan Current plan remains appropriate    Co-evaluation              AM-PAC PT "6 Clicks" Mobility   Outcome Measure  Help needed turning from your back to your side while in a flat bed without using bedrails?: A Lot Help needed moving from lying on your back to sitting on the side of a flat bed without using bedrails?: A Lot Help needed moving to and from a bed to a chair (including a wheelchair)?: Total Help needed standing up from a chair using your arms (e.g., wheelchair or bedside chair)?: Total Help needed to walk in hospital room?: Total Help needed climbing 3-5 steps with a railing? : Total 6 Click Score: 8    End of Session   Activity Tolerance: Patient limited by fatigue Patient left: in bed;with call bell/phone within reach Nurse Communication: Mobility status PT Visit Diagnosis: Other abnormalities of gait and mobility (R26.89);Hemiplegia and hemiparesis;Muscle weakness (generalized) (M62.81);History of falling (Z91.81) Hemiplegia - Right/Left: Right Hemiplegia - dominant/non-dominant: Dominant     Time: 5027-7412 PT Time Calculation (min) (ACUTE ONLY): 12 min  Charges:  $Therapeutic Exercise: 8-22 mins                     Janna Arch, PT, DPT  11/17/2020, 12:26 PM

## 2020-11-17 NOTE — Consult Note (Signed)
ANTICOAGULATION CONSULT NOTE   Pharmacy Consult for Heparin gtt Indication: atrial fibrillation  Patient Measurements: Height: 5\' 6"  (167.6 cm) Weight: 91.8 kg (202 lb 6.1 oz) IBW/kg (Calculated) : 59.3 Heparin Dosing Weight: 77.8 kg  Labs: Recent Labs    11/15/20 0942 11/15/20 0942 11/16/20 0246 11/16/20 1153 11/16/20 2143 11/17/20 0815  HGB 8.3*  --  8.0*  --   --  8.2*  HCT 26.1*  --  25.2*  --   --  26.2*  PLT 197  --  168  --   --  134*  HEPARINUNFRC  --    < > 0.97* >1.10* 0.99* 1.03*  CREATININE 5.23*  --  4.79*  --   --  4.24*   < > = values in this interval not displayed.     Estimated Creatinine Clearance: 14.5 mL/min (A) (by C-G formula based on SCr of 4.24 mg/dL (H)).   Medical History: Past Medical History:  Diagnosis Date   Diabetes mellitus without complication ([place])    Hypertension    Kidney stone    Thyroid disease     Medications:  Scheduled:   allopurinol  200 mg Per Tube Daily   vitamin C  500 mg Per Tube BID   budesonide (PULMICORT) nebulizer solution  0.5 mg Nebulization BID   Chlorhexidine Gluconate Cloth  6 each Topical Daily   diltiazem  60 mg Per Tube Q6H   docusate  100 mg Per Tube BID   feeding supplement (NEPRO CARB STEADY)  237 mL Per Tube TID BM   ferrous gluconate  324 mg Oral Q breakfast   free water  30 mL Per Tube Q4H   insulin aspart  0-6 Units Subcutaneous Q4H   ipratropium-albuterol  3 mL Nebulization TID   [START ON 11/18/2020] levothyroxine  100 mcg Per Tube Q0600   mouth rinse  15 mL Mouth Rinse BID   metoprolol tartrate  25 mg Per Tube BID   multivitamin with minerals  1 tablet Per Tube Daily   nutrition supplement (JUVEN)  1 packet Per Tube BID BM   pantoprazole (PROTONIX) IV  40 mg Intravenous Daily   polyethylene glycol  17 g Per Tube Daily   venlafaxine  100 mg Per Tube BID    Heparin Dosing Weight: 77.8 kg  Assessment: Patient admitted with new onset A/Fib (CHADS-VASc - 6) and worsening renal function.   Antibiotics for osteomyelitis were initiated (s/p toe amputation & I/D of abscess) but post-op c/b unresponsiveness and neurochanges on 11/2 transferred to ICU.  Pt noted to have Patent Foramen Ovale and Afib, but anticoagulation initially held for further w/u of neurologic deficits.    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 There was no mass effect or hemorrhage and pharmacy consulted for heparin drip mgmt to resume once clear by Neurology on 11/4.  Noted history of DM, CKD-stage 4,HTN, thyroid disorder, and liver cirrhosis secondary to NASH  Date Time aPTT/HL Rate/Comment 10/30  0115 0.10  Subtherapeutic  10/30   2051 0.18  Subtherapeutic increase drip rate to 1800 units/hr 10/31  0704 0.43  Therapeutic x1; 1800 un/hr 11/02 0855 0.64  Therapeutic x2; 1800 un/hr 11/05 0246 0.97  Supratherapeutic; 1800 un/hr 11/05 1153 >1.1  Supratherapeutic; 1650 un/hr 11/05 2143 0.99  Supratherapeutic 11/06 0815 1.03  Supratherapeutic; 1250 units/hr  Goal of Therapy:  Heparin level 0.3-0.7 units/ml Monitor platelets by anticoagulation protocol:  Yes   Plan:  Hold heparin infusion x 1 hour Decrease heparin infusion rate to 1000 units/hr Recheck heparin level 8 hr after re-start CBC daily while on heparin  Benita Gutter  11/17/2020 9:32 AM

## 2020-11-17 NOTE — Progress Notes (Signed)
Patients left foot dressing noted with bright red blood. Dressing reinforced and left foot elevated.

## 2020-11-17 NOTE — Progress Notes (Signed)
Progress Note  Patient Name: Autumn [name] Date of Encounter: 11/17/2020  [facility] Cardiologist: Dr. Saunders Revel  Subjective   No acute events overnight, NG tube in place, some bleeding noted on the left foot wound.  H&H stable.  Inpatient Medications    Scheduled Meds:  allopurinol  200 mg Per Tube Daily   vitamin C  500 mg Per Tube BID   budesonide (PULMICORT) nebulizer solution  0.5 mg Nebulization BID   Chlorhexidine Gluconate Cloth  6 each Topical Daily   diltiazem  60 mg Per Tube Q6H   docusate  100 mg Per Tube BID   feeding supplement (NEPRO CARB STEADY)  237 mL Per Tube TID BM   ferrous gluconate  324 mg Oral Q breakfast   free water  30 mL Per Tube Q4H   insulin aspart  0-6 Units Subcutaneous Q4H   ipratropium-albuterol  3 mL Nebulization TID   [START ON 11/18/2020] levothyroxine  100 mcg Per Tube Q0600   mouth rinse  15 mL Mouth Rinse BID   metoprolol tartrate  25 mg Per Tube BID   multivitamin with minerals  1 tablet Per Tube Daily   nutrition supplement (JUVEN)  1 packet Per Tube BID BM   pantoprazole (PROTONIX) IV  40 mg Intravenous Daily   polyethylene glycol  17 g Per Tube Daily   venlafaxine  100 mg Per Tube BID   Continuous Infusions:  sodium chloride Stopped (11/11/20 0850)   ampicillin-sulbactam (UNASYN) IV 3 g (11/17/20 0919)   feeding supplement (VITAL 1.5 CAL) 1,000 mL (11/17/20 0327)   heparin 1,000 Units/hr (11/17/20 1130)   lactated ringers 75 mL/hr at 11/17/20 1128   PRN Meds: sodium chloride, [DISCONTINUED] acetaminophen **OR** acetaminophen, acetaminophen, albuterol, diltiazem, meclizine, ondansetron **OR** ondansetron (ZOFRAN) IV   Vital Signs    Vitals:   11/17/20 1045 11/17/20 1100 11/17/20 1105 11/17/20 1136  BP:  137/86    Pulse: (!) 113 (!) 133 (!) 113 (!) 36  Resp: 16 (!) 26 19 16   Temp:    97.6 F (36.4 C)  TempSrc:    Oral  SpO2: 90% (!) 87% 90% (!) 89%  Weight:      Height:        Intake/Output Summary (Last 24  hours) at 11/17/2020 1256 Last data filed at 11/17/2020 0900 Gross per 24 hour  Intake 3186.41 ml  Output 1500 ml  Net 1686.41 ml   Last 3 Weights 11/17/2020 11/16/2020 11/09/2020  Weight (lbs) 202 lb 6.1 oz 202 lb 2.6 oz 190 lb  Weight (kg) 91.8 kg 91.7 kg 86.183 kg      Telemetry    Atrial fibrillation, heart rate 115- Personally Reviewed  ECG     - Personally Reviewed  Physical Exam   GEN: Somnolent, soft-spoken Neck: No JVD, NG tube noted Cardiac: Irregular irregular Respiratory: Diminished breath sounds at bases GI: Soft, nontender, non-distended  MS: No edema; left foot in dressing Neuro: Unable to assess, generalized weakness, worse on right arm. Psych: Unable to assess  Labs    High Sensitivity Troponin:   Recent Labs  Lab 11/09/20 1220 11/09/20 1818 11/10/20 0115 11/10/20 0649  TROPONINIHS 147* 151* 162* 149*     Chemistry Recent Labs  Lab 11/14/20 0526 11/15/20 0942 11/16/20 0246 11/17/20 0815  NA 133* 134* 139 139  K 4.5 3.9 3.9 4.1  CL 104 102 101 102  CO2 16* 19* 25 28  GLUCOSE 183* 147* 166* 175*  BUN 85* 106*  99* 97*  CREATININE 4.91* 5.23* 4.79* 4.24*  CALCIUM 8.9 8.3* 8.2* 8.5*  MG  --  2.4 2.4 2.1  PROT  --   --  5.6* 5.8*  ALBUMIN 2.2*  --  2.1* 2.3*  AST  --   --  19 19  ALT  --   --  18 18  ALKPHOS  --   --  59 66  BILITOT  --   --  0.9 0.7  GFRNONAA 9* 8* 9* 11*  ANIONGAP 13 13 13 9     Lipids  Recent Labs  Lab 11/14/20 0526  TRIG 213*    Hematology Recent Labs  Lab 11/15/20 0942 11/16/20 0246 11/17/20 0815  WBC 12.8* 10.8* 9.3  RBC 2.97* 2.92* 2.95*  HGB 8.3* 8.0* 8.2*  HCT 26.1* 25.2* 26.2*  MCV 87.9 86.3 88.8  MCH 27.9 27.4 27.8  MCHC 31.8 31.7 31.3  RDW 16.1* 15.9* 15.9*  PLT 197 168 134*   Thyroid  No results for input(s): TSH, FREET4 in the last 168 hours.   BNPNo results for input(s): BNP, PROBNP in the last 168 hours.  DDimer No results for input(s): DDIMER in the last 168 hours.   Radiology     DG Abd 1 View  Result Date: 11/15/2020 CLINICAL DATA:  Evaluate feeding catheter placement EXAM: ABDOMEN - 1 VIEW COMPARISON:  Film from earlier in the same day. FINDINGS: Catheter is been manipulated and now lies in the mid to distal stomach directed towards the pylorus. IMPRESSION: Gastric catheter within the mid to distal stomach directed towards the pylorus. Electronically Signed   By: Inez Catalina M.D.   On: 11/15/2020 22:23   DG Abd 1 View  Result Date: 11/15/2020 CLINICAL DATA:  Evaluate feeding catheter EXAM: ABDOMEN - 1 VIEW COMPARISON:  None. FINDINGS: Scattered large and small bowel gas is noted. Weighted feeding catheter is noted coiled in the mid stomach. No free air is seen. IMPRESSION: Feeding catheter in the mid stomach. Electronically Signed   By: Inez Catalina M.D.   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 EXAM: PORTABLE CHEST 1 VIEW COMPARISON:  11/14/2020 and older studies. FINDINGS: Since the prior exam, the endotracheal tube and the nasal/orogastric tube have been removed. Cardiac silhouette is normal in size. No mediastinal or hilar masses. Vascular congestion has improved. Lung base opacities have cleared. Lungs are now clear. No pneumothorax.  Possible small effusions. Skeletal structures are grossly intact. IMPRESSION: 1. Significant improvement. There is residual vascular congestion, but no persistent interstitial or airspace edema. 2. Status post removal of the endotracheal tube and nasal/orogastric tube. Electronically Signed   By: Lajean Manes M.D.   On: 11/15/2020 15:58    Cardiac Studies   Echo 10/2020 1. Left ventricular ejection fraction, by estimation, is 55 to 60%. The  left ventricle has normal function. The left ventricle has no regional  wall motion  abnormalities. There is mild concentric left ventricular  hypertrophy. Left ventricular diastolic  parameters are indeterminate. Elevated left ventricular end-diastolic  pressure.   2. Right ventricular systolic function is normal. The right ventricular  size is normal. There is normal pulmonary artery systolic pressure.   3. Left atrial size was severely dilated.   4. The mitral valve is normal in structure. Trivial mitral valve  regurgitation. No evidence of mitral stenosis.   5. The aortic valve is tricuspid. Aortic valve regurgitation is not  visualized. No aortic stenosis is present.   6. The inferior vena cava is normal in size with greater than 50%  respiratory variability, suggesting right atrial pressure of 3 mmHg.   7. There is a small patent foramen ovale.   Patient Profile     70 y.o. female with history of HFpEF, CKD 4, cirrhosis, smoker x50+ years, admitted with left foot osteomyelitis, being seen for atrial fibrillation with rapid ventricular response.  Hospital course complicated by respiratory failure and acute stroke.  Assessment & Plan   1.  A. fib RVR -Heart rate reasonable -Continue Cardizem 60 mg every 6, Lopressor 25 mg twice daily -Heparin drip .  H&H stable. -Consolidate Toprol-XL upon discharge, start oral anticoagulation when deemed appropriate as per neurology. -Echo with preserved EF  2.  Acute stroke -Possibly embolic due to A. Fib -Heparin drip restarted -Recommend NOAC upon discharge -Continue PT OT.   3.  Left foot osteomyelitis -Slight bleeding noted -Antibiotics as per primary team -Possible debridement planned next week by podiatry.   Greater than 50% was spent in counseling and coordination of care with patient Total encounter time 35 minutes      Signed, Kate Sable, MD  11/17/2020, 12:56 PM

## 2020-11-17 NOTE — Consult Note (Signed)
ANTICOAGULATION CONSULT NOTE   Pharmacy Consult for Heparin gtt Indication: atrial fibrillation  Patient Measurements: Height: 5\' 6"  (167.6 cm) Weight: 91.7 kg (202 lb 2.6 oz) IBW/kg (Calculated) : 59.3 Heparin Dosing Weight: 77.8 kg  Labs: Recent Labs    11/14/20 0526 11/15/20 0942 11/16/20 0246 11/16/20 1153 11/16/20 2143  HGB 8.5* 8.3* 8.0*  --   --   HCT 26.7* 26.1* 25.2*  --   --   PLT 195 197 168  --   --   HEPARINUNFRC  --   --  0.97* >1.10* 0.99*  CREATININE 4.91* 5.23* 4.79*  --   --      Estimated Creatinine Clearance: 12.8 mL/min (A) (by C-G formula based on SCr of 4.79 mg/dL (H)).   Medical History: Past Medical History:  Diagnosis Date   Diabetes mellitus without complication ([place])    Hypertension    Kidney stone    Thyroid disease     Medications:  Scheduled:   acetaminophen  650 mg Oral Once   allopurinol  200 mg Oral Daily   vitamin C  500 mg Oral BID   budesonide (PULMICORT) nebulizer solution  0.5 mg Nebulization BID   Chlorhexidine Gluconate Cloth  6 each Topical Daily   diltiazem  60 mg Per Tube Q6H   docusate  100 mg Per Tube BID   feeding supplement (NEPRO CARB STEADY)  237 mL Oral TID BM   ferrous gluconate  324 mg Oral Q breakfast   free water  30 mL Per Tube Q4H   insulin aspart  0-6 Units Subcutaneous Q4H   ipratropium-albuterol  3 mL Nebulization TID   levothyroxine  100 mcg Oral Q0600   mouth rinse  15 mL Mouth Rinse BID   metoprolol tartrate  25 mg Oral BID   multivitamin with minerals  1 tablet Oral Daily   nutrition supplement (JUVEN)  1 packet Per Tube BID BM   pantoprazole (PROTONIX) IV  40 mg Intravenous Daily   polyethylene glycol  17 g Per Tube Daily   venlafaxine  100 mg Oral BID    Heparin Dosing Weight: 77.8 kg  Assessment: Patient admitted with new onset A/Fib (CHADS-VASc - 6) and worsening renal function.  Antibiotics for osteomyelitis were initiated (s/p toe amputation & I/D of abscess) but post-op c/b  unresponsiveness and neurochanges on 11/2 transferred to ICU.  Pt noted to have Patent Foramen Ovale and Afib, but anticoagulation initially held for further w/u of neurologic deficits.    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 There was no mass effect or hemorrhage and pharmacy consulted for heparin drip mgmt to resume once clear by Neurology on 11/4.  Noted history of DM, CKD-stage 4,HTN, thyroid disorder, and liver cirrhosis secondary to NASH  Date Time aPTT/HL Rate/Comment 10/30  0115 0.10  Subtherapeutic  10/30   2051 0.18  Subtherapeutic increase drip rate to 1800 units/hr 10/31  0704 0.43  Therapeutic x1; 1800 un/hr 11/02 0855 0.64  Therapeutic x2; 1800 un/hr 11/05 0246 0.97  Supratherapeutic; 1800 un/hr 11/05 1153 >1.1  Supratherapeutic; 1650 un/hr 11/05 2143 0.99  Supratherapeutic  Goal of Therapy:  Heparin level 0.3-0.7 units/ml Monitor platelets by anticoagulation protocol: Yes   Plan:  Decrease heparin infusion rate to 1250 units/hr Recheck heparin level in 8 hr after rate change CBC daily while on heparin  Trudie Cervantes,Natha S  11/17/2020 12:37 AM

## 2020-11-17 NOTE — Consult Note (Signed)
ANTICOAGULATION CONSULT NOTE   Pharmacy Consult for Heparin gtt Indication: atrial fibrillation  Patient Measurements: Height: 5\' 6"  (167.6 cm) Weight: 91.8 kg (202 lb 6.1 oz) IBW/kg (Calculated) : 59.3 Heparin Dosing Weight: 77.8 kg  Labs: Recent Labs    11/15/20 0942 11/16/20 0246 11/16/20 1153 11/16/20 2143 11/17/20 0815 11/17/20 1854  HGB 8.3* 8.0*  --   --  8.2*  --   HCT 26.1* 25.2*  --   --  26.2*  --   PLT 197 168  --   --  134*  --   HEPARINUNFRC  --  0.97*   < > 0.99* 1.03* 0.50  CREATININE 5.23* 4.79*  --   --  4.24*  --    < > = values in this interval not displayed.     Estimated Creatinine Clearance: 14.5 mL/min (A) (by C-G formula based on SCr of 4.24 mg/dL (H)).   Medical History: Past Medical History:  Diagnosis Date   Diabetes mellitus without complication ([place])    Hypertension    Kidney stone    Thyroid disease     Medications:  Scheduled:   allopurinol  200 mg Per Tube Daily   vitamin C  500 mg Per Tube BID   budesonide (PULMICORT) nebulizer solution  0.5 mg Nebulization BID   Chlorhexidine Gluconate Cloth  6 each Topical Daily   diltiazem  60 mg Per Tube Q6H   docusate  100 mg Per Tube BID   feeding supplement (NEPRO CARB STEADY)  237 mL Per Tube TID BM   ferrous gluconate  324 mg Oral Q breakfast   free water  30 mL Per Tube Q4H   insulin aspart  0-6 Units Subcutaneous Q4H   ipratropium-albuterol  3 mL Nebulization TID   [START ON 11/18/2020] levothyroxine  100 mcg Per Tube Q0600   mouth rinse  15 mL Mouth Rinse BID   metoprolol tartrate  25 mg Per Tube BID   multivitamin with minerals  1 tablet Per Tube Daily   nutrition supplement (JUVEN)  1 packet Per Tube BID BM   pantoprazole (PROTONIX) IV  40 mg Intravenous Daily   polyethylene glycol  17 g Per Tube Daily   venlafaxine  100 mg Per Tube BID    Heparin Dosing Weight: 77.8 kg  Assessment: Patient admitted with new onset A/Fib (CHADS-VASc - 6) and worsening renal function.   Antibiotics for osteomyelitis were initiated (s/p toe amputation & I/D of abscess) but post-op c/b unresponsiveness and neurochanges on 11/2 transferred to ICU.  Pt noted to have Patent Foramen Ovale and Afib, but anticoagulation initially held for further w/u of neurologic deficits.    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 There was no mass effect or hemorrhage and pharmacy consulted for heparin drip mgmt to resume once clear by Neurology on 11/4.  Noted history of DM, CKD-stage 4,HTN, thyroid disorder, and liver cirrhosis secondary to NASH  Date Time aPTT/HL Rate/Comment 10/30  0115 0.10  Subtherapeutic  10/30   2051 0.18  Subtherapeutic increase drip rate to 1800 units/hr 10/31  0704 0.43  Therapeutic x1; 1800 un/hr 11/02 0855 0.64  Therapeutic x2; 1800 un/hr 11/05 0246 0.97  Supratherapeutic; 1800 un/hr 11/05 1153 >1.1  Supratherapeutic; 1650 un/hr 11/05 2143 0.99  Supratherapeutic 11/06 0815 1.03  Supratherapeutic; 1250 units/hr 11/06 1854 0.50  Therapeutic x1; 1000 units/hr  Goal of Therapy:  Heparin  level 0.3-0.7 units/ml Monitor platelets by anticoagulation protocol: Yes   Plan:  HL therapeutic, continue heparin infusion rate to 1000 units/hr Recheck heparin level in 8 hr to confirm CBC daily while on heparin  Autumn [name] Autumn [name]  11/17/2020 7:12 PM

## 2020-11-17 NOTE — Progress Notes (Addendum)
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  She'd been following with Olene Godfrey [facility] podiatrist outpatient and had recently refused surgery.  She'd recently been treated with antibiotics at [facility] for the diabetic foot infection with enterobacter aerogans culture positive.  She was admitted on 10/29 due to falls at home with worsening L foot chronic diabetic ulcer with osteomyelitis of the 2nd and 3rd metatarsals and cellulitis noted on MRI.  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  Her post operative course was complicated by unresponsiveness and neurologic changes, she was found to have Willia Genrich stroke and was intubated for airway protection.  She was extubated 11/3 and transferred to [facility] service on 11/4.  See below for additional details 10/29: presented to [facility] ER s/p multiple falls, 5-weeks duration left foot pain, swelling, redness, hit head x2 during fall. ER workup: EKG showed rapid afib, CT head unremarkable, [place] Left LE no DVT, CXR unremarkable, XR L Foot concerning for osteomyelitis 2nd + 3rd metatarsals with soft tissue swelling of foot and ankle. Also with elevated BNP, elevated troponin, rapid Afib, concern for early sepsis, elevated BUN/cr acute renal failure on CKD (baseline Cr 3.19).  10/29: Admitted to floor, started on cardizem infusion for rate control,continuous heparin infusion for anticoagulation in setting of afib. IV Cefepime, vancomycin, flagyl initiated. Podiatry, cardiology, nephrology consulted. MRI left foot: septic arthritis of the second MTP joint and osteomyelitis of the second metatarsal and second proximal phalanx also with concern for an abscess and  cellulitis 10/31: Surgery completed: Left foot Amputation 2nd metatarsal and toe, I&D deep abscess multiple fascial planes, bone biopsy open deep third metatarsal. Note: Purulence found tracking up along the extensor tendons 11/1: ID consulted for infection mgmt; antibiotics adjusted to zosyn 11/2: Unable to complete MRI foot due to agitation despite IV ativan administration. Developed new confusion per RN which progressively worsened over the morning. Became unresponsive after coughing episode during pill swallowing; rapid response paged, stroke alert paged.  11/2: Transferred to ICU rm 10 after CT Head per stroke alert. Patient awake, attempting to follow simple commands however unable to lift arms on command. Tracking, responds to voice; however speech is garbled and unintelligible. When asked if in pain, patient nods and speaks but garbled. + oral secretions, concern for airway protection and patient was emergently intubated.  Notified patient's contact person [name], who arrived post intubation.  11/2: MRI Brain showed acute to early subacute infarction in the left posterior frontal cortical and subcortical brain, possibly affecting the precentral gyrus. There was no mass effect or hemorrhage. EEG with moderate diffuse slowing and occasional brief periods of intervening suppression, both indicative of cerebral dysfunction, medication effect, or both. No electrographic seizures or epileptiform discharges seen.  11/3: Remains MV, on propofol and fentanyl gtts. Turns head to voice, following limited commands.  11/3 extubated 11/4 TRH taking over  Assessment & Plan:   Principal Problem:   Sepsis ([place]) Active Problems:   Atrial fibrillation with RVR ([place])   Osteomyelitis of foot, left, acute ([place])   CKD stage 4 due to type 2 diabetes mellitus ([place])   Liver cirrhosis secondary to  NASH (nonalcoholic steatohepatitis) (H[facility])   Hypertension   History of anemia due to CKD   Frequent falls   AKI  (acute kidney injury) ([place])   Cellulitis and abscess of foot, except toes   Infectious tenosynovitis   [place]   Atrial fibrillation with rapid ventricular response [facility])  Stroke Neurology suspects periprocedural vs related to atrial fibrillation MRI brian with acute to early subacute infarction in the L posterior frontal cortical and subcortical brain, possibly affecting the precentral gyrus.  No mass effect or hemorrhage.  Chronic small vessel ischemic changes, some with chronic punctate hemosiderin deposition. Echo with EF 55-60%, dilated LA, patent foramen ovale She'll need completion of workup eventually with vascular imaging of head and neck, will defer timing to neurology (per neurology, once she's more stable) PT/OT/SLP Per neurology, benefits of resuming heparin likely outweigh risks of hemorrhagic conversion or hypertensive hemorrhage - will plan to resume heparin gtt  11/4 Discussed with Dr. Cheral Marker regarding when we could consider OR with podiatry.  Noted stroke may have been perioperative and debridement with anesthesia may carry increased risk of stroke.  Recommended waiting at least 1 week after stroke and discussing risks/benefits with pt/family.   Acute Metabolic Encephalopathy Related to acute infection and above Delirium precautions Follow and w/u further as indicated  Septic Shock Diabetic Foot Infection Left Foot Osteomyelitis and Cellulitis  Septic Arthritis MRI foot with septic arthritis of second MTP joint and osteomyelitis of 2nd metatarsal head and second proximal phalanx, severe soft tissue swelling around second metatarsal neck concerning for phlegmon developing abscess measuring 2.1x1 cm circumferentially around distal shaft.  Mild early osteomyelitis of 3rd metatarsal head.  Severe soft tissue edema of the forefoot c/w cellulitis. MRI ankle limited -> interval resection of 2nd metatarsal, longitudinal tear of peroneus brevis with peroneus tenosynovitis, distal  tibialis posterior tenosynovitis and tendinopathy, extensor digitorum tenosynovitis.  Extensive subcutaneous edema circumferentially in distal calf.  Dorsal subcutaneous edema in foot. S/p 2nd ray amputation, I&D, and bone bx of 3rd metatarsal on 11/1 Blood cx 11/2 NGx2 Surgical cx with pasteurella multocida and rare strep mitis/oralis as well as MSSA Transitioned to unasyn per ID, appreciate assistance Podiatry following, needs to return to OR at some point for further I&D +/- amputation, but holding off with recent stroke - will discuss with neurology to clarify timing  NWB LLE  Acute Hypoxic Respiratory Failure Aspiration Pneumonia CXR 11/3 with right lung opacity concerning for superimposed infection - also with findings concerning for pulmonary edema and small right effusion CXR 11/4 with improvement, vascular congestion, no persistent interstitial or airspace edema Already on unasyn Resp cx with rare yeast, will follow final cx -> rare candida albicans  Atrial Fibrillation with RVR Appreciate cardiology recommendations Continue heparin gtt Continue  metoprolol and diltiazem PO/per tube  Dysphagia 2/2 stroke SLP eval NG placed and tube feeds started.  Dysphagia 1 diet.  AKI on CKD IV Baseline creatinine around 3.2 Improved today, continue to monitor Avoid nephrotoxins Appreciate renal assistance  Elevated Troponin Cardiology following, thought 2/2 demand  Hx NASH cirrhosis Noted  Hypothyroidism Synthroid  T2DM SSI, follow A1c 6.3  Anemia Stable Labs with iron def Follow B12 elevated, folate wnl  DVT prophylaxis: SCD Code Status:full  Family Communication: none at bedside - called friend in chart 11/4 Disposition:   Status is: Inpatient  Remains inpatient appropriate because: continued need for IV therapies, consultant care       Consultants:  PCCM Nephrology Neurology ID Podiatry cardiology  Procedures:  EEG Impression and clinical  correlation: This EEG was obtained while sedated and asleep and is abnormal due to moderate diffuse slowing and occasional brief periods of intervening suppression both indicative of cerebral dysfunction, medication effect, or both. Echo IMPRESSIONS     1. Left ventricular ejection fraction, by estimation, is 55 to 60%. The  left ventricle has normal function. The left ventricle has no regional  wall motion abnormalities. There is mild concentric left ventricular  hypertrophy. Left ventricular diastolic  parameters are indeterminate. Elevated left ventricular end-diastolic  pressure.   2. Right ventricular systolic function is normal. The right ventricular  size is normal. There is normal pulmonary artery systolic pressure.   3. Left atrial size was severely dilated.   4. The mitral valve is normal in structure. Trivial mitral valve  regurgitation. No evidence of mitral stenosis.   5. The aortic valve is tricuspid. Aortic valve regurgitation is not  visualized. No aortic stenosis is present.   6. The inferior vena cava is normal in size with greater than 50%  respiratory variability, suggesting right atrial pressure of 3 mmHg.   7. There is Shauntae Reitman small patent foramen ovale.  Procedures:             1) amputation ray second metatarsal and toe             2) incision and drainage deep abscess multiple fascial planes             3) bone biopsy open deep third metatarsal Antimicrobials:  Anti-infectives (From admission, onward)    Start     Dose/Rate Route Frequency Ordered Stop   11/14/20 2200  Ampicillin-Sulbactam (UNASYN) 3 g in sodium chloride 0.9 % 100 mL IVPB        3 g 200 mL/hr over 30 Minutes Intravenous Every 12 hours 11/14/20 1610     11/14/20 0900  piperacillin-tazobactam (ZOSYN) IVPB 2.25 g  Status:  Discontinued        2.25 g 100 mL/hr over 30 Minutes Intravenous Every 8 hours 11/14/20 0805 11/14/20 1609   11/13/20 2200  piperacillin-tazobactam (ZOSYN) IVPB 3.375 g  Status:   Discontinued        3.375 g 12.5 mL/hr over 240 Minutes Intravenous Every 12 hours 11/13/20 0758 11/14/20 0805   11/13/20 0600  piperacillin-tazobactam (ZOSYN) IVPB 2.25 g        2.25 g 100 mL/hr over 30 Minutes Intravenous Every 8 hours 11/12/20 2155 11/13/20 1725   11/11/20 1600  vancomycin (VANCOCIN) IVPB 1000 mg/200 mL premix  Status:  Discontinued        1,000 mg 200 mL/hr over 60 Minutes Intravenous Every 48 hours 11/09/20 1618 11/10/20 1045   11/11/20 1558  vancomycin (VANCOCIN) powder  Status:  Discontinued          As needed 11/11/20 1558 11/11/20 1558   11/10/20 2000  vancomycin (VANCOCIN) IVPB 1000 mg/200 mL premix  Status:  Discontinued        1,000 mg 200 mL/hr over 60 Minutes Intravenous Every 48 hours 11/10/20 1918 11/12/20 0940   11/09/20 1800  ceFEPIme (MAXIPIME) 2 g in sodium chloride 0.9 % 100 mL IVPB  Status:  Discontinued        2 g 200 mL/hr over 30 Minutes Intravenous Every 24 hours 11/09/20 1619 11/12/20 2146   11/09/20 1619  vancomycin variable dose per unstable renal function (pharmacist dosing)  Status:  Discontinued         Does not apply See  admin instructions 11/09/20 1619 11/12/20 1521   11/09/20 1615  vancomycin (VANCOREADY) IVPB 1750 mg/350 mL        1,750 mg 175 mL/hr over 120 Minutes Intravenous  Once 11/09/20 1606 11/09/20 1947   11/09/20 1600  metroNIDAZOLE (FLAGYL) IVPB 500 mg  Status:  Discontinued        500 mg 100 mL/hr over 60 Minutes Intravenous Every 8 hours 11/09/20 1555 11/12/20 2146       Subjective: Able to understand some of what she says But difficult due to aphasia  Objective: Vitals:   11/17/20 0517 11/17/20 0600 11/17/20 0700 11/17/20 0824  BP: (!) 135/93 140/81 (!) 134/93   Pulse:  75 95 92  Resp:  17 16 17   Temp:    97.7 F (36.5 C)  TempSrc:    Axillary  SpO2:  95% 98% 96%  Weight:      Height:        Intake/Output Summary (Last 24 hours) at 11/17/2020 1048 Last data filed at 11/17/2020 0700 Gross per 24 hour   Intake 3136.41 ml  Output 1500 ml  Net 1636.41 ml   Filed Weights   11/09/20 1214 11/16/20 0330 11/17/20 0500  Weight: 86.2 kg 91.7 kg 91.8 kg    Examination:  General: No acute distress. Cardiovascular: irregularly irregular, 110's Lungs: unlabored, satting in low 90's on RA Abdomen: Soft, nontender, nondistended Neurological: expressive aphasia, able to understand some things she says today.  RUE weakness Skin: Warm and dry. No rashes or lesions. Extremities: LLE dressing with bloody drainage today     Data Reviewed: I have personally reviewed following labs and imaging studies  CBC: Recent Labs  Lab 11/13/20 0203 11/14/20 0526 11/15/20 0942 11/16/20 0246 11/17/20 0815  WBC 15.9* 14.5* 12.8* 10.8* 9.3  NEUTROABS  --   --   --  8.5* 6.9  HGB 8.5* 8.5* 8.3* 8.0* 8.2*  HCT 26.7* 26.7* 26.1* 25.2* 26.2*  MCV 85.3 87.0 87.9 86.3 88.8  PLT 220 195 197 168 134*    Basic Metabolic Panel: Recent Labs  Lab 11/13/20 0203 11/14/20 0526 11/15/20 0942 11/16/20 0246 11/17/20 0815  NA 132* 133* 134* 139 139  K 4.1 4.5 3.9 3.9 4.1  CL 104 104 102 101 102  CO2 18* 16* 19* 25 28  GLUCOSE 117* 183* 147* 166* 175*  BUN 87* 85* 106* 99* 97*  CREATININE 4.13* 4.91* 5.23* 4.79* 4.24*  CALCIUM 8.5* 8.9 8.3* 8.2* 8.5*  MG  --   --  2.4 2.4 2.1  PHOS  --  7.8*  --  5.9* 4.9*    GFR: Estimated Creatinine Clearance: 14.5 mL/min (Amada Hallisey) (by C-G formula based on SCr of 4.24 mg/dL (H)).  Liver Function Tests: Recent Labs  Lab 11/14/20 0526 11/16/20 0246 11/17/20 0815  AST  --  19 19  ALT  --  18 18  ALKPHOS  --  59 66  BILITOT  --  0.9 0.7  PROT  --  5.6* 5.8*  ALBUMIN 2.2* 2.1* 2.3*    CBG: Recent Labs  Lab 11/16/20 1605 11/16/20 1949 11/16/20 2353 11/17/20 0401 11/17/20 0728  GLUCAP 157* 152* 207* 162* 148*     Recent Results (from the past 240 hour(s))  Resp Panel by RT-PCR (Flu Jaisean Monteforte&B, Covid) Nasopharyngeal Swab     Status: None   Collection Time: 11/09/20   6:25 PM   Specimen: Nasopharyngeal Swab; Nasopharyngeal(NP) swabs in vial transport medium  Result Value Ref Range Status   SARS  Coronavirus 2 by RT PCR NEGATIVE NEGATIVE Final    Comment: (NOTE) SARS-CoV-2 target nucleic acids are NOT DETECTED.  The SARS-CoV-2 RNA is generally detectable in upper respiratory specimens during the acute phase of infection. The lowest concentration of SARS-CoV-2 viral copies this assay can detect is 138 copies/mL. Britten Seyfried negative result does not preclude SARS-Cov-2 infection and should not be used as the sole basis for treatment or other patient management decisions. Jahaad Penado negative result may occur with  improper specimen collection/handling, submission of specimen other than nasopharyngeal swab, presence of viral mutation(s) within the areas targeted by this assay, and inadequate number of viral copies(<138 copies/mL). Rogerio Boutelle negative result must be combined with clinical observations, patient history, and epidemiological information. The expected result is Negative.  Fact Sheet for Patients:  EntrepreneurPulse.com.au  Fact Sheet for Healthcare Providers:  IncredibleEmployment.be  This test is no t yet approved or cleared by the [place] FDA and  has been authorized for detection and/or diagnosis of SARS-CoV-2 by FDA under an Emergency Use Authorization (EUA). This EUA will remain  in effect (meaning this test can be used) for the duration of the COVID-19 declaration under Section 564(b)(1) of the Act, 21 U.S.C.section 360bbb-3(b)(1), unless the authorization is terminated  or revoked sooner.       Influenza Alaia Lordi by PCR NEGATIVE NEGATIVE Final   Influenza B by PCR NEGATIVE NEGATIVE Final    Comment: (NOTE) The Xpert Xpress SARS-CoV-2/FLU/RSV plus assay is intended as an aid in the diagnosis of influenza from Nasopharyngeal swab specimens and should not be used as Betzaida Cremeens sole basis for treatment. Nasal washings and aspirates  are unacceptable for Xpert Xpress SARS-CoV-2/FLU/RSV testing.  Fact Sheet for Patients: EntrepreneurPulse.com.au  Fact Sheet for Healthcare Providers: IncredibleEmployment.be  This test is not yet approved or cleared by the [place] FDA and has been authorized for detection and/or diagnosis of SARS-CoV-2 by FDA under an Emergency Use Authorization (EUA). This EUA will remain in effect (meaning this test can be used) for the duration of the COVID-19 declaration under Section 564(b)(1) of the Act, 21 U.S.C. section 360bbb-3(b)(1), unless the authorization is terminated or revoked.  Performed at [facility], [address] [place], [place] [address]   MRSA Next Gen by PCR, Nasal     Status: None   Collection Time: 11/10/20  1:14 AM   Specimen: Nasal Mucosa; Nasal Swab  Result Value Ref Range Status   MRSA by PCR Next Gen NOT DETECTED NOT DETECTED Final    Comment: (NOTE) The GeneXpert MRSA Assay (FDA approved for NASAL specimens only), is one component of [place] comprehensive MRSA colonization surveillance program. It is not intended to diagnose MRSA infection nor to guide or monitor treatment for MRSA infections. Test performance is not FDA approved in patients less than 55 years old. Performed at [facility], [address] [place], [place] [address]   Aerobic/Anaerobic Culture w Gram Stain (surgical/deep wound)     Status: None (Preliminary result)   Collection Time: 11/11/20  3:23 PM   Specimen: Foot, Left; Abscess  Result Value Ref Range Status   Specimen Description   Final    WOUND LEFT FOOT ABSCESS Performed at [facility], [address], [place], [place] [address]    Special Requests   Final    NONE Performed at [facility], [place], [place] [address]    Gram Stain   Final    NO ORGANISMS SEEN SQUAMOUS EPITHELIAL CELLS PRESENT MODERATE  WBC PRESENT, PREDOMINANTLY  MONONUCLEAR MODERATE GRAM POSITIVE COCCI Performed at [facility], [place] [address], [place], [place] [address]    Culture   Final    RARE STAPHYLOCOCCUS AUREUS RARE PASTEURELLA MULTOCIDA Usually susceptible to penicillin and other beta lactam agents,quinolones,macrolides and tetracyclines. MIXED ANAEROBIC FLORA PRESENT.  CALL LAB IF FURTHER IID REQUIRED.    Report Status PENDING  Incomplete   Organism ID, Bacteria STAPHYLOCOCCUS AUREUS  Final      Susceptibility   Staphylococcus aureus - MIC*    CIPROFLOXACIN <=0.5 SENSITIVE Sensitive     ERYTHROMYCIN <=0.25 SENSITIVE Sensitive     GENTAMICIN <=0.5 SENSITIVE Sensitive     OXACILLIN <=0.25 SENSITIVE Sensitive     TETRACYCLINE <=1 SENSITIVE Sensitive     VANCOMYCIN <=0.5 SENSITIVE Sensitive     TRIMETH/SULFA <=10 SENSITIVE Sensitive     CLINDAMYCIN <=0.25 SENSITIVE Sensitive     RIFAMPIN <=0.5 SENSITIVE Sensitive     Inducible Clindamycin NEGATIVE Sensitive     * RARE STAPHYLOCOCCUS AUREUS  Aerobic/Anaerobic Culture w Gram Stain (surgical/deep wound)     Status: None (Preliminary result)   Collection Time: 11/11/20  3:32 PM   Specimen: Other Source; Tissue  Result Value Ref Range Status   Specimen Description   Final    WOUND 2ND METATARSAL Performed at [facility], [address] [place], [place] [address]    Special Requests   Final    NONE Performed at [facility], [address] [place], [place] [address]    Gram Stain   Final    NO ORGANISMS SEEN SQUAMOUS EPITHELIAL CELLS PRESENT FEW WBC PRESENT, PREDOMINANTLY MONONUCLEAR FEW GRAM POSITIVE COCCI    Culture   Final    RARE STREPTOCOCCUS MITIS/ORALIS RARE PASTEURELLA MULTOCIDA Usually susceptible to penicillin and other beta lactam agents,quinolones,macrolides and tetracyclines. ABUNDANT BACTEROIDES SPECIES BETA LACTAMASE POSITIVE RARE STAPHYLOCOCCUS AUREUS SUSCEPTIBILITIES TO FOLLOW Performed at [facility], [place] [address] [place], [place] [address]    Report Status PENDING  Incomplete   Organism ID, Bacteria STREPTOCOCCUS MITIS/ORALIS  Final      Susceptibility   Streptococcus mitis/oralis - MIC*    TETRACYCLINE 0.5 SENSITIVE Sensitive     VANCOMYCIN 0.5 SENSITIVE Sensitive     CLINDAMYCIN <=0.25 SENSITIVE Sensitive     * RARE STREPTOCOCCUS MITIS/ORALIS  Aerobic/Anaerobic Culture w Gram Stain (surgical/deep wound)     Status: None (Preliminary result)   Collection Time: 11/11/20  4:26 PM   Specimen: Other Source; Tissue  Result Value Ref Range Status   Specimen Description   Final    WOUND LEFT METATARSAL Performed at [facility], [address] [place], [place] [address]    Special Requests   Final    NONE Performed at [facility], [address] [place],  [address]    Gram Stain NO WBC SEEN NO ORGANISMS SEEN   Final   Culture   Final    HOLDING FOR POSSIBLE ANAEROBE Performed at [facility], [place] [address] [place],  [address]    Report Status PENDING  Incomplete  MRSA Next Gen by PCR, Nasal     Status: None   Collection Time: 11/13/20 11:22 AM   Specimen: Nasal Mucosa; Nasal Swab  Result Value Ref Range Status   MRSA by PCR Next Gen NOT DETECTED NOT DETECTED Final    Comment: (NOTE) The GeneXpert MRSA Assay (FDA approved for NASAL specimens only), is one component of [place] comprehensive  MRSA colonization surveillance program. It is not intended to diagnose MRSA infection nor to guide or monitor treatment for MRSA infections. Test performance is not FDA approved in patients less than 12 years old. Performed at [facility], [address] [place], [place] [address]   CULTURE, BLOOD (ROUTINE X 2) w Reflex to ID Panel     Status: None (Preliminary result)   Collection Time: 11/13/20 12:44 PM   Specimen: BLOOD  Result Value Ref Range Status   Specimen Description BLOOD LEFT ANTECUBITAL  Final   Special Requests   Final    BOTTLES DRAWN  AEROBIC AND ANAEROBIC Blood Culture adequate volume   Culture   Final    NO GROWTH 4 DAYS Performed at [facility], [address] [place], [place] [address]    Report Status PENDING  Incomplete  CULTURE, BLOOD (ROUTINE X 2) w Reflex to ID Panel     Status: None (Preliminary result)   Collection Time: 11/13/20 12:52 PM   Specimen: BLOOD  Result Value Ref Range Status   Specimen Description BLOOD BLOOD LEFT HAND  Final   Special Requests   Final    BOTTLES DRAWN AEROBIC ONLY Blood Culture results may not be optimal due to an inadequate volume of blood received in culture bottles   Culture   Final    NO GROWTH 4 DAYS Performed at [facility] [address] [place], [place] [address]    Report Status PENDING  Incomplete  Culture, Respiratory w Gram Stain     Status: None   Collection Time: 11/13/20  3:43 PM   Specimen: Tracheal Aspirate; Respiratory  Result Value Ref Range Status   Specimen Description   Final    TRACHEAL ASPIRATE Performed at [facility], [address] [place], [place] [address]    Special Requests   Final    NONE Performed at [facility], [place], [place] [address]    Gram Stain   Final    NO SQUAMOUS EPITHELIAL CELLS SEEN FEW WBC SEEN NO ORGANISMS SEEN Performed at [facility], [place] [address] [place], [place] [address]    Culture RARE CANDIDA ALBICANS  Final   Report Status 11/16/2020 FINAL  Final         Radiology Studies: DG Abd 1 View  Result Date: 11/15/2020 CLINICAL DATA:  Evaluate feeding catheter placement EXAM: ABDOMEN - 1 VIEW COMPARISON:  Film from earlier in the same day. FINDINGS: Catheter is been manipulated and now lies in the mid to distal stomach directed towards the pylorus. IMPRESSION: Gastric catheter within the mid to distal stomach directed towards the pylorus. Electronically Signed   By: Inez Catalina M.D.   On: 11/15/2020 22:23   DG Abd 1 View  Result  Date: 11/15/2020 CLINICAL DATA:  Evaluate feeding catheter EXAM: ABDOMEN - 1 VIEW COMPARISON:  None. FINDINGS: Scattered large and small bowel gas is noted. Weighted feeding catheter is noted coiled in the mid stomach. No free air is seen. IMPRESSION: Feeding catheter in the mid stomach. Electronically Signed   By: Inez Catalina M.D.   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 EXAM: PORTABLE CHEST 1 VIEW COMPARISON:  11/14/2020 and older studies. FINDINGS: Since the prior exam, the endotracheal tube  and the nasal/orogastric tube have been removed. Cardiac silhouette is normal in size. No mediastinal or hilar masses. Vascular congestion has improved. Lung base opacities have cleared. Lungs are now clear. No pneumothorax.  Possible small effusions. Skeletal structures are grossly intact. IMPRESSION: 1. Significant improvement. There is residual vascular congestion, but no persistent interstitial or airspace edema. 2. Status post removal of the endotracheal tube and nasal/orogastric tube. Electronically Signed   By: Lajean Manes M.D.   On: 11/15/2020 15:58        Scheduled Meds:  allopurinol  200 mg Per Tube Daily   vitamin C  500 mg Per Tube BID   budesonide (PULMICORT) nebulizer solution  0.5 mg Nebulization BID   Chlorhexidine Gluconate Cloth  6 each Topical Daily   diltiazem  60 mg Per Tube Q6H   docusate  100 mg Per Tube BID   feeding supplement (NEPRO CARB STEADY)  237 mL Per Tube TID BM   ferrous gluconate  324 mg Oral Q breakfast   free water  30 mL Per Tube Q4H   insulin aspart  0-6 Units Subcutaneous Q4H   ipratropium-albuterol  3 mL Nebulization TID   [START ON 11/18/2020] levothyroxine  100 mcg Per Tube Q0600   mouth rinse  15 mL Mouth Rinse BID   metoprolol tartrate  25 mg Per Tube BID    multivitamin with minerals  1 tablet Per Tube Daily   nutrition supplement (JUVEN)  1 packet Per Tube BID BM   pantoprazole (PROTONIX) IV  40 mg Intravenous Daily   polyethylene glycol  17 g Per Tube Daily   venlafaxine  100 mg Per Tube BID   Continuous Infusions:  sodium chloride Stopped (11/11/20 0850)   ampicillin-sulbactam (UNASYN) IV 3 g (11/17/20 0919)   feeding supplement (VITAL 1.5 CAL) 1,000 mL (11/17/20 0327)   heparin     lactated ringers 75 mL/hr at 11/17/20 0700     LOS: 8 days    Time spent: over 30 min    Fayrene Helper, MD [facility]   To contact the attending provider between 7A-7P or the covering provider during after hours 7P-7A, please log into the web site [email or web address] and access using universal [place] password for that web site. If you do not have the password, please call the hospital operator.  11/17/2020, 10:48 AM

## 2020-11-18 DIAGNOSIS — A419 Sepsis, unspecified organism: Secondary | ICD-10-CM | POA: Diagnosis not present

## 2020-11-18 DIAGNOSIS — Z515 Encounter for palliative care: Secondary | ICD-10-CM

## 2020-11-18 DIAGNOSIS — Z4659 Encounter for fitting and adjustment of other gastrointestinal appliance and device: Secondary | ICD-10-CM

## 2020-11-18 DIAGNOSIS — E11628 Type 2 diabetes mellitus with other skin complications: Secondary | ICD-10-CM | POA: Diagnosis not present

## 2020-11-18 DIAGNOSIS — N179 Acute kidney failure, unspecified: Secondary | ICD-10-CM | POA: Diagnosis not present

## 2020-11-18 DIAGNOSIS — M86172 Other acute osteomyelitis, left ankle and foot: Secondary | ICD-10-CM | POA: Diagnosis not present

## 2020-11-18 DIAGNOSIS — Z789 Other specified health status: Secondary | ICD-10-CM

## 2020-11-18 DIAGNOSIS — I4891 Unspecified atrial fibrillation: Secondary | ICD-10-CM | POA: Diagnosis not present

## 2020-11-18 DIAGNOSIS — L089 Local infection of the skin and subcutaneous tissue, unspecified: Secondary | ICD-10-CM | POA: Diagnosis not present

## 2020-11-18 DIAGNOSIS — R6520 Severe sepsis without septic shock: Secondary | ICD-10-CM | POA: Diagnosis not present

## 2020-11-18 DIAGNOSIS — N289 Disorder of kidney and ureter, unspecified: Secondary | ICD-10-CM | POA: Diagnosis not present

## 2020-11-18 LAB — COMPREHENSIVE METABOLIC PANEL
ALT: 15 U/L (ref 0–44)
AST: 19 U/L (ref 15–41)
Albumin: 2 g/dL — ABNORMAL LOW (ref 3.5–5.0)
Alkaline Phosphatase: 54 U/L (ref 38–126)
Anion gap: 11 (ref 5–15)
BUN: 95 mg/dL — ABNORMAL HIGH (ref 8–23)
CO2: 26 mmol/L (ref 22–32)
Calcium: 8.5 mg/dL — ABNORMAL LOW (ref 8.9–10.3)
Chloride: 106 mmol/L (ref 98–111)
Creatinine, Ser: 3.89 mg/dL — ABNORMAL HIGH (ref 0.44–1.00)
GFR, Estimated: 12 mL/min — ABNORMAL LOW (ref >60)
Glucose, Bld: 194 mg/dL — ABNORMAL HIGH (ref 70–99)
Potassium: 4.4 mmol/L (ref 3.5–5.1)
Sodium: 143 mmol/L (ref 135–145)
Total Bilirubin: 0.8 mg/dL (ref 0.3–1.2)
Total Protein: 5 g/dL — ABNORMAL LOW (ref 6.5–8.1)

## 2020-11-18 LAB — CBC WITH DIFFERENTIAL/PLATELET
Abs Immature Granulocytes: 0.58 10*3/uL — ABNORMAL HIGH (ref 0.00–0.07)
Basophils Absolute: 0 10*3/uL (ref 0.0–0.1)
Basophils Relative: 0 %
Eosinophils Absolute: 0.1 10*3/uL (ref 0.0–0.5)
Eosinophils Relative: 1 %
HCT: 20.5 % — ABNORMAL LOW (ref 36.0–46.0)
Hemoglobin: 6.4 g/dL — ABNORMAL LOW (ref 12.0–15.0)
Immature Granulocytes: 6 %
Lymphocytes Relative: 11 %
Lymphs Abs: 1.2 10*3/uL (ref 0.7–4.0)
MCH: 27.8 pg (ref 26.0–34.0)
MCHC: 31.2 g/dL (ref 30.0–36.0)
MCV: 89.1 fL (ref 80.0–100.0)
Monocytes Absolute: 0.6 10*3/uL (ref 0.1–1.0)
Monocytes Relative: 6 %
Neutro Abs: 8.2 10*3/uL — ABNORMAL HIGH (ref 1.7–7.7)
Neutrophils Relative %: 76 %
Platelets: 124 10*3/uL — ABNORMAL LOW (ref 150–400)
RBC: 2.3 MIL/uL — ABNORMAL LOW (ref 3.87–5.11)
RDW: 16 % — ABNORMAL HIGH (ref 11.5–15.5)
WBC: 10.6 10*3/uL — ABNORMAL HIGH (ref 4.0–10.5)
nRBC: 0.2 % (ref 0.0–0.2)

## 2020-11-18 LAB — PREPARE RBC (CROSSMATCH)

## 2020-11-18 LAB — HEMOGLOBIN AND HEMATOCRIT, BLOOD
HCT: 23 % — ABNORMAL LOW (ref 36.0–46.0)
HCT: 27.4 % — ABNORMAL LOW (ref 36.0–46.0)
Hemoglobin: 7.3 g/dL — ABNORMAL LOW (ref 12.0–15.0)
Hemoglobin: 9 g/dL — ABNORMAL LOW (ref 12.0–15.0)

## 2020-11-18 LAB — CULTURE, BLOOD (ROUTINE X 2)
Culture: NO GROWTH
Culture: NO GROWTH
Special Requests: ADEQUATE

## 2020-11-18 LAB — GLUCOSE, CAPILLARY
Glucose-Capillary: 185 mg/dL — ABNORMAL HIGH (ref 70–99)
Glucose-Capillary: 171 mg/dL — ABNORMAL HIGH (ref 70–99)
Glucose-Capillary: 212 mg/dL — ABNORMAL HIGH (ref 70–99)
Glucose-Capillary: 177 mg/dL — ABNORMAL HIGH (ref 70–99)
Glucose-Capillary: 210 mg/dL — ABNORMAL HIGH (ref 70–99)

## 2020-11-18 LAB — HEPARIN LEVEL (UNFRACTIONATED): Heparin Unfractionated: 0.57 IU/mL (ref 0.30–0.70)

## 2020-11-18 LAB — MAGNESIUM: Magnesium: 2 mg/dL (ref 1.7–2.4)

## 2020-11-18 LAB — PHOSPHORUS: Phosphorus: 3.8 mg/dL (ref 2.5–4.6)

## 2020-11-18 MED ORDER — METOPROLOL TARTRATE 50 MG PO TABS
50.0000 mg | ORAL_TABLET | Freq: Two times a day (BID) | ORAL | Status: DC
  Administered 2020-11-18 – 2020-11-19 (×3): 50 mg
  Filled 2020-11-18 (×3): qty 1

## 2020-11-18 MED ORDER — DILTIAZEM HCL 25 MG/5ML IV SOLN
10.0000 mg | Freq: Once | INTRAVENOUS | Status: AC
  Administered 2020-11-18: 10 mg via INTRAVENOUS
  Filled 2020-11-18: qty 5

## 2020-11-18 MED ORDER — SODIUM CHLORIDE 0.9% IV SOLUTION: Freq: Once | INTRAVENOUS | Status: DC

## 2020-11-18 MED ORDER — SODIUM CHLORIDE 0.9% IV SOLUTION: Freq: Once | INTRAVENOUS | Status: AC

## 2020-11-18 NOTE — Progress Notes (Signed)
[facility]  ROUNDING NOTE   Subjective:  Case discussed with nursing this AM. Right foot with some bleeding. Heparin drip has been stopped. Good urine output noted.   Objective:  Vital signs in last 24 hours:  Temp:  [97.5 F (36.4 C)-98.7 F (37.1 C)] 97.5 F (36.4 C) (11/07 0825) Pulse Rate:  [25-146] 134 (11/07 1000) Resp:  [14-34] 17 (11/07 1000) BP: (115-153)/(68-111) 123/109 (11/07 0825) SpO2:  [87 %-100 %] 100 % (11/07 1000)  Weight change:  Filed Weights   11/09/20 1214 11/16/20 0330 11/17/20 0500  Weight: 86.2 kg 91.7 kg 91.8 kg    Intake/Output: I/O last 3 completed shifts: In: 5805.3 [P.O.:50; I.V.:3254.7; NG/GT:2200.5; IV Piggyback:300.1] Out: 2075 [Urine:2075]   Intake/Output this shift:  No intake/output data recorded.  Physical Exam: General: NAD  Head: Atraumatic. Moist oral mucosal membranes  Eyes: Anicteric  Lungs:  Scattered rhonchi  Heart: Irregular rhythm  Abdomen:  Soft, nontender  Extremities: No peripheral edema.  Surgical dressing on left foot  Neurologic: Lethargic but arousable  Skin: Warm        Basic Metabolic Panel: Recent Labs  Lab 11/14/20 0526 11/15/20 0942 11/16/20 0246 11/17/20 0815 11/18/20 0227  NA 133* 134* 139 139 143  K 4.5 3.9 3.9 4.1 4.4  CL 104 102 101 102 106  CO2 16* 19* 25 28 26   GLUCOSE 183* 147* 166* 175* 194*  BUN 85* 106* 99* 97* 95*  CREATININE 4.91* 5.23* 4.79* 4.24* 3.89*  CALCIUM 8.9 8.3* 8.2* 8.5* 8.5*  MG  --  2.4 2.4 2.1 2.0  PHOS 7.8*  --  5.9* 4.9* 3.8     Liver Function Tests: Recent Labs  Lab 11/14/20 0526 11/16/20 0246 11/17/20 0815 11/18/20 0227  AST  --  19 19 19   ALT  --  18 18 15   ALKPHOS  --  59 66 54  BILITOT  --  0.9 0.7 0.8  PROT  --  5.6* 5.8* 5.0*  ALBUMIN 2.2* 2.1* 2.3* 2.0*    No results for input(s): LIPASE, AMYLASE in the last 168 hours. No results for input(s): AMMONIA in the last 168 hours.  CBC: Recent Labs  Lab 11/14/20 0526  11/15/20 0942 11/16/20 0246 11/17/20 0815 11/18/20 0227  WBC 14.5* 12.8* 10.8* 9.3 10.6*  NEUTROABS  --   --  8.5* 6.9 8.2*  HGB 8.5* 8.3* 8.0* 8.2* 6.4*  HCT 26.7* 26.1* 25.2* 26.2* 20.5*  MCV 87.0 87.9 86.3 88.8 89.1  PLT 195 197 168 134* 124*     Cardiac Enzymes: No results for input(s): CKTOTAL, CKMB, CKMBINDEX, TROPONINI in the last 168 hours.   BNP: Invalid input(s): POCBNP  CBG: Recent Labs  Lab 11/17/20 1532 11/17/20 1944 11/17/20 2354 11/18/20 0411 11/18/20 0747  GLUCAP 167* 148* 157* 185* 171*     Microbiology: Results for orders placed or performed during the hospital encounter of 11/09/20  Resp Panel by RT-PCR (Flu A&B, Covid) Nasopharyngeal Swab     Status: None   Collection Time: 11/09/20  6:25 PM   Specimen: Nasopharyngeal Swab; Nasopharyngeal(NP) swabs in vial transport medium  Result Value Ref Range Status   SARS Coronavirus 2 by RT PCR NEGATIVE NEGATIVE Final    Comment: (NOTE) SARS-CoV-2 target nucleic acids are NOT DETECTED.  The SARS-CoV-2 RNA is generally detectable in upper respiratory specimens during the acute phase of infection. The lowest concentration of SARS-CoV-2 viral copies this assay can detect is 138 copies/mL. A negative result does not preclude SARS-Cov-2 infection  and should not be used as the sole basis for treatment or other patient management decisions. A negative result may occur with  improper specimen collection/handling, submission of specimen other than nasopharyngeal swab, presence of viral mutation(s) within the areas targeted by this assay, and inadequate number of viral copies(<138 copies/mL). A negative result must be combined with clinical observations, patient history, and epidemiological information. The expected result is Negative.  Fact Sheet for Patients:  EntrepreneurPulse.com.au  Fact Sheet for Healthcare Providers:  IncredibleEmployment.be  This test is no t yet  approved or cleared by the [place] FDA and  has been authorized for detection and/or diagnosis of SARS-CoV-2 by FDA under an Emergency Use Authorization (EUA). This EUA will remain  in effect (meaning this test can be used) for the duration of the COVID-19 declaration under Section 564(b)(1) of the Act, 21 U.S.C.section 360bbb-3(b)(1), unless the authorization is terminated  or revoked sooner.       Influenza A by PCR NEGATIVE NEGATIVE Final   Influenza B by PCR NEGATIVE NEGATIVE Final    Comment: (NOTE) The Xpert Xpress SARS-CoV-2/FLU/RSV plus assay is intended as an aid in the diagnosis of influenza from Nasopharyngeal swab specimens and should not be used as a sole basis for treatment. Nasal washings and aspirates are unacceptable for Xpert Xpress SARS-CoV-2/FLU/RSV testing.  Fact Sheet for Patients: EntrepreneurPulse.com.au  Fact Sheet for Healthcare Providers: IncredibleEmployment.be  This test is not yet approved or cleared by the [place] FDA and has been authorized for detection and/or diagnosis of SARS-CoV-2 by FDA under an Emergency Use Authorization (EUA). This EUA will remain in effect (meaning this test can be used) for the duration of the COVID-19 declaration under Section 564(b)(1) of the Act, 21 U.S.C. section 360bbb-3(b)(1), unless the authorization is terminated or revoked.  Performed at [facility], [address] [place], [place] [address]   MRSA Next Gen by PCR, Nasal     Status: None   Collection Time: 11/10/20  1:14 AM   Specimen: Nasal Mucosa; Nasal Swab  Result Value Ref Range Status   MRSA by PCR Next Gen NOT DETECTED NOT DETECTED Final    Comment: (NOTE) The GeneXpert MRSA Assay (FDA approved for NASAL specimens only), is one component of a comprehensive MRSA colonization surveillance program. It is not intended to diagnose MRSA infection nor to guide or monitor treatment for MRSA  infections. Test performance is not FDA approved in patients less than 3 years old. Performed at [facility], [address] [place], [place] [address]   Aerobic/Anaerobic Culture w Gram Stain (surgical/deep wound)     Status: None   Collection Time: 11/11/20  3:23 PM   Specimen: Foot, Left; Abscess  Result Value Ref Range Status   Specimen Description   Final    WOUND LEFT FOOT ABSCESS Performed at [facility], [address] [place], [place] [address]    Special Requests   Final    NONE Performed at [facility], [address] [place], [place] [address]    Gram Stain   Final    NO ORGANISMS SEEN SQUAMOUS EPITHELIAL CELLS PRESENT MODERATE WBC PRESENT, PREDOMINANTLY MONONUCLEAR MODERATE GRAM POSITIVE COCCI Performed at [facility], [place] [address] [place], [place] [address]    Culture   Final    RARE STAPHYLOCOCCUS AUREUS RARE PASTEURELLA MULTOCIDA Usually susceptible to penicillin and other beta lactam agents,quinolones,macrolides and tetracyclines. MIXED ANAEROBIC FLORA PRESENT.  CALL LAB IF FURTHER IID REQUIRED.  Report Status 11/17/2020 FINAL  Final   Organism ID, Bacteria STAPHYLOCOCCUS AUREUS  Final      Susceptibility   Staphylococcus aureus - MIC*    CIPROFLOXACIN <=0.5 SENSITIVE Sensitive     ERYTHROMYCIN <=0.25 SENSITIVE Sensitive     GENTAMICIN <=0.5 SENSITIVE Sensitive     OXACILLIN <=0.25 SENSITIVE Sensitive     TETRACYCLINE <=1 SENSITIVE Sensitive     VANCOMYCIN <=0.5 SENSITIVE Sensitive     TRIMETH/SULFA <=10 SENSITIVE Sensitive     CLINDAMYCIN <=0.25 SENSITIVE Sensitive     RIFAMPIN <=0.5 SENSITIVE Sensitive     Inducible Clindamycin NEGATIVE Sensitive     * RARE STAPHYLOCOCCUS AUREUS  Aerobic/Anaerobic Culture w Gram Stain (surgical/deep wound)     Status: None   Collection Time: 11/11/20  3:32 PM   Specimen: Other Source; Tissue  Result Value Ref Range Status   Specimen Description   Final    WOUND 2ND  METATARSAL Performed at [facility], [address] [place], [place] [address]    Special Requests   Final    NONE Performed at [facility], [place], [place] [address]    Gram Stain   Final    NO ORGANISMS SEEN SQUAMOUS EPITHELIAL CELLS PRESENT FEW WBC PRESENT, PREDOMINANTLY MONONUCLEAR FEW GRAM POSITIVE COCCI Performed at [facility], [place] [address], [place], [place] [address]    Culture   Final    RARE STREPTOCOCCUS MITIS/ORALIS RARE PASTEURELLA MULTOCIDA Usually susceptible to penicillin and other beta lactam agents,quinolones,macrolides and tetracyclines. ABUNDANT BACTEROIDES SPECIES BETA LACTAMASE POSITIVE RARE STAPHYLOCOCCUS AUREUS    Report Status 11/17/2020 FINAL  Final   Organism ID, Bacteria STREPTOCOCCUS MITIS/ORALIS  Final   Organism ID, Bacteria STAPHYLOCOCCUS AUREUS  Final      Susceptibility   Staphylococcus aureus - MIC*    CIPROFLOXACIN <=0.5 SENSITIVE Sensitive     ERYTHROMYCIN <=0.25 SENSITIVE Sensitive     GENTAMICIN <=0.5 SENSITIVE Sensitive     OXACILLIN <=0.25 SENSITIVE Sensitive     TETRACYCLINE <=1 SENSITIVE Sensitive     VANCOMYCIN <=0.5 SENSITIVE Sensitive     TRIMETH/SULFA <=10 SENSITIVE Sensitive     CLINDAMYCIN <=0.25 SENSITIVE Sensitive     RIFAMPIN <=0.5 SENSITIVE Sensitive     Inducible Clindamycin NEGATIVE Sensitive     * RARE STAPHYLOCOCCUS AUREUS   Streptococcus mitis/oralis - MIC*    TETRACYCLINE 0.5 SENSITIVE Sensitive     VANCOMYCIN 0.5 SENSITIVE Sensitive     CLINDAMYCIN <=0.25 SENSITIVE Sensitive     * RARE STREPTOCOCCUS MITIS/ORALIS  Aerobic/Anaerobic Culture w Gram Stain (surgical/deep wound)     Status: None   Collection Time: 11/11/20  4:26 PM   Specimen: Other Source; Tissue  Result Value Ref Range Status   Specimen Description   Final    WOUND LEFT METATARSAL Performed at [facility], [address] [place], [place] [address]    Special Requests   Final     NONE Performed at [facility], [place], [place] [address]    Gram Stain NO WBC SEEN NO ORGANISMS SEEN   Final   Culture   Final    RARE BACTEROIDES SPECIES BETA LACTAMASE POSITIVE RARE PEPTOSTREPTOCOCCUS MICROS Standardized susceptibility testing for this organism is not available. Performed at [facility], [place] [address], [place], [place] [address]    Report Status 11/17/2020 FINAL  Final  MRSA Next Gen by PCR, Nasal     Status: None   Collection  Time: 11/13/20 11:22 AM   Specimen: Nasal Mucosa; Nasal Swab  Result Value Ref Range Status   MRSA by PCR Next Gen NOT DETECTED NOT DETECTED Final    Comment: (NOTE) The GeneXpert MRSA Assay (FDA approved for NASAL specimens only), is one component of a comprehensive MRSA colonization surveillance program. It is not intended to diagnose MRSA infection nor to guide or monitor treatment for MRSA infections. Test performance is not FDA approved in patients less than 65 years old. Performed at [facility], [address] [place], [place] [address]   CULTURE, BLOOD (ROUTINE X 2) w Reflex to ID Panel     Status: None (Preliminary result)   Collection Time: 11/13/20 12:44 PM   Specimen: BLOOD  Result Value Ref Range Status   Specimen Description BLOOD LEFT ANTECUBITAL  Final   Special Requests   Final    BOTTLES DRAWN AEROBIC AND ANAEROBIC Blood Culture adequate volume   Culture   Final    NO GROWTH 4 DAYS Performed at [facility], [address], [place], [place] [address]    Report Status PENDING  Incomplete  CULTURE, BLOOD (ROUTINE X 2) w Reflex to ID Panel     Status: None (Preliminary result)   Collection Time: 11/13/20 12:52 PM   Specimen: BLOOD  Result Value Ref Range Status   Specimen Description BLOOD BLOOD LEFT HAND  Final   Special Requests   Final    BOTTLES DRAWN AEROBIC ONLY Blood Culture results may not be optimal due to an inadequate volume of blood received in  culture bottles   Culture   Final    NO GROWTH 4 DAYS Performed at [facility], [address] [place], [place] [address]    Report Status PENDING  Incomplete  Culture, Respiratory w Gram Stain     Status: None   Collection Time: 11/13/20  3:43 PM   Specimen: Tracheal Aspirate; Respiratory  Result Value Ref Range Status   Specimen Description   Final    TRACHEAL ASPIRATE Performed at [facility], [address] [place], [place] [address]    Special Requests   Final    NONE Performed at [facility], [place], [place] [address]    Gram Stain   Final    NO SQUAMOUS EPITHELIAL CELLS SEEN FEW WBC SEEN NO ORGANISMS SEEN Performed at [facility], [place] [address], [place], [place] [address]    Culture RARE CANDIDA ALBICANS  Final   Report Status 11/16/2020 FINAL  Final    Coagulation Studies: No results for input(s): LABPROT, INR in the last 72 hours.   Urinalysis: No results for input(s): COLORURINE, LABSPEC, PHURINE, GLUCOSEU, HGBUR, BILIRUBINUR, KETONESUR, PROTEINUR, UROBILINOGEN, NITRITE, LEUKOCYTESUR in the last 72 hours.  Invalid input(s): APPERANCEUR     Imaging: DG Abd Portable 1V  Result Date: 11/17/2020 CLINICAL DATA:  Check gastric catheter placement EXAM: PORTABLE ABDOMEN - 1 VIEW COMPARISON:  11/15/2020 FINDINGS: Weighted feeding catheter is again noted in the distal stomach directed towards the pool or so. No free air is seen. IMPRESSION: Weighted feeding catheter in the distal stomach. Electronically Signed   By: Inez Catalina M.D.   On: 11/17/2020 19:21     Medications:    sodium chloride 5 mL/hr at 11/18/20 0700   ampicillin-sulbactam (UNASYN) IV 3 g (11/18/20 0939)   feeding supplement (VITAL 1.5 CAL) 1,000 mL (11/18/20 0531)   heparin Stopped (11/18/20 0355)   lactated ringers 75  mL/hr at 11/18/20 0700    sodium chloride   Intravenous Once   allopurinol  200 mg Per Tube Daily   vitamin C  500 mg  Per Tube BID   budesonide (PULMICORT) nebulizer solution  0.5 mg Nebulization BID   Chlorhexidine Gluconate Cloth  6 each Topical Daily   diltiazem  60 mg Per Tube Q6H   docusate  100 mg Per Tube BID   feeding supplement (NEPRO CARB STEADY)  237 mL Per Tube TID BM   ferrous gluconate  324 mg Oral Q breakfast   free water  30 mL Per Tube Q4H   insulin aspart  0-6 Units Subcutaneous Q4H   ipratropium-albuterol  3 mL Nebulization TID   levothyroxine  100 mcg Per Tube Q0600   mouth rinse  15 mL Mouth Rinse BID   metoprolol tartrate  50 mg Per Tube BID   multivitamin with minerals  1 tablet Per Tube Daily   nutrition supplement (JUVEN)  1 packet Per Tube BID BM   pantoprazole (PROTONIX) IV  40 mg Intravenous Daily   polyethylene glycol  17 g Per Tube Daily   venlafaxine  100 mg Per Tube BID   sodium chloride, [DISCONTINUED] acetaminophen **OR** acetaminophen, acetaminophen, albuterol, diltiazem, meclizine, ondansetron **OR** ondansetron (ZOFRAN) IV  Assessment/ Plan:  Ms. Autumn [name] is a 70 y.o.  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  Creatinine currently 3.9.  However good urine output noted.  Therefore no immediate need for dialysis at the moment.    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  Bleeding from the  foot noted.   #Anemia of CKD Hemoglobin low at 6.4.  Recommend blood transfusion but defer to primary team.   LOS: 9 Autumn [name] 11/7/202210:19 AM

## 2020-11-18 NOTE — Consult Note (Signed)
ANTICOAGULATION CONSULT NOTE   Pharmacy Consult for Heparin gtt Indication: atrial fibrillation  Patient Measurements: Height: 5\' 6"  (167.6 cm) Weight: 91.8 kg (202 lb 6.1 oz) IBW/kg (Calculated) : 59.3 Heparin Dosing Weight: 77.8 kg  Labs: Recent Labs    11/16/20 0246 11/16/20 1153 11/17/20 0815 11/17/20 1854 11/18/20 0227  HGB 8.0*  --  8.2*  --  6.4*  HCT 25.2*  --  26.2*  --  20.5*  PLT 168  --  134*  --  124*  HEPARINUNFRC 0.97*   < > 1.03* 0.50 0.57  CREATININE 4.79*  --  4.24*  --  3.89*   < > = values in this interval not displayed.     Estimated Creatinine Clearance: 15.8 mL/min (A) (by C-G formula based on SCr of 3.89 mg/dL (H)).   Medical History: Past Medical History:  Diagnosis Date   Diabetes mellitus without complication ([place])    Hypertension    Kidney stone    Thyroid disease     Medications:  Scheduled:   sodium chloride   Intravenous Once   allopurinol  200 mg Per Tube Daily   vitamin C  500 mg Per Tube BID   budesonide (PULMICORT) nebulizer solution  0.5 mg Nebulization BID   Chlorhexidine Gluconate Cloth  6 each Topical Daily   diltiazem  60 mg Per Tube Q6H   docusate  100 mg Per Tube BID   feeding supplement (NEPRO CARB STEADY)  237 mL Per Tube TID BM   ferrous gluconate  324 mg Oral Q breakfast   free water  30 mL Per Tube Q4H   insulin aspart  0-6 Units Subcutaneous Q4H   ipratropium-albuterol  3 mL Nebulization TID   levothyroxine  100 mcg Per Tube Q0600   mouth rinse  15 mL Mouth Rinse BID   metoprolol tartrate  25 mg Per Tube BID   multivitamin with minerals  1 tablet Per Tube Daily   nutrition supplement (JUVEN)  1 packet Per Tube BID BM   pantoprazole (PROTONIX) IV  40 mg Intravenous Daily   polyethylene glycol  17 g Per Tube Daily   venlafaxine  100 mg Per Tube BID    Heparin Dosing Weight: 77.8 kg  Assessment: Patient admitted with new onset A/Fib (CHADS-VASc - 6) and worsening renal function.  Antibiotics for  osteomyelitis were initiated (s/p toe amputation & I/D of abscess) but post-op c/b unresponsiveness and neurochanges on 11/2 transferred to ICU.  Pt noted to have Patent Foramen Ovale and Afib, but anticoagulation initially held for further w/u of neurologic deficits.    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 There was no mass effect or hemorrhage and pharmacy consulted for heparin drip mgmt to resume once clear by Neurology on 11/4.  Noted history of DM, CKD-stage 4,HTN, thyroid disorder, and liver cirrhosis secondary to NASH  Date Time aPTT/HL Rate/Comment 10/30  0115 0.10  Subtherapeutic  10/30   2051 0.18  Subtherapeutic increase drip rate to 1800 units/hr 10/31  0704 0.43  Therapeutic x1; 1800 un/hr 11/02 0855 0.64  Therapeutic x2; 1800 un/hr 11/05 0246 0.97  Supratherapeutic; 1800 un/hr 11/05 1153 >1.1  Supratherapeutic; 1650 un/hr 11/05 2143 0.99  Supratherapeutic 11/06 0815 1.03  Supratherapeutic; 1250 units/hr 11/06 1854 0.50  Therapeutic x1; 1000 units/hr 11/07 0227 0.57  Therapeutic x 2  Goal of Therapy:  Heparin level 0.3-0.7 units/ml Monitor platelets  by anticoagulation protocol: Yes   Plan:  HL therapeutic, continue heparin infusion rate to 1000 units/hr Recheck heparin level daily w/ AM labs CBC daily while on heparin  Renda Rolls, PharmD, [facility] 11/18/2020 3:23 AM

## 2020-11-18 NOTE — Significant Event (Signed)
Patient's nurse notified me that patient has been having continuous bleeding from the left foot.  Repeat CBC done today showed hemoglobin dropped from 8.2 and it is around 6.4.  Discussed with on-call neurologist.  Plan is to hold heparin for now due to active bleed from the left foot.  We will be transfusing 1 unit of PRBC.  We will continue to monitor closely.  Gean Birchwood

## 2020-11-18 NOTE — Progress Notes (Signed)
Telephone order received from Dr. Hal Hope to hold/stop heparin gtt at this time and transfuse pRBC's. Will continue to monitor closely.

## 2020-11-18 NOTE — Progress Notes (Signed)
Dr. Hal Hope notified of patients multiple loose BM's and skin breakdown in perineal area related to incontinence from stool and urine, requesting flexi seal and foley catheter to be placed. New orders received to insert flexi seal and foley catheter.

## 2020-11-18 NOTE — Progress Notes (Signed)
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 Other comorbidities include sepsis without shock secondary to osteomyelitis and cellulitis left foot (IV zosyn), AKI on CKD, rapid afib (Cardizem + Heparin gtt), NSTEMI suspect demand ischemia, anemia acute on chronic. Transferred to ICU and intubated 11/2 after evolving neuro changes with unresponsiveness after coughing episode. 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    PT Comments    Pt in bed, lethargic requiring multimodal cues for interaction, but once awakened interactive throughout session. OOB contraindicated this session per nursing due to continual bleeding LLE s/p metatarsal amputation, recently transfused. Pt does demonstrate improvement in ability to follow commands, able to mobilize LLE w/ cues, tactile assist for repetition. Pt unable to provide voluntary movement of LLE except for x 1 marching, all other movement AAROM assist.PT to continue addressing functional impairments based on pt tolerance and presentation. Skilled PT intervention is indicated to address deficits in function, mobility, and to return to PLOF as able.  CIR remains primary discharge recommendation to maximize functional mobility.    Recommendations for follow up therapy are one component of a multi-disciplinary discharge planning process, led by the attending physician.  Recommendations may be updated based on patient status, additional functional criteria and  insurance authorization.  Follow Up Recommendations  Acute inpatient rehab (3hours/day)     Assistance Recommended at Discharge Frequent or constant Supervision/Assistance  Equipment Recommendations  None recommended by PT    Recommendations for Other Services       Precautions / Restrictions Precautions Precautions: Fall Restrictions Weight Bearing Restrictions: Yes LLE Weight Bearing: Non weight bearing     Mobility  Bed Mobility               General bed mobility comments: OOB contraindicated 2/2 LLE bleeding    Transfers                        Ambulation/Gait                   Stairs             Wheelchair Mobility    Modified Rankin (Stroke Patients Only)       Balance                                            Cognition Arousal/Alertness: Lethargic Behavior During Therapy: WFL for tasks assessed/performed Overall Cognitive Status: Difficult to assess Area of Impairment: Following commands;Safety/judgement;Awareness                       Following Commands: Follows one step commands inconsistently Safety/Judgement: Decreased awareness of safety;Decreased awareness of deficits     General Comments: Lethargic beginning and end of session but interactive through 50% tx, follows commands incosistently but able to state things such as "thank you for your help"        Exercises General  Exercises - Lower Extremity Heel Slides: Strengthening;AAROM;Both;15 reps;Supine;AROM Hip ABduction/ADduction: AAROM;Strengthening;Both;15 reps;Supine Hip Flexion/Marching: AAROM;AROM;Both;15 reps;Supine    General Comments        Pertinent Vitals/Pain Pain Assessment: PAINAD Faces Pain Scale: No hurt Breathing: occasional labored breathing, short period of hyperventilation Negative Vocalization: occasional moan/groan, low speech, negative/disapproving quality Facial Expression: smiling or  inexpressive Body Language: relaxed Consolability: distracted or reassured by voice/touch PAINAD Score: 3 Pain Intervention(s): Monitored during session    Home Living                          Prior Function            PT Goals (current goals can now be found in the care plan section) Progress towards PT goals: Progressing toward goals    Frequency    7X/week      PT Plan Current plan remains appropriate    Co-evaluation              AM-PAC PT "6 Clicks" Mobility   Outcome Measure  Help needed turning from your back to your side while in a flat bed without using bedrails?: A Lot Help needed moving from lying on your back to sitting on the side of a flat bed without using bedrails?: A Lot Help needed moving to and from a bed to a chair (including a wheelchair)?: Total Help needed standing up from a chair using your arms (e.g., wheelchair or bedside chair)?: Total Help needed to walk in hospital room?: Total Help needed climbing 3-5 steps with a railing? : Total 6 Click Score: 8    End of Session Equipment Utilized During Treatment: Oxygen Activity Tolerance: Treatment limited secondary to medical complications (Comment) Patient left: in bed;with call bell/phone within reach Nurse Communication: Mobility status PT Visit Diagnosis: Other abnormalities of gait and mobility (R26.89);Hemiplegia and hemiparesis;Muscle weakness (generalized) (M62.81);History of falling (Z91.81) Hemiplegia - Right/Left: Right Hemiplegia - dominant/non-dominant: Dominant     Time: 2248-2500 PT Time Calculation (min) (ACUTE ONLY): 14 min  Charges:                        [employer], SPT

## 2020-11-18 NOTE — Progress Notes (Signed)
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  She'd been following with Sruti Ayllon [facility] podiatrist outpatient and had recently refused surgery.  She'd recently been treated with antibiotics at [facility] for the diabetic foot infection with enterobacter aerogans culture positive.  She was admitted on 10/29 due to falls at home with worsening L foot chronic diabetic ulcer with osteomyelitis of the 2nd and 3rd metatarsals and cellulitis noted on MRI.  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  Her post operative course was complicated by unresponsiveness and neurologic changes, she was found to have Pyper Olexa stroke and was intubated for airway protection.  She was extubated 11/3 and transferred to [facility] service on 11/4.  See below for additional details 10/29: presented to [facility] ER s/p multiple falls, 5-weeks duration left foot pain, swelling, redness, hit head x2 during fall. ER workup: EKG showed rapid afib, CT head unremarkable, [place] Left LE no DVT, CXR unremarkable, XR L Foot concerning for osteomyelitis 2nd + 3rd metatarsals with soft tissue swelling of foot and ankle. Also with elevated BNP, elevated troponin, rapid Afib, concern for early sepsis, elevated BUN/cr acute renal failure on CKD (baseline Cr 3.19).  10/29: Admitted to floor, started on cardizem infusion for rate control,continuous heparin infusion for anticoagulation in setting of afib. IV Cefepime, vancomycin, flagyl initiated. Podiatry, cardiology, nephrology consulted. MRI left foot: septic arthritis of the second MTP joint and osteomyelitis of the second metatarsal and second proximal phalanx also with concern for an abscess and  cellulitis 10/31: Surgery completed: Left foot Amputation 2nd metatarsal and toe, I&D deep abscess multiple fascial planes, bone biopsy open deep third metatarsal. Note: Purulence found tracking up along the extensor tendons 11/1: ID consulted for infection mgmt; antibiotics adjusted to zosyn 11/2: Unable to complete MRI foot due to agitation despite IV ativan administration. Developed new confusion per RN which progressively worsened over the morning. Became unresponsive after coughing episode during pill swallowing; rapid response paged, stroke alert paged.  11/2: Transferred to ICU rm 10 after CT Head per stroke alert. Patient awake, attempting to follow simple commands however unable to lift arms on command. Tracking, responds to voice; however speech is garbled and unintelligible. When asked if in pain, patient nods and speaks but garbled. + oral secretions, concern for airway protection and patient was emergently intubated.  Notified patient's contact person [name], who arrived post intubation.  11/2: MRI Brain showed acute to early subacute infarction in the left posterior frontal cortical and subcortical brain, possibly affecting the precentral gyrus. There was no mass effect or hemorrhage. EEG with moderate diffuse slowing and occasional brief periods of intervening suppression, both indicative of cerebral dysfunction, medication effect, or both. No electrographic seizures or epileptiform discharges seen.  11/3: Remains MV, on propofol and fentanyl gtts. Turns head to voice, following limited commands.  11/3 extubated 11/4 TRH taking over  Assessment & Plan:   Principal Problem:   Sepsis ([place]) Active Problems:   Atrial fibrillation with RVR ([place])   Osteomyelitis of foot, left, acute ([place])   CKD stage 4 due to type 2 diabetes mellitus ([place])   Liver cirrhosis secondary to  NASH (nonalcoholic steatohepatitis) (HCC)   Hypertension   History of anemia due to CKD   Frequent falls   AKI  (acute kidney injury) ([place])   Cellulitis and abscess of foot, except toes   Infectious tenosynovitis   Wheeze   Atrial fibrillation with rapid ventricular response (HCC)   Acute Blood Loss Anemia In setting of heparin and recent surgical procedure - postop bleeding from surgical wound 2 units pRBC ordered Hb 6.4, follow  Heparin on hold Labs with iron def Follow B12 elevated, folate wnl  Stroke Neurology suspects periprocedural vs related to atrial fibrillation MRI brian with acute to early subacute infarction in the L posterior frontal cortical and subcortical brain, possibly affecting the precentral gyrus.  No mass effect or hemorrhage.  Chronic small vessel ischemic changes, some with chronic punctate hemosiderin deposition. Echo with EF 55-60%, dilated LA, patent foramen ovale She'll need completion of workup eventually with vascular imaging of head and neck Follow carotid US.   PT/OT/SLP Per neurology, benefits of resuming heparin likely outweigh risks of hemorrhagic conversion or hypertensive hemorrhage - heparin on hold again with bleeding above Discussed with Dr. Cheral Marker regarding when we could consider OR with podiatry.  Noted stroke may have been perioperative and debridement with anesthesia may carry increased risk of stroke.  Recommended waiting at least 1 week after stroke and discussing risks/benefits with pt/family (stroke diagnosed 11/2, so no sooner than 11/9).  Discussed with podiatry, consider block/sedation rather than general anesthesia?  Will continue to follow and discuss.  Acute Metabolic Encephalopathy Related to acute infection and above Delirium precautions Follow and w/u further as indicated  Septic Shock Diabetic Foot Infection Left Foot Osteomyelitis and Cellulitis  Septic Arthritis MRI foot with septic arthritis of second MTP joint and osteomyelitis of 2nd metatarsal head and second proximal phalanx, severe soft tissue swelling around second metatarsal  neck concerning for phlegmon developing abscess measuring 2.1x1 cm circumferentially around distal shaft.  Mild early osteomyelitis of 3rd metatarsal head.  Severe soft tissue edema of the forefoot c/w cellulitis. MRI ankle limited -> interval resection of 2nd metatarsal, longitudinal tear of peroneus brevis with peroneus tenosynovitis, distal tibialis posterior tenosynovitis and tendinopathy, extensor digitorum tenosynovitis.  Extensive subcutaneous edema circumferentially in distal calf.  Dorsal subcutaneous edema in foot. S/p 2nd ray amputation, I&D, and bone bx of 3rd metatarsal on 11/1 Blood cx 11/2 NGx2 Surgical cx with pasteurella multocida and rare strep mitis/oralis as well as MSSA Transitioned to unasyn per ID, appreciate assistance Podiatry following, needs to return to OR at some point for further I&D +/- amputation, but holding off with recent stroke - will discuss with neurology to clarify timing (as above) NWB LLE  Acute Hypoxic Respiratory Failure Aspiration Pneumonia CXR 11/3 with right lung opacity concerning for superimposed infection - also with findings concerning for pulmonary edema and small right effusion CXR 11/4 with improvement, vascular congestion, no persistent interstitial or airspace edema Already on unasyn Resp cx with rare yeast, will follow final cx -> rare candida albicans  Atrial Fibrillation with RVR Appreciate cardiology recommendations Heparin on hold with bleeding Continue  metoprolol and diltiazem PO/per tube  Dysphagia 2/2 stroke SLP eval NG placed and tube feeds started.  Dysphagia 1 diet.  AKI on CKD IV Baseline creatinine around 3.2 Improved today, continue to monitor Avoid nephrotoxins Appreciate renal assistance  Elevated Troponin Cardiology following, thought 2/2 demand  Hx NASH cirrhosis Noted  Hypothyroidism Synthroid  T2DM SSI, follow A1c 6.3  DVT prophylaxis: SCD Code  Status:full  Family Communication: none at bedside  - called friend in chart 11/4 Disposition:   Status is: Inpatient  Remains inpatient appropriate because: continued need for IV therapies, consultant care       Consultants:  PCCM Nephrology Neurology ID Podiatry cardiology  Procedures:  EEG Impression and clinical correlation: This EEG was obtained while sedated and asleep and is abnormal due to moderate diffuse slowing and occasional brief periods of intervening suppression both indicative of cerebral dysfunction, medication effect, or both. Echo IMPRESSIONS     1. Left ventricular ejection fraction, by estimation, is 55 to 60%. The  left ventricle has normal function. The left ventricle has no regional  wall motion abnormalities. There is mild concentric left ventricular  hypertrophy. Left ventricular diastolic  parameters are indeterminate. Elevated left ventricular end-diastolic  pressure.   2. Right ventricular systolic function is normal. The right ventricular  size is normal. There is normal pulmonary artery systolic pressure.   3. Left atrial size was severely dilated.   4. The mitral valve is normal in structure. Trivial mitral valve  regurgitation. No evidence of mitral stenosis.   5. The aortic valve is tricuspid. Aortic valve regurgitation is not  visualized. No aortic stenosis is present.   6. The inferior vena cava is normal in size with greater than 50%  respiratory variability, suggesting right atrial pressure of 3 mmHg.   7. There is Damarian Priola small patent foramen ovale.  Procedures:             1) amputation ray second metatarsal and toe             2) incision and drainage deep abscess multiple fascial planes             3) bone biopsy open deep third metatarsal Antimicrobials:  Anti-infectives (From admission, onward)    Start     Dose/Rate Route Frequency Ordered Stop   11/14/20 2200  Ampicillin-Sulbactam (UNASYN) 3 g in sodium chloride 0.9 % 100 mL IVPB        3 g 200 mL/hr over 30 Minutes  Intravenous Every 12 hours 11/14/20 1610     11/14/20 0900  piperacillin-tazobactam (ZOSYN) IVPB 2.25 g  Status:  Discontinued        2.25 g 100 mL/hr over 30 Minutes Intravenous Every 8 hours 11/14/20 0805 11/14/20 1609   11/13/20 2200  piperacillin-tazobactam (ZOSYN) IVPB 3.375 g  Status:  Discontinued        3.375 g 12.5 mL/hr over 240 Minutes Intravenous Every 12 hours 11/13/20 0758 11/14/20 0805   11/13/20 0600  piperacillin-tazobactam (ZOSYN) IVPB 2.25 g        2.25 g 100 mL/hr over 30 Minutes Intravenous Every 8 hours 11/12/20 2155 11/13/20 1725   11/11/20 1600  vancomycin (VANCOCIN) IVPB 1000 mg/200 mL premix  Status:  Discontinued        1,000 mg 200 mL/hr over 60 Minutes Intravenous Every 48 hours 11/09/20 1618 11/10/20 1045   11/11/20 1558  vancomycin (VANCOCIN) powder  Status:  Discontinued          As needed 11/11/20 1558 11/11/20 1558   11/10/20 2000  vancomycin (VANCOCIN) IVPB 1000 mg/200 mL premix  Status:  Discontinued        1,000 mg 200 mL/hr over 60 Minutes Intravenous Every 48 hours 11/10/20 1918 11/12/20 0940   11/09/20 1800  ceFEPIme (MAXIPIME) 2 g in sodium chloride 0.9 % 100 mL IVPB  Status:  Discontinued  2 g 200 mL/hr over 30 Minutes Intravenous Every 24 hours 11/09/20 1619 11/12/20 2146   11/09/20 1619  vancomycin variable dose per unstable renal function (pharmacist dosing)  Status:  Discontinued         Does not apply See admin instructions 11/09/20 1619 11/12/20 1521   11/09/20 1615  vancomycin (VANCOREADY) IVPB 1750 mg/350 mL        1,750 mg 175 mL/hr over 120 Minutes Intravenous  Once 11/09/20 1606 11/09/20 1947   11/09/20 1600  metroNIDAZOLE (FLAGYL) IVPB 500 mg  Status:  Discontinued        500 mg 100 mL/hr over 60 Minutes Intravenous Every 8 hours 11/09/20 1555 11/12/20 2146       Subjective: Feels cold  Objective: Vitals:   11/18/20 1200 11/18/20 1300 11/18/20 1400 11/18/20 1500  BP: (!) 154/117 121/83 108/87 (!) 134/92  Pulse: (!)  106 94 (!) 117 (!) 113  Resp: (!) 26 17 (!) 22 17  Temp:      TempSrc:      SpO2: 99% 98% 97% 98%  Weight:      Height:        Intake/Output Summary (Last 24 hours) at 11/18/2020 1702 Last data filed at 11/18/2020 1600 Gross per 24 hour  Intake 2388.06 ml  Output 1975 ml  Net 413.06 ml   Filed Weights   11/09/20 1214 11/16/20 0330 11/17/20 0500  Weight: 86.2 kg 91.7 kg 91.8 kg    Examination:  General: No acute distress. Cardiovascular: RRR Lungs:unlabored Abdomen: Soft, nontender, nondistended  Neurological: RUE weakness, aphasia, able to understand Amillya Chavira bit more today Skin: Warm and dry. No rashes or lesions. Extremities: bloody drainage from LLE dressing    Data Reviewed: I have personally reviewed following labs and imaging studies  CBC: Recent Labs  Lab 11/14/20 0526 11/15/20 0942 11/16/20 0246 11/17/20 0815 11/18/20 0227 11/18/20 1237  WBC 14.5* 12.8* 10.8* 9.3 10.6*  --   NEUTROABS  --   --  8.5* 6.9 8.2*  --   HGB 8.5* 8.3* 8.0* 8.2* 6.4* 7.3*  HCT 26.7* 26.1* 25.2* 26.2* 20.5* 23.0*  MCV 87.0 87.9 86.3 88.8 89.1  --   PLT 195 197 168 134* 124*  --     Basic Metabolic Panel: Recent Labs  Lab 11/14/20 0526 11/15/20 0942 11/16/20 0246 11/17/20 0815 11/18/20 0227  NA 133* 134* 139 139 143  K 4.5 3.9 3.9 4.1 4.4  CL 104 102 101 102 106  CO2 16* 19* 25 28 26   GLUCOSE 183* 147* 166* 175* 194*  BUN 85* 106* 99* 97* 95*  CREATININE 4.91* 5.23* 4.79* 4.24* 3.89*  CALCIUM 8.9 8.3* 8.2* 8.5* 8.5*  MG  --  2.4 2.4 2.1 2.0  PHOS 7.8*  --  5.9* 4.9* 3.8    GFR: Estimated Creatinine Clearance: 15.8 mL/min (Aqua Denslow) (by C-G formula based on SCr of 3.89 mg/dL (H)).  Liver Function Tests: Recent Labs  Lab 11/14/20 0526 11/16/20 0246 11/17/20 0815 11/18/20 0227  AST  --  19 19 19   ALT  --  18 18 15   ALKPHOS  --  59 66 54  BILITOT  --  0.9 0.7 0.8  PROT  --  5.6* 5.8* 5.0*  ALBUMIN 2.2* 2.1* 2.3* 2.0*    CBG: Recent Labs  Lab 11/17/20 2354  11/18/20 0411 11/18/20 0747 11/18/20 1132 11/18/20 1657  GLUCAP 157* 185* 171* 212* 177*     Recent Results (from the past 240 hour(s))  Resp Panel  by RT-PCR (Flu Windy Dudek&B, Covid) Nasopharyngeal Swab     Status: None   Collection Time: 11/09/20  6:25 PM   Specimen: Nasopharyngeal Swab; Nasopharyngeal(NP) swabs in vial transport medium  Result Value Ref Range Status   SARS Coronavirus 2 by RT PCR NEGATIVE NEGATIVE Final    Comment: (NOTE) SARS-CoV-2 target nucleic acids are NOT DETECTED.  The SARS-CoV-2 RNA is generally detectable in upper respiratory specimens during the acute phase of infection. The lowest concentration of SARS-CoV-2 viral copies this assay can detect is 138 copies/mL. Zyshonne Malecha negative result does not preclude SARS-Cov-2 infection and should not be used as the sole basis for treatment or other patient management decisions. Raeann Offner negative result may occur with  improper specimen collection/handling, submission of specimen other than nasopharyngeal swab, presence of viral mutation(s) within the areas targeted by this assay, and inadequate number of viral copies(<138 copies/mL). Karisa Nesser negative result must be combined with clinical observations, patient history, and epidemiological information. The expected result is Negative.  Fact Sheet for Patients:  EntrepreneurPulse.com.au  Fact Sheet for Healthcare Providers:  IncredibleEmployment.be  This test is no t yet approved or cleared by the [place] FDA and  has been authorized for detection and/or diagnosis of SARS-CoV-2 by FDA under an Emergency Use Authorization (EUA). This EUA will remain  in effect (meaning this test can be used) for the duration of the COVID-19 declaration under Section 564(b)(1) of the Act, 21 U.S.C.section 360bbb-3(b)(1), unless the authorization is terminated  or revoked sooner.       Influenza Michale Emmerich by PCR NEGATIVE NEGATIVE Final   Influenza B by PCR NEGATIVE  NEGATIVE Final    Comment: (NOTE) The Xpert Xpress SARS-CoV-2/FLU/RSV plus assay is intended as an aid in the diagnosis of influenza from Nasopharyngeal swab specimens and should not be used as Daesean Lazarz sole basis for treatment. Nasal washings and aspirates are unacceptable for Xpert Xpress SARS-CoV-2/FLU/RSV testing.  Fact Sheet for Patients: EntrepreneurPulse.com.au  Fact Sheet for Healthcare Providers: IncredibleEmployment.be  This test is not yet approved or cleared by the [place] FDA and has been authorized for detection and/or diagnosis of SARS-CoV-2 by FDA under an Emergency Use Authorization (EUA). This EUA will remain in effect (meaning this test can be used) for the duration of the COVID-19 declaration under Section 564(b)(1) of the Act, 21 U.S.C. section 360bbb-3(b)(1), unless the authorization is terminated or revoked.  Performed at [facility], [address] [place], [place] [address]   MRSA Next Gen by PCR, Nasal     Status: None   Collection Time: 11/10/20  1:14 AM   Specimen: Nasal Mucosa; Nasal Swab  Result Value Ref Range Status   MRSA by PCR Next Gen NOT DETECTED NOT DETECTED Final    Comment: (NOTE) The GeneXpert MRSA Assay (FDA approved for NASAL specimens only), is one component of Beanca Kiester comprehensive MRSA colonization surveillance program. It is not intended to diagnose MRSA infection nor to guide or monitor treatment for MRSA infections. Test performance is not FDA approved in patients less than 54 years old. Performed at [facility], [address] [place], [place] [address]   Aerobic/Anaerobic Culture w Gram Stain (surgical/deep wound)     Status: None   Collection Time: 11/11/20  3:23 PM   Specimen: Foot, Left; Abscess  Result Value Ref Range Status   Specimen Description   Final    WOUND LEFT FOOT ABSCESS Performed at [facility], [address], [place],  [address]  Special Requests   Final    NONE Performed at [facility], [place], [place] [address]    Gram Stain   Final    NO ORGANISMS SEEN SQUAMOUS EPITHELIAL CELLS PRESENT MODERATE WBC PRESENT, PREDOMINANTLY MONONUCLEAR MODERATE GRAM POSITIVE COCCI Performed at [facility], [place] [address], [place], [place] [address]    Culture   Final    RARE STAPHYLOCOCCUS AUREUS RARE PASTEURELLA MULTOCIDA Usually susceptible to penicillin and other beta lactam agents,quinolones,macrolides and tetracyclines. MIXED ANAEROBIC FLORA PRESENT.  CALL LAB IF FURTHER IID REQUIRED.    Report Status 11/17/2020 FINAL  Final   Organism ID, Bacteria STAPHYLOCOCCUS AUREUS  Final      Susceptibility   Staphylococcus aureus - MIC*    CIPROFLOXACIN <=0.5 SENSITIVE Sensitive     ERYTHROMYCIN <=0.25 SENSITIVE Sensitive     GENTAMICIN <=0.5 SENSITIVE Sensitive     OXACILLIN <=0.25 SENSITIVE Sensitive     TETRACYCLINE <=1 SENSITIVE Sensitive     VANCOMYCIN <=0.5 SENSITIVE Sensitive     TRIMETH/SULFA <=10 SENSITIVE Sensitive     CLINDAMYCIN <=0.25 SENSITIVE Sensitive     RIFAMPIN <=0.5 SENSITIVE Sensitive     Inducible Clindamycin NEGATIVE Sensitive     * RARE STAPHYLOCOCCUS AUREUS  Aerobic/Anaerobic Culture w Gram Stain (surgical/deep wound)     Status: None   Collection Time: 11/11/20  3:32 PM   Specimen: Other Source; Tissue  Result Value Ref Range Status   Specimen Description   Final    WOUND 2ND METATARSAL Performed at [facility], [address], [place], [place] [address]    Special Requests   Final    NONE Performed at [facility], [place], [place] [address]    Gram Stain   Final    NO ORGANISMS SEEN SQUAMOUS EPITHELIAL CELLS PRESENT FEW WBC PRESENT, PREDOMINANTLY MONONUCLEAR FEW GRAM POSITIVE COCCI Performed at [facility], [place] [address] [place],  [address]    Culture   Final    RARE STREPTOCOCCUS MITIS/ORALIS RARE  PASTEURELLA MULTOCIDA Usually susceptible to penicillin and other beta lactam agents,quinolones,macrolides and tetracyclines. ABUNDANT BACTEROIDES SPECIES BETA LACTAMASE POSITIVE RARE STAPHYLOCOCCUS AUREUS    Report Status 11/17/2020 FINAL  Final   Organism ID, Bacteria STREPTOCOCCUS MITIS/ORALIS  Final   Organism ID, Bacteria STAPHYLOCOCCUS AUREUS  Final      Susceptibility   Staphylococcus aureus - MIC*    CIPROFLOXACIN <=0.5 SENSITIVE Sensitive     ERYTHROMYCIN <=0.25 SENSITIVE Sensitive     GENTAMICIN <=0.5 SENSITIVE Sensitive     OXACILLIN <=0.25 SENSITIVE Sensitive     TETRACYCLINE <=1 SENSITIVE Sensitive     VANCOMYCIN <=0.5 SENSITIVE Sensitive     TRIMETH/SULFA <=10 SENSITIVE Sensitive     CLINDAMYCIN <=0.25 SENSITIVE Sensitive     RIFAMPIN <=0.5 SENSITIVE Sensitive     Inducible Clindamycin NEGATIVE Sensitive     * RARE STAPHYLOCOCCUS AUREUS   Streptococcus mitis/oralis - MIC*    TETRACYCLINE 0.5 SENSITIVE Sensitive     VANCOMYCIN 0.5 SENSITIVE Sensitive     CLINDAMYCIN <=0.25 SENSITIVE Sensitive     * RARE STREPTOCOCCUS MITIS/ORALIS  Aerobic/Anaerobic Culture w Gram Stain (surgical/deep wound)     Status: None   Collection Time: 11/11/20  4:26 PM   Specimen: Other Source; Tissue  Result Value Ref Range Status   Specimen Description   Final    WOUND LEFT METATARSAL Performed at [facility], [address] [place],  [address]  Special Requests   Final    NONE Performed at [facility], [place], [place] [address]    Gram Stain NO WBC SEEN NO ORGANISMS SEEN   Final   Culture   Final    RARE BACTEROIDES SPECIES BETA LACTAMASE POSITIVE RARE PEPTOSTREPTOCOCCUS MICROS Standardized susceptibility testing for this organism is not available. Performed at [facility], [place] [address], [place], [place] [address]    Report Status 11/17/2020 FINAL  Final  MRSA Next Gen by PCR, Nasal     Status: None   Collection Time:  11/13/20 11:22 AM   Specimen: Nasal Mucosa; Nasal Swab  Result Value Ref Range Status   MRSA by PCR Next Gen NOT DETECTED NOT DETECTED Final    Comment: (NOTE) The GeneXpert MRSA Assay (FDA approved for NASAL specimens only), is one component of Jalani Rominger comprehensive MRSA colonization surveillance program. It is not intended to diagnose MRSA infection nor to guide or monitor treatment for MRSA infections. Test performance is not FDA approved in patients less than 79 years old. Performed at [facility], [address] [place], [place] [address]   CULTURE, BLOOD (ROUTINE X 2) w Reflex to ID Panel     Status: None   Collection Time: 11/13/20 12:44 PM   Specimen: BLOOD  Result Value Ref Range Status   Specimen Description BLOOD LEFT ANTECUBITAL  Final   Special Requests   Final    BOTTLES DRAWN AEROBIC AND ANAEROBIC Blood Culture adequate volume   Culture   Final    NO GROWTH 5 DAYS Performed at [facility], [address] [place], [place] [address]    Report Status 11/18/2020 FINAL  Final  CULTURE, BLOOD (ROUTINE X 2) w Reflex to ID Panel     Status: None   Collection Time: 11/13/20 12:52 PM   Specimen: BLOOD  Result Value Ref Range Status   Specimen Description BLOOD BLOOD LEFT HAND  Final   Special Requests   Final    BOTTLES DRAWN AEROBIC ONLY Blood Culture results may not be optimal due to an inadequate volume of blood received in culture bottles   Culture   Final    NO GROWTH 5 DAYS Performed at [facility], [address], [place], [place] [address]    Report Status 11/18/2020 FINAL  Final  Culture, Respiratory w Gram Stain     Status: None   Collection Time: 11/13/20  3:43 PM   Specimen: Tracheal Aspirate; Respiratory  Result Value Ref Range Status   Specimen Description   Final    TRACHEAL ASPIRATE Performed at [facility], [address], [place], [place] [address]    Special Requests   Final    NONE Performed at [facility], [place], [place] [address]    Gram Stain   Final    NO SQUAMOUS EPITHELIAL CELLS SEEN FEW WBC SEEN NO ORGANISMS SEEN Performed at [facility], [place] [address], [place], [place] [address]    Culture RARE CANDIDA ALBICANS  Final   Report Status 11/16/2020 FINAL  Final         Radiology Studies: DG Abd Portable 1V  Result Date: 11/17/2020 CLINICAL DATA:  Check gastric catheter placement EXAM: PORTABLE ABDOMEN - 1 VIEW COMPARISON:  11/15/2020 FINDINGS: Weighted feeding catheter is again noted in the distal stomach directed towards the pool or so. No free air is seen. IMPRESSION: Weighted feeding catheter in the distal  stomach. Electronically Signed   By: Inez Catalina M.D.   On: 11/17/2020 19:21        Scheduled Meds:  sodium chloride   Intravenous Once   sodium chloride   Intravenous Once   allopurinol  200 mg Per Tube Daily   vitamin C  500 mg Per Tube BID   budesonide (PULMICORT) nebulizer solution  0.5 mg Nebulization BID   Chlorhexidine Gluconate Cloth  6 each Topical Daily   diltiazem  60 mg Per Tube Q6H   docusate  100 mg Per Tube BID   ferrous gluconate  324 mg Oral Q breakfast   free water  30 mL Per Tube Q4H   insulin aspart  0-6 Units Subcutaneous Q4H   ipratropium-albuterol  3 mL Nebulization TID   levothyroxine  100 mcg Per Tube Q0600   mouth rinse  15 mL Mouth Rinse BID   metoprolol tartrate  50 mg Per Tube BID   nutrition supplement (JUVEN)  1 packet Per Tube BID BM   pantoprazole (PROTONIX) IV  40 mg Intravenous Daily   polyethylene glycol  17 g Per Tube Daily   venlafaxine  100 mg Per Tube BID   Continuous Infusions:  sodium chloride 5 mL/hr at 11/18/20 0700   ampicillin-sulbactam (UNASYN) IV 3 g (11/18/20 0939)   feeding supplement (VITAL 1.5 CAL) 1,000 mL (11/18/20 0531)   lactated ringers 75 mL/hr at 11/18/20 0700     LOS: 9 days    Time spent: over 30 min    Fayrene Helper, MD [facility]   To  contact the attending provider between 7A-7P or the covering provider during after hours 7P-7A, please log into the web site [email or web address] and access using universal [place] password for that web site. If you do not have the password, please call the hospital operator.  11/18/2020, 5:02 PM

## 2020-11-18 NOTE — Progress Notes (Signed)
SLP Cancellation Note  Patient Details Name: Autumn [name] MRN: [id number] DOB: 06/12/1950   Cancelled treatment:       Reason Eval/Treat Not Completed:  Diet tolerance/clinical swallowing evaluation and speech/language tx unable to be completed at this time. Pt observed working with PT. Pt notably lethargic per SLP observation. Noted pt with no PO intake recorded in EMR. Pt receiving TFs via NG. Will continue efforts, as appropriate.    Cherrie Gauze, M.S., [facility] [phone] ([place])    Quintella Baton 11/18/2020, 2:19 PM

## 2020-11-18 NOTE — Progress Notes (Signed)
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 Other comorbidities include sepsis without shock secondary to osteomyelitis and cellulitis left foot (IV zosyn), AKI on CKD, rapid afib (Cardizem + Heparin gtt), NSTEMI suspect demand ischemia, anemia acute on chronic. Transferred to ICU and intubated 11/2 after evolving neuro changes with unresponsiveness after coughing episode. 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   OT comments  Pt has received one unit of blood today with an improved hemoglobin of 7.3. However, per RN pt continues to bleed from incision site on L foot and was seen from bed level for safety. Focus on R UE NMR for self care tasks. Pt in quiet room with minimal distractions but pt does appear to be externally and internally distracted during session. Pt continues to have difficulty with expression of speech but continues to make and effort to make her needs known during session. Attempted to ask more "yes" and "no " questions. Pt clearly asking for "ice water" during session. Focus on forced use of R UE with wash cloth placed on face and pt reaching up with R UE to wash face with increased time and mod multimodal cuing to initiate. Hand over hand assistance to use suction toothette for oral care.  PNF movement pattern D1 in gravity eliminated position x 5 reps with some control noted for speed. Pt requesting something to drink and selects with R UE her choice. OT assisted pt with  small sips from spoon for safety and to increase oral intake. Pt making progress towards goals and continued to benefit from OT intervention.    Recommendations for follow up therapy are one component of a multi-disciplinary discharge planning process, led by the attending physician.  Recommendations may be updated based on patient status, additional functional criteria and insurance authorization.    Follow Up Recommendations  Acute inpatient rehab (3hours/day)       Equipment Recommendations  Other (comment) (defer to next venue of care)       Precautions / Restrictions Precautions Precautions: Fall              ADL either performed or assessed with clinical judgement   ADL Overall ADL's : Needs assistance/impaired Eating/Feeding: Bed level;Total assistance Eating/Feeding Details (indicate cue type and reason): total A to increase oral intake Grooming: Wash/dry face;Oral care;Cueing for safety;Minimal assistance;Moderate assistance;Cueing for sequencing;Bed level Grooming Details (indicate cue type and reason): HOB elevated. Use of suction toothette for oral care.                                    Extremity/Trunk Assessment Upper Extremity Assessment Upper Extremity Assessment: RUE deficits/detail RUE Deficits / Details: was able to lift UE to face to wash face and squeezed therapist hand. unable to follow cues for formal assessment. RUE Coordination: decreased fine motor;decreased gross motor LUE Deficits / Details: Good grip, shoulder,elbow 2/5             Cognition Arousal/Alertness: Awake/alert Behavior During Therapy:  Flat affect Overall Cognitive Status: Difficult to assess                                 General Comments: Pt is interactive with therapist and follows commands with increased time and min -mod cuing. Pt attempting to tell therapist her needs. Asking pt "yes" and "no" questions.                     Pertinent  Vitals/ Pain       Pain Assessment: Faces Faces Pain Scale: No hurt         Frequency  Min 5X/week        Progress Toward Goals  OT Goals(current goals can now be found in the care plan section)  Progress towards OT goals: Progressing toward goals  Acute Rehab OT Goals OT Goal Formulation: With patient Time For Goal Achievement: 11/28/20 Potential to Achieve Goals: Good  Plan Discharge plan remains appropriate;Frequency remains appropriate       AM-PAC OT "6 Clicks" Daily Activity     Outcome Measure   Help from another person eating meals?: A Lot Help from another person taking care of personal grooming?: A Lot Help from another person toileting, which includes using toliet, bedpan, or urinal?: A Lot Help from another person bathing (including washing, rinsing, drying)?: A Lot Help from another person to put on and taking off regular upper body clothing?: A Lot Help from another person to put on and taking off regular lower body clothing?: A Lot 6 Click Score: 12    End of Session    OT Visit Diagnosis: Other abnormalities of gait and mobility (R26.89);Hemiplegia and hemiparesis;Muscle weakness (generalized) (M62.81) Hemiplegia - Right/Left: Right Hemiplegia - caused by: Cerebral infarction   Activity Tolerance Patient tolerated treatment well   Patient Left in bed;with bed alarm set;with call bell/phone within reach   Nurse Communication Mobility status        Time: 5945-8592 OT Time Calculation (min): 29 min  Charges: OT General Charges $OT Visit: 1 Visit OT Treatments $Self Care/Home Management : 8-22 mins $Neuromuscular Re-education: 8-22 mins  Darleen Crocker, MS, OTR/L , CB[facility] ascom [phone]  11/18/20, 3:39 PM

## 2020-11-18 NOTE — Progress Notes (Signed)
Nutrition Follow-up  DOCUMENTATION CODES:   Obesity unspecified  INTERVENTION:   Continue Vital 1.5 '@60ml' /hr via NGT   Free water flushes 73m q4 hours to maintain tube patency   Regimen provides 2160kcal/day, 97g/day protein and 12848mhr of free water   Juven Fruit Punch BID via tube, each serving provides 95kcal and 2.5g of protein (amino acids glutamine and arginine)  NUTRITION DIAGNOSIS:   Increased nutrient needs related to wound healing as evidenced by estimated needs.  GOAL:   Patient will meet greater than or equal to 90% of their needs -met   MONITOR:   Labs, Weight trends, TF tolerance, Skin, I & O's  ASSESSMENT:   Patient is a [age]ear old female who presents to the ER for evaluation of frequent falls and is found to be septic from left foot osteomyelitis and also in rapid A. fib.  - Pt s/p MRI 11/2 which revealed an acute to early subacute infarction in the left posterior frontal cortical and subcortical brain, possibly affecting the precentral gyrus.  Pt remains lethargic and unable to eat. NGT placed 11/4. Pt tolerating tube feeds at goal rate. Refeed labs stable. Per chart, pt is weight stable since admission. Palliative care following for GOC.   Medications reviewed and include: allopurinol, vitamin C, colace, ferrous sulfate, insulin, synthroid, ferrous gluconate, juven, protonix, miralax, unasyn, LRS '@75ml' /hr   Labs reviewed: Na 142 wnl, K 4.4 wnl, BUN 95(H), creat 3.89(H), P 3.8 wnl, Mg 2.0 wnl Wbc- 10.6(H), Hgb 7.3(L), Hct 23.0(L) Wbcs- 185, 171, 212 x 24 hrs  Diet Order:   Diet Order             DIET - DYS 1 Room service appropriate? Yes; Fluid consistency: Nectar Thick  Diet effective now                  EDUCATION NEEDS:   No education needs have been identified at this time  Skin:  Skin Assessment: Skin Integrity Issues: Skin Integrity Issues:: Diabetic Ulcer Diabetic Ulcer: lt foot  Last BM:  11/7- TYPE 7  Height:   Ht  Readings from Last 1 Encounters:  11/09/20 '5\' 6"'  (1.676 m)    Weight:   Wt Readings from Last 1 Encounters:  11/17/20 91.8 kg    Ideal Body Weight:  59.1 kg  BMI:  Body mass index is 32.67 kg/m.  Estimated Nutritional Needs:   Kcal:  1800-2100kcal/day  Protein:  90-105g/day  Fluid:  1.5-1.8L/day  CaKoleen DistanceS, RD, LDN Please refer to AMLitch[facility] Surgery Centeror RD and/or RD on-call/weekend/after hours pager

## 2020-11-18 NOTE — Progress Notes (Signed)
   11/18/20 2027  Assess: MEWS Score  Temp 98.2 F (36.8 C)  BP (!) 141/91  Pulse Rate (!) 143  Resp 20  Level of Consciousness Alert  SpO2 96 %  O2 Device Room Air  Assess: MEWS Score  MEWS Temp 0  MEWS Systolic 0  MEWS Pulse 3  MEWS RR 0  MEWS LOC 0  MEWS Score 3  MEWS Score Color Yellow  Assess: if the MEWS score is Yellow or Red  Were vital signs taken at a resting state? Yes  Focused Assessment No change from prior assessment  Does the patient meet 2 or more of the SIRS criteria? Yes  Does the patient have a confirmed or suspected source of infection? Yes  Provider and Rapid Response Notified? Yes (Provider Notified)  MEWS guidelines implemented *See Row Information* Yes  Treat  MEWS Interventions Administered scheduled meds/treatments;Administered prn meds/treatments  Pain Scale 0-10  Pain Score 0  Take Vital Signs  Increase Vital Sign Frequency  Yellow: Q 2hr X 2 then Q 4hr X 2, if remains yellow, continue Q 4hrs  Escalate  MEWS: Escalate Yellow: discuss with charge nurse/RN and consider discussing with provider and RRT  Notify: Charge Nurse/RN  Name of Charge Nurse/RN Notified Felicia RN  Date Charge Nurse/RN Notified 11/18/20  Time Charge Nurse/RN Notified 2211  Notify: Provider  Provider Name/Title Dr Hal Hope  Date Provider Notified 11/18/20  Time Provider Notified 2025  Notification Type Page  Notification Reason Other (Comment) (HR sustaining 140)  Provider response See new orders  Date of Provider Response 11/18/20  Time of Provider Response 2030  Document  Patient Outcome Not stable and remains on department  Assess: SIRS CRITERIA  SIRS Temperature  0  SIRS Pulse 1  SIRS Respirations  0  SIRS WBC 0  SIRS Score Sum  1

## 2020-11-18 NOTE — Consult Note (Signed)
Pharmacy Antibiotic Note  ROXI HLAVATY is a 70 y.o. female admitted on 11/09/2020 with  osteomyelitis .  Pharmacy has been consulted for Unasyn dosing. ID following.   Presented with left foot draining. PMH includes liver cirrhosis d/t NASH, HTN, thyroid disease, DM.   MRI of foot c/w osteomyelitis and abscess. Cultures of left foot abscess resulted, narrowing to Unasyn as per discussion with ID.   Plan: Ampicillin/sulbactam 3 grams every 12 hours  Monitor renal function, clinical course, and LOT.  Height: 5\' 6"  (167.6 cm) Weight: 91.8 kg (202 lb 6.1 oz) IBW/kg (Calculated) : 59.3  Temp (24hrs), Avg:98.1 F (36.7 C), Min:97.5 F (36.4 C), Max:98.7 F (37.1 C)  Recent Labs  Lab 11/11/20 2122 11/12/20 0540 11/12/20 1551 11/13/20 0203 11/14/20 0526 11/15/20 0942 11/16/20 0246 11/17/20 0815 11/18/20 0227  WBC  --    < >  --    < > 14.5* 12.8* 10.8* 9.3 10.6*  CREATININE  --    < >  --    < > 4.91* 5.23* 4.79* 4.24* 3.89*  VANCORANDOM 12  --  14  --   --   --   --   --   --    < > = values in this interval not displayed.     Estimated Creatinine Clearance: 15.8 mL/min (A) (by C-G formula based on SCr of 3.89 mg/dL (H)).    Allergies  Allergen Reactions   Codeine Nausea And Vomiting    Antimicrobials this admission: 10/29 Vancomycin >> 10/31 10/29 Cefepime >> 11/01 10/29 Metronidazole >> 11/01 11/02 Zosyn >> 11/3 11/3 Unasyn >>  Microbiology results: 10/30 MRSA PCR: (-)  10/31 Wound (2nd metatarsal): strep mitis/oralis, pasteurella 10/31 Abscess (left foot): MSSA, pasteurella 10/31 L metatarsal tissue: NGTD   Thank you for allowing pharmacy to be a part of this patient's care.   Wynelle Cleveland, PharmD Pharmacy Resident  11/18/2020 12:23 PM

## 2020-11-18 NOTE — Progress Notes (Signed)
   PODIATRY PROGRESS NOTE  NAME Autumn [name] MRN [id number] DOB [birth year] DOA 11/09/2020   Patient ICU  rm 10 S/P incision and drainage.  Second ray resection LT.  DOS: 11/11/2020 with cerebral infarct postoperatively in the ICU 11/13/2020.  Patient nonverbal.  Limited commands.   ASSESSMENT/PLAN OF CARE 1.  S/P second ray amputation. I&D LT foot. DOS: 11/11/2020 -Dressings changed. -The foot continues to have heavy drainage and will likely need return to the OR with irrigation debridement possibly Wednesday if cleared from an ICU/hospitalist standpoint. -Orders placed for dressing changes every other day -Podiatry will schedule return to the OR once the patient has been stabilized -We will continue to follow          Past Medical History:  Diagnosis Date   Diabetes mellitus without complication ([place])    Hypertension    Kidney stone    Thyroid disease     CBC Latest Ref Rng & Units 11/18/2020 11/18/2020 11/17/2020  WBC 4.0 - 10.5 K/uL - 10.6(H) 9.3  Hemoglobin 12.0 - 15.0 g/dL 7.3(L) 6.4(L) 8.2(L)  Hematocrit 36.0 - 46.0 % 23.0(L) 20.5(L) 26.2(L)  Platelets 150 - 400 K/uL - 124(L) 134(L)    BMP Latest Ref Rng & Units 11/18/2020 11/17/2020 11/16/2020  Glucose 70 - 99 mg/dL 194(H) 175(H) 166(H)  BUN 8 - 23 mg/dL 95(H) 97(H) 99(H)  Creatinine 0.44 - 1.00 mg/dL 3.89(H) 4.24(H) 4.79(H)  Sodium 135 - 145 mmol/L 143 139 139  Potassium 3.5 - 5.1 mmol/L 4.4 4.1 3.9  Chloride 98 - 111 mmol/L 106 102 101  CO2 22 - 32 mmol/L 26 28 25   Calcium 8.9 - 10.3 mg/dL 8.5(L) 8.5(L) 8.2(L)    Please contact me directly with any questions or concerns.  Cell Jay, [place]     [address], [place] [address]                Office [phone]  Fax [phone]

## 2020-11-18 NOTE — Progress Notes (Signed)
Dr. Hal Hope at bedside to assess patients bleeding from left foot. MD states that he will consult with neurology. Continue to monitor patient closely.

## 2020-11-18 NOTE — Progress Notes (Addendum)
Pt ox2. Difficult to determine full extent of neuro status d/t expressive aphasia. Pt follows all simple commands. Moves all 4 ext's.   @ outset of shift, increased bleeding noted from left foot wound. Clots noted on pad underneath foot. Heparin gtt held since 0400. Over course of day, bleeding and drainage seems to have improved, not having to change pads out as frequently. Podiatry team changed dressing near 1800, see note. Pt does not endorse pain.   1 unit of blood transfused @ outset of shift. Hgb recheck @ 1200 was 7.3 (previously 6.4). Dr. Florene Glen notified, 2nd unit of blood ordered and in process of transfusing upon tx to floor.  Tube feeds continue to run t/o day. Rectal tube remains in place, putting out brown liquid stool.   Pt worked w/ PT and OT.   Near 1800, pt tx'd to 2A.   HR continues to remain in 110s-130s AFIB. HR increases w/ exertion.   Pt weaned to RA midshift.   Foley remains in place, putting out adequate urine, clear yellow.   Turn q2. Pt in no acute distress upon transfer to floor.

## 2020-11-18 NOTE — Progress Notes (Addendum)
Dr. Hal Hope notified that the patients left foot has been bleeding bright red blood, soaked through the dressing and Hgb has dropped from 8.2 to 6.4. Patient is also currently on a Heparin gtt that was resumed on 11/5 (per report from day shift RN, Vascular and Cardiology were notified about bleeding 11/17/20). Acknowledged, new order entered to transfuse 1 unit of pRBC's
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
## 2020-11-18 NOTE — Progress Notes (Signed)
Date of Admission:  11/09/2020   T   ID: Autumn [name] is a 70 y.o. female  Principal Problem:   Sepsis ([place]) Active Problems:   Atrial fibrillation with RVR ([place])   Osteomyelitis of foot, left, acute ([place])   CKD stage 4 due to type 2 diabetes mellitus ([place])   Liver cirrhosis secondary to NASH (nonalcoholic steatohepatitis) ([place])   Hypertension   History of anemia due to CKD   Frequent falls   AKI (acute kidney injury) ([place])   Cellulitis and abscess of foot, except toes   Infectious tenosynovitis   [place]   Atrial fibrillation with rapid ventricular response ([facility])    Subjective: Pt awake  Expressive aphasia Over the weekend the left foot surgical site was bleeding since she was started on IV heparin Podiatrist saw her today and plan to take her back to OR on wednesday if medically stable She is getting blood transfusion  Medications:   sodium chloride   Intravenous Once   allopurinol  200 mg Per Tube Daily   vitamin C  500 mg Per Tube BID   budesonide (PULMICORT) nebulizer solution  0.5 mg Nebulization BID   Chlorhexidine Gluconate Cloth  6 each Topical Daily   diltiazem  60 mg Per Tube Q6H   docusate  100 mg Per Tube BID   ferrous gluconate  324 mg Oral Q breakfast   free water  30 mL Per Tube Q4H   insulin aspart  0-6 Units Subcutaneous Q4H   ipratropium-albuterol  3 mL Nebulization TID   levothyroxine  100 mcg Per Tube Q0600   mouth rinse  15 mL Mouth Rinse BID   metoprolol tartrate  50 mg Per Tube BID   nutrition supplement (JUVEN)  1 packet Per Tube BID BM   pantoprazole (PROTONIX) IV  40 mg Intravenous Daily   polyethylene glycol  17 g Per Tube Daily   venlafaxine  100 mg Per Tube BID    Objective: Vital signs in last 24 hours: Temp:  [97.5 F (36.4 C)-98.4 F (36.9 C)] 98 F (36.7 C) (11/07 1816) Pulse Rate:  [25-146] 142 (11/07 1816) Resp:  [14-34] 17 (11/07 1816) BP: (99-154)/(68-117) 136/106 (11/07 1816) SpO2:  [90 %-100 %] 96 % (11/07  1816)  PHYSICAL EXAM:  General: awake Aphasia Rt hemiparesis  Lungs: b/l air entry- decreased in the bases Heart: irregular. Abdomen: Soft, non-tender,not distended. Bowel sounds normal. No masses Extremities: left foot dressing not removed     Skin: No rashes or lesions. Or bruising Lymph: Cervical, supraclavicular normal. Neurologic: rt hemiparesis  Lab Results Recent Labs    11/17/20 0815 11/18/20 0227 11/18/20 1237  WBC 9.3 10.6*  --   HGB 8.2* 6.4* 7.3*  HCT 26.2* 20.5* 23.0*  NA 139 143  --   K 4.1 4.4  --   CL 102 106  --   CO2 28 26  --   BUN 97* 95*  --   CREATININE 4.24* 3.89*  --    Liver Panel Recent Labs    11/17/20 0815 11/18/20 0227  PROT 5.8* 5.0*  ALBUMIN 2.3* 2.0*  AST 19 19  ALT 18 15  ALKPHOS 66 54  BILITOT 0.7 0.8   Sedimentation Rate No results for input(s): ESRSEDRATE in the last 72 hours. C-Reactive Protein No results for input(s): CRP in the last 72 hours.  Microbiology: MRSA nares  not detected 11/13/20 BC Foot abscess culture Pasteurella, rare staph and strep mitis, peptostreptococcus, bacteroides Studies/Results: DG  Abd Portable 1V  Result Date: 11/17/2020 CLINICAL DATA:  Check gastric catheter placement EXAM: PORTABLE ABDOMEN - 1 VIEW COMPARISON:  11/15/2020 FINDINGS: Weighted feeding catheter is again noted in the distal stomach directed towards the pool or so. No free air is seen. IMPRESSION: Weighted feeding catheter in the distal stomach. Electronically Signed   By: Inez Catalina M.D.   On: 11/17/2020 19:21     Assessment/Plan:  Resp distress - was intubated, now extubated and is out of ICU  Altered mental status due to CVA- much better MRI shows Acute to early subacute infarction in the left posterior frontal cortical and subcortical brain, possibly affecting the precentral gyrus. Has rt hemiparesis and expressive aphasia  Diabetes mellitus with Left foot ulcer with infection and sepsis  underwent I/D and 2nd ray  excision polymicrobial Culture MSSA, pasteurella, strep mitis On unasyn  Left foot surgical site bleeding secondary to heparin IV which has been discontinued Getting PRBC    Afib-RVR on diltiazem and metoprolol-followed by cardiology Severe anemia- got blood transfusion  AKI on CKD- worsening Avoid nephrotoxic drugs   DM    NASH leading to cirrhosis Rt heart failure   Hypothyroidism on synthroid-     Discussed the management with patient and care team

## 2020-11-18 NOTE — Progress Notes (Signed)
Progress Note  Patient Name: Autumn [name] Date of Encounter: 11/18/2020  [facility] Cardiologist: Dr. Saunders Revel  Subjective   The patient continues to be aphasic and thus obtaining history is difficult.  She had significant continuous bleeding from the left foot wound with drop of hemoglobin to 6.2.  Heparin drip was discontinued as a result and she is currently getting blood transfusion.  Inpatient Medications    Scheduled Meds:  sodium chloride   Intravenous Once   allopurinol  200 mg Per Tube Daily   vitamin C  500 mg Per Tube BID   budesonide (PULMICORT) nebulizer solution  0.5 mg Nebulization BID   Chlorhexidine Gluconate Cloth  6 each Topical Daily   diltiazem  60 mg Per Tube Q6H   docusate  100 mg Per Tube BID   feeding supplement (NEPRO CARB STEADY)  237 mL Per Tube TID BM   ferrous gluconate  324 mg Oral Q breakfast   free water  30 mL Per Tube Q4H   insulin aspart  0-6 Units Subcutaneous Q4H   ipratropium-albuterol  3 mL Nebulization TID   levothyroxine  100 mcg Per Tube Q0600   mouth rinse  15 mL Mouth Rinse BID   metoprolol tartrate  50 mg Per Tube BID   multivitamin with minerals  1 tablet Per Tube Daily   nutrition supplement (JUVEN)  1 packet Per Tube BID BM   pantoprazole (PROTONIX) IV  40 mg Intravenous Daily   polyethylene glycol  17 g Per Tube Daily   venlafaxine  100 mg Per Tube BID   Continuous Infusions:  sodium chloride 5 mL/hr at 11/18/20 0700   ampicillin-sulbactam (UNASYN) IV Stopped (11/17/20 2346)   feeding supplement (VITAL 1.5 CAL) 1,000 mL (11/18/20 0531)   heparin Stopped (11/18/20 0355)   lactated ringers 75 mL/hr at 11/18/20 0700   PRN Meds: sodium chloride, [DISCONTINUED] acetaminophen **OR** acetaminophen, acetaminophen, albuterol, diltiazem, meclizine, ondansetron **OR** ondansetron (ZOFRAN) IV   Vital Signs    Vitals:   11/18/20 0700 11/18/20 0800 11/18/20 0810 11/18/20 0825  BP: (!) 130/92 116/74 116/74 (!) 123/109   Pulse: (!) 50 (!) 146 (!) 41 (!) 102  Resp: 17 18 (!) 21 19  Temp:   (!) 97.5 F (36.4 C) (!) 97.5 F (36.4 C)  TempSrc:   Oral Oral  SpO2: 100% 99% 100% 100%  Weight:      Height:        Intake/Output Summary (Last 24 hours) at 11/18/2020 0838 Last data filed at 11/18/2020 0700 Gross per 24 hour  Intake 3692.12 ml  Output 1475 ml  Net 2217.12 ml    Last 3 Weights 11/17/2020 11/16/2020 11/09/2020  Weight (lbs) 202 lb 6.1 oz 202 lb 2.6 oz 190 lb  Weight (kg) 91.8 kg 91.7 kg 86.183 kg      Telemetry    Atrial fibrillation, heart rate between 100 to 145 bpm- Personally Reviewed  ECG     - Personally Reviewed  Physical Exam   GEN: Somnolent, soft-spoken Neck: No JVD, NG tube noted Cardiac: Irregular irregular Respiratory: Diminished breath sounds at bases GI: Soft, nontender, non-distended  MS: No edema; left foot in dressing Neuro: Unable to assess, generalized weakness, worse on right arm. Psych: Unable to assess Bleeding noted from the left foot wound.  Labs    High Sensitivity Troponin:   Recent Labs  Lab 11/09/20 1220 11/09/20 1818 11/10/20 0115 11/10/20 0649  TROPONINIHS 147* 151* 162* 149*  Chemistry Recent Labs  Lab 11/16/20 0246 11/17/20 0815 11/18/20 0227  NA 139 139 143  K 3.9 4.1 4.4  CL 101 102 106  CO2 25 28 26   GLUCOSE 166* 175* 194*  BUN 99* 97* 95*  CREATININE 4.79* 4.24* 3.89*  CALCIUM 8.2* 8.5* 8.5*  MG 2.4 2.1 2.0  PROT 5.6* 5.8* 5.0*  ALBUMIN 2.1* 2.3* 2.0*  AST 19 19 19   ALT 18 18 15   ALKPHOS 59 66 54  BILITOT 0.9 0.7 0.8  GFRNONAA 9* 11* 12*  ANIONGAP 13 9 11      Lipids  Recent Labs  Lab 11/14/20 0526  TRIG 213*     Hematology Recent Labs  Lab 11/16/20 0246 11/17/20 0815 11/18/20 0227  WBC 10.8* 9.3 10.6*  RBC 2.92* 2.95* 2.30*  HGB 8.0* 8.2* 6.4*  HCT 25.2* 26.2* 20.5*  MCV 86.3 88.8 89.1  MCH 27.4 27.8 27.8  MCHC 31.7 31.3 31.2  RDW 15.9* 15.9* 16.0*  PLT 168 134* 124*    Thyroid  No  results for input(s): TSH, FREET4 in the last 168 hours.   BNPNo results for input(s): BNP, PROBNP in the last 168 hours.  DDimer No results for input(s): DDIMER in the last 168 hours.   Radiology    DG Abd Portable 1V  Result Date: 11/17/2020 CLINICAL DATA:  Check gastric catheter placement EXAM: PORTABLE ABDOMEN - 1 VIEW COMPARISON:  11/15/2020 FINDINGS: Weighted feeding catheter is again noted in the distal stomach directed towards the pool or so. No free air is seen. IMPRESSION: Weighted feeding catheter in the distal stomach. Electronically Signed   By: Inez Catalina M.D.   On: 11/17/2020 19:21    Cardiac Studies   Echo 10/2020 1. Left ventricular ejection fraction, by estimation, is 55 to 60%. The  left ventricle has normal function. The left ventricle has no regional  wall motion abnormalities. There is mild concentric left ventricular  hypertrophy. Left ventricular diastolic  parameters are indeterminate. Elevated left ventricular end-diastolic  pressure.   2. Right ventricular systolic function is normal. The right ventricular  size is normal. There is normal pulmonary artery systolic pressure.   3. Left atrial size was severely dilated.   4. The mitral valve is normal in structure. Trivial mitral valve  regurgitation. No evidence of mitral stenosis.   5. The aortic valve is tricuspid. Aortic valve regurgitation is not  visualized. No aortic stenosis is present.   6. The inferior vena cava is normal in size with greater than 50%  respiratory variability, suggesting right atrial pressure of 3 mmHg.   7. There is a small patent foramen ovale.   Patient Profile     70 y.o. female with history of HFpEF, CKD 4, cirrhosis, smoker x50+ years, admitted with left foot osteomyelitis, being seen for atrial fibrillation with rapid ventricular response.  Hospital course complicated by respiratory failure and acute stroke.  Assessment & Plan   1.  A. fib RVR -Ventricular rate is not  optimal over the last 24 hours likely worsened by blood loss anemia. Continue diltiazem at current dose but I elected to increase metoprolol to 50 mg twice daily. -Echo with preserved EF I agree with holding heparin drip for now given active bleeding from the left foot wound with significant drop in hemoglobin.  We will assess the safety of resuming heparin once the bleeding from the left foot is addressed. Ultimately, the patient will require oral anticoagulation but options might be limited to warfarin given advanced  chronic kidney disease as well as underlying liver disease.  2.  Acute stroke -Possibly embolic due to A. Fib -Heparin drip is currently on hold due to active bleeding.   3.  Left foot osteomyelitis -Continuous bleeding noted with a drop in hemoglobin. -Antibiotics as per primary team -Possible debridement planned next week by podiatry.         Signed, Kathlyn Sacramento, MD  11/18/2020, 8:38 AM

## 2020-11-19 ENCOUNTER — Inpatient Hospital Stay: Payer: Medicare (Managed Care)

## 2020-11-19 DIAGNOSIS — A419 Sepsis, unspecified organism: Secondary | ICD-10-CM | POA: Diagnosis not present

## 2020-11-19 DIAGNOSIS — M86172 Other acute osteomyelitis, left ankle and foot: Secondary | ICD-10-CM | POA: Diagnosis not present

## 2020-11-19 DIAGNOSIS — R6520 Severe sepsis without septic shock: Secondary | ICD-10-CM | POA: Diagnosis not present

## 2020-11-19 DIAGNOSIS — L03119 Cellulitis of unspecified part of limb: Secondary | ICD-10-CM | POA: Diagnosis not present

## 2020-11-19 DIAGNOSIS — N179 Acute kidney failure, unspecified: Secondary | ICD-10-CM | POA: Diagnosis not present

## 2020-11-19 DIAGNOSIS — I4891 Unspecified atrial fibrillation: Secondary | ICD-10-CM | POA: Diagnosis not present

## 2020-11-19 DIAGNOSIS — N289 Disorder of kidney and ureter, unspecified: Secondary | ICD-10-CM | POA: Diagnosis not present

## 2020-11-19 LAB — TYPE AND SCREEN
ABO/RH(D): O POS
Antibody Screen: NEGATIVE
Unit division: 0
Unit division: 0

## 2020-11-19 LAB — COMPREHENSIVE METABOLIC PANEL
ALT: 16 U/L (ref 0–44)
AST: 19 U/L (ref 15–41)
Albumin: 2.1 g/dL — ABNORMAL LOW (ref 3.5–5.0)
Alkaline Phosphatase: 57 U/L (ref 38–126)
Anion gap: 9 (ref 5–15)
BUN: 79 mg/dL — ABNORMAL HIGH (ref 8–23)
CO2: 25 mmol/L (ref 22–32)
Calcium: 8.8 mg/dL — ABNORMAL LOW (ref 8.9–10.3)
Chloride: 107 mmol/L (ref 98–111)
Creatinine, Ser: 3.36 mg/dL — ABNORMAL HIGH (ref 0.44–1.00)
GFR, Estimated: 14 mL/min — ABNORMAL LOW (ref >60)
Glucose, Bld: 136 mg/dL — ABNORMAL HIGH (ref 70–99)
Potassium: 4.6 mmol/L (ref 3.5–5.1)
Sodium: 141 mmol/L (ref 135–145)
Total Bilirubin: 0.8 mg/dL (ref 0.3–1.2)
Total Protein: 4.9 g/dL — ABNORMAL LOW (ref 6.5–8.1)

## 2020-11-19 LAB — BPAM RBC
Blood Product Expiration Date: 202212132359
Blood Product Expiration Date: 202212132359
ISSUE DATE / TIME: 202211070758
ISSUE DATE / TIME: 202211071701
Unit Type and Rh: 5100
Unit Type and Rh: 5100

## 2020-11-19 LAB — CBC WITH DIFFERENTIAL/PLATELET
Abs Immature Granulocytes: 0.58 10*3/uL — ABNORMAL HIGH (ref 0.00–0.07)
Basophils Absolute: 0.1 10*3/uL (ref 0.0–0.1)
Basophils Relative: 0 %
Eosinophils Absolute: 0.2 10*3/uL (ref 0.0–0.5)
Eosinophils Relative: 1 %
HCT: 26.6 % — ABNORMAL LOW (ref 36.0–46.0)
Hemoglobin: 8.5 g/dL — ABNORMAL LOW (ref 12.0–15.0)
Immature Granulocytes: 5 %
Lymphocytes Relative: 11 %
Lymphs Abs: 1.4 10*3/uL (ref 0.7–4.0)
MCH: 29.2 pg (ref 26.0–34.0)
MCHC: 32 g/dL (ref 30.0–36.0)
MCV: 91.4 fL (ref 80.0–100.0)
Monocytes Absolute: 0.7 10*3/uL (ref 0.1–1.0)
Monocytes Relative: 6 %
Neutro Abs: 9.6 10*3/uL — ABNORMAL HIGH (ref 1.7–7.7)
Neutrophils Relative %: 77 %
Platelets: 104 10*3/uL — ABNORMAL LOW (ref 150–400)
RBC: 2.91 MIL/uL — ABNORMAL LOW (ref 3.87–5.11)
RDW: 15.9 % — ABNORMAL HIGH (ref 11.5–15.5)
Smear Review: NORMAL
WBC: 12.5 10*3/uL — ABNORMAL HIGH (ref 4.0–10.5)
nRBC: 0.3 % — ABNORMAL HIGH (ref 0.0–0.2)

## 2020-11-19 LAB — PHOSPHORUS: Phosphorus: 3.8 mg/dL (ref 2.5–4.6)

## 2020-11-19 LAB — GLUCOSE, CAPILLARY
Glucose-Capillary: 125 mg/dL — ABNORMAL HIGH (ref 70–99)
Glucose-Capillary: 137 mg/dL — ABNORMAL HIGH (ref 70–99)
Glucose-Capillary: 141 mg/dL — ABNORMAL HIGH (ref 70–99)
Glucose-Capillary: 160 mg/dL — ABNORMAL HIGH (ref 70–99)
Glucose-Capillary: 176 mg/dL — ABNORMAL HIGH (ref 70–99)
Glucose-Capillary: 203 mg/dL — ABNORMAL HIGH (ref 70–99)

## 2020-11-19 LAB — MAGNESIUM: Magnesium: 2.1 mg/dL (ref 1.7–2.4)

## 2020-11-19 MED ORDER — SODIUM CHLORIDE 0.9 % IV SOLN
250.0000 mL | INTRAVENOUS | Status: DC | PRN
  Administered 2020-11-26 – 2020-12-06 (×2): 250 mL via INTRAVENOUS

## 2020-11-19 MED ORDER — SODIUM CHLORIDE 0.9% FLUSH: 3.0000 mL | INTRAVENOUS | Status: DC | PRN

## 2020-11-19 MED ORDER — SODIUM CHLORIDE 0.9% FLUSH
3.0000 mL | Freq: Two times a day (BID) | INTRAVENOUS | Status: DC
  Administered 2020-11-19 – 2020-12-21 (×52): 3 mL via INTRAVENOUS

## 2020-11-19 MED ORDER — METOPROLOL TARTRATE 50 MG PO TABS
50.0000 mg | ORAL_TABLET | Freq: Four times a day (QID) | ORAL | Status: DC
  Administered 2020-11-19 – 2020-11-20 (×6): 50 mg
  Filled 2020-11-19 (×6): qty 1

## 2020-11-19 NOTE — Progress Notes (Signed)
Progress Note  Patient Name: Autumn [name] Date of Encounter: 11/19/2020  [facility] Cardiologist: Dr. Saunders Revel  Subjective   Speech improving. No chest pain or dyspnea. She remains off heparin gtt given drop of hemoglobin to 6.2, now 8.5 s/p 2 units of pRBC.  Carotid artery ultrasound demonstrated a large amount of right sided plaque and moderate left sided atherosclerosis. Vascular surgery has been consulted. She remains in Afib with ventricular rates in the low 100s to 120s bpm.   Inpatient Medications    Scheduled Meds:  sodium chloride   Intravenous Once   allopurinol  200 mg Per Tube Daily   vitamin C  500 mg Per Tube BID   budesonide (PULMICORT) nebulizer solution  0.5 mg Nebulization BID   Chlorhexidine Gluconate Cloth  6 each Topical Daily   diltiazem  60 mg Per Tube Q6H   docusate  100 mg Per Tube BID   ferrous gluconate  324 mg Oral Q breakfast   free water  30 mL Per Tube Q4H   insulin aspart  0-6 Units Subcutaneous Q4H   ipratropium-albuterol  3 mL Nebulization TID   levothyroxine  100 mcg Per Tube Q0600   mouth rinse  15 mL Mouth Rinse BID   metoprolol tartrate  50 mg Per Tube BID   nutrition supplement (JUVEN)  1 packet Per Tube BID BM   pantoprazole (PROTONIX) IV  40 mg Intravenous Daily   polyethylene glycol  17 g Per Tube Daily   sodium chloride flush  3 mL Intravenous Q12H   venlafaxine  100 mg Per Tube BID   Continuous Infusions:  sodium chloride 5 mL/hr at 11/18/20 0700   sodium chloride     ampicillin-sulbactam (UNASYN) IV 3 g (11/19/20 1048)   feeding supplement (VITAL 1.5 CAL) 1,000 mL (11/18/20 0531)   lactated ringers 75 mL/hr at 11/19/20 0049   PRN Meds: sodium chloride, sodium chloride, [DISCONTINUED] acetaminophen **OR** acetaminophen, acetaminophen, albuterol, diltiazem, meclizine, ondansetron **OR** ondansetron (ZOFRAN) IV, sodium chloride flush   Vital Signs    Vitals:   11/19/20 0412 11/19/20 0839 11/19/20 0849 11/19/20 1115  BP:  (!) 147/118 (!) 126/115  (!) 144/109  Pulse: (!) 147 (!) 109 (!) 138 (!) 122  Resp: 16 18 20 18   Temp: 98.4 F (36.9 C) 98.1 F (36.7 C)  97.9 F (36.6 C)  TempSrc:      SpO2:  98% 94% 96%  Weight: 95.6 kg     Height:        Intake/Output Summary (Last 24 hours) at 11/19/2020 1141 Last data filed at 11/19/2020 0630 Gross per 24 hour  Intake 2084.06 ml  Output 750 ml  Net 1334.06 ml    Last 3 Weights 11/19/2020 11/17/2020 11/16/2020  Weight (lbs) 210 lb 12.2 oz 202 lb 6.1 oz 202 lb 2.6 oz  Weight (kg) 95.6 kg 91.8 kg 91.7 kg      Telemetry    Atrial fibrillation with ventricular rates in the low 100s to 120s bpm- Personally Reviewed  ECG    No new tracings - Personally Reviewed  Physical Exam   GEN: Somnolent, soft-spoken Neck: No JVD, NG tube noted Cardiac: Irregular irregular Respiratory: Diminished breath sounds at bases GI: Soft, nontender, non-distended  MS: No edema; left foot in dressing Neuro: Unable to assess, generalized weakness, worse on right arm. Psych: Unable to assess  Labs    High Sensitivity Troponin:   Recent Labs  Lab 11/09/20 1220 11/09/20 1818 11/10/20 0115 11/10/20  [place]      Chemistry Recent Labs  Lab 11/17/20 0815 11/18/20 0227 11/19/20 0543  NA 139 143 141  K 4.1 4.4 4.6  CL 102 106 107  CO2 28 26 25   GLUCOSE 175* 194* 136*  BUN 97* 95* 79*  CREATININE 4.24* 3.89* 3.36*  CALCIUM 8.5* 8.5* 8.8*  MG 2.1 2.0 2.1  PROT 5.8* 5.0* 4.9*  ALBUMIN 2.3* 2.0* 2.1*  AST 19 19 19   ALT 18 15 16   ALKPHOS 66 54 57  BILITOT 0.7 0.8 0.8  GFRNONAA 11* 12* 14*  ANIONGAP 9 11 9      Lipids  Recent Labs  Lab 11/14/20 0526  TRIG 213*     Hematology Recent Labs  Lab 11/17/20 0815 11/18/20 0227 11/18/20 1237 11/18/20 2038 11/19/20 0543  WBC 9.3 10.6*  --   --  12.5*  RBC 2.95* 2.30*  --   --  2.91*  HGB 8.2* 6.4* 7.3* 9.0* 8.5*  HCT 26.2* 20.5* 23.0* 27.4* 26.6*  MCV 88.8 89.1  --   --  91.4   MCH 27.8 27.8  --   --  29.2  MCHC 31.3 31.2  --   --  32.0  RDW 15.9* 16.0*  --   --  15.9*  PLT 134* 124*  --   --  104*    Thyroid  No results for input(s): TSH, FREET4 in the last 168 hours.   BNPNo results for input(s): BNP, PROBNP in the last 168 hours.  DDimer No results for input(s): DDIMER in the last 168 hours.   Radiology    US Carotid Bilateral  Result Date: 11/19/2020 IMPRESSION: 1. Large amount of right-sided atherosclerotic plaque results in a sonographic string sign and markedly blunted monophasic waveform involving the proximal aspect of the right internal carotid artery worrisome for a subtotal occlusion. Further evaluation with CTA could be performed as indicated. 2. Moderate to large amount of left-sided atherosclerotic plaque, not definitely resulting in a hemodynamically significant stenosis within the left internal carotid artery, though note, velocity measurements may be unreliable in the setting of a suspected contralateral subtotal occlusion. Again, this could be further evaluated with CTA as indicated. 3. Antegrade flow demonstrated within the bilateral vertebral arteries. These results will be called to the ordering clinician or representative by the Radiologist Assistant, and communication documented in the PACS or [employer]. Electronically Signed   By: Sandi Mariscal M.D.   On: 11/19/2020 08:36   DG Abd Portable 1V  Result Date: 11/17/2020 IMPRESSION: Weighted feeding catheter in the distal stomach. Electronically Signed   By: Inez Catalina M.D.   On: 11/17/2020 19:21    Cardiac Studies   Echo 10/2020 1. Left ventricular ejection fraction, by estimation, is 55 to 60%. The  left ventricle has normal function. The left ventricle has no regional  wall motion abnormalities. There is mild concentric left ventricular  hypertrophy. Left ventricular diastolic  parameters are indeterminate. Elevated left ventricular end-diastolic  pressure.   2. Right  ventricular systolic function is normal. The right ventricular  size is normal. There is normal pulmonary artery systolic pressure.   3. Left atrial size was severely dilated.   4. The mitral valve is normal in structure. Trivial mitral valve  regurgitation. No evidence of mitral stenosis.   5. The aortic valve is tricuspid. Aortic valve regurgitation is not  visualized. No aortic stenosis is present.   6. The inferior vena cava is normal in  size with greater than 50%  respiratory variability, suggesting right atrial pressure of 3 mmHg.   7. There is a small patent foramen ovale.   Patient Profile     70 y.o. female with history of HFpEF, CKD 4, cirrhosis, smoker x50+ years, admitted with left foot osteomyelitis, being seen for atrial fibrillation with rapid ventricular response.  Hospital course complicated by respiratory failure and acute stroke.  Assessment & Plan   1.  A. fib RVR -Ventricular rate remains suboptimally controlled, likely exacerbated by her acute blood loss anemia -Titrate metoprolol to 50 mg q 6 hours -Continue diltiazem at current dose  -Echo with preserved EF -Not a good candidate for digoxin given renal dysfunction and would like to avoid IV amiodarone for rate control given she has been been anticoagulated without interruption  -Continue to hold heparin drip for now given active bleeding from the left foot wound with significant drop in hemoglobin that required 2 units of pRBC -Recommend resumption of heparin once the bleeding from the left foot is addressed, if safely possible -Ultimately, the patient will require oral anticoagulation but options might be limited to warfarin given advanced chronic kidney disease as well as underlying liver disease  2.  Acute stroke: -Possibly embolic due to A. Fib -Heparin drip is currently on hold due to active bleeding -Carotid artery disease work up per vascular surgery    3.  Left foot osteomyelitis: -Continuous bleeding  noted with a drop in hemoglobin -Antibiotics as per primary team -Possible debridement planned next week by podiatry.       Signed, Christell Faith, PA-C  11/19/2020, 11:41 AM

## 2020-11-19 NOTE — Progress Notes (Signed)
While rounding on patient, RN found patient had safety mitts off and Cortrak tube lying on bed next to patient. She was unable to explain what happened. On Call MD notified, Charge Nurse [facility] notified.

## 2020-11-19 NOTE — Progress Notes (Addendum)
PT Cancellation Note  Patient Details Name: Autumn [name] MRN: [id number] DOB: 22-Jul-1950   Cancelled Treatment:    Reason Eval/Treat Not Completed: Patient at procedure or test/unavailable Pt not in room during x2 attempt on this day. PT to reassess as able.  [employer], SPT

## 2020-11-19 NOTE — Progress Notes (Addendum)
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  She'd been following with Autumn [name] [facility] podiatrist outpatient and had recently refused surgery.  She'd recently been treated with antibiotics at [facility] for the diabetic foot infection with enterobacter aerogans culture positive.  She was admitted on 10/29 due to falls at home with worsening L foot chronic diabetic ulcer with osteomyelitis of the 2nd and 3rd metatarsals and cellulitis noted on MRI.  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  Her post operative course was complicated by unresponsiveness and neurologic changes, she was found to have Autumn [name] stroke and was intubated for airway protection.  She was extubated 11/3 and transferred to [facility] service on 11/4.  See below for additional details 10/29: presented to [facility] ER s/p multiple falls, 5-weeks duration left foot pain, swelling, redness, hit head x2 during fall. ER workup: EKG showed rapid afib, CT head unremarkable, [place] Left LE no DVT, CXR unremarkable, XR L Foot concerning for osteomyelitis 2nd + 3rd metatarsals with soft tissue swelling of foot and ankle. Also with elevated BNP, elevated troponin, rapid Afib, concern for early sepsis, elevated BUN/cr acute renal failure on CKD (baseline Cr 3.19).  10/29: Admitted to floor, started on cardizem infusion for rate control,continuous heparin infusion for anticoagulation in setting of afib. IV Cefepime, vancomycin, flagyl initiated. Podiatry, cardiology, nephrology consulted. MRI left foot: septic arthritis of the second MTP joint and osteomyelitis of the second metatarsal and second proximal phalanx also with concern for an abscess and  cellulitis 10/31: Surgery completed: Left foot Amputation 2nd metatarsal and toe, I&D deep abscess multiple fascial planes, bone biopsy open deep third metatarsal. Note: Purulence found tracking up along the extensor tendons 11/1: ID consulted for infection mgmt; antibiotics adjusted to zosyn 11/2: Unable to complete MRI foot due to agitation despite IV ativan administration. Developed new confusion per RN which progressively worsened over the morning. Became unresponsive after coughing episode during pill swallowing; rapid response paged, stroke alert paged.  11/2: Transferred to ICU rm 10 after CT Head per stroke alert. Patient awake, attempting to follow simple commands however unable to lift arms on command. Tracking, responds to voice; however speech is garbled and unintelligible. When asked if in pain, patient nods and speaks but garbled. + oral secretions, concern for airway protection and patient was emergently intubated.  Notified patient's contact person Autumn [name], who arrived post intubation.  11/2: MRI Brain showed acute to early subacute infarction in the left posterior frontal cortical and subcortical brain, possibly affecting the precentral gyrus. There was no mass effect or hemorrhage. EEG with moderate diffuse slowing and occasional brief periods of intervening suppression, both indicative of cerebral dysfunction, medication effect, or both. No electrographic seizures or epileptiform discharges seen.  11/3: Remains MV, on propofol and fentanyl gtts. Turns head to voice, following limited commands.  11/3 extubated 11/4 TRH taking over 11/7 acute blood loss anemia, heparin d/c'd 11/8 vascular c/s for abnormal carotid US  Assessment & Plan:   Principal Problem:   Sepsis ([place]) Active Problems:   Atrial fibrillation with RVR (HCC)   Osteomyelitis of foot, left, acute (HCC)   CKD stage  4 due to type 2 diabetes mellitus (HCC)   Liver cirrhosis secondary to NASH (nonalcoholic  steatohepatitis) ([place])   Hypertension   History of anemia due to CKD   Frequent falls   AKI (acute kidney injury) ([place])   Cellulitis and abscess of foot, except toes   Infectious tenosynovitis   Wheeze   Atrial fibrillation with rapid ventricular response (HCC)  Goals of care She's Autumn [name] bit difficult to understand with expressive aphasia, but today expressed desire to be Autumn [name] part of decision making processes with regards to her care.  She demonstrates understanding of topics I discuss with her and needs more time to find her words with her aphasia.  Acute Blood Loss Anemia In setting of heparin and recent surgical procedure - postop bleeding from surgical wound 2 units pRBC ordered Hb 8.5 today, improved, follow  Labs with iron def Follow B12 elevated, folate wnl  Stroke Neurology suspects periprocedural vs related to atrial fibrillation MRI brian with acute to early subacute infarction in the L posterior frontal cortical and subcortical brain, possibly affecting the precentral gyrus.  No mass effect or hemorrhage.  Chronic small vessel ischemic changes, some with chronic punctate hemosiderin deposition. Echo with EF 55-60%, dilated LA, patent foramen ovale carotid US with large amount of R sided atherosclerotic plaque results in Autumn [name] sonographic string sign and markedly blunted monophasic waveform involving the proximal aspect of the right ICA artery worrisome for Autumn [name] subtotal occlusion.  Moderate to large amount of L sided atheroscelotic plaque, not definitely resulting in Autumn [name] hemodynamically significant stenosis within the L ICA.  Antegrade flow within bilateral vertebral arteries. Will eval further with MRA head/neck without contrast C/s vascular  PT/OT/SLP Per neurology, benefits of resuming heparin likely outweigh risks of hemorrhagic conversion or hypertensive hemorrhage - heparin on hold - bleeding 11/7 noted - discussed with podiatry ok with resumption - will consider resumption 11/9 (being  worked up for HIT, of note) In setting of severe carotid stenosis, I think plan for back to OR with podiatry will need to be on hold until this is addressed.  Addendum, discussed further with podiatry and likely to defer any further intervention during this hospitalization.  Acute Metabolic Encephalopathy Related to acute infection and above Delirium precautions Follow and w/u further as indicated  Septic Shock Diabetic Foot Infection Left Foot Osteomyelitis and Cellulitis  Septic Arthritis MRI foot with septic arthritis of second MTP joint and osteomyelitis of 2nd metatarsal head and second proximal phalanx, severe soft tissue swelling around second metatarsal neck concerning for phlegmon developing abscess measuring 2.1x1 cm circumferentially around distal shaft.  Mild early osteomyelitis of 3rd metatarsal head.  Severe soft tissue edema of the forefoot c/w cellulitis. MRI ankle limited -> interval resection of 2nd metatarsal, longitudinal tear of peroneus brevis with peroneus tenosynovitis, distal tibialis posterior tenosynovitis and tendinopathy, extensor digitorum tenosynovitis.  Extensive subcutaneous edema circumferentially in distal calf.  Dorsal subcutaneous edema in foot. S/p 2nd ray amputation, I&D, and bone bx of 3rd metatarsal on 11/1 Blood cx 11/2 NGx2 Surgical cx and path reports as below 10/31 wound L foot abscess - rare staph aureus and rare pasteurella, mixed anaerobic flora 10/31 wound 2nd metatarsal - rare trep mitis/oralis, rare pasteurella multocida, abundant bacteroides, rare staph aureus 10/31 wound L metatarsal - rare bacteroides, rare peptostretococcus micros 10/31 path notable for metatarsal head, L third, bx - benign bone with reactive changes, negative for active inflammation, negative for malignancy 10/31 metatarsal second left amputation - acute osteo, present at  tissue edges, negative for malignancy Transitioned to unasyn per ID, appreciate assistance Podiatry  following, as noted above, planning to hold off on return to OR at this point in setting of her stroke/carotid stenosis NWB LLE  Acute Hypoxic Respiratory Failure Aspiration Pneumonia On RA CXR 11/3 with right lung opacity concerning for superimposed infection - also with findings concerning for pulmonary edema and small right effusion CXR 11/4 with improvement, vascular congestion, no persistent interstitial or airspace edema Already on unasyn Resp cx with rare yeast, will follow final cx -> rare candida albicans  Atrial Fibrillation with RVR Appreciate cardiology recommendations Heparin on hold with bleeding Continue  metoprolol and diltiazem PO/per tube  Thrombocytopenia 4T score intermediate risk, will send HIT Ab Hold heparin, currently holding anticoagulation with recent bleeding above   Dysphagia 2/2 stroke SLP eval Pulled NG overnight, will follow PO intake on dysphagia 1 nectar thick diet  AKI on CKD IV Baseline creatinine around 3.2 Improved today, continue to monitor Avoid nephrotoxins Appreciate renal assistance  Elevated Troponin Cardiology following, thought 2/2 demand  Hx NASH cirrhosis Noted  Hypothyroidism Synthroid  T2DM SSI, follow A1c 6.3  DVT prophylaxis: SCD Code Status:full  Family Communication: none at bedside - called friend in chart 11/4 Disposition:   Status is: Inpatient  Remains inpatient appropriate because: continued need for IV therapies, consultant care       Consultants:  PCCM Nephrology Neurology ID Podiatry cardiology  Procedures:  EEG Impression and clinical correlation: This EEG was obtained while sedated and asleep and is abnormal due to moderate diffuse slowing and occasional brief periods of intervening suppression both indicative of cerebral dysfunction, medication effect, or both. Echo IMPRESSIONS     1. Left ventricular ejection fraction, by estimation, is 55 to 60%. The  left ventricle has normal  function. The left ventricle has no regional  wall motion abnormalities. There is mild concentric left ventricular  hypertrophy. Left ventricular diastolic  parameters are indeterminate. Elevated left ventricular end-diastolic  pressure.   2. Right ventricular systolic function is normal. The right ventricular  size is normal. There is normal pulmonary artery systolic pressure.   3. Left atrial size was severely dilated.   4. The mitral valve is normal in structure. Trivial mitral valve  regurgitation. No evidence of mitral stenosis.   5. The aortic valve is tricuspid. Aortic valve regurgitation is not  visualized. No aortic stenosis is present.   6. The inferior vena cava is normal in size with greater than 50%  respiratory variability, suggesting right atrial pressure of 3 mmHg.   7. There is Tyarra Nolton small patent foramen ovale.  Procedures:             1) amputation ray second metatarsal and toe             2) incision and drainage deep abscess multiple fascial planes             3) bone biopsy open deep third metatarsal Antimicrobials:  Anti-infectives (From admission, onward)    Start     Dose/Rate Route Frequency Ordered Stop   11/14/20 2200  Ampicillin-Sulbactam (UNASYN) 3 g in sodium chloride 0.9 % 100 mL IVPB        3 g 200 mL/hr over 30 Minutes Intravenous Every 12 hours 11/14/20 1610     11/14/20 0900  piperacillin-tazobactam (ZOSYN) IVPB 2.25 g  Status:  Discontinued        2.25 g 100 mL/hr over 30 Minutes Intravenous Every 8 hours  11/14/20 0805 11/14/20 1609   11/13/20 2200  piperacillin-tazobactam (ZOSYN) IVPB 3.375 g  Status:  Discontinued        3.375 g 12.5 mL/hr over 240 Minutes Intravenous Every 12 hours 11/13/20 0758 11/14/20 0805   11/13/20 0600  piperacillin-tazobactam (ZOSYN) IVPB 2.25 g        2.25 g 100 mL/hr over 30 Minutes Intravenous Every 8 hours 11/12/20 2155 11/13/20 1725   11/11/20 1600  vancomycin (VANCOCIN) IVPB 1000 mg/200 mL premix  Status:   Discontinued        1,000 mg 200 mL/hr over 60 Minutes Intravenous Every 48 hours 11/09/20 1618 11/10/20 1045   11/11/20 1558  vancomycin (VANCOCIN) powder  Status:  Discontinued          As needed 11/11/20 1558 11/11/20 1558   11/10/20 2000  vancomycin (VANCOCIN) IVPB 1000 mg/200 mL premix  Status:  Discontinued        1,000 mg 200 mL/hr over 60 Minutes Intravenous Every 48 hours 11/10/20 1918 11/12/20 0940   11/09/20 1800  ceFEPIme (MAXIPIME) 2 g in sodium chloride 0.9 % 100 mL IVPB  Status:  Discontinued        2 g 200 mL/hr over 30 Minutes Intravenous Every 24 hours 11/09/20 1619 11/12/20 2146   11/09/20 1619  vancomycin variable dose per unstable renal function (pharmacist dosing)  Status:  Discontinued         Does not apply See admin instructions 11/09/20 1619 11/12/20 1521   11/09/20 1615  vancomycin (VANCOREADY) IVPB 1750 mg/350 mL        1,750 mg 175 mL/hr over 120 Minutes Intravenous  Once 11/09/20 1606 11/09/20 1947   11/09/20 1600  metroNIDAZOLE (FLAGYL) IVPB 500 mg  Status:  Discontinued        500 mg 100 mL/hr over 60 Minutes Intravenous Every 8 hours 11/09/20 1555 11/12/20 2146       Subjective: Says she wants to be Traven Davids part of decision making processes Difficult to understand, with expressive aphasia  Objective: Vitals:   11/18/20 2228 11/19/20 0412 11/19/20 0839 11/19/20 0849  BP: (!) 144/107 (!) 147/118 (!) 126/115   Pulse: (!) 124 (!) 147 (!) 109 (!) 138  Resp:  16 18 20   Temp: 98 F (36.7 C) 98.4 F (36.9 C) 98.1 F (36.7 C)   TempSrc: Oral     SpO2: 97%  98% 94%  Weight:  95.6 kg    Height:        Intake/Output Summary (Last 24 hours) at 11/19/2020 0901 Last data filed at 11/19/2020 0630 Gross per 24 hour  Intake 2509.06 ml  Output 750 ml  Net 1759.06 ml   Filed Weights   11/16/20 0330 11/17/20 0500 11/19/20 0412  Weight: 91.7 kg 91.8 kg 95.6 kg    Examination:  General: No acute distress. Cardiovascular: RRR Lungs: unlabored Abdomen:  Soft, nontender, nondistended  Neurological: expressive aphasia, right sided weakness Skin: Warm and dry. No rashes or lesions. Extremities: dressing intact to LLE    Data Reviewed: I have personally reviewed following labs and imaging studies  CBC: Recent Labs  Lab 11/15/20 0942 11/16/20 0246 11/17/20 0815 11/18/20 0227 11/18/20 1237 11/18/20 2038 11/19/20 0543  WBC 12.8* 10.8* 9.3 10.6*  --   --  12.5*  NEUTROABS  --  8.5* 6.9 8.2*  --   --  9.6*  HGB 8.3* 8.0* 8.2* 6.4* 7.3* 9.0* 8.5*  HCT 26.1* 25.2* 26.2* 20.5* 23.0* 27.4* 26.6*  MCV 87.9 86.3  88.8 89.1  --   --  91.4  PLT 197 168 134* 124*  --   --  104*    Basic Metabolic Panel: Recent Labs  Lab 11/14/20 0526 11/15/20 0942 11/16/20 0246 11/17/20 0815 11/18/20 0227 11/19/20 0543  NA 133* 134* 139 139 143 141  K 4.5 3.9 3.9 4.1 4.4 4.6  CL 104 102 101 102 106 107  CO2 16* 19* 25 28 26 25   GLUCOSE 183* 147* 166* 175* 194* 136*  BUN 85* 106* 99* 97* 95* 79*  CREATININE 4.91* 5.23* 4.79* 4.24* 3.89* 3.36*  CALCIUM 8.9 8.3* 8.2* 8.5* 8.5* 8.8*  MG  --  2.4 2.4 2.1 2.0 2.1  PHOS 7.8*  --  5.9* 4.9* 3.8 3.8    GFR: Estimated Creatinine Clearance: 18.7 mL/min (Ladye Macnaughton) (by C-G formula based on SCr of 3.36 mg/dL (H)).  Liver Function Tests: Recent Labs  Lab 11/14/20 0526 11/16/20 0246 11/17/20 0815 11/18/20 0227 11/19/20 0543  AST  --  19 19 19 19   ALT  --  18 18 15 16   ALKPHOS  --  59 66 54 57  BILITOT  --  0.9 0.7 0.8 0.8  PROT  --  5.6* 5.8* 5.0* 4.9*  ALBUMIN 2.2* 2.1* 2.3* 2.0* 2.1*    CBG: Recent Labs  Lab 11/18/20 1657 11/18/20 2050 11/19/20 0015 11/19/20 0510 11/19/20 0836  GLUCAP 177* 210* 176* 125* 137*     Recent Results (from the past 240 hour(s))  Resp Panel by RT-PCR (Flu Geraldine Sandberg&B, Covid) Nasopharyngeal Swab     Status: None   Collection Time: 11/09/20  6:25 PM   Specimen: Nasopharyngeal Swab; Nasopharyngeal(NP) swabs in vial transport medium  Result Value Ref Range Status   SARS  Coronavirus 2 by RT PCR NEGATIVE NEGATIVE Final    Comment: (NOTE) SARS-CoV-2 target nucleic acids are NOT DETECTED.  The SARS-CoV-2 RNA is generally detectable in upper respiratory specimens during the acute phase of infection. The lowest concentration of SARS-CoV-2 viral copies this assay can detect is 138 copies/mL. Inas Avena negative result does not preclude SARS-Cov-2 infection and should not be used as the sole basis for treatment or other patient management decisions. Cammi Consalvo negative result may occur with  improper specimen collection/handling, submission of specimen other than nasopharyngeal swab, presence of viral mutation(s) within the areas targeted by this assay, and inadequate number of viral copies(<138 copies/mL). Leanette Eutsler negative result must be combined with clinical observations, patient history, and epidemiological information. The expected result is Negative.  Fact Sheet for Patients:  EntrepreneurPulse.com.au  Fact Sheet for Healthcare Providers:  IncredibleEmployment.be  This test is no t yet approved or cleared by the [place] FDA and  has been authorized for detection and/or diagnosis of SARS-CoV-2 by FDA under an Emergency Use Authorization (EUA). This EUA will remain  in effect (meaning this test can be used) for the duration of the COVID-19 declaration under Section 564(b)(1) of the Act, 21 U.S.C.section 360bbb-3(b)(1), unless the authorization is terminated  or revoked sooner.       Influenza Kalesha Irving by PCR NEGATIVE NEGATIVE Final   Influenza B by PCR NEGATIVE NEGATIVE Final    Comment: (NOTE) The Xpert Xpress SARS-CoV-2/FLU/RSV plus assay is intended as an aid in the diagnosis of influenza from Nasopharyngeal swab specimens and should not be used as Lajuanna Pompa sole basis for treatment. Nasal washings and aspirates are unacceptable for Xpert Xpress SARS-CoV-2/FLU/RSV testing.  Fact Sheet for  Patients: EntrepreneurPulse.com.au  Fact Sheet for Healthcare Providers: IncredibleEmployment.be  This  test is not yet approved or cleared by the [place] and has been authorized for detection and/or diagnosis of SARS-CoV-2 by FDA under an Emergency Use Authorization (EUA). This EUA will remain in effect (meaning this test can be used) for the duration of the COVID-19 declaration under Section 564(b)(1) of the Act, 21 U.S.C. section 360bbb-3(b)(1), unless the authorization is terminated or revoked.  Performed at [facility], [address] [place], [place] [address]   MRSA Next Gen by PCR, Nasal     Status: None   Collection Time: 11/10/20  1:14 AM   Specimen: Nasal Mucosa; Nasal Swab  Result Value Ref Range Status   MRSA by PCR Next Gen NOT DETECTED NOT DETECTED Final    Comment: (NOTE) The GeneXpert MRSA Assay (FDA approved for NASAL specimens only), is one component of Jayelle Page comprehensive MRSA colonization surveillance program. It is not intended to diagnose MRSA infection nor to guide or monitor treatment for MRSA infections. Test performance is not FDA approved in patients less than 97 years old. Performed at [facility]: [address], [address] [place], [place] [address]   Aerobic/Anaerobic Culture w Gram Stain (surgical/deep wound)     Status: None   Collection Time: 11/11/20  3:23 PM   Specimen: Foot, Left; Abscess  Result Value Ref Range Status   Specimen Description   Final    WOUND LEFT FOOT ABSCESS Performed at [facility], [address], [place], [place] [address]    Special Requests   Final    NONE Performed at [facility], [address] [place], [place] [address]    Gram Stain   Final    NO ORGANISMS SEEN SQUAMOUS EPITHELIAL CELLS PRESENT MODERATE WBC PRESENT, PREDOMINANTLY MONONUCLEAR MODERATE GRAM POSITIVE COCCI Performed at [facility], [place] [address]  [place], [place] [address]    Culture   Final    RARE STAPHYLOCOCCUS AUREUS RARE PASTEURELLA MULTOCIDA Usually susceptible to penicillin and other beta lactam agents,quinolones,macrolides and tetracyclines. MIXED ANAEROBIC FLORA PRESENT.  CALL LAB IF FURTHER IID REQUIRED.    Report Status 11/17/2020 FINAL  Final   Organism ID, Bacteria STAPHYLOCOCCUS AUREUS  Final      Susceptibility   Staphylococcus aureus - MIC*    CIPROFLOXACIN <=0.5 SENSITIVE Sensitive     ERYTHROMYCIN <=0.25 SENSITIVE Sensitive     GENTAMICIN <=0.5 SENSITIVE Sensitive     OXACILLIN <=0.25 SENSITIVE Sensitive     TETRACYCLINE <=1 SENSITIVE Sensitive     VANCOMYCIN <=0.5 SENSITIVE Sensitive     TRIMETH/SULFA <=10 SENSITIVE Sensitive     CLINDAMYCIN <=0.25 SENSITIVE Sensitive     RIFAMPIN <=0.5 SENSITIVE Sensitive     Inducible Clindamycin NEGATIVE Sensitive     * RARE STAPHYLOCOCCUS AUREUS  Aerobic/Anaerobic Culture w Gram Stain (surgical/deep wound)     Status: None   Collection Time: 11/11/20  3:32 PM   Specimen: Other Source; Tissue  Result Value Ref Range Status   Specimen Description   Final    WOUND 2ND METATARSAL Performed at [facility], [address], [place], [place] [address]    Special Requests   Final    NONE Performed at [facility], [place], [place] [address]    Gram Stain   Final    NO ORGANISMS SEEN SQUAMOUS EPITHELIAL CELLS PRESENT FEW WBC PRESENT, PREDOMINANTLY MONONUCLEAR FEW GRAM POSITIVE COCCI Performed at [facility], [place] [address], [place], [place] [address]  Culture   Final    RARE STREPTOCOCCUS MITIS/ORALIS RARE PASTEURELLA MULTOCIDA Usually susceptible to penicillin and other beta lactam agents,quinolones,macrolides and tetracyclines. ABUNDANT BACTEROIDES SPECIES BETA LACTAMASE POSITIVE RARE STAPHYLOCOCCUS AUREUS    Report Status 11/17/2020 FINAL  Final   Organism ID, Bacteria STREPTOCOCCUS MITIS/ORALIS  Final   Organism ID,  Bacteria STAPHYLOCOCCUS AUREUS  Final      Susceptibility   Staphylococcus aureus - MIC*    CIPROFLOXACIN <=0.5 SENSITIVE Sensitive     ERYTHROMYCIN <=0.25 SENSITIVE Sensitive     GENTAMICIN <=0.5 SENSITIVE Sensitive     OXACILLIN <=0.25 SENSITIVE Sensitive     TETRACYCLINE <=1 SENSITIVE Sensitive     VANCOMYCIN <=0.5 SENSITIVE Sensitive     TRIMETH/SULFA <=10 SENSITIVE Sensitive     CLINDAMYCIN <=0.25 SENSITIVE Sensitive     RIFAMPIN <=0.5 SENSITIVE Sensitive     Inducible Clindamycin NEGATIVE Sensitive     * RARE STAPHYLOCOCCUS AUREUS   Streptococcus mitis/oralis - MIC*    TETRACYCLINE 0.5 SENSITIVE Sensitive     VANCOMYCIN 0.5 SENSITIVE Sensitive     CLINDAMYCIN <=0.25 SENSITIVE Sensitive     * RARE STREPTOCOCCUS MITIS/ORALIS  Aerobic/Anaerobic Culture w Gram Stain (surgical/deep wound)     Status: None   Collection Time: 11/11/20  4:26 PM   Specimen: Other Source; Tissue  Result Value Ref Range Status   Specimen Description   Final    WOUND LEFT METATARSAL Performed at [facility], [address] [place], [place] [address]    Special Requests   Final    NONE Performed at [facility], [place], [place] [address]    Gram Stain NO WBC SEEN NO ORGANISMS SEEN   Final   Culture   Final    RARE BACTEROIDES SPECIES BETA LACTAMASE POSITIVE RARE PEPTOSTREPTOCOCCUS MICROS Standardized susceptibility testing for this organism is not available. Performed at [facility], [place] [address], [place], [place] [address]    Report Status 11/17/2020 FINAL  Final  MRSA Next Gen by PCR, Nasal     Status: None   Collection Time: 11/13/20 11:22 AM   Specimen: Nasal Mucosa; Nasal Swab  Result Value Ref Range Status   MRSA by PCR Next Gen NOT DETECTED NOT DETECTED Final    Comment: (NOTE) The GeneXpert MRSA Assay (FDA approved for NASAL specimens only), is one component of Onesti Bonfiglio comprehensive MRSA colonization surveillance program. It is not intended  to diagnose MRSA infection nor to guide or monitor treatment for MRSA infections. Test performance is not FDA approved in patients less than 32 years old. Performed at [facility], [address] [place], [place] [address]   CULTURE, BLOOD (ROUTINE X 2) w Reflex to ID Panel     Status: None   Collection Time: 11/13/20 12:44 PM   Specimen: BLOOD  Result Value Ref Range Status   Specimen Description BLOOD LEFT ANTECUBITAL  Final   Special Requests   Final    BOTTLES DRAWN AEROBIC AND ANAEROBIC Blood Culture adequate volume   Culture   Final    NO GROWTH 5 DAYS Performed at [facility], [address] [place], [place] [address]    Report Status 11/18/2020 FINAL  Final  CULTURE, BLOOD (ROUTINE X 2) w Reflex to ID Panel     Status: None   Collection Time: 11/13/20 12:52 PM   Specimen: BLOOD  Result Value Ref Range Status   Specimen Description BLOOD BLOOD LEFT HAND  Final   Special  Requests   Final    BOTTLES DRAWN AEROBIC ONLY Blood Culture results may not be optimal due to an inadequate volume of blood received in culture bottles   Culture   Final    NO GROWTH 5 DAYS Performed at [facility], [address] [place], [place] [address]    Report Status 11/18/2020 FINAL  Final  Culture, Respiratory w Gram Stain     Status: None   Collection Time: 11/13/20  3:43 PM   Specimen: Tracheal Aspirate; Respiratory  Result Value Ref Range Status   Specimen Description   Final    TRACHEAL ASPIRATE Performed at [facility], [address], [place], [place] [address]    Special Requests   Final    NONE Performed at [facility], [place], [place] [address]    Gram Stain   Final    NO SQUAMOUS EPITHELIAL CELLS SEEN FEW WBC SEEN NO ORGANISMS SEEN Performed at [facility], [place] [address], [place], [place] [address]    Culture RARE CANDIDA ALBICANS  Final   Report Status 11/16/2020 FINAL  Final          Radiology Studies: US Carotid Bilateral  Result Date: 11/19/2020 CLINICAL DATA:  Stroke. EXAM: BILATERAL CAROTID DUPLEX ULTRASOUND TECHNIQUE: Pearline Cables scale imaging, color Doppler and duplex ultrasound were performed of bilateral carotid and vertebral arteries in the neck. COMPARISON:  None. FINDINGS: Examination is degraded secondary to patient's inability to tolerate appropriate positioning. Criteria: Quantification of carotid stenosis is based on velocity parameters that correlate the residual internal carotid diameter with NASCET-based stenosis levels, using the diameter of the distal internal carotid lumen as the denominator for stenosis measurement. The following velocity measurements were obtained: RIGHT ICA: 44/21 cm/sec CCA: 18/56 cm/sec SYSTOLIC ICA/CCA RATIO:  1.2 ECA: 44 cm/sec LEFT ICA: 75/21 cm/sec CCA: 31/49 cm/sec SYSTOLIC ICA/CCA RATIO:  1.8 ECA: 91 cm/sec RIGHT CAROTID ARTERY: There is Mahmood Boehringer moderate to large amount of eccentric echogenic plaque within the right carotid bulb (image 18). There is Jaelon Gatley large amount of irregular mixed echogenic plaque involving the origin and proximal aspects of the right internal carotid artery (image 25), which results in Millard Bautch sonographic string sign (image 26) and markedly blunted monophasic waveform within the proximal aspect of the right internal carotid artery (image 27). RIGHT VERTEBRAL ARTERY:  Antegrade Flow LEFT CAROTID ARTERY: There is Nycere Presley moderate amount of intimal thickening/atherosclerotic plaque scattered throughout the left common carotid artery. There is Makenize Messman moderate to large amount of eccentric partially shadowing plaque within the left carotid bulb (image 52). There is Dolph Tavano moderate amount of eccentric echogenic partially shadowing plaque involving the origin and proximal aspects of the left internal carotid artery (image 58), not definitely resulting in elevated peak systolic velocities in the left internal carotid artery, though note, velocity  measurements may be unreliable in the setting of Thania Woodlief suspected contralateral subtotal occlusion. LEFT VERTEBRAL ARTERY:  Antegrade flow IMPRESSION: 1. Large amount of right-sided atherosclerotic plaque results in Chasity Outten sonographic string sign and markedly blunted monophasic waveform involving the proximal aspect of the right internal carotid artery worrisome for Heavenlee Maiorana subtotal occlusion. Further evaluation with CTA could be performed as indicated. 2. Moderate to large amount of left-sided atherosclerotic plaque, not definitely resulting in Hanz Winterhalter hemodynamically significant stenosis within the left internal carotid artery, though note, velocity measurements may be unreliable in the setting of Natacia Chaisson suspected contralateral subtotal occlusion. Again, this could be further evaluated with CTA as indicated. 3. Antegrade  flow demonstrated within the bilateral vertebral arteries. These results will be called to the ordering clinician or representative by the Radiologist Assistant, and communication documented in the PACS or [employer]. Electronically Signed   By: Sandi Mariscal M.D.   On: 11/19/2020 08:36   DG Abd Portable 1V  Result Date: 11/17/2020 CLINICAL DATA:  Check gastric catheter placement EXAM: PORTABLE ABDOMEN - 1 VIEW COMPARISON:  11/15/2020 FINDINGS: Weighted feeding catheter is again noted in the distal stomach directed towards the pool or so. No free air is seen. IMPRESSION: Weighted feeding catheter in the distal stomach. Electronically Signed   By: Inez Catalina M.D.   On: 11/17/2020 19:21        Scheduled Meds:  sodium chloride   Intravenous Once   allopurinol  200 mg Per Tube Daily   vitamin C  500 mg Per Tube BID   budesonide (PULMICORT) nebulizer solution  0.5 mg Nebulization BID   Chlorhexidine Gluconate Cloth  6 each Topical Daily   diltiazem  60 mg Per Tube Q6H   docusate  100 mg Per Tube BID   ferrous gluconate  324 mg Oral Q breakfast   free water  30 mL Per Tube Q4H   insulin aspart  0-6 Units  Subcutaneous Q4H   ipratropium-albuterol  3 mL Nebulization TID   levothyroxine  100 mcg Per Tube Q0600   mouth rinse  15 mL Mouth Rinse BID   metoprolol tartrate  50 mg Per Tube BID   nutrition supplement (JUVEN)  1 packet Per Tube BID BM   pantoprazole (PROTONIX) IV  40 mg Intravenous Daily   polyethylene glycol  17 g Per Tube Daily   venlafaxine  100 mg Per Tube BID   Continuous Infusions:  sodium chloride 5 mL/hr at 11/18/20 0700   ampicillin-sulbactam (UNASYN) IV 3 g (11/18/20 2356)   feeding supplement (VITAL 1.5 CAL) 1,000 mL (11/18/20 0531)   lactated ringers 75 mL/hr at 11/19/20 0049     LOS: 10 days    Time spent: over 30 min    Fayrene Helper, MD [facility]   To contact the attending provider between 7A-7P or the covering provider during after hours 7P-7A, please log into the web site [email or web address] and access using universal [place] password for that web site. If you do not have the password, please call the hospital operator.  11/19/2020, 9:01 AM

## 2020-11-19 NOTE — Progress Notes (Signed)
Speech Language Pathology Treatment: Dysphagia;Cognitive-Linquistic  Patient Details Name: Autumn [name] MRN: [id number] DOB: Oct 26, 1950 Today's Date: 11/19/2020 Time: 5625-6389 SLP Time Calculation (min) (ACUTE ONLY): 65 min  Assessment / Plan / Recommendation Clinical Impression  Pt seen for ongoing assessment of swallowing w/ trials to upgrade diet hopefully. Pt was also seen for speech tx targeting Motor Speech and Dysarthria. She was alert, verbally responsive and engaged in conversation w/ SLP; Dysarthria++ impacting intelligibility of speech. Pt Turner O2 2L; missing most/all Dentition and does not wear Dentures per pt report.  Pt explained general aspiration precautions and agreed verbally to the need for following them especially sitting upright for all oral intake and using Small, single bites/sips. Pt was supported behind the back for a more full, upright sitting position. Pt assisted w/ positioning d/t RUE weakness. She consumed trials of ice chips then fed self by holding Cup trials of thin liquids w/ No overt, clinical s/s of aspiration w/ the Small sips -- audible swallows(x3) and bolus loss(x1) noted w/ Larger sips. No oropharyngeal phase deficits were noted w/ Minced consistency trials. Overall, respiratory status remained calm and unlabored, vocal quality clear b/t trials, and no coughing during oral intake trials of upgraded consistencies. Pt did require verbal/visual cues intermittently as cues/reminders for following aspiration precautions and to use Small, single sips when drinking thin liquids. Oral phase appeared [facility] for bolus management and timely A-P transfer for swallowing w/ the Minced food trials despite the R lingual/oral weakness and reduced coordination; oral clearing achieved w/ all consistencies. NSG denied any deficits in swallowing w/ current dysphagia/puree diet w/ nectar liquids. Pt does not wear Dentures and was eating a regular diet at home she reported prior to  this hospitalization -- used to gumming/mashing her foods.    Pt appears at reduced risk for aspiration when following aspiration precautions w/ the upgraded consistency diet. Recommend a MINCED foods diet for ease w/ gravies added to moisten foods; Thin liquids VIA CUP ONLY. Recommend aspiration precautions; Pills Whole in Puree; tray setup and positioning assistance for meals w/ assistance feeding as needed -- PT TO HOLD CUP HERSELF. ST services will continue to follow for toleration of diet, education while admitted. NSG updated. Precautions posted at bedside.   Skilled ST services included ongoing assessment of Motor Speech deficits, Dysarthria and targeted/functional ST tasks, use of verbal/visual/tactile cues, and developed visual aid for cues to enhance speech intelligibility during exercises and general speech w/ others. Pt with overall improvement in her verbal communication as noted by notes and by staff -- this is further enhanced when she implements Dysarthria strategies. Tasks completed included: increased automatic speech/tasks, reading of single menu items, short phrase response/greetings, and ID of concrete items in room stating name/function -- all tasks w/ focus on utilizing Dysarthria strategies of increased volume, over-articulation, and slowing rate. Pt was able to increase intelligibility/accuracy in her speech responses and simple conversation w/ other staff member to be understood ~50% of the time. Pt seemed pleased w/ her progress stating she "wanted to move forward and not become stagnant"; use "baby steps"; and "go to Rehab".    SLP posted visual aid re: education to enhance effective communication and for Dysarthria exs for pt; discussed and reviewed w/ pt b/f posting in room. SLP recommends encouraging use of strategies, providing additional time for responses, and ask for repetition when needed for understanding but offer encouragement. Family not present to provide education,  however visual aid/handout in room for review. Continue skilled ST  services to address Motor Speech and Dysarthria deficits. Pt left with call bell in reach and all needs met prior to exiting room. Pt seemed drowsy post session.     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  She'd been following with a [facility] podiatrist outpatient and had recently refused surgery.  She'd recently been treated with antibiotics at [facility] for the diabetic foot infection with enterobacter aerogans culture positive.  She was admitted on 10/29 due to falls at home with worsening L foot chronic diabetic ulcer with osteomyelitis of the 2nd and 3rd metatarsals and cellulitis noted on MRI.  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  Her post operative course was complicated by unresponsiveness and neurologic changes on 11/13/2021. She was found to have a stroke (MRI Brain showed acute to early subacute infarction in the left posterior frontal cortical and subcortical brain, possibly affecting the precentral gyrus) and was intubated for airway protection.  She was extubated 11/3 and transferred to [facility] service on 11/4.      SLP Plan  Continue with current plan of care      Recommendations for follow up therapy are one component of a multi-disciplinary discharge planning process, led by the attending physician.  Recommendations may be updated based on patient status, additional functional criteria and insurance authorization.    Recommendations  Diet recommendations: Dysphagia 2 (fine chop);Thin liquid Liquids provided via: Cup;No straw Medication Administration: Whole meds with puree (vs need to Crush) Supervision: Patient able to self feed;Staff to assist with self feeding;Intermittent supervision to cue for compensatory strategies Compensations: Minimize  environmental distractions;Slow rate;Small sips/bites;Lingual sweep for clearance of pocketing;Follow solids with liquid Postural Changes and/or Swallow Maneuvers: Out of bed for meals;Seated upright 90 degrees;Upright 30-60 min after meal                General recommendations:  (PT/OT ongoing) Oral Care Recommendations: Oral care BID;Oral care before and after PO;Staff/trained caregiver to provide oral care Follow Up Recommendations: Skilled nursing-short term rehab (<3 hours/day) Assistance recommended at discharge: Intermittent Supervision/Assistance SLP Visit Diagnosis: Cognitive communication deficit (R41.841);Dysphagia, oropharyngeal phase (R13.12) (Motor Speech) Plan: Continue with current plan of care       GO                  Orinda Kenner, [place], CCC-SLP Speech Language Pathologist Rehab Services [phone] [facility]  11/19/2020, 12:06 PM

## 2020-11-19 NOTE — Progress Notes (Signed)
PT Cancellation Note  Patient Details Name: Autumn [name] MRN: [id number] DOB: 05-13-1950   Cancelled Treatment:    Reason Eval/Treat Not Completed: Medical issues which prohibited therapy Heart rate elevated 130-145 at rest, RN instructing to wait to lower resting HR. PT to reassess as able.   [employer], SPT

## 2020-11-19 NOTE — Progress Notes (Signed)
Date of Admission:  11/09/2020   T   ID: Autumn [name] is a 70 y.o. female  Principal Problem:   Sepsis ([place]) Active Problems:   Atrial fibrillation with RVR ([place])   Osteomyelitis of foot, left, acute ([place])   CKD stage 4 due to type 2 diabetes mellitus ([place])   Liver cirrhosis secondary to NASH (nonalcoholic steatohepatitis) ([place])   Hypertension   History of anemia due to CKD   Frequent falls   AKI (acute kidney injury) ([place])   Cellulitis and abscess of foot, except toes   Infectious tenosynovitis   Wheeze   Atrial fibrillation with rapid ventricular response ([facility])    Subjective: Pt awake  More alert Speech better today still some aphasia Eating soft food   Medications:   sodium chloride   Intravenous Once   allopurinol  200 mg Per Tube Daily   vitamin C  500 mg Per Tube BID   budesonide (PULMICORT) nebulizer solution  0.5 mg Nebulization BID   Chlorhexidine Gluconate Cloth  6 each Topical Daily   diltiazem  60 mg Per Tube Q6H   docusate  100 mg Per Tube BID   ferrous gluconate  324 mg Oral Q breakfast   insulin aspart  0-6 Units Subcutaneous Q4H   ipratropium-albuterol  3 mL Nebulization TID   levothyroxine  100 mcg Per Tube Q0600   mouth rinse  15 mL Mouth Rinse BID   metoprolol tartrate  50 mg Per Tube Q6H   nutrition supplement (JUVEN)  1 packet Per Tube BID BM   pantoprazole (PROTONIX) IV  40 mg Intravenous Daily   polyethylene glycol  17 g Per Tube Daily   sodium chloride flush  3 mL Intravenous Q12H   venlafaxine  100 mg Per Tube BID    Objective: Vital signs in last 24 hours: Temp:  [97.9 F (36.6 C)-98.4 F (36.9 C)] 97.9 F (36.6 C) (11/08 1435) Pulse Rate:  [109-147] 124 (11/08 1435) Resp:  [16-20] 18 (11/08 1435) BP: (126-151)/(91-118) 151/107 (11/08 1435) SpO2:  [90 %-99 %] 96 % (11/08 1435) Weight:  [95.6 kg] 95.6 kg (11/08 0412)  PHYSICAL EXAM:  General: awake, alert Aphasia Rt hemiparesis  Lungs: b/l air entry- Heart:  irregular. Abdomen: Soft, non-tender,not distended. Bowel sounds normal. No masses Extremities: left foot dressing not removed     Skin: No rashes or lesions. Or bruising Lymph: Cervical, supraclavicular normal. Neurologic: rt hemiparesis  Lab Results Recent Labs    11/18/20 0227 11/18/20 1237 11/18/20 2038 11/19/20 0543  WBC 10.6*  --   --  12.5*  HGB 6.4*   < > 9.0* 8.5*  HCT 20.5*   < > 27.4* 26.6*  NA 143  --   --  141  K 4.4  --   --  4.6  CL 106  --   --  107  CO2 26  --   --  25  BUN 95*  --   --  79*  CREATININE 3.89*  --   --  3.36*   < > = values in this interval not displayed.   Liver Panel Recent Labs    11/18/20 0227 11/19/20 0543  PROT 5.0* 4.9*  ALBUMIN 2.0* 2.1*  AST 19 19  ALT 15 16  ALKPHOS 54 57  BILITOT 0.8 0.8   Sedimentation Rate No results for input(s): ESRSEDRATE in the last 72 hours. C-Reactive Protein No results for input(s): CRP in the last 72 hours.  Microbiology: MRSA nares  not detected 11/13/20 [facility] Foot abscess culture Pasteurella, rare staph and strep mitis, peptostreptococcus, bacteroides Studies/Results: MR ANGIO HEAD WO CONTRAST  Result Date: 11/19/2020 CLINICAL DATA:  Stroke, follow up. EXAM: MRA NECK WITHOUT CONTRAST MRA HEAD WITHOUT CONTRAST TECHNIQUE: Angiographic images of the Circle of Willis were acquired using MRA technique without intravenous contrast. COMPARISON:  None. FINDINGS: MRA NECK FINDINGS The study is degraded by motion, particularly at the skull base. Luminal irregularity along the petrous segment of the bilateral internal carotid arteries with apparent high-grade stenosis, likely artifactual. Mild luminal irregularity of the cavernous segment of the bilateral internal carotid arteries. Normal caliber and flow related enhancement of the bilateral ACA vascular trees. Attenuation of flow in the M3/MCA branches bilaterally is likely artifactual. Normal caliber and flow related enhancement of the visualized  portions of the V4 segment of the bilateral vertebral arteries. The basilar artery and bilateral posterior cerebral arteries are maintained. MRA HEAD FINDINGS The study is severely degraded by motion, particularly at the level of the carotid bifurcations. Normal flow related enhancement seen in the visualized portions of the bilateral common carotid artery. Attenuation of flow at the level of the carotid bulbs and in the distal cervical segment may be artifactual. Note is made of retropharyngeal course of the bilateral common carotid arteries and proximal cervical internal carotid arteries. Normal caliber and flow related enhancement of the visualized V2 segment of the bilateral vertebral arteries. Attenuation of flow at the V3 segment is artifactual. IMPRESSION: 1. Motion degraded study significantly limiting evaluation. Consider repeat study or CT angiogram of the head and neck when clinically appropriate. 2. Luminal irregularity with high-grade stenosis in the petrous segment of the bilateral internal carotid arteries are likely artifactual. Likewise, attenuation of flow in the bilateral M3/MCA segments are likely artifactual. However, superimposed atherosclerotic stenosis cannot be excluded. 3. 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 4. Normal caliber of the visualized portions of the common carotid arteries and proximal carotid bifurcation with attenuation of flow in the bulbs and in the distal cervical segments, likely artifactual. Electronically Signed   By: Pedro Earls M.D.   On: 11/19/2020 15:41   MR ANGIO NECK WO CONTRAST  Result Date: 11/19/2020 CLINICAL DATA:  Stroke, follow up. EXAM: MRA NECK WITHOUT CONTRAST MRA HEAD WITHOUT CONTRAST TECHNIQUE: Angiographic images of the Circle of Willis were acquired using MRA technique without intravenous contrast. COMPARISON:  None. FINDINGS: MRA NECK FINDINGS The study is degraded by motion, particularly at the skull  base. Luminal irregularity along the petrous segment of the bilateral internal carotid arteries with apparent high-grade stenosis, likely artifactual. Mild luminal irregularity of the cavernous segment of the bilateral internal carotid arteries. Normal caliber and flow related enhancement of the bilateral ACA vascular trees. Attenuation of flow in the M3/MCA branches bilaterally is likely artifactual. Normal caliber and flow related enhancement of the visualized portions of the V4 segment of the bilateral vertebral arteries. The basilar artery and bilateral posterior cerebral arteries are maintained. MRA HEAD FINDINGS The study is severely degraded by motion, particularly at the level of the carotid bifurcations. Normal flow related enhancement seen in the visualized portions of the bilateral common carotid artery. Attenuation of flow at the level of the carotid bulbs and in the distal cervical segment may be artifactual. Note is made of retropharyngeal course of the bilateral common carotid arteries and proximal cervical internal carotid arteries. Normal caliber and flow related enhancement of the visualized V2 segment of the bilateral vertebral arteries. Attenuation  of flow at the V3 segment is artifactual. IMPRESSION: 1. Motion degraded study significantly limiting evaluation. Consider repeat study or CT angiogram of the head and neck when clinically appropriate. 2. Luminal irregularity with high-grade stenosis in the petrous segment of the bilateral internal carotid arteries are likely artifactual. Likewise, attenuation of flow in the bilateral M3/MCA segments are likely artifactual. However, superimposed atherosclerotic stenosis cannot be excluded. 3. 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 4. Normal caliber of the visualized portions of the common carotid arteries and proximal carotid bifurcation with attenuation of flow in the bulbs and in the distal cervical segments, likely  artifactual. Electronically Signed   By: Pedro Earls M.D.   On: 11/19/2020 15:41   US Carotid Bilateral  Result Date: 11/19/2020 CLINICAL DATA:  Stroke. EXAM: BILATERAL CAROTID DUPLEX ULTRASOUND TECHNIQUE: Pearline Cables scale imaging, color Doppler and duplex ultrasound were performed of bilateral carotid and vertebral arteries in the neck. COMPARISON:  None. FINDINGS: Examination is degraded secondary to patient's inability to tolerate appropriate positioning. Criteria: Quantification of carotid stenosis is based on velocity parameters that correlate the residual internal carotid diameter with NASCET-based stenosis levels, using the diameter of the distal internal carotid lumen as the denominator for stenosis measurement. The following velocity measurements were obtained: RIGHT ICA: 44/21 cm/sec CCA: 91/63 cm/sec SYSTOLIC ICA/CCA RATIO:  1.2 ECA: 44 cm/sec LEFT ICA: 75/21 cm/sec CCA: 84/66 cm/sec SYSTOLIC ICA/CCA RATIO:  1.8 ECA: 91 cm/sec RIGHT CAROTID ARTERY: There is a moderate to large amount of eccentric echogenic plaque within the right carotid bulb (image 18). There is a large amount of irregular mixed echogenic plaque involving the origin and proximal aspects of the right internal carotid artery (image 25), which results in a sonographic string sign (image 26) and markedly blunted monophasic waveform within the proximal aspect of the right internal carotid artery (image 27). RIGHT VERTEBRAL ARTERY:  Antegrade Flow LEFT CAROTID ARTERY: There is a moderate amount of intimal thickening/atherosclerotic plaque scattered throughout the left common carotid artery. There is a moderate to large amount of eccentric partially shadowing plaque within the left carotid bulb (image 52). There is a moderate amount of eccentric echogenic partially shadowing plaque involving the origin and proximal aspects of the left internal carotid artery (image 58), not definitely resulting in elevated peak systolic  velocities in the left internal carotid artery, though note, velocity measurements may be unreliable in the setting of a suspected contralateral subtotal occlusion. LEFT VERTEBRAL ARTERY:  Antegrade flow IMPRESSION: 1. Large amount of right-sided atherosclerotic plaque results in a sonographic string sign and markedly blunted monophasic waveform involving the proximal aspect of the right internal carotid artery worrisome for a subtotal occlusion. Further evaluation with CTA could be performed as indicated. 2. Moderate to large amount of left-sided atherosclerotic plaque, not definitely resulting in a hemodynamically significant stenosis within the left internal carotid artery, though note, velocity measurements may be unreliable in the setting of a suspected contralateral subtotal occlusion. Again, this could be further evaluated with CTA as indicated. 3. Antegrade flow demonstrated within the bilateral vertebral arteries. These results will be called to the ordering clinician or representative by the Radiologist Assistant, and communication documented in the PACS or [employer]. Electronically Signed   By: Sandi Mariscal M.D.   On: 11/19/2020 08:36     Assessment/Plan:  Resp distress - was intubated, now extubated and is out of ICU  Altered mental status due to CVA- much better MRI shows Acute to early subacute infarction  in the left posterior frontal cortical and subcortical brain, possibly affecting the precentral gyrus. Has rt hemiparesis and expressive aphasia  Diabetes mellitus with Left foot ulcer with infection and sepsis  underwent I/D and 2nd ray excision polymicrobial Culture MSSA, pasteurella, strep mitis The surgical wound looks heathy  On unasyn As pathology shows osteo at the bone margin will give IV antibiotics for 4 weeks-followed by PO Left foot surgical site bleeding secondary to heparin IV which has been discontinued     Afib-RVR on diltiazem and metoprolol-followed by  cardiology Severe anemia- got blood transfusion  AKI on CKD- worsening Avoid nephrotoxic drugs   DM    NASH leading to cirrhosis Rt heart failure   Hypothyroidism on synthroid-     Discussed the management with patient and care team   [facility] will cover tomorrow

## 2020-11-19 NOTE — Progress Notes (Signed)
Palliative Care Progress Note, Assessment & Plan   Patient Name: Autumn [name]       Date: 11/19/2020 DOB: 11-Dec-1950  Age: 70 y.o. MRN#: [id number] Attending Physician: Autumn [name]., * Primary Care Physician: Autumn Ohms, MD Admit Date: 11/09/2020  Reason for Consultation/Follow-up: Establishing goals of care  Subjective: Patient is sitting up in bed and speaking with the SLP therapist Autumn [name].  She looks comfortable and in no apparent distress.  She has no acute complaints at this time.  HPI: 70 y.o. female  with past medical history of sepsis without shock, AKI I on CKD, rapid A. 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   Palliative medicine team was consulted to establish goals of care.  Code Status: Full code  Prognosis:  Unable to determine  Discharge Planning: To Be Determined  Recommendations/Plan: After reviewing the medical records including epic notes, labs and imaging, assessed the patient at bedside.  She was currently working with SLP therapy on speech.  Speech shared that she is making great progress with her dysarthria.  She is learning to slow down her words, not garble her words, and make her words more enunciated and clear if she hears that she is starting to sound jumbled.  I advised the patient that I had been following her throughout her hospitalization.  She did not recall meeting me or knowing who I was so I reintroduced myself.  I reintroduced the palliative medicine is a  specialized medical care for people living with serious illness.  I focus on providing relief from symptoms and the stress of a serious illness.  The goal is to improve quality life for both the patient and the family.  I shared that I have been in contact with Autumn [name] and that I would continue to give her updates.  The patient agreed and said that that was wonderful.  During our discussion, the patient was asked if she would consider rehab.  She shared that she thinks she needs that and would like to go to get better.   I described to her that advanced care planning and goals of care discussions are important moving forward.  I shared that she was intubated with a breathing tube and a ventilator in the past.  She says she does not really remember this.  I shared that she remains a full code and should she have respiratory or cardiac challenges requiring CPR then she would likely be put back on a ventilator.  She said she did not want to think about that right now.  She shared she needs baby steps.  She spoke tangentially about a baby step baby book but was easily redirected to our discussion.  I shared I would continue to monitor her and round on her throughout her hospitalization.  I assured her I would provide support as she continues to make progress.  Questions and concerns were answered.  Patient encouraged to call with any future concerns.  Care plan was discussed with SLP Autumn [name], patient  I attempted to share updates with patient's friend Autumn [name] but there was no asnwer. HIPPA appropriate VM was left.   Physical Exam Constitutional:      Appearance: She is not ill-appearing.  HENT:     Head: Normocephalic.     Mouth/Throat:     Mouth: Mucous membranes are moist.  Cardiovascular:     Rate and Rhythm: Normal rate.     Pulses: Normal pulses.  Skin:    General: Skin is warm and dry.     Comments: Wound dressing to left foot - scant serous drainage present  Neurological:     Mental  Status: She is alert.     Comments: dysarthria  Psychiatric:        Mood and Affect: Mood normal.        Behavior: Behavior normal.        Thought Content: Thought content normal.        Judgment: Judgment normal.               Total Time 25 minutes Prolonged Time Billed  no   Greater than 50%  of this time was spent counseling and coordinating care related to the above assessment and plan.  Thank you for allowing the Palliative Medicine Team to assist in the care of this patient.  [place] Autumn Iha, FNP-BC Palliative Medicine Team Team Phone # [phone]

## 2020-11-19 NOTE — Progress Notes (Signed)
[facility]  Communication Note:  Consult received for carotid stenosis. Imagining results pending. Full consult to follow as soon as all imaging is available.   Discussed with Tamera Stands PA-C 11/19/2020 10:12 AM

## 2020-11-19 NOTE — Progress Notes (Signed)
[facility]  ROUNDING NOTE   Subjective:   Patient seen resting in bed, watching TV Alert and able to answer simple questions Tolerating small meals Denies pain or discomfort and shortness of breath. Creatinine slowly improving, with 750 mL recorded urine output on day shift yesterday  Objective:  Vital signs in last 24 hours:  Temp:  [97.5 F (36.4 C)-98.4 F (36.9 C)] 98.1 F (36.7 C) (11/08 0839) Pulse Rate:  [49-147] 138 (11/08 0849) Resp:  [16-26] 20 (11/08 0849) BP: (108-154)/(83-118) 126/115 (11/08 0839) SpO2:  [90 %-100 %] 94 % (11/08 0849) Weight:  [95.6 kg] 95.6 kg (11/08 0412)  Weight change:  Filed Weights   11/16/20 0330 11/17/20 0500 11/19/20 0412  Weight: 91.7 kg 91.8 kg 95.6 kg    Intake/Output: I/O last 3 completed shifts: In: 4172.1 [P.O.:480; I.V.:1837.1; Blood:425; NG/GT:1230; IV Piggyback:200] Out: 3532 [Urine:1825]   Intake/Output this shift:  No intake/output data recorded.  Physical Exam: General: NAD  Head: Atraumatic. Moist oral mucosal membranes  Eyes: Anicteric  Lungs:  Basilar crackles, normal effort  Heart: Irregular rhythm  Abdomen:  Soft, nontender  Extremities: No peripheral edema.  Surgical dressing on left foot  Neurologic: Alert, able to answer simple questions  Skin: Warm        Basic Metabolic Panel: Recent Labs  Lab 11/14/20 0526 11/15/20 0942 11/16/20 0246 11/17/20 0815 11/18/20 0227 11/19/20 0543  NA 133* 134* 139 139 143 141  K 4.5 3.9 3.9 4.1 4.4 4.6  CL 104 102 101 102 106 107  CO2 16* 19* 25 28 26 25   GLUCOSE 183* 147* 166* 175* 194* 136*  BUN 85* 106* 99* 97* 95* 79*  CREATININE 4.91* 5.23* 4.79* 4.24* 3.89* 3.36*  CALCIUM 8.9 8.3* 8.2* 8.5* 8.5* 8.8*  MG  --  2.4 2.4 2.1 2.0 2.1  PHOS 7.8*  --  5.9* 4.9* 3.8 3.8     Liver Function Tests: Recent Labs  Lab 11/14/20 0526 11/16/20 0246 11/17/20 0815 11/18/20 0227 11/19/20 0543  AST  --  19 19 19 19   ALT  --  18 18 15 16   ALKPHOS   --  59 66 54 57  BILITOT  --  0.9 0.7 0.8 0.8  PROT  --  5.6* 5.8* 5.0* 4.9*  ALBUMIN 2.2* 2.1* 2.3* 2.0* 2.1*    No results for input(s): LIPASE, AMYLASE in the last 168 hours. No results for input(s): AMMONIA in the last 168 hours.  CBC: Recent Labs  Lab 11/15/20 0942 11/16/20 0246 11/17/20 0815 11/18/20 0227 11/18/20 1237 11/18/20 2038 11/19/20 0543  WBC 12.8* 10.8* 9.3 10.6*  --   --  12.5*  NEUTROABS  --  8.5* 6.9 8.2*  --   --  9.6*  HGB 8.3* 8.0* 8.2* 6.4* 7.3* 9.0* 8.5*  HCT 26.1* 25.2* 26.2* 20.5* 23.0* 27.4* 26.6*  MCV 87.9 86.3 88.8 89.1  --   --  91.4  PLT 197 168 134* 124*  --   --  104*     Cardiac Enzymes: No results for input(s): CKTOTAL, CKMB, CKMBINDEX, TROPONINI in the last 168 hours.   BNP: Invalid input(s): POCBNP  CBG: Recent Labs  Lab 11/18/20 1657 11/18/20 2050 11/19/20 0015 11/19/20 0510 11/19/20 0836  GLUCAP 177* 210* 176* 125* 137*     Microbiology: Results for orders placed or performed during the hospital encounter of 11/09/20  Resp Panel by RT-PCR (Flu A&B, Covid) Nasopharyngeal Swab     Status: None   Collection Time: 11/09/20  6:25 PM   Specimen: Nasopharyngeal Swab; Nasopharyngeal(NP) swabs in vial transport medium  Result Value Ref Range Status   SARS Coronavirus 2 by RT PCR NEGATIVE NEGATIVE Final    Comment: (NOTE) SARS-CoV-2 target nucleic acids are NOT DETECTED.  The SARS-CoV-2 RNA is generally detectable in upper respiratory specimens during the acute phase of infection. The lowest concentration of SARS-CoV-2 viral copies this assay can detect is 138 copies/mL. A negative result does not preclude SARS-Cov-2 infection and should not be used as the sole basis for treatment or other patient management decisions. A negative result may occur with  improper specimen collection/handling, submission of specimen other than nasopharyngeal swab, presence of viral mutation(s) within the areas targeted by this assay, and  inadequate number of viral copies(<138 copies/mL). A negative result must be combined with clinical observations, patient history, and epidemiological information. The expected result is Negative.  Fact Sheet for Patients:  EntrepreneurPulse.com.au  Fact Sheet for Healthcare Providers:  IncredibleEmployment.be  This test is no t yet approved or cleared by the [place] FDA and  has been authorized for detection and/or diagnosis of SARS-CoV-2 by FDA under an Emergency Use Authorization (EUA). This EUA will remain  in effect (meaning this test can be used) for the duration of the COVID-19 declaration under Section 564(b)(1) of the Act, 21 U.S.C.section 360bbb-3(b)(1), unless the authorization is terminated  or revoked sooner.       Influenza A by PCR NEGATIVE NEGATIVE Final   Influenza B by PCR NEGATIVE NEGATIVE Final    Comment: (NOTE) The Xpert Xpress SARS-CoV-2/FLU/RSV plus assay is intended as an aid in the diagnosis of influenza from Nasopharyngeal swab specimens and should not be used as a sole basis for treatment. Nasal washings and aspirates are unacceptable for Xpert Xpress SARS-CoV-2/FLU/RSV testing.  Fact Sheet for Patients: EntrepreneurPulse.com.au  Fact Sheet for Healthcare Providers: IncredibleEmployment.be  This test is not yet approved or cleared by the [place] FDA and has been authorized for detection and/or diagnosis of SARS-CoV-2 by FDA under an Emergency Use Authorization (EUA). This EUA will remain in effect (meaning this test can be used) for the duration of the COVID-19 declaration under Section 564(b)(1) of the Act, 21 U.S.C. section 360bbb-3(b)(1), unless the authorization is terminated or revoked.  Performed at [facility], [address] [place], [place] [address]   MRSA Next Gen by PCR, Nasal     Status: None   Collection Time: 11/10/20  1:14 AM    Specimen: Nasal Mucosa; Nasal Swab  Result Value Ref Range Status   MRSA by PCR Next Gen NOT DETECTED NOT DETECTED Final    Comment: (NOTE) The GeneXpert MRSA Assay (FDA approved for NASAL specimens only), is one component of a comprehensive MRSA colonization surveillance program. It is not intended to diagnose MRSA infection nor to guide or monitor treatment for MRSA infections. Test performance is not FDA approved in patients less than 41 years old. Performed at [facility], [address] [place], [place] [address]   Aerobic/Anaerobic Culture w Gram Stain (surgical/deep wound)     Status: None   Collection Time: 11/11/20  3:23 PM   Specimen: Foot, Left; Abscess  Result Value Ref Range Status   Specimen Description   Final    WOUND LEFT FOOT ABSCESS Performed at [facility], [address], [place], [place] [address]    Special Requests   Final    NONE Performed at [facility], [place],  [place] [address]    Gram Stain   Final    NO ORGANISMS SEEN SQUAMOUS EPITHELIAL CELLS PRESENT MODERATE WBC PRESENT, PREDOMINANTLY MONONUCLEAR MODERATE GRAM POSITIVE COCCI Performed at [facility], [place] [address], [place], [place] [address]    Culture   Final    RARE STAPHYLOCOCCUS AUREUS RARE PASTEURELLA MULTOCIDA Usually susceptible to penicillin and other beta lactam agents,quinolones,macrolides and tetracyclines. MIXED ANAEROBIC FLORA PRESENT.  CALL LAB IF FURTHER IID REQUIRED.    Report Status 11/17/2020 FINAL  Final   Organism ID, Bacteria STAPHYLOCOCCUS AUREUS  Final      Susceptibility   Staphylococcus aureus - MIC*    CIPROFLOXACIN <=0.5 SENSITIVE Sensitive     ERYTHROMYCIN <=0.25 SENSITIVE Sensitive     GENTAMICIN <=0.5 SENSITIVE Sensitive     OXACILLIN <=0.25 SENSITIVE Sensitive     TETRACYCLINE <=1 SENSITIVE Sensitive     VANCOMYCIN <=0.5 SENSITIVE Sensitive     TRIMETH/SULFA <=10 SENSITIVE Sensitive     CLINDAMYCIN  <=0.25 SENSITIVE Sensitive     RIFAMPIN <=0.5 SENSITIVE Sensitive     Inducible Clindamycin NEGATIVE Sensitive     * RARE STAPHYLOCOCCUS AUREUS  Aerobic/Anaerobic Culture w Gram Stain (surgical/deep wound)     Status: None   Collection Time: 11/11/20  3:32 PM   Specimen: Other Source; Tissue  Result Value Ref Range Status   Specimen Description   Final    WOUND 2ND METATARSAL Performed at [facility], [address], [place], [place] [address]    Special Requests   Final    NONE Performed at [facility], [place], [place] [address]    Gram Stain   Final    NO ORGANISMS SEEN SQUAMOUS EPITHELIAL CELLS PRESENT FEW WBC PRESENT, PREDOMINANTLY MONONUCLEAR FEW GRAM POSITIVE COCCI Performed at [facility], [place] [address], [place], [place] [address]    Culture   Final    RARE STREPTOCOCCUS MITIS/ORALIS RARE PASTEURELLA MULTOCIDA Usually susceptible to penicillin and other beta lactam agents,quinolones,macrolides and tetracyclines. ABUNDANT BACTEROIDES SPECIES BETA LACTAMASE POSITIVE RARE STAPHYLOCOCCUS AUREUS    Report Status 11/17/2020 FINAL  Final   Organism ID, Bacteria STREPTOCOCCUS MITIS/ORALIS  Final   Organism ID, Bacteria STAPHYLOCOCCUS AUREUS  Final      Susceptibility   Staphylococcus aureus - MIC*    CIPROFLOXACIN <=0.5 SENSITIVE Sensitive     ERYTHROMYCIN <=0.25 SENSITIVE Sensitive     GENTAMICIN <=0.5 SENSITIVE Sensitive     OXACILLIN <=0.25 SENSITIVE Sensitive     TETRACYCLINE <=1 SENSITIVE Sensitive     VANCOMYCIN <=0.5 SENSITIVE Sensitive     TRIMETH/SULFA <=10 SENSITIVE Sensitive     CLINDAMYCIN <=0.25 SENSITIVE Sensitive     RIFAMPIN <=0.5 SENSITIVE Sensitive     Inducible Clindamycin NEGATIVE Sensitive     * RARE STAPHYLOCOCCUS AUREUS   Streptococcus mitis/oralis - MIC*    TETRACYCLINE 0.5 SENSITIVE Sensitive     VANCOMYCIN 0.5 SENSITIVE Sensitive     CLINDAMYCIN <=0.25 SENSITIVE Sensitive     * RARE STREPTOCOCCUS  MITIS/ORALIS  Aerobic/Anaerobic Culture w Gram Stain (surgical/deep wound)     Status: None   Collection Time: 11/11/20  4:26 PM   Specimen: Other Source; Tissue  Result Value Ref Range Status   Specimen Description   Final    WOUND LEFT METATARSAL Performed at [facility], [address], [place], [place] [address]    Special Requests   Final    NONE Performed at [facility], [address] [place], [place] [address]    Gram Stain NO WBC SEEN NO ORGANISMS SEEN   Final   Culture   Final    RARE BACTEROIDES SPECIES BETA LACTAMASE POSITIVE RARE PEPTOSTREPTOCOCCUS MICROS Standardized susceptibility testing for this organism is not available. Performed at [facility], [place] [address], [place], [place] [address]    Report Status 11/17/2020 FINAL  Final  MRSA Next Gen by PCR, Nasal     Status: None   Collection Time: 11/13/20 11:22 AM   Specimen: Nasal Mucosa; Nasal Swab  Result Value Ref Range Status   MRSA by PCR Next Gen NOT DETECTED NOT DETECTED Final    Comment: (NOTE) The GeneXpert MRSA Assay (FDA approved for NASAL specimens only), is one component of a comprehensive MRSA colonization surveillance program. It is not intended to diagnose MRSA infection nor to guide or monitor treatment for MRSA infections. Test performance is not FDA approved in patients less than 82 years old. Performed at [facility], [address] [place], [place] [address]   CULTURE, BLOOD (ROUTINE X 2) w Reflex to ID Panel     Status: None   Collection Time: 11/13/20 12:44 PM   Specimen: BLOOD  Result Value Ref Range Status   Specimen Description BLOOD LEFT ANTECUBITAL  Final   Special Requests   Final    BOTTLES DRAWN AEROBIC AND ANAEROBIC Blood Culture adequate volume   Culture   Final    NO GROWTH 5 DAYS Performed at [facility], [address] [place], [place] [address]    Report Status 11/18/2020 FINAL  Final  CULTURE, BLOOD (ROUTINE X  2) w Reflex to ID Panel     Status: None   Collection Time: 11/13/20 12:52 PM   Specimen: BLOOD  Result Value Ref Range Status   Specimen Description BLOOD BLOOD LEFT HAND  Final   Special Requests   Final    BOTTLES DRAWN AEROBIC ONLY Blood Culture results may not be optimal due to an inadequate volume of blood received in culture bottles   Culture   Final    NO GROWTH 5 DAYS Performed at [facility], [address], [place], [place] [address]    Report Status 11/18/2020 FINAL  Final  Culture, Respiratory w Gram Stain     Status: None   Collection Time: 11/13/20  3:43 PM   Specimen: Tracheal Aspirate; Respiratory  Result Value Ref Range Status   Specimen Description   Final    TRACHEAL ASPIRATE Performed at [facility], [address], [place], [place] [address]    Special Requests   Final    NONE Performed at [facility], [place], [place] [address]    Gram Stain   Final    NO SQUAMOUS EPITHELIAL CELLS SEEN FEW WBC SEEN NO ORGANISMS SEEN Performed at [facility], [place] [address], [place],  [address]    Culture RARE CANDIDA ALBICANS  Final   Report Status 11/16/2020 FINAL  Final    Coagulation Studies: No results for input(s): LABPROT, INR in the last 72 hours.   Urinalysis: No results for input(s): COLORURINE, LABSPEC, PHURINE, GLUCOSEU, HGBUR, BILIRUBINUR, KETONESUR, PROTEINUR, UROBILINOGEN, NITRITE, LEUKOCYTESUR in the last 72 hours.  Invalid input(s): APPERANCEUR     Imaging: US Carotid Bilateral  Result Date: 11/19/2020 CLINICAL DATA:  Stroke. EXAM: BILATERAL CAROTID DUPLEX ULTRASOUND TECHNIQUE: Pearline Cables scale imaging, color Doppler and duplex ultrasound were performed of bilateral carotid and vertebral arteries  in the neck. COMPARISON:  None. FINDINGS: Examination is degraded secondary to patient's inability to tolerate appropriate positioning. Criteria: Quantification of carotid stenosis is based on  velocity parameters that correlate the residual internal carotid diameter with NASCET-based stenosis levels, using the diameter of the distal internal carotid lumen as the denominator for stenosis measurement. The following velocity measurements were obtained: RIGHT ICA: 44/21 cm/sec CCA: 81/82 cm/sec SYSTOLIC ICA/CCA RATIO:  1.2 ECA: 44 cm/sec LEFT ICA: 75/21 cm/sec CCA: 99/37 cm/sec SYSTOLIC ICA/CCA RATIO:  1.8 ECA: 91 cm/sec RIGHT CAROTID ARTERY: There is a moderate to large amount of eccentric echogenic plaque within the right carotid bulb (image 18). There is a large amount of irregular mixed echogenic plaque involving the origin and proximal aspects of the right internal carotid artery (image 25), which results in a sonographic string sign (image 26) and markedly blunted monophasic waveform within the proximal aspect of the right internal carotid artery (image 27). RIGHT VERTEBRAL ARTERY:  Antegrade Flow LEFT CAROTID ARTERY: There is a moderate amount of intimal thickening/atherosclerotic plaque scattered throughout the left common carotid artery. There is a moderate to large amount of eccentric partially shadowing plaque within the left carotid bulb (image 52). There is a moderate amount of eccentric echogenic partially shadowing plaque involving the origin and proximal aspects of the left internal carotid artery (image 58), not definitely resulting in elevated peak systolic velocities in the left internal carotid artery, though note, velocity measurements may be unreliable in the setting of a suspected contralateral subtotal occlusion. LEFT VERTEBRAL ARTERY:  Antegrade flow IMPRESSION: 1. Large amount of right-sided atherosclerotic plaque results in a sonographic string sign and markedly blunted monophasic waveform involving the proximal aspect of the right internal carotid artery worrisome for a subtotal occlusion. Further evaluation with CTA could be performed as indicated. 2. Moderate to large amount of  left-sided atherosclerotic plaque, not definitely resulting in a hemodynamically significant stenosis within the left internal carotid artery, though note, velocity measurements may be unreliable in the setting of a suspected contralateral subtotal occlusion. Again, this could be further evaluated with CTA as indicated. 3. Antegrade flow demonstrated within the bilateral vertebral arteries. These results will be called to the ordering clinician or representative by the Radiologist Assistant, and communication documented in the PACS or [employer]. Electronically Signed   By: Sandi Mariscal M.D.   On: 11/19/2020 08:36   DG Abd Portable 1V  Result Date: 11/17/2020 CLINICAL DATA:  Check gastric catheter placement EXAM: PORTABLE ABDOMEN - 1 VIEW COMPARISON:  11/15/2020 FINDINGS: Weighted feeding catheter is again noted in the distal stomach directed towards the pool or so. No free air is seen. IMPRESSION: Weighted feeding catheter in the distal stomach. Electronically Signed   By: Inez Catalina M.D.   On: 11/17/2020 19:21     Medications:    sodium chloride 5 mL/hr at 11/18/20 0700   sodium chloride     ampicillin-sulbactam (UNASYN) IV 3 g (11/19/20 1048)   feeding supplement (VITAL 1.5 CAL) 1,000 mL (11/18/20 0531)   lactated ringers 75 mL/hr at 11/19/20 0049    sodium chloride   Intravenous Once   allopurinol  200 mg Per Tube Daily   vitamin C  500 mg Per Tube BID   budesonide (PULMICORT) nebulizer solution  0.5 mg Nebulization BID   Chlorhexidine Gluconate Cloth  6 each Topical Daily   diltiazem  60 mg Per Tube Q6H   docusate  100 mg Per Tube BID   ferrous gluconate  324  mg Oral Q breakfast   free water  30 mL Per Tube Q4H   insulin aspart  0-6 Units Subcutaneous Q4H   ipratropium-albuterol  3 mL Nebulization TID   levothyroxine  100 mcg Per Tube Q0600   mouth rinse  15 mL Mouth Rinse BID   metoprolol tartrate  50 mg Per Tube BID   nutrition supplement (JUVEN)  1 packet Per Tube BID BM    pantoprazole (PROTONIX) IV  40 mg Intravenous Daily   polyethylene glycol  17 g Per Tube Daily   sodium chloride flush  3 mL Intravenous Q12H   venlafaxine  100 mg Per Tube BID   sodium chloride, sodium chloride, [DISCONTINUED] acetaminophen **OR** acetaminophen, acetaminophen, albuterol, diltiazem, meclizine, ondansetron **OR** ondansetron (ZOFRAN) IV, sodium chloride flush  Assessment/ Plan:  Ms. WILMARY LEVIT is a 70 y.o.  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  Adequate urine output recorded for day shift yesterday, no output recorded on nights.  No acute need for dialysis at this time.  We will continue to monitor    #Diabetes type 2 With CKD including proteinuria Lisinopril held due to current illness.  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  Gauze dressing clean dry and intact.   #Anemia of CKD Hemoglobin improved to 8.5 today.  Received 2 unit blood transfusion yesterday   LOS: 10   11/8/202211:04 AM

## 2020-11-20 DIAGNOSIS — R6520 Severe sepsis without septic shock: Secondary | ICD-10-CM | POA: Diagnosis not present

## 2020-11-20 DIAGNOSIS — M86172 Other acute osteomyelitis, left ankle and foot: Secondary | ICD-10-CM | POA: Diagnosis not present

## 2020-11-20 DIAGNOSIS — N179 Acute kidney failure, unspecified: Secondary | ICD-10-CM | POA: Diagnosis not present

## 2020-11-20 DIAGNOSIS — I4891 Unspecified atrial fibrillation: Secondary | ICD-10-CM | POA: Diagnosis not present

## 2020-11-20 DIAGNOSIS — R296 Repeated falls: Secondary | ICD-10-CM | POA: Diagnosis not present

## 2020-11-20 DIAGNOSIS — I6523 Occlusion and stenosis of bilateral carotid arteries: Secondary | ICD-10-CM | POA: Diagnosis not present

## 2020-11-20 DIAGNOSIS — I639 Cerebral infarction, unspecified: Secondary | ICD-10-CM | POA: Diagnosis not present

## 2020-11-20 DIAGNOSIS — A419 Sepsis, unspecified organism: Secondary | ICD-10-CM | POA: Diagnosis not present

## 2020-11-20 LAB — CBC WITH DIFFERENTIAL/PLATELET
Abs Immature Granulocytes: 0.41 10*3/uL — ABNORMAL HIGH (ref 0.00–0.07)
Basophils Absolute: 0 10*3/uL (ref 0.0–0.1)
Basophils Relative: 0 %
Eosinophils Absolute: 0.2 10*3/uL (ref 0.0–0.5)
Eosinophils Relative: 2 %
HCT: 26.5 % — ABNORMAL LOW (ref 36.0–46.0)
Hemoglobin: 8.4 g/dL — ABNORMAL LOW (ref 12.0–15.0)
Immature Granulocytes: 4 %
Lymphocytes Relative: 15 %
Lymphs Abs: 1.6 10*3/uL (ref 0.7–4.0)
MCH: 29.6 pg (ref 26.0–34.0)
MCHC: 31.7 g/dL (ref 30.0–36.0)
MCV: 93.3 fL (ref 80.0–100.0)
Monocytes Absolute: 0.6 10*3/uL (ref 0.1–1.0)
Monocytes Relative: 6 %
Neutro Abs: 7.9 10*3/uL — ABNORMAL HIGH (ref 1.7–7.7)
Neutrophils Relative %: 73 %
Platelets: 108 10*3/uL — ABNORMAL LOW (ref 150–400)
RBC: 2.84 MIL/uL — ABNORMAL LOW (ref 3.87–5.11)
RDW: 16.4 % — ABNORMAL HIGH (ref 11.5–15.5)
WBC: 10.7 10*3/uL — ABNORMAL HIGH (ref 4.0–10.5)
nRBC: 0.2 % (ref 0.0–0.2)

## 2020-11-20 LAB — GLUCOSE, CAPILLARY
Glucose-Capillary: 116 mg/dL — ABNORMAL HIGH (ref 70–99)
Glucose-Capillary: 139 mg/dL — ABNORMAL HIGH (ref 70–99)
Glucose-Capillary: 149 mg/dL — ABNORMAL HIGH (ref 70–99)
Glucose-Capillary: 151 mg/dL — ABNORMAL HIGH (ref 70–99)
Glucose-Capillary: 161 mg/dL — ABNORMAL HIGH (ref 70–99)
Glucose-Capillary: 171 mg/dL — ABNORMAL HIGH (ref 70–99)
Glucose-Capillary: 187 mg/dL — ABNORMAL HIGH (ref 70–99)

## 2020-11-20 LAB — COMPREHENSIVE METABOLIC PANEL
ALT: 16 U/L (ref 0–44)
AST: 20 U/L (ref 15–41)
Albumin: 2.3 g/dL — ABNORMAL LOW (ref 3.5–5.0)
Alkaline Phosphatase: 58 U/L (ref 38–126)
Anion gap: 9 (ref 5–15)
BUN: 81 mg/dL — ABNORMAL HIGH (ref 8–23)
CO2: 26 mmol/L (ref 22–32)
Calcium: 9 mg/dL (ref 8.9–10.3)
Chloride: 107 mmol/L (ref 98–111)
Creatinine, Ser: 3.32 mg/dL — ABNORMAL HIGH (ref 0.44–1.00)
GFR, Estimated: 15 mL/min — ABNORMAL LOW (ref >60)
Glucose, Bld: 132 mg/dL — ABNORMAL HIGH (ref 70–99)
Potassium: 5 mmol/L (ref 3.5–5.1)
Sodium: 142 mmol/L (ref 135–145)
Total Bilirubin: 0.6 mg/dL (ref 0.3–1.2)
Total Protein: 5.5 g/dL — ABNORMAL LOW (ref 6.5–8.1)

## 2020-11-20 LAB — PHOSPHORUS: Phosphorus: 4 mg/dL (ref 2.5–4.6)

## 2020-11-20 LAB — MAGNESIUM: Magnesium: 2.1 mg/dL (ref 1.7–2.4)

## 2020-11-20 LAB — HEPARIN INDUCED PLATELET AB (HIT ANTIBODY): Heparin Induced Plt Ab: 0.097 OD (ref 0.000–0.400)

## 2020-11-20 MED ORDER — ATORVASTATIN CALCIUM 20 MG PO TABS
20.0000 mg | ORAL_TABLET | Freq: Every day | ORAL | Status: DC
Start: 2020-11-20 — End: 2020-11-21
  Administered 2020-11-20: 20 mg via ORAL
  Filled 2020-11-20: qty 1

## 2020-11-20 MED ORDER — ADULT MULTIVITAMIN W/MINERALS CH
1.0000 | ORAL_TABLET | Freq: Every day | ORAL | Status: DC
  Administered 2020-11-20 – 2020-12-21 (×28): 1 via ORAL
  Filled 2020-11-20 (×28): qty 1

## 2020-11-20 MED ORDER — ENSURE ENLIVE PO LIQD
237.0000 mL | Freq: Three times a day (TID) | ORAL | Status: DC
  Administered 2020-11-20 – 2020-11-26 (×9): 237 mL via ORAL

## 2020-11-20 MED ORDER — APIXABAN 5 MG PO TABS
5.0000 mg | ORAL_TABLET | Freq: Two times a day (BID) | ORAL | Status: DC
  Administered 2020-11-21: 5 mg via ORAL
  Filled 2020-11-20: qty 1

## 2020-11-20 MED ORDER — INSULIN ASPART 100 UNIT/ML IJ SOLN
0.0000 [IU] | Freq: Three times a day (TID) | INTRAMUSCULAR | Status: DC
Start: 2020-11-21 — End: 2020-11-23
  Administered 2020-11-21 – 2020-11-23 (×2): 1 [IU] via SUBCUTANEOUS
  Filled 2020-11-20: qty 1

## 2020-11-20 NOTE — Progress Notes (Signed)
Nutrition Follow-up  DOCUMENTATION CODES:   Obesity unspecified  INTERVENTION:   -D/c Vital 1.5, due to no feeding access -Ensure Enlive po TID, each supplement provides 350 kcal and 20 grams of protein  -MVI with minerals daily  NUTRITION DIAGNOSIS:   Increased nutrient needs related to wound healing as evidenced by estimated needs.  Ongoing  GOAL:   Patient will meet greater than or equal to 90% of their needs  Progressing  MONITOR:   PO intake, Supplement acceptance, Diet advancement, Labs, Weight trends, Skin, I & O's  REASON FOR ASSESSMENT:   Consult Wound healing  ASSESSMENT:   Patient is a 69 year old female who presents to the ER for evaluation of frequent falls and is found to be septic from left foot osteomyelitis and also in rapid A. fib.  10/31- s/p  Procedure(s): AMPUTATION RAY - 2nd, bone biopsy 3rd metatarsal head 11/2- intubated 11/3- MRI of brain revealed acute lt precentral gyrus stroke, extubated 11/4- s/p BSE- advanced to dysphagia 1 diet with nectar thick liquids 11/8- s/p BSE- advanced to dysphagia 2 diet with thin liquids, NGT d/c  Reviewed I/O's: +1.8 L x 24 hours and +10.7 L since admission  UOP: 1.2 L x 24 hours   Vascular surgery evaluation pending imaging.   Spoke with pt at bedside, who reports feeling better today. She just met with SLP earlier. She shares good appetite- noted she consumed a few bites of pudding and yogurt, some milk, and about 50% of eggs. Pt also enjoys drinking beverages. Noted meal completions 10-50%, which has improved since diet advancement.   Discussed importance of good meal and supplement intake to promote healing. Pt amenable to supplements.   Pt with poor oral intake and would benefit from nutrient dense supplement. One Ensure Enlive supplement provides 350 kcals, 20 grams protein, and 44-45 grams of carbohydrate vs one Glucerna shake supplement, which provides 220 kcals, 10 grams of protein, and 26 grams  of carbohydrate. Given pt's hx of DM, RD will reassess adequacy of PO intake, CBGS, and adjust supplement regimen as appropriate at follow-up.    Palliative care following for goals of care discussions.   Medications reviewed and include vitamin C, cardizem, colace, and miralax.   Labs reviewed: CBGS: 116-187 (inpatient orders for glycemic control are 0-6 units insulin aspart every 4 hours).    Diet Order:   Diet Order             DIET DYS 2 Room service appropriate? Yes with Assist; Fluid consistency: Thin  Diet effective now                   EDUCATION NEEDS:   Education needs have been addressed  Skin:  Skin Assessment: Skin Integrity Issues: Skin Integrity Issues:: Diabetic Ulcer Diabetic Ulcer: lt foot  Last BM:  11/20/20 (via rectal tube)  Height:   Ht Readings from Last 1 Encounters:  11/09/20 _0  (1.676 m)    Weight:   Wt Readings from Last 1 Encounters:  11/20/20 95.8 kg    Ideal Body Weight:  59.1 kg  BMI:  Body mass index is 34.09 kg/m.  Estimated Nutritional Needs:   Kcal:  1800-2100kcal/day  Protein:  90-105g/day  Fluid:  1.5-1.8L/day    Loistine Chance, RD, LDN, [place] Registered Dietitian II Certified Diabetes Care and Education Specialist Please refer to AMION for RD and/or RD on-call/weekend/after hours pager

## 2020-11-20 NOTE — Progress Notes (Addendum)
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 Other comorbidities include sepsis without shock secondary to osteomyelitis and cellulitis left foot (IV zosyn), AKI on CKD, rapid afib (Cardizem + Heparin gtt), NSTEMI suspect demand ischemia, anemia acute on chronic. Transferred to ICU and intubated 11/2 after evolving neuro changes with unresponsiveness after coughing episode. 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    PT Comments    Pt alert, agreeable to therapy with articulation much improved compared to previous sessions. Pt is able to state name, talk about her canine companions, and provide feedback of current feelings. Pt is motivated but does fatigue with therapy session. Progressed bed mobility with min-mod A for coming to EOB w/ improvement in ability to utilize R hand and bicep/tricep for pushing. Pt performed several repetitions of reaching outside BOS while maintaining seated position and forward trunk leans with tactile assist for WB through RLE. Pt's LLE managed by second assist as pt continually attempts to utilize more dominate side with tasks. CIR remains primary recommendation. Skilled PT intervention is indicated to address deficits in function, mobility, and to return to PLOF as able.    PT contacted friend; reported PLOF. Independent for ADLs, utilized rollator at baseline as able in home. Assist for IADLs, and friends are available nearly 24/7. The patient remains an  excellent candidate for acute inpatient rehab.    Recommendations for follow up therapy are one component of a multi-disciplinary discharge planning process, led by the attending physician.  Recommendations may be updated based on patient status, additional functional criteria and insurance authorization.  Follow Up Recommendations  Acute inpatient rehab (3hours/day)     Assistance Recommended at Discharge Frequent or constant Supervision/Assistance  Equipment Recommendations  None recommended by PT    Recommendations for Other Services       Precautions / Restrictions Precautions Precautions: Fall Restrictions Weight Bearing Restrictions: Yes LLE Weight Bearing: Non weight bearing     Mobility  Bed Mobility Overal bed mobility: Needs Assistance Bed Mobility: Supine to Sit;Sit to Supine     Supine to sit: HOB elevated;Min assist;Mod assist Sit to supine: Max assist   General bed mobility comments: able to reach for bed rails w/ BUE and manage BLE but ultimately needs min-mod assist for trunk momentum    Transfers Overall transfer level: Needs assistance   Transfers: Bed to chair/wheelchair/BSC            Lateral/Scoot Transfers: Total assist;+2 physical assistance General transfer comment: NWB unsafe at this time as pt unable to maintain WB status    Ambulation/Gait                   Stairs             Wheelchair Mobility    Modified Rankin (Stroke Patients Only)       Balance Overall balance assessment: Needs assistance Sitting-balance support: Bilateral upper extremity supported (Single foot supported) Sitting balance-Leahy Scale: Fair Sitting balance - Comments: able to maintain balance without physical assist and reach outside BOS without LOB  Cognition Arousal/Alertness: Awake/alert Behavior During Therapy: WFL for tasks assessed/performed Overall Cognitive Status:  Impaired/Different from baseline Area of Impairment: Orientation;Memory;Following commands;Safety/judgement;Problem solving                 Orientation Level: Place;Disoriented to   Memory: Decreased recall of precautions;Decreased short-term memory Following Commands: Follows one step commands with increased time Safety/Judgement: Decreased awareness of safety;Decreased awareness of deficits Awareness: Intellectual Problem Solving: Slow processing;Decreased initiation;Difficulty sequencing;Requires verbal cues;Requires tactile cues General Comments: Stares into space, requiring redirection of task        Exercises General Exercises - Upper Extremity Shoulder Flexion: AAROM;Both;10 reps;Seated Shoulder Extension: AAROM;Both;10 reps;Seated Shoulder ABduction: AAROM;Both;10 reps;Seated Shoulder ADduction: AAROM;Both;10 reps;Seated Elbow Flexion: AROM;Strengthening;Both;10 reps;Seated Elbow Extension: AROM;Strengthening;Both;10 reps;Seated Wrist Flexion: AROM;Strengthening;Both;10 reps;Seated Wrist Extension: AROM;Strengthening;Both;10 reps;Seated Digit Composite Flexion: AROM;Strengthening;Both;10 reps;Seated Composite Extension: AROM;Strengthening;Both;10 reps;Seated Other Exercises Other Exercises: elevated bed to promote hip hinging with BUE reaching across tray table x 5 w/ attempts to WB through RLE w/ HHA for LLE WB restrictions Other Exercises: Scooting forward and backward, easier with forward scooting, backward was able to scoot x 1, needed MAX A for final scoot Other Exercises: Rolling x 2 for linen change and pt clean up w/ pt able to roll w/ CGA - MIN A    General Comments General comments (skin integrity, edema, etc.): Heart rate maintained below 115 bpm w/ activity      Pertinent Vitals/Pain Pain Assessment: No/denies pain Faces Pain Scale: No hurt Pain Location: LLE with mobility/touch Pain Descriptors / Indicators: Grimacing Pain Intervention(s): Monitored  during session    Home Living                          Prior Function            PT Goals (current goals can now be found in the care plan section) Progress towards PT goals: Progressing toward goals    Frequency    7X/week      PT Plan Current plan remains appropriate    Co-evaluation              AM-PAC PT "6 Clicks" Mobility   Outcome Measure  Help needed turning from your back to your side while in a flat bed without using bedrails?: A Little Help needed moving from lying on your back to sitting on the side of a flat bed without using bedrails?: A Lot Help needed moving to and from a bed to a chair (including a wheelchair)?: Total Help needed standing up from a chair using your arms (e.g., wheelchair or bedside chair)?: Total Help needed to walk in hospital room?: Total Help needed climbing 3-5 steps with a railing? : Total 6 Click Score: 9    End of Session   Activity Tolerance: Patient tolerated treatment well Patient left: in bed;with call bell/phone within reach Nurse Communication: Mobility status PT Visit Diagnosis: Other abnormalities of gait and mobility (R26.89);Hemiplegia and hemiparesis;Muscle weakness (generalized) (M62.81);History of falling (Z91.81) Hemiplegia - Right/Left: Right Hemiplegia - dominant/non-dominant: Dominant     Time: 3491-7915 PT Time Calculation (min) (ACUTE ONLY): 50 min  Charges:                        [employer], SPT

## 2020-11-20 NOTE — Progress Notes (Signed)
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 Other comorbidities include sepsis without shock secondary to osteomyelitis and cellulitis left foot (IV zosyn), AKI on CKD, rapid afib (Cardizem + Heparin gtt), NSTEMI suspect demand ischemia, anemia acute on chronic. Transferred to ICU and intubated 11/2 after evolving neuro changes with unresponsiveness after coughing episode. 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   OT comments  [name] was seen for OT treatment on this date. Upon arrival to room pt reclined in bed eagerly greeting therapist for session. Improved communication this session with pt able to make needs known including using multiple 4 syllable words ("conglomerate"). Pt requires MAX A exit R side of bed, tolerates >30 min sitting c SBA. MAX A don B socks seated EOB. SETUP + SBA self-drinking seated EOB. MAX A for hair brushing and MOD A don/doff gown in sitting 2/2 limited R shoulder AROM. Pt completed seated BUE therex as described below.   The Orientation Log (O-Log) is designed to be a quick quantitative measure of orientational status for use at bedside with rehabilitation inpatients. Place, time, and situational (Etiology/Event + Pathology/Deficits) domains are assessed and can be tracked over time. Patient responses are scored based on spontaneous recall, logical cueing, multiple choice, and incorrect responses. Pt scored 14/30. Pt states year with free recall, needs  logical cuing for month/day of week, and is unable to state situation/symptoms despite cueing. Pt continues to demonstrated improved command following for 1 step commands, unable to sequence multi step commands with demonstration or multimodal cues limiting ability to maintain LLE NWBing to progress mobility. Trialed lateral scoot t/f along EOB however pt only completes anterior scooting despite cueing/demonstration (likely r/t cognitive deficits) ultimately requiring TOTAL A for lateral scoot t/f to maintain NWBing and for sequencing.   Pt making good progress toward goals. Pt continues to benefit from skilled OT services to maximize return to PLOF and minimize risk of future falls, injury, caregiver burden, and readmission. Will continue to follow POC. Pt continues to be an excellent candidate for AIR.     Recommendations for follow up therapy are one component of a multi-disciplinary discharge planning process, led by the attending physician.  Recommendations may be updated based on patient status, additional functional criteria and insurance authorization.    Follow Up Recommendations  Acute inpatient rehab (3hours/day)    Assistance Recommended at Discharge Frequent or constant Supervision/Assistance  Equipment Recommendations  Other (comment) (defer to venue of care)    Recommendations for Other Services Rehab consult    Precautions / Restrictions Precautions Precautions: Fall Restrictions Weight Bearing Restrictions: Yes LLE Weight Bearing: Non weight bearing       Mobility Bed Mobility Overal bed mobility: Needs Assistance Bed Mobility: Supine to Sit;Sit to Supine     Supine to sit: Max assist;HOB elevated Sit to supine: Max assist        Transfers Overall transfer level: Needs assistance   Transfers: Bed to chair/wheelchair/BSC            Lateral/Scoot Transfers: Total assist;+2 physical assistance  Balance Overall balance assessment: Needs  assistance Sitting-balance support: Single extremity supported;Feet unsupported Sitting balance-Leahy Scale: Fair                                     ADL either performed or assessed with clinical judgement   ADL Overall ADL's : Needs assistance/impaired                                       General ADL Comments: MAX A don B socks seated EOB. SETUP + SBA self-drinking seated EOB. MAX A for hair brushing 2/2 limited R shoulder AROM. MOD A don/doff gown.      Cognition Arousal/Alertness: Awake/alert Behavior During Therapy: WFL for tasks assessed/performed Overall Cognitive Status: Impaired/Different from baseline Area of Impairment: Orientation;Memory;Following commands;Safety/judgement;Problem solving                 Orientation Level: Disoriented to;Place;Situation   Memory: Decreased recall of precautions;Decreased short-term memory Following Commands: Follows one step commands with increased time Safety/Judgement: Decreased awareness of safety;Decreased awareness of deficits   Problem Solving: Slow processing;Decreased initiation;Difficulty sequencing;Requires verbal cues;Requires tactile cues General Comments: O-Log. Difficulty command following for exercises and transfers          Exercises Exercises: Other exercises;General Upper Extremity General Exercises - Upper Extremity Shoulder Flexion: AAROM;Both;10 reps;Seated Shoulder Extension: AAROM;Both;10 reps;Seated Shoulder ABduction: AAROM;Both;10 reps;Seated Shoulder ADduction: AAROM;Both;10 reps;Seated Elbow Flexion: AROM;Strengthening;Both;10 reps;Seated Elbow Extension: AROM;Strengthening;Both;10 reps;Seated Wrist Flexion: AROM;Strengthening;Both;10 reps;Seated Wrist Extension: AROM;Strengthening;Both;10 reps;Seated Digit Composite Flexion: AROM;Strengthening;Both;10 reps;Seated Composite Extension: AROM;Strengthening;Both;10 reps;Seated Other Exercises Other Exercises: Pt  educated re: OT role, DME recs, d/c recs, falls preveniton Other Exercises: LBD, UBD, sup<>sit, rolling, sitting/standing balance/tolerance, lateral scoot      General Comments max HR seen 129 with bed mobility    Pertinent Vitals/ Pain       Pain Assessment: Faces Faces Pain Scale: Hurts a little bit Pain Location: LLE with mobility/touch Pain Descriptors / Indicators: Grimacing Pain Intervention(s): Limited activity within patient's tolerance;Repositioned         Frequency  Min 5X/week        Progress Toward Goals  OT Goals(current goals can now be found in the care plan section)  Progress towards OT goals: Progressing toward goals  Acute Rehab OT Goals OT Goal Formulation: With patient Time For Goal Achievement: 11/28/20 Potential to Achieve Goals: Good ADL Goals Pt Will Perform Grooming: with min assist;sitting Pt/caregiver will Perform Home Exercise Program: Increased ROM;Increased strength;Both right and left upper extremity;With minimal assist Additional ADL Goal #1: Pt will follow 3/3 two-step commands with MOD cueing.  Plan Discharge plan remains appropriate;Frequency remains appropriate       AM-PAC OT "6 Clicks" Daily Activity     Outcome Measure   Help from another person eating meals?: A Lot Help from another person taking care of personal grooming?: A Lot Help from another person toileting, which includes using toliet, bedpan, or urinal?: A Lot Help from another person bathing (including washing, rinsing, drying)?: A Lot Help from another person to put on and taking off regular upper body clothing?: A Lot Help from another person to put on and taking off regular lower body clothing?: A Lot 6 Click Score: 12    End of Session    OT Visit Diagnosis: Other abnormalities of gait and mobility (R26.89);Hemiplegia  and hemiparesis;Muscle weakness (generalized) (M62.81) Hemiplegia - Right/Left: Right Hemiplegia - dominant/non-dominant: Dominant Hemiplegia  - caused by: Cerebral infarction   Activity Tolerance Patient tolerated treatment well   Patient Left in bed;with call bell/phone within reach;with bed alarm set;with nursing/sitter in room   Nurse Communication Mobility status        Time: 4259-5638 OT Time Calculation (min): 56 min  Charges: OT General Charges $OT Visit: 1 Visit OT Treatments $Self Care/Home Management : 38-52 mins $Therapeutic Exercise: 8-22 mins  Dessie Coma, M.S. OTR/L  11/20/20, 4:00 PM  ascom [phone]

## 2020-11-20 NOTE — Progress Notes (Signed)
[facility]  ROUNDING NOTE   Subjective:   Patient seen resting quietly Denies pain and discomfort Tolerating meals No complaints at this time  Creatinine at baseline  Objective:  Vital signs in last 24 hours:  Temp:  [97.4 F (36.3 C)-98.3 F (36.8 C)] 97.4 F (36.3 C) (11/09 1726) Pulse Rate:  [67-104] 104 (11/09 1726) Resp:  [16-18] 18 (11/09 1726) BP: (134-162)/(93-112) 162/112 (11/09 1726) SpO2:  [96 %-98 %] 98 % (11/09 1726) Weight:  [95.8 kg] 95.8 kg (11/09 0338)  Weight change: 0.2 kg Filed Weights   11/17/20 0500 11/19/20 0412 11/20/20 0338  Weight: 91.8 kg 95.6 kg 95.8 kg    Intake/Output: I/O last 3 completed shifts: In: 3406.3 [P.O.:720; I.V.:2439.1; IV Piggyback:247.2] Out: 1150 [Urine:1150]   Intake/Output this shift:  Total I/O In: 240 [P.O.:240] Out: 700 [Urine:700]  Physical Exam: General: NAD  Head: Atraumatic. Moist oral mucosal membranes  Eyes: Anicteric  Lungs:  Clear bilaterally, normal effort  Heart: Irregular rhythm  Abdomen:  Soft, nontender  Extremities: No peripheral edema.  Surgical dressing on left foot  Neurologic: Alert, able to answer simple questions  Skin: Warm        Basic Metabolic Panel: Recent Labs  Lab 11/16/20 0246 11/17/20 0815 11/18/20 0227 11/19/20 0543 11/20/20 0504  NA 139 139 143 141 142  K 3.9 4.1 4.4 4.6 5.0  CL 101 102 106 107 107  CO2 25 28 26 25 26   GLUCOSE 166* 175* 194* 136* 132*  BUN 99* 97* 95* 79* 81*  CREATININE 4.79* 4.24* 3.89* 3.36* 3.32*  CALCIUM 8.2* 8.5* 8.5* 8.8* 9.0  MG 2.4 2.1 2.0 2.1 2.1  PHOS 5.9* 4.9* 3.8 3.8 4.0     Liver Function Tests: Recent Labs  Lab 11/16/20 0246 11/17/20 0815 11/18/20 0227 11/19/20 0543 11/20/20 0504  AST 19 19 19 19 20   ALT 18 18 15 16 16   ALKPHOS 59 66 54 57 58  BILITOT 0.9 0.7 0.8 0.8 0.6  PROT 5.6* 5.8* 5.0* 4.9* 5.5*  ALBUMIN 2.1* 2.3* 2.0* 2.1* 2.3*    No results for input(s): LIPASE, AMYLASE in the last 168  hours. No results for input(s): AMMONIA in the last 168 hours.  CBC: Recent Labs  Lab 11/16/20 0246 11/17/20 0815 11/18/20 0227 11/18/20 1237 11/18/20 2038 11/19/20 0543 11/20/20 0504  WBC 10.8* 9.3 10.6*  --   --  12.5* 10.7*  NEUTROABS 8.5* 6.9 8.2*  --   --  9.6* 7.9*  HGB 8.0* 8.2* 6.4* 7.3* 9.0* 8.5* 8.4*  HCT 25.2* 26.2* 20.5* 23.0* 27.4* 26.6* 26.5*  MCV 86.3 88.8 89.1  --   --  91.4 93.3  PLT 168 134* 124*  --   --  104* 108*     Cardiac Enzymes: No results for input(s): CKTOTAL, CKMB, CKMBINDEX, TROPONINI in the last 168 hours.   BNP: Invalid input(s): POCBNP  CBG: Recent Labs  Lab 11/20/20 0440 11/20/20 0715 11/20/20 1105 11/20/20 1145 11/20/20 1716  GLUCAP 116* 151* 187* 161* 149*     Microbiology: Results for orders placed or performed during the hospital encounter of 11/09/20  Resp Panel by RT-PCR (Flu A&B, Covid) Nasopharyngeal Swab     Status: None   Collection Time: 11/09/20  6:25 PM   Specimen: Nasopharyngeal Swab; Nasopharyngeal(NP) swabs in vial transport medium  Result Value Ref Range Status   SARS Coronavirus 2 by RT PCR NEGATIVE NEGATIVE Final    Comment: (NOTE) SARS-CoV-2 target nucleic acids are NOT DETECTED.  The SARS-CoV-2 RNA is generally detectable in upper respiratory specimens during the acute phase of infection. The lowest concentration of SARS-CoV-2 viral copies this assay can detect is 138 copies/mL. A negative result does not preclude SARS-Cov-2 infection and should not be used as the sole basis for treatment or other patient management decisions. A negative result may occur with  improper specimen collection/handling, submission of specimen other than nasopharyngeal swab, presence of viral mutation(s) within the areas targeted by this assay, and inadequate number of viral copies(<138 copies/mL). A negative result must be combined with clinical observations, patient history, and epidemiological information. The expected  result is Negative.  Fact Sheet for Patients:  EntrepreneurPulse.com.au  Fact Sheet for Healthcare Providers:  IncredibleEmployment.be  This test is no t yet approved or cleared by the [place] FDA and  has been authorized for detection and/or diagnosis of SARS-CoV-2 by FDA under an Emergency Use Authorization (EUA). This EUA will remain  in effect (meaning this test can be used) for the duration of the COVID-19 declaration under Section 564(b)(1) of the Act, 21 U.S.C.section 360bbb-3(b)(1), unless the authorization is terminated  or revoked sooner.       Influenza A by PCR NEGATIVE NEGATIVE Final   Influenza B by PCR NEGATIVE NEGATIVE Final    Comment: (NOTE) The Xpert Xpress SARS-CoV-2/FLU/RSV plus assay is intended as an aid in the diagnosis of influenza from Nasopharyngeal swab specimens and should not be used as a sole basis for treatment. Nasal washings and aspirates are unacceptable for Xpert Xpress SARS-CoV-2/FLU/RSV testing.  Fact Sheet for Patients: EntrepreneurPulse.com.au  Fact Sheet for Healthcare Providers: IncredibleEmployment.be  This test is not yet approved or cleared by the [place] FDA and has been authorized for detection and/or diagnosis of SARS-CoV-2 by FDA under an Emergency Use Authorization (EUA). This EUA will remain in effect (meaning this test can be used) for the duration of the COVID-19 declaration under Section 564(b)(1) of the Act, 21 U.S.C. section 360bbb-3(b)(1), unless the authorization is terminated or revoked.  Performed at [facility], [address] [place], [place] [address]   MRSA Next Gen by PCR, Nasal     Status: None   Collection Time: 11/10/20  1:14 AM   Specimen: Nasal Mucosa; Nasal Swab  Result Value Ref Range Status   MRSA by PCR Next Gen NOT DETECTED NOT DETECTED Final    Comment: (NOTE) The GeneXpert MRSA Assay (FDA approved  for NASAL specimens only), is one component of a comprehensive MRSA colonization surveillance program. It is not intended to diagnose MRSA infection nor to guide or monitor treatment for MRSA infections. Test performance is not FDA approved in patients less than 26 years old. Performed at [facility], [address] [place], [place] [address]   Aerobic/Anaerobic Culture w Gram Stain (surgical/deep wound)     Status: None   Collection Time: 11/11/20  3:23 PM   Specimen: Foot, Left; Abscess  Result Value Ref Range Status   Specimen Description   Final    WOUND LEFT FOOT ABSCESS Performed at [facility], [address], [place], [place] [address]    Special Requests   Final    NONE Performed at [facility], [address] [place], [place] [address]    Gram Stain   Final    NO ORGANISMS SEEN SQUAMOUS EPITHELIAL CELLS PRESENT MODERATE WBC PRESENT, PREDOMINANTLY MONONUCLEAR MODERATE GRAM POSITIVE COCCI Performed at [facility], [place] [address] [place], [place] [address]  Culture   Final    RARE STAPHYLOCOCCUS AUREUS RARE PASTEURELLA MULTOCIDA Usually susceptible to penicillin and other beta lactam agents,quinolones,macrolides and tetracyclines. MIXED ANAEROBIC FLORA PRESENT.  CALL LAB IF FURTHER IID REQUIRED.    Report Status 11/17/2020 FINAL  Final   Organism ID, Bacteria STAPHYLOCOCCUS AUREUS  Final      Susceptibility   Staphylococcus aureus - MIC*    CIPROFLOXACIN <=0.5 SENSITIVE Sensitive     ERYTHROMYCIN <=0.25 SENSITIVE Sensitive     GENTAMICIN <=0.5 SENSITIVE Sensitive     OXACILLIN <=0.25 SENSITIVE Sensitive     TETRACYCLINE <=1 SENSITIVE Sensitive     VANCOMYCIN <=0.5 SENSITIVE Sensitive     TRIMETH/SULFA <=10 SENSITIVE Sensitive     CLINDAMYCIN <=0.25 SENSITIVE Sensitive     RIFAMPIN <=0.5 SENSITIVE Sensitive     Inducible Clindamycin NEGATIVE Sensitive     * RARE STAPHYLOCOCCUS AUREUS  Aerobic/Anaerobic Culture w Gram  Stain (surgical/deep wound)     Status: None   Collection Time: 11/11/20  3:32 PM   Specimen: Other Source; Tissue  Result Value Ref Range Status   Specimen Description   Final    WOUND 2ND METATARSAL Performed at [facility], [address], [place], [place] [address]    Special Requests   Final    NONE Performed at [facility], [place], [place] [address]    Gram Stain   Final    NO ORGANISMS SEEN SQUAMOUS EPITHELIAL CELLS PRESENT FEW WBC PRESENT, PREDOMINANTLY MONONUCLEAR FEW GRAM POSITIVE COCCI Performed at [facility], [place] [address], [place], [place] [address]    Culture   Final    RARE STREPTOCOCCUS MITIS/ORALIS RARE PASTEURELLA MULTOCIDA Usually susceptible to penicillin and other beta lactam agents,quinolones,macrolides and tetracyclines. ABUNDANT BACTEROIDES SPECIES BETA LACTAMASE POSITIVE RARE STAPHYLOCOCCUS AUREUS    Report Status 11/17/2020 FINAL  Final   Organism ID, Bacteria STREPTOCOCCUS MITIS/ORALIS  Final   Organism ID, Bacteria STAPHYLOCOCCUS AUREUS  Final      Susceptibility   Staphylococcus aureus - MIC*    CIPROFLOXACIN <=0.5 SENSITIVE Sensitive     ERYTHROMYCIN <=0.25 SENSITIVE Sensitive     GENTAMICIN <=0.5 SENSITIVE Sensitive     OXACILLIN <=0.25 SENSITIVE Sensitive     TETRACYCLINE <=1 SENSITIVE Sensitive     VANCOMYCIN <=0.5 SENSITIVE Sensitive     TRIMETH/SULFA <=10 SENSITIVE Sensitive     CLINDAMYCIN <=0.25 SENSITIVE Sensitive     RIFAMPIN <=0.5 SENSITIVE Sensitive     Inducible Clindamycin NEGATIVE Sensitive     * RARE STAPHYLOCOCCUS AUREUS   Streptococcus mitis/oralis - MIC*    TETRACYCLINE 0.5 SENSITIVE Sensitive     VANCOMYCIN 0.5 SENSITIVE Sensitive     CLINDAMYCIN <=0.25 SENSITIVE Sensitive     * RARE STREPTOCOCCUS MITIS/ORALIS  Aerobic/Anaerobic Culture w Gram Stain (surgical/deep wound)     Status: None   Collection Time: 11/11/20  4:26 PM   Specimen: Other Source; Tissue  Result Value  Ref Range Status   Specimen Description   Final    WOUND LEFT METATARSAL Performed at [facility], [address], [place], [place] [address]    Special Requests   Final    NONE Performed at [facility], [place], [place] [address]    Gram Stain NO WBC SEEN NO ORGANISMS SEEN   Final   Culture   Final    RARE BACTEROIDES SPECIES BETA LACTAMASE POSITIVE RARE PEPTOSTREPTOCOCCUS MICROS Standardized susceptibility testing for this organism is not available.  Performed at [facility], [place] [address] [place], [place] [address]    Report Status 11/17/2020 FINAL  Final  MRSA Next Gen by PCR, Nasal     Status: None   Collection Time: 11/13/20 11:22 AM   Specimen: Nasal Mucosa; Nasal Swab  Result Value Ref Range Status   MRSA by PCR Next Gen NOT DETECTED NOT DETECTED Final    Comment: (NOTE) The GeneXpert MRSA Assay (FDA approved for NASAL specimens only), is one component of a comprehensive MRSA colonization surveillance program. It is not intended to diagnose MRSA infection nor to guide or monitor treatment for MRSA infections. Test performance is not FDA approved in patients less than 69 years old. Performed at [facility], [address] [place], [place] [address]   CULTURE, BLOOD (ROUTINE X 2) w Reflex to ID Panel     Status: None   Collection Time: 11/13/20 12:44 PM   Specimen: BLOOD  Result Value Ref Range Status   Specimen Description BLOOD LEFT ANTECUBITAL  Final   Special Requests   Final    BOTTLES DRAWN AEROBIC AND ANAEROBIC Blood Culture adequate volume   Culture   Final    NO GROWTH 5 DAYS Performed at [facility], [address] [place], [place] [address]    Report Status 11/18/2020 FINAL  Final  CULTURE, BLOOD (ROUTINE X 2) w Reflex to ID Panel     Status: None   Collection Time: 11/13/20 12:52 PM   Specimen: BLOOD  Result Value Ref Range Status   Specimen Description BLOOD BLOOD LEFT HAND   Final   Special Requests   Final    BOTTLES DRAWN AEROBIC ONLY Blood Culture results may not be optimal due to an inadequate volume of blood received in culture bottles   Culture   Final    NO GROWTH 5 DAYS Performed at [facility], [address], [place], [place] [address]    Report Status 11/18/2020 FINAL  Final  Culture, Respiratory w Gram Stain     Status: None   Collection Time: 11/13/20  3:43 PM   Specimen: Tracheal Aspirate; Respiratory  Result Value Ref Range Status   Specimen Description   Final    TRACHEAL ASPIRATE Performed at [facility], [address], [place], [place] [address]    Special Requests   Final    NONE Performed at [facility], [place], [place] [address]    Gram Stain   Final    NO SQUAMOUS EPITHELIAL CELLS SEEN FEW WBC SEEN NO ORGANISMS SEEN Performed at [facility], [place] [address] [place],  [address]    Culture RARE CANDIDA ALBICANS  Final   Report Status 11/16/2020 FINAL  Final    Coagulation Studies: No results for input(s): LABPROT, INR in the last 72 hours.   Urinalysis: No results for input(s): COLORURINE, LABSPEC, PHURINE, GLUCOSEU, HGBUR, BILIRUBINUR, KETONESUR, PROTEINUR, UROBILINOGEN, NITRITE, LEUKOCYTESUR in the last 72 hours.  Invalid input(s): APPERANCEUR     Imaging: CT HEAD WO CONTRAST (5MM)  Result Date: 11/19/2020 CLINICAL DATA:  Stroke follow-up.  Rule out hemorrhage. EXAM: CT HEAD WITHOUT CONTRAST TECHNIQUE: Contiguous axial images were obtained from the base of the skull through the vertex without intravenous contrast. COMPARISON:  Brain MRI dated 11/19/2020.  Head CT dated 11/13/2020. FINDINGS: Brain: Mild age-related atrophy and chronic microvascular ischemic changes. Focal area of white matter hypodensity in the left frontal convexity noted which appears more conspicuous  compared to the CT of 11/13/2020. Or there is no acute intracranial hemorrhage. No mass  effect midline shift no extra-axial fluid collection. Vascular: No hyperdense vessel or unexpected calcification. Skull: Normal. Negative for fracture or focal lesion. Sinuses/Orbits: No acute finding. Other: None IMPRESSION: 1. No acute intracranial hemorrhage. 2. Mild age-related atrophy and chronic microvascular ischemic changes. 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 Electronically Signed   By: Anner Crete M.D.   On: 11/19/2020 22:35   MR ANGIO HEAD WO CONTRAST  Result Date: 11/19/2020 CLINICAL DATA:  Stroke, follow up. EXAM: MRA NECK WITHOUT CONTRAST MRA HEAD WITHOUT CONTRAST TECHNIQUE: Angiographic images of the Circle of Willis were acquired using MRA technique without intravenous contrast. COMPARISON:  None. FINDINGS: MRA NECK FINDINGS The study is degraded by motion, particularly at the skull base. Luminal irregularity along the petrous segment of the bilateral internal carotid arteries with apparent high-grade stenosis, likely artifactual. Mild luminal irregularity of the cavernous segment of the bilateral internal carotid arteries. Normal caliber and flow related enhancement of the bilateral ACA vascular trees. Attenuation of flow in the M3/MCA branches bilaterally is likely artifactual. Normal caliber and flow related enhancement of the visualized portions of the V4 segment of the bilateral vertebral arteries. The basilar artery and bilateral posterior cerebral arteries are maintained. MRA HEAD FINDINGS The study is severely degraded by motion, particularly at the level of the carotid bifurcations. Normal flow related enhancement seen in the visualized portions of the bilateral common carotid artery. Attenuation of flow at the level of the carotid bulbs and in the distal cervical segment may be artifactual. Note is made of retropharyngeal course of the bilateral common carotid arteries and proximal cervical internal carotid arteries. Normal caliber and flow related  enhancement of the visualized V2 segment of the bilateral vertebral arteries. Attenuation of flow at the V3 segment is artifactual. IMPRESSION: 1. Motion degraded study significantly limiting evaluation. Consider repeat study or CT angiogram of the head and neck when clinically appropriate. 2. Luminal irregularity with high-grade stenosis in the petrous segment of the bilateral internal carotid arteries are likely artifactual. Likewise, attenuation of flow in the bilateral M3/MCA segments are likely artifactual. However, superimposed atherosclerotic stenosis cannot be excluded. 3. 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 4. Normal caliber of the visualized portions of the common carotid arteries and proximal carotid bifurcation with attenuation of flow in the bulbs and in the distal cervical segments, likely artifactual. Electronically Signed   By: Pedro Earls M.D.   On: 11/19/2020 15:41   MR ANGIO NECK WO CONTRAST  Result Date: 11/19/2020 CLINICAL DATA:  Stroke, follow up. EXAM: MRA NECK WITHOUT CONTRAST MRA HEAD WITHOUT CONTRAST TECHNIQUE: Angiographic images of the Circle of Willis were acquired using MRA technique without intravenous contrast. COMPARISON:  None. FINDINGS: MRA NECK FINDINGS The study is degraded by motion, particularly at the skull base. Luminal irregularity along the petrous segment of the bilateral internal carotid arteries with apparent high-grade stenosis, likely artifactual. Mild luminal irregularity of the cavernous segment of the bilateral internal carotid arteries. Normal caliber and flow related enhancement of the bilateral ACA vascular trees. Attenuation of flow in the M3/MCA branches bilaterally is likely artifactual. Normal caliber and flow related enhancement of the visualized portions of the V4 segment of the bilateral vertebral arteries. The basilar artery and bilateral posterior cerebral arteries are maintained. MRA HEAD FINDINGS The  study is severely degraded by motion, particularly at the level of the carotid  bifurcations. Normal flow related enhancement seen in the visualized portions of the bilateral common carotid artery. Attenuation of flow at the level of the carotid bulbs and in the distal cervical segment may be artifactual. Note is made of retropharyngeal course of the bilateral common carotid arteries and proximal cervical internal carotid arteries. Normal caliber and flow related enhancement of the visualized V2 segment of the bilateral vertebral arteries. Attenuation of flow at the V3 segment is artifactual. IMPRESSION: 1. Motion degraded study significantly limiting evaluation. Consider repeat study or CT angiogram of the head and neck when clinically appropriate. 2. Luminal irregularity with high-grade stenosis in the petrous segment of the bilateral internal carotid arteries are likely artifactual. Likewise, attenuation of flow in the bilateral M3/MCA segments are likely artifactual. However, superimposed atherosclerotic stenosis cannot be excluded. 3. 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 4. Normal caliber of the visualized portions of the common carotid arteries and proximal carotid bifurcation with attenuation of flow in the bulbs and in the distal cervical segments, likely artifactual. Electronically Signed   By: Pedro Earls M.D.   On: 11/19/2020 15:41   US Carotid Bilateral  Result Date: 11/19/2020 CLINICAL DATA:  Stroke. EXAM: BILATERAL CAROTID DUPLEX ULTRASOUND TECHNIQUE: Pearline Cables scale imaging, color Doppler and duplex ultrasound were performed of bilateral carotid and vertebral arteries in the neck. COMPARISON:  None. FINDINGS: Examination is degraded secondary to patient's inability to tolerate appropriate positioning. Criteria: Quantification of carotid stenosis is based on velocity parameters that correlate the residual internal carotid diameter with NASCET-based stenosis  levels, using the diameter of the distal internal carotid lumen as the denominator for stenosis measurement. The following velocity measurements were obtained: RIGHT ICA: 44/21 cm/sec CCA: 94/85 cm/sec SYSTOLIC ICA/CCA RATIO:  1.2 ECA: 44 cm/sec LEFT ICA: 75/21 cm/sec CCA: 46/27 cm/sec SYSTOLIC ICA/CCA RATIO:  1.8 ECA: 91 cm/sec RIGHT CAROTID ARTERY: There is a moderate to large amount of eccentric echogenic plaque within the right carotid bulb (image 18). There is a large amount of irregular mixed echogenic plaque involving the origin and proximal aspects of the right internal carotid artery (image 25), which results in a sonographic string sign (image 26) and markedly blunted monophasic waveform within the proximal aspect of the right internal carotid artery (image 27). RIGHT VERTEBRAL ARTERY:  Antegrade Flow LEFT CAROTID ARTERY: There is a moderate amount of intimal thickening/atherosclerotic plaque scattered throughout the left common carotid artery. There is a moderate to large amount of eccentric partially shadowing plaque within the left carotid bulb (image 52). There is a moderate amount of eccentric echogenic partially shadowing plaque involving the origin and proximal aspects of the left internal carotid artery (image 58), not definitely resulting in elevated peak systolic velocities in the left internal carotid artery, though note, velocity measurements may be unreliable in the setting of a suspected contralateral subtotal occlusion. LEFT VERTEBRAL ARTERY:  Antegrade flow IMPRESSION: 1. Large amount of right-sided atherosclerotic plaque results in a sonographic string sign and markedly blunted monophasic waveform involving the proximal aspect of the right internal carotid artery worrisome for a subtotal occlusion. Further evaluation with CTA could be performed as indicated. 2. Moderate to large amount of left-sided atherosclerotic plaque, not definitely resulting in a hemodynamically significant stenosis  within the left internal carotid artery, though note, velocity measurements may be unreliable in the setting of a suspected contralateral subtotal occlusion. Again, this could be further evaluated with CTA as indicated. 3. Antegrade flow demonstrated within the bilateral vertebral arteries. These results  will be called to the ordering clinician or representative by the Radiologist Assistant, and communication documented in the PACS or [employer]. Electronically Signed   By: Sandi Mariscal M.D.   On: 11/19/2020 08:36     Medications:    sodium chloride 5 mL/hr at 11/18/20 0700   sodium chloride     ampicillin-sulbactam (UNASYN) IV 3 g (11/20/20 1054)    sodium chloride   Intravenous Once   allopurinol  200 mg Per Tube Daily   vitamin C  500 mg Per Tube BID   atorvastatin  20 mg Oral QHS   budesonide (PULMICORT) nebulizer solution  0.5 mg Nebulization BID   Chlorhexidine Gluconate Cloth  6 each Topical Daily   diltiazem  60 mg Per Tube Q6H   docusate  100 mg Per Tube BID   feeding supplement  237 mL Oral TID BM   ferrous gluconate  324 mg Oral Q breakfast   insulin aspart  0-6 Units Subcutaneous Q4H   levothyroxine  100 mcg Per Tube Q0600   mouth rinse  15 mL Mouth Rinse BID   metoprolol tartrate  50 mg Per Tube Q6H   multivitamin with minerals  1 tablet Oral Daily   pantoprazole (PROTONIX) IV  40 mg Intravenous Daily   polyethylene glycol  17 g Per Tube Daily   sodium chloride flush  3 mL Intravenous Q12H   venlafaxine  100 mg Per Tube BID   sodium chloride, sodium chloride, [DISCONTINUED] acetaminophen **OR** acetaminophen, acetaminophen, albuterol, diltiazem, meclizine, ondansetron **OR** ondansetron (ZOFRAN) IV, sodium chloride flush  Assessment/ Plan:  Ms. Autumn [name] is a 70 y.o.  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 No acute need for dialysis at this time. Will need hospital follow up with nephrologist at discharge    #Diabetes type 2 With CKD including proteinuria Lisinopril held due to current illness.  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  Gauze dressing in place   #Anemia of CKD Hemoglobin 8.4. Iron supplement daily   LOS: 11   11/9/20226:04 PM

## 2020-11-20 NOTE — Progress Notes (Signed)
Neurology progress note  S: Patient continues to improve with respect to weakness and aphasia. Dysarthria remains severe but she is fully oriented and able to converse appropriately. Neurology asked to re-evaluate after carotid US showed significant disease. Patient remains in a fib with cardiology following. Heparin gtt was d/c'd for acute blood loss anemia.  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 Stroke most likely peri-procedural versus due to her atrial fibrillation this admission. Heparin gtt stopped for acute blood loss anemia. Head CT ordered to r/o interval hemorrhagic conversion prior to anticoagulation restart and is pending. Vascular surgery consulted 2/2 severe carotid disease on [place] and their recommendations are pending.  - F/u head CT - F/u vasc surg recommendations  Will continue to follow.  Su Monks, MD [facility] [phone]  If 7pm- 7am, please page neurology on call as listed in [place].

## 2020-11-20 NOTE — Progress Notes (Addendum)
PROGRESS NOTE    Autumn [name]  OJJ:[id number] DOB: Jul 16, 1950 DOA: 11/09/2020 PCP: Harlow Ohms, MD  [id number]   Assessment & Plan:   Principal Problem:   Sepsis [facility]) Active Problems:   Atrial fibrillation with RVR ([place])   Osteomyelitis of foot, left, acute ([place])   CKD stage 4 due to type 2 diabetes mellitus ([place])   Liver cirrhosis secondary to NASH (nonalcoholic steatohepatitis) ([place])   Hypertension   History of anemia due to CKD   Frequent falls   AKI (acute kidney injury) ([place])   Cellulitis and abscess of foot, except toes   Infectious tenosynovitis   [place]   Atrial fibrillation with rapid ventricular response (HCC)   Bilateral carotid artery stenosis   70 yo F with history of T2DM, CKD, 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  She'd been following with a [facility] podiatrist outpatient and had recently refused surgery.  She'd recently been treated with antibiotics at [facility] for the diabetic foot infection with enterobacter aerogans culture positive.  She was admitted on 10/29 due to falls at home with worsening L foot chronic diabetic ulcer with osteomyelitis of the 2nd and 3rd metatarsals and cellulitis noted on MRI.  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  Her post operative course was complicated by unresponsiveness and neurologic changes, she was found to have a stroke and was intubated for airway protection.  She was extubated 11/3 and transferred to [facility] service on 11/4.   Acute Blood Loss Anemia Iron def In setting of heparin and recent surgical procedure - postop bleeding from surgical wound --s/p 2 units pRBC  Labs with iron def Follow B12 elevated, folate wnl --monitor Hgb --cont iron suppl   Stroke Neurology suspects periprocedural vs related to atrial fibrillation MRI brian with acute to early subacute infarction in the L  posterior frontal cortical and subcortical brain, possibly affecting the precentral gyrus.   --Echo with EF 55-60%, dilated LA, patent foramen ovale --carotid US right ICA artery worrisome for a subtotal occlusion.  Moderate to large amount of L sided atheroscelotic plaque Plan: --Left carotid angiogram with Dr. Delana Meyer on Friday   Acute Metabolic Encephalopathy Related to acute infection and above Delirium precautions Follow and w/u further as indicated   Septic Shock Diabetic Foot Infection Left Foot Osteomyelitis and Cellulitis  Septic Arthritis MRI foot with septic arthritis of second MTP joint and osteomyelitis of 2nd metatarsal head and second proximal phalanx, severe soft tissue swelling around second metatarsal neck concerning for phlegmon developing abscess measuring 2.1x1 cm circumferentially around distal shaft.  Mild early osteomyelitis of 3rd metatarsal head.  Severe soft tissue edema of the forefoot c/w cellulitis. MRI ankle limited -> interval resection of 2nd metatarsal, longitudinal tear of peroneus brevis with peroneus tenosynovitis, distal tibialis posterior tenosynovitis and tendinopathy, extensor digitorum tenosynovitis.  Extensive subcutaneous edema circumferentially in distal calf.  Dorsal subcutaneous edema in foot. S/p 2nd ray amputation, I&D, and bone bx of 3rd metatarsal on 11/1 --ID consulted Plan: --cont Unasyn --NWB LLE --dressing change and wound VAC order by podiatry today   Acute Hypoxic Respiratory Failure Aspiration Pneumonia On RA CXR 11/3 with right lung opacity concerning for superimposed infection - also with findings concerning for pulmonary edema and small right effusion --cont Unasyn   Atrial Fibrillation with RVR Continue  metoprolol and diltiazem --resume anticoagulation with Eliquis  Thrombocytopenia --HIT ab neg.   Dysphagia 2/2 stroke SLP eval --tolerating dysphagia 1 nectar thick diet   AKI on CKD IV Baseline creatinine  around 3.2 --nephrology consulted --d/c MIVF today   Elevated Troponin Cardiology following, thought 2/2 demand   Hx NASH cirrhosis --compensated    Hypothyroidism --cont Synthroid   T2DM SSI, follow A1c 6.3   DVT prophylaxis: [id number] Code Status: Full code  Family Communication:  Level of care: Progressive Cardiac Dispo:   The patient is from: home Anticipated d/c is to: [facility] Anticipated d/c date is: 2-3 days Patient currently is not medically ready to d/c due to: pending carotid angiogram on Friday   Subjective and Interval History:  Pt reported feeling cold, and speech has been improving.  Said she was drinking her Ensure and liked her tea.   Objective: Vitals:   11/20/20 0858 11/20/20 1058 11/20/20 1726 11/20/20 1943  BP:  (!) 134/98 (!) 162/112 (!) 138/110  Pulse:  92 (!) 104 (!) 108  Resp:  17 18 20   Temp:  98.3 F (36.8 C) (!) 97.4 F (36.3 C) 98.1 F (36.7 C)  TempSrc:      SpO2: 98% 97% 98% 96%  Weight:      Height:        Intake/Output Summary (Last 24 hours) at 11/20/2020 2304 Last data filed at 11/20/2020 1100 Gross per 24 hour  Intake 1247.07 ml  Output 1200 ml  Net 47.07 ml   Filed Weights   11/17/20 0500 11/19/20 0412 11/20/20 0338  Weight: 91.8 kg 95.6 kg 95.8 kg    Examination:   Constitutional: NAD, alert, expressive aphasia HEENT: conjunctivae and lids normal, EOMI CV: No cyanosis.   RESP: normal respiratory effort, on RA Extremities: No effusions, edema in BLE SKIN: warm, dry   Data Reviewed: I have personally reviewed following labs and imaging studies  CBC: Recent Labs  Lab 11/16/20 0246 11/17/20 0815 11/18/20 0227 11/18/20 1237 11/18/20 2038 11/19/20 0543 11/20/20 0504  WBC 10.8* 9.3 10.6*  --   --  12.5* 10.7*  NEUTROABS 8.5* 6.9 8.2*  --   --  9.6* 7.9*  HGB 8.0* 8.2* 6.4* 7.3* 9.0* 8.5* 8.4*  HCT 25.2* 26.2* 20.5* 23.0* 27.4* 26.6* 26.5*  MCV 86.3 88.8 89.1  --   --  91.4 93.3  PLT 168  134* 124*  --   --  104* 627*   Basic Metabolic Panel: Recent Labs  Lab 11/16/20 0246 11/17/20 0815 11/18/20 0227 11/19/20 0543 11/20/20 0504  NA 139 139 143 141 142  K 3.9 4.1 4.4 4.6 5.0  CL 101 102 106 107 107  CO2 25 28 26 25 26   GLUCOSE 166* 175* 194* 136* 132*  BUN 99* 97* 95* 79* 81*  CREATININE 4.79* 4.24* 3.89* 3.36* 3.32*  CALCIUM 8.2* 8.5* 8.5* 8.8* 9.0  MG 2.4 2.1 2.0 2.1 2.1  PHOS 5.9* 4.9* 3.8 3.8 4.0   GFR: Estimated Creatinine Clearance: 18.9 mL/min (A) (by C-G formula based on SCr of 3.32 mg/dL (H)). Liver Function Tests: Recent Labs  Lab 11/16/20 0246 11/17/20 0815 11/18/20 0227 11/19/20 0543 11/20/20 0504  AST 19 19 19 19 20   ALT 18 18 15 16 16   ALKPHOS 59 66 54 57 58  BILITOT 0.9 0.7 0.8 0.8 0.6  PROT 5.6* 5.8* 5.0* 4.9* 5.5*  ALBUMIN 2.1* 2.3* 2.0* 2.1* 2.3*   No results for input(s): LIPASE, AMYLASE in the last 168 hours. No results for input(s): AMMONIA in the  last 168 hours. Coagulation Profile: No results for input(s): INR, PROTIME in the last 168 hours. Cardiac Enzymes: No results for input(s): CKTOTAL, CKMB, CKMBINDEX, TROPONINI in the last 168 hours. BNP (last 3 results) No results for input(s): PROBNP in the last 8760 hours. HbA1C: No results for input(s): HGBA1C in the last 72 hours. CBG: Recent Labs  Lab 11/20/20 0715 11/20/20 1105 11/20/20 1145 11/20/20 1716 11/20/20 2029  GLUCAP 151* 187* 161* 149* 171*   Lipid Profile: No results for input(s): CHOL, HDL, LDLCALC, TRIG, CHOLHDL, LDLDIRECT in the last 72 hours. Thyroid Function Tests: No results for input(s): TSH, T4TOTAL, FREET4, T3FREE, THYROIDAB in the last 72 hours. Anemia Panel: No results for input(s): VITAMINB12, FOLATE, FERRITIN, TIBC, IRON, RETICCTPCT in the last 72 hours. Sepsis Labs: No results for input(s): PROCALCITON, LATICACIDVEN in the last 168 hours.  Recent Results (from the past 240 hour(s))  Aerobic/Anaerobic Culture w Gram Stain (surgical/deep  wound)     Status: None   Collection Time: 11/11/20  3:23 PM   Specimen: Foot, Left; Abscess  Result Value Ref Range Status   Specimen Description   Final    WOUND LEFT FOOT ABSCESS Performed at [facility], [address], [place], [place] [address]    Special Requests   Final    NONE Performed at [facility], [address] [place], [place] [address]    Gram Stain   Final    NO ORGANISMS SEEN SQUAMOUS EPITHELIAL CELLS PRESENT MODERATE WBC PRESENT, PREDOMINANTLY MONONUCLEAR MODERATE GRAM POSITIVE COCCI Performed at [facility], [place] [address] [place], [place] [address]    Culture   Final    RARE STAPHYLOCOCCUS AUREUS RARE PASTEURELLA MULTOCIDA Usually susceptible to penicillin and other beta lactam agents,quinolones,macrolides and tetracyclines. MIXED ANAEROBIC FLORA PRESENT.  CALL LAB IF FURTHER IID REQUIRED.    Report Status 11/17/2020 FINAL  Final   Organism ID, Bacteria STAPHYLOCOCCUS AUREUS  Final      Susceptibility   Staphylococcus aureus - MIC*    CIPROFLOXACIN <=0.5 SENSITIVE Sensitive     ERYTHROMYCIN <=0.25 SENSITIVE Sensitive     GENTAMICIN <=0.5 SENSITIVE Sensitive     OXACILLIN <=0.25 SENSITIVE Sensitive     TETRACYCLINE <=1 SENSITIVE Sensitive     VANCOMYCIN <=0.5 SENSITIVE Sensitive     TRIMETH/SULFA <=10 SENSITIVE Sensitive     CLINDAMYCIN <=0.25 SENSITIVE Sensitive     RIFAMPIN <=0.5 SENSITIVE Sensitive     Inducible Clindamycin NEGATIVE Sensitive     * RARE STAPHYLOCOCCUS AUREUS  Aerobic/Anaerobic Culture w Gram Stain (surgical/deep wound)     Status: None   Collection Time: 11/11/20  3:32 PM   Specimen: Other Source; Tissue  Result Value Ref Range Status   Specimen Description   Final    WOUND 2ND METATARSAL Performed at [facility], [address], [place], [place] [address]    Special Requests   Final    NONE Performed at [facility], [place], [place] [address]    Gram Stain    Final    NO ORGANISMS SEEN SQUAMOUS EPITHELIAL CELLS PRESENT FEW WBC PRESENT, PREDOMINANTLY MONONUCLEAR FEW GRAM POSITIVE COCCI Performed at [facility], [place] [address] [place],  [address]    Culture   Final    RARE STREPTOCOCCUS MITIS/ORALIS RARE PASTEURELLA MULTOCIDA Usually susceptible to penicillin and other beta lactam agents,quinolones,macrolides and tetracyclines. ABUNDANT BACTEROIDES SPECIES BETA LACTAMASE POSITIVE RARE STAPHYLOCOCCUS AUREUS    Report Status 11/17/2020 FINAL  Final   Organism ID, Bacteria STREPTOCOCCUS MITIS/ORALIS  Final   Organism ID, Bacteria STAPHYLOCOCCUS AUREUS  Final      Susceptibility   Staphylococcus aureus - MIC*    CIPROFLOXACIN <=0.5 SENSITIVE Sensitive     ERYTHROMYCIN <=0.25 SENSITIVE Sensitive     GENTAMICIN <=0.5 SENSITIVE Sensitive     OXACILLIN <=0.25 SENSITIVE Sensitive     TETRACYCLINE <=1 SENSITIVE Sensitive     VANCOMYCIN <=0.5 SENSITIVE Sensitive     TRIMETH/SULFA <=10 SENSITIVE Sensitive     CLINDAMYCIN <=0.25 SENSITIVE Sensitive     RIFAMPIN <=0.5 SENSITIVE Sensitive     Inducible Clindamycin NEGATIVE Sensitive     * RARE STAPHYLOCOCCUS AUREUS   Streptococcus mitis/oralis - MIC*    TETRACYCLINE 0.5 SENSITIVE Sensitive     VANCOMYCIN 0.5 SENSITIVE Sensitive     CLINDAMYCIN <=0.25 SENSITIVE Sensitive     * RARE STREPTOCOCCUS MITIS/ORALIS  Aerobic/Anaerobic Culture w Gram Stain (surgical/deep wound)     Status: None   Collection Time: 11/11/20  4:26 PM   Specimen: Other Source; Tissue  Result Value Ref Range Status   Specimen Description   Final    WOUND LEFT METATARSAL Performed at [facility], [address] [place], [place] [address]    Special Requests   Final    NONE Performed at [facility], [place], [place] [address]    Gram Stain NO WBC SEEN NO ORGANISMS SEEN   Final   Culture   Final    RARE BACTEROIDES SPECIES BETA LACTAMASE POSITIVE RARE  PEPTOSTREPTOCOCCUS MICROS Standardized susceptibility testing for this organism is not available. Performed at [facility], [place] [address] [place], [place] [address]    Report Status 11/17/2020 FINAL  Final  MRSA Next Gen by PCR, Nasal     Status: None   Collection Time: 11/13/20 11:22 AM   Specimen: Nasal Mucosa; Nasal Swab  Result Value Ref Range Status   MRSA by PCR Next Gen NOT DETECTED NOT DETECTED Final    Comment: (NOTE) The GeneXpert MRSA Assay (FDA approved for NASAL specimens only), is one component of a comprehensive MRSA colonization surveillance program. It is not intended to diagnose MRSA infection nor to guide or monitor treatment for MRSA infections. Test performance is not FDA approved in patients less than 64 years old. Performed at [facility], [address] [place], [place] [address]   CULTURE, BLOOD (ROUTINE X 2) w Reflex to ID Panel     Status: None   Collection Time: 11/13/20 12:44 PM   Specimen: BLOOD  Result Value Ref Range Status   Specimen Description BLOOD LEFT ANTECUBITAL  Final   Special Requests   Final    BOTTLES DRAWN AEROBIC AND ANAEROBIC Blood Culture adequate volume   Culture   Final    NO GROWTH 5 DAYS Performed at [facility], [address] [place], [place] [address]    Report Status 11/18/2020 FINAL  Final  CULTURE, BLOOD (ROUTINE X 2) w Reflex to ID Panel     Status: None   Collection Time: 11/13/20 12:52 PM   Specimen: BLOOD  Result Value Ref Range Status   Specimen Description BLOOD BLOOD LEFT HAND  Final   Special Requests   Final    BOTTLES DRAWN AEROBIC ONLY Blood Culture results may not be optimal due to an inadequate volume of blood received in culture bottles   Culture   Final    NO GROWTH 5 DAYS  Performed at [facility], [address] [place], [place] [address]    Report Status 11/18/2020 FINAL  Final  Culture, Respiratory w Gram Stain     Status: None   Collection Time:  11/13/20  3:43 PM   Specimen: Tracheal Aspirate; Respiratory  Result Value Ref Range Status   Specimen Description   Final    TRACHEAL ASPIRATE Performed at [facility], [address], [place], [place] [address]    Special Requests   Final    NONE Performed at [facility], [place], [place] [address]    Gram Stain   Final    NO SQUAMOUS EPITHELIAL CELLS SEEN FEW WBC SEEN NO ORGANISMS SEEN Performed at [facility], [place] [address] [place], [place] [address]    Culture RARE CANDIDA ALBICANS  Final   Report Status 11/16/2020 FINAL  Final      Radiology Studies: CT HEAD WO CONTRAST (5MM)  Result Date: 11/19/2020 CLINICAL DATA:  Stroke follow-up.  Rule out hemorrhage. EXAM: CT HEAD WITHOUT CONTRAST TECHNIQUE: Contiguous axial images were obtained from the base of the skull through the vertex without intravenous contrast. COMPARISON:  Brain MRI dated 11/19/2020.  Head CT dated 11/13/2020. FINDINGS: Brain: Mild age-related atrophy and chronic microvascular ischemic changes. Focal area of white matter hypodensity in the left frontal convexity noted which appears more conspicuous compared to the CT of 11/13/2020. Or there is no acute intracranial hemorrhage. No mass effect midline shift no extra-axial fluid collection. Vascular: No hyperdense vessel or unexpected calcification. Skull: Normal. Negative for fracture or focal lesion. Sinuses/Orbits: No acute finding. Other: None IMPRESSION: 1. No acute intracranial hemorrhage. 2. Mild age-related atrophy and chronic microvascular ischemic changes. 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 Electronically Signed   By: Anner Crete M.D.   On: 11/19/2020 22:35   MR ANGIO HEAD WO CONTRAST  Result Date: 11/19/2020 CLINICAL DATA:  Stroke, follow up. EXAM: MRA NECK WITHOUT CONTRAST MRA HEAD WITHOUT CONTRAST TECHNIQUE: Angiographic images of the Circle of Willis were acquired using MRA  technique without intravenous contrast. COMPARISON:  None. FINDINGS: MRA NECK FINDINGS The study is degraded by motion, particularly at the skull base. Luminal irregularity along the petrous segment of the bilateral internal carotid arteries with apparent high-grade stenosis, likely artifactual. Mild luminal irregularity of the cavernous segment of the bilateral internal carotid arteries. Normal caliber and flow related enhancement of the bilateral ACA vascular trees. Attenuation of flow in the M3/MCA branches bilaterally is likely artifactual. Normal caliber and flow related enhancement of the visualized portions of the V4 segment of the bilateral vertebral arteries. The basilar artery and bilateral posterior cerebral arteries are maintained. MRA HEAD FINDINGS The study is severely degraded by motion, particularly at the level of the carotid bifurcations. Normal flow related enhancement seen in the visualized portions of the bilateral common carotid artery. Attenuation of flow at the level of the carotid bulbs and in the distal cervical segment may be artifactual. Note is made of retropharyngeal course of the bilateral common carotid arteries and proximal cervical internal carotid arteries. Normal caliber and flow related enhancement of the visualized V2 segment of the bilateral vertebral arteries. Attenuation of flow at the V3 segment is artifactual. IMPRESSION: 1. Motion degraded study significantly limiting evaluation. Consider repeat study or CT angiogram of the head and neck when clinically appropriate. 2. Luminal irregularity with high-grade stenosis in the petrous segment of the bilateral internal carotid arteries are  likely artifactual. Likewise, attenuation of flow in the bilateral M3/MCA segments are likely artifactual. However, superimposed atherosclerotic stenosis cannot be excluded. 3. 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 4. Normal caliber of the visualized portions of the  common carotid arteries and proximal carotid bifurcation with attenuation of flow in the bulbs and in the distal cervical segments, likely artifactual. Electronically Signed   By: Pedro Earls M.D.   On: 11/19/2020 15:41   MR ANGIO NECK WO CONTRAST  Result Date: 11/19/2020 CLINICAL DATA:  Stroke, follow up. EXAM: MRA NECK WITHOUT CONTRAST MRA HEAD WITHOUT CONTRAST TECHNIQUE: Angiographic images of the Circle of Willis were acquired using MRA technique without intravenous contrast. COMPARISON:  None. FINDINGS: MRA NECK FINDINGS The study is degraded by motion, particularly at the skull base. Luminal irregularity along the petrous segment of the bilateral internal carotid arteries with apparent high-grade stenosis, likely artifactual. Mild luminal irregularity of the cavernous segment of the bilateral internal carotid arteries. Normal caliber and flow related enhancement of the bilateral ACA vascular trees. Attenuation of flow in the M3/MCA branches bilaterally is likely artifactual. Normal caliber and flow related enhancement of the visualized portions of the V4 segment of the bilateral vertebral arteries. The basilar artery and bilateral posterior cerebral arteries are maintained. MRA HEAD FINDINGS The study is severely degraded by motion, particularly at the level of the carotid bifurcations. Normal flow related enhancement seen in the visualized portions of the bilateral common carotid artery. Attenuation of flow at the level of the carotid bulbs and in the distal cervical segment may be artifactual. Note is made of retropharyngeal course of the bilateral common carotid arteries and proximal cervical internal carotid arteries. Normal caliber and flow related enhancement of the visualized V2 segment of the bilateral vertebral arteries. Attenuation of flow at the V3 segment is artifactual. IMPRESSION: 1. Motion degraded study significantly limiting evaluation. Consider repeat study or CT  angiogram of the head and neck when clinically appropriate. 2. Luminal irregularity with high-grade stenosis in the petrous segment of the bilateral internal carotid arteries are likely artifactual. Likewise, attenuation of flow in the bilateral M3/MCA segments are likely artifactual. However, superimposed atherosclerotic stenosis cannot be excluded. 3. 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 4. Normal caliber of the visualized portions of the common carotid arteries and proximal carotid bifurcation with attenuation of flow in the bulbs and in the distal cervical segments, likely artifactual. Electronically Signed   By: Pedro Earls M.D.   On: 11/19/2020 15:41   US Carotid Bilateral  Result Date: 11/19/2020 CLINICAL DATA:  Stroke. EXAM: BILATERAL CAROTID DUPLEX ULTRASOUND TECHNIQUE: Pearline Cables scale imaging, color Doppler and duplex ultrasound were performed of bilateral carotid and vertebral arteries in the neck. COMPARISON:  None. FINDINGS: Examination is degraded secondary to patient's inability to tolerate appropriate positioning. Criteria: Quantification of carotid stenosis is based on velocity parameters that correlate the residual internal carotid diameter with NASCET-based stenosis levels, using the diameter of the distal internal carotid lumen as the denominator for stenosis measurement. The following velocity measurements were obtained: RIGHT ICA: 44/21 cm/sec CCA: 66/59 cm/sec SYSTOLIC ICA/CCA RATIO:  1.2 ECA: 44 cm/sec LEFT ICA: 75/21 cm/sec CCA: 93/57 cm/sec SYSTOLIC ICA/CCA RATIO:  1.8 ECA: 91 cm/sec RIGHT CAROTID ARTERY: There is a moderate to large amount of eccentric echogenic plaque within the right carotid bulb (image 18). There is a large amount of irregular mixed echogenic plaque involving the origin and proximal aspects of the right internal carotid  artery (image 25), which results in a sonographic string sign (image 26) and markedly blunted monophasic  waveform within the proximal aspect of the right internal carotid artery (image 27). RIGHT VERTEBRAL ARTERY:  Antegrade Flow LEFT CAROTID ARTERY: There is a moderate amount of intimal thickening/atherosclerotic plaque scattered throughout the left common carotid artery. There is a moderate to large amount of eccentric partially shadowing plaque within the left carotid bulb (image 52). There is a moderate amount of eccentric echogenic partially shadowing plaque involving the origin and proximal aspects of the left internal carotid artery (image 58), not definitely resulting in elevated peak systolic velocities in the left internal carotid artery, though note, velocity measurements may be unreliable in the setting of a suspected contralateral subtotal occlusion. LEFT VERTEBRAL ARTERY:  Antegrade flow IMPRESSION: 1. Large amount of right-sided atherosclerotic plaque results in a sonographic string sign and markedly blunted monophasic waveform involving the proximal aspect of the right internal carotid artery worrisome for a subtotal occlusion. Further evaluation with CTA could be performed as indicated. 2. Moderate to large amount of left-sided atherosclerotic plaque, not definitely resulting in a hemodynamically significant stenosis within the left internal carotid artery, though note, velocity measurements may be unreliable in the setting of a suspected contralateral subtotal occlusion. Again, this could be further evaluated with CTA as indicated. 3. Antegrade flow demonstrated within the bilateral vertebral arteries. These results will be called to the ordering clinician or representative by the Radiologist Assistant, and communication documented in the PACS or [employer]. Electronically Signed   By: Sandi Mariscal M.D.   On: 11/19/2020 08:36     Scheduled Meds:  sodium chloride   Intravenous Once   allopurinol  200 mg Per Tube Daily   vitamin C  500 mg Per Tube BID   atorvastatin  20 mg Oral QHS    budesonide (PULMICORT) nebulizer solution  0.5 mg Nebulization BID   Chlorhexidine Gluconate Cloth  6 each Topical Daily   diltiazem  60 mg Per Tube Q6H   docusate  100 mg Per Tube BID   feeding supplement  237 mL Oral TID BM   ferrous gluconate  324 mg Oral Q breakfast   insulin aspart  0-6 Units Subcutaneous Q4H   levothyroxine  100 mcg Per Tube Q0600   mouth rinse  15 mL Mouth Rinse BID   metoprolol tartrate  50 mg Per Tube Q6H   multivitamin with minerals  1 tablet Oral Daily   pantoprazole (PROTONIX) IV  40 mg Intravenous Daily   polyethylene glycol  17 g Per Tube Daily   sodium chloride flush  3 mL Intravenous Q12H   venlafaxine  100 mg Per Tube BID   Continuous Infusions:  sodium chloride 5 mL/hr at 11/18/20 0700   sodium chloride     ampicillin-sulbactam (UNASYN) IV 3 g (11/20/20 2135)     LOS: 11 days     Enzo Bi, MD [facility] If 7PM-7AM, please contact night-coverage 11/20/2020, 11:04 PM

## 2020-11-20 NOTE — Progress Notes (Signed)
   PODIATRY PROGRESS NOTE  NAME Autumn [name] MRN [id number] DOB Dec 17, 1950 DOA 11/09/2020   CC: Diabetic foot infection LT Chief Complaint  Patient presents with   Fall     History of present illness: 70 y.o. female patient postop day 2 s/p Incision and drainage. 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 LT. DOS: 11/11/2020.  Interval history patient has been tranisitioned to PCU, awake and talking  Past Medical History:  Diagnosis Date   Diabetes mellitus without complication ([place])    Hypertension    Kidney stone    Thyroid disease     CBC Latest Ref Rng & Units 11/20/2020 11/19/2020 11/18/2020  WBC 4.0 - 10.5 K/uL 10.7(H) 12.5(H) -  Hemoglobin 12.0 - 15.0 g/dL 8.4(L) 8.5(L) 9.0(L)  Hematocrit 36.0 - 46.0 % 26.5(L) 26.6(L) 27.4(L)  Platelets 150 - 400 K/uL 108(L) 104(L) -    BMP Latest Ref Rng & Units 11/20/2020 11/19/2020 11/18/2020  Glucose 70 - 99 mg/dL 132(H) 136(H) 194(H)  BUN 8 - 23 mg/dL 81(H) 79(H) 95(H)  Creatinine 0.44 - 1.00 mg/dL 3.32(H) 3.36(H) 3.89(H)  Sodium 135 - 145 mmol/L 142 141 143  Potassium 3.5 - 5.1 mmol/L 5.0 4.6 4.4  Chloride 98 - 111 mmol/L 107 107 106  CO2 22 - 32 mmol/L 26 25 26   Calcium 8.9 - 10.3 mg/dL 9.0 8.8(L) 8.5(L)         Physical Exam:   Sutures intact dorsal.  Exposed tendon proximally. No purulence.  No malodor.  Moderate serosanguineous drainage. No active bleeding. Minimal strikethrough since yesterday    ASSESSMENT/PLAN OF CARE 1.  S/P second ray amputation. I&D LT foot. DOS: 11/11/2020 -Dressings changed. Wound VAC ordered to assist in granulation tissue formation - Going for carotid angio Friday -Hopefully VAC will assist in granulation and we can continue local wound care / heal secondarily. May need to cover tendon with skin substitute at some point in future when more stable -Will continue to follow    Lanae Crumbly, DPM 11/20/2020     [address], [place] [address]                 Office [phone]  Fax [phone]

## 2020-11-20 NOTE — Progress Notes (Signed)
Palliative Care Progress Note, Assessment & Plan   Patient Name: Autumn [name]       Date: 11/20/2020 DOB: Jun 08, 1950  Age: 70 y.o. MRN#: [id number] Attending Physician: Enzo Bi, MD Primary Care Physician: Harlow Ohms, MD Admit Date: 11/09/2020  Reason for Consultation/Follow-up: Establishing goals of care  Subjective: Pt is sitting up in bed in NAD. She has a few drink cups in front of her. No c/o pain or distress.   HPI: 70 y.o. female  with past medical history of sepsis without shock, AKI I on CKD, rapid A. 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   Palliative medicine team was consulted to establish goals of care.  Summary of counseling/coordination of care: After reviewing the patient's chart, epic notes, labs, and imaging, I met with the patient at bedside.  She does not remember me immediately upon entry but after introducing myself she recalls my visit from yesterday.  She says she is having a better day today.  I asked her what better looks like for her.  She says her words are coming out better today.  I asked if she was concerned about her left foot.  She says that the doctors say it is getting better.  I shared that I am still concerned with the apparent osteomyelitis, or infection, and her leg.  She continues to say she is going to take it 1 day at a time.  I conveyed I was happy to see her progress but also concerned that her leg might not be progressing as well as her speeches.  I reiterated that  my role in her plan of care is to discuss advanced care planning and goals of care.  She says she wants to get better.  She is happy that she gets to go to rehab.  I asked should she have the need for a ventilator again, should her heart and lungs stop and she physically passes away, which she want to be reintubated.  She says she does not really remember this but that she does want to continue living.  She says she wants to "try".  I stated that this would mean she would remain a full code and a CODE BLUE, CPR, and all resuscitative measures would be in place.  She nodded her head.  She reached out her hand and said to put one in her ears.  She says she really appreciates my visits and hopes that I come back.  I again conveyed my concern about the infection in her leg but assured her I would continue to follow her throughout this hospitalization.  Patient will remain a full code.  Questions and concerns addressed.  Patient encouraged to call with any future concerns.  Code Status: Full code  Prognosis:  Unable to determine  Discharge Planning: [place] for rehab with Palliative care service follow-up  Care plan was discussed with patient  Physical Exam Vitals and nursing note reviewed.  Constitutional:  Appearance: Normal appearance.  HENT:     Head: Normocephalic and atraumatic.     Mouth/Throat:     Mouth: Mucous membranes are moist.  Cardiovascular:     Rate and Rhythm: Normal rate.     Pulses: Normal pulses.  Pulmonary:     Effort: Pulmonary effort is normal.  Abdominal:     Palpations: Abdomen is soft.  Musculoskeletal:     Comments: Generalized weakness  Skin:    Comments: Wound of left lower extremity  Neurological:     Mental Status: She is alert. Mental status is at baseline.     Comments: dysarthria  Psychiatric:        Mood and Affect: Mood normal.        Behavior: Behavior normal.        Thought Content: Thought content normal.         Judgment: Judgment normal.               Total Time 15 minutes Prolonged Time Billed  no   Greater than 50%  of this time was spent counseling and coordinating care related to the above assessment and plan.  Thank you for allowing the Palliative Medicine Team to assist in the care of this patient.  Zenda Ilsa Iha, FNP-BC Palliative Medicine Team Team Phone # [phone]

## 2020-11-20 NOTE — Progress Notes (Signed)
Progress Note  Patient Name: Autumn [name] Date of Encounter: 11/20/2020  [facility] Cardiologist: Dr. Saunders Revel  Subjective   No chest pain or dyspnea. She remains in Afib, with improved ventricular response. She remains off heparin gtt given drop of hemoglobin to 6.2, now 8.4 s/p 2 units of pRBC.    Inpatient Medications    Scheduled Meds:  sodium chloride   Intravenous Once   allopurinol  200 mg Per Tube Daily   vitamin C  500 mg Per Tube BID   budesonide (PULMICORT) nebulizer solution  0.5 mg Nebulization BID   Chlorhexidine Gluconate Cloth  6 each Topical Daily   diltiazem  60 mg Per Tube Q6H   docusate  100 mg Per Tube BID   ferrous gluconate  324 mg Oral Q breakfast   insulin aspart  0-6 Units Subcutaneous Q4H   ipratropium-albuterol  3 mL Nebulization TID   levothyroxine  100 mcg Per Tube Q0600   mouth rinse  15 mL Mouth Rinse BID   metoprolol tartrate  50 mg Per Tube Q6H   nutrition supplement (JUVEN)  1 packet Per Tube BID BM   pantoprazole (PROTONIX) IV  40 mg Intravenous Daily   polyethylene glycol  17 g Per Tube Daily   sodium chloride flush  3 mL Intravenous Q12H   venlafaxine  100 mg Per Tube BID   Continuous Infusions:  sodium chloride 5 mL/hr at 11/18/20 0700   sodium chloride     ampicillin-sulbactam (UNASYN) IV 3 g (11/19/20 2257)   feeding supplement (VITAL 1.5 CAL) 1,000 mL (11/18/20 0531)   lactated ringers 75 mL/hr at 11/20/20 0443   PRN Meds: sodium chloride, sodium chloride, [DISCONTINUED] acetaminophen **OR** acetaminophen, acetaminophen, albuterol, diltiazem, meclizine, ondansetron **OR** ondansetron (ZOFRAN) IV, sodium chloride flush   Vital Signs    Vitals:   11/19/20 1918 11/20/20 0011 11/20/20 0338 11/20/20 0712  BP: (!) 140/94 (!) 142/96 (!) 155/99 (!) 137/93  Pulse: 98 91 95 67  Resp: 16 16 17 18   Temp: 97.8 F (36.6 C) (!) 97.5 F (36.4 C) 97.8 F (36.6 C) 98.3 F (36.8 C)  TempSrc:   Oral Oral  SpO2: 98% 96% 97% 97%   Weight:   95.8 kg   Height:        Intake/Output Summary (Last 24 hours) at 11/20/2020 0754 Last data filed at 11/20/2020 0740 Gross per 24 hour  Intake 2926.26 ml  Output 1550 ml  Net 1376.26 ml    Last 3 Weights 11/20/2020 11/19/2020 11/17/2020  Weight (lbs) 211 lb 3.2 oz 210 lb 12.2 oz 202 lb 6.1 oz  Weight (kg) 95.8 kg 95.6 kg 91.8 kg      Telemetry    Atrial fibrillation with ventricular rates in the 80s to low 100s bpm- Personally Reviewed  ECG    No new tracings - Personally Reviewed  Physical Exam   GEN: Somnolent, soft-spoken Neck: No JVD, NG tube noted Cardiac: Irregular irregular Respiratory: Diminished breath sounds at bases with poor inspiratory effort GI: Soft, nontender, non-distended  MS: No edema; left foot in dressing Neuro: Unable to assess, generalized weakness, worse on right arm. Psych: Unable to assess  Labs    High Sensitivity Troponin:   Recent Labs  Lab 11/09/20 1220 11/09/20 1818 11/10/20 0115 11/10/20 0649  TROPONINIHS 147* 151* 162* 149*      Chemistry Recent Labs  Lab 11/18/20 0227 11/19/20 0543 11/20/20 0504  NA 143 141 142  K 4.4 4.6 5.0  CL 106 107 107  CO2 26 25 26   GLUCOSE 194* 136* 132*  BUN 95* 79* 81*  CREATININE 3.89* 3.36* 3.32*  CALCIUM 8.5* 8.8* 9.0  MG 2.0 2.1 2.1  PROT 5.0* 4.9* 5.5*  ALBUMIN 2.0* 2.1* 2.3*  AST 19 19 20   ALT 15 16 16   ALKPHOS 54 57 58  BILITOT 0.8 0.8 0.6  GFRNONAA 12* 14* 15*  ANIONGAP 11 9 9      Lipids  Recent Labs  Lab 11/14/20 0526  TRIG 213*     Hematology Recent Labs  Lab 11/18/20 0227 11/18/20 1237 11/18/20 2038 11/19/20 0543 11/20/20 0504  WBC 10.6*  --   --  12.5* 10.7*  RBC 2.30*  --   --  2.91* 2.84*  HGB 6.4*   < > 9.0* 8.5* 8.4*  HCT 20.5*   < > 27.4* 26.6* 26.5*  MCV 89.1  --   --  91.4 93.3  MCH 27.8  --   --  29.2 29.6  MCHC 31.2  --   --  32.0 31.7  RDW 16.0*  --   --  15.9* 16.4*  PLT 124*  --   --  104* 108*   < > = values in this interval not  displayed.    Thyroid  No results for input(s): TSH, FREET4 in the last 168 hours.   BNPNo results for input(s): BNP, PROBNP in the last 168 hours.  DDimer No results for input(s): DDIMER in the last 168 hours.   Radiology    US Carotid Bilateral  Result Date: 11/19/2020 IMPRESSION: 1. Large amount of right-sided atherosclerotic plaque results in a sonographic string sign and markedly blunted monophasic waveform involving the proximal aspect of the right internal carotid artery worrisome for a subtotal occlusion. Further evaluation with CTA could be performed as indicated. 2. Moderate to large amount of left-sided atherosclerotic plaque, not definitely resulting in a hemodynamically significant stenosis within the left internal carotid artery, though note, velocity measurements may be unreliable in the setting of a suspected contralateral subtotal occlusion. Again, this could be further evaluated with CTA as indicated. 3. Antegrade flow demonstrated within the bilateral vertebral arteries. These results will be called to the ordering clinician or representative by the Radiologist Assistant, and communication documented in the PACS or [employer]. Electronically Signed   By: Sandi Mariscal M.D.   On: 11/19/2020 08:36   DG Abd Portable 1V  Result Date: 11/17/2020 IMPRESSION: Weighted feeding catheter in the distal stomach. Electronically Signed   By: Inez Catalina M.D.   On: 11/17/2020 19:21    Cardiac Studies   Echo 10/2020 1. Left ventricular ejection fraction, by estimation, is 55 to 60%. The  left ventricle has normal function. The left ventricle has no regional  wall motion abnormalities. There is mild concentric left ventricular  hypertrophy. Left ventricular diastolic  parameters are indeterminate. Elevated left ventricular end-diastolic  pressure.   2. Right ventricular systolic function is normal. The right ventricular  size is normal. There is normal pulmonary artery systolic  pressure.   3. Left atrial size was severely dilated.   4. The mitral valve is normal in structure. Trivial mitral valve  regurgitation. No evidence of mitral stenosis.   5. The aortic valve is tricuspid. Aortic valve regurgitation is not  visualized. No aortic stenosis is present.   6. The inferior vena cava is normal in size with greater than 50%  respiratory variability, suggesting right atrial pressure of 3 mmHg.  7. There is a small patent foramen ovale.   Patient Profile     70 y.o. female with history of HFpEF, CKD 4, cirrhosis, smoker x50+ years, admitted with left foot osteomyelitis, being seen for atrial fibrillation with rapid ventricular response.  Hospital course complicated by respiratory failure and acute stroke.  Assessment & Plan   1.  A. fib RVR -Ventricular rates are improved on titrated dose of metoprolol 60 mg q 6 hours, which will be continued -Continue diltiazem at current dose  -Echo with preserved EF -Not a good candidate for digoxin given renal dysfunction and would like to avoid IV amiodarone for rate control given she has been been anticoagulated without interruption  -Continue to hold heparin drip for now given active bleeding from the left foot wound with significant drop in hemoglobin that required 2 units of pRBC -Recommend resumption of heparin as soon as safely possible from a stroke and blood loss perspective -Ultimately, the patient will require oral anticoagulation but options might be limited to warfarin given advanced chronic kidney disease as well as underlying liver disease  2.  Acute stroke: -Possibly embolic due to A. Fib -Heparin drip is currently on hold due to active bleeding -Carotid artery disease work up per vascular surgery    3.  Left foot osteomyelitis: -Continuous bleeding noted with a drop in hemoglobin -Antibiotics as per primary team -Possible debridement planned next week by podiatry.       Signed, Christell Faith, PA-C   11/20/2020, 7:54 AM

## 2020-11-20 NOTE — Consult Note (Signed)
[facility] Vascular Consult Note  MRN : [id number]  Autumn [name] is a 70 y.o. 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  Her family had called EMS because she has had multiple falls this week and has hit her head on the ground twice without any loss of consciousness. She also has a large bruise over her right breast related to recent fall. Patient states that she woke up and found herself on the floor and does not remember how she got there.  She is not sure if she fell.  In the ER she was found to be in rapid A. fib and does not have a known history of atrial fibrillation. She complains of lower back pain which is chronic.  She denies feeling dizzy or lightheaded prior to her falls and states that her legs gave way.  She denies having any chest pain, no shortness of breath, no fever, no chills, no dizziness, no lightheadedness, no abdominal pain, no changes in her bowel habits, no nausea, no vomiting, no urinary symptoms, no headache, no blurred vision, no focal deficit, no urinary symptoms.  On 11/12/20, the patient underwent:             1) amputation ray second metatarsal and toe             2) incision and drainage deep abscess multiple fascial planes             3) bone biopsy open deep third metatarsal  Rapid Response (11/13/20) for altered mental status. STAT CT of her head was negative. On arrival to the ICU, she required emergent intubation for respiratory distress as well as for airway protection.   MRI brain revealed an acute to early subacute infarction in the left posterior frontal cortical and subcortical brain, possibly affecting the precentral gyrus. There was no mass effect or hemorrhage.  Carotid duplex on November 19, 2020 was notable for: 1. Large amount of right-sided atherosclerotic plaque results in a sonographic string sign and markedly blunted monophasic waveform involving the proximal aspect of the right internal carotid artery worrisome for a subtotal occlusion. Further evaluation with CTA could be performed as indicated. 2. Moderate to large amount of left-sided atherosclerotic plaque, not definitely resulting in a hemodynamically significant stenosis within the left internal carotid artery, though note, velocity measurements may be unreliable in the setting of a suspected contralateral subtotal occlusion. Again, this could be further evaluated with CTA as indicated. 3. Antegrade flow demonstrated within the bilateral vertebral arteries.  Vascular is consulted in the setting of carotid stenosis and acute CVA.  Current Facility-Administered Medications  Medication Dose Route Frequency Provider Last Rate Last Admin   0.9 %  sodium chloride infusion (Manually program via Guardrails IV Fluids)   Intravenous Once Rise Patience, Autumn [name]       0.9 %  sodium chloride infusion   Intravenous PRN Loletha Grayer, Autumn [name] 5 mL/hr at 11/18/20 0700 Infusion Verify at 11/18/20 0700   0.9 %  sodium chloride infusion  250 mL Intravenous  PRN Elodia Florence., Autumn [name]       acetaminophen (TYLENOL) suppository 650 mg  650 mg Rectal Q6H PRN Agbata, Tochukwu, Autumn [name]       acetaminophen (TYLENOL) tablet 650 mg  650 mg Per Tube Q6H PRN Benita Gutter, RPH       albuterol (PROVENTIL) (2.5 MG/3ML) 0.083% nebulizer solution 2.5 mg  2.5 mg Nebulization Q4H PRN Elodia Florence., Autumn [name]       allopurinol (ZYLOPRIM) tablet 200 mg  200 mg Per Tube Daily Benita Gutter, RPH   200 mg at 11/20/20 0919    Ampicillin-Sulbactam (UNASYN) 3 g in sodium chloride 0.9 % 100 mL IVPB  3 g Intravenous Q12H Berton Mount, RPH 200 mL/hr at 11/20/20 1054 3 g at 11/20/20 1054   ascorbic acid (VITAMIN C) tablet 500 mg  500 mg Per Tube BID Benita Gutter, RPH   500 mg at 11/20/20 5621   atorvastatin (LIPITOR) tablet 20 mg  20 mg Oral QHS Kate Sable, Autumn [name]       budesonide (PULMICORT) nebulizer solution 0.5 mg  0.5 mg Nebulization BID Loletha Grayer, Autumn [name]   0.5 mg at 11/20/20 0856   Chlorhexidine Gluconate Cloth 2 % PADS 6 each  6 each Topical Daily Flora Lipps, Autumn [name]   6 each at 11/20/20 0920   diltiazem (CARDIZEM) injection 5 mg  5 mg Intravenous Q2H PRN Lang Snow, NP   5 mg at 11/19/20 1503   diltiazem (CARDIZEM) tablet 60 mg  60 mg Per Tube Q6H Kasa, Maretta Bees, Autumn [name]   60 mg at 11/20/20 1053   docusate (COLACE) 50 MG/5ML liquid 100 mg  100 mg Per Tube BID Darel Hong D, NP   100 mg at 11/20/20 0918   feeding supplement (ENSURE ENLIVE / ENSURE PLUS) liquid 237 mL  237 mL Oral TID BM Enzo Bi, Autumn [name]       ferrous gluconate (FERGON) tablet 324 mg  324 mg Oral Q breakfast Murlean Iba, Autumn [name]   324 mg at 11/20/20 1053   insulin aspart (novoLOG) injection 0-6 Units  0-6 Units Subcutaneous Q4H Lang Snow, NP   1 Units at 11/20/20 1226   ipratropium-albuterol (DUONEB) 0.5-2.5 (3) MG/3ML nebulizer solution 3 mL  3 mL Nebulization TID Flora Lipps, Autumn [name]   3 mL at 11/20/20 0856   levothyroxine (SYNTHROID) tablet 100 mcg  100 mcg Per Tube Q0600 Benita Gutter, RPH   100 mcg at 11/20/20 0500   meclizine (ANTIVERT) tablet 12.5 mg  12.5 mg Per Tube TID PRN Benita Gutter, [facility]       MEDLINE mouth rinse  15 mL Mouth Rinse BID Flora Lipps, Autumn [name]   15 mL at 11/20/20 0923   metoprolol tartrate (LOPRESSOR) tablet 50 mg  50 mg Per Tube Q6H End, Christopher, Autumn [name]   50 mg at 11/20/20 0918   multivitamin with minerals tablet 1 tablet  1 tablet Oral Daily Enzo Bi, Autumn [name]       ondansetron [facility]) tablet 4 mg   4 mg Per Tube Q6H PRN Benita Gutter, RPH       Or   ondansetron [facility]) injection 4 mg  4 mg Intravenous Q6H PRN Benita Gutter, RPH       pantoprazole (PROTONIX) injection 40 mg  40 mg Intravenous Daily Darel Hong D, NP   40 mg at 11/20/20 3086   polyethylene glycol (MIRALAX / GLYCOLAX) packet 17 g  17 g Per Tube Daily Darel Hong  D, NP   17 g at 11/20/20 0918   sodium chloride flush (NS) 0.9 % injection 3 mL  3 mL Intravenous Q12H Elodia Florence., Autumn [name]   3 mL at 11/20/20 1324   sodium chloride flush (NS) 0.9 % injection 3 mL  3 mL Intravenous PRN Elodia Florence., Autumn [name]       venlafaxine [facility]) tablet 100 mg  100 mg Per Tube BID Benita Gutter, RPH   100 mg at 11/20/20 4010   Past Medical History:  Diagnosis Date   Diabetes mellitus without complication ([place])    Hypertension    Kidney stone    Thyroid disease    Past Surgical History:  Procedure Laterality Date   ABDOMINAL HYSTERECTOMY     AMPUTATION Left 11/11/2020   Procedure: AMPUTATION RAY - 2nd metatarsal and 3rd metatarsal head;  Surgeon: [name], DPM;  Location: [facility] ORS;  Service: Podiatry;  Laterality: Left;   URETERAL STENT PLACEMENT     Social History Social History   Tobacco Use   Smoking status: Former   Smokeless tobacco: Never  Substance Use Topics   Alcohol use: Never   Drug use: Never   Family History Family History  Problem Relation Age of Onset   Hypertension Mother   Denies family history of peripheral artery disease, venous disease or renal disease.  Allergies  Allergen Reactions   Heparin    Codeine Nausea And Vomiting   REVIEW OF SYSTEMS (Negative unless checked)  Constitutional: [] Weight loss  [] Fever  [] Chills Cardiac: [] Chest pain   [] Chest pressure   [] Palpitations   [] Shortness of breath when laying flat   [] Shortness of breath at rest   [] Shortness of breath with exertion. Vascular:  [] Pain in legs with walking   [] Pain in legs at rest   [] Pain in  legs when laying flat   [] Claudication   [] Pain in feet when walking  [] Pain in feet at rest  [] Pain in feet when laying flat   [] History of DVT   [] Phlebitis   [] Swelling in legs   [] Varicose veins   [] Non-healing ulcers Pulmonary:   [] Uses home oxygen   [] Productive cough   [] Hemoptysis   [] Wheeze  [] COPD   [] Asthma Neurologic:  [x] Dizziness  [x] Blackouts   [] Seizures   [x] History of stroke   [x] History of TIA  [] Aphasia   [] Temporary blindness   [] Dysphagia   [x] Weakness or numbness in arms   [x] Weakness or numbness in legs Musculoskeletal:  [] Arthritis   [] Joint swelling   [] Joint pain   [] Low back pain Hematologic:  [] Easy bruising  [] Easy bleeding   [] Hypercoagulable state   [] Anemic  [] Hepatitis Gastrointestinal:  [] Blood in stool   [] Vomiting blood  [] Gastroesophageal reflux/heartburn   [] Difficulty swallowing. Genitourinary:  [] Chronic kidney disease   [] Difficult urination  [] Frequent urination  [] Burning with urination   [] Blood in urine Skin:  [] Rashes   [] Ulcers   [] Wounds Psychological:  [] History of anxiety   []  History of major depression.  Physical Examination  Vitals:   11/20/20 0338 11/20/20 0712 11/20/20 0858 11/20/20 1058  BP: (!) 155/99 (!) 137/93  (!) 134/98  Pulse: 95 67  92  Resp: 17 18  17   Temp: 97.8 F (36.6 C) 98.3 F (36.8 C)  98.3 F (36.8 C)  TempSrc: Oral Oral    SpO2: 97% 97% 98% 97%  Weight: 95.8 kg     Height:       Body mass index is 34.09  kg/m. Gen:  WD/WN, NAD Head: [place]/AT, No temporalis wasting. Prominent temp pulse not noted. Ear/Nose/Throat: Hearing grossly intact, nares w/o erythema or drainage, oropharynx w/o Erythema/Exudate Eyes: Sclera non-icteric, conjunctiva clear Neck: Trachea midline.  No JVD.  Pulmonary:  Good air movement, respirations not labored, equal bilaterally.  Cardiac: RRR, normal S1, S2. Vascular:  Vessel Right Left  Radial Palpable Palpable  Ulnar Palpable Palpable   Gastrointestinal: soft, non-tender/non-distended.  No guarding/reflex.  Musculoskeletal: M/S 5/5 throughout.  Extremities without ischemic changes.  No deformity or atrophy. No edema. Neurologic: Aphasia with right-sided weakness Psychiatric: Judgment intact, Mood & affect appropriate for pt's clinical situation. Dermatologic: No rashes or ulcers noted.  No cellulitis or open wounds. Lymph : No Cervical, Axillary, or Inguinal lymphadenopathy.  CBC Lab Results  Component Value Date   WBC 10.7 (H) 11/20/2020   HGB 8.4 (L) 11/20/2020   HCT 26.5 (L) 11/20/2020   MCV 93.3 11/20/2020   PLT 108 (L) 11/20/2020   BMET    Component Value Date/Time   NA 142 11/20/2020 0504   K 5.0 11/20/2020 0504   CL 107 11/20/2020 0504   CO2 26 11/20/2020 0504   GLUCOSE 132 (H) 11/20/2020 0504   BUN 81 (H) 11/20/2020 0504   CREATININE 3.32 (H) 11/20/2020 0504   CALCIUM 9.0 11/20/2020 0504   GFRNONAA 15 (L) 11/20/2020 0504   GFRAA 21 (L) 09/02/2018 1731   Estimated Creatinine Clearance: 18.9 mL/min (A) (by C-G formula based on SCr of 3.32 mg/dL (H)).  COAG Lab Results  Component Value Date   INR 1.3 (H) 11/09/2020   Radiology DG Abd 1 View  Result Date: 11/15/2020 CLINICAL DATA:  Evaluate feeding catheter placement EXAM: ABDOMEN - 1 VIEW COMPARISON:  Film from earlier in the same day. FINDINGS: Catheter is been manipulated and now lies in the mid to distal stomach directed towards the pylorus. IMPRESSION: Gastric catheter within the mid to distal stomach directed towards the pylorus. Electronically Signed   By: Inez Catalina M.D.   On: 11/15/2020 22:23   DG Abd 1 View  Result Date: 11/15/2020 CLINICAL DATA:  Evaluate feeding catheter EXAM: ABDOMEN - 1 VIEW COMPARISON:  None. FINDINGS: Scattered large and small bowel gas is noted. Weighted feeding catheter is noted coiled in the mid stomach. No free air is seen. IMPRESSION: Feeding catheter in the mid stomach. Electronically Signed   By: Inez Catalina M.D.   On: 11/15/2020 19:37   CT HEAD WO CONTRAST  (5MM)  Result Date: 11/19/2020 CLINICAL DATA:  Stroke follow-up.  Rule out hemorrhage. EXAM: CT HEAD WITHOUT CONTRAST TECHNIQUE: Contiguous axial images were obtained from the base of the skull through the vertex without intravenous contrast. COMPARISON:  Brain MRI dated 11/19/2020.  Head CT dated 11/13/2020. FINDINGS: Brain: Mild age-related atrophy and chronic microvascular ischemic changes. Focal area of white matter hypodensity in the left frontal convexity noted which appears more conspicuous compared to the CT of 11/13/2020. Or there is no acute intracranial hemorrhage. No mass effect midline shift no extra-axial fluid collection. Vascular: No hyperdense vessel or unexpected calcification. Skull: Normal. Negative for fracture or focal lesion. Sinuses/Orbits: No acute finding. Other: None IMPRESSION: 1. No acute intracranial hemorrhage. 2. Mild age-related atrophy and chronic microvascular ischemic changes. 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 Electronically Signed   By: Anner Crete M.D.   On: 11/19/2020 22:35   CT HEAD WO CONTRAST (5MM)  Result Date: 11/13/2020 CLINICAL DATA:  Mental status changes EXAM:  CT HEAD WITHOUT CONTRAST TECHNIQUE: Contiguous axial images were obtained from the base of the skull through the vertex without intravenous contrast. COMPARISON:  11/09/2020 FINDINGS: Brain: stable atrophy pattern and chronic white matter microvascular ischemic changes throughout both cerebral hemispheres. Limited with some motion artifact. No acute intracranial hemorrhage, mass lesion, new infarction, midline shift, herniation, hydrocephalus, or extra-axial fluid collection. No focal mass effect or edema. Cisterns are patent. Cerebellar atrophy as well. Vascular: Intracranial atherosclerosis at the skull base. No hyperdense vessel. Skull: Normal. Negative for fracture or focal lesion. Sinuses/Orbits: No acute finding. Other: None. IMPRESSION: Stable atrophy and white  matter microvascular ischemic changes. No acute intracranial abnormality by noncontrast CT. Electronically Signed   By: Jerilynn Mages.  Autumn M.D.   On: 11/13/2020 09:58   CT Head Wo Contrast  Result Date: 11/09/2020 CLINICAL DATA:  70 year old female with acute altered mental status. EXAM: CT HEAD WITHOUT CONTRAST TECHNIQUE: Contiguous axial images were obtained from the base of the skull through the vertex without intravenous contrast. COMPARISON:  None. FINDINGS: Brain: No evidence of acute infarction, hemorrhage, hydrocephalus, extra-axial collection or mass lesion/mass effect. Vascular: Carotid and vertebral atherosclerotic calcifications are noted. Skull: No acute abnormality Sinuses/Orbits: No acute abnormality Other: None IMPRESSION: No evidence of acute intracranial abnormality. Electronically Signed   By: Margarette Canada M.D.   On: 11/09/2020 13:05   MR ANGIO HEAD WO CONTRAST  Result Date: 11/19/2020 CLINICAL DATA:  Stroke, follow up. EXAM: MRA NECK WITHOUT CONTRAST MRA HEAD WITHOUT CONTRAST TECHNIQUE: Angiographic images of the Circle of Willis were acquired using MRA technique without intravenous contrast. COMPARISON:  None. FINDINGS: MRA NECK FINDINGS The study is degraded by motion, particularly at the skull base. Luminal irregularity along the petrous segment of the bilateral internal carotid arteries with apparent high-grade stenosis, likely artifactual. Mild luminal irregularity of the cavernous segment of the bilateral internal carotid arteries. Normal caliber and flow related enhancement of the bilateral ACA vascular trees. Attenuation of flow in the M3/MCA branches bilaterally is likely artifactual. Normal caliber and flow related enhancement of the visualized portions of the V4 segment of the bilateral vertebral arteries. The basilar artery and bilateral posterior cerebral arteries are maintained. MRA HEAD FINDINGS The study is severely degraded by motion, particularly at the level of the carotid  bifurcations. Normal flow related enhancement seen in the visualized portions of the bilateral common carotid artery. Attenuation of flow at the level of the carotid bulbs and in the distal cervical segment may be artifactual. Note is made of retropharyngeal course of the bilateral common carotid arteries and proximal cervical internal carotid arteries. Normal caliber and flow related enhancement of the visualized V2 segment of the bilateral vertebral arteries. Attenuation of flow at the V3 segment is artifactual. IMPRESSION: 1. Motion degraded study significantly limiting evaluation. Consider repeat study or CT angiogram of the head and neck when clinically appropriate. 2. Luminal irregularity with high-grade stenosis in the petrous segment of the bilateral internal carotid arteries are likely artifactual. Likewise, attenuation of flow in the bilateral M3/MCA segments are likely artifactual. However, superimposed atherosclerotic stenosis cannot be excluded. 3. 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 4. Normal caliber of the visualized portions of the common carotid arteries and proximal carotid bifurcation with attenuation of flow in the bulbs and in the distal cervical segments, likely artifactual. Electronically Signed   By: Pedro Earls M.D.   On: 11/19/2020 15:41   MR ANGIO NECK WO CONTRAST  Result Date: 11/19/2020 CLINICAL DATA:  Stroke,  follow up. EXAM: MRA NECK WITHOUT CONTRAST MRA HEAD WITHOUT CONTRAST TECHNIQUE: Angiographic images of the Circle of Willis were acquired using MRA technique without intravenous contrast. COMPARISON:  None. FINDINGS: MRA NECK FINDINGS The study is degraded by motion, particularly at the skull base. Luminal irregularity along the petrous segment of the bilateral internal carotid arteries with apparent high-grade stenosis, likely artifactual. Mild luminal irregularity of the cavernous segment of the bilateral internal carotid  arteries. Normal caliber and flow related enhancement of the bilateral ACA vascular trees. Attenuation of flow in the M3/MCA branches bilaterally is likely artifactual. Normal caliber and flow related enhancement of the visualized portions of the V4 segment of the bilateral vertebral arteries. The basilar artery and bilateral posterior cerebral arteries are maintained. MRA HEAD FINDINGS The study is severely degraded by motion, particularly at the level of the carotid bifurcations. Normal flow related enhancement seen in the visualized portions of the bilateral common carotid artery. Attenuation of flow at the level of the carotid bulbs and in the distal cervical segment may be artifactual. Note is made of retropharyngeal course of the bilateral common carotid arteries and proximal cervical internal carotid arteries. Normal caliber and flow related enhancement of the visualized V2 segment of the bilateral vertebral arteries. Attenuation of flow at the V3 segment is artifactual. IMPRESSION: 1. Motion degraded study significantly limiting evaluation. Consider repeat study or CT angiogram of the head and neck when clinically appropriate. 2. Luminal irregularity with high-grade stenosis in the petrous segment of the bilateral internal carotid arteries are likely artifactual. Likewise, attenuation of flow in the bilateral M3/MCA segments are likely artifactual. However, superimposed atherosclerotic stenosis cannot be excluded. 3. 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 4. Normal caliber of the visualized portions of the common carotid arteries and proximal carotid bifurcation with attenuation of flow in the bulbs and in the distal cervical segments, likely artifactual. Electronically Signed   By: Pedro Earls M.D.   On: 11/19/2020 15:41   MR BRAIN WO CONTRAST  Result Date: 11/13/2020 CLINICAL DATA:  Mental status changes of unknown cause. Recent fall at home. EXAM: MRI HEAD  WITHOUT CONTRAST TECHNIQUE: Multiplanar, multiecho pulse sequences of the brain and surrounding structures were obtained without intravenous contrast. COMPARISON:  Head CT 11/13/2020 FINDINGS: Brain: Diffusion imaging shows an acute to subacute infarction affecting the left posterior frontal cortical and subcortical brain, potentially the precentral gyrus. No other acute finding. No sign of hemorrhage or mass effect associated with that. Otherwise, there chronic small-vessel ischemic changes of the pons. Cerebral hemispheres show moderate chronic small-vessel ischemic changes of the white matter. No mass lesion, hemorrhage, hydrocephalus or extra-axial collection. Few punctate foci of hemosiderin deposition noted in the pons and cerebral hemispheres. Vascular: Major vessels at the base of the brain show flow. 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 No mass effect or hemorrhage. Chronic small-vessel ischemic changes elsewhere affecting the brainstem and cerebral hemispheric white matter, some associated with chronic punctate hemosiderin deposition. Electronically Signed   By: Nelson Chimes M.D.   On: 11/13/2020 16:36   US RENAL  Result Date: 11/09/2020 CLINICAL DATA:  Acute kidney injury. EXAM: RENAL / URINARY TRACT ULTRASOUND COMPLETE COMPARISON:  None. FINDINGS: Right Kidney: Renal measurements: 11.3 x 5.2 x 4.5 cm = volume: 138 mL. Increased parenchymal echogenicity. No hydronephrosis. No visualized cystic or solid lesion. No  stone. Left Kidney: Renal measurements: 8.7 x 4.9 x 5.4 cm = volume: 120 mL. Increased parenchymal echogenicity. No hydronephrosis. There is a 1 cm cyst in the upper pole. No evidence of solid lesion or stone. Bladder: Appears normal for degree of bladder distention. Other: Incidental splenomegaly  with spleen spanning 14.3 x 12.4 x 5.6 cm, splenic volume of 517 cc. IMPRESSION: 1. Increased bilateral renal parenchymal echogenicity consistent with chronic medical renal disease. No obstructive uropathy. 2. Simple cyst in the upper left kidney. 3. Incidental splenomegaly. Electronically Signed   By: Keith Rake M.D.   On: 11/09/2020 17:12   MR FOOT LEFT WO CONTRAST  Result Date: 11/10/2020 CLINICAL DATA:  Foot pain and swelling. EXAM: MRI OF THE LEFT FOOT WITHOUT CONTRAST TECHNIQUE: Multiplanar, multisequence MR imaging of the left foot was performed. No intravenous contrast was administered. COMPARISON:  None. FINDINGS: Bones/Joint/Cartilage Soft tissue wound along the plantar aspect of the forefoot overlying the second metatarsal. Bone destruction of the second metatarsal head with severe bone marrow edema throughout the second metatarsal. Bone destruction of the second proximal phalanx. Severe soft tissue edema around the second MTP joint. Bone marrow edema in the third metatarsal head. No acute fracture. Dorsal dislocation of the second proximal phalanx. 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 Ligaments Collateral ligaments are intact.  Lisfranc ligament is intact. Muscles and Tendons Flexor, peroneal and extensor compartment tendons are intact. Generalized muscle atrophy. Soft tissue Severe soft tissue swelling around the second metatarsal neck concerning for a phlegmon/developing abscess measuring 2.1 x 1 cm circumferentially around the distal shaft. No soft tissue mass. Severe soft tissue edema of the forefoot consistent with cellulitis primarily along the dorsal aspect. IMPRESSION: 1. 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 2. Severe soft tissue swelling around the second metatarsal neck concerning for a phlegmon/developing abscess measuring 2.1 x 1 cm circumferentially around the distal  shaft. 3. Mild early osteomyelitis of the third metatarsal head. 4. Severe soft tissue edema of the forefoot consistent with cellulitis primarily along the dorsal aspect. Electronically Signed   By: Kathreen Devoid M.D.   On: 11/10/2020 09:14   MR ANKLE LEFT WO CONTRAST  Result Date: 11/13/2020 CLINICAL DATA:  Suspected osteomyelitis of the ankle. Recent diagnosis of osteomyelitis involving the second metatarsal and proximal phalanx. EXAM: MRI OF THE LEFT ANKLE WITHOUT CONTRAST TECHNIQUE: Multiplanar, multisequence MR imaging of the ankle was performed. No intravenous contrast was administered. COMPARISON:  Radiographs and MRI of 11/09/2020 FINDINGS: The patient had great difficulty remaining stationary for today's exam. Only a single diagnostic series could be obtained. Osseous structures: Interval resection of the second metatarsal on 11/11/2020, with expected regional postoperative findings. It is difficult on the single series provided to get a good sense of the base of the third metatarsal and its articulation with the lateral cuneiform, there is potentially some accentuated fluid signal and irregularity the articulation of the lateral cuneiform and the base of the third metatarsal. No discrete edema signal to suggest osteomyelitis involving the distal tibia/fibula, calcaneus, cuboid, talus, navicular, or medial cuneiform. The middle and lateral cuneiforms are somewhat irregular and difficult to fully characterize on this single series. Much of this irregularity is probably simply from arthropathy along the Lisfranc joint and postoperative findings. Ligamentous structures: Limited assessment due to the lack of images, no overt discontinuity along the lateral ligamentous complex or deltoid ligament. Musculotendinous: Extensor digitorum tenosynovitis. Common peroneus tendon  sheath tenosynovitis noted with suspected longitudinal tearing of the peroneus brevis. Distal tibialis posterior tenosynovitis and  tendinopathy. Achilles tendon intact. Plantar fascia poorly assessed. Soft tissues: Extensive subcutaneous edema circumferentially in the distal calf, mitigating somewhat in the ankle region. Dorsal subcutaneous edema in the foot. IMPRESSION: 1. Limited assessment as the patient could only tolerate a single diagnostic series to be performed. This is a small subset of the normal MRI ankle exam. Reduced diagnostic sensitivity and specificity. 2. Interval resection of the second metatarsal. Possible arthropathy between the lateral cuneiform and the base of the third metatarsal. No definite osteomyelitis in the ankle region. 3. Longitudinal tear of the peroneus brevis with peroneus tenosynovitis. 4. Distal tibialis posterior tenosynovitis and tendinopathy. There is also extensor digitorum tenosynovitis. 5. Extensive subcutaneous edema circumferentially in the distal calf. Dorsal subcutaneous edema in the foot. Electronically Signed   By: Van Clines M.D.   On: 11/13/2020 07:33   US Carotid Bilateral  Result Date: 11/19/2020 CLINICAL DATA:  Stroke. EXAM: BILATERAL CAROTID DUPLEX ULTRASOUND TECHNIQUE: Pearline Cables scale imaging, color Doppler and duplex ultrasound were performed of bilateral carotid and vertebral arteries in the neck. COMPARISON:  None. FINDINGS: Examination is degraded secondary to patient's inability to tolerate appropriate positioning. Criteria: Quantification of carotid stenosis is based on velocity parameters that correlate the residual internal carotid diameter with NASCET-based stenosis levels, using the diameter of the distal internal carotid lumen as the denominator for stenosis measurement. The following velocity measurements were obtained: RIGHT ICA: 44/21 cm/sec CCA: 09/32 cm/sec SYSTOLIC ICA/CCA RATIO:  1.2 ECA: 44 cm/sec LEFT ICA: 75/21 cm/sec CCA: 67/12 cm/sec SYSTOLIC ICA/CCA RATIO:  1.8 ECA: 91 cm/sec RIGHT CAROTID ARTERY: There is a moderate to large amount of eccentric echogenic  plaque within the right carotid bulb (image 18). There is a large amount of irregular mixed echogenic plaque involving the origin and proximal aspects of the right internal carotid artery (image 25), which results in a sonographic string sign (image 26) and markedly blunted monophasic waveform within the proximal aspect of the right internal carotid artery (image 27). RIGHT VERTEBRAL ARTERY:  Antegrade Flow LEFT CAROTID ARTERY: There is a moderate amount of intimal thickening/atherosclerotic plaque scattered throughout the left common carotid artery. There is a moderate to large amount of eccentric partially shadowing plaque within the left carotid bulb (image 52). There is a moderate amount of eccentric echogenic partially shadowing plaque involving the origin and proximal aspects of the left internal carotid artery (image 58), not definitely resulting in elevated peak systolic velocities in the left internal carotid artery, though note, velocity measurements may be unreliable in the setting of a suspected contralateral subtotal occlusion. LEFT VERTEBRAL ARTERY:  Antegrade flow IMPRESSION: 1. Large amount of right-sided atherosclerotic plaque results in a sonographic string sign and markedly blunted monophasic waveform involving the proximal aspect of the right internal carotid artery worrisome for a subtotal occlusion. Further evaluation with CTA could be performed as indicated. 2. Moderate to large amount of left-sided atherosclerotic plaque, not definitely resulting in a hemodynamically significant stenosis within the left internal carotid artery, though note, velocity measurements may be unreliable in the setting of a suspected contralateral subtotal occlusion. Again, this could be further evaluated with CTA as indicated. 3. Antegrade flow demonstrated within the bilateral vertebral arteries. These results will be called to the ordering clinician or representative by the Radiologist Assistant, and communication  documented in the PACS or [employer]. Electronically Signed   By: Sandi Mariscal M.D.   On: 11/19/2020  08:36   US Venous Img Lower Unilateral Left  Result Date: 11/09/2020 CLINICAL DATA:  pain swelling EXAM: LEFT LOWER EXTREMITY VENOUS DOPPLER ULTRASOUND TECHNIQUE: Gray-scale sonography with graded compression, as well as color Doppler and duplex ultrasound were performed to evaluate the lower extremity deep venous systems from the level of the common femoral vein and including the common femoral, femoral, profunda femoral, popliteal and calf veins including the posterior tibial, peroneal and gastrocnemius veins when visible. The superficial great saphenous vein was also interrogated. Spectral Doppler was utilized to evaluate flow at rest and with distal augmentation maneuvers in the common femoral, femoral and popliteal veins. COMPARISON:  None. FINDINGS: Contralateral Common Femoral Vein: Respiratory phasicity is normal and symmetric with the symptomatic side. No evidence of thrombus. Normal compressibility. Common Femoral Vein: No evidence of thrombus. Normal compressibility, respiratory phasicity and response to augmentation. Saphenofemoral Junction: No evidence of thrombus. Normal compressibility and flow on color Doppler imaging. Profunda Femoral Vein: No evidence of thrombus. Normal compressibility and flow on color Doppler imaging. Femoral Vein: No evidence of thrombus. Normal compressibility, respiratory phasicity and response to augmentation. Popliteal Vein: No evidence of thrombus. Normal compressibility, respiratory phasicity and response to augmentation. Calf Veins: No evidence of thrombus. Normal compressibility and flow on color Doppler imaging. Other Findings:  Calf edema. IMPRESSION: No evidence of deep venous thrombosis.  Calf edema is present. Electronically Signed   By: Albin Felling M.D.   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 EXAM: PORTABLE CHEST 1 VIEW COMPARISON:  11/14/2020 and older studies. FINDINGS: Since the prior exam, the endotracheal tube and the nasal/orogastric tube have been removed. Cardiac silhouette is normal in size. No mediastinal or hilar masses. Vascular congestion has improved. Lung base opacities have cleared. Lungs are now clear. No pneumothorax.  Possible small effusions. Skeletal structures are grossly intact. IMPRESSION: 1. Significant improvement. There is residual vascular congestion, but no persistent interstitial or airspace edema. 2. Status post removal of the endotracheal tube and nasal/orogastric tube. Electronically Signed   By: Lajean Manes M.D.   On: 11/15/2020 15:58   DG Chest Port 1 View  Result Date: 11/14/2020 CLINICAL DATA:  Acute respiratory failure EXAM: PORTABLE CHEST 1 VIEW COMPARISON:  Previous day FINDINGS: Endotracheal tube terminates approximately 1 cm above the carina. Esophageal temperature probe is present. There is enteric tube which terminates below the level of the diaphragm with the distal tip not visualized. The heart is enlarged. Stable appearance of widened vascular pedicle. Left the perihilar and interstitial opacities are present bilaterally. There is increased consolidative opacity in the right lower lung. Small right pleural effusion has increased in size. No acute osseous abnormality. IMPRESSION: 1. Support lines and tubes as described. The endotracheal tube appears to terminate approximately 1 cm above the carina. 2. Cardiomegaly with perihilar and interstitial opacities compatible with pulmonary edema. Small right pleural effusion has increased in size. 3. Evolving opacity in the right lung base is more conspicuous, and raises question for superimposed infection. Attention on follow-up films. Electronically Signed   By:  Albin Felling M.D.   On: 11/14/2020 08:03   DG Chest Port 1 View  Result Date: 11/13/2020 CLINICAL DATA:  Intubation EXAM: PORTABLE CHEST 1 VIEW COMPARISON:  Portable exam 1046 hours compared to 0907 hours FINDINGS: Tip of  endotracheal tube is at carina directed toward RIGHT mainstem bronchus, recommend withdrawal 2-3 cm. Enlargement of cardiac silhouette with vascular congestion. RIGHT lung infiltrate slightly increased from previous exam. Mild RIGHT basilar atelectasis. No pleural effusion or pneumothorax. IMPRESSION: Tip of endotracheal tube is at carina, recommend withdrawal 2-3 cm. Enlargement of cardiac silhouette with pulmonary vascular congestion and increased RIGHT lung infiltrate. Findings called to patient's nurse Sarah RN in CCU on 11/13/2020 at 1110 hours. Electronically Signed   By: Lavonia Dana M.D.   On: 11/13/2020 11:21   DG Chest Port 1 View  Result Date: 11/13/2020 CLINICAL DATA:  Respiratory difficulty EXAM: PORTABLE CHEST 1 VIEW COMPARISON:  11/12/2020 FINDINGS: Heart size within normal limits. No pulmonary vascular congestion. Airspace opacity in the right lower lung likely due to pneumonitis. Lungs otherwise clear. No pneumothorax. IMPRESSION: Right basilar airspace opacity likely due to pneumonitis. Electronically Signed   By: Miachel Roux M.D.   On: 11/13/2020 09:15   DG Chest Port 1 View  Result Date: 11/12/2020 CLINICAL DATA:  Wheezing EXAM: PORTABLE CHEST 1 VIEW COMPARISON:  Chest x-ray 11/09/2020 FINDINGS: The heart and mediastinal contours are within normal limits. Patchy right lung airspace and interstitial opacity. No pulmonary edema. No pleural effusion. No pneumothorax. No acute osseous abnormality. IMPRESSION: Patchy right lung airspace and interstitial airspace opacity that may represent infection/inflammation. Electronically Signed   By: Iven Finn M.D.   On: 11/12/2020 15:05   DG Chest Portable 1 View  Result Date: 11/09/2020 CLINICAL DATA:  Altered mental  status and fall EXAM: PORTABLE CHEST 1 VIEW COMPARISON:  07/26/2007 and other studies FINDINGS: The cardiomediastinal silhouette is unremarkable. There is no evidence of focal airspace disease, pulmonary edema, suspicious pulmonary nodule/mass, pleural effusion, or pneumothorax. No acute bony abnormalities are identified. IMPRESSION: No active disease. Electronically Signed   By: Margarette Canada M.D.   On: 11/09/2020 13:06   DG Abd Portable 1V  Result Date: 11/17/2020 CLINICAL DATA:  Check gastric catheter placement EXAM: PORTABLE ABDOMEN - 1 VIEW COMPARISON:  11/15/2020 FINDINGS: Weighted feeding catheter is again noted in the distal stomach directed towards the pool or so. No free air is seen. IMPRESSION: Weighted feeding catheter in the distal stomach. Electronically Signed   By: Inez Catalina M.D.   On: 11/17/2020 19:21   DG Abd Portable 1V  Result Date: 11/13/2020 CLINICAL DATA:  Enteric tube placement EXAM: PORTABLE ABDOMEN - 1 VIEW COMPARISON:  None. FINDINGS: Tip of enteric tube is seen in the region of body of stomach. Bowel gas pattern is nonspecific. Arterial calcifications are seen. Right margin of abdominal aorta is projecting to the right of lumbar spine. This may be due to tortuosity or aneurysmal dilation of abdominal aorta. Degenerative changes are noted with disc space narrowing, bony spurs and facet hypertrophy in the lumbar spine. IMPRESSION: Tip and side port of enteric tube are noted within the stomach. Nonspecific bowel gas pattern. Calcification in the abdominal aorta is projecting slightly to the right of lumbar spine which may be due to tortuosity or aneurysmal dilation. Electronically Signed   By: Elmer Picker M.D.   On: 11/13/2020 11:03   DG Foot Complete Left  Result Date: 11/09/2020 CLINICAL DATA:  Pain and swelling.  Ulcer at the bottom of the foot. EXAM: LEFT FOOT - COMPLETE 3+ VIEW COMPARISON:  None. FINDINGS: Soft tissue wound along the plantar aspect of the forefoot.  Cortical irregularity of the third metatarsal head concerning for osteomyelitis. Irregularity of the second metatarsal head concerning  for osteomyelitis. Mild periosteal reaction involving the second and third metatarsal shafts. Dorsal subluxation of the second and third proximal phalanx. Soft tissue emphysema in the second toe. Severe soft tissue swelling involving the foot and ankle. IMPRESSION: Cortical irregularity of the third metatarsal head concerning for osteomyelitis. Irregularity of the second metatarsal head concerning for osteomyelitis. Mild periosteal reaction involving the second and third metatarsal shafts. Soft tissue swelling involving the foot and ankle which may be reactive versus secondary to cellulitis. Electronically Signed   By: Kathreen Devoid M.D.   On: 11/09/2020 13:45   EEG adult  Result Date: 11/13/2020 Derek Jack, Autumn [name]     11/13/2020  6:59 PM Routine EEG Report Autumn [name] is a 70 y.o. female with a history of encephalopathy who is undergoing an EEG to evaluate for seizures. Report: This EEG was acquired with electrodes placed according to the International 10-20 electrode system (including Fp1, Fp2, F3, F4, C3, C4, P3, P4, O1, O2, T3, T4, T5, T6, A1, A2, Fz, Cz, Pz). The following electrodes were missing or displaced: none. The best background was 5-6 Hz. This activity was near-continuous although occasionally 1-2 seconds of intervening diffuse suppression is noted. This activity is reactive to stimulation. Drowsiness was manifested by background fragmentation; deeper stages of sleep were identified by K complexes and sleep spindles. There was no focal slowing. There were no interictal epileptiform discharges. There were no electrographic seizures identified. Photic stimulation and hyperventilation were not performed. Impression and clinical correlation: This EEG was obtained while sedated and asleep and is abnormal due to moderate diffuse slowing and occasional brief periods  of intervening suppression both indicative of cerebral dysfunction, medication effect, or both. Su Monks, Autumn [name] [facility] [phone] If 7pm- 7am, please page neurology on call as listed in [place].   ECHOCARDIOGRAM COMPLETE  Result Date: 11/10/2020    ECHOCARDIOGRAM REPORT   Patient Name:   Autumn [name] Date of Exam: 11/10/2020 Medical Rec #:  [id number]          Height:       66.0 in Accession #:    [id number]         Weight:       190.0 lb Date of Birth:  28-Feb-1950          BSA:          1.957 m Patient Age:    54 years           BP:           141/94 mmHg Patient Gender: F                  HR:           100 bpm. Exam Location:  [facility] Procedure: 2D Echo and Intracardiac Opacification Agent Indications:     Atrial Fibrillation  History:         Patient has no prior history of Echocardiogram examinations.                  Risk Factors:Hypertension, Former Smoker and Diabetes.  Sonographer:     L Thornton-Maynard Referring Phys:  [id number] AGBATA Diagnosing Phys: Autumn [name]  Sonographer Comments: Suboptimal parasternal window. IMPRESSIONS  1. Left ventricular ejection fraction, by estimation, is 55 to 60%. The left ventricle has normal function. The left ventricle has no regional wall motion abnormalities. There is mild concentric left ventricular hypertrophy. Left ventricular diastolic parameters are indeterminate. Elevated left ventricular end-diastolic pressure.  2. Right ventricular systolic function is normal. The right ventricular size is normal. There is normal pulmonary artery systolic pressure.  3. Left atrial size was severely dilated.  4. The mitral valve is normal in structure. Trivial mitral valve regurgitation. No evidence of mitral stenosis.  5. The aortic valve is tricuspid. Aortic valve regurgitation is not visualized. No aortic stenosis is present.  6. The inferior vena cava is normal in size with greater than 50% respiratory variability, suggesting right  atrial pressure of 3 mmHg.  7. There is a small patent foramen ovale. FINDINGS  Left Ventricle: Left ventricular ejection fraction, by estimation, is 55 to 60%. The left ventricle has normal function. The left ventricle has no regional wall motion abnormalities. Definity contrast agent was given IV to delineate the left ventricular  endocardial borders. The left ventricular internal cavity size was normal in size. There is mild concentric left ventricular hypertrophy. Left ventricular diastolic parameters are indeterminate. Elevated left ventricular end-diastolic pressure. Right Ventricle: The right ventricular size is normal. No increase in right ventricular wall thickness. Right ventricular systolic function is normal. There is normal pulmonary artery systolic pressure. The tricuspid regurgitant velocity is 2.55 m/s, and  with an assumed right atrial pressure of 3 mmHg, the estimated right ventricular systolic pressure is 22.9 mmHg. Left Atrium: Left atrial size was severely dilated. Right Atrium: Right atrial size was normal in size. Pericardium: There is no evidence of pericardial effusion. Mitral Valve: The mitral valve is normal in structure. Mild mitral annular calcification. Trivial mitral valve regurgitation. No evidence of mitral valve stenosis. MV peak gradient, 6.4 mmHg. The mean mitral valve gradient is 3.0 mmHg. Tricuspid Valve: The tricuspid valve is normal in structure. Tricuspid valve regurgitation is trivial. No evidence of tricuspid stenosis. Aortic Valve: The aortic valve is tricuspid. Aortic valve regurgitation is not visualized. No aortic stenosis is present. Aortic valve mean gradient measures 5.0 mmHg. Aortic valve peak gradient measures 8.1 mmHg. Aortic valve area, by VTI measures 1.42 cm. Pulmonic Valve: The pulmonic valve was normal in structure. Pulmonic valve regurgitation is not visualized. No evidence of pulmonic stenosis. Aorta: The aortic root is normal in size and structure.  Venous: The inferior vena cava is normal in size with greater than 50% respiratory variability, suggesting right atrial pressure of 3 mmHg. IAS/Shunts: No atrial level shunt detected by color flow Doppler. A small patent foramen ovale is detected.  LEFT VENTRICLE PLAX 2D LVIDd:         4.00 cm      Diastology LVIDs:         2.80 cm      LV e' medial:    5.19 cm/s LV PW:         1.10 cm      LV E/e' medial:  22.2 LV IVS:        1.40 cm      LV e' lateral:   7.62 cm/s LVOT diam:     1.80 cm      LV E/e' lateral: 15.1 LV SV:         34 LV SV Index:   17 LVOT Area:     2.54 cm  LV Volumes (MOD) LV vol d, MOD A2C: 102.0 ml LV vol d, MOD A4C: 107.0 ml LV vol s, MOD A2C: 27.7 ml LV vol s, MOD A4C: 36.5 ml LV SV MOD A2C:     74.3 ml LV SV MOD A4C:     107.0 ml LV SV  MOD BP:      70.1 ml RIGHT VENTRICLE RV S prime:     11.70 cm/s TAPSE (M-mode): 2.0 cm LEFT ATRIUM              Index LA diam:        3.70 cm  1.89 cm/m LA Vol (A2C):   137.0 ml 70.02 ml/m LA Vol (A4C):   128.0 ml 65.42 ml/m LA Biplane Vol: 134.0 ml 68.48 ml/m  AORTIC VALVE                     PULMONIC VALVE AV Area (Vmax):    1.37 cm      PV Vmax:       1.04 m/s AV Area (Vmean):   1.56 cm      PV Peak grad:  4.3 mmHg AV Area (VTI):     1.42 cm AV Vmax:           142.00 cm/s AV Vmean:          101.000 cm/s AV VTI:            0.239 m AV Peak Grad:      8.1 mmHg AV Mean Grad:      5.0 mmHg LVOT Vmax:         76.40 cm/s LVOT Vmean:        62.000 cm/s LVOT VTI:          0.133 m LVOT/AV VTI ratio: 0.56  AORTA Ao Root diam: 3.20 cm MITRAL VALVE                TRICUSPID VALVE MV Area (PHT): 2.99 cm     TR Peak grad:   26.0 mmHg MV Area VTI:   1.66 cm     TR Vmax:        255.00 cm/s MV Peak grad:  6.4 mmHg MV Mean grad:  3.0 mmHg     SHUNTS MV Vmax:       1.26 m/s     Systemic VTI:  0.13 m MV Vmean:      77.4 cm/s    Systemic Diam: 1.80 cm MV Decel Time: 254 msec MV E velocity: 115.33 cm/s Autumn [name] Electronically signed by Autumn [name]  Signature Date/Time: 11/10/2020/10:52:52 AM    Final     Assessment/Plan Autumn [name] is a 70 year old female with medical history significant for diabetes mellitus with complications of stage IV-V chronic kidney disease, history of liver cirrhosis secondary to NASH, hypertension, thyroid disease and a left foot diabetic ulcer who presents to the emergency room via EMS for evaluation after she woke up and found herself on the floor found to have carotid disease in the setting of her acute CVA  1.  Carotid Stenosis: With a newly diagnosed CVA.  Carotid duplex was notable for possible string sign on the left as well as moderate to large amount of left-sided plaque.  Follow-up MRA (due to the patient's kidney disease) contains a lot of artifact due to motion.  An attempt to assess the patient's anatomy and contributing degree of atherosclerotic disease recommend she undergo a left carotid angiogram.  We will plan on this Friday with Dr. Delana Meyer.  Procedure, risk and benefits were explained to the patient.  All questions were answered.  Patient wishes to proceed.  2.  Chronic Wounds Left Foot: S/p debridement by podiatry Patient will need further intervention however due to recent diagnosis of CVA we  will not proceed during this inpatient stay.  3.  Atrial Fibrillation: New onset. Currently on heparin gtt.  Discussed with Dr. Francene Castle, PA-C 11/20/2020 2:17 PM  This note was created with Dragon medical transcription system.  Any error is purely unintentional.

## 2020-11-21 ENCOUNTER — Other Ambulatory Visit (INDEPENDENT_AMBULATORY_CARE_PROVIDER_SITE_OTHER): Payer: Self-pay | Admitting: Vascular Surgery

## 2020-11-21 ENCOUNTER — Inpatient Hospital Stay: Payer: Medicare (Managed Care)

## 2020-11-21 DIAGNOSIS — R7989 Other specified abnormal findings of blood chemistry: Secondary | ICD-10-CM

## 2020-11-21 DIAGNOSIS — R4182 Altered mental status, unspecified: Secondary | ICD-10-CM

## 2020-11-21 DIAGNOSIS — L089 Local infection of the skin and subcutaneous tissue, unspecified: Secondary | ICD-10-CM | POA: Diagnosis not present

## 2020-11-21 DIAGNOSIS — E11628 Type 2 diabetes mellitus with other skin complications: Secondary | ICD-10-CM | POA: Diagnosis not present

## 2020-11-21 DIAGNOSIS — R0603 Acute respiratory distress: Secondary | ICD-10-CM | POA: Diagnosis not present

## 2020-11-21 DIAGNOSIS — I509 Heart failure, unspecified: Secondary | ICD-10-CM | POA: Diagnosis not present

## 2020-11-21 DIAGNOSIS — A419 Sepsis, unspecified organism: Secondary | ICD-10-CM | POA: Diagnosis not present

## 2020-11-21 DIAGNOSIS — I639 Cerebral infarction, unspecified: Secondary | ICD-10-CM | POA: Diagnosis not present

## 2020-11-21 DIAGNOSIS — I4891 Unspecified atrial fibrillation: Secondary | ICD-10-CM | POA: Diagnosis not present

## 2020-11-21 DIAGNOSIS — J9601 Acute respiratory failure with hypoxia: Secondary | ICD-10-CM | POA: Diagnosis not present

## 2020-11-21 LAB — CBC
HCT: 26.9 % — ABNORMAL LOW (ref 36.0–46.0)
Hemoglobin: 8.4 g/dL — ABNORMAL LOW (ref 12.0–15.0)
MCH: 29.6 pg (ref 26.0–34.0)
MCHC: 31.2 g/dL (ref 30.0–36.0)
MCV: 94.7 fL (ref 80.0–100.0)
Platelets: 113 10*3/uL — ABNORMAL LOW (ref 150–400)
RBC: 2.84 MIL/uL — ABNORMAL LOW (ref 3.87–5.11)
RDW: 17.2 % — ABNORMAL HIGH (ref 11.5–15.5)
WBC: 12.6 10*3/uL — ABNORMAL HIGH (ref 4.0–10.5)
nRBC: 0.2 % (ref 0.0–0.2)

## 2020-11-21 LAB — GLUCOSE, CAPILLARY
Glucose-Capillary: 148 mg/dL — ABNORMAL HIGH (ref 70–99)
Glucose-Capillary: 119 mg/dL — ABNORMAL HIGH (ref 70–99)
Glucose-Capillary: 162 mg/dL — ABNORMAL HIGH (ref 70–99)
Glucose-Capillary: 142 mg/dL — ABNORMAL HIGH (ref 70–99)
Glucose-Capillary: 196 mg/dL — ABNORMAL HIGH (ref 70–99)

## 2020-11-21 LAB — BLOOD GAS, ARTERIAL
Acid-Base Excess: 4.2 mmol/L — ABNORMAL HIGH (ref 0.0–2.0)
Bicarbonate: 29.7 mmol/L — ABNORMAL HIGH (ref 20.0–28.0)
FIO2: 0.32
O2 Saturation: 92.4 %
Patient temperature: 37
pCO2 arterial: 48 mmHg (ref 32.0–48.0)
pH, Arterial: 7.4 (ref 7.350–7.450)
pO2, Arterial: 65 mmHg — ABNORMAL LOW (ref 83.0–108.0)

## 2020-11-21 LAB — BASIC METABOLIC PANEL
Anion gap: 7 (ref 5–15)
BUN: 71 mg/dL — ABNORMAL HIGH (ref 8–23)
CO2: 25 mmol/L (ref 22–32)
Calcium: 8.6 mg/dL — ABNORMAL LOW (ref 8.9–10.3)
Chloride: 108 mmol/L (ref 98–111)
Creatinine, Ser: 3.38 mg/dL — ABNORMAL HIGH (ref 0.44–1.00)
GFR, Estimated: 14 mL/min — ABNORMAL LOW (ref >60)
Glucose, Bld: 133 mg/dL — ABNORMAL HIGH (ref 70–99)
Potassium: 4.9 mmol/L (ref 3.5–5.1)
Sodium: 140 mmol/L (ref 135–145)

## 2020-11-21 LAB — TROPONIN I (HIGH SENSITIVITY): Troponin I (High Sensitivity): 81 ng/L — ABNORMAL HIGH (ref <18)

## 2020-11-21 LAB — BRAIN NATRIURETIC PEPTIDE: B Natriuretic Peptide: 589.6 pg/mL — ABNORMAL HIGH (ref 0.0–100.0)

## 2020-11-21 LAB — MAGNESIUM: Magnesium: 1.9 mg/dL (ref 1.7–2.4)

## 2020-11-21 LAB — PROCALCITONIN: Procalcitonin: 0.1 ng/mL

## 2020-11-21 MED ORDER — FUROSEMIDE 10 MG/ML IJ SOLN
80.0000 mg | Freq: Once | INTRAMUSCULAR | Status: AC
  Administered 2020-11-21: 80 mg via INTRAVENOUS
  Filled 2020-11-21: qty 8

## 2020-11-21 MED ORDER — FUROSEMIDE 10 MG/ML IJ SOLN
40.0000 mg | Freq: Once | INTRAMUSCULAR | Status: AC
  Administered 2020-11-21: 40 mg via INTRAVENOUS
  Filled 2020-11-21: qty 4

## 2020-11-21 MED ORDER — SODIUM CHLORIDE 0.9 % IV SOLN: INTRAVENOUS | Status: DC

## 2020-11-21 MED ORDER — METOPROLOL TARTRATE 50 MG PO TABS
50.0000 mg | ORAL_TABLET | Freq: Four times a day (QID) | ORAL | Status: DC
  Administered 2020-11-21 – 2020-11-22 (×3): 50 mg
  Filled 2020-11-21 (×3): qty 1

## 2020-11-21 MED ORDER — DILTIAZEM HCL 30 MG PO TABS
60.0000 mg | ORAL_TABLET | Freq: Four times a day (QID) | ORAL | Status: DC
  Administered 2020-11-21 – 2020-11-22 (×4): 60 mg
  Filled 2020-11-21 (×5): qty 2

## 2020-11-21 MED ORDER — LEVOTHYROXINE SODIUM 100 MCG PO TABS
100.0000 ug | ORAL_TABLET | Freq: Every day | ORAL | Status: DC
  Administered 2020-11-22 – 2020-11-24 (×3): 100 ug
  Filled 2020-11-21 (×3): qty 1

## 2020-11-21 MED ORDER — HYDRALAZINE HCL 20 MG/ML IJ SOLN
5.0000 mg | Freq: Four times a day (QID) | INTRAMUSCULAR | Status: DC | PRN
  Administered 2020-11-22: 5 mg via INTRAVENOUS

## 2020-11-21 NOTE — Progress Notes (Signed)
OT Cancellation Note  Patient Details Name: Autumn [name] MRN: [id number] DOB: Feb 02, 1950   Cancelled Treatment:    Reason Eval/Treat Not Completed: Medical issues which prohibited therapy. Chart reviewed. Pt noted to have rapid response last night. Currently HR high 130s with RN at bedside. Will hold this date and continue to follow POC as appropriate.   Dessie Coma, M.S. OTR/L  11/21/20, 1:58 PM  ascom [phone]

## 2020-11-21 NOTE — Progress Notes (Signed)
   11/21/20 1202  Assess: MEWS Score  Temp 97.6 F (36.4 C)  BP (!) 162/109  Pulse Rate (!) 129  Resp 18  SpO2 100 %  O2 Device Nasal Cannula  O2 Flow Rate (L/min) 2 L/min  Assess: MEWS Score  MEWS Temp 0  MEWS Systolic 0  MEWS Pulse 2  MEWS RR 0  MEWS LOC 0  MEWS Score 2  MEWS Score Color Yellow  Assess: if the MEWS score is Yellow or Red  Were vital signs taken at a resting state? Yes  Focused Assessment No change from prior assessment  Does the patient meet 2 or more of the SIRS criteria? Yes  Does the patient have a confirmed or suspected source of infection? No  Provider and Rapid Response Notified? No  MEWS guidelines implemented *See Row Information* Yes  Treat  MEWS Interventions Administered prn meds/treatments  Pain Scale 0-10  Pain Score 0  Take Vital Signs  Increase Vital Sign Frequency  Yellow: Q 2hr X 2 then Q 4hr X 2, if remains yellow, continue Q 4hrs  Notify: Charge Nurse/RN  Name of Charge Nurse/RN Notified Ashly, CN  Date Charge Nurse/RN Notified 11/21/20  Time Charge Nurse/RN Notified 1252  Assess: SIRS CRITERIA  SIRS Temperature  0  SIRS Pulse 1  SIRS Respirations  0  SIRS WBC 0  SIRS Score Sum  1  MD aware of the changes in pt current status, staff will continue to monitor pt

## 2020-11-21 NOTE — Progress Notes (Signed)
[facility]  ROUNDING NOTE   Subjective:   Patient seen resting in bed, alert and oriented Currently eating breakfast Denies nausea, vomiting, and diarrhea Denies shortness of breath Creatinine remains at baseline.  Objective:  Vital signs in last 24 hours:  Temp:  [97.4 F (36.3 C)-98.3 F (36.8 C)] 97.4 F (36.3 C) (11/10 1439) Pulse Rate:  [97-140] 114 (11/10 1439) Resp:  [17-20] 17 (11/10 1439) BP: (138-169)/(107-125) 152/118 (11/10 1439) SpO2:  [95 %-100 %] 98 % (11/10 1439) Weight:  [97.9 kg] 97.9 kg (11/10 0454)  Weight change: 2.1 kg Filed Weights   11/19/20 0412 11/20/20 0338 11/21/20 0454  Weight: 95.6 kg 95.8 kg 97.9 kg    Intake/Output: I/O last 3 completed shifts: In: 1635.7 [P.O.:480; I.V.:1028.7; IV Piggyback:127] Out: 1200 [Urine:1200]   Intake/Output this shift:  No intake/output data recorded.  Physical Exam: General: NAD  Head: Atraumatic. Moist oral mucosal membranes  Eyes: Anicteric  Lungs:  Clear bilaterally, normal effort  Heart: Irregular rhythm  Abdomen:  Soft, nontender  Extremities: No peripheral edema.  Surgical dressing on left foot  Neurologic: Alert, able to answer simple questions  Skin: Warm        Basic Metabolic Panel: Recent Labs  Lab 11/16/20 0246 11/17/20 0815 11/18/20 0227 11/19/20 0543 11/20/20 0504 11/21/20 0510  NA 139 139 143 141 142 140  K 3.9 4.1 4.4 4.6 5.0 4.9  CL 101 102 106 107 107 108  CO2 25 28 26 25 26 25   GLUCOSE 166* 175* 194* 136* 132* 133*  BUN 99* 97* 95* 79* 81* 71*  CREATININE 4.79* 4.24* 3.89* 3.36* 3.32* 3.38*  CALCIUM 8.2* 8.5* 8.5* 8.8* 9.0 8.6*  MG 2.4 2.1 2.0 2.1 2.1 1.9  PHOS 5.9* 4.9* 3.8 3.8 4.0  --      Liver Function Tests: Recent Labs  Lab 11/16/20 0246 11/17/20 0815 11/18/20 0227 11/19/20 0543 11/20/20 0504  AST 19 19 19 19 20   ALT 18 18 15 16 16   ALKPHOS 59 66 54 57 58  BILITOT 0.9 0.7 0.8 0.8 0.6  PROT 5.6* 5.8* 5.0* 4.9* 5.5*  ALBUMIN 2.1* 2.3*  2.0* 2.1* 2.3*    No results for input(s): LIPASE, AMYLASE in the last 168 hours. No results for input(s): AMMONIA in the last 168 hours.  CBC: Recent Labs  Lab 11/16/20 0246 11/17/20 0815 11/18/20 0227 11/18/20 1237 11/18/20 2038 11/19/20 0543 11/20/20 0504 11/21/20 0510  WBC 10.8* 9.3 10.6*  --   --  12.5* 10.7* 12.6*  NEUTROABS 8.5* 6.9 8.2*  --   --  9.6* 7.9*  --   HGB 8.0* 8.2* 6.4* 7.3* 9.0* 8.5* 8.4* 8.4*  HCT 25.2* 26.2* 20.5* 23.0* 27.4* 26.6* 26.5* 26.9*  MCV 86.3 88.8 89.1  --   --  91.4 93.3 94.7  PLT 168 134* 124*  --   --  104* 108* 113*     Cardiac Enzymes: No results for input(s): CKTOTAL, CKMB, CKMBINDEX, TROPONINI in the last 168 hours.   BNP: Invalid input(s): POCBNP  CBG: Recent Labs  Lab 11/20/20 1716 11/20/20 2029 11/21/20 0250 11/21/20 0808 11/21/20 1204  GLUCAP 149* 171* 148* 119* 162*     Microbiology: Results for orders placed or performed during the hospital encounter of 11/09/20  Resp Panel by RT-PCR (Flu A&B, Covid) Nasopharyngeal Swab     Status: None   Collection Time: 11/09/20  6:25 PM   Specimen: Nasopharyngeal Swab; Nasopharyngeal(NP) swabs in vial transport medium  Result Value Ref Range  Status   SARS Coronavirus 2 by RT PCR NEGATIVE NEGATIVE Final    Comment: (NOTE) SARS-CoV-2 target nucleic acids are NOT DETECTED.  The SARS-CoV-2 RNA is generally detectable in upper respiratory specimens during the acute phase of infection. The lowest concentration of SARS-CoV-2 viral copies this assay can detect is 138 copies/mL. A negative result does not preclude SARS-Cov-2 infection and should not be used as the sole basis for treatment or other patient management decisions. A negative result may occur with  improper specimen collection/handling, submission of specimen other than nasopharyngeal swab, presence of viral mutation(s) within the areas targeted by this assay, and inadequate number of viral copies(<138 copies/mL). A  negative result must be combined with clinical observations, patient history, and epidemiological information. The expected result is Negative.  Fact Sheet for Patients:  EntrepreneurPulse.com.au  Fact Sheet for Healthcare Providers:  IncredibleEmployment.be  This test is no t yet approved or cleared by the [place] FDA and  has been authorized for detection and/or diagnosis of SARS-CoV-2 by FDA under an Emergency Use Authorization (EUA). This EUA will remain  in effect (meaning this test can be used) for the duration of the COVID-19 declaration under Section 564(b)(1) of the Act, 21 U.S.C.section 360bbb-3(b)(1), unless the authorization is terminated  or revoked sooner.       Influenza A by PCR NEGATIVE NEGATIVE Final   Influenza B by PCR NEGATIVE NEGATIVE Final    Comment: (NOTE) The Xpert Xpress SARS-CoV-2/FLU/RSV plus assay is intended as an aid in the diagnosis of influenza from Nasopharyngeal swab specimens and should not be used as a sole basis for treatment. Nasal washings and aspirates are unacceptable for Xpert Xpress SARS-CoV-2/FLU/RSV testing.  Fact Sheet for Patients: EntrepreneurPulse.com.au  Fact Sheet for Healthcare Providers: IncredibleEmployment.be  This test is not yet approved or cleared by the [place] FDA and has been authorized for detection and/or diagnosis of SARS-CoV-2 by FDA under an Emergency Use Authorization (EUA). This EUA will remain in effect (meaning this test can be used) for the duration of the COVID-19 declaration under Section 564(b)(1) of the Act, 21 U.S.C. section 360bbb-3(b)(1), unless the authorization is terminated or revoked.  Performed at [facility], [address] [place], [place] [address]   MRSA Next Gen by PCR, Nasal     Status: None   Collection Time: 11/10/20  1:14 AM   Specimen: Nasal Mucosa; Nasal Swab  Result Value Ref  Range Status   MRSA by PCR Next Gen NOT DETECTED NOT DETECTED Final    Comment: (NOTE) The GeneXpert MRSA Assay (FDA approved for NASAL specimens only), is one component of a comprehensive MRSA colonization surveillance program. It is not intended to diagnose MRSA infection nor to guide or monitor treatment for MRSA infections. Test performance is not FDA approved in patients less than 29 years old. Performed at [facility], [address] [place], [place] [address]   Aerobic/Anaerobic Culture w Gram Stain (surgical/deep wound)     Status: None   Collection Time: 11/11/20  3:23 PM   Specimen: Foot, Left; Abscess  Result Value Ref Range Status   Specimen Description   Final    WOUND LEFT FOOT ABSCESS Performed at [facility], [address] [place], [place] [address]    Special Requests   Final    NONE Performed at [facility], [address] [place], [place] [address]    Gram Stain   Final    NO ORGANISMS SEEN SQUAMOUS EPITHELIAL  CELLS PRESENT MODERATE WBC PRESENT, PREDOMINANTLY MONONUCLEAR MODERATE GRAM POSITIVE COCCI Performed at [facility], [place] [address], [place], [place] [address]    Culture   Final    RARE STAPHYLOCOCCUS AUREUS RARE PASTEURELLA MULTOCIDA Usually susceptible to penicillin and other beta lactam agents,quinolones,macrolides and tetracyclines. MIXED ANAEROBIC FLORA PRESENT.  CALL LAB IF FURTHER IID REQUIRED.    Report Status 11/17/2020 FINAL  Final   Organism ID, Bacteria STAPHYLOCOCCUS AUREUS  Final      Susceptibility   Staphylococcus aureus - MIC*    CIPROFLOXACIN <=0.5 SENSITIVE Sensitive     ERYTHROMYCIN <=0.25 SENSITIVE Sensitive     GENTAMICIN <=0.5 SENSITIVE Sensitive     OXACILLIN <=0.25 SENSITIVE Sensitive     TETRACYCLINE <=1 SENSITIVE Sensitive     VANCOMYCIN <=0.5 SENSITIVE Sensitive     TRIMETH/SULFA <=10 SENSITIVE Sensitive     CLINDAMYCIN <=0.25 SENSITIVE Sensitive     RIFAMPIN <=0.5  SENSITIVE Sensitive     Inducible Clindamycin NEGATIVE Sensitive     * RARE STAPHYLOCOCCUS AUREUS  Aerobic/Anaerobic Culture w Gram Stain (surgical/deep wound)     Status: None   Collection Time: 11/11/20  3:32 PM   Specimen: Other Source; Tissue  Result Value Ref Range Status   Specimen Description   Final    WOUND 2ND METATARSAL Performed at [facility], [address], [place], [place] [address]    Special Requests   Final    NONE Performed at [facility], [place], [place] [address]    Gram Stain   Final    NO ORGANISMS SEEN SQUAMOUS EPITHELIAL CELLS PRESENT FEW WBC PRESENT, PREDOMINANTLY MONONUCLEAR FEW GRAM POSITIVE COCCI Performed at [facility], [place] [address], [place], [place] [address]    Culture   Final    RARE STREPTOCOCCUS MITIS/ORALIS RARE PASTEURELLA MULTOCIDA Usually susceptible to penicillin and other beta lactam agents,quinolones,macrolides and tetracyclines. ABUNDANT BACTEROIDES SPECIES BETA LACTAMASE POSITIVE RARE STAPHYLOCOCCUS AUREUS    Report Status 11/17/2020 FINAL  Final   Organism ID, Bacteria STREPTOCOCCUS MITIS/ORALIS  Final   Organism ID, Bacteria STAPHYLOCOCCUS AUREUS  Final      Susceptibility   Staphylococcus aureus - MIC*    CIPROFLOXACIN <=0.5 SENSITIVE Sensitive     ERYTHROMYCIN <=0.25 SENSITIVE Sensitive     GENTAMICIN <=0.5 SENSITIVE Sensitive     OXACILLIN <=0.25 SENSITIVE Sensitive     TETRACYCLINE <=1 SENSITIVE Sensitive     VANCOMYCIN <=0.5 SENSITIVE Sensitive     TRIMETH/SULFA <=10 SENSITIVE Sensitive     CLINDAMYCIN <=0.25 SENSITIVE Sensitive     RIFAMPIN <=0.5 SENSITIVE Sensitive     Inducible Clindamycin NEGATIVE Sensitive     * RARE STAPHYLOCOCCUS AUREUS   Streptococcus mitis/oralis - MIC*    TETRACYCLINE 0.5 SENSITIVE Sensitive     VANCOMYCIN 0.5 SENSITIVE Sensitive     CLINDAMYCIN <=0.25 SENSITIVE Sensitive     * RARE STREPTOCOCCUS MITIS/ORALIS  Aerobic/Anaerobic Culture w  Gram Stain (surgical/deep wound)     Status: None   Collection Time: 11/11/20  4:26 PM   Specimen: Other Source; Tissue  Result Value Ref Range Status   Specimen Description   Final    WOUND LEFT METATARSAL Performed at [facility], [address], [place], [place] [address]    Special Requests   Final    NONE Performed at [facility], [place], [place] [address]    Gram Stain NO WBC SEEN NO ORGANISMS SEEN   Final  Culture   Final    RARE BACTEROIDES SPECIES BETA LACTAMASE POSITIVE RARE PEPTOSTREPTOCOCCUS MICROS Standardized susceptibility testing for this organism is not available. Performed at [facility], [place] [address] [place], [place] [address]    Report Status 11/17/2020 FINAL  Final  MRSA Next Gen by PCR, Nasal     Status: None   Collection Time: 11/13/20 11:22 AM   Specimen: Nasal Mucosa; Nasal Swab  Result Value Ref Range Status   MRSA by PCR Next Gen NOT DETECTED NOT DETECTED Final    Comment: (NOTE) The GeneXpert MRSA Assay (FDA approved for NASAL specimens only), is one component of a comprehensive MRSA colonization surveillance program. It is not intended to diagnose MRSA infection nor to guide or monitor treatment for MRSA infections. Test performance is not FDA approved in patients less than 69 years old. Performed at [facility], [address] [place], [place] [address]   CULTURE, BLOOD (ROUTINE X 2) w Reflex to ID Panel     Status: None   Collection Time: 11/13/20 12:44 PM   Specimen: BLOOD  Result Value Ref Range Status   Specimen Description BLOOD LEFT ANTECUBITAL  Final   Special Requests   Final    BOTTLES DRAWN AEROBIC AND ANAEROBIC Blood Culture adequate volume   Culture   Final    NO GROWTH 5 DAYS Performed at [facility], [address] [place], [place] [address]    Report Status 11/18/2020 FINAL  Final  CULTURE, BLOOD (ROUTINE X 2) w Reflex to ID Panel     Status: None    Collection Time: 11/13/20 12:52 PM   Specimen: BLOOD  Result Value Ref Range Status   Specimen Description BLOOD BLOOD LEFT HAND  Final   Special Requests   Final    BOTTLES DRAWN AEROBIC ONLY Blood Culture results may not be optimal due to an inadequate volume of blood received in culture bottles   Culture   Final    NO GROWTH 5 DAYS Performed at [facility], [address], [place], [place] [address]    Report Status 11/18/2020 FINAL  Final  Culture, Respiratory w Gram Stain     Status: None   Collection Time: 11/13/20  3:43 PM   Specimen: Tracheal Aspirate; Respiratory  Result Value Ref Range Status   Specimen Description   Final    TRACHEAL ASPIRATE Performed at [facility], [address], [place], [place] [address]    Special Requests   Final    NONE Performed at [facility], [place], [place] [address]    Gram Stain   Final    NO SQUAMOUS EPITHELIAL CELLS SEEN FEW WBC SEEN NO ORGANISMS SEEN Performed at [facility], [place] [address], [place], [place] [address]    Culture RARE CANDIDA ALBICANS  Final   Report Status 11/16/2020 FINAL  Final    Coagulation Studies: No results for input(s): LABPROT, INR in the last 72 hours.   Urinalysis: No results for input(s): COLORURINE, LABSPEC, PHURINE, GLUCOSEU, HGBUR, BILIRUBINUR, KETONESUR, PROTEINUR, UROBILINOGEN, NITRITE, LEUKOCYTESUR in the last 72 hours.  Invalid input(s): APPERANCEUR     Imaging: CT HEAD WO CONTRAST (5MM)  Result Date: 11/21/2020 CLINICAL DATA:  Altered mental status EXAM: CT HEAD WITHOUT CONTRAST TECHNIQUE: Contiguous axial images were obtained from the base of the skull through the vertex without intravenous contrast. COMPARISON:  Previous studies including the examination of 11/19/2020 FINDINGS: Brain: There are no signs  of bleeding within the cranium. There is small area of encephalomalacia in the left frontal cortex suggests recent infarct.  There is no focal mass effect. Cortical sulci are prominent. There is decreased density in periventricular and subcortical white matter. Vascular: Unremarkable. Skull: Unremarkable. Sinuses/Orbits: Unremarkable. Other: None IMPRESSION: There are no signs of bleeding within the cranium. No significant changes are noted in recent infarct in the left frontal cortex. Atrophy. Small-vessel disease. Electronically Signed   By: Elmer Picker M.D.   On: 11/21/2020 09:40   CT HEAD WO CONTRAST (5MM)  Result Date: 11/19/2020 CLINICAL DATA:  Stroke follow-up.  Rule out hemorrhage. EXAM: CT HEAD WITHOUT CONTRAST TECHNIQUE: Contiguous axial images were obtained from the base of the skull through the vertex without intravenous contrast. COMPARISON:  Brain MRI dated 11/19/2020.  Head CT dated 11/13/2020. FINDINGS: Brain: Mild age-related atrophy and chronic microvascular ischemic changes. Focal area of white matter hypodensity in the left frontal convexity noted which appears more conspicuous compared to the CT of 11/13/2020. Or there is no acute intracranial hemorrhage. No mass effect midline shift no extra-axial fluid collection. Vascular: No hyperdense vessel or unexpected calcification. Skull: Normal. Negative for fracture or focal lesion. Sinuses/Orbits: No acute finding. Other: None IMPRESSION: 1. No acute intracranial hemorrhage. 2. Mild age-related atrophy and chronic microvascular ischemic changes. 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 Electronically Signed   By: Anner Crete M.D.   On: 11/19/2020 22:35   DG Chest Port 1 View  Result Date: 11/21/2020 CLINICAL DATA:  Acute hypoxic respiratory failure EXAM: PORTABLE CHEST 1 VIEW COMPARISON:  11/15/2020 FINDINGS: Cardiomegaly with mild interstitial edema. Mild right infrahilar opacity, favoring edema, although superimposed right lower lobe pneumonia is not excluded. Possible small left pleural effusion. No pneumothorax.  IMPRESSION: Cardiomegaly with mild interstitial edema and possible small left pleural effusion. Mild right infrahilar opacity, favoring edema, superimposed right lower lobe pneumonia not excluded. Electronically Signed   By: Julian Hy M.D.   On: 11/21/2020 03:16     Medications:    sodium chloride 5 mL/hr at 11/18/20 0700   sodium chloride     ampicillin-sulbactam (UNASYN) IV 3 g (11/21/20 1407)    sodium chloride   Intravenous Once   allopurinol  200 mg Per Tube Daily   vitamin C  500 mg Per Tube BID   budesonide (PULMICORT) nebulizer solution  0.5 mg Nebulization BID   Chlorhexidine Gluconate Cloth  6 each Topical Daily   diltiazem  60 mg Per Tube Q6H   docusate  100 mg Per Tube BID   feeding supplement  237 mL Oral TID BM   ferrous gluconate  324 mg Oral Q breakfast   insulin aspart  0-6 Units Subcutaneous TID WC   levothyroxine  100 mcg Per Tube Q0600   mouth rinse  15 mL Mouth Rinse BID   metoprolol tartrate  50 mg Per Tube Q6H   multivitamin with minerals  1 tablet Oral Daily   pantoprazole (PROTONIX) IV  40 mg Intravenous Daily   polyethylene glycol  17 g Per Tube Daily   sodium chloride flush  3 mL Intravenous Q12H   venlafaxine  100 mg Per Tube BID   sodium chloride, sodium chloride, [DISCONTINUED] acetaminophen **OR** acetaminophen, acetaminophen, albuterol, diltiazem, hydrALAZINE, meclizine, ondansetron **OR** ondansetron (ZOFRAN) IV, sodium chloride flush  Assessment/ Plan:  Ms. Autumn [name] is a 70 y.o.  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  Will require outpatient follow-up at discharge.    #Diabetes type 2 With CKD including proteinuria Lisinopril held due to current illness.  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  Gauze dressing in place   #Anemia of CKD Hemoglobin stable at 8.4. Iron supplement daily   LOS: 12   11/10/20223:38 PM

## 2020-11-21 NOTE — Progress Notes (Signed)
ANTICOAGULATION CONSULT NOTE - Initial Consult  Pharmacy Consult for Eliquis  Indication: atrial fibrillation  Allergies  Allergen Reactions   Heparin    Codeine Nausea And Vomiting    Patient Measurements: Height: 5\' 6"  (167.6 cm) Weight: 95.8 kg (211 lb 3.2 oz) IBW/kg (Calculated) : 59.3 Heparin Dosing Weight:   Vital Signs: Temp: 98.1 F (36.7 C) (11/10 0020) BP: 152/124 (11/10 0020) Pulse Rate: 97 (11/10 0020)  Labs: Recent Labs    11/18/20 0227 11/18/20 1237 11/18/20 2038 11/19/20 0543 11/20/20 0504  HGB 6.4*   < > 9.0* 8.5* 8.4*  HCT 20.5*   < > 27.4* 26.6* 26.5*  PLT 124*  --   --  104* 108*  HEPARINUNFRC 0.57  --   --   --   --   CREATININE 3.89*  --   --  3.36* 3.32*   < > = values in this interval not displayed.    Estimated Creatinine Clearance: 18.9 mL/min (A) (by C-G formula based on SCr of 3.32 mg/dL (H)).   Medical History: Past Medical History:  Diagnosis Date   Diabetes mellitus without complication ([place])    Hypertension    Kidney stone    Thyroid disease     Medications:  Medications Prior to Admission  Medication Sig Dispense Refill Last Dose   acetaminophen (TYLENOL) 325 MG tablet Take 325 mg by mouth every 6 (six) hours as needed.   Past Week   albuterol (VENTOLIN HFA) 108 (90 Base) MCG/ACT inhaler Inhale 2 puffs into the lungs 4 (four) times daily as needed.   Past Month   allopurinol (ZYLOPRIM) 100 MG tablet Take 200 mg by mouth daily.   Past Week   amLODipine (NORVASC) 5 MG tablet Take 5 mg by mouth daily.   Past Week   aspirin 81 MG EC tablet Take 81 mg by mouth at bedtime.   Past Week   bumetanide (BUMEX) 2 MG tablet Take 4 mg by mouth 2 (two) times daily.   Past Week   cloNIDine (CATAPRES) 0.3 MG tablet Take 0.3 mg by mouth at bedtime.   Past Week   hydrALAZINE (APRESOLINE) 25 MG tablet Take 50 mg by mouth 3 (three) times daily.   Past Week   levothyroxine (SYNTHROID) 100 MCG tablet Take 100 mcg by mouth every morning.   Past  Week   liraglutide (VICTOZA) 18 MG/3ML SOPN Inject 1.2 mg into the skin in the morning.   Past Month   lisinopril (ZESTRIL) 20 MG tablet Take 40 mg by mouth daily.   Past Week   potassium chloride SA (KLOR-CON) 20 MEQ tablet Take 20 mEq by mouth daily.   Past Week   simvastatin (ZOCOR) 20 MG tablet Take 20 mg by mouth every evening.   Past Week   venlafaxine (EFFEXOR) 100 MG tablet Take 100 mg by mouth 2 (two) times daily.   Past Week   meclizine (ANTIVERT) 12.5 MG tablet Take 12.5 mg by mouth 3 (three) times daily as needed.   PRN at PRN   metoCLOPramide (REGLAN) 5 MG tablet Take 5 mg by mouth daily as needed.   PRN at PRN    Assessment: Pharmacy consulted to dose Eliquis in this 70 year old female admitted with Sepsis, Afib.  No prior anticoag noted.  11/9:  SrCr = 3.32 ,  TBW = 95.8 kg   Hgb = 8.4,  Hct = 26.5  Goal of Therapy:  Prevention of thromboembolism   Plan:  Will order Eliquis  5 mg PO BID to start on 11/10 @ 0030.   Autumn [name] 11/21/2020,12:37 AM

## 2020-11-21 NOTE — Progress Notes (Addendum)
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  Her post operative course was complicated by unresponsiveness and neurologic changes, she was found to have a stroke and was intubated for airway protection.  She was extubated 11/3 and transferred to [facility] service on 11/4.  Subjective: History provided by nurse at bedside who stated that patient was having an uneventful night and then she called out and when they went to the room they found her to be having trouble breathing and appeared to be wheezing and she was administered a DuoNeb and her O2 was turned up to 3 L.  She was previously on room air.  Patient noted to have an occasional wet cough.  She denies chest pain, nausea or vomiting or abdominal pain.  Objective: Vitals:   11/20/20 1058 11/20/20 1726 11/20/20 1943 11/21/20 0020  BP: (!) 134/98 (!) 162/112 (!) 138/110 (!) 152/124  Pulse: 92 (!) 104 (!) 108 97  Resp: 17 18 20 20   Temp: 98.3 F (36.8 C) (!) 97.4 F (36.3 C) 98.1 F (36.7 C) 98.1 F (36.7 C)  TempSrc:      SpO2: 97% 98% 96% 99%  Weight:      Height:        Physical Exam Vitals and nursing note reviewed.  Constitutional:      General: She is in acute distress.     Comments: Mild to moderate respiratory distress  HENT:     Head: Normocephalic and atraumatic.     Nose: Congestion present.  Cardiovascular:     Rate and Rhythm: Tachycardia present. Rhythm irregular.  Pulmonary:     Effort: Respiratory distress present.     Breath sounds: No stridor. Wheezing and rales present.     Comments: Tachypneic, speaking in 1 and 2 word  sentences. Chest:     Chest wall: No tenderness.  Abdominal:     General: There is no distension.     Palpations: Abdomen is soft.     Tenderness: There is no abdominal tenderness.  Skin:    General: Skin is warm and dry.  Neurological:     General: No focal deficit present.     Mental Status: She is alert and oriented to person, place, and time.  Psychiatric:        Mood and Affect: Mood normal.        Behavior: Behavior normal.    Assessment & Plan:   Principal Problem:   Sepsis ([place]) Active Problems:   Atrial fibrillation with RVR (HCC)   Osteomyelitis of foot, left, acute (HCC)   CKD stage 4 due to type 2 diabetes mellitus (HCC)   Liver cirrhosis secondary to NASH (nonalcoholic steatohepatitis) (HCC)   Hypertension   History of anemia due to CKD   Frequent falls   AKI (acute kidney injury) ([place])   Cellulitis and abscess of foot, except toes   Infectious tenosynovitis   Wheeze   Atrial fibrillation with rapid ventricular response (HCC)   Bilateral carotid artery stenosis   #Acute respiratory failure with hypoxia #Possible aspiration vs Insterstitial edema #Recent intubation for airway protection in  the setting of acute stroke - Patient tachypneic and requiring 3 L to maintain sats in the low to mid 90s.   - ABG on 3 L with pH 7.40, PCO2 48 and PO2 65 - CBG in the 1 teens - EKG with A. fib with rate of 122 with no acute ST-T wave changes - Chest x-ray showing interstitial edema, right pleural effusion cannot rule out underlying pneumonia - BiPAP ordered due to increased work of breathing - Continue DuoNebs - Therapeutic trial of Lasix 40 mg IV x1 dose - Continue current antibiotics for possible aspiration pneumonia  A. fib with RVR - Rate of 122 in the setting of acute respiratory failure - Currently on Eliquis - We will treat acute respiratory failure above and continue to monitor heart rate  CRITICAL CARE Performed by: Athena Masse  Total critical care  time: 45 minutes  Critical care time was exclusive of separately billable procedures and treating other patients.  Critical care was necessary to treat or prevent imminent or life-threatening deterioration.  Critical care was time spent personally by me on the following activities: development of treatment plan with patient and/or surrogate as well as nursing, discussions with consultants, evaluation of patient's response to treatment, examination of patient, obtaining history from patient or surrogate, ordering and performing treatments and interventions, ordering and review of laboratory studies, ordering and review of radiographic studies, pulse oximetry and re-evaluation of patient's condition.   Data Reviewed: I have personally reviewed following labs and imaging studies  CBC: Recent Labs  Lab 11/16/20 0246 11/17/20 0815 11/18/20 0227 11/18/20 1237 11/18/20 2038 11/19/20 0543 11/20/20 0504  WBC 10.8* 9.3 10.6*  --   --  12.5* 10.7*  NEUTROABS 8.5* 6.9 8.2*  --   --  9.6* 7.9*  HGB 8.0* 8.2* 6.4* 7.3* 9.0* 8.5* 8.4*  HCT 25.2* 26.2* 20.5* 23.0* 27.4* 26.6* 26.5*  MCV 86.3 88.8 89.1  --   --  91.4 93.3  PLT 168 134* 124*  --   --  104* 086*   Basic Metabolic Panel: Recent Labs  Lab 11/16/20 0246 11/17/20 0815 11/18/20 0227 11/19/20 0543 11/20/20 0504  NA 139 139 143 141 142  K 3.9 4.1 4.4 4.6 5.0  CL 101 102 106 107 107  CO2 25 28 26 25 26   GLUCOSE 166* 175* 194* 136* 132*  BUN 99* 97* 95* 79* 81*  CREATININE 4.79* 4.24* 3.89* 3.36* 3.32*  CALCIUM 8.2* 8.5* 8.5* 8.8* 9.0  MG 2.4 2.1 2.0 2.1 2.1  PHOS 5.9* 4.9* 3.8 3.8 4.0   GFR: Estimated Creatinine Clearance: 18.9 mL/min (A) (by C-G formula based on SCr of 3.32 mg/dL (H)). Liver Function Tests: Recent Labs  Lab 11/16/20 0246 11/17/20 0815 11/18/20 0227 11/19/20 0543 11/20/20 0504  AST 19 19 19 19 20   ALT 18 18 15 16 16   ALKPHOS 59 66 54 57 58  BILITOT 0.9 0.7 0.8 0.8 0.6  PROT 5.6* 5.8* 5.0* 4.9* 5.5*   ALBUMIN 2.1* 2.3* 2.0* 2.1* 2.3*   No results for input(s): LIPASE, AMYLASE in the last 168 hours. No results for input(s): AMMONIA in the last 168 hours. Coagulation Profile: No results for input(s): INR, PROTIME in the last 168 hours. Cardiac Enzymes: No results for input(s): CKTOTAL, CKMB, CKMBINDEX, TROPONINI in the last 168 hours. BNP (last 3 results) No results for input(s): PROBNP in the last 8760 hours. HbA1C: No results for input(s): HGBA1C in the last 72 hours. CBG: Recent Labs  Lab 11/20/20 1105  11/20/20 1145 11/20/20 1716 11/20/20 2029 11/21/20 0250  GLUCAP 187* 161* 149* 171* 148*  Arterial Blood Gas result:  pO2 65; pCO2 48; pH 7.40;     Lipid Profile: No results for input(s): CHOL, HDL, LDLCALC, TRIG, CHOLHDL, LDLDIRECT in the last 72 hours. Thyroid Function Tests: No results for input(s): TSH, T4TOTAL, FREET4, T3FREE, THYROIDAB in the last 72 hours. Anemia Panel: No results for input(s): VITAMINB12, FOLATE, FERRITIN, TIBC, IRON, RETICCTPCT in the last 72 hours. Urine analysis:    Component Value Date/Time   COLORURINE YELLOW (A) 11/10/2020 0120   APPEARANCEUR HAZY (A) 11/10/2020 0120   LABSPEC 1.013 11/10/2020 0120   PHURINE 5.0 11/10/2020 0120   GLUCOSEU NEGATIVE 11/10/2020 0120   HGBUR NEGATIVE 11/10/2020 0120   BILIRUBINUR NEGATIVE 11/10/2020 0120   KETONESUR NEGATIVE 11/10/2020 0120   PROTEINUR 100 (A) 11/10/2020 0120   NITRITE NEGATIVE 11/10/2020 0120   LEUKOCYTESUR NEGATIVE 11/10/2020 0120   Sepsis Labs: @LABRCNTIP (procalcitonin:4,lacticidven:4)  ) Recent Results (from the past 240 hour(s))  Aerobic/Anaerobic Culture w Gram Stain (surgical/deep wound)     Status: None   Collection Time: 11/11/20  3:23 PM   Specimen: Foot, Left; Abscess  Result Value Ref Range Status   Specimen Description   Final    WOUND LEFT FOOT ABSCESS Performed at [facility], [address] [place], [place] [address]    Special Requests   Final     NONE Performed at [facility], [address] [place], [place] [address]    Gram Stain   Final    NO ORGANISMS SEEN SQUAMOUS EPITHELIAL CELLS PRESENT MODERATE WBC PRESENT, PREDOMINANTLY MONONUCLEAR MODERATE GRAM POSITIVE COCCI Performed at [facility], [place] [address] [place], [place] [address]    Culture   Final    RARE STAPHYLOCOCCUS AUREUS RARE PASTEURELLA MULTOCIDA Usually susceptible to penicillin and other beta lactam agents,quinolones,macrolides and tetracyclines. MIXED ANAEROBIC FLORA PRESENT.  CALL LAB IF FURTHER IID REQUIRED.    Report Status 11/17/2020 FINAL  Final   Organism ID, Bacteria STAPHYLOCOCCUS AUREUS  Final      Susceptibility   Staphylococcus aureus - MIC*    CIPROFLOXACIN <=0.5 SENSITIVE Sensitive     ERYTHROMYCIN <=0.25 SENSITIVE Sensitive     GENTAMICIN <=0.5 SENSITIVE Sensitive     OXACILLIN <=0.25 SENSITIVE Sensitive     TETRACYCLINE <=1 SENSITIVE Sensitive     VANCOMYCIN <=0.5 SENSITIVE Sensitive     TRIMETH/SULFA <=10 SENSITIVE Sensitive     CLINDAMYCIN <=0.25 SENSITIVE Sensitive     RIFAMPIN <=0.5 SENSITIVE Sensitive     Inducible Clindamycin NEGATIVE Sensitive     * RARE STAPHYLOCOCCUS AUREUS  Aerobic/Anaerobic Culture w Gram Stain (surgical/deep wound)     Status: None   Collection Time: 11/11/20  3:32 PM   Specimen: Other Source; Tissue  Result Value Ref Range Status   Specimen Description   Final    WOUND 2ND METATARSAL Performed at [facility], [address], [place], [place] [address]    Special Requests   Final    NONE Performed at [facility], [place], [place] [address]    Gram Stain   Final    NO ORGANISMS SEEN SQUAMOUS EPITHELIAL CELLS PRESENT FEW WBC PRESENT, PREDOMINANTLY MONONUCLEAR FEW GRAM POSITIVE COCCI Performed at [facility], [place] [address], [place], [place] [address]    Culture   Final    RARE STREPTOCOCCUS MITIS/ORALIS RARE PASTEURELLA  MULTOCIDA Usually susceptible to penicillin and other beta  lactam agents,quinolones,macrolides and tetracyclines. ABUNDANT BACTEROIDES SPECIES BETA LACTAMASE POSITIVE RARE STAPHYLOCOCCUS AUREUS    Report Status 11/17/2020 FINAL  Final   Organism ID, Bacteria STREPTOCOCCUS MITIS/ORALIS  Final   Organism ID, Bacteria STAPHYLOCOCCUS AUREUS  Final      Susceptibility   Staphylococcus aureus - MIC*    CIPROFLOXACIN <=0.5 SENSITIVE Sensitive     ERYTHROMYCIN <=0.25 SENSITIVE Sensitive     GENTAMICIN <=0.5 SENSITIVE Sensitive     OXACILLIN <=0.25 SENSITIVE Sensitive     TETRACYCLINE <=1 SENSITIVE Sensitive     VANCOMYCIN <=0.5 SENSITIVE Sensitive     TRIMETH/SULFA <=10 SENSITIVE Sensitive     CLINDAMYCIN <=0.25 SENSITIVE Sensitive     RIFAMPIN <=0.5 SENSITIVE Sensitive     Inducible Clindamycin NEGATIVE Sensitive     * RARE STAPHYLOCOCCUS AUREUS   Streptococcus mitis/oralis - MIC*    TETRACYCLINE 0.5 SENSITIVE Sensitive     VANCOMYCIN 0.5 SENSITIVE Sensitive     CLINDAMYCIN <=0.25 SENSITIVE Sensitive     * RARE STREPTOCOCCUS MITIS/ORALIS  Aerobic/Anaerobic Culture w Gram Stain (surgical/deep wound)     Status: None   Collection Time: 11/11/20  4:26 PM   Specimen: Other Source; Tissue  Result Value Ref Range Status   Specimen Description   Final    WOUND LEFT METATARSAL Performed at [facility] [address], [place], [place] [address]    Special Requests   Final    NONE Performed at [facility], [place], [place] [address]    Gram Stain NO WBC SEEN NO ORGANISMS SEEN   Final   Culture   Final    RARE BACTEROIDES SPECIES BETA LACTAMASE POSITIVE RARE PEPTOSTREPTOCOCCUS MICROS Standardized susceptibility testing for this organism is not available. Performed at [facility], [place] [address], [place], [place] [address]    Report Status 11/17/2020 FINAL  Final  MRSA Next Gen by PCR, Nasal     Status: None   Collection Time: 11/13/20  11:22 AM   Specimen: Nasal Mucosa; Nasal Swab  Result Value Ref Range Status   MRSA by PCR Next Gen NOT DETECTED NOT DETECTED Final    Comment: (NOTE) The GeneXpert MRSA Assay (FDA approved for NASAL specimens only), is one component of a comprehensive MRSA colonization surveillance program. It is not intended to diagnose MRSA infection nor to guide or monitor treatment for MRSA infections. Test performance is not FDA approved in patients less than 25 years old. Performed at [facility], [address] [place], [place] [address]   CULTURE, BLOOD (ROUTINE X 2) w Reflex to ID Panel     Status: None   Collection Time: 11/13/20 12:44 PM   Specimen: BLOOD  Result Value Ref Range Status   Specimen Description BLOOD LEFT ANTECUBITAL  Final   Special Requests   Final    BOTTLES DRAWN AEROBIC AND ANAEROBIC Blood Culture adequate volume   Culture   Final    NO GROWTH 5 DAYS Performed at [facility], [address] [place], [place] [address]    Report Status 11/18/2020 FINAL  Final  CULTURE, BLOOD (ROUTINE X 2) w Reflex to ID Panel     Status: None   Collection Time: 11/13/20 12:52 PM   Specimen: BLOOD  Result Value Ref Range Status   Specimen Description BLOOD BLOOD LEFT HAND  Final   Special Requests   Final    BOTTLES DRAWN AEROBIC ONLY Blood Culture results may not be optimal due to an  inadequate volume of blood received in culture bottles   Culture   Final    NO GROWTH 5 DAYS Performed at [facility], [address] [place], [place] [address]    Report Status 11/18/2020 FINAL  Final  Culture, Respiratory w Gram Stain     Status: None   Collection Time: 11/13/20  3:43 PM   Specimen: Tracheal Aspirate; Respiratory  Result Value Ref Range Status   Specimen Description   Final    TRACHEAL ASPIRATE Performed at [facility], [address], [place], [place] [address]    Special Requests   Final    NONE Performed at [facility], [place], [place] [address]    Gram Stain   Final    NO SQUAMOUS EPITHELIAL CELLS SEEN FEW WBC SEEN NO ORGANISMS SEEN Performed at [facility], [place] [address], [place], [place] [address]    Culture RARE CANDIDA ALBICANS  Final   Report Status 11/16/2020 FINAL  Final      Radiology Studies: CT HEAD WO CONTRAST (5MM)  Result Date: 11/19/2020 CLINICAL DATA:  Stroke follow-up.  Rule out hemorrhage. EXAM: CT HEAD WITHOUT CONTRAST TECHNIQUE: Contiguous axial images were obtained from the base of the skull through the vertex without intravenous contrast. COMPARISON:  Brain MRI dated 11/19/2020.  Head CT dated 11/13/2020. FINDINGS: Brain: Mild age-related atrophy and chronic microvascular ischemic changes. Focal area of white matter hypodensity in the left frontal convexity noted which appears more conspicuous compared to the CT of 11/13/2020. Or there is no acute intracranial hemorrhage. No mass effect midline shift no extra-axial fluid collection. Vascular: No hyperdense vessel or unexpected calcification. Skull: Normal. Negative for fracture or focal lesion. Sinuses/Orbits: No acute finding. Other: None IMPRESSION: 1. No acute intracranial hemorrhage. 2. Mild age-related atrophy and chronic microvascular ischemic changes. 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 Electronically Signed   By: Anner Crete M.D.   On: 11/19/2020 22:35   MR ANGIO HEAD WO CONTRAST  Result Date: 11/19/2020 CLINICAL DATA:  Stroke, follow up. EXAM: MRA NECK WITHOUT CONTRAST MRA HEAD WITHOUT CONTRAST TECHNIQUE: Angiographic images of the Circle of Willis were acquired using MRA technique without intravenous contrast. COMPARISON:  None. FINDINGS: MRA NECK FINDINGS The study is degraded by motion, particularly at the skull base. Luminal irregularity along the petrous segment of the bilateral internal carotid arteries with apparent high-grade stenosis, likely artifactual. Mild  luminal irregularity of the cavernous segment of the bilateral internal carotid arteries. Normal caliber and flow related enhancement of the bilateral ACA vascular trees. Attenuation of flow in the M3/MCA branches bilaterally is likely artifactual. Normal caliber and flow related enhancement of the visualized portions of the V4 segment of the bilateral vertebral arteries. The basilar artery and bilateral posterior cerebral arteries are maintained. MRA HEAD FINDINGS The study is severely degraded by motion, particularly at the level of the carotid bifurcations. Normal flow related enhancement seen in the visualized portions of the bilateral common carotid artery. Attenuation of flow at the level of the carotid bulbs and in the distal cervical segment may be artifactual. Note is made of retropharyngeal course of the bilateral common carotid arteries and proximal cervical internal carotid arteries. Normal caliber and flow related enhancement of the visualized V2 segment of the bilateral vertebral arteries. Attenuation of flow at the V3 segment is artifactual. IMPRESSION: 1. Motion degraded study significantly limiting evaluation. Consider repeat study or CT angiogram of the head and  neck when clinically appropriate. 2. Luminal irregularity with high-grade stenosis in the petrous segment of the bilateral internal carotid arteries are likely artifactual. Likewise, attenuation of flow in the bilateral M3/MCA segments are likely artifactual. However, superimposed atherosclerotic stenosis cannot be excluded. 3. 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 4. Normal caliber of the visualized portions of the common carotid arteries and proximal carotid bifurcation with attenuation of flow in the bulbs and in the distal cervical segments, likely artifactual. Electronically Signed   By: Pedro Earls M.D.   On: 11/19/2020 15:41   MR ANGIO NECK WO CONTRAST  Result Date:  11/19/2020 CLINICAL DATA:  Stroke, follow up. EXAM: MRA NECK WITHOUT CONTRAST MRA HEAD WITHOUT CONTRAST TECHNIQUE: Angiographic images of the Circle of Willis were acquired using MRA technique without intravenous contrast. COMPARISON:  None. FINDINGS: MRA NECK FINDINGS The study is degraded by motion, particularly at the skull base. Luminal irregularity along the petrous segment of the bilateral internal carotid arteries with apparent high-grade stenosis, likely artifactual. Mild luminal irregularity of the cavernous segment of the bilateral internal carotid arteries. Normal caliber and flow related enhancement of the bilateral ACA vascular trees. Attenuation of flow in the M3/MCA branches bilaterally is likely artifactual. Normal caliber and flow related enhancement of the visualized portions of the V4 segment of the bilateral vertebral arteries. The basilar artery and bilateral posterior cerebral arteries are maintained. MRA HEAD FINDINGS The study is severely degraded by motion, particularly at the level of the carotid bifurcations. Normal flow related enhancement seen in the visualized portions of the bilateral common carotid artery. Attenuation of flow at the level of the carotid bulbs and in the distal cervical segment may be artifactual. Note is made of retropharyngeal course of the bilateral common carotid arteries and proximal cervical internal carotid arteries. Normal caliber and flow related enhancement of the visualized V2 segment of the bilateral vertebral arteries. Attenuation of flow at the V3 segment is artifactual. IMPRESSION: 1. Motion degraded study significantly limiting evaluation. Consider repeat study or CT angiogram of the head and neck when clinically appropriate. 2. Luminal irregularity with high-grade stenosis in the petrous segment of the bilateral internal carotid arteries are likely artifactual. Likewise, attenuation of flow in the bilateral M3/MCA segments are likely artifactual.  However, superimposed atherosclerotic stenosis cannot be excluded. 3. 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 4. Normal caliber of the visualized portions of the common carotid arteries and proximal carotid bifurcation with attenuation of flow in the bulbs and in the distal cervical segments, likely artifactual. Electronically Signed   By: Pedro Earls M.D.   On: 11/19/2020 15:41   US Carotid Bilateral  Result Date: 11/19/2020 CLINICAL DATA:  Stroke. EXAM: BILATERAL CAROTID DUPLEX ULTRASOUND TECHNIQUE: Pearline Cables scale imaging, color Doppler and duplex ultrasound were performed of bilateral carotid and vertebral arteries in the neck. COMPARISON:  None. FINDINGS: Examination is degraded secondary to patient's inability to tolerate appropriate positioning. Criteria: Quantification of carotid stenosis is based on velocity parameters that correlate the residual internal carotid diameter with NASCET-based stenosis levels, using the diameter of the distal internal carotid lumen as the denominator for stenosis measurement. The following velocity measurements were obtained: RIGHT ICA: 44/21 cm/sec CCA: 26/71 cm/sec SYSTOLIC ICA/CCA RATIO:  1.2 ECA: 44 cm/sec LEFT ICA: 75/21 cm/sec CCA: 24/58 cm/sec SYSTOLIC ICA/CCA RATIO:  1.8 ECA: 91 cm/sec RIGHT CAROTID ARTERY: There is a moderate to large amount of eccentric echogenic plaque within the right carotid bulb (image 18).  There is a large amount of irregular mixed echogenic plaque involving the origin and proximal aspects of the right internal carotid artery (image 25), which results in a sonographic string sign (image 26) and markedly blunted monophasic waveform within the proximal aspect of the right internal carotid artery (image 27). RIGHT VERTEBRAL ARTERY:  Antegrade Flow LEFT CAROTID ARTERY: There is a moderate amount of intimal thickening/atherosclerotic plaque scattered throughout the left common carotid artery. There is a  moderate to large amount of eccentric partially shadowing plaque within the left carotid bulb (image 52). There is a moderate amount of eccentric echogenic partially shadowing plaque involving the origin and proximal aspects of the left internal carotid artery (image 58), not definitely resulting in elevated peak systolic velocities in the left internal carotid artery, though note, velocity measurements may be unreliable in the setting of a suspected contralateral subtotal occlusion. LEFT VERTEBRAL ARTERY:  Antegrade flow IMPRESSION: 1. Large amount of right-sided atherosclerotic plaque results in a sonographic string sign and markedly blunted monophasic waveform involving the proximal aspect of the right internal carotid artery worrisome for a subtotal occlusion. Further evaluation with CTA could be performed as indicated. 2. Moderate to large amount of left-sided atherosclerotic plaque, not definitely resulting in a hemodynamically significant stenosis within the left internal carotid artery, though note, velocity measurements may be unreliable in the setting of a suspected contralateral subtotal occlusion. Again, this could be further evaluated with CTA as indicated. 3. Antegrade flow demonstrated within the bilateral vertebral arteries. These results will be called to the ordering clinician or representative by the Radiologist Assistant, and communication documented in the PACS or [employer]. Electronically Signed   By: Sandi Mariscal M.D.   On: 11/19/2020 08:36   DG Chest Port 1 View  Result Date: 11/21/2020 CLINICAL DATA:  Acute hypoxic respiratory failure EXAM: PORTABLE CHEST 1 VIEW COMPARISON:  11/15/2020 FINDINGS: Cardiomegaly with mild interstitial edema. Mild right infrahilar opacity, favoring edema, although superimposed right lower lobe pneumonia is not excluded. Possible small left pleural effusion. No pneumothorax. IMPRESSION: Cardiomegaly with mild interstitial edema and possible small left  pleural effusion. Mild right infrahilar opacity, favoring edema, superimposed right lower lobe pneumonia not excluded. Electronically Signed   By: Julian Hy M.D.   On: 11/21/2020 03:16    Scheduled Meds:  sodium chloride   Intravenous Once   allopurinol  200 mg Per Tube Daily   apixaban  5 mg Oral BID   vitamin C  500 mg Per Tube BID   atorvastatin  20 mg Oral QHS   budesonide (PULMICORT) nebulizer solution  0.5 mg Nebulization BID   Chlorhexidine Gluconate Cloth  6 each Topical Daily   diltiazem  60 mg Per Tube Q6H   docusate  100 mg Per Tube BID   feeding supplement  237 mL Oral TID BM   ferrous gluconate  324 mg Oral Q breakfast   insulin aspart  0-6 Units Subcutaneous TID WC   levothyroxine  100 mcg Per Tube Q0600   mouth rinse  15 mL Mouth Rinse BID   metoprolol tartrate  50 mg Per Tube Q6H   multivitamin with minerals  1 tablet Oral Daily   pantoprazole (PROTONIX) IV  40 mg Intravenous Daily   polyethylene glycol  17 g Per Tube Daily   sodium chloride flush  3 mL Intravenous Q12H   venlafaxine  100 mg Per Tube BID   Continuous Infusions:  sodium chloride 5 mL/hr at 11/18/20 0700   sodium chloride  ampicillin-sulbactam (UNASYN) IV 3 g (11/20/20 2135)     LOS: 12 days    Time spent: [place], MD [facility]  11/21/2020, 3:07 AM

## 2020-11-21 NOTE — Progress Notes (Signed)
Date of Admission:  11/09/2020   T   ID: Autumn [name] is a 70 y.o. female  Principal Problem:   Sepsis ([place]) Active Problems:   Atrial fibrillation with RVR ([place])   Osteomyelitis of foot, left, acute ([place])   CKD stage 4 due to type 2 diabetes mellitus ([place])   Liver cirrhosis secondary to NASH (nonalcoholic steatohepatitis) ([place])   Hypertension   History of anemia due to CKD   Frequent falls   AKI (acute kidney injury) ([place])   Cellulitis and abscess of foot, except toes   Infectious tenosynovitis   [place]   Atrial fibrillation with rapid ventricular response (H[facility])   Bilateral carotid artery stenosis    Subjective: Pt awake  Change in her condition for the past 24 hrs- SOB, on oxygen CXR pulm edema    Medications:   sodium chloride   Intravenous Once   allopurinol  200 mg Per Tube Daily   vitamin C  500 mg Per Tube BID   budesonide (PULMICORT) nebulizer solution  0.5 mg Nebulization BID   Chlorhexidine Gluconate Cloth  6 each Topical Daily   diltiazem  60 mg Per Tube Q6H   docusate  100 mg Per Tube BID   feeding supplement  237 mL Oral TID BM   ferrous gluconate  324 mg Oral Q breakfast   insulin aspart  0-6 Units Subcutaneous TID WC   levothyroxine  100 mcg Per Tube Q0600   mouth rinse  15 mL Mouth Rinse BID   metoprolol tartrate  50 mg Per Tube Q6H   multivitamin with minerals  1 tablet Oral Daily   pantoprazole (PROTONIX) IV  40 mg Intravenous Daily   polyethylene glycol  17 g Per Tube Daily   sodium chloride flush  3 mL Intravenous Q12H   venlafaxine  100 mg Per Tube BID    Objective: Vital signs in last 24 hours: Temp:  [97.4 F (36.3 C)-98.3 F (36.8 C)] 97.7 F (36.5 C) (11/10 1624) Pulse Rate:  [97-140] 124 (11/10 1711) Resp:  [17-20] 20 (11/10 1624) BP: (137-169)/(101-125) 137/101 (11/10 1711) SpO2:  [91 %-100 %] 96 % (11/10 1711) Weight:  [97.9 kg] 97.9 kg (11/10 0454)  PHYSICAL EXAM:  General: awake, resp distress Nsal cannula not  fitting properly- mask  Aphasia Rt hemiparesis  Lungs: b/l air entry- Heart: irregular. Chest b/l rhonchi Abdomen: Soft, non-tender,not distended. Bowel sounds normal. No masses Extremities: left foot dressing not removed     Skin: No rashes or lesions. Or bruising Lymph: Cervical, supraclavicular normal. Neurologic: rt hemiparesis  Lab Results Recent Labs    11/20/20 0504 11/21/20 0510  WBC 10.7* 12.6*  HGB 8.4* 8.4*  HCT 26.5* 26.9*  NA 142 140  K 5.0 4.9  CL 107 108  CO2 26 25  BUN 81* 71*  CREATININE 3.32* 3.38*   Liver Panel Recent Labs    11/19/20 0543 11/20/20 0504  PROT 4.9* 5.5*  ALBUMIN 2.1* 2.3*  AST 19 20  ALT 16 16  ALKPHOS 57 67  BILITOT 0.8 0.6   [place] Microbiology: MRSA nares  not detected 11/13/20 BC Foot abscess culture Pasteurella, rare staph and strep mitis, peptostreptococcus, bacteroides Studies/Results: CT HEAD WO CONTRAST (5MM)  Result Date: 11/21/2020 CLINICAL DATA:  Altered mental status EXAM: CT HEAD WITHOUT CONTRAST TECHNIQUE: Contiguous axial images were obtained from the base of the skull through the vertex without intravenous contrast. COMPARISON:  Previous studies including the examination of 11/19/2020 FINDINGS: Brain: There are  no signs of bleeding within the cranium. There is small area of encephalomalacia in the left frontal cortex suggests recent infarct. There is no focal mass effect. Cortical sulci are prominent. There is decreased density in periventricular and subcortical white matter. Vascular: Unremarkable. Skull: Unremarkable. Sinuses/Orbits: Unremarkable. Other: None IMPRESSION: There are no signs of bleeding within the cranium. No significant changes are noted in recent infarct in the left frontal cortex. Atrophy. Small-vessel disease. Electronically Signed   By: Elmer Picker M.D.   On: 11/21/2020 09:40   CT HEAD WO CONTRAST (5MM)  Result Date: 11/19/2020 CLINICAL DATA:  Stroke follow-up.  Rule out hemorrhage.  EXAM: CT HEAD WITHOUT CONTRAST TECHNIQUE: Contiguous axial images were obtained from the base of the skull through the vertex without intravenous contrast. COMPARISON:  Brain MRI dated 11/19/2020.  Head CT dated 11/13/2020. FINDINGS: Brain: Mild age-related atrophy and chronic microvascular ischemic changes. Focal area of white matter hypodensity in the left frontal convexity noted which appears more conspicuous compared to the CT of 11/13/2020. Or there is no acute intracranial hemorrhage. No mass effect midline shift no extra-axial fluid collection. Vascular: No hyperdense vessel or unexpected calcification. Skull: Normal. Negative for fracture or focal lesion. Sinuses/Orbits: No acute finding. Other: None IMPRESSION: 1. No acute intracranial hemorrhage. 2. Mild age-related atrophy and chronic microvascular ischemic changes. 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 Electronically Signed   By: Anner Crete M.D.   On: 11/19/2020 22:35   DG Chest Port 1 View  Result Date: 11/21/2020 CLINICAL DATA:  Acute hypoxic respiratory failure EXAM: PORTABLE CHEST 1 VIEW COMPARISON:  11/15/2020 FINDINGS: Cardiomegaly with mild interstitial edema. Mild right infrahilar opacity, favoring edema, although superimposed right lower lobe pneumonia is not excluded. Possible small left pleural effusion. No pneumothorax. IMPRESSION: Cardiomegaly with mild interstitial edema and possible small left pleural effusion. Mild right infrahilar opacity, favoring edema, superimposed right lower lobe pneumonia not excluded. Electronically Signed   By: Julian Hy M.D.   On: 11/21/2020 03:16     Assessment/Plan:  Resp distress - was intubated, now extubated  Hasdistress again yesterday and found to have CHF/Afib Management as per cards  Altered mental status due to CVA- improved MRI shows Acute to early subacute infarction in the left posterior frontal cortical and subcortical brain, possibly  affecting the precentral gyrus. Has rt hemiparesis and expressive aphasia  Diabetes mellitus with Left foot ulcer with infection and sepsis  underwent I/D and 2nd ray excision polymicrobial Culture MSSA, pasteurella, strep mitis The surgical wound looks heathy  On unasyn As pathology shows osteo at the bone margin will give IV antibiotics for 4 weeks until 12/09/20 -followed by PO augmentin until 12/23/20  Left foot surgical site bleeding secondary to heparin IV which has been discontinued     Afib-RVR on diltiazem and metoprolol-followed by cardiology Severe anemia- got blood transfusion  AKI on CKD- worsening Avoid nephrotoxic drugs   DM    NASH leading to cirrhosis Rt heart failure   Hypothyroidism on synthroid-     Discussed the management with  care team

## 2020-11-21 NOTE — Progress Notes (Addendum)
Speech Language Pathology Treatment: Dysphagia  Patient Details Name: Autumn [name] MRN: [id number] DOB: 03/18/1950 Today's Date: 11/21/2020 Time: 7672-0947 SLP Time Calculation (min) (ACUTE ONLY): 45 min  Assessment / Plan / Recommendation Clinical Impression  Pt seen today for ongoing assessment of toleration of oral diet; dysphagia therapy. Per MD and chart notes, pt had a Significant Event this morning at ~3AM in which she c/o respiratory distress w/ increased RR and HR. BIPAP was placed and pt immediately appeared more comfortable and began to rest. CXR revealed: "Cardiomegaly with mild interstitial edema and possible small left pleural effusion. Mild right infrahilar opacity, favoring edema, superimposed right lower lobe pneumonia not excluded.". Head CT: "No significant changes are noted in recent infarct in the left frontal cortex. Atrophy. Small-vessel disease.".  IV fluids stopped per MD; MD requested ST reassess pt's safety w/ swallowing making pt NPO until then.  Pt stated she felt her breathing was "better" at this session. Pt was alert, verbally responsive and engaged in conversation w/ SLP; Dysarthria appeared min increased w/ slightly decreased intelligibility in speech from yesterday. Pt's Motor Speech effort seemed increased, though w/ over-articulation and increased volume strategies, intelligibility of speech increased. OM Exam revealed similar R lingual deviation and decreased strength as at previous session; slow lingual movements. R labial weakness/decreased tone. Pt explained general aspiration precautions and agreed verbally to the need for following them especially sitting upright for all oral intake and using SMALL, single sips. Pt assisted w/ positioning d/t min weakness. She c/o mild SOB w/ the exertion; noted similar increased WOB at rest; HR mildly elevated at rest(120s). She feed herself Single trials of ice chips, thin and Nectar liquids (via TSP, Cup)and puree w/  overt clinical s/s of aspiration (coughing x1/3) noted w/ thin liquids; no further s/s were noted w/ any other consistency -- respiratory status remained overtly unlabored, vocal quality clear b/t trials, no further coughing. Oral phase appeared grossly [facility] for bolus management and timely A-P transfer for swallowing; oral clearing achieved w/ consistencies.   Due to recent Significant Event, pt's recent CVA and current Dysphagia and Motor Speech deficits, and pt's overt coughing w/ thin liquids, pt appears at risk for aspiration currently. W/ a modified diet and when following aspiration precautions, this risk appears reduced. Recommend use of a Pureed diet again w/ gravies added to moisten foods; Nectar liquids via Cup. Recommend aspiration precautions; Pills Whole vs Crushed in Puree; tray setup and positioning assistance for meals; Supervision for any s/s of aspiration, following precautions, and to monitor respiratory status during oral intake. ST services will continue to monitor and follow for dysphagia tx. MD/NSG updated. Precautions posted at bedside.       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  She'd been following with a [facility] podiatrist outpatient and had recently refused surgery.  She'd recently been treated with antibiotics at [facility] for the diabetic foot infection with enterobacter aerogans culture positive.  She was admitted on 10/29 due to falls at home with worsening L foot chronic diabetic ulcer with osteomyelitis of the 2nd and 3rd metatarsals and cellulitis noted on MRI.  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  Her post operative course was complicated by unresponsiveness and neurologic changes on 11/13/2021. She was found to have a stroke (MRI Brain showed acute to  early subacute infarction in the left posterior  frontal cortical and subcortical brain, possibly affecting the precentral gyrus) and was intubated for airway protection.  She was extubated 11/3 and transferred to [facility] service on 11/4.      SLP Plan  Continue with current plan of care (mbs TBD)      Recommendations for follow up therapy are one component of a multi-disciplinary discharge planning process, led by the attending physician.  Recommendations may be updated based on patient status, additional functional criteria and insurance authorization.    Recommendations  Diet recommendations: Dysphagia 1 (puree);Nectar-thick liquid Liquids provided via: Cup;No straw Medication Administration: Whole meds with puree (vs need to CRUSH in puree) Supervision: Patient able to self feed;Staff to assist with self feeding;Intermittent supervision to cue for compensatory strategies Compensations: Minimize environmental distractions;Slow rate;Small sips/bites;Lingual sweep for clearance of pocketing;Follow solids with liquid Postural Changes and/or Swallow Maneuvers: Out of bed for meals;Seated upright 90 degrees;Upright 30-60 min after meal                General recommendations:  (Dietician f/u) Oral Care Recommendations: Oral care BID;Oral care before and after PO;Staff/trained caregiver to provide oral care Follow Up Recommendations: Skilled nursing-short term rehab (<3 hours/day) Assistance recommended at discharge: Intermittent Supervision/Assistance SLP Visit Diagnosis: Dysphagia, oropharyngeal phase (R13.12) Plan: Continue with current plan of care (mbs TBD)       GO                  Orinda Kenner, [place], [place] Pathologist Rehab Services [phone] [facility]  11/21/2020, 2:33 PM

## 2020-11-21 NOTE — Consult Note (Addendum)
WOC Nurse Consult Note: Patient receiving care in [facility]. Reason for Consult: "Wound VAC placement right left foot a" Wound type: left foot surgical wound to dorsum and plantar surfaces of left foot. Pressure Injury POA: Yes/No/NA Measurement: plantar surface wound measures 1.5 cm x 1.3 cm x 1.1 cm. The wound bed is red, there was serosanginous drainage on the existing gauze dressing.  The dorsal wound measures 12 cm x 0.6 cm x 3.1 cm depth at the most distal suture area.  See photo dated 11/18/20 for further clarification of where two sutures in this wound. Wound bed: possible tendon at the most superior aspect (seen in the 11/18/20 photo also). The remainder of the wound was pink. Drainage (amount, consistency, odor) no odor. Periwound: intact Dressing procedure/placement/frequency: One small piece of black foam placed into the plantar wound, drape and suction applied, immediate seal obtained.  Saline moistened roll gauze was placed into the dorsal wound and tucked into the deepest area at the distal suture.  Because of the wound characteristics, it was not possible to get VAC sponge into this dorsal wound.    The patient did not indicate any discomfort during my wound care activities.  Orders for bedside nurse to perform VAC changes on T-Th-Sat to the plantar wound, and twice daily saline dressings to the dorsal wound were entered.  Monitor the wound area(s) for worsening of condition such as: Signs/symptoms of infection,  Increase in size,  Development of or worsening of odor, Development of pain, or increased pain at the affected locations.  Notify the medical team if any of these develop.  Thank you for the consult. [place] nurse will not follow at this time.  Please re-consult the [place] team if needed.  Val Riles, RN, MSN, CWOCN, CNS-BC, pager [phone]

## 2020-11-21 NOTE — Progress Notes (Signed)
PT Cancellation Note  Patient Details Name: Autumn [name] MRN: [id number] DOB: 04/17/1950   Cancelled Treatment:    Reason Eval/Treat Not Completed: Medical issues which prohibited therapy Entered pt's room, noted to have elevated HR 120s-140s consistently just sitting in bed.  She was able to maintain O2 in the 90s but with some labored breathing.  Spoke to nurse about elevated HR at rest.  Held PT at this time, will continue to follow remotely.   Kreg Shropshire, DPT 11/21/2020, 2:09 PM

## 2020-11-21 NOTE — Progress Notes (Signed)
PROGRESS NOTE    Autumn [name]  NGE:[id number] DOB: August 22, 1950 DOA: 11/09/2020 PCP: Harlow Ohms, MD  [id number]   Assessment & Plan:   Principal Problem:   Sepsis [facility]) Active Problems:   Atrial fibrillation with RVR ([place])   Osteomyelitis of foot, left, acute ([place])   CKD stage 4 due to type 2 diabetes mellitus ([place])   Liver cirrhosis secondary to NASH (nonalcoholic steatohepatitis) ([place])   Hypertension   History of anemia due to CKD   Frequent falls   AKI (acute kidney injury) ([place])   Cellulitis and abscess of foot, except toes   Infectious tenosynovitis   [place]   Atrial fibrillation with rapid ventricular response (HCC)   Bilateral carotid artery stenosis   70 yo F with history of T2DM, CKD, 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  She'd been following with a [facility] podiatrist outpatient and had recently refused surgery.  She'd recently been treated with antibiotics at [facility] for the diabetic foot infection with enterobacter aerogans culture positive.  She was admitted on 10/29 due to falls at home with worsening L foot chronic diabetic ulcer with osteomyelitis of the 2nd and 3rd metatarsals and cellulitis noted on MRI.  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  Her post operative course was complicated by unresponsiveness and neurologic changes, she was found to have a stroke and was intubated for airway protection.  She was extubated 11/3 and transferred to [facility] service on 11/4.   Acute Blood Loss Anemia Iron def In setting of heparin and recent surgical procedure - postop bleeding from surgical wound --s/p 2 units pRBC, Hgb stable in 8's. Labs with iron def, vit B12 elevated, folate wnl Plan: --monitor Hgb --cont iron suppl   Stroke Neurology suspects periprocedural vs related to atrial fibrillation MRI brian with acute to early subacute  infarction in the L posterior frontal cortical and subcortical brain, possibly affecting the precentral gyrus.   --Echo with EF 55-60%, dilated LA, patent foramen ovale --carotid US right ICA artery worrisome for a subtotal occlusion.  Moderate to large amount of L sided atheroscelotic plaque Plan: --Left carotid angiogram with Dr. Delana Meyer on Friday --hold anticoagulation for angiogram, per Dr. Delana Meyer   Acute Metabolic Encephalopathy Related to acute infection and above Delirium precautions Follow and w/u further as indicated   Septic Shock Diabetic Foot Infection Left Foot Osteomyelitis and Cellulitis  Septic Arthritis MRI foot with septic arthritis of second MTP joint and osteomyelitis of 2nd metatarsal head and second proximal phalanx, severe soft tissue swelling around second metatarsal neck concerning for phlegmon developing abscess measuring 2.1x1 cm circumferentially around distal shaft.  Mild early osteomyelitis of 3rd metatarsal head.  Severe soft tissue edema of the forefoot c/w cellulitis. MRI ankle limited -> interval resection of 2nd metatarsal, longitudinal tear of peroneus brevis with peroneus tenosynovitis, distal tibialis posterior tenosynovitis and tendinopathy, extensor digitorum tenosynovitis.  Extensive subcutaneous edema circumferentially in distal calf.  Dorsal subcutaneous edema in foot. S/p 2nd ray amputation, I&D, and bone bx of 3rd metatarsal on 11/1 --ID consulted Plan: --cont Unasyn --NWB LLE --cont wound VAC   Acute Hypoxic Respiratory Failure Aspiration Pneumonia, treated Pulm edema 2/2 fluid overload CXR 11/3 with right lung opacity concerning for superimposed infection - also with findings concerning for pulmonary edema and small right effusion --aspiration PNA considered treated with IV Unasyn --rapid called early  morning 11/10 for respiratory distress.  CXR showed pulm edema (pt had received MIVF for 4 days before I d/c'ed it yesterday) .  Started on  BiPAP, and received IV lasix 40 Plan: --IV lasix 80 mg x1  --cont BiPAP PRN for respiratory distress  Atrial Fibrillation with RVR --went into RVR today due to meds on hold --cont metop and dilt --hold anticoagulation ahead of upcoming angiogram with vascular   Thrombocytopenia --HIT ab neg.   Dysphagia 2/2 stroke SLP re-eval today --cont dys 1 nectar thick diet   AKI on CKD IV Baseline creatinine around 3.2 --nephrology consulted --pt had received MIVF for 4 days before I d/c'ed it yesterday --Monitor Cr while diuresing   Elevated Troponin Cardiology following, thought 2/2 demand   Hx NASH cirrhosis --compensated    Hypothyroidism --cont Synthroid   T2DM SSI, follow A1c 6.3   DVT prophylaxis: SCD/Compression stockings Code Status: Full code  Family Communication:  Level of care: Progressive Cardiac Dispo:   The patient is from: home Anticipated d/c is to: [facility] Anticipated d/c date is: 2-3 days Patient currently is not medically ready to d/c due to: pending carotid angiogram on Friday   Subjective and Interval History:  Overnight, rapid was called due to acute onset respiratory distress.  Pt was put on BiPAP, and CXR showed pulm edema.  IV diuresis started.  Pt was on and off BiPAP throughout the day.  SLP cleared pt for dys 1 nectar thick diet.   Objective: Vitals:   11/21/20 1243 11/21/20 1256 11/21/20 1439 11/21/20 1547  BP: (!) 158/125 (!) 152/109 (!) 152/118   Pulse: (!) 135 (!) 140 (!) 114 (!) 132  Resp:   17 18  Temp: 97.6 F (36.4 C)  (!) 97.4 F (36.3 C)   TempSrc:      SpO2: 98%  98% 93%  Weight:      Height:       No intake or output data in the 24 hours ending 11/21/20 1621  Filed Weights   11/19/20 0412 11/20/20 0338 11/21/20 0454  Weight: 95.6 kg 95.8 kg 97.9 kg    Examination:   Constitutional: NAD CV: No cyanosis.   RESP: on BiPAP Extremities: mild edema in LLE, foot wrapped, wound vac applied SKIN:  warm, dry   Data Reviewed: I have personally reviewed following labs and imaging studies  CBC: Recent Labs  Lab 11/16/20 0246 11/17/20 0815 11/18/20 0227 11/18/20 1237 11/18/20 2038 11/19/20 0543 11/20/20 0504 11/21/20 0510  WBC 10.8* 9.3 10.6*  --   --  12.5* 10.7* 12.6*  NEUTROABS 8.5* 6.9 8.2*  --   --  9.6* 7.9*  --   HGB 8.0* 8.2* 6.4* 7.3* 9.0* 8.5* 8.4* 8.4*  HCT 25.2* 26.2* 20.5* 23.0* 27.4* 26.6* 26.5* 26.9*  MCV 86.3 88.8 89.1  --   --  91.4 93.3 94.7  PLT 168 134* 124*  --   --  104* 108* 784*   Basic Metabolic Panel: Recent Labs  Lab 11/16/20 0246 11/17/20 0815 11/18/20 0227 11/19/20 0543 11/20/20 0504 11/21/20 0510  NA 139 139 143 141 142 140  K 3.9 4.1 4.4 4.6 5.0 4.9  CL 101 102 106 107 107 108  CO2 25 28 26 25 26 25   GLUCOSE 166* 175* 194* 136* 132* 133*  BUN 99* 97* 95* 79* 81* 71*  CREATININE 4.79* 4.24* 3.89* 3.36* 3.32* 3.38*  CALCIUM 8.2* 8.5* 8.5* 8.8* 9.0 8.6*  MG 2.4 2.1 2.0 2.1 2.1 1.9  PHOS 5.9* 4.9* 3.8 3.8 4.0  --    GFR: Estimated Creatinine Clearance: 18.8 mL/min (A) (by C-G formula based on SCr of 3.38 mg/dL (H)). Liver Function Tests: Recent Labs  Lab 11/16/20 0246 11/17/20 0815 11/18/20 0227 11/19/20 0543 11/20/20 0504  AST 19 19 19 19 20   ALT 18 18 15 16 16   ALKPHOS 59 66 54 57 58  BILITOT 0.9 0.7 0.8 0.8 0.6  PROT 5.6* 5.8* 5.0* 4.9* 5.5*  ALBUMIN 2.1* 2.3* 2.0* 2.1* 2.3*   No results for input(s): LIPASE, AMYLASE in the last 168 hours. No results for input(s): AMMONIA in the last 168 hours. Coagulation Profile: No results for input(s): INR, PROTIME in the last 168 hours. Cardiac Enzymes: No results for input(s): CKTOTAL, CKMB, CKMBINDEX, TROPONINI in the last 168 hours. BNP (last 3 results) No results for input(s): PROBNP in the last 8760 hours. HbA1C: No results for input(s): HGBA1C in the last 72 hours. CBG: Recent Labs  Lab 11/20/20 1716 11/20/20 2029 11/21/20 0250 11/21/20 0808 11/21/20 1204  GLUCAP  149* 171* 148* 119* 162*   Lipid Profile: No results for input(s): CHOL, HDL, LDLCALC, TRIG, CHOLHDL, LDLDIRECT in the last 72 hours. Thyroid Function Tests: No results for input(s): TSH, T4TOTAL, FREET4, T3FREE, THYROIDAB in the last 72 hours. Anemia Panel: No results for input(s): VITAMINB12, FOLATE, FERRITIN, TIBC, IRON, RETICCTPCT in the last 72 hours. Sepsis Labs: Recent Labs  Lab 11/21/20 0510  PROCALCITON 0.10    Recent Results (from the past 240 hour(s))  MRSA Next Gen by PCR, Nasal     Status: None   Collection Time: 11/13/20 11:22 AM   Specimen: Nasal Mucosa; Nasal Swab  Result Value Ref Range Status   MRSA by PCR Next Gen NOT DETECTED NOT DETECTED Final    Comment: (NOTE) The GeneXpert MRSA Assay (FDA approved for NASAL specimens only), is one component of a comprehensive MRSA colonization surveillance program. It is not intended to diagnose MRSA infection nor to guide or monitor treatment for MRSA infections. Test performance is not FDA approved in patients less than 69 years old. Performed at [facility], [address] [place], [place] [address]   CULTURE, BLOOD (ROUTINE X 2) w Reflex to ID Panel     Status: None   Collection Time: 11/13/20 12:44 PM   Specimen: BLOOD  Result Value Ref Range Status   Specimen Description BLOOD LEFT ANTECUBITAL  Final   Special Requests   Final    BOTTLES DRAWN AEROBIC AND ANAEROBIC Blood Culture adequate volume   Culture   Final    NO GROWTH 5 DAYS Performed at [facility], [address] [place], [place] [address]    Report Status 11/18/2020 FINAL  Final  CULTURE, BLOOD (ROUTINE X 2) w Reflex to ID Panel     Status: None   Collection Time: 11/13/20 12:52 PM   Specimen: BLOOD  Result Value Ref Range Status   Specimen Description BLOOD BLOOD LEFT HAND  Final   Special Requests   Final    BOTTLES DRAWN AEROBIC ONLY Blood Culture results may not be optimal due to an inadequate volume of blood received  in culture bottles   Culture   Final    NO GROWTH 5 DAYS Performed at [facility], [address] [place], [place] [address]    Report Status 11/18/2020 FINAL  Final  Culture, Respiratory w Gram Stain     Status: None   Collection Time: 11/13/20  3:43 PM  Specimen: Tracheal Aspirate; Respiratory  Result Value Ref Range Status   Specimen Description   Final    TRACHEAL ASPIRATE Performed at [facility], [address], [place], [place] [address]    Special Requests   Final    NONE Performed at [facility], [place], [place] [address]    Gram Stain   Final    NO SQUAMOUS EPITHELIAL CELLS SEEN FEW WBC SEEN NO ORGANISMS SEEN Performed at [facility], [place] [address] [place], [place] [address]    Culture RARE CANDIDA ALBICANS  Final   Report Status 11/16/2020 FINAL  Final      Radiology Studies: CT HEAD WO CONTRAST (5MM)  Result Date: 11/21/2020 CLINICAL DATA:  Altered mental status EXAM: CT HEAD WITHOUT CONTRAST TECHNIQUE: Contiguous axial images were obtained from the base of the skull through the vertex without intravenous contrast. COMPARISON:  Previous studies including the examination of 11/19/2020 FINDINGS: Brain: There are no signs of bleeding within the cranium. There is small area of encephalomalacia in the left frontal cortex suggests recent infarct. There is no focal mass effect. Cortical sulci are prominent. There is decreased density in periventricular and subcortical white matter. Vascular: Unremarkable. Skull: Unremarkable. Sinuses/Orbits: Unremarkable. Other: None IMPRESSION: There are no signs of bleeding within the cranium. No significant changes are noted in recent infarct in the left frontal cortex. Atrophy. Small-vessel disease. Electronically Signed   By: Elmer Picker M.D.   On: 11/21/2020 09:40   CT HEAD WO CONTRAST (5MM)  Result Date: 11/19/2020 CLINICAL DATA:  Stroke follow-up.  Rule out  hemorrhage. EXAM: CT HEAD WITHOUT CONTRAST TECHNIQUE: Contiguous axial images were obtained from the base of the skull through the vertex without intravenous contrast. COMPARISON:  Brain MRI dated 11/19/2020.  Head CT dated 11/13/2020. FINDINGS: Brain: Mild age-related atrophy and chronic microvascular ischemic changes. Focal area of white matter hypodensity in the left frontal convexity noted which appears more conspicuous compared to the CT of 11/13/2020. Or there is no acute intracranial hemorrhage. No mass effect midline shift no extra-axial fluid collection. Vascular: No hyperdense vessel or unexpected calcification. Skull: Normal. Negative for fracture or focal lesion. Sinuses/Orbits: No acute finding. Other: None IMPRESSION: 1. No acute intracranial hemorrhage. 2. Mild age-related atrophy and chronic microvascular ischemic changes. 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 Electronically Signed   By: Anner Crete M.D.   On: 11/19/2020 22:35   DG Chest Port 1 View  Result Date: 11/21/2020 CLINICAL DATA:  Acute hypoxic respiratory failure EXAM: PORTABLE CHEST 1 VIEW COMPARISON:  11/15/2020 FINDINGS: Cardiomegaly with mild interstitial edema. Mild right infrahilar opacity, favoring edema, although superimposed right lower lobe pneumonia is not excluded. Possible small left pleural effusion. No pneumothorax. IMPRESSION: Cardiomegaly with mild interstitial edema and possible small left pleural effusion. Mild right infrahilar opacity, favoring edema, superimposed right lower lobe pneumonia not excluded. Electronically Signed   By: Julian Hy M.D.   On: 11/21/2020 03:16     Scheduled Meds:  sodium chloride   Intravenous Once   allopurinol  200 mg Per Tube Daily   vitamin C  500 mg Per Tube BID   budesonide (PULMICORT) nebulizer solution  0.5 mg Nebulization BID   Chlorhexidine Gluconate Cloth  6 each Topical Daily   diltiazem  60 mg Per Tube Q6H   docusate  100 mg Per  Tube BID   feeding supplement  237 mL Oral TID BM   ferrous gluconate  324 mg Oral Q breakfast   insulin aspart  0-6 Units Subcutaneous TID WC   levothyroxine  100 mcg Per Tube Q0600   mouth rinse  15 mL Mouth Rinse BID   metoprolol tartrate  50 mg Per Tube Q6H   multivitamin with minerals  1 tablet Oral Daily   pantoprazole (PROTONIX) IV  40 mg Intravenous Daily   polyethylene glycol  17 g Per Tube Daily   sodium chloride flush  3 mL Intravenous Q12H   venlafaxine  100 mg Per Tube BID   Continuous Infusions:  sodium chloride 5 mL/hr at 11/18/20 0700   sodium chloride     ampicillin-sulbactam (UNASYN) IV 3 g (11/21/20 1407)     LOS: 12 days     Enzo Bi, MD [facility] If 7PM-7AM, please contact night-coverage 11/21/2020, 4:21 PM

## 2020-11-21 NOTE — Significant Event (Addendum)
[facility] Cross Coverage Note - No charge  I received secure chat that a rapid response was called for increased respiratory rate, shortness of breath, wheezing.   A breathing trial was ordered by the RR team.  Reviewed vitals.  BP 152/124 spO2 is 95% on RA HR elevated 80s-115 Patient placed on 2.5 L [place]  Reviewed chart: Patient is status post amputation left foot 2nd and 3rd metatarsal on 10/31 and developed post op blood loss anemia requiring 2 units PRBC. A rapid response was called on 11/2 and patient required intubation. She was extubated on 11/3. Respiratory distress concern for possible pna/right lung opacity/septic shock from osteomyelitis. Unasyn was started for pneumonia and OM.  She was also found to have acute to early subacute infarction in the left posterior frontal cortical and subcortical brain. No mass effect or hemorrhage.   Repeat CT head w/o contrast on 11/19/20 was read as no acute intracranial hemorrhage.   A/P  # Acute respiratory distress  - etiology work up in progress - STAT port CXR, cbg, trop, cbc, abg were ordered.  - CTA chest was considered however, patient has AKI on CKD, therefore this will be deferred at this time - Bipap trial was ordererd - In house attending hospitalist alerted  Further recommendations and plan will defer to in-house hospitalist  Dr. Tobie Poet

## 2020-11-21 NOTE — Progress Notes (Signed)
Progress Note  Patient Name: Autumn [name] Date of Encounter: 11/21/2020  [facility] Cardiologist: new to [place] this morning, sitting up, eating applesauce No complaints, reports she is feeling better, no significant breathing issues  Notes from overnight reviewed,: Was reportedly having lethargy, worsening speech/dysarthria overnight 5 AM Cross coverage held her beta-blocker and diltiazem ordered head CT made her n.p.o. On a rapid response blood pressure 171/111 heart rate 90 Labored breathing, wheezing Was placed on BiPAP  Inpatient Medications    Scheduled Meds:  sodium chloride   Intravenous Once   allopurinol  200 mg Per Tube Daily   vitamin C  500 mg Per Tube BID   budesonide (PULMICORT) nebulizer solution  0.5 mg Nebulization BID   Chlorhexidine Gluconate Cloth  6 each Topical Daily   diltiazem  60 mg Oral Q6H   docusate  100 mg Per Tube BID   feeding supplement  237 mL Oral TID BM   ferrous gluconate  324 mg Oral Q breakfast   insulin aspart  0-6 Units Subcutaneous TID WC   levothyroxine  100 mcg Per Tube Q0600   mouth rinse  15 mL Mouth Rinse BID   metoprolol tartrate  50 mg Oral Q6H   multivitamin with minerals  1 tablet Oral Daily   pantoprazole (PROTONIX) IV  40 mg Intravenous Daily   polyethylene glycol  17 g Per Tube Daily   sodium chloride flush  3 mL Intravenous Q12H   venlafaxine  100 mg Per Tube BID   Continuous Infusions:  sodium chloride 5 mL/hr at 11/18/20 0700   sodium chloride     ampicillin-sulbactam (UNASYN) IV 3 g (11/20/20 2135)   PRN Meds: sodium chloride, sodium chloride, [DISCONTINUED] acetaminophen **OR** acetaminophen, acetaminophen, albuterol, diltiazem, hydrALAZINE, meclizine, ondansetron **OR** ondansetron (ZOFRAN) IV, sodium chloride flush   Vital Signs    Vitals:   11/21/20 0807 11/21/20 1202 11/21/20 1243 11/21/20 1256  BP:  (!) 162/109 (!) 158/125 (!) 152/109  Pulse: (!) 108 (!) 129 (!) 135  (!) 140  Resp:  18    Temp:  97.6 F (36.4 C) 97.6 F (36.4 C)   TempSrc:      SpO2: 100% 100% 98%   Weight:      Height:       No intake or output data in the 24 hours ending 11/21/20 1331  Last 3 Weights 11/21/2020 11/20/2020 11/19/2020  Weight (lbs) 215 lb 13.3 oz 211 lb 3.2 oz 210 lb 12.2 oz  Weight (kg) 97.9 kg 95.8 kg 95.6 kg      Telemetry    Atrial fibrillation rate 110-120 beats per minute  Personally Reviewed  ECG     - Personally Reviewed  Physical Exam   Constitutional:  oriented to person, place, and time. No distress.  Dysarthria HENT:  Head: Grossly normal Eyes:  no discharge. No scleral icterus.  Neck: No JVD, no carotid bruits  Cardiovascular: Irregularly irregular, rapid no murmurs appreciated Pulmonary/Chest: Clear to auscultation bilaterally, no wheezes or rails Abdominal: Soft.  no distension.  no tenderness.  Musculoskeletal: Normal range of motion Neurological:  normal muscle tone. Coordination normal. No atrophy Skin: Skin warm and dry Psychiatric: normal affect, pleasant  Labs    High Sensitivity Troponin:   Recent Labs  Lab 11/09/20 1220 11/09/20 1818 11/10/20 0115 11/10/20 0649 11/21/20 0510  TROPONINIHS 147* 151* 162* 149* 81*     Chemistry Recent Labs  Lab 11/18/20 0227 11/19/20 0543 11/20/20  1027 11/21/20 0510  NA 143 141 142 140  K 4.4 4.6 5.0 4.9  CL 106 107 107 108  CO2 26 25 26 25   GLUCOSE 194* 136* 132* 133*  BUN 95* 79* 81* 71*  CREATININE 3.89* 3.36* 3.32* 3.38*  CALCIUM 8.5* 8.8* 9.0 8.6*  MG 2.0 2.1 2.1 1.9  PROT 5.0* 4.9* 5.5*  --   ALBUMIN 2.0* 2.1* 2.3*  --   AST 19 19 20   --   ALT 15 16 16   --   ALKPHOS 54 57 58  --   BILITOT 0.8 0.8 0.6  --   GFRNONAA 12* 14* 15* 14*  ANIONGAP 11 9 9 7     Lipids  No results for input(s): CHOL, TRIG, HDL, LABVLDL, LDLCALC, CHOLHDL in the last 168 hours.   Hematology Recent Labs  Lab 11/19/20 0543 11/20/20 0504 11/21/20 0510  WBC 12.5* 10.7* 12.6*  RBC 2.91*  2.84* 2.84*  HGB 8.5* 8.4* 8.4*  HCT 26.6* 26.5* 26.9*  MCV 91.4 93.3 94.7  MCH 29.2 29.6 29.6  MCHC 32.0 31.7 31.2  RDW 15.9* 16.4* 17.2*  PLT 104* 108* 113*   Thyroid  No results for input(s): TSH, FREET4 in the last 168 hours.   BNP Recent Labs  Lab 11/21/20 0510  BNP 589.6*    DDimer No results for input(s): DDIMER in the last 168 hours.   Radiology    CT HEAD WO CONTRAST (5MM)  Result Date: 11/21/2020 CLINICAL DATA:  Altered mental status EXAM: CT HEAD WITHOUT CONTRAST TECHNIQUE: Contiguous axial images were obtained from the base of the skull through the vertex without intravenous contrast. COMPARISON:  Previous studies including the examination of 11/19/2020 FINDINGS: Brain: There are no signs of bleeding within the cranium. There is small area of encephalomalacia in the left frontal cortex suggests recent infarct. There is no focal mass effect. Cortical sulci are prominent. There is decreased density in periventricular and subcortical white matter. Vascular: Unremarkable. Skull: Unremarkable. Sinuses/Orbits: Unremarkable. Other: None IMPRESSION: There are no signs of bleeding within the cranium. No significant changes are noted in recent infarct in the left frontal cortex. Atrophy. Small-vessel disease. Electronically Signed   By: Elmer Picker M.D.   On: 11/21/2020 09:40   CT HEAD WO CONTRAST (5MM)  Result Date: 11/19/2020 CLINICAL DATA:  Stroke follow-up.  Rule out hemorrhage. EXAM: CT HEAD WITHOUT CONTRAST TECHNIQUE: Contiguous axial images were obtained from the base of the skull through the vertex without intravenous contrast. COMPARISON:  Brain MRI dated 11/19/2020.  Head CT dated 11/13/2020. FINDINGS: Brain: Mild age-related atrophy and chronic microvascular ischemic changes. Focal area of white matter hypodensity in the left frontal convexity noted which appears more conspicuous compared to the CT of 11/13/2020. Or there is no acute intracranial hemorrhage. No mass  effect midline shift no extra-axial fluid collection. Vascular: No hyperdense vessel or unexpected calcification. Skull: Normal. Negative for fracture or focal lesion. Sinuses/Orbits: No acute finding. Other: None IMPRESSION: 1. No acute intracranial hemorrhage. 2. Mild age-related atrophy and chronic microvascular ischemic changes. 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 Electronically Signed   By: Anner Crete M.D.   On: 11/19/2020 22:35   MR ANGIO HEAD WO CONTRAST  Result Date: 11/19/2020 CLINICAL DATA:  Stroke, follow up. EXAM: MRA NECK WITHOUT CONTRAST MRA HEAD WITHOUT CONTRAST TECHNIQUE: Angiographic images of the Circle of Willis were acquired using MRA technique without intravenous contrast. COMPARISON:  None. FINDINGS: MRA NECK FINDINGS The study is degraded  by motion, particularly at the skull base. Luminal irregularity along the petrous segment of the bilateral internal carotid arteries with apparent high-grade stenosis, likely artifactual. Mild luminal irregularity of the cavernous segment of the bilateral internal carotid arteries. Normal caliber and flow related enhancement of the bilateral ACA vascular trees. Attenuation of flow in the M3/MCA branches bilaterally is likely artifactual. Normal caliber and flow related enhancement of the visualized portions of the V4 segment of the bilateral vertebral arteries. The basilar artery and bilateral posterior cerebral arteries are maintained. MRA HEAD FINDINGS The study is severely degraded by motion, particularly at the level of the carotid bifurcations. Normal flow related enhancement seen in the visualized portions of the bilateral common carotid artery. Attenuation of flow at the level of the carotid bulbs and in the distal cervical segment may be artifactual. Note is made of retropharyngeal course of the bilateral common carotid arteries and proximal cervical internal carotid arteries. Normal caliber and flow related  enhancement of the visualized V2 segment of the bilateral vertebral arteries. Attenuation of flow at the V3 segment is artifactual. IMPRESSION: 1. Motion degraded study significantly limiting evaluation. Consider repeat study or CT angiogram of the head and neck when clinically appropriate. 2. Luminal irregularity with high-grade stenosis in the petrous segment of the bilateral internal carotid arteries are likely artifactual. Likewise, attenuation of flow in the bilateral M3/MCA segments are likely artifactual. However, superimposed atherosclerotic stenosis cannot be excluded. 3. 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 4. Normal caliber of the visualized portions of the common carotid arteries and proximal carotid bifurcation with attenuation of flow in the bulbs and in the distal cervical segments, likely artifactual. Electronically Signed   By: Pedro Earls M.D.   On: 11/19/2020 15:41   MR ANGIO NECK WO CONTRAST  Result Date: 11/19/2020 CLINICAL DATA:  Stroke, follow up. EXAM: MRA NECK WITHOUT CONTRAST MRA HEAD WITHOUT CONTRAST TECHNIQUE: Angiographic images of the Circle of Willis were acquired using MRA technique without intravenous contrast. COMPARISON:  None. FINDINGS: MRA NECK FINDINGS The study is degraded by motion, particularly at the skull base. Luminal irregularity along the petrous segment of the bilateral internal carotid arteries with apparent high-grade stenosis, likely artifactual. Mild luminal irregularity of the cavernous segment of the bilateral internal carotid arteries. Normal caliber and flow related enhancement of the bilateral ACA vascular trees. Attenuation of flow in the M3/MCA branches bilaterally is likely artifactual. Normal caliber and flow related enhancement of the visualized portions of the V4 segment of the bilateral vertebral arteries. The basilar artery and bilateral posterior cerebral arteries are maintained. MRA HEAD FINDINGS The  study is severely degraded by motion, particularly at the level of the carotid bifurcations. Normal flow related enhancement seen in the visualized portions of the bilateral common carotid artery. Attenuation of flow at the level of the carotid bulbs and in the distal cervical segment may be artifactual. Note is made of retropharyngeal course of the bilateral common carotid arteries and proximal cervical internal carotid arteries. Normal caliber and flow related enhancement of the visualized V2 segment of the bilateral vertebral arteries. Attenuation of flow at the V3 segment is artifactual. IMPRESSION: 1. Motion degraded study significantly limiting evaluation. Consider repeat study or CT angiogram of the head and neck when clinically appropriate. 2. Luminal irregularity with high-grade stenosis in the petrous segment of the bilateral internal carotid arteries are likely artifactual. Likewise, attenuation of flow in the bilateral M3/MCA segments are likely artifactual. However, superimposed atherosclerotic stenosis cannot be  excluded. 3. 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 4. Normal caliber of the visualized portions of the common carotid arteries and proximal carotid bifurcation with attenuation of flow in the bulbs and in the distal cervical segments, likely artifactual. Electronically Signed   By: Pedro Earls M.D.   On: 11/19/2020 15:41   DG Chest Port 1 View  Result Date: 11/21/2020 CLINICAL DATA:  Acute hypoxic respiratory failure EXAM: PORTABLE CHEST 1 VIEW COMPARISON:  11/15/2020 FINDINGS: Cardiomegaly with mild interstitial edema. Mild right infrahilar opacity, favoring edema, although superimposed right lower lobe pneumonia is not excluded. Possible small left pleural effusion. No pneumothorax. IMPRESSION: Cardiomegaly with mild interstitial edema and possible small left pleural effusion. Mild right infrahilar opacity, favoring edema, superimposed right  lower lobe pneumonia not excluded. Electronically Signed   By: Julian Hy M.D.   On: 11/21/2020 03:16    Cardiac Studies  2D echo 11/10/2020: 1. Left ventricular ejection fraction, by estimation, is 55 to 60%. The  left ventricle has normal function. The left ventricle has no regional  wall motion abnormalities. There is mild concentric left ventricular  hypertrophy. Left ventricular diastolic  parameters are indeterminate. Elevated left ventricular end-diastolic  pressure.   2. Right ventricular systolic function is normal. The right ventricular  size is normal. There is normal pulmonary artery systolic pressure.   3. Left atrial size was severely dilated.   4. The mitral valve is normal in structure. Trivial mitral valve  regurgitation. No evidence of mitral stenosis.   5. The aortic valve is tricuspid. Aortic valve regurgitation is not  visualized. No aortic stenosis is present.   6. The inferior vena cava is normal in size with greater than 50%  respiratory variability, suggesting right atrial pressure of 3 mmHg.   7. There is a small patent foramen ovale.   Patient Profile     RIPLEY LOVECCHIO is a 70 y.o. female with a hx of HFpEF, CKD stage IV-V, diabetes since her late 52s to early 74s with diabetic neuropathy and diabetic foot ulcer, HTN, anemia of chronic disease, hepatic cirrhosis, sleep apnea, and gout who is being seen today for the evaluation of Afib with RVR   Assessment & Plan   1.  Atrial fibrillation with RVR -Medications held overnight for respiratory distress We will restart her diltiazem 60 every 6, metoprolol 50 every 6 May need higher doses if rate continues to run high Eliquis for CHADS VASC 6 when cleared by neurology   2.  Respiratory distress Acute onset last night 2:30 AM requiring BiPAP Likely exacerbated by atrial fibrillation with RVR, CHF in the setting of arrhythmia and poorly controlled hypertension -Rate control medications restarted,  these were held and rate getting worse through the course of the morning -Doses of metoprolol and diltiazem may need to be increased for better blood pressure control  -Agree with IV Lasix given today, may need additional dose this afternoon and IV dose daily until respiratory status improves  2.  Elevated troponin Demand ischemia in the setting of osteomyelitis, atrial fibrillation with RVR, chronic kidney disease, anemia of chronic disease, acute stroke -Medical management recommended   3.  Sepsis/osteomyelitis On antibiotics Worsening left foot chronic diabetic ulcer with osteo-, cellulitis on MRI Has had amputations of toes, I&D of abscess -Podiatry following   4.  Acute stroke Rapid response soon after admission,  transferred to ICU, intubated November 2 with evolving neurologic changes, unresponsive Code stroke, MRI confirming acute stroke Followed by  neurology Etiology possibly from atrial fibrillation -Management of H fibrillation as above   Total encounter time more than 35 minutes  Greater than 50% was spent in counseling and coordination of care with the patient    For questions or updates, please contact [place] Please consult [email or web address] for contact info under        Signed, Ida Rogue, MD  11/21/2020, 1:31 PM

## 2020-11-21 NOTE — Progress Notes (Signed)
[facility] [employer]; No Charge  Received nursing message from RN staff that patient has increase lethargy with worsening speech dysarthria.   PO BP medications have been discontinued at this time.    # Mental status change - etiology work up in progress - IV hydralazine and cardizem ordered with parameters.  - CT Head w/o contrast ordered - NPO ordered now - SLP order for swallow study reassessment - Discontinue atorvastatin   Dr. Tobie Poet

## 2020-11-21 NOTE — Significant Event (Addendum)
Rapid Response Event Note   Reason for Call :  Respiratory distress   Initial Focused Assessment:  Patient BP 171/111 Hr 90 RR 35-40 02 94 on 3L Patient receiving breathing treatment on arrival to rapid. Patient with labored breathing and wheezing.  Patient Alert and able to follow commands appropriately.    Interventions:  Orders placed and completed  -ABG -CBG -CXR -EKG -Bipap After patient was placed on bipap Vitals signs greatly improved. BP 148/86 Hr 85 RR 20 O2 96 on 30% Bipap.  Plan of Care:  Patient placed on BiPAP. Frequent Monitoring of patient's respiratory status    Event Summary:  Follow Up 0500- Patient responding to Bipap well RR 18 o2 99%. Patient easily arousable and work of breathing less. Lasixs was also given. MD Notified: Rupert Stacks DO [phone] Call Time: [place]ia  Gonzella Lex, RN

## 2020-11-21 NOTE — H&P (View-Only) (Signed)
[facility] Daily Progress Note   Subjective: Patient without complaint this AM.  Patient did experience an acute episode of respiratory distress. Still with aphasia.  Objective: Vitals:   11/21/20 0454 11/21/20 0804 11/21/20 0806 11/21/20 0807  BP:  (!) 169/113    Pulse:  (!) 133  (!) 108  Resp:  19    Temp:  98.3 F (36.8 C)    TempSrc:      SpO2:  100% 99% 100%  Weight: 97.9 kg     Height:        Intake/Output Summary (Last 24 hours) at 11/21/2020 1030 Last data filed at 11/20/2020 1100 Gross per 24 hour  Intake 120 ml  Output 300 ml  Net -180 ml   Physical Exam: A&Ox2, NAD this AM CV: RRR Pulmonary: Decreased bilaterally Abdomen: Soft, Nontender, Nondistended Vascular: Mild edema.  Warm distally to toes.  Motor/sensory is intact.   Laboratory: CBC    Component Value Date/Time   WBC 12.6 (H) 11/21/2020 0510   HGB 8.4 (L) 11/21/2020 0510   HCT 26.9 (L) 11/21/2020 0510   PLT 113 (L) 11/21/2020 0510   BMET    Component Value Date/Time   NA 140 11/21/2020 0510   K 4.9 11/21/2020 0510   CL 108 11/21/2020 0510   CO2 25 11/21/2020 0510   GLUCOSE 133 (H) 11/21/2020 0510   BUN 71 (H) 11/21/2020 0510   CREATININE 3.38 (H) 11/21/2020 0510   CALCIUM 8.6 (L) 11/21/2020 0510   GFRNONAA 14 (L) 11/21/2020 0510   GFRAA 21 (L) 09/02/2018 1731   Assessment/Planning: The patient is a 70 year old female with with multiple medical issues found to have acute CVA in the setting of carotid stenosis  1) Carotid Stenosis: In the setting of a new CVA with carotid disease recommend undergoing a carotid angiogram in an attempt to assess her anatomy and contributing degree of atherosclerotic disease.  Procedure, risks and benefits were explained to the patient.  All questions were answered.  The patient wishes to proceed.  We will plan on this tomorrow with Dr. Delana Meyer.  2) Acute Respiratory Distress: Patient with an episode of respiratory distress yesterday  evening. Possible aspiration pneumonia? Currently being worked up by primary team. Patient was breathing fine this AM without any supplemental oxygen during my visit.  3) Anemia of Chronic Disease: Hemoglobin today 8.4  Discussed with Dr. Eber Hong Autumn Gaughran PA-C 11/21/2020 10:30 AM

## 2020-11-21 NOTE — Progress Notes (Signed)
[facility] Daily Progress Note   Subjective: Patient without complaint this AM.  Patient did experience an acute episode of respiratory distress. Still with aphasia.  Objective: Vitals:   11/21/20 0454 11/21/20 0804 11/21/20 0806 11/21/20 0807  BP:  (!) 169/113    Pulse:  (!) 133  (!) 108  Resp:  19    Temp:  98.3 F (36.8 C)    TempSrc:      SpO2:  100% 99% 100%  Weight: 97.9 kg     Height:        Intake/Output Summary (Last 24 hours) at 11/21/2020 1030 Last data filed at 11/20/2020 1100 Gross per 24 hour  Intake 120 ml  Output 300 ml  Net -180 ml   Physical Exam: A&Ox2, NAD this AM CV: RRR Pulmonary: Decreased bilaterally Abdomen: Soft, Nontender, Nondistended Vascular: Mild edema.  Warm distally to toes.  Motor/sensory is intact.   Laboratory: CBC    Component Value Date/Time   WBC 12.6 (H) 11/21/2020 0510   HGB 8.4 (L) 11/21/2020 0510   HCT 26.9 (L) 11/21/2020 0510   PLT 113 (L) 11/21/2020 0510   BMET    Component Value Date/Time   NA 140 11/21/2020 0510   K 4.9 11/21/2020 0510   CL 108 11/21/2020 0510   CO2 25 11/21/2020 0510   GLUCOSE 133 (H) 11/21/2020 0510   BUN 71 (H) 11/21/2020 0510   CREATININE 3.38 (H) 11/21/2020 0510   CALCIUM 8.6 (L) 11/21/2020 0510   GFRNONAA 14 (L) 11/21/2020 0510   GFRAA 21 (L) 09/02/2018 1731   Assessment/Planning: The patient is a 70 year old female with with multiple medical issues found to have acute CVA in the setting of carotid stenosis  1) Carotid Stenosis: In the setting of a new CVA with carotid disease recommend undergoing a carotid angiogram in an attempt to assess her anatomy and contributing degree of atherosclerotic disease.  Procedure, risks and benefits were explained to the patient.  All questions were answered.  The patient wishes to proceed.  We will plan on this tomorrow with Dr. Delana Meyer.  2) Acute Respiratory Distress: Patient with an episode of respiratory distress yesterday  evening. Possible aspiration pneumonia? Currently being worked up by primary team. Patient was breathing fine this AM without any supplemental oxygen during my visit.  3) Anemia of Chronic Disease: Hemoglobin today 8.4  Discussed with Dr. Eber Hong Jocelin Schuelke PA-C 11/21/2020 10:30 AM

## 2020-11-21 NOTE — Progress Notes (Signed)
Rapid was called due to pt yelling "I can't breathe" and RR of 30-40, 3lpm [place] applied, spo2 92% following [place] application, HR remains in afib unchanged from previous rhythm at 7pm. Pt speech also seemed more garbled but may have been attributed to increased work of breathing. Judd Gaudier and Alver Fisher RRT RN at bedside. ABG obtained, stat EKG, chest xray, and CBG at bedside. MD reviewed these at bedside. Bipap was placed and pt immediately appeared more comfortable and began to rest. RR slowed to about 26, and continues to stabilize.

## 2020-11-22 ENCOUNTER — Encounter
Admission: EM | Disposition: A | Discharge status: Skilled Nursing Facility | Payer: Self-pay | Source: Home / Self Care | Attending: Internal Medicine

## 2020-11-22 DIAGNOSIS — I6522 Occlusion and stenosis of left carotid artery: Secondary | ICD-10-CM

## 2020-11-22 DIAGNOSIS — E1122 Type 2 diabetes mellitus with diabetic chronic kidney disease: Secondary | ICD-10-CM | POA: Diagnosis not present

## 2020-11-22 DIAGNOSIS — I4891 Unspecified atrial fibrillation: Secondary | ICD-10-CM | POA: Diagnosis not present

## 2020-11-22 DIAGNOSIS — L03119 Cellulitis of unspecified part of limb: Secondary | ICD-10-CM | POA: Diagnosis not present

## 2020-11-22 DIAGNOSIS — M86172 Other acute osteomyelitis, left ankle and foot: Secondary | ICD-10-CM | POA: Diagnosis not present

## 2020-11-22 DIAGNOSIS — J9601 Acute respiratory failure with hypoxia: Secondary | ICD-10-CM | POA: Diagnosis not present

## 2020-11-22 HISTORY — PX: CAROTID ANGIOGRAPHY: CATH118230

## 2020-11-22 LAB — SEDIMENTATION RATE: Sed Rate: 77 mm/hr — ABNORMAL HIGH (ref 0–30)

## 2020-11-22 LAB — BASIC METABOLIC PANEL
Anion gap: 9 (ref 5–15)
BUN: 65 mg/dL — ABNORMAL HIGH (ref 8–23)
CO2: 25 mmol/L (ref 22–32)
Calcium: 8.8 mg/dL — ABNORMAL LOW (ref 8.9–10.3)
Chloride: 107 mmol/L (ref 98–111)
Creatinine, Ser: 3.54 mg/dL — ABNORMAL HIGH (ref 0.44–1.00)
GFR, Estimated: 13 mL/min — ABNORMAL LOW (ref >60)
Glucose, Bld: 124 mg/dL — ABNORMAL HIGH (ref 70–99)
Potassium: 5.1 mmol/L (ref 3.5–5.1)
Sodium: 141 mmol/L (ref 135–145)

## 2020-11-22 LAB — CBC
HCT: 27.6 % — ABNORMAL LOW (ref 36.0–46.0)
Hemoglobin: 8.5 g/dL — ABNORMAL LOW (ref 12.0–15.0)
MCH: 29.6 pg (ref 26.0–34.0)
MCHC: 30.8 g/dL (ref 30.0–36.0)
MCV: 96.2 fL (ref 80.0–100.0)
Platelets: 135 10*3/uL — ABNORMAL LOW (ref 150–400)
RBC: 2.87 MIL/uL — ABNORMAL LOW (ref 3.87–5.11)
RDW: 17.6 % — ABNORMAL HIGH (ref 11.5–15.5)
WBC: 11.9 10*3/uL — ABNORMAL HIGH (ref 4.0–10.5)
nRBC: 0 % (ref 0.0–0.2)

## 2020-11-22 LAB — MAGNESIUM: Magnesium: 2 mg/dL (ref 1.7–2.4)

## 2020-11-22 LAB — GLUCOSE, CAPILLARY
Glucose-Capillary: 131 mg/dL — ABNORMAL HIGH (ref 70–99)
Glucose-Capillary: 123 mg/dL — ABNORMAL HIGH (ref 70–99)
Glucose-Capillary: 139 mg/dL — ABNORMAL HIGH (ref 70–99)
Glucose-Capillary: 140 mg/dL — ABNORMAL HIGH (ref 70–99)

## 2020-11-22 LAB — PROTIME-INR
INR: 1.3 — ABNORMAL HIGH (ref 0.8–1.2)
Prothrombin Time: 15.9 seconds — ABNORMAL HIGH (ref 11.4–15.2)

## 2020-11-22 LAB — C-REACTIVE PROTEIN: CRP: 0.7 mg/dL (ref <1.0)

## 2020-11-22 LAB — APTT: aPTT: 33 seconds (ref 24–36)

## 2020-11-22 SURGERY — CAROTID ANGIOGRAPHY
Anesthesia: Moderate Sedation | Laterality: Left

## 2020-11-22 MED ORDER — FENTANYL CITRATE PF 50 MCG/ML IJ SOSY
PREFILLED_SYRINGE | INTRAMUSCULAR | Status: AC
  Filled 2020-11-22: qty 1

## 2020-11-22 MED ORDER — METOLAZONE 2.5 MG PO TABS
2.5000 mg | ORAL_TABLET | Freq: Once | ORAL | Status: AC
  Administered 2020-11-22: 2.5 mg via ORAL
  Filled 2020-11-22: qty 1

## 2020-11-22 MED ORDER — HYDRALAZINE HCL 20 MG/ML IJ SOLN
INTRAMUSCULAR | Status: AC
  Filled 2020-11-22: qty 1

## 2020-11-22 MED ORDER — ALBUTEROL SULFATE (2.5 MG/3ML) 0.083% IN NEBU
INHALATION_SOLUTION | RESPIRATORY_TRACT | Status: AC
  Administered 2020-11-22: 2.5 mg via RESPIRATORY_TRACT
  Filled 2020-11-22: qty 3

## 2020-11-22 MED ORDER — POTASSIUM CHLORIDE CRYS ER 20 MEQ PO TBCR
40.0000 meq | EXTENDED_RELEASE_TABLET | Freq: Once | ORAL | Status: AC
  Administered 2020-11-22: 40 meq via ORAL

## 2020-11-22 MED ORDER — SODIUM CHLORIDE 0.9% FLUSH: 3.0000 mL | INTRAVENOUS | Status: DC | PRN

## 2020-11-22 MED ORDER — CEFAZOLIN SODIUM-DEXTROSE 1-4 GM/50ML-% IV SOLN
1.0000 g | Freq: Once | INTRAVENOUS | Status: AC
  Administered 2020-11-22: 1 g via INTRAVENOUS

## 2020-11-22 MED ORDER — ONDANSETRON HCL 4 MG/2ML IJ SOLN
4.0000 mg | Freq: Four times a day (QID) | INTRAMUSCULAR | Status: DC | PRN
  Filled 2020-11-22 (×3): qty 2

## 2020-11-22 MED ORDER — MIDAZOLAM HCL 2 MG/ML PO SYRP: 8.0000 mg | ORAL_SOLUTION | Freq: Once | ORAL | Status: DC | PRN

## 2020-11-22 MED ORDER — FENTANYL CITRATE (PF) 100 MCG/2ML IJ SOLN
INTRAMUSCULAR | Status: DC | PRN
  Administered 2020-11-22: 25 ug via INTRAVENOUS

## 2020-11-22 MED ORDER — HYDROMORPHONE HCL 1 MG/ML IJ SOLN: 1.0000 mg | Freq: Once | INTRAMUSCULAR | Status: DC | PRN

## 2020-11-22 MED ORDER — MIDAZOLAM HCL 2 MG/2ML IJ SOLN
INTRAMUSCULAR | Status: DC | PRN
  Administered 2020-11-22 (×2): .5 mg via INTRAVENOUS

## 2020-11-22 MED ORDER — FAMOTIDINE 20 MG PO TABS: 40.0000 mg | ORAL_TABLET | Freq: Once | ORAL | Status: DC | PRN

## 2020-11-22 MED ORDER — FUROSEMIDE 10 MG/ML IJ SOLN: 20.0000 mg | INTRAMUSCULAR | Status: AC

## 2020-11-22 MED ORDER — HEPARIN SODIUM (PORCINE) 1000 UNIT/ML IJ SOLN
INTRAMUSCULAR | Status: AC
  Filled 2020-11-22: qty 1

## 2020-11-22 MED ORDER — APIXABAN 5 MG PO TABS
5.0000 mg | ORAL_TABLET | Freq: Two times a day (BID) | ORAL | Status: DC
  Administered 2020-11-22 – 2020-11-26 (×8): 5 mg via ORAL
  Filled 2020-11-22 (×9): qty 1

## 2020-11-22 MED ORDER — SODIUM CHLORIDE 0.9% FLUSH
3.0000 mL | Freq: Two times a day (BID) | INTRAVENOUS | Status: DC
  Administered 2020-11-22 – 2020-12-13 (×36): 3 mL via INTRAVENOUS

## 2020-11-22 MED ORDER — METOPROLOL TARTRATE 50 MG PO TABS
50.0000 mg | ORAL_TABLET | Freq: Four times a day (QID) | ORAL | Status: DC
  Administered 2020-11-22 – 2020-11-23 (×3): 50 mg via ORAL
  Filled 2020-11-22 (×3): qty 1

## 2020-11-22 MED ORDER — DIPHENHYDRAMINE HCL 50 MG/ML IJ SOLN
INTRAMUSCULAR | Status: AC
  Filled 2020-11-22: qty 1

## 2020-11-22 MED ORDER — SODIUM CHLORIDE 0.9 % IV SOLN: INTRAVENOUS | Status: DC

## 2020-11-22 MED ORDER — DIPHENHYDRAMINE HCL 50 MG/ML IJ SOLN
INTRAMUSCULAR | Status: DC | PRN
  Administered 2020-11-22: .625 mg via INTRAVENOUS

## 2020-11-22 MED ORDER — HEPARIN SODIUM (PORCINE) 1000 UNIT/ML IJ SOLN
INTRAMUSCULAR | Status: DC | PRN
  Administered 2020-11-22: 4000 [IU] via INTRAVENOUS

## 2020-11-22 MED ORDER — METHYLPREDNISOLONE SODIUM SUCC 125 MG IJ SOLR: 125.0000 mg | Freq: Once | INTRAMUSCULAR | Status: DC | PRN

## 2020-11-22 MED ORDER — MIDAZOLAM HCL 5 MG/5ML IJ SOLN
INTRAMUSCULAR | Status: AC
  Filled 2020-11-22: qty 5

## 2020-11-22 MED ORDER — SODIUM CHLORIDE 0.9 % IV SOLN
250.0000 mL | INTRAVENOUS | Status: DC | PRN
  Administered 2020-11-24 – 2020-11-25 (×3): 250 mL via INTRAVENOUS

## 2020-11-22 MED ORDER — FUROSEMIDE 10 MG/ML IJ SOLN
20.0000 mg | INTRAMUSCULAR | Status: AC
  Administered 2020-11-22: 20 mg via INTRAVENOUS
  Filled 2020-11-22: qty 2

## 2020-11-22 MED ORDER — FUROSEMIDE 10 MG/ML IJ SOLN
INTRAMUSCULAR | Status: AC
  Administered 2020-11-22: 20 mg via INTRAVENOUS
  Filled 2020-11-22: qty 2

## 2020-11-22 MED ORDER — DIPHENHYDRAMINE HCL 50 MG/ML IJ SOLN: 50.0000 mg | Freq: Once | INTRAMUSCULAR | Status: DC | PRN

## 2020-11-22 MED ORDER — DILTIAZEM HCL 30 MG PO TABS
90.0000 mg | ORAL_TABLET | Freq: Four times a day (QID) | ORAL | Status: AC
  Administered 2020-11-22 – 2020-11-23 (×5): 90 mg via ORAL
  Filled 2020-11-22 (×5): qty 3

## 2020-11-22 MED ORDER — DILTIAZEM HCL 25 MG/5ML IV SOLN
10.0000 mg | Freq: Once | INTRAVENOUS | Status: DC
  Filled 2020-11-22: qty 5

## 2020-11-22 SURGICAL SUPPLY — 16 items
CATH ANGIO 5F PIGTAIL 100CM (CATHETERS) ×2 IMPLANT
CATH BEACON 5 .035 100 H1 TIP (CATHETERS) ×2 IMPLANT
CATH INFINITI JR4 5F (CATHETERS) ×2 IMPLANT
CATH SIM1 5FR 125CM (CATHETERS) ×2 IMPLANT
CATH VERT 5FR 125CM (CATHETERS) ×2 IMPLANT
COVER PROBE U/S 5X48 (MISCELLANEOUS) ×2 IMPLANT
DEVICE STARCLOSE SE CLOSURE (Vascular Products) ×2 IMPLANT
DEVICE TORQUE .025-.038 (MISCELLANEOUS) ×2 IMPLANT
GLIDEWIRE ANGLED SS 035X260CM (WIRE) ×2 IMPLANT
NEEDLE ENTRY 21GA 7CM ECHOTIP (NEEDLE) ×2 IMPLANT
PACK ANGIOGRAPHY (CUSTOM PROCEDURE TRAY) ×2 IMPLANT
SET INTRO CAPELLA COAXIAL (SET/KITS/TRAYS/PACK) ×2 IMPLANT
SHEATH BRITE TIP 5FRX11 (SHEATH) ×2 IMPLANT
SYR MEDRAD MARK 7 150ML (SYRINGE) ×2 IMPLANT
TUBING CONTRAST HIGH PRESS 72 (TUBING) ×2 IMPLANT
WIRE GUIDERIGHT .035X150 (WIRE) ×2 IMPLANT

## 2020-11-22 NOTE — Progress Notes (Signed)
Neurology progress note  S: Patient had an episode of AMS yesterday which improved after bipap. She remains on bipap today but when she removes the mask she is fully oriented and actually has had continued improvement in her dysarthria. Head CT wo contrast today unchanged. Plan for arteriogram tomorrow with vascular surgery  O:  Vitals:   11/22/20 0030 11/22/20 0305  BP: (!) 158/99 (!) 142/102  Pulse: (!) 109 (!) 107  Resp: 17   Temp: 97.8 F (36.6 C) 98 F (36.7 C)  SpO2: 100% 99%   Gen: lying in bed, NAD Resp: CTAB CV: irregularly irregular  Mental status: alert and oriented x4, follows simple commands Speech: severe dysarthria, no aphasia CN: PERRL, blinks to threat bilat, EOMI, sensation intact, R UMN facial droop, hearing intact to voice Motor: R sided weakness 4/5 diffusely, LUE full strength, LLE examination limited 2/2 wound Sensory: SILT Reflexes: 1+ and symmetric throughout  A/P: - Acute left precentral gyrus stroke with right sided weakness. Stroke most likely peri-procedural versus due to her atrial fibrillation this admission. Heparin gtt stopped for acute blood loss anemia. Head CT ordered to r/o interval hemorrhagic conversion prior to anticoagulation restart was negative for acute blood and unchanged from prior.   - OK with neurology to restart heparin gtt for a fib when safe to do so from blood loss standpoint and when ok with vascular surgery - F/u arteriogram results tomorrow  Will continue to follow.  Su Monks, MD [facility] [phone]  If 7pm- 7am, please page neurology on call as listed in [place].

## 2020-11-22 NOTE — Progress Notes (Signed)
Date of Admission:  11/09/2020     ID: Autumn [name] is a 70 y.o. female  Principal Problem:   Sepsis ([place]) Active Problems:   Atrial fibrillation with RVR ([place])   Osteomyelitis of foot, left, acute ([place])   CKD stage 4 due to type 2 diabetes mellitus ([place])   Liver cirrhosis secondary to NASH (nonalcoholic steatohepatitis) ([place])   Hypertension   History of anemia due to CKD   Frequent falls   AKI (acute kidney injury) ([place])   Cellulitis and abscess of foot, except toes   Infectious tenosynovitis   [place]   Atrial fibrillation with rapid ventricular response (H[facility])   Bilateral carotid artery stenosis    Subjective: Pt went for carotid angio today Breathing better  Awake     Medications:   [MAR Hold] sodium chloride   Intravenous Once   [MAR Hold] allopurinol  200 mg Per Tube Daily   [MAR Hold] vitamin C  500 mg Per Tube BID   [MAR Hold] budesonide (PULMICORT) nebulizer solution  0.5 mg Nebulization BID   [MAR Hold] Chlorhexidine Gluconate Cloth  6 each Topical Daily   [MAR Hold] diltiazem  60 mg Per Tube Q6H   [MAR Hold] docusate  100 mg Per Tube BID   [MAR Hold] feeding supplement  237 mL Oral TID BM   fentaNYL       [MAR Hold] ferrous gluconate  324 mg Oral Q breakfast   heparin sodium (porcine)       heparin sodium (porcine)       [MAR Hold] insulin aspart  0-6 Units Subcutaneous TID WC   [MAR Hold] levothyroxine  100 mcg Per Tube Q0600   [MAR Hold] mouth rinse  15 mL Mouth Rinse BID   [MAR Hold] metoprolol tartrate  50 mg Per Tube Q6H   midazolam       [MAR Hold] multivitamin with minerals  1 tablet Oral Daily   [MAR Hold] pantoprazole (PROTONIX) IV  40 mg Intravenous Daily   [MAR Hold] polyethylene glycol  17 g Per Tube Daily   [MAR Hold] sodium chloride flush  3 mL Intravenous Q12H   [MAR Hold] venlafaxine  100 mg Per Tube BID    Objective: Vital signs in last 24 hours: Temp:  [97.4 F (36.3 C)-98 F (36.7 C)] 97.5 F (36.4 C) (11/11 0748) Pulse  Rate:  [72-140] 78 (11/11 0748) Resp:  [17-20] 20 (11/11 1033) BP: (137-178)/(89-125) 178/96 (11/11 1033) SpO2:  [91 %-100 %] 92 % (11/11 1033) FiO2 (%):  [30 %] 30 % (11/10 2044)  PHYSICAL EXAM:  General: awake,no  resp distress Aphasia better Rt hemiparesis  Lungs: b/l air entry- Heart: irregular. Chest b/l rhonchi Abdomen: Soft, non-tender,not distended. Bowel sounds normal. No masses Extremities: left foot dressing not removed Skin: No rashes or lesions. Or bruising Lymph: Cervical, supraclavicular normal. Neurologic: rt hemiparesis  Lab Results Recent Labs    11/21/20 0510 11/22/20 0709  WBC 12.6* 11.9*  HGB 8.4* 8.5*  HCT 26.9* 27.6*  NA 140 141  K 4.9 5.1  CL 108 107  CO2 25 25  BUN 71* 65*  CREATININE 3.38* 3.54*   Liver Panel Recent Labs    11/20/20 0504  PROT 5.5*  ALBUMIN 2.3*  AST 20  ALT 16  ALKPHOS 58  BILITOT 0.6   [place] Microbiology: MRSA nares  not detected 11/13/20 BC Foot abscess culture Pasteurella, rare staph and strep mitis, peptostreptococcus, bacteroides Studies/Results: CT HEAD WO CONTRAST (5MM)  Result  Date: 11/21/2020 CLINICAL DATA:  Altered mental status EXAM: CT HEAD WITHOUT CONTRAST TECHNIQUE: Contiguous axial images were obtained from the base of the skull through the vertex without intravenous contrast. COMPARISON:  Previous studies including the examination of 11/19/2020 FINDINGS: Brain: There are no signs of bleeding within the cranium. There is small area of encephalomalacia in the left frontal cortex suggests recent infarct. There is no focal mass effect. Cortical sulci are prominent. There is decreased density in periventricular and subcortical white matter. Vascular: Unremarkable. Skull: Unremarkable. Sinuses/Orbits: Unremarkable. Other: None IMPRESSION: There are no signs of bleeding within the cranium. No significant changes are noted in recent infarct in the left frontal cortex. Atrophy. Small-vessel disease. Electronically  Signed   By: Elmer Picker M.D.   On: 11/21/2020 09:40   DG Chest Port 1 View  Result Date: 11/21/2020 CLINICAL DATA:  Acute hypoxic respiratory failure EXAM: PORTABLE CHEST 1 VIEW COMPARISON:  11/15/2020 FINDINGS: Cardiomegaly with mild interstitial edema. Mild right infrahilar opacity, favoring edema, although superimposed right lower lobe pneumonia is not excluded. Possible small left pleural effusion. No pneumothorax. IMPRESSION: Cardiomegaly with mild interstitial edema and possible small left pleural effusion. Mild right infrahilar opacity, favoring edema, superimposed right lower lobe pneumonia not excluded. Electronically Signed   By: Julian Hy M.D.   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 Carotid stenosis - underwent angio- no significant stenosis  Diabetes mellitus with Left foot ulcer with infection and sepsis  underwent I/D and 2nd ray excision polymicrobial Culture MSSA, pasteurella, strep mitis The surgical wound looks heathy  On unasyn As pathology shows osteo at the bone margin will give IV unasyn for 4 weeks until 12/09/20 -followed by PO augmentin until 12/23/20 Will follow her as OP  Left foot surgical site bleeding secondary to heparin IV which has been discontinued     Afib-RVR on diltiazem and metoprolol-followed by cardiology Severe anemia- got blood transfusion  AKI on CKD- worsening Avoid nephrotoxic drugs   DM    NASH leading to cirrhosis Rt heart failure   Hypothyroidism on synthroid-     Discussed the management with  care team  ID will sign of - call if needed

## 2020-11-22 NOTE — Progress Notes (Signed)
[facility]  ROUNDING NOTE   Subjective:   Patient in good spirits this AM. Creatinine currently 3.54 with an EGFR of 13.  Objective:  Vital signs in last 24 hours:  Temp:  [97.4 F (36.3 C)-98 F (36.7 C)] 97.5 F (36.4 C) (11/11 0748) Pulse Rate:  [72-140] 78 (11/11 0748) Resp:  [17-20] 20 (11/11 1033) BP: (137-178)/(89-125) 178/96 (11/11 1033) SpO2:  [91 %-100 %] 92 % (11/11 1033) FiO2 (%):  [30 %] 30 % (11/10 2044)  Weight change:  Filed Weights   11/19/20 0412 11/20/20 0338 11/21/20 0454  Weight: 95.6 kg 95.8 kg 97.9 kg    Intake/Output: I/O last 3 completed shifts: In: 240 [P.O.:240] Out: 600 [Urine:600]   Intake/Output this shift:  No intake/output data recorded.  Physical Exam: General: NAD  Head: Atraumatic. Moist oral mucosal membranes  Eyes: Anicteric  Lungs:  Clear bilaterally, normal effort  Heart: Irregular rhythm  Abdomen:  Soft, nontender  Extremities: No peripheral edema.  Surgical dressing on left foot  Neurologic: Alert, able to answer simple questions  Skin: Warm        Basic Metabolic Panel: Recent Labs  Lab 11/16/20 0246 11/17/20 0815 11/18/20 0227 11/19/20 0543 11/20/20 0504 11/21/20 0510 11/22/20 0709  NA 139 139 143 141 142 140 141  K 3.9 4.1 4.4 4.6 5.0 4.9 5.1  CL 101 102 106 107 107 108 107  CO2 _0 GLUCOSE 166* 175* 194* 136* 132* 133* 124*  BUN 99* 97* 95* 79* 81* 71* 65*  CREATININE 4.79* 4.24* 3.89* 3.36* 3.32* 3.38* 3.54*  CALCIUM 8.2* 8.5* 8.5* 8.8* 9.0 8.6* 8.8*  MG 2.4 2.1 2.0 2.1 2.1 1.9 2.0  PHOS 5.9* 4.9* 3.8 3.8 4.0  --   --      Liver Function Tests: Recent Labs  Lab 11/16/20 0246 11/17/20 0815 11/18/20 0227 11/19/20 0543 11/20/20 0504  AST _1 ALT _2 ALKPHOS 59 66 54 57 58  BILITOT 0.9 0.7 0.8 0.8 0.6  PROT 5.6* 5.8* 5.0* 4.9* 5.5*  ALBUMIN 2.1* 2.3* 2.0* 2.1* 2.3*    No results for input(s): LIPASE, AMYLASE in the last 168 hours. No  results for input(s): AMMONIA in the last 168 hours.  CBC: Recent Labs  Lab 11/16/20 0246 11/17/20 0815 11/18/20 0227 11/18/20 1237 11/18/20 2038 11/19/20 0543 11/20/20 0504 11/21/20 0510 11/22/20 0709  WBC 10.8* 9.3 10.6*  --   --  12.5* 10.7* 12.6* 11.9*  NEUTROABS 8.5* 6.9 8.2*  --   --  9.6* 7.9*  --   --   HGB 8.0* 8.2* 6.4*   < > 9.0* 8.5* 8.4* 8.4* 8.5*  HCT 25.2* 26.2* 20.5*   < > 27.4* 26.6* 26.5* 26.9* 27.6*  MCV 86.3 88.8 89.1  --   --  91.4 93.3 94.7 96.2  PLT 168 134* 124*  --   --  104* 108* 113* 135*   < > = values in this interval not displayed.     Cardiac Enzymes: No results for input(s): CKTOTAL, CKMB, CKMBINDEX, TROPONINI in the last 168 hours.   BNP: Invalid input(s): POCBNP  CBG: Recent Labs  Lab 11/21/20 1204 11/21/20 1627 11/21/20 2023 11/22/20 0818 11/22/20 1040  GLUCAP 162* 142* 196* 131* 123*     Microbiology: Results for orders placed or performed during the hospital encounter of 11/09/20  Resp Panel by RT-PCR (Flu A&B, Covid) Nasopharyngeal Swab  Status: None   Collection Time: 11/09/20  6:25 PM   Specimen: Nasopharyngeal Swab; Nasopharyngeal(NP) swabs in vial transport medium  Result Value Ref Range Status   SARS Coronavirus 2 by RT PCR NEGATIVE NEGATIVE Final    Comment: (NOTE) SARS-CoV-2 target nucleic acids are NOT DETECTED.  The SARS-CoV-2 RNA is generally detectable in upper respiratory specimens during the acute phase of infection. The lowest concentration of SARS-CoV-2 viral copies this assay can detect is 138 copies/mL. A negative result does not preclude SARS-Cov-2 infection and should not be used as the sole basis for treatment or other patient management decisions. A negative result may occur with  improper specimen collection/handling, submission of specimen other than nasopharyngeal swab, presence of viral mutation(s) within the areas targeted by this assay, and inadequate number of viral copies(<138  copies/mL). A negative result must be combined with clinical observations, patient history, and epidemiological information. The expected result is Negative.  Fact Sheet for Patients:  EntrepreneurPulse.com.au  Fact Sheet for Healthcare Providers:  IncredibleEmployment.be  This test is no t yet approved or cleared by the [place] FDA and  has been authorized for detection and/or diagnosis of SARS-CoV-2 by FDA under an Emergency Use Authorization (EUA). This EUA will remain  in effect (meaning this test can be used) for the duration of the COVID-19 declaration under Section 564(b)(1) of the Act, 21 U.S.C.section 360bbb-3(b)(1), unless the authorization is terminated  or revoked sooner.       Influenza A by PCR NEGATIVE NEGATIVE Final   Influenza B by PCR NEGATIVE NEGATIVE Final    Comment: (NOTE) The Xpert Xpress SARS-CoV-2/FLU/RSV plus assay is intended as an aid in the diagnosis of influenza from Nasopharyngeal swab specimens and should not be used as a sole basis for treatment. Nasal washings and aspirates are unacceptable for Xpert Xpress SARS-CoV-2/FLU/RSV testing.  Fact Sheet for Patients: EntrepreneurPulse.com.au  Fact Sheet for Healthcare Providers: IncredibleEmployment.be  This test is not yet approved or cleared by the [place] FDA and has been authorized for detection and/or diagnosis of SARS-CoV-2 by FDA under an Emergency Use Authorization (EUA). This EUA will remain in effect (meaning this test can be used) for the duration of the COVID-19 declaration under Section 564(b)(1) of the Act, 21 U.S.C. section 360bbb-3(b)(1), unless the authorization is terminated or revoked.  Performed at [facility], [address] [place], [place] [address]   MRSA Next Gen by PCR, Nasal     Status: None   Collection Time: 11/10/20  1:14 AM   Specimen: Nasal Mucosa; Nasal Swab  Result  Value Ref Range Status   MRSA by PCR Next Gen NOT DETECTED NOT DETECTED Final    Comment: (NOTE) The GeneXpert MRSA Assay (FDA approved for NASAL specimens only), is one component of a comprehensive MRSA colonization surveillance program. It is not intended to diagnose MRSA infection nor to guide or monitor treatment for MRSA infections. Test performance is not FDA approved in patients less than 21 years old. Performed at [facility], [address] [place], [place] [address]   Aerobic/Anaerobic Culture w Gram Stain (surgical/deep wound)     Status: None   Collection Time: 11/11/20  3:23 PM   Specimen: Foot, Left; Abscess  Result Value Ref Range Status   Specimen Description   Final    WOUND LEFT FOOT ABSCESS Performed at [facility], [address] [place], [place] [address]    Special Requests   Final    NONE Performed at  [place], [place] [address]    Gram Stain   Final    NO ORGANISMS SEEN SQUAMOUS EPITHELIAL CELLS PRESENT MODERATE WBC PRESENT, PREDOMINANTLY MONONUCLEAR MODERATE GRAM POSITIVE COCCI Performed at [facility], [place] [address], [place], [place] [address]    Culture   Final    RARE STAPHYLOCOCCUS AUREUS RARE PASTEURELLA MULTOCIDA Usually susceptible to penicillin and other beta lactam agents,quinolones,macrolides and tetracyclines. MIXED ANAEROBIC FLORA PRESENT.  CALL LAB IF FURTHER IID REQUIRED.    Report Status 11/17/2020 FINAL  Final   Organism ID, Bacteria STAPHYLOCOCCUS AUREUS  Final      Susceptibility   Staphylococcus aureus - MIC*    CIPROFLOXACIN <=0.5 SENSITIVE Sensitive     ERYTHROMYCIN <=0.25 SENSITIVE Sensitive     GENTAMICIN <=0.5 SENSITIVE Sensitive     OXACILLIN <=0.25 SENSITIVE Sensitive     TETRACYCLINE <=1 SENSITIVE Sensitive     VANCOMYCIN <=0.5 SENSITIVE Sensitive     TRIMETH/SULFA <=10 SENSITIVE Sensitive     CLINDAMYCIN <=0.25 SENSITIVE Sensitive     RIFAMPIN  <=0.5 SENSITIVE Sensitive     Inducible Clindamycin NEGATIVE Sensitive     * RARE STAPHYLOCOCCUS AUREUS  Aerobic/Anaerobic Culture w Gram Stain (surgical/deep wound)     Status: None   Collection Time: 11/11/20  3:32 PM   Specimen: Other Source; Tissue  Result Value Ref Range Status   Specimen Description   Final    WOUND 2ND METATARSAL Performed at [facility], [address], [place], [place] [address]    Special Requests   Final    NONE Performed at [facility], [place], [place] [address]    Gram Stain   Final    NO ORGANISMS SEEN SQUAMOUS EPITHELIAL CELLS PRESENT FEW WBC PRESENT, PREDOMINANTLY MONONUCLEAR FEW GRAM POSITIVE COCCI Performed at [facility], [place] [address] [place], [place] [address]    Culture   Final    RARE STREPTOCOCCUS MITIS/ORALIS RARE PASTEURELLA MULTOCIDA Usually susceptible to penicillin and other beta lactam agents,quinolones,macrolides and tetracyclines. ABUNDANT BACTEROIDES SPECIES BETA LACTAMASE POSITIVE RARE STAPHYLOCOCCUS AUREUS    Report Status 11/17/2020 FINAL  Final   Organism ID, Bacteria STREPTOCOCCUS MITIS/ORALIS  Final   Organism ID, Bacteria STAPHYLOCOCCUS AUREUS  Final      Susceptibility   Staphylococcus aureus - MIC*    CIPROFLOXACIN <=0.5 SENSITIVE Sensitive     ERYTHROMYCIN <=0.25 SENSITIVE Sensitive     GENTAMICIN <=0.5 SENSITIVE Sensitive     OXACILLIN <=0.25 SENSITIVE Sensitive     TETRACYCLINE <=1 SENSITIVE Sensitive     VANCOMYCIN <=0.5 SENSITIVE Sensitive     TRIMETH/SULFA <=10 SENSITIVE Sensitive     CLINDAMYCIN <=0.25 SENSITIVE Sensitive     RIFAMPIN <=0.5 SENSITIVE Sensitive     Inducible Clindamycin NEGATIVE Sensitive     * RARE STAPHYLOCOCCUS AUREUS   Streptococcus mitis/oralis - MIC*    TETRACYCLINE 0.5 SENSITIVE Sensitive     VANCOMYCIN 0.5 SENSITIVE Sensitive     CLINDAMYCIN <=0.25 SENSITIVE Sensitive     * RARE STREPTOCOCCUS MITIS/ORALIS  Aerobic/Anaerobic Culture  w Gram Stain (surgical/deep wound)     Status: None   Collection Time: 11/11/20  4:26 PM   Specimen: Other Source; Tissue  Result Value Ref Range Status   Specimen Description   Final    WOUND LEFT METATARSAL Performed at [facility], [address] [place], [place] [address]    Special Requests   Final    NONE  Performed at [facility], [place], [place] [address]    Gram Stain NO WBC SEEN NO ORGANISMS SEEN   Final   Culture   Final    RARE BACTEROIDES SPECIES BETA LACTAMASE POSITIVE RARE PEPTOSTREPTOCOCCUS MICROS Standardized susceptibility testing for this organism is not available. Performed at [facility], [place] [address] [place], [place] [address]    Report Status 11/17/2020 FINAL  Final  MRSA Next Gen by PCR, Nasal     Status: None   Collection Time: 11/13/20 11:22 AM   Specimen: Nasal Mucosa; Nasal Swab  Result Value Ref Range Status   MRSA by PCR Next Gen NOT DETECTED NOT DETECTED Final    Comment: (NOTE) The GeneXpert MRSA Assay (FDA approved for NASAL specimens only), is one component of a comprehensive MRSA colonization surveillance program. It is not intended to diagnose MRSA infection nor to guide or monitor treatment for MRSA infections. Test performance is not FDA approved in patients less than 51 years old. Performed at [facility], [address] [place], [place] [address]   CULTURE, BLOOD (ROUTINE X 2) w Reflex to ID Panel     Status: None   Collection Time: 11/13/20 12:44 PM   Specimen: BLOOD  Result Value Ref Range Status   Specimen Description BLOOD LEFT ANTECUBITAL  Final   Special Requests   Final    BOTTLES DRAWN AEROBIC AND ANAEROBIC Blood Culture adequate volume   Culture   Final    NO GROWTH 5 DAYS Performed at [facility], [address] [place], [place] [address]    Report Status 11/18/2020 FINAL  Final  CULTURE, BLOOD (ROUTINE X 2) w Reflex to ID Panel     Status: None    Collection Time: 11/13/20 12:52 PM   Specimen: BLOOD  Result Value Ref Range Status   Specimen Description BLOOD BLOOD LEFT HAND  Final   Special Requests   Final    BOTTLES DRAWN AEROBIC ONLY Blood Culture results may not be optimal due to an inadequate volume of blood received in culture bottles   Culture   Final    NO GROWTH 5 DAYS Performed at [facility], [address], [place], [place] [address]    Report Status 11/18/2020 FINAL  Final  Culture, Respiratory w Gram Stain     Status: None   Collection Time: 11/13/20  3:43 PM   Specimen: Tracheal Aspirate; Respiratory  Result Value Ref Range Status   Specimen Description   Final    TRACHEAL ASPIRATE Performed at [facility], [address] [place], [place] [address]    Special Requests   Final    NONE Performed at [facility], [place], [place] [address]    Gram Stain   Final    NO SQUAMOUS EPITHELIAL CELLS SEEN FEW WBC SEEN NO ORGANISMS SEEN Performed at [facility], [place] [address] [place], [place] [address]    Culture RARE CANDIDA ALBICANS  Final   Report Status 11/16/2020 FINAL  Final    Coagulation Studies: Recent Labs    11/22/20 0709  LABPROT 15.9*  INR 1.3*     Urinalysis: No results for input(s): COLORURINE, LABSPEC, PHURINE, GLUCOSEU, HGBUR, BILIRUBINUR, KETONESUR, PROTEINUR, UROBILINOGEN, NITRITE, LEUKOCYTESUR in the last 72 hours.  Invalid input(s): APPERANCEUR     Imaging: CT HEAD WO CONTRAST (5MM)  Result Date: 11/21/2020 CLINICAL DATA:  Altered mental status EXAM: CT HEAD WITHOUT CONTRAST TECHNIQUE:  Contiguous axial images were obtained from the base of the skull through the vertex without intravenous contrast. COMPARISON:  Previous studies including the examination of 11/19/2020 FINDINGS: Brain: There are no signs of bleeding within the cranium. There is small area of encephalomalacia in the left frontal cortex suggests recent infarct.  There is no focal mass effect. Cortical sulci are prominent. There is decreased density in periventricular and subcortical white matter. Vascular: Unremarkable. Skull: Unremarkable. Sinuses/Orbits: Unremarkable. Other: None IMPRESSION: There are no signs of bleeding within the cranium. No significant changes are noted in recent infarct in the left frontal cortex. Atrophy. Small-vessel disease. Electronically Signed   By: Elmer Picker M.D.   On: 11/21/2020 09:40   DG Chest Port 1 View  Result Date: 11/21/2020 CLINICAL DATA:  Acute hypoxic respiratory failure EXAM: PORTABLE CHEST 1 VIEW COMPARISON:  11/15/2020 FINDINGS: Cardiomegaly with mild interstitial edema. Mild right infrahilar opacity, favoring edema, although superimposed right lower lobe pneumonia is not excluded. Possible small left pleural effusion. No pneumothorax. IMPRESSION: Cardiomegaly with mild interstitial edema and possible small left pleural effusion. Mild right infrahilar opacity, favoring edema, superimposed right lower lobe pneumonia not excluded. Electronically Signed   By: Julian Hy M.D.   On: 11/21/2020 03:16     Medications:    [MAR Hold] sodium chloride 5 mL/hr at 11/18/20 0700   [MAR Hold] sodium chloride     sodium chloride     [MAR Hold] ampicillin-sulbactam (UNASYN) IV 3 g (11/21/20 2203)   [START ON 11/23/2020]  ceFAZolin (ANCEF) IV      [MAR Hold] sodium chloride   Intravenous Once   [MAR Hold] allopurinol  200 mg Per Tube Daily   [MAR Hold] vitamin C  500 mg Per Tube BID   [MAR Hold] budesonide (PULMICORT) nebulizer solution  0.5 mg Nebulization BID   [MAR Hold] Chlorhexidine Gluconate Cloth  6 each Topical Daily   [MAR Hold] diltiazem  60 mg Per Tube Q6H   [MAR Hold] docusate  100 mg Per Tube BID   [MAR Hold] feeding supplement  237 mL Oral TID BM   [MAR Hold] ferrous gluconate  324 mg Oral Q breakfast   [MAR Hold] insulin aspart  0-6 Units Subcutaneous TID WC   [MAR Hold] levothyroxine  100  mcg Per Tube Q0600   [MAR Hold] mouth rinse  15 mL Mouth Rinse BID   [MAR Hold] metoprolol tartrate  50 mg Per Tube Q6H   [MAR Hold] multivitamin with minerals  1 tablet Oral Daily   [MAR Hold] pantoprazole (PROTONIX) IV  40 mg Intravenous Daily   [MAR Hold] polyethylene glycol  17 g Per Tube Daily   [MAR Hold] sodium chloride flush  3 mL Intravenous Q12H   [MAR Hold] venlafaxine  100 mg Per Tube BID   [MAR Hold] sodium chloride, [MAR Hold] sodium chloride, [DISCONTINUED] acetaminophen **OR** [MAR Hold] acetaminophen, [MAR Hold] acetaminophen, [MAR Hold] albuterol, [MAR Hold] diltiazem, diphenhydrAMINE, famotidine, [MAR Hold] hydrALAZINE, HYDROmorphone (DILAUDID) injection, [MAR Hold] meclizine, methylPREDNISolone (SOLU-MEDROL) injection, midazolam, [MAR Hold] ondansetron **OR** [MAR Hold] ondansetron (ZOFRAN) IV, ondansetron (ZOFRAN) IV, [MAR Hold] sodium chloride flush  Assessment/ Plan:  Ms. Autumn [name] is a 70 y.o.  female with medical problems of   DM, HTN   was admitted on 11/09/2020 fof Atrial fibrillation with rapid ventricular response ([place]) [I48.91] AKI (acute kidney injury) ([place]) [N17.9] Osteomyelitis of foot, left, acute ([place]) [N98.921] Atrial fibrillation with RVR ([place]) [I48.91] Worsening renal function [N28.9] Syncope, unspecified syncope type [R55]   #  AKI on CKD st4 Baseline Creatinine 3.2/GFR 15 from sep 2022 CKD risk factors include - DM-2, HTN,  Home meds include lisinopril, simvastatin  Renal function close to baseline.  No immediate need for dialysis however suspect that she may end up requiring it in the relative near future if kidney function continues to deteriorate.  Avoid nephrotoxins as possible.    #Diabetes type 2 With CKD Lisinopril held due to current illness.  Glycemic control as per hospitalist.    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     #Anemia of CKD Hemoglobin stable at 8.4. Iron supplement daily.   Consider Epogen as an outpatient.   LOS: 13 Praise Dolecki 11/11/202211:10 AM

## 2020-11-22 NOTE — Progress Notes (Signed)
PT Cancellation Note  Patient Details Name: Autumn [name] MRN: [id number] DOB: [birth year]   Cancelled Treatment:    Reason Eval/Treat Not Completed: Patient at procedure or test/unavailable. PT to re-attempt as able.  Lieutenant Diego PT, DPT [phone] PM,11/22/20

## 2020-11-22 NOTE — Progress Notes (Signed)
   11/22/20 1500  Assess: MEWS Score  Temp 97.6 F (36.4 C)  BP (!) 150/110  Pulse Rate (!) 119  ECG Heart Rate (!) 119  Resp 19  SpO2 98 %  O2 Device Nasal Cannula  O2 Flow Rate (L/min) 4 L/min  Assess: MEWS Score  MEWS Temp 0  MEWS Systolic 0  MEWS Pulse 2  MEWS RR 0  MEWS LOC 0  MEWS Score 2  MEWS Score Color Yellow  Assess: if the MEWS score is Yellow or Red  Were vital signs taken at a resting state? Yes  Focused Assessment No change from prior assessment  Does the patient meet 2 or more of the SIRS criteria? No  Does the patient have a confirmed or suspected source of infection? No  Provider and Rapid Response Notified? Yes  MEWS guidelines implemented *See Row Information* Yes  Treat  Pain Scale 0-10  Pain Score 0  Take Vital Signs  Increase Vital Sign Frequency  Yellow: Q 2hr X 2 then Q 4hr X 2, if remains yellow, continue Q 4hrs  Escalate  MEWS: Escalate Yellow: discuss with charge nurse/RN and consider discussing with provider and RRT  Notify: Charge Nurse/RN  Name of Charge Nurse/RN Notified Carin Primrose.  Date Charge Nurse/RN Notified 11/22/20  Time Charge Nurse/RN Notified 2119  Notify: Provider  Provider Name/Title Lorayne Marek  Date Provider Notified 11/22/20  Time Provider Notified 1530  Notification Type Face-to-face  Notification Reason Other (Comment) (Elevated HR)  Provider response No new orders  Date of Provider Response 11/22/20  Time of Provider Response 1530  Document  Patient Outcome Not stable and remains on department  Progress note created (see row info) Yes  Assess: SIRS CRITERIA  SIRS Temperature  0  SIRS Pulse 1  SIRS Respirations  0  SIRS WBC 0  SIRS Score Sum  1

## 2020-11-22 NOTE — Op Note (Signed)
[facility]  Percutaneous Study/Intervention Procedural Note   Date of Surgery: 11/22/2020,12:38 PM  Surgeon:Emilly Lavey, Dolores Lory   Pre-operative Diagnosis: Acute left hemispheric CVA; atherosclerotic occlusive disease bilateral carotid arteries greater than 90% by duplex with a nondiagnostic study by MR secondary to motion  Post-operative diagnosis:  Same  Procedure(s) Performed:  1.  Arch aortogram  2.  Selective cannulation of the left common carotid  3.  Cervical imaging of the left carotid artery  4.  Intracranial imaging of the left cerebral arterial system  5.  StarClose right common femoral    Anesthesia: Conscious sedation was administered by the interventional radiology RN under my direct supervision. IV Versed plus fentanyl were utilized. Continuous ECG, pulse oximetry and blood pressure was monitored throughout the entire procedure.  Conscious sedation was administered for a total of 33 minutes.  Sheath: 5 French Pinnacle sheath retrograde right common femoral  Contrast: 40 cc   Fluoroscopy Time: 8.5 minutes  Indications:  The patient presents to [facility] with an acute CVA left hemispheric.  Duplex ultrasound was obtained as part of the initial work-up which demonstrated critical stenosis bilaterally.  Follow-up MR was performed without contrast secondary to her poor renal function.  This study was nondiagnostic secondary to motion artifact.  CT angio is precluded secondary to her renal function.  She is therefore undergoing diagnostic angiography with the hope of planning further intervention if needed.  Risks and benefits of been reviewed with the patient as well as with family all questions been answered all are in agreement with proceeding    Procedure:  Autumn [name] a 70 y.o. female who was identified and appropriate procedural time out was performed.  The patient was then placed supine on the table and prepped and draped in the  usual sterile fashion.  Ultrasound was used to evaluate the right common femoral artery.  It was echolucent and pulsatile indicating it is patent .  An ultrasound image was acquired for the permanent record.  A micropuncture needle was used to access the right common femoral artery under direct ultrasound guidance.  The microwire was then advanced under fluoroscopic guidance without difficulty followed by the micro-sheath.  A 0.035 J wire was advanced without resistance and a 5Fr sheath was placed.    Pigtail catheter was then advanced into the ascending aorta.  LAO projection of the aortic arch was obtained.  The origin of the left common carotid is widely patent initially an H1 catheter and then a JR4 catheter is tried however although I was able to access the common carotid artery I could not get the tract catheter to track more distally.  Ultimately, I switched for a Simmons 1 and selected the left common carotid.  With the catheter tip in the mid left common carotid artery cervical and intracranial views were obtained.  After review of the images the catheter was removed over wire and an RAO view of the groin was obtained. StarClose device was deployed without difficulty.   Findings:   Arch Aortogram:   The patient has a type II aortic arch.  The innominate and left common carotid arteries are widely patent at their origins.  The left subclavian demonstrates a 50% stenosis at its origin.  Left cervical carotid artery:   The left common carotid artery is widely patent.  There are mild atherosclerotic changes noted at the carotid bifurcation.  There are no hemodynamically significant lesions noted at the origin of the left internal carotid  artery.  Approximately 3 to 4 cm distal to the origin there is marked tortuosity but in the lateral and oblique views this does not appear to be flow-limiting.  There is a 40 to 50% stenosis in the distal internal carotid artery just proximal to the petrous portion.   The distalmost aspect of the internal carotid artery as well as the anterior cerebral and middle cerebral appear widely patent no critical lesions are noted.    Disposition: Patient was taken to the recovery room in stable condition having tolerated the procedure well.  [name] 11/22/2020,12:38 PM

## 2020-11-22 NOTE — Progress Notes (Signed)
Vascular Tech Steffanie Dunn came out to inject right fem site with lidocaine with epi. Site cleaned and new PAD applied.

## 2020-11-22 NOTE — Progress Notes (Signed)
PT Cancellation Note  Patient Details Name: Autumn [name] MRN: [id number] DOB: 18-Apr-1950   Cancelled Treatment:    Reason Eval/Treat Not Completed: Patient at procedure or test/unavailable Pt to reassess as able.  [employer], SPT

## 2020-11-22 NOTE — Progress Notes (Signed)
PROGRESS NOTE    Autumn [name]  UXL:[id number] DOB: [birth year] DOA: 11/09/2020 PCP: Harlow Ohms, MD  [id number]   Assessment & Plan:   Principal Problem:   Sepsis [facility]) Active Problems:   Atrial fibrillation with RVR ([place])   Osteomyelitis of foot, left, acute ([place])   CKD stage 4 due to type 2 diabetes mellitus ([place])   Liver cirrhosis secondary to NASH (nonalcoholic steatohepatitis) ([place])   Hypertension   History of anemia due to CKD   Frequent falls   AKI (acute kidney injury) ([place])   Cellulitis and abscess of foot, except toes   Infectious tenosynovitis   [place]   Atrial fibrillation with rapid ventricular response (HCC)   Bilateral carotid artery stenosis   70 yo F with history of T2DM, CKD, 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  She'd been following with a [facility] podiatrist outpatient and had recently refused surgery.  She'd recently been treated with antibiotics at [facility] for the diabetic foot infection with enterobacter aerogans culture positive.  She was admitted on 10/29 due to falls at home with worsening L foot chronic diabetic ulcer with osteomyelitis of the 2nd and 3rd metatarsals and cellulitis noted on MRI.  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  Her post operative course was complicated by unresponsiveness and neurologic changes, she was found to have a stroke and was intubated for airway protection.  She was extubated 11/3 and transferred to [facility] service on 11/4.   Acute Blood Loss Anemia Iron def In setting of heparin and recent surgical procedure - postop bleeding from surgical wound --s/p 2 units pRBC, Hgb stable in 8's. Labs with iron def, vit B12 elevated, folate wnl Plan: --monitor Hgb --cont iron suppl   Stroke Neurology suspects periprocedural vs related to atrial fibrillation MRI brian with acute to early subacute  infarction in the L posterior frontal cortical and subcortical brain, possibly affecting the precentral gyrus.   --Echo with EF 55-60%, dilated LA, patent foramen ovale --carotid US right ICA artery worrisome for a subtotal occlusion.  Moderate to large amount of L sided atheroscelotic plaque Plan: --Left carotid angiogram with Dr. Delana Meyer today, no significant stenosis --cleared to resume Eliquis tonight   Acute Metabolic Encephalopathy Related to acute infection and above Delirium precautions Follow and w/u further as indicated   Septic Shock Diabetic Foot Infection Left Foot Osteomyelitis and Cellulitis  Septic Arthritis MRI foot with septic arthritis of second MTP joint and osteomyelitis of 2nd metatarsal head and second proximal phalanx, severe soft tissue swelling around second metatarsal neck concerning for phlegmon developing abscess measuring 2.1x1 cm circumferentially around distal shaft.  Mild early osteomyelitis of 3rd metatarsal head.  Severe soft tissue edema of the forefoot c/w cellulitis. MRI ankle limited -> interval resection of 2nd metatarsal, longitudinal tear of peroneus brevis with peroneus tenosynovitis, distal tibialis posterior tenosynovitis and tendinopathy, extensor digitorum tenosynovitis.  Extensive subcutaneous edema circumferentially in distal calf.  Dorsal subcutaneous edema in foot. S/p 2nd ray amputation, I&D, and bone bx of 3rd metatarsal on 11/1 --ID consulted Plan: --cont IV unasyn for 4 weeks until 12/09/20 -followed by PO augmentin until 12/23/20 --NWB LLE --cont wound VAC --ID followup as outpatient   Acute Hypoxic Respiratory Failure Aspiration Pneumonia, treated Pulm edema 2/2 fluid overload CXR 11/3 with right lung opacity concerning for superimposed infection - also with findings concerning for  pulmonary edema and small right effusion --aspiration PNA considered treated with IV Unasyn --rapid called early morning 11/10 for respiratory  distress.  CXR showed pulm edema (pt had received MIVF for 4 days before I d/c'ed it yesterday) .  Started on BiPAP, and received IV lasix 40 x1, 80 x1 Plan: --metolazone x1 today --cont BiPAP PRN for respiratory distress  Atrial Fibrillation with RVR --stayed in RVR due to meds on hold Plan: --increase oral dilt to 90 mg q6h, per cardio --increase lopressor to 50 mg q6h, per cardio --resume Eliquis tonight   Thrombocytopenia --HIT ab neg.   Dysphagia 2/2 stroke --cont dys 1 nectar thick diet --SLP eval   AKI on CKD IV Baseline creatinine around 3.2 --nephrology consulted --pt had received MIVF for 4 days before I d/c'ed it on 11/9 --Monitor Cr while diuresing   Elevated Troponin Cardiology following, thought 2/2 demand   Hx NASH cirrhosis --compensated    Hypothyroidism --cont Synthroid   T2DM SSI, follow A1c 6.3   DVT prophylaxis: [id number] Code Status: Full code  Family Communication:  Level of care: Progressive Cardiac Dispo:   The patient is from: home Anticipated d/c is to: [facility] Anticipated d/c date is: 2-3 days Patient currently is not medically ready to d/c due to: Afib w RVR   Subjective and Interval History:  Pt was found lying flat, and pt complained of not being able to breath in that position, although [place] was hanging off her chin.  Pt's speech was much better.    Pt went for carotid angiography today, which did not find significant stenosis.  BP and HR elevated, as pt had missed couple doses of cardizem and Lopressor due to being in procedure.   Objective: Vitals:   11/22/20 1421 11/22/20 1430 11/22/20 1445 11/22/20 1500  BP: (!) 155/107 (!) 157/98  (!) 150/110  Pulse: (!) 136 (!) 139 (!) 134 (!) 119  Resp: (!) 36 (!) 28 (!) 23 19  Temp:    97.6 F (36.4 C)  TempSrc:    Oral  SpO2: 99% 96% 99% 98%  Weight:      Height:        Intake/Output Summary (Last 24 hours) at 11/22/2020 1752 Last data filed at 11/22/2020  1400 Gross per 24 hour  Intake 240 ml  Output 950 ml  Net -710 ml    Filed Weights   11/19/20 0412 11/20/20 0338 11/21/20 0454  Weight: 95.6 kg 95.8 kg 97.9 kg    Examination:   Constitutional: NAD, AAOx3, speech much improved HEENT: conjunctivae and lids normal, EOMI CV: No cyanosis.   RESP: normal respiratory effort, on RA SKIN: warm, dry Neuro: II - XII grossly intact.   Psych: Normal mood and affect.  Appropriate judgement and reason   Data Reviewed: I have personally reviewed following labs and imaging studies  CBC: Recent Labs  Lab 11/16/20 0246 11/17/20 0815 11/18/20 0227 11/18/20 1237 11/18/20 2038 11/19/20 0543 11/20/20 0504 11/21/20 0510 11/22/20 0709  WBC 10.8* 9.3 10.6*  --   --  12.5* 10.7* 12.6* 11.9*  NEUTROABS 8.5* 6.9 8.2*  --   --  9.6* 7.9*  --   --   HGB 8.0* 8.2* 6.4*   < > 9.0* 8.5* 8.4* 8.4* 8.5*  HCT 25.2* 26.2* 20.5*   < > 27.4* 26.6* 26.5* 26.9* 27.6*  MCV 86.3 88.8 89.1  --   --  91.4 93.3 94.7 96.2  PLT 168 134* 124*  --   --  104* 108* 113* 135*   < > = values in this interval not displayed.   Basic Metabolic Panel: Recent Labs  Lab 11/16/20 0246 11/17/20 0815 11/18/20 0227 11/19/20 0543 11/20/20 0504 11/21/20 0510 11/22/20 0709  NA 139 139 143 141 142 140 141  K 3.9 4.1 4.4 4.6 5.0 4.9 5.1  CL 101 102 106 107 107 108 107  CO2 25 28 26 25 26 25 25   GLUCOSE 166* 175* 194* 136* 132* 133* 124*  BUN 99* 97* 95* 79* 81* 71* 65*  CREATININE 4.79* 4.24* 3.89* 3.36* 3.32* 3.38* 3.54*  CALCIUM 8.2* 8.5* 8.5* 8.8* 9.0 8.6* 8.8*  MG 2.4 2.1 2.0 2.1 2.1 1.9 2.0  PHOS 5.9* 4.9* 3.8 3.8 4.0  --   --    GFR: Estimated Creatinine Clearance: 17.9 mL/min (A) (by C-G formula based on SCr of 3.54 mg/dL (H)). Liver Function Tests: Recent Labs  Lab 11/16/20 0246 11/17/20 0815 11/18/20 0227 11/19/20 0543 11/20/20 0504  AST 19 19 19 19 20   ALT 18 18 15 16 16   ALKPHOS 59 66 54 57 58  BILITOT 0.9 0.7 0.8 0.8 0.6  PROT 5.6* 5.8* 5.0*  4.9* 5.5*  ALBUMIN 2.1* 2.3* 2.0* 2.1* 2.3*   No results for input(s): LIPASE, AMYLASE in the last 168 hours. No results for input(s): AMMONIA in the last 168 hours. Coagulation Profile: Recent Labs  Lab 11/22/20 0709  INR 1.3*   Cardiac Enzymes: No results for input(s): CKTOTAL, CKMB, CKMBINDEX, TROPONINI in the last 168 hours. BNP (last 3 results) No results for input(s): PROBNP in the last 8760 hours. HbA1C: No results for input(s): HGBA1C in the last 72 hours. CBG: Recent Labs  Lab 11/21/20 1627 11/21/20 2023 11/22/20 0818 11/22/20 1040 11/22/20 1543  GLUCAP 142* 196* 131* 123* 139*   Lipid Profile: No results for input(s): CHOL, HDL, LDLCALC, TRIG, CHOLHDL, LDLDIRECT in the last 72 hours. Thyroid Function Tests: No results for input(s): TSH, T4TOTAL, FREET4, T3FREE, THYROIDAB in the last 72 hours. Anemia Panel: No results for input(s): VITAMINB12, FOLATE, FERRITIN, TIBC, IRON, RETICCTPCT in the last 72 hours. Sepsis Labs: Recent Labs  Lab 11/21/20 0510  PROCALCITON 0.10    Recent Results (from the past 240 hour(s))  MRSA Next Gen by PCR, Nasal     Status: None   Collection Time: 11/13/20 11:22 AM   Specimen: Nasal Mucosa; Nasal Swab  Result Value Ref Range Status   MRSA by PCR Next Gen NOT DETECTED NOT DETECTED Final    Comment: (NOTE) The GeneXpert MRSA Assay (FDA approved for NASAL specimens only), is one component of a comprehensive MRSA colonization surveillance program. It is not intended to diagnose MRSA infection nor to guide or monitor treatment for MRSA infections. Test performance is not FDA approved in patients less than 48 years old. Performed at [facility], [address] [place], [place] [address]   CULTURE, BLOOD (ROUTINE X 2) w Reflex to ID Panel     Status: None   Collection Time: 11/13/20 12:44 PM   Specimen: BLOOD  Result Value Ref Range Status   Specimen Description BLOOD LEFT ANTECUBITAL  Final   Special Requests    Final    BOTTLES DRAWN AEROBIC AND ANAEROBIC Blood Culture adequate volume   Culture   Final    NO GROWTH 5 DAYS Performed at [facility], [address] [place], [place] [address]    Report Status 11/18/2020 FINAL  Final  CULTURE, BLOOD (ROUTINE X 2) w  Reflex to ID Panel     Status: None   Collection Time: 11/13/20 12:52 PM   Specimen: BLOOD  Result Value Ref Range Status   Specimen Description BLOOD BLOOD LEFT HAND  Final   Special Requests   Final    BOTTLES DRAWN AEROBIC ONLY Blood Culture results may not be optimal due to an inadequate volume of blood received in culture bottles   Culture   Final    NO GROWTH 5 DAYS Performed at [facility], [address], [place], [place] [address]    Report Status 11/18/2020 FINAL  Final  Culture, Respiratory w Gram Stain     Status: None   Collection Time: 11/13/20  3:43 PM   Specimen: Tracheal Aspirate; Respiratory  Result Value Ref Range Status   Specimen Description   Final    TRACHEAL ASPIRATE Performed at [facility], [address] [place], [place] [address]    Special Requests   Final    NONE Performed at [facility], [place], [place] [address]    Gram Stain   Final    NO SQUAMOUS EPITHELIAL CELLS SEEN FEW WBC SEEN NO ORGANISMS SEEN Performed at [facility], [place] [address] [place],  [address]    Culture RARE CANDIDA ALBICANS  Final   Report Status 11/16/2020 FINAL  Final      Radiology Studies: CT HEAD WO CONTRAST (5MM)  Result Date: 11/21/2020 CLINICAL DATA:  Altered mental status EXAM: CT HEAD WITHOUT CONTRAST TECHNIQUE: Contiguous axial images were obtained from the base of the skull through the vertex without intravenous contrast. COMPARISON:  Previous studies including the examination of 11/19/2020 FINDINGS: Brain: There are no signs of bleeding within the cranium. There is small area of encephalomalacia in the left frontal cortex suggests  recent infarct. There is no focal mass effect. Cortical sulci are prominent. There is decreased density in periventricular and subcortical white matter. Vascular: Unremarkable. Skull: Unremarkable. Sinuses/Orbits: Unremarkable. Other: None IMPRESSION: There are no signs of bleeding within the cranium. No significant changes are noted in recent infarct in the left frontal cortex. Atrophy. Small-vessel disease. Electronically Signed   By: Elmer Picker M.D.   On: 11/21/2020 09:40   PERIPHERAL VASCULAR CATHETERIZATION  Result Date: 11/22/2020 See surgical note for result.  DG Chest Port 1 View  Result Date: 11/21/2020 CLINICAL DATA:  Acute hypoxic respiratory failure EXAM: PORTABLE CHEST 1 VIEW COMPARISON:  11/15/2020 FINDINGS: Cardiomegaly with mild interstitial edema. Mild right infrahilar opacity, favoring edema, although superimposed right lower lobe pneumonia is not excluded. Possible small left pleural effusion. No pneumothorax. IMPRESSION: Cardiomegaly with mild interstitial edema and possible small left pleural effusion. Mild right infrahilar opacity, favoring edema, superimposed right lower lobe pneumonia not excluded. Electronically Signed   By: Julian Hy M.D.   On: 11/21/2020 03:16     Scheduled Meds:  sodium chloride   Intravenous Once   allopurinol  200 mg Per Tube Daily   apixaban  5 mg Oral BID   vitamin C  500 mg Per Tube BID   budesonide (PULMICORT) nebulizer solution  0.5 mg Nebulization BID   Chlorhexidine Gluconate Cloth  6 each Topical Daily   diltiazem  10 mg Intravenous Once   diltiazem  90 mg Oral Q6H   diphenhydrAMINE       docusate  100 mg Per Tube BID   feeding supplement  237 mL Oral TID BM   fentaNYL  ferrous gluconate  324 mg Oral Q breakfast   heparin sodium (porcine)       heparin sodium (porcine)       hydrALAZINE       insulin aspart  0-6 Units Subcutaneous TID WC   levothyroxine  100 mcg Per Tube Q0600   mouth rinse  15 mL Mouth Rinse  BID   metolazone  2.5 mg Oral Once   metoprolol tartrate  50 mg Oral Q6H   midazolam       multivitamin with minerals  1 tablet Oral Daily   pantoprazole (PROTONIX) IV  40 mg Intravenous Daily   polyethylene glycol  17 g Per Tube Daily   potassium chloride  40 mEq Oral Once   sodium chloride flush  3 mL Intravenous Q12H   sodium chloride flush  3 mL Intravenous Q12H   venlafaxine  100 mg Per Tube BID   Continuous Infusions:  sodium chloride 5 mL/hr at 11/18/20 0700   sodium chloride     sodium chloride     ampicillin-sulbactam (UNASYN) IV 3 g (11/21/20 2203)     LOS: 13 days     Enzo Bi, MD [facility] If 7PM-7AM, please contact night-coverage 11/22/2020, 5:52 PM

## 2020-11-22 NOTE — Plan of Care (Addendum)
Neurology plan of care  Per vascular, no hemodynamically significant lesions in head or neck. OK with neuro to restart heparin gtt or eliquis for a fib when safe to do so from blood loss standpoint and ok with vascular. No further inpatient neurologic workup indicated. I will arrange o/p f/u with neuro.  Neurology will sign off, but please re-engage if additional questions arise.  Su Monks, MD [facility] [phone]  If 7pm- 7am, please page neurology on call as listed in [place].

## 2020-11-22 NOTE — Progress Notes (Signed)
PT down for procedure. Initially pt unsure as to why she was here. Autumn Bump, PA called down and spoke with patient and re-educated and answered questions. Questions also fielded by this RN and other assisting in preparation. PT did not have consent signed when she arrived. PT appears mildly confused as to reason for procedure and repeats a phrase about the left hand not knowing what the right hand is doing and vice versa. Due to confusion, pt gave permission to call friend Autumn [name]. Autumn [name] spoke with Elberta Fortis and Maudie Mercury, [place] and she gave consent for procedure. Noted in social work note that this friend is the appropriate contact - no family contact listed and pt denies any relatives. PT agreeable  procedure at this time.

## 2020-11-22 NOTE — Interval H&P Note (Signed)
History and Physical Interval Note:  11/22/2020 11:50 AM  Autumn [name]  has presented today for surgery, with the diagnosis of Carotid Stenosis.  The various methods of treatment have been discussed with the patient and family. After consideration of risks, benefits and other options for treatment, the patient has consented to  Procedure(s): CAROTID ANGIOGRAPHY (Left) as a surgical intervention.  [name] history has been reviewed, patient examined, no change in status, stable for surgery.  I have reviewed the patient's chart and labs.  Questions were answered to the patient's satisfaction.     Hortencia Pilar

## 2020-11-22 NOTE — Progress Notes (Signed)
Progress Note  Patient Name: Autumn [name] Date of Encounter: 11/22/2020  [facility] Cardiologist: New to [facility]  Subjective   On rounds this afternoon after PV procedure, Laying supine secondary to groin access  Reports having worsening shortness of breath Telemetry reviewed, atrial fibrillation with rate 130s Appears she has missed several doses of her medications including last dose of diltiazem and metoprolol given for rate control  Inpatient Medications    Scheduled Meds:  sodium chloride   Intravenous Once   allopurinol  200 mg Per Tube Daily   apixaban  5 mg Oral BID   vitamin C  500 mg Per Tube BID   budesonide (PULMICORT) nebulizer solution  0.5 mg Nebulization BID   Chlorhexidine Gluconate Cloth  6 each Topical Daily   diltiazem  10 mg Intravenous Once   diltiazem  90 mg Oral Q6H   diphenhydrAMINE       docusate  100 mg Per Tube BID   feeding supplement  237 mL Oral TID BM   fentaNYL       ferrous gluconate  324 mg Oral Q breakfast   heparin sodium (porcine)       heparin sodium (porcine)       hydrALAZINE       insulin aspart  0-6 Units Subcutaneous TID WC   levothyroxine  100 mcg Per Tube Q0600   mouth rinse  15 mL Mouth Rinse BID   metolazone  2.5 mg Oral Once   metoprolol tartrate  50 mg Oral Q6H   midazolam       multivitamin with minerals  1 tablet Oral Daily   pantoprazole (PROTONIX) IV  40 mg Intravenous Daily   polyethylene glycol  17 g Per Tube Daily   potassium chloride  40 mEq Oral Once   sodium chloride flush  3 mL Intravenous Q12H   sodium chloride flush  3 mL Intravenous Q12H   venlafaxine  100 mg Per Tube BID   Continuous Infusions:  sodium chloride 5 mL/hr at 11/18/20 0700   sodium chloride     sodium chloride     ampicillin-sulbactam (UNASYN) IV 3 g (11/21/20 2203)   PRN Meds: sodium chloride, sodium chloride, sodium chloride, [DISCONTINUED] acetaminophen **OR** acetaminophen, acetaminophen, albuterol, diltiazem,  hydrALAZINE, HYDROmorphone (DILAUDID) injection, meclizine, ondansetron **OR** ondansetron (ZOFRAN) IV, ondansetron (ZOFRAN) IV, sodium chloride flush, sodium chloride flush   Vital Signs    Vitals:   11/22/20 1421 11/22/20 1430 11/22/20 1445 11/22/20 1500  BP: (!) 155/107 (!) 157/98  (!) 150/110  Pulse: (!) 136 (!) 139 (!) 134 (!) 119  Resp: (!) 36 (!) 28 (!) 23 19  Temp:    97.6 F (36.4 C)  TempSrc:    Oral  SpO2: 99% 96% 99% 98%  Weight:      Height:        Intake/Output Summary (Last 24 hours) at 11/22/2020 1711 Last data filed at 11/22/2020 1400 Gross per 24 hour  Intake 240 ml  Output 950 ml  Net -710 ml   Last 3 Weights 11/21/2020 11/20/2020 11/19/2020  Weight (lbs) 215 lb 13.3 oz 211 lb 3.2 oz 210 lb 12.2 oz  Weight (kg) 97.9 kg 95.8 kg 95.6 kg      Telemetry    Atrial fibrillation with RVR rate 130- Personally Reviewed  ECG    - Personally Reviewed  Physical Exam   GEN: Mild respiratory distress Neck: Unable to estimate JVD Cardiac: Irregularly irregular, rapid no murmurs, rubs, or  gallops.  Respiratory: Clear to auscultation bilaterally. GI: Soft, nontender, non-distended  MS: No edema; No deformity. Neuro:  Nonfocal  Psych: Normal affect, anxious  Labs    High Sensitivity Troponin:   Recent Labs  Lab 11/09/20 1220 11/09/20 1818 11/10/20 0115 11/10/20 0649 11/21/20 0510  TROPONINIHS 147* 151* 162* 149* 81*     Chemistry Recent Labs  Lab 11/18/20 0227 11/19/20 0543 11/20/20 0504 11/21/20 0510 11/22/20 0709  NA 143 141 142 140 141  K 4.4 4.6 5.0 4.9 5.1  CL 106 107 107 108 107  CO2 26 25 26 25 25   GLUCOSE 194* 136* 132* 133* 124*  BUN 95* 79* 81* 71* 65*  CREATININE 3.89* 3.36* 3.32* 3.38* 3.54*  CALCIUM 8.5* 8.8* 9.0 8.6* 8.8*  MG 2.0 2.1 2.1 1.9 2.0  PROT 5.0* 4.9* 5.5*  --   --   ALBUMIN 2.0* 2.1* 2.3*  --   --   AST 19 19 20   --   --   ALT 15 16 16   --   --   ALKPHOS 54 57 58  --   --   BILITOT 0.8 0.8 0.6  --   --    GFRNONAA 12* 14* 15* 14* 13*  ANIONGAP 11 9 9 7 9     Lipids No results for input(s): CHOL, TRIG, HDL, LABVLDL, LDLCALC, CHOLHDL in the last 168 hours.  Hematology Recent Labs  Lab 11/20/20 0504 11/21/20 0510 11/22/20 0709  WBC 10.7* 12.6* 11.9*  RBC 2.84* 2.84* 2.87*  HGB 8.4* 8.4* 8.5*  HCT 26.5* 26.9* 27.6*  MCV 93.3 94.7 96.2  MCH 29.6 29.6 29.6  MCHC 31.7 31.2 30.8  RDW 16.4* 17.2* 17.6*  PLT 108* 113* 135*   Thyroid No results for input(s): TSH, FREET4 in the last 168 hours.  BNP Recent Labs  Lab 11/21/20 0510  BNP 589.6*    DDimer No results for input(s): DDIMER in the last 168 hours.   Radiology    CT HEAD WO CONTRAST (5MM)  Result Date: 11/21/2020 CLINICAL DATA:  Altered mental status EXAM: CT HEAD WITHOUT CONTRAST TECHNIQUE: Contiguous axial images were obtained from the base of the skull through the vertex without intravenous contrast. COMPARISON:  Previous studies including the examination of 11/19/2020 FINDINGS: Brain: There are no signs of bleeding within the cranium. There is small area of encephalomalacia in the left frontal cortex suggests recent infarct. There is no focal mass effect. Cortical sulci are prominent. There is decreased density in periventricular and subcortical white matter. Vascular: Unremarkable. Skull: Unremarkable. Sinuses/Orbits: Unremarkable. Other: None IMPRESSION: There are no signs of bleeding within the cranium. No significant changes are noted in recent infarct in the left frontal cortex. Atrophy. Small-vessel disease. Electronically Signed   By: Elmer Picker M.D.   On: 11/21/2020 09:40   PERIPHERAL VASCULAR CATHETERIZATION  Result Date: 11/22/2020 See surgical note for result.  DG Chest Port 1 View  Result Date: 11/21/2020 CLINICAL DATA:  Acute hypoxic respiratory failure EXAM: PORTABLE CHEST 1 VIEW COMPARISON:  11/15/2020 FINDINGS: Cardiomegaly with mild interstitial edema. Mild right infrahilar opacity, favoring edema,  although superimposed right lower lobe pneumonia is not excluded. Possible small left pleural effusion. No pneumothorax. IMPRESSION: Cardiomegaly with mild interstitial edema and possible small left pleural effusion. Mild right infrahilar opacity, favoring edema, superimposed right lower lobe pneumonia not excluded. Electronically Signed   By: Julian Hy M.D.   On: 11/21/2020 03:16    Cardiac Studies     Patient Profile  KENZLI BARRITT is a 70 y.o. female with a hx of HFpEF, CKD stage IV-V, diabetes since her late 43s to early 65s with diabetic neuropathy and diabetic foot ulcer, HTN, anemia of chronic disease, hepatic cirrhosis, sleep apnea, and gout who is being seen today for the evaluation of Afib with RVR   Assessment & Plan    1.  Atrial fibrillation with RVR Medications held today for PV procedure Now with atrial fibrillation with RVR, respiratory distress Similar event happened 2 nights ago --We will increase diltiazem up to 90 every 6 give dose now, metoprolol 50 every 6 hours, will give dose now -Also give dose of Lasix On nasal cannula oxygen Discussed with nursing, they will verify medications with pharmacy and give now Eliquis for CHADS VASC 6 when cleared by neurology   2.  Respiratory distress Had episode 2 nights ago 2:30 AM requiring BiPAP In the setting of age fibrillation with RVR, pulmonary edema, poorly controlled hypertension -Similar circumstances now after medications held during the day for PV procedure -We will likely need to change medications above to long-acting medications as she continues to miss doses -May need to uptitrate diltiazem and metoprolol for further rate and blood pressure control -Additional doses of IV Lasix depending on renal function in the morning   2.  Elevated troponin Demand ischemia in the setting of osteomyelitis, atrial fibrillation with RVR, chronic kidney disease, anemia of chronic disease, acute stroke -Medical  management recommended Not a good candidate at this time for ischemic work-up   3.  Sepsis/osteomyelitis On antibiotics Worsening left foot chronic diabetic ulcer with osteo-, cellulitis on MRI Has had amputations of toes, I&D of abscess -Underwent PV procedure today   4.  Acute stroke Rapid response soon after admission,  transferred to ICU, intubated November 2 with evolving neurologic changes, unresponsive Code stroke, MRI confirming acute stroke Followed by neurology Etiology possibly from atrial fibrillation -Anticoagulation per neurology    Total encounter time more than 25 minutes  Greater than 50% was spent in counseling and coordination of care with the patient   For questions or updates, please contact [place] Please consult [email or web address] for contact info under        Signed, Ida Rogue, MD  11/22/2020, 5:11 PM

## 2020-11-22 NOTE — Consult Note (Signed)
Pharmacy Antibiotic Note  Autumn [name] is a 70 y.o. female admitted on 11/09/2020 with  osteomyelitis .  Pharmacy has been consulted for Unasyn dosing. ID following.   Presented with left foot draining. PMH includes liver cirrhosis d/t NASH, HTN, thyroid disease, DM.   11/2-11/3: MRI of foot c/w osteomyelitis and abscess. Cultures of left foot abscess resulted, narrowing to Unasyn as per discussion with ID. 11/10-11/11: Chest CT c/f PNA. Covered by current abx. Scr improved since admission, remains persistently elevated 3.3-3.5. Pt may have degree of volume overload contributing.  Plan: Ampicillin/sulbactam 3 grams every 12 hours (planning DOT 4wks per ID rec)   Height: 5\' 6"  (167.6 cm) Weight: 97.9 kg (215 lb 13.3 oz) IBW/kg (Calculated) : 59.3  Temp (24hrs), Avg:97.7 F (36.5 C), Min:97.4 F (36.3 C), Max:98 F (36.7 C)  Recent Labs  Lab 11/18/20 0227 11/19/20 0543 11/20/20 0504 11/21/20 0510 11/22/20 0709  WBC 10.6* 12.5* 10.7* 12.6* 11.9*  CREATININE 3.89* 3.36* 3.32* 3.38* 3.54*     Estimated Creatinine Clearance: 17.9 mL/min (A) (by C-G formula based on SCr of 3.54 mg/dL (H)).    Allergies  Allergen Reactions   Codeine Nausea And Vomiting    Antimicrobials this admission: ongoing 11/3 Unasyn >> Completed 10/29 Vancomycin >> 10/31 10/29 Cefepime >> 11/01 10/29 Metronidazole >> 11/01 11/02 Zosyn >> 11/3  Microbiology results: 10/30 MRSA PCR: (-) 10/31 Wound (2nd metatarsal): strep mitis/oralis, pasteurella, MSSA, bacteroides 10/31 Wound (metatarsal): bacteroides, peptostreptococcus micros 10/31 Abscess (left foot): MSSA, pasteurella 11/2 Bcx Ng5d (final)  Thank you for allowing pharmacy to be a part of this patient's care.  Lorna Dibble, PharmD, [facility] Clinical Pharmacist 11/22/2020 8:12 AM

## 2020-11-22 NOTE — Progress Notes (Signed)
This RN went to re-assess R groin access site to find that site is slightly oozing. R groin soft, femoral pulse, pedal pulse, post-tibial pulses intact. Charge RN Lysbeth Galas called in to confirm oozing. [facility] recovery called, and directed per Arby Barrette to send Dr. Delana Meyer a message. Dr. Jenkins Rouge notified of slight ooze. Waiting for response.

## 2020-11-22 NOTE — Progress Notes (Signed)
OT Cancellation Note  Patient Details Name: Autumn [name] MRN: [id number] DOB: [birth year]   Cancelled Treatment:    Reason Eval/Treat Not Completed: Patient at procedure or test/ unavailable. Pt noted to be off the floor for procedure, unavailable at this time. Will continue to follow POC at later date/time as pt available.   Dessie Coma, M.S. OTR/L  11/22/20, 10:45 AM  ascom [phone]

## 2020-11-23 DIAGNOSIS — E1122 Type 2 diabetes mellitus with diabetic chronic kidney disease: Secondary | ICD-10-CM | POA: Diagnosis not present

## 2020-11-23 DIAGNOSIS — I10 Essential (primary) hypertension: Secondary | ICD-10-CM | POA: Diagnosis not present

## 2020-11-23 DIAGNOSIS — N184 Chronic kidney disease, stage 4 (severe): Secondary | ICD-10-CM | POA: Diagnosis not present

## 2020-11-23 DIAGNOSIS — I4891 Unspecified atrial fibrillation: Secondary | ICD-10-CM | POA: Diagnosis not present

## 2020-11-23 LAB — BASIC METABOLIC PANEL
Anion gap: 11 (ref 5–15)
BUN: 72 mg/dL — ABNORMAL HIGH (ref 8–23)
CO2: 26 mmol/L (ref 22–32)
Calcium: 8.8 mg/dL — ABNORMAL LOW (ref 8.9–10.3)
Chloride: 105 mmol/L (ref 98–111)
Creatinine, Ser: 3.56 mg/dL — ABNORMAL HIGH (ref 0.44–1.00)
GFR, Estimated: 13 mL/min — ABNORMAL LOW (ref >60)
Glucose, Bld: 133 mg/dL — ABNORMAL HIGH (ref 70–99)
Potassium: 4.8 mmol/L (ref 3.5–5.1)
Sodium: 142 mmol/L (ref 135–145)

## 2020-11-23 LAB — CBC
HCT: 27.3 % — ABNORMAL LOW (ref 36.0–46.0)
Hemoglobin: 8.4 g/dL — ABNORMAL LOW (ref 12.0–15.0)
MCH: 29.5 pg (ref 26.0–34.0)
MCHC: 30.8 g/dL (ref 30.0–36.0)
MCV: 95.8 fL (ref 80.0–100.0)
Platelets: 123 10*3/uL — ABNORMAL LOW (ref 150–400)
RBC: 2.85 MIL/uL — ABNORMAL LOW (ref 3.87–5.11)
RDW: 17.8 % — ABNORMAL HIGH (ref 11.5–15.5)
WBC: 9.2 10*3/uL (ref 4.0–10.5)
nRBC: 0.2 % (ref 0.0–0.2)

## 2020-11-23 LAB — MAGNESIUM: Magnesium: 1.8 mg/dL (ref 1.7–2.4)

## 2020-11-23 LAB — GLUCOSE, CAPILLARY
Glucose-Capillary: 122 mg/dL — ABNORMAL HIGH (ref 70–99)
Glucose-Capillary: 157 mg/dL — ABNORMAL HIGH (ref 70–99)
Glucose-Capillary: 120 mg/dL — ABNORMAL HIGH (ref 70–99)

## 2020-11-23 MED ORDER — METOPROLOL TARTRATE 50 MG PO TABS
100.0000 mg | ORAL_TABLET | Freq: Two times a day (BID) | ORAL | Status: DC
  Administered 2020-11-24 – 2020-11-25 (×2): 100 mg via ORAL
  Filled 2020-11-23 (×3): qty 2

## 2020-11-23 MED ORDER — DILTIAZEM HCL ER COATED BEADS 180 MG PO CP24
360.0000 mg | ORAL_CAPSULE | Freq: Every day | ORAL | Status: DC
  Administered 2020-11-24: 360 mg via ORAL
  Filled 2020-11-23: qty 2

## 2020-11-23 MED ORDER — METOPROLOL TARTRATE 50 MG PO TABS
50.0000 mg | ORAL_TABLET | Freq: Once | ORAL | Status: AC
  Administered 2020-11-23: 50 mg via ORAL
  Filled 2020-11-23: qty 1

## 2020-11-23 MED ORDER — ATORVASTATIN CALCIUM 20 MG PO TABS
40.0000 mg | ORAL_TABLET | Freq: Every day | ORAL | Status: DC
  Administered 2020-11-23 – 2020-12-21 (×28): 40 mg via ORAL
  Filled 2020-11-23 (×28): qty 2

## 2020-11-23 MED ORDER — ALLOPURINOL 100 MG PO TABS
100.0000 mg | ORAL_TABLET | Freq: Every day | ORAL | Status: DC
Start: 2020-11-24 — End: 2020-11-24
  Administered 2020-11-24: 100 mg
  Filled 2020-11-23: qty 1

## 2020-11-23 NOTE — Progress Notes (Signed)
[facility]  PROGRESS NOTE   Subjective:   Patient is awake and alert.  Objective:  Vital signs in last 24 hours:  Temp:  [97.5 F (36.4 C)-98.7 F (37.1 C)] 98.7 F (37.1 C) (11/12 1427) Pulse Rate:  [54-117] 117 (11/12 1427) Resp:  [12-20] 20 (11/12 1427) BP: (133-168)/(89-132) 158/113 (11/12 1427) SpO2:  [94 %-100 %] 98 % (11/12 1427) FiO2 (%):  [30 %] 30 % (11/12 0828)  Weight change:  Filed Weights   11/19/20 0412 11/20/20 0338 11/21/20 0454  Weight: 95.6 kg 95.8 kg 97.9 kg    Intake/Output: I/O last 3 completed shifts: In: 957.8 [P.O.:90; I.V.:530; IV Piggyback:337.8] Out: 1050 [Urine:1050]   Intake/Output this shift:  Total I/O In: 240 [P.O.:240] Out: -   Physical Exam: General:  No acute distress  Head:  Normocephalic, atraumatic. Moist oral mucosal membranes  Eyes:  Anicteric  Neck:  Supple  Lungs:   Clear to auscultation, normal effort  Heart:  S1S2 no rubs  Abdomen:   Soft, nontender, bowel sounds present  Extremities:  peripheral edema.  Neurologic:  Awake, alert, following commands  Skin:  No lesions  Access:     Basic Metabolic Panel: Recent Labs  Lab 11/17/20 0815 11/18/20 0227 11/19/20 0543 11/20/20 0504 11/21/20 0510 11/22/20 0709 11/23/20 0333  NA 139 143 141 142 140 141 142  K 4.1 4.4 4.6 5.0 4.9 5.1 4.8  CL 102 106 107 107 108 107 105  CO2 28 26 25 26 25 25 26   GLUCOSE 175* 194* 136* 132* 133* 124* 133*  BUN 97* 95* 79* 81* 71* 65* 72*  CREATININE 4.24* 3.89* 3.36* 3.32* 3.38* 3.54* 3.56*  CALCIUM 8.5* 8.5* 8.8* 9.0 8.6* 8.8* 8.8*  MG 2.1 2.0 2.1 2.1 1.9 2.0 1.8  PHOS 4.9* 3.8 3.8 4.0  --   --   --     CBC: Recent Labs  Lab 11/17/20 0815 11/18/20 0227 11/18/20 1237 11/19/20 0543 11/20/20 0504 11/21/20 0510 11/22/20 0709 11/23/20 0333  WBC 9.3 10.6*  --  12.5* 10.7* 12.6* 11.9* 9.2  NEUTROABS 6.9 8.2*  --  9.6* 7.9*  --   --   --   HGB 8.2* 6.4*   < > 8.5* 8.4* 8.4* 8.5* 8.4*  HCT 26.2* 20.5*   < >  26.6* 26.5* 26.9* 27.6* 27.3*  MCV 88.8 89.1  --  91.4 93.3 94.7 96.2 95.8  PLT 134* 124*  --  104* 108* 113* 135* 123*   < > = values in this interval not displayed.     Urinalysis: No results for input(s): COLORURINE, LABSPEC, PHURINE, GLUCOSEU, HGBUR, BILIRUBINUR, KETONESUR, PROTEINUR, UROBILINOGEN, NITRITE, LEUKOCYTESUR in the last 72 hours.  Invalid input(s): APPERANCEUR    Imaging: PERIPHERAL VASCULAR CATHETERIZATION  Result Date: 11/22/2020 See surgical note for result.    Medications:    sodium chloride 10 mL/hr (11/23/20 1022)   sodium chloride     sodium chloride     ampicillin-sulbactam (UNASYN) IV 3 g (11/23/20 1023)    sodium chloride   Intravenous Once   allopurinol  200 mg Per Tube Daily   apixaban  5 mg Oral BID   vitamin C  500 mg Per Tube BID   atorvastatin  40 mg Oral Daily   budesonide (PULMICORT) nebulizer solution  0.5 mg Nebulization BID   [START ON 11/24/2020] diltiazem  360 mg Oral Daily   diltiazem  10 mg Intravenous Once   diltiazem  90 mg Oral Q6H   docusate  100 mg Per Tube BID   feeding supplement  237 mL Oral TID BM   ferrous gluconate  324 mg Oral Q breakfast   insulin aspart  0-6 Units Subcutaneous TID WC   levothyroxine  100 mcg Per Tube Q0600   mouth rinse  15 mL Mouth Rinse BID   metoprolol tartrate  100 mg Oral BID   multivitamin with minerals  1 tablet Oral Daily   pantoprazole (PROTONIX) IV  40 mg Intravenous Daily   polyethylene glycol  17 g Per Tube Daily   sodium chloride flush  3 mL Intravenous Q12H   sodium chloride flush  3 mL Intravenous Q12H   venlafaxine  100 mg Per Tube BID    Assessment/ Plan:     Principal Problem:   Sepsis ([place]) Active Problems:   Atrial fibrillation with RVR (HCC)   Osteomyelitis of foot, left, acute ([place])   CKD stage 4 due to type 2 diabetes mellitus ([place])   Liver cirrhosis secondary to NASH (nonalcoholic steatohepatitis) ([place])   Hypertension   History of anemia due to CKD   Frequent  falls   AKI (acute kidney injury) ([place])   Cellulitis and abscess of foot, except toes   Infectious tenosynovitis   Wheeze   Atrial fibrillation with rapid ventricular response (HCC)   Bilateral carotid artery stenosis  70 year old female with history of chronic kidney disease stage IV, hypertension, diabetes, peripheral vascular disease, diabetic foot ulcer, osteomyelitis, cirrhosis, obstructive sleep apnea and atrial fibrillation.  #1: Acute kidney injury on CKD: Patient has acute kidney injury on the top of chronic kidney disease.  Nephrotoxic agents were removed.  She was also given IV fluid resuscitation.  Renal indicis are presently stable and are close to baseline.  #2: Osteomyelitis: Patient is on Unasyn at this time.  #3: Anemia: Anemia secondary to CKD.  Continue iron orally.  Epogen as outpatient.  #4: Diabetes: Continue insulin as ordered.  #5: Gout: Patient is on allopurinol at 200 mg daily.  We will decrease the dose to 100 mg daily.  #6: Hypertension: Continue antihypertensive medications for now.   LOS: [place], [facility] 11/12/20224:07 PM

## 2020-11-23 NOTE — Progress Notes (Signed)
PROGRESS NOTE    Autumn [name]  OXB:[id number] DOB: [birth year] DOA: 11/09/2020 PCP: Harlow Ohms, MD  [id number]   Assessment & Plan:   Principal Problem:   Sepsis [facility] Active Problems:   Atrial fibrillation with RVR ([place])   Osteomyelitis of foot, left, acute ([place])   CKD stage 4 due to type 2 diabetes mellitus ([place])   Liver cirrhosis secondary to NASH (nonalcoholic steatohepatitis) ([place])   Hypertension   History of anemia due to CKD   Frequent falls   AKI (acute kidney injury) ([place])   Cellulitis and abscess of foot, except toes   Infectious tenosynovitis   [place]   Atrial fibrillation with rapid ventricular response (HCC)   Bilateral carotid artery stenosis   70 yo F with history of T2DM, CKD, 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  She'd been following with a [facility] podiatrist outpatient and had recently refused surgery.  She'd recently been treated with antibiotics at [facility] for the diabetic foot infection with enterobacter aerogans culture positive.  She was admitted on 10/29 due to falls at home with worsening L foot chronic diabetic ulcer with osteomyelitis of the 2nd and 3rd metatarsals and cellulitis noted on MRI.  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  Her post operative course was complicated by unresponsiveness and neurologic changes, she was found to have a stroke and was intubated for airway protection.  She was extubated 11/3 and transferred to [facility] service on 11/4.   Acute Blood Loss Anemia Iron def In setting of heparin and recent surgical procedure - postop bleeding from surgical wound --s/p 2 units pRBC, Hgb stable in 8's. Labs with iron def, vit B12 elevated, folate wnl Plan: --monitor Hgb --cont iron suppl   Stroke Expressive aphasia, improved Neurology suspects periprocedural vs related to atrial fibrillation MRI brian  with acute to early subacute infarction in the L posterior frontal cortical and subcortical brain, possibly affecting the precentral gyrus.   --Echo with EF 55-60%, dilated LA, patent foramen ovale --carotid US right ICA artery worrisome for a subtotal occlusion.  Moderate to large amount of L sided atheroscelotic plaque, however, Left carotid angiogram with Dr. Delana Meyer  showed no significant stenosis. Plan: --cont Eliquis --cont statin   Acute Metabolic Encephalopathy Related to acute infection and above Delirium precautions Follow and w/u further as indicated   Septic Shock Diabetic Foot Infection Left Foot Osteomyelitis and Cellulitis  Septic Arthritis MRI foot with septic arthritis of second MTP joint and osteomyelitis of 2nd metatarsal head and second proximal phalanx, severe soft tissue swelling around second metatarsal neck concerning for phlegmon developing abscess measuring 2.1x1 cm circumferentially around distal shaft.  Mild early osteomyelitis of 3rd metatarsal head.  Severe soft tissue edema of the forefoot c/w cellulitis. MRI ankle limited -> interval resection of 2nd metatarsal, longitudinal tear of peroneus brevis with peroneus tenosynovitis, distal tibialis posterior tenosynovitis and tendinopathy, extensor digitorum tenosynovitis.  Extensive subcutaneous edema circumferentially in distal calf.  Dorsal subcutaneous edema in foot. S/p 2nd ray amputation, I&D, and bone bx of 3rd metatarsal on 11/1 --ID consulted Plan: --cont IV unasyn for 4 weeks until 12/09/20 -followed by PO augmentin until 12/23/20 --NWB LLE --cont wound VAC --ID followup as outpatient   Acute Hypoxic Respiratory Failure Aspiration Pneumonia, treated Pulm edema 2/2 fluid overload CXR 11/3 with right lung opacity concerning for superimposed infection - also  with findings concerning for pulmonary edema and small right effusion --aspiration PNA considered treated with IV Unasyn --rapid called early  morning 11/10 for respiratory distress.  CXR showed pulm edema (pt had received MIVF for 4 days before I d/c'ed it yesterday).  Started on BiPAP, and received IV lasix 40 x1, 80 x1, metolazone x1 Plan: --cont BiPAP PRN for respiratory distress  Atrial Fibrillation with RVR --stayed in RVR due to meds on hold Plan: --cont dilt 90 mg q6h --increase Lopressor to 100 mg BID, per cardio --cont Eliquis   Thrombocytopenia --HIT ab neg.   Dysphagia 2/2 stroke --SLP eval today, upgraded diet to Dys 3, thin liquid   AKI on CKD IV Baseline creatinine around 3.2 --nephrology consulted   Elevated Troponin Cardiology following, thought 2/2 demand   Hx NASH cirrhosis --compensated    Hypothyroidism --cont Synthroid   T2DM A1c 6.3 --BG has been within inpatient goal, has not needed SSI. --d/c BG checks and SSI   DVT prophylaxis: QB:HALPFXT Code Status: Full code  Family Communication:  Level of care: Progressive Cardiac Dispo:   The patient is from: home Anticipated d/c is to: [facility] inpatient rehab Anticipated d/c date is: 1-2 days Patient currently is not medically ready to d/c due to: Afib w RVR   Subjective and Interval History:  Pt was sitting at side of bed today.   Pt said she didn't know what's going on and no one told her anything.  Pt couldn't tell me if she had been on BiPAP.  Pt said she wants to go home and that [name] would take care of her.  Diet was upgraded.     Objective: Vitals:   11/23/20 0828 11/23/20 0920 11/23/20 1128 11/23/20 1427  BP:  (!) 138/108 (!) 168/113 (!) 158/113  Pulse: (!) 103 (!) 103 (!) 113 (!) 117  Resp: 12 16 16 20   Temp:  98.1 F (36.7 C) 97.6 F (36.4 C) 98.7 F (37.1 C)  TempSrc:  Oral    SpO2: 100% 96% 94% 98%  Weight:      Height:        Intake/Output Summary (Last 24 hours) at 11/23/2020 1656 Last data filed at 11/23/2020 1410 Gross per 24 hour  Intake 1197.79 ml  Output 700 ml  Net 497.79 ml    Filed Weights    11/19/20 0412 11/20/20 0338 11/21/20 0454  Weight: 95.6 kg 95.8 kg 97.9 kg    Examination:   Constitutional: NAD, alert, oriented to person and place, sitting at side of bed, speech much improved. HEENT: conjunctivae and lids normal, EOMI CV: No cyanosis.   RESP: normal respiratory effort, on 2L Extremities: swelling in LLE, left foot wrapped and connected to wound vac SKIN: warm, dry Neuro: II - XII grossly intact.   Psych: grouchy mood and affect.     Data Reviewed: I have personally reviewed following labs and imaging studies  CBC: Recent Labs  Lab 11/17/20 0815 11/18/20 0227 11/18/20 1237 11/19/20 0543 11/20/20 0504 11/21/20 0510 11/22/20 0709 11/23/20 0333  WBC 9.3 10.6*  --  12.5* 10.7* 12.6* 11.9* 9.2  NEUTROABS 6.9 8.2*  --  9.6* 7.9*  --   --   --   HGB 8.2* 6.4*   < > 8.5* 8.4* 8.4* 8.5* 8.4*  HCT 26.2* 20.5*   < > 26.6* 26.5* 26.9* 27.6* 27.3*  MCV 88.8 89.1  --  91.4 93.3 94.7 96.2 95.8  PLT 134* 124*  --  104* 108* 113* 135* 123*   < > =  values in this interval not displayed.   Basic Metabolic Panel: Recent Labs  Lab 11/17/20 0815 11/18/20 0227 11/19/20 0543 11/20/20 0504 11/21/20 0510 11/22/20 0709 11/23/20 0333  NA 139 143 141 142 140 141 142  K 4.1 4.4 4.6 5.0 4.9 5.1 4.8  CL 102 106 107 107 108 107 105  CO2 28 26 25 26 25 25 26   GLUCOSE 175* 194* 136* 132* 133* 124* 133*  BUN 97* 95* 79* 81* 71* 65* 72*  CREATININE 4.24* 3.89* 3.36* 3.32* 3.38* 3.54* 3.56*  CALCIUM 8.5* 8.5* 8.8* 9.0 8.6* 8.8* 8.8*  MG 2.1 2.0 2.1 2.1 1.9 2.0 1.8  PHOS 4.9* 3.8 3.8 4.0  --   --   --    GFR: Estimated Creatinine Clearance: 17.8 mL/min (A) (by C-G formula based on SCr of 3.56 mg/dL (H)). Liver Function Tests: Recent Labs  Lab 11/17/20 0815 11/18/20 0227 11/19/20 0543 11/20/20 0504  AST 19 19 19 20   ALT 18 15 16 16   ALKPHOS 66 54 57 58  BILITOT 0.7 0.8 0.8 0.6  PROT 5.8* 5.0* 4.9* 5.5*  ALBUMIN 2.3* 2.0* 2.1* 2.3*   No results for input(s):  LIPASE, AMYLASE in the last 168 hours. No results for input(s): AMMONIA in the last 168 hours. Coagulation Profile: Recent Labs  Lab 11/22/20 0709  INR 1.3*   Cardiac Enzymes: No results for input(s): CKTOTAL, CKMB, CKMBINDEX, TROPONINI in the last 168 hours. BNP (last 3 results) No results for input(s): PROBNP in the last 8760 hours. HbA1C: No results for input(s): HGBA1C in the last 72 hours. CBG: Recent Labs  Lab 11/22/20 1543 11/22/20 2058 11/23/20 0822 11/23/20 1125 11/23/20 1618  GLUCAP 139* 140* 122* 157* 120*   Lipid Profile: No results for input(s): CHOL, HDL, LDLCALC, TRIG, CHOLHDL, LDLDIRECT in the last 72 hours. Thyroid Function Tests: No results for input(s): TSH, T4TOTAL, FREET4, T3FREE, THYROIDAB in the last 72 hours. Anemia Panel: No results for input(s): VITAMINB12, FOLATE, FERRITIN, TIBC, IRON, RETICCTPCT in the last 72 hours. Sepsis Labs: Recent Labs  Lab 11/21/20 0510  PROCALCITON 0.10    No results found for this or any previous visit (from the past 240 hour(s)).     Radiology Studies: PERIPHERAL VASCULAR CATHETERIZATION  Result Date: 11/22/2020 See surgical note for result.    Scheduled Meds:  sodium chloride   Intravenous Once   [START ON 11/24/2020] allopurinol  100 mg Per Tube Daily   apixaban  5 mg Oral BID   vitamin C  500 mg Per Tube BID   atorvastatin  40 mg Oral Daily   budesonide (PULMICORT) nebulizer solution  0.5 mg Nebulization BID   [START ON 11/24/2020] diltiazem  360 mg Oral Daily   diltiazem  10 mg Intravenous Once   diltiazem  90 mg Oral Q6H   docusate  100 mg Per Tube BID   feeding supplement  237 mL Oral TID BM   ferrous gluconate  324 mg Oral Q breakfast   insulin aspart  0-6 Units Subcutaneous TID WC   levothyroxine  100 mcg Per Tube Q0600   mouth rinse  15 mL Mouth Rinse BID   metoprolol tartrate  100 mg Oral BID   multivitamin with minerals  1 tablet Oral Daily   pantoprazole (PROTONIX) IV  40 mg  Intravenous Daily   polyethylene glycol  17 g Per Tube Daily   sodium chloride flush  3 mL Intravenous Q12H   sodium chloride flush  3 mL Intravenous Q12H  venlafaxine  100 mg Per Tube BID   Continuous Infusions:  sodium chloride 10 mL/hr (11/23/20 1022)   sodium chloride     sodium chloride     ampicillin-sulbactam (UNASYN) IV 3 g (11/23/20 1023)     LOS: 14 days     Enzo Bi, MD [facility] If 7PM-7AM, please contact night-coverage 11/23/2020, 4:56 PM

## 2020-11-23 NOTE — Progress Notes (Signed)
Patient wound vac changed as per orders to bottom of foot.  Wound on top of foot dressing changed as per orders. Patient tolerated well.

## 2020-11-23 NOTE — Progress Notes (Signed)
Physical Therapy Treatment Patient Details Name: Autumn [name] MRN: [id number] DOB: 1951-01-08 Today's Date: 11/23/2020   History of Present Illness 70 yo F admitted 10/29 s/p falls at home, worsening left foot chronic diabetic ulcer with osteomyelitis 2nd+3rd metatarsals and cellulitis noted on MRI, s/p L foot amputation of the 2nd metatarsal and toe, I&D deep abscess multiple fascial planes, and bone biopsy open deep third metatarsal on 10/31. Other comorbidities include sepsis without shock secondary to osteomyelitis and cellulitis left foot (IV zosyn), AKI on CKD, rapid afib (Cardizem + Heparin gtt), NSTEMI suspect demand ischemia, anemia acute on chronic. Transferred to ICU and intubated 11/2 after evolving neuro changes with unresponsiveness after coughing episode. 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    PT Comments    Pt was long sitting in bed with untouched lunch tray in front of her. She is visibly SOB. Upon further examination. O2 [place] was disconnected from wall. Even prior to replacing O2 sao2 >88% however pt has labored breathing. She endorses needing to return to bipap however with encouragement was willing to stay on 2 L o2 [place]. Pt is alert and conversational throughout however disoriented. When asked orientation questions she reports wrong month, wrong hospital, and wrong day of week. She did however correctly state reason for being in hospital. Several times throughout session, pt requires cueing to stop talking to control SOB. She was able to sit EOB x ~ 10 minutes while performing several exercises but continues to have SOB and request to return to bed. Pt is extremely deconditioned. Will require +2 assistance for any/all OOB activity. HR was much improved from earlier in the day with peak HR during session at 122bpm. She was encouraged to eat but states she was not hungry.  Going forward, pt will require a lot of PT to achieve PLOF. Acute PT will continue to follow and progress as able per current POC, updating recs as appropriate.     Recommendations for follow up therapy are one component of a multi-disciplinary discharge planning process, led by the attending physician.  Recommendations may be updated based on patient status, additional functional criteria and insurance authorization.  Follow Up Recommendations  Acute inpatient rehab (3hours/day)     Assistance Recommended at Discharge Frequent or constant Supervision/Assistance  Equipment Recommendations  Other (comment) (ongoing assessment)       Precautions / Restrictions Precautions Precautions: Fall Restrictions Weight Bearing Restrictions: Yes LLE Weight Bearing: Non weight bearing     Mobility  Bed Mobility Overal bed mobility: Needs Assistance Bed Mobility: Supine to Sit;Sit to Supine     Supine to sit: HOB elevated;Min assist;Mod assist Sit to supine: Max assist        Transfers    General transfer comment: Pt is SOB with only static sitting EOB. will require +2 assist to safely stand. pt performed EOB ther ex. author elected not to stand patient due to breathing and cardiac concerns    Ambulation/Gait    General Gait Details: currently unable     Balance Overall balance assessment: Needs assistance Sitting-balance support: Bilateral upper extremity supported (RLE support) Sitting balance-Leahy Scale: Fair Sitting balance - Comments: sat EOB x ~ 10 minutes while performing ther ex EOB       Cognition Arousal/Alertness: Awake/alert Behavior During Therapy: WFL for tasks assessed/performed Overall Cognitive Status: Impaired/Different from baseline Area of Impairment: Orientation;Memory;Following commands;Safety/judgement;Problem solving      Orientation  Level: Disoriented to;Time;Situation;Place (states its sunday, october, and that she is at [employer] in [place]  (actually saturday, november, and [facility]))   Memory: Decreased recall of precautions;Decreased short-term memory Following Commands: Follows one step commands inconsistently;Follows one step commands with increased time Safety/Judgement: Decreased awareness of safety;Decreased awareness of deficits Awareness: Intellectual Problem Solving: Slow processing;Requires verbal cues;Requires tactile cues General Comments: Pt is alert however disoriented. Sjhe is conversational throughout but needs cues to stop talking to control SOB. very minimal activity tolerance.        Exercises General Exercises - Lower Extremity Ankle Circles/Pumps: AROM;10 reps Quad Sets: AROM;10 reps Gluteal Sets: AROM;10 reps Heel Slides: Right;10 reps Straight Leg Raises: AROM;10 reps        Pertinent Vitals/Pain Pain Assessment: No/denies pain Pain Score: 0-No pain Breathing: occasional labored breathing, short period of hyperventilation Negative Vocalization: none Facial Expression: sad, frightened, frown Body Language: tense, distressed pacing, fidgeting Consolability: distracted or reassured by voice/touch PAINAD Score: 4 Facial Expression: Tense     PT Goals (current goals can now be found in the care plan section) Acute Rehab PT Goals Patient Stated Goal: none stated Progress towards PT goals: Progressing toward goals    Frequency    7X/week      PT Plan Current plan remains appropriate    Co-evaluation     PT goals addressed during session: Mobility/safety with mobility;Balance;Strengthening/ROM        AM-PAC PT "6 Clicks" Mobility   Outcome Measure  Help needed turning from your back to your side while in a flat bed without using bedrails?: A Little Help needed moving from lying on your back to sitting on the side of a flat bed without using bedrails?: A Lot Help needed moving to and from a bed to a chair (including a wheelchair)?: Total Help needed standing up from a chair using  your arms (e.g., wheelchair or bedside chair)?: Total Help needed to walk in hospital room?: Total Help needed climbing 3-5 steps with a railing? : Total 6 Click Score: 9    End of Session Equipment Utilized During Treatment: Oxygen (pt's O2 was disconnected from wall upon arriving. SOB noted even with O2 reapplied. sao2 > 88% even without O2 upon arriving however pt is SOB throughout. HR was much imporved since earlier in day but still elevated at 115 at rest.) Activity Tolerance: Patient limited by fatigue;Other (comment) (SOB) Patient left: in bed;with bed alarm set;with call bell/phone within reach Nurse Communication: Mobility status PT Visit Diagnosis: Other abnormalities of gait and mobility (R26.89);Hemiplegia and hemiparesis;Muscle weakness (generalized) (M62.81);History of falling (Z91.81) Hemiplegia - Right/Left: Right Hemiplegia - dominant/non-dominant: Dominant     Time: 3976-7341 PT Time Calculation (min) (ACUTE ONLY): 20 min  Charges:  $Therapeutic Activity: 8-22 mins                     Julaine Fusi PTA 11/23/20, 1:43 PM

## 2020-11-23 NOTE — Progress Notes (Signed)
   PODIATRY PROGRESS NOTE  NAME ADREANNA FICKEL MRN [id number] DOB June 23, 1950 DOA 11/09/2020   CC: Diabetic foot infection LT Chief Complaint  Patient presents with   Fall     History of present illness: 70 y.o. female patient postop day 12 s/p Incision and drainage. 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 LT. DOS: 11/11/2020.  Overall continues to improve progressively  Past Medical History:  Diagnosis Date   Diabetes mellitus without complication ([place])    Hypertension    Kidney stone    Thyroid disease     CBC Latest Ref Rng & Units 11/23/2020 11/22/2020 11/21/2020  WBC 4.0 - 10.5 K/uL 9.2 11.9(H) 12.6(H)  Hemoglobin 12.0 - 15.0 g/dL 8.4(L) 8.5(L) 8.4(L)  Hematocrit 36.0 - 46.0 % 27.3(L) 27.6(L) 26.9(L)  Platelets 150 - 400 K/uL 123(L) 135(L) 113(L)    BMP Latest Ref Rng & Units 11/23/2020 11/22/2020 11/21/2020  Glucose 70 - 99 mg/dL 133(H) 124(H) 133(H)  BUN 8 - 23 mg/dL 72(H) 65(H) 71(H)  Creatinine 0.44 - 1.00 mg/dL 3.56(H) 3.54(H) 3.38(H)  Sodium 135 - 145 mmol/L 142 141 140  Potassium 3.5 - 5.1 mmol/L 4.8 5.1 4.9  Chloride 98 - 111 mmol/L 105 107 108  CO2 22 - 32 mmol/L 26 25 25   Calcium 8.9 - 10.3 mg/dL 8.8(L) 8.8(L) 8.6(L)         Physical Exam:   Sutures intact dorsal.  Exposed tendon proximally. No purulence.  No malodor.  Moderate serosanguineous drainage. No active bleeding. Minimal strikethrough.  Wound VAC on operating on the plantar portion    ASSESSMENT/PLAN OF CARE 1.  S/P second ray amputation. I&D LT foot. DOS: 11/11/2020 - Continue negative pressure wound therapy and daily dressing changes -From our standpoint we have no further surgical plans at this point.  Anticoagulation can proceed as far as we are concerned when cleared by neurology and medicine -She should follow-up at the [facility] wound care center with a referral to their or with her prior podiatrist Dr. Janus Molder at [facility] -We will continue to follow intermittently, please let us know if you have any  questions or concerns regarding her left foot   Lanae Crumbly, DPM 11/23/2020     [address] [place], [place] [address]                Office [phone]  Fax [phone]

## 2020-11-23 NOTE — Progress Notes (Signed)
Progress Note  Patient Name: Autumn [name] Date of Encounter: 11/23/2020  Primary Cardiologist: Nelva Bush, MD  Subjective   Used BiPAP overnight.  Now on nasal cannula.  Strength improving.  No chest pain.  Still becomes dyspneic with minimal activity in bed.  Heart rates mostly 90s overnight though higher this morning in the setting of activity.  Inpatient Medications    Scheduled Meds:  sodium chloride   Intravenous Once   allopurinol  200 mg Per Tube Daily   apixaban  5 mg Oral BID   vitamin C  500 mg Per Tube BID   budesonide (PULMICORT) nebulizer solution  0.5 mg Nebulization BID   Chlorhexidine Gluconate Cloth  6 each Topical Daily   diltiazem  10 mg Intravenous Once   diltiazem  90 mg Oral Q6H   docusate  100 mg Per Tube BID   feeding supplement  237 mL Oral TID BM   ferrous gluconate  324 mg Oral Q breakfast   insulin aspart  0-6 Units Subcutaneous TID WC   levothyroxine  100 mcg Per Tube Q0600   mouth rinse  15 mL Mouth Rinse BID   metoprolol tartrate  50 mg Oral Q6H   multivitamin with minerals  1 tablet Oral Daily   pantoprazole (PROTONIX) IV  40 mg Intravenous Daily   polyethylene glycol  17 g Per Tube Daily   sodium chloride flush  3 mL Intravenous Q12H   sodium chloride flush  3 mL Intravenous Q12H   venlafaxine  100 mg Per Tube BID   Continuous Infusions:  sodium chloride 5 mL/hr at 11/18/20 0700   sodium chloride     sodium chloride     ampicillin-sulbactam (UNASYN) IV 3 g (11/22/20 1957)   PRN Meds: sodium chloride, sodium chloride, sodium chloride, [DISCONTINUED] acetaminophen **OR** acetaminophen, acetaminophen, albuterol, diltiazem, hydrALAZINE, HYDROmorphone (DILAUDID) injection, meclizine, ondansetron **OR** ondansetron (ZOFRAN) IV, ondansetron (ZOFRAN) IV, sodium chloride flush, sodium chloride flush   Vital Signs    Vitals:   11/23/20 0114 11/23/20 0331 11/23/20 0821 11/23/20 0828  BP: 133/89 (!) 159/98 (!) 155/132   Pulse: (!)  54 80 (!) 110 (!) 103  Resp: 16 18 14 12   Temp: 98.1 F (36.7 C) 98.1 F (36.7 C) 97.6 F (36.4 C)   TempSrc:      SpO2: 99% 100% 100% 100%  Weight:      Height:        Intake/Output Summary (Last 24 hours) at 11/23/2020 0956 Last data filed at 11/23/2020 0400 Gross per 24 hour  Intake 957.79 ml  Output 1050 ml  Net -92.21 ml   Filed Weights   11/19/20 0412 11/20/20 0338 11/21/20 0454  Weight: 95.6 kg 95.8 kg 97.9 kg    Physical Exam   GEN: obese, in no acute distress.  HEENT: Grossly normal.  Neck: Supple, obese, difficult to gauge JVP.  No carotid bruits, or masses. Cardiac: Irregularly irregular, mildly tachycardic, distant, no murmurs, rubs, or gallops. No clubbing, cyanosis, trace bilateral ankle edema.  Radials 2+, DP/PT 1+ and equal bilaterally.  Respiratory:  Respirations regular and unlabored, diminished breath sounds throughout. GI: Obese, soft, nontender, nondistended, BS + x 4. MS: no deformity or atrophy. Skin: warm and dry, no rash. Neuro:  Strength and sensation are intact. Psych: AAOx3.  Normal affect.  Labs    Chemistry Recent Labs  Lab 11/18/20 0227 11/19/20 0543 11/20/20 0504 11/21/20 0510 11/22/20 0709 11/23/20 0333  NA 143 141 142 140 141 142  K 4.4 4.6 5.0 4.9 5.1 4.8  CL 106 107 107 108 107 105  CO2 26 25 26 25 25 26   GLUCOSE 194* 136* 132* 133* 124* 133*  BUN 95* 79* 81* 71* 65* 72*  CREATININE 3.89* 3.36* 3.32* 3.38* 3.54* 3.56*  CALCIUM 8.5* 8.8* 9.0 8.6* 8.8* 8.8*  PROT 5.0* 4.9* 5.5*  --   --   --   ALBUMIN 2.0* 2.1* 2.3*  --   --   --   AST 19 19 20   --   --   --   ALT 15 16 16   --   --   --   ALKPHOS 54 57 58  --   --   --   BILITOT 0.8 0.8 0.6  --   --   --   GFRNONAA 12* 14* 15* 14* 13* 13*  ANIONGAP 11 9 9 7 9 11      Hematology Recent Labs  Lab 11/21/20 0510 11/22/20 0709 11/23/20 0333  WBC 12.6* 11.9* 9.2  RBC 2.84* 2.87* 2.85*  HGB 8.4* 8.5* 8.4*  HCT 26.9* 27.6* 27.3*  MCV 94.7 96.2 95.8  MCH 29.6 29.6  29.5  MCHC 31.2 30.8 30.8  RDW 17.2* 17.6* 17.8*  PLT 113* 135* 123*    Cardiac Enzymes  Recent Labs  Lab 11/09/20 1220 11/09/20 1818 11/10/20 0115 11/10/20 0649 11/21/20 0510  TROPONINIHS 147* 151* 162* 149* 81*      BNP Recent Labs  Lab 11/21/20 0510  BNP 589.6*     Lipids  Lab Results  Component Value Date   CHOL 101 11/10/2020   HDL 21 (L) 11/10/2020   LDLCALC 50 11/10/2020   TRIG 213 (H) 11/14/2020   CHOLHDL 4.8 11/10/2020    HbA1c  Lab Results  Component Value Date   HGBA1C 6.3 (H) 11/09/2020    Radiology    CT HEAD WO CONTRAST (5MM)  Result Date: 11/21/2020 CLINICAL DATA:  Altered mental status EXAM: CT HEAD WITHOUT CONTRAST TECHNIQUE: Contiguous axial images were obtained from the base of the skull through the vertex without intravenous contrast. COMPARISON:  Previous studies including the examination of 11/19/2020 FINDINGS: Brain: There are no signs of bleeding within the cranium. There is small area of encephalomalacia in the left frontal cortex suggests recent infarct. There is no focal mass effect. Cortical sulci are prominent. There is decreased density in periventricular and subcortical white matter. Vascular: Unremarkable. Skull: Unremarkable. Sinuses/Orbits: Unremarkable. Other: None IMPRESSION: There are no signs of bleeding within the cranium. No significant changes are noted in recent infarct in the left frontal cortex. Atrophy. Small-vessel disease. Electronically Signed   By: Elmer Picker M.D.   On: 11/21/2020 09:40   CT HEAD WO CONTRAST (5MM)  Result Date: 11/19/2020 CLINICAL DATA:  Stroke follow-up.  Rule out hemorrhage. EXAM: CT HEAD WITHOUT CONTRAST TECHNIQUE: Contiguous axial images were obtained from the base of the skull through the vertex without intravenous contrast. COMPARISON:  Brain MRI dated 11/19/2020.  Head CT dated 11/13/2020. FINDINGS: Brain: Mild age-related atrophy and chronic microvascular ischemic changes. Focal area of  white matter hypodensity in the left frontal convexity noted which appears more conspicuous compared to the CT of 11/13/2020. Or there is no acute intracranial hemorrhage. No mass effect midline shift no extra-axial fluid collection. Vascular: No hyperdense vessel or unexpected calcification. Skull: Normal. Negative for fracture or focal lesion. Sinuses/Orbits: No acute finding. Other: None IMPRESSION: 1. No acute intracranial hemorrhage. 2. Mild age-related atrophy and chronic microvascular ischemic changes.  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 Electronically Signed   By: Anner Crete M.D.   On: 11/19/2020 22:35   MR ANGIO HEAD WO CONTRAST  Result Date: 11/19/2020 CLINICAL DATA:  Stroke, follow up. EXAM: MRA NECK WITHOUT CONTRAST MRA HEAD WITHOUT CONTRAST TECHNIQUE: Angiographic images of the Circle of Willis were acquired using MRA technique without intravenous contrast. COMPARISON:  None. FINDINGS: MRA NECK FINDINGS The study is degraded by motion, particularly at the skull base. Luminal irregularity along the petrous segment of the bilateral internal carotid arteries with apparent high-grade stenosis, likely artifactual. Mild luminal irregularity of the cavernous segment of the bilateral internal carotid arteries. Normal caliber and flow related enhancement of the bilateral ACA vascular trees. Attenuation of flow in the M3/MCA branches bilaterally is likely artifactual. Normal caliber and flow related enhancement of the visualized portions of the V4 segment of the bilateral vertebral arteries. The basilar artery and bilateral posterior cerebral arteries are maintained. MRA HEAD FINDINGS The study is severely degraded by motion, particularly at the level of the carotid bifurcations. Normal flow related enhancement seen in the visualized portions of the bilateral common carotid artery. Attenuation of flow at the level of the carotid bulbs and in the distal cervical segment may  be artifactual. Note is made of retropharyngeal course of the bilateral common carotid arteries and proximal cervical internal carotid arteries. Normal caliber and flow related enhancement of the visualized V2 segment of the bilateral vertebral arteries. Attenuation of flow at the V3 segment is artifactual. IMPRESSION: 1. Motion degraded study significantly limiting evaluation. Consider repeat study or CT angiogram of the head and neck when clinically appropriate. 2. Luminal irregularity with high-grade stenosis in the petrous segment of the bilateral internal carotid arteries are likely artifactual. Likewise, attenuation of flow in the bilateral M3/MCA segments are likely artifactual. However, superimposed atherosclerotic stenosis cannot be excluded. 3. 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 4. Normal caliber of the visualized portions of the common carotid arteries and proximal carotid bifurcation with attenuation of flow in the bulbs and in the distal cervical segments, likely artifactual. Electronically Signed   By: Pedro Earls M.D.   On: 11/19/2020 15:41   MR ANGIO NECK WO CONTRAST  Result Date: 11/19/2020 CLINICAL DATA:  Stroke, follow up. EXAM: MRA NECK WITHOUT CONTRAST MRA HEAD WITHOUT CONTRAST TECHNIQUE: Angiographic images of the Circle of Willis were acquired using MRA technique without intravenous contrast. COMPARISON:  None. FINDINGS: MRA NECK FINDINGS The study is degraded by motion, particularly at the skull base. Luminal irregularity along the petrous segment of the bilateral internal carotid arteries with apparent high-grade stenosis, likely artifactual. Mild luminal irregularity of the cavernous segment of the bilateral internal carotid arteries. Normal caliber and flow related enhancement of the bilateral ACA vascular trees. Attenuation of flow in the M3/MCA branches bilaterally is likely artifactual. Normal caliber and flow related enhancement of  the visualized portions of the V4 segment of the bilateral vertebral arteries. The basilar artery and bilateral posterior cerebral arteries are maintained. MRA HEAD FINDINGS The study is severely degraded by motion, particularly at the level of the carotid bifurcations. Normal flow related enhancement seen in the visualized portions of the bilateral common carotid artery. Attenuation of flow at the level of the carotid bulbs and in the distal cervical segment may be artifactual. Note is made of retropharyngeal course of the bilateral common carotid arteries and proximal cervical internal carotid arteries. Normal caliber and flow related enhancement  of the visualized V2 segment of the bilateral vertebral arteries. Attenuation of flow at the V3 segment is artifactual. IMPRESSION: 1. Motion degraded study significantly limiting evaluation. Consider repeat study or CT angiogram of the head and neck when clinically appropriate. 2. Luminal irregularity with high-grade stenosis in the petrous segment of the bilateral internal carotid arteries are likely artifactual. Likewise, attenuation of flow in the bilateral M3/MCA segments are likely artifactual. However, superimposed atherosclerotic stenosis cannot be excluded. 3. 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 4. Normal caliber of the visualized portions of the common carotid arteries and proximal carotid bifurcation with attenuation of flow in the bulbs and in the distal cervical segments, likely artifactual. Electronically Signed   By: Pedro Earls M.D.   On: 11/19/2020 15:41   PERIPHERAL VASCULAR CATHETERIZATION  Result Date: 11/22/2020 See surgical note for result.  DG Chest Port 1 View  Result Date: 11/21/2020 CLINICAL DATA:  Acute hypoxic respiratory failure EXAM: PORTABLE CHEST 1 VIEW COMPARISON:  11/15/2020 FINDINGS: Cardiomegaly with mild interstitial edema. Mild right infrahilar opacity, favoring edema,  although superimposed right lower lobe pneumonia is not excluded. Possible small left pleural effusion. No pneumothorax. IMPRESSION: Cardiomegaly with mild interstitial edema and possible small left pleural effusion. Mild right infrahilar opacity, favoring edema, superimposed right lower lobe pneumonia not excluded. Electronically Signed   By: Julian Hy M.D.   On: 11/21/2020 03:16    Telemetry    Atrial fibrillation with rates between 60s and low 100s though mostly trending in the 90s over the past 12 hours.- Personally Reviewed  Cardiac Studies   2D Echocardiogram 10.30.2022   1. Left ventricular ejection fraction, by estimation, is 55 to 60%. The  left ventricle has normal function. The left ventricle has no regional  wall motion abnormalities. There is mild concentric left ventricular  hypertrophy. Left ventricular diastolic  parameters are indeterminate. Elevated left ventricular end-diastolic  pressure.   2. Right ventricular systolic function is normal. The right ventricular  size is normal. There is normal pulmonary artery systolic pressure.   3. Left atrial size was severely dilated.   4. The mitral valve is normal in structure. Trivial mitral valve  regurgitation. No evidence of mitral stenosis.   5. The aortic valve is tricuspid. Aortic valve regurgitation is not  visualized. No aortic stenosis is present.   6. The inferior vena cava is normal in size with greater than 50%  respiratory variability, suggesting right atrial pressure of 3 mmHg.   7. There is a small patent foramen ovale.   Patient Profile     Autumn [name] is a 70 y.o. female with a hx of HFpEF, CKD stage IV-V, diabetes since her late 51s to early 74s with diabetic neuropathy and diabetic foot ulcer, HTN, anemia of chronic disease, hepatic cirrhosis, sleep apnea, and gout who was admitted October 2019 has had a complex hospital course in the setting of acute stroke, rapid atrial fibrillation, left  foot osteomyelitis, blood loss anemia, respiratory failure, volume overload, acute kidney injury, and sepsis.  Assessment & Plan    1.  Atrial fibrillation with rapid ventricular response: Persistent A. fib throughout hospitalization.  Rates have been elevated in the setting of ongoing illness and procedures resulting in holding of medications.  Yesterday, diltiazem increased to 90 mg every 6 hours in addition to metoprolol 50 mg every 6 hours.  For the most part, rates trended in the 90s overnight.  Slightly high this morning in the setting of  working with speech therapy.  Continue current regimen and follow heart rates throughout the day.  We will look to consolidate diltiazem and metoprolol for tomorrow.  Eliquis has been resumed.  Ideally, we will pursue rate control strategy over the next 4 weeks and can consider cardioversion at that time provided that anticoagulation is uninterrupted.  2.  Acute respiratory failure: Stable on nasal cannula this morning but has been sleeping with BiPAP.  Markedly diminished breath sounds on examination.  Nebulizer therapy per medicine team.  3.  Demand ischemia/elevated troponin: In the setting of A. fib with RVR, CKD 4, sepsis, osteomyelitis, anemia, and stroke, troponin peaked at 162.  Echo this admission showed an EF of 55 to 60%.  As previously noted, we will continue medical therapy.  Continue beta-blocker.  No aspirin in the setting of Eliquis.  We will add back statin.  She was on simvastatin at home and will switch to atorvastatin.  4.  Sepsis/osteomyelitis: Antibiotics per ID.  Vascular surgery following.  5.  Acute stroke: Presumed to be embolic in the setting of A. fib.  On Eliquis.  Strength improving.  6.  Stage IV chronic kidney disease: Relatively stable.  Nephrology following.  7.  Normocytic anemia: Stable.  Follow on Eliquis.  Signed, Murray Hodgkins, NP  11/23/2020, 9:56 AM    For questions or updates, please contact   Please consult  [email or web address] for contact info under Cardiology/STEMI.

## 2020-11-23 NOTE — Progress Notes (Signed)
Speech Language Pathology Treatment: Dysphagia;Cognitive-Linquistic  Patient Details Name: Autumn [name] MRN: [id number] DOB: 21-May-1950 Today's Date: 11/23/2020 Time: 3419-6222 SLP Time Calculation (min) (ACUTE ONLY): 65 min  Assessment / Plan / Recommendation Clinical Impression  Pt seen for ongoing assessment of swallowing w/ trials to upgrade diet. Pt was also seen for speech tx targeting Motor Speech and Dysarthria. She was alert, verbally responsive and engaged in conversation w/ SLP; Dysarthria+ impacting intelligibility of speech. Pt [place] O2 2L; missing most/all Dentition and does not wear Dentures per pt report.  Pt explained general aspiration precautions and agreed verbally to the need for following them especially sitting upright for all oral intake and using Small, single bites/sips. Pt helped to position herself in an upright sitting position. Pt fed self holding Cup trials of thin liquids w/ No overt, clinical s/s of aspiration w/ the Small sips -- audible swallows(x2); no bolus loss. No gross oropharyngeal phase deficits were noted w/ mech soft consistency trials. Overall, respiratory status remained calm and unlabored, vocal quality clear b/t trials, and no coughing during oral intake trials of upgraded consistencies. Pt did require cues intermittently for following aspiration precautions and to use Small, single sips when drinking thin liquids. Oral phase appeared grossly [facility] for bolus management, mastication, and timely A-P transfer for swallowing w/ the solid food trials despite the R lingual/oral weakness and reduced coordination; min increased Time for fully mashing/gumming mastication w/ the increased texture was needed d/t missing Dentition also. Oral clearing achieved w/ all consistencies. NSG denied any deficits in swallowing w/ current dysphagia diet. Pt does not wear Dentures and was eating a regular diet at home she reported prior to this hospitalization -- used to  gumming/mashing her foods.    Pt appears at reduced risk for aspiration when following aspiration precautions w/ the upgraded consistency diet. Recommend a Mech Soft foods diet for ease w/ gravies added to moisten foods; Thin liquids VIA CUP ONLY. Recommend aspiration precautions; Pills Whole in Puree; tray setup and positioning assistance for meals w/ assistance feeding as needed. ST services will continue to follow for toleration of diet, education while admitted. NSG updated. Precautions posted at bedside. Pt is making good progress w/ her Dysphagia.     Skilled ST services included ongoing assessment of Motor Speech deficits, Dysarthria and targeted/functional ST tasks, use of verbal/visual/tactile cues, and developed visual aid for cues to enhance speech intelligibility during exercises and general speech w/ others. Pt with overall improvement in her verbal communication as noted by notes and by staff -- this is further enhanced when she implements Dysarthria strategies. Tasks completed included: increased automatic speech/tasks, reading of single menu items, short phrase response/greetings, and ID of concrete items in room stating name/function, discussion of her Dogs at home(description, likes, play) -- all tasks w/ focus on utilizing Dysarthria strategies of increased volume, over-articulation, and slowing rate. Pt was able to increase intelligibility/accuracy in her speech responses and simple conversation w/ other staff member to be understood ~50-75% of the time. Pt seemed pleased w/ her progress stating she "wanted to move forward and not become stagnant"; use "baby steps"; and "go to Rehab".    SLP posted visual aid re: education to enhance effective communication and for Dysarthria exs for pt; discussed and reviewed w/ pt b/f posting in room. SLP recommends encouraging use of strategies, providing additional time for responses, and ask for repetition when needed for understanding but offer  encouragement. Family not present to provide education, however visual aid/handout in  room for review. Continue skilled ST services to address Motor Speech and Dysarthria deficits. Pt left with call bell in reach and all needs met prior to exiting room. Pt seemed drowsy post session. Pt could be a good candidate for Inpatient Rehab - CIR. Will discuss w/ Rehab Team.      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  She'd been following with a [facility] podiatrist outpatient and had recently refused surgery.  She'd recently been treated with antibiotics at [facility] for the diabetic foot infection with enterobacter aerogans culture positive.  She was admitted on 10/29 due to falls at home with worsening L foot chronic diabetic ulcer with osteomyelitis of the 2nd and 3rd metatarsals and cellulitis noted on MRI.  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  Her post operative course was complicated by unresponsiveness and neurologic changes on 11/13/2021. She was found to have a stroke (MRI Brain showed acute to early subacute infarction in the left posterior frontal cortical and subcortical brain, possibly affecting the precentral gyrus) and was intubated for airway protection.  She was extubated 11/3 and transferred to [facility] service on 11/4.      SLP Plan  Continue with current plan of care      Recommendations for follow up therapy are one component of a multi-disciplinary discharge planning process, led by the attending physician.  Recommendations may be updated based on patient status, additional functional criteria and insurance authorization.    Recommendations  Diet recommendations: Dysphagia 3 (mechanical soft);Thin liquid Liquids provided via: Cup;No straw Medication Administration: Whole meds with puree Supervision: Patient able to  self feed;Staff to assist with self feeding;Intermittent supervision to cue for compensatory strategies Compensations: Minimize environmental distractions;Slow rate;Small sips/bites;Lingual sweep for clearance of pocketing;Follow solids with liquid Postural Changes and/or Swallow Maneuvers: Out of bed for meals;Seated upright 90 degrees;Upright 30-60 min after meal                General recommendations:  (Dietician f/u) Oral Care Recommendations: Oral care BID;Oral care before and after PO;Staff/trained caregiver to provide oral care Follow Up Recommendations: Acute inpatient rehab (3hours/day) Assistance recommended at discharge: Intermittent Supervision/Assistance SLP Visit Diagnosis: Dysphagia, oropharyngeal phase (R13.12);Dysarthria and anarthria (R47.1) Plan: Continue with current plan of care       [place], [place], CCC-SLP Speech Language Pathologist Rehab Services [phone] [facility]  11/23/2020, 3:04 PM

## 2020-11-23 NOTE — Progress Notes (Signed)
Postop day 1 from cerebral angiogram via right common femoral access.  No vascular intervention warranted on the cerebrovascular tree.  FemoStop removed from the right femoral sheath site.  Mild ecchymosis, no pseudoaneurysm.  Please contact as needed.

## 2020-11-23 NOTE — Progress Notes (Addendum)
PT Cancellation Note  Patient Details Name: Autumn [name] MRN: [id number] DOB: [birth year]   Cancelled Treatment:     PT attempt. PT hold this AM. Pt is still on bi-pap and currently resting HR in upper 120s but in A fib. Elevated BP as well. Rn will inform [name] when/if pt becomes more appropriate to participate.    Willette Pa 11/23/2020, 9:21 AM

## 2020-11-24 DIAGNOSIS — I4891 Unspecified atrial fibrillation: Secondary | ICD-10-CM | POA: Diagnosis not present

## 2020-11-24 DIAGNOSIS — I10 Essential (primary) hypertension: Secondary | ICD-10-CM | POA: Diagnosis not present

## 2020-11-24 LAB — GLUCOSE, CAPILLARY
Glucose-Capillary: 107 mg/dL — ABNORMAL HIGH (ref 70–99)
Glucose-Capillary: 132 mg/dL — ABNORMAL HIGH (ref 70–99)

## 2020-11-24 LAB — CBC
HCT: 25 % — ABNORMAL LOW (ref 36.0–46.0)
Hemoglobin: 7.9 g/dL — ABNORMAL LOW (ref 12.0–15.0)
MCH: 29.8 pg (ref 26.0–34.0)
MCHC: 31.6 g/dL (ref 30.0–36.0)
MCV: 94.3 fL (ref 80.0–100.0)
Platelets: 99 10*3/uL — ABNORMAL LOW (ref 150–400)
RBC: 2.65 MIL/uL — ABNORMAL LOW (ref 3.87–5.11)
RDW: 17.9 % — ABNORMAL HIGH (ref 11.5–15.5)
WBC: 9.1 10*3/uL (ref 4.0–10.5)
nRBC: 0 % (ref 0.0–0.2)

## 2020-11-24 LAB — BASIC METABOLIC PANEL
Anion gap: 12 (ref 5–15)
BUN: 66 mg/dL — ABNORMAL HIGH (ref 8–23)
CO2: 23 mmol/L (ref 22–32)
Calcium: 8.7 mg/dL — ABNORMAL LOW (ref 8.9–10.3)
Chloride: 105 mmol/L (ref 98–111)
Creatinine, Ser: 3.62 mg/dL — ABNORMAL HIGH (ref 0.44–1.00)
GFR, Estimated: 13 mL/min — ABNORMAL LOW (ref >60)
Glucose, Bld: 113 mg/dL — ABNORMAL HIGH (ref 70–99)
Potassium: 4.6 mmol/L (ref 3.5–5.1)
Sodium: 140 mmol/L (ref 135–145)

## 2020-11-24 LAB — MAGNESIUM: Magnesium: 2 mg/dL (ref 1.7–2.4)

## 2020-11-24 MED ORDER — ALLOPURINOL 100 MG PO TABS
100.0000 mg | ORAL_TABLET | Freq: Every day | ORAL | Status: DC
  Administered 2020-11-25 – 2020-11-26 (×2): 100 mg via ORAL
  Filled 2020-11-24 (×2): qty 1

## 2020-11-24 MED ORDER — HYDRALAZINE HCL 50 MG PO TABS
50.0000 mg | ORAL_TABLET | Freq: Three times a day (TID) | ORAL | Status: DC
  Administered 2020-11-24 – 2020-12-02 (×23): 50 mg via ORAL
  Filled 2020-11-24 (×23): qty 1

## 2020-11-24 MED ORDER — DOCUSATE SODIUM 50 MG/5ML PO LIQD
100.0000 mg | Freq: Two times a day (BID) | ORAL | Status: DC
  Administered 2020-11-24 – 2020-12-01 (×5): 100 mg via ORAL
  Filled 2020-11-24 (×18): qty 10

## 2020-11-24 MED ORDER — VENLAFAXINE HCL 37.5 MG PO TABS
100.0000 mg | ORAL_TABLET | Freq: Two times a day (BID) | ORAL | Status: DC
  Administered 2020-11-24 – 2020-11-26 (×4): 100 mg via ORAL
  Filled 2020-11-24 (×5): qty 1

## 2020-11-24 MED ORDER — HYDRALAZINE HCL 25 MG PO TABS
25.0000 mg | ORAL_TABLET | Freq: Three times a day (TID) | ORAL | Status: DC
  Administered 2020-11-24: 25 mg via ORAL
  Filled 2020-11-24: qty 1

## 2020-11-24 MED ORDER — POLYETHYLENE GLYCOL 3350 17 G PO PACK
17.0000 g | PACK | Freq: Every day | ORAL | Status: DC
  Administered 2020-11-26 – 2020-11-29 (×3): 17 g via ORAL
  Filled 2020-11-24 (×4): qty 1

## 2020-11-24 MED ORDER — LEVOTHYROXINE SODIUM 100 MCG PO TABS
100.0000 ug | ORAL_TABLET | Freq: Every day | ORAL | Status: DC
  Administered 2020-11-25 – 2020-12-21 (×27): 100 ug via ORAL
  Filled 2020-11-24 (×28): qty 1

## 2020-11-24 MED ORDER — ASCORBIC ACID 500 MG PO TABS
500.0000 mg | ORAL_TABLET | Freq: Two times a day (BID) | ORAL | Status: DC
  Administered 2020-11-24 – 2020-12-21 (×52): 500 mg via ORAL
  Filled 2020-11-24 (×52): qty 1

## 2020-11-24 NOTE — Progress Notes (Addendum)
PROGRESS NOTE    Autumn [name]  OHY:[id number] DOB: [birth year] DOA: 11/09/2020 PCP: Harlow Ohms, MD  [id number]   Assessment & Plan:   Principal Problem:   Sepsis [facility]) Active Problems:   Atrial fibrillation with RVR ([place])   Osteomyelitis of foot, left, acute ([place])   CKD stage 4 due to type 2 diabetes mellitus ([place])   Liver cirrhosis secondary to NASH (nonalcoholic steatohepatitis) ([place])   Hypertension   History of anemia due to CKD   Frequent falls   AKI (acute kidney injury) ([place])   Cellulitis and abscess of foot, except toes   Infectious tenosynovitis   Wheeze   Atrial fibrillation with rapid ventricular response (HCC)   Bilateral carotid artery stenosis   70 yo F with history of T2DM, CKD, 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  She'd been following with a [facility] podiatrist outpatient and had recently refused surgery.  She'd recently been treated with antibiotics at [facility] for the diabetic foot infection with enterobacter aerogans culture positive.  She was admitted on 10/29 due to falls at home with worsening L foot chronic diabetic ulcer with osteomyelitis of the 2nd and 3rd metatarsals and cellulitis noted on MRI.  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  Her post operative course was complicated by unresponsiveness and neurologic changes, she was found to have a stroke and was intubated for airway protection.  She was extubated 11/3 and transferred to [facility] service on 11/4.   Acute Blood Loss Anemia Iron def Anemia of CKD In setting of heparin and recent surgical procedure - postop bleeding from surgical wound --s/p 2 units pRBC, Hgb stable in 8's. Labs with iron def, vit B12 elevated, folate wnl Plan: --monitor Hgb --cont iron suppl --Epo as outpatient, per nephro   Stroke Expressive aphasia, improved Neurology suspects  periprocedural vs related to atrial fibrillation MRI brian with acute to early subacute infarction in the L posterior frontal cortical and subcortical brain, possibly affecting the precentral gyrus.   --Echo with EF 55-60%, dilated LA, patent foramen ovale --carotid US right ICA artery worrisome for a subtotal occlusion.  Moderate to large amount of L sided atheroscelotic plaque, however, Left carotid angiogram with Dr. Delana Meyer  showed no significant stenosis. Plan: --cont Eliquis --cont statin   Acute Metabolic Encephalopathy Related to acute infection and above Delirium precautions Follow and w/u further as indicated   Septic Shock Diabetic Foot Infection Left Foot Osteomyelitis and Cellulitis  Septic Arthritis MRI foot with septic arthritis of second MTP joint and osteomyelitis of 2nd metatarsal head and second proximal phalanx, severe soft tissue swelling around second metatarsal neck concerning for phlegmon developing abscess measuring 2.1x1 cm circumferentially around distal shaft.  Mild early osteomyelitis of 3rd metatarsal head.  Severe soft tissue edema of the forefoot c/w cellulitis. MRI ankle limited -> interval resection of 2nd metatarsal, longitudinal tear of peroneus brevis with peroneus tenosynovitis, distal tibialis posterior tenosynovitis and tendinopathy, extensor digitorum tenosynovitis.  Extensive subcutaneous edema circumferentially in distal calf.  Dorsal subcutaneous edema in foot. S/p 2nd ray amputation, I&D, and bone bx of 3rd metatarsal on 11/1 --ID consulted Plan: --cont IV unasyn for 4 weeks until 12/09/20 -followed by PO augmentin until 12/23/20 --NWB LLE --cont wound VAC --ID followup as outpatient   Acute Hypoxic Respiratory Failure Aspiration Pneumonia, treated Pulm edema 2/2 fluid overload CXR 11/3 with right  lung opacity concerning for superimposed infection - also with findings concerning for pulmonary edema and small right effusion --aspiration PNA  considered treated with IV Unasyn --rapid called early morning 11/10 for respiratory distress.  CXR showed pulm edema (pt had received MIVF for 4 days before I d/c'ed it yesterday).  Started on BiPAP, and received IV lasix 40 x1, 80 x1, metolazone x1 --currently on 2L, BiPAP at night Plan: --Continue supplemental O2 to keep sats >=90%, wean as tolerated  Atrial Fibrillation with RVR --stayed in RVR due to meds on hold --cardiology following Plan: --converted dilt to Cardizem CD 360 mg daily, per cardio --cont Lopressor to 100 mg BID, per cardio (added holding parameter for BP) --cont Eliquis   Thrombocytopenia --HIT ab neg.   Dysphagia 2/2 stroke --SLP following --cont Dys 3, thin liquid   AKI on CKD IV Baseline creatinine around 3.2 --nephrology following   Elevated Troponin Cardiology following, thought 2/2 demand   Hx NASH cirrhosis --compensated    Hypothyroidism --cont Synthroid   T2DM A1c 6.3 --BG has been within inpatient goal, has not needed SSI. --no need for BG checks and SSI   DVT prophylaxis: [id number] Code Status: Full code  Family Communication:  Level of care: Progressive Cardiac Dispo:   The patient is from: home Anticipated d/c is to: [facility] Anticipated d/c date is: 1-2 days Patient currently is not medically ready to d/c due to: still needing BiPAP intermittently, per RN   Subjective and Interval History:  Pt was worn out from just having a BM.   No foot pain.  Pt was fixated on wanting to go home now.     Objective: Vitals:   11/24/20 0538 11/24/20 0754 11/24/20 0826 11/24/20 1327  BP: (!) 160/80  (!) 183/90 (!) 165/88  Pulse: (!) 52 (!) 53 (!) 59 65  Resp: 18 15 19 20   Temp:   98.4 F (36.9 C) 98.7 F (37.1 C)  TempSrc:      SpO2: 97% 98% 100% 100%  Weight:      Height:        Intake/Output Summary (Last 24 hours) at 11/24/2020 1509 Last data filed at 11/24/2020 1417 Gross per 24 hour  Intake 515 ml   Output 650 ml  Net -135 ml    Filed Weights   11/19/20 0412 11/20/20 0338 11/21/20 0454  Weight: 95.6 kg 95.8 kg 97.9 kg    Examination:   Constitutional: NAD, alert, oriented to person and place HEENT: conjunctivae and lids normal, EOMI CV: No cyanosis.   RESP: increased RR, on 2L Extremities: LLE swelling improved, left foot wrapped and connected to wound vac SKIN: warm, dry Neuro: II - XII grossly intact.     Data Reviewed: I have personally reviewed following labs and imaging studies  CBC: Recent Labs  Lab 11/18/20 0227 11/18/20 1237 11/19/20 0543 11/20/20 0504 11/21/20 0510 11/22/20 0709 11/23/20 0333 11/24/20 0500  WBC 10.6*  --  12.5* 10.7* 12.6* 11.9* 9.2 9.1  NEUTROABS 8.2*  --  9.6* 7.9*  --   --   --   --   HGB 6.4*   < > 8.5* 8.4* 8.4* 8.5* 8.4* 7.9*  HCT 20.5*   < > 26.6* 26.5* 26.9* 27.6* 27.3* 25.0*  MCV 89.1  --  91.4 93.3 94.7 96.2 95.8 94.3  PLT 124*  --  104* 108* 113* 135* 123* 99*   < > = values in this interval not displayed.   Basic Metabolic Panel: Recent  Labs  Lab 11/18/20 0227 11/19/20 0543 11/20/20 0504 11/21/20 0510 11/22/20 0709 11/23/20 0333 11/24/20 0500  NA 143 141 142 140 141 142 140  K 4.4 4.6 5.0 4.9 5.1 4.8 4.6  CL 106 107 107 108 107 105 105  CO2 26 25 26 25 25 26 23   GLUCOSE 194* 136* 132* 133* 124* 133* 113*  BUN 95* 79* 81* 71* 65* 72* 66*  CREATININE 3.89* 3.36* 3.32* 3.38* 3.54* 3.56* 3.62*  CALCIUM 8.5* 8.8* 9.0 8.6* 8.8* 8.8* 8.7*  MG 2.0 2.1 2.1 1.9 2.0 1.8 2.0  PHOS 3.8 3.8 4.0  --   --   --   --    GFR: Estimated Creatinine Clearance: 17.5 mL/min (A) (by C-G formula based on SCr of 3.62 mg/dL (H)). Liver Function Tests: Recent Labs  Lab 11/18/20 0227 11/19/20 0543 11/20/20 0504  AST 19 19 20   ALT 15 16 16   ALKPHOS 54 57 58  BILITOT 0.8 0.8 0.6  PROT 5.0* 4.9* 5.5*  ALBUMIN 2.0* 2.1* 2.3*   No results for input(s): LIPASE, AMYLASE in the last 168 hours. No results for input(s): AMMONIA in the  last 168 hours. Coagulation Profile: Recent Labs  Lab 11/22/20 0709  INR 1.3*   Cardiac Enzymes: No results for input(s): CKTOTAL, CKMB, CKMBINDEX, TROPONINI in the last 168 hours. BNP (last 3 results) No results for input(s): PROBNP in the last 8760 hours. HbA1C: No results for input(s): HGBA1C in the last 72 hours. CBG: Recent Labs  Lab 11/23/20 0822 11/23/20 1125 11/23/20 1618 11/24/20 0825 11/24/20 1304  GLUCAP 122* 157* 120* 107* 132*   Lipid Profile: No results for input(s): CHOL, HDL, LDLCALC, TRIG, CHOLHDL, LDLDIRECT in the last 72 hours. Thyroid Function Tests: No results for input(s): TSH, T4TOTAL, FREET4, T3FREE, THYROIDAB in the last 72 hours. Anemia Panel: No results for input(s): VITAMINB12, FOLATE, FERRITIN, TIBC, IRON, RETICCTPCT in the last 72 hours. Sepsis Labs: Recent Labs  Lab 11/21/20 0510  PROCALCITON 0.10    No results found for this or any previous visit (from the past 240 hour(s)).     Radiology Studies: No results found.   Scheduled Meds:  sodium chloride   Intravenous Once   allopurinol  100 mg Per Tube Daily   apixaban  5 mg Oral BID   vitamin C  500 mg Per Tube BID   atorvastatin  40 mg Oral Daily   budesonide (PULMICORT) nebulizer solution  0.5 mg Nebulization BID   diltiazem  360 mg Oral Daily   diltiazem  10 mg Intravenous Once   docusate  100 mg Per Tube BID   feeding supplement  237 mL Oral TID BM   ferrous gluconate  324 mg Oral Q breakfast   hydrALAZINE  25 mg Oral Q8H   levothyroxine  100 mcg Per Tube Q0600   mouth rinse  15 mL Mouth Rinse BID   metoprolol tartrate  100 mg Oral BID   multivitamin with minerals  1 tablet Oral Daily   pantoprazole (PROTONIX) IV  40 mg Intravenous Daily   polyethylene glycol  17 g Per Tube Daily   sodium chloride flush  3 mL Intravenous Q12H   sodium chloride flush  3 mL Intravenous Q12H   venlafaxine  100 mg Per Tube BID   Continuous Infusions:  sodium chloride 10 mL/hr (11/23/20  1022)   sodium chloride     sodium chloride 250 mL (11/24/20 1119)   ampicillin-sulbactam (UNASYN) IV 3 g (11/24/20 1120)  LOS: 15 days     Enzo Bi, MD [facility] If 7PM-7AM, please contact night-coverage 11/24/2020, 3:09 PM

## 2020-11-24 NOTE — Progress Notes (Addendum)
Progress Note  Patient Name: Autumn [name] Date of Encounter: 11/24/2020  Primary Cardiologist: Nelva Bush, MD  Subjective   Currently on BiPAP and complaining of severe chills .  Denies any chest pain.  Converted to normal sinus rhythm on telemetry Inpatient Medications    Scheduled Meds:  sodium chloride   Intravenous Once   allopurinol  100 mg Per Tube Daily   apixaban  5 mg Oral BID   vitamin C  500 mg Per Tube BID   atorvastatin  40 mg Oral Daily   budesonide (PULMICORT) nebulizer solution  0.5 mg Nebulization BID   diltiazem  360 mg Oral Daily   diltiazem  10 mg Intravenous Once   docusate  100 mg Per Tube BID   feeding supplement  237 mL Oral TID BM   ferrous gluconate  324 mg Oral Q breakfast   levothyroxine  100 mcg Per Tube Q0600   mouth rinse  15 mL Mouth Rinse BID   metoprolol tartrate  100 mg Oral BID   multivitamin with minerals  1 tablet Oral Daily   pantoprazole (PROTONIX) IV  40 mg Intravenous Daily   polyethylene glycol  17 g Per Tube Daily   sodium chloride flush  3 mL Intravenous Q12H   sodium chloride flush  3 mL Intravenous Q12H   venlafaxine  100 mg Per Tube BID   Continuous Infusions:  sodium chloride 10 mL/hr (11/23/20 1022)   sodium chloride     sodium chloride     ampicillin-sulbactam (UNASYN) IV 3 g (11/23/20 2217)   PRN Meds: sodium chloride, sodium chloride, sodium chloride, [DISCONTINUED] acetaminophen **OR** acetaminophen, acetaminophen, albuterol, diltiazem, hydrALAZINE, meclizine, ondansetron **OR** ondansetron (ZOFRAN) IV, ondansetron (ZOFRAN) IV, sodium chloride flush, sodium chloride flush   Vital Signs    Vitals:   11/24/20 0530 11/24/20 0538 11/24/20 0754 11/24/20 0826  BP: (!) 179/92 (!) 160/80  (!) 183/90  Pulse: (!) 54 (!) 52 (!) 53 (!) 59  Resp: 15 18 15 19   Temp: (!) 97.5 F (36.4 C)   98.4 F (36.9 C)  TempSrc: Oral     SpO2: 97% 97% 98% 100%  Weight:      Height:        Intake/Output Summary (Last  24 hours) at 11/24/2020 0916 Last data filed at 11/24/2020 0530 Gross per 24 hour  Intake 755 ml  Output 650 ml  Net 105 ml    Filed Weights   11/19/20 0412 11/20/20 0338 11/21/20 0454  Weight: 95.6 kg 95.8 kg 97.9 kg    Physical Exam   GEN: Currently on BiPAP complaining of rigors HEENT: Normal NECK: No JVD; No carotid bruits LYMPHATICS: No lymphadenopathy CARDIAC:RRR, no murmurs, rubs, gallops RESPIRATORY:  Clear to auscultation without rales, wheezing or rhonchi anteriorly ABDOMEN: Soft, non-tender, non-distended MUSCULOSKELETAL:  No edema; that is post left second metatarsal amputation SKIN: Warm and dry NEUROLOGIC:  Alert and oriented x 3 PSYCHIATRIC:  Normal affect   Labs    Chemistry Recent Labs  Lab 11/18/20 0227 11/19/20 0543 11/20/20 0504 11/21/20 0510 11/22/20 0709 11/23/20 0333 11/24/20 0500  NA 143 141 142   < > 141 142 140  K 4.4 4.6 5.0   < > 5.1 4.8 4.6  CL 106 107 107   < > 107 105 105  CO2 26 25 26    < > 25 26 23   GLUCOSE 194* 136* 132*   < > 124* 133* 113*  BUN 95* 79* 81*   < >  65* 72* 66*  CREATININE 3.89* 3.36* 3.32*   < > 3.54* 3.56* 3.62*  CALCIUM 8.5* 8.8* 9.0   < > 8.8* 8.8* 8.7*  PROT 5.0* 4.9* 5.5*  --   --   --   --   ALBUMIN 2.0* 2.1* 2.3*  --   --   --   --   AST 19 19 20   --   --   --   --   ALT 15 16 16   --   --   --   --   ALKPHOS 54 57 58  --   --   --   --   BILITOT 0.8 0.8 0.6  --   --   --   --   GFRNONAA 12* 14* 15*   < > 13* 13* 13*  ANIONGAP 11 9 9    < > 9 11 12    < > = values in this interval not displayed.      Hematology Recent Labs  Lab 11/22/20 0709 11/23/20 0333 11/24/20 0500  WBC 11.9* 9.2 9.1  RBC 2.87* 2.85* 2.65*  HGB 8.5* 8.4* 7.9*  HCT 27.6* 27.3* 25.0*  MCV 96.2 95.8 94.3  MCH 29.6 29.5 29.8  MCHC 30.8 30.8 31.6  RDW 17.6* 17.8* 17.9*  PLT 135* 123* 99*     Cardiac Enzymes  Recent Labs  Lab 11/09/20 1220 11/09/20 1818 11/10/20 0115 11/10/20 0649 11/21/20 0510  TROPONINIHS 147*  151* 162* 149* 81*       BNP Recent Labs  Lab 11/21/20 0510  BNP 589.6*      Lipids  Lab Results  Component Value Date   CHOL 101 11/10/2020   HDL 21 (L) 11/10/2020   LDLCALC 50 11/10/2020   TRIG 213 (H) 11/14/2020   CHOLHDL 4.8 11/10/2020    HbA1c  Lab Results  Component Value Date   HGBA1C 6.3 (H) 11/09/2020    Radiology    CT HEAD WO CONTRAST (5MM)  Result Date: 11/21/2020 CLINICAL DATA:  Altered mental status EXAM: CT HEAD WITHOUT CONTRAST TECHNIQUE: Contiguous axial images were obtained from the base of the skull through the vertex without intravenous contrast. COMPARISON:  Previous studies including the examination of 11/19/2020 FINDINGS: Brain: There are no signs of bleeding within the cranium. There is small area of encephalomalacia in the left frontal cortex suggests recent infarct. There is no focal mass effect. Cortical sulci are prominent. There is decreased density in periventricular and subcortical white matter. Vascular: Unremarkable. Skull: Unremarkable. Sinuses/Orbits: Unremarkable. Other: None IMPRESSION: There are no signs of bleeding within the cranium. No significant changes are noted in recent infarct in the left frontal cortex. Atrophy. Small-vessel disease. Electronically Signed   By: Elmer Picker M.D.   On: 11/21/2020 09:40   PERIPHERAL VASCULAR CATHETERIZATION  Result Date: 11/22/2020 See surgical note for result.  DG Chest Port 1 View  Result Date: 11/21/2020 CLINICAL DATA:  Acute hypoxic respiratory failure EXAM: PORTABLE CHEST 1 VIEW COMPARISON:  11/15/2020 FINDINGS: Cardiomegaly with mild interstitial edema. Mild right infrahilar opacity, favoring edema, although superimposed right lower lobe pneumonia is not excluded. Possible small left pleural effusion. No pneumothorax. IMPRESSION: Cardiomegaly with mild interstitial edema and possible small left pleural effusion. Mild right infrahilar opacity, favoring edema, superimposed right lower  lobe pneumonia not excluded. Electronically Signed   By: Julian Hy M.D.   On: 11/21/2020 03:16    Telemetry    Normal sinus rhythm.- Personally Reviewed  Cardiac Studies   2D Echocardiogram  10.30.2022   1. Left ventricular ejection fraction, by estimation, is 55 to 60%. The  left ventricle has normal function. The left ventricle has no regional  wall motion abnormalities. There is mild concentric left ventricular  hypertrophy. Left ventricular diastolic  parameters are indeterminate. Elevated left ventricular end-diastolic  pressure.   2. Right ventricular systolic function is normal. The right ventricular  size is normal. There is normal pulmonary artery systolic pressure.   3. Left atrial size was severely dilated.   4. The mitral valve is normal in structure. Trivial mitral valve  regurgitation. No evidence of mitral stenosis.   5. The aortic valve is tricuspid. Aortic valve regurgitation is not  visualized. No aortic stenosis is present.   6. The inferior vena cava is normal in size with greater than 50%  respiratory variability, suggesting right atrial pressure of 3 mmHg.   7. There is a small patent foramen ovale.   Patient Profile     JEYLIN WOODMANSEE is a 70 y.o. female with a hx of HFpEF, CKD stage IV-V, diabetes since her late 12s to early 32s with diabetic neuropathy and diabetic foot ulcer, HTN, anemia of chronic disease, hepatic cirrhosis, sleep apnea, and gout who was admitted October 2019 has had a complex hospital course in the setting of acute stroke, rapid atrial fibrillation, left foot osteomyelitis, blood loss anemia, respiratory failure, volume overload, acute kidney injury, and sepsis.  Assessment & Plan    1.  Atrial fibrillation with rapid ventricular response:  -Persistent A. fib throughout hospitalization.   -Rates have been elevated in the setting of ongoing illness and procedures resulting in holding of medications.  -Diltiazem increased to 90  mg every 6 hours in addition to metoprolol 50 mg every 6 hours.   -Eliquis has been resumed.   -Converted to normal sinus rhythm  -Continue Eliquis 5 mg twice daily.   -Cardizem has been consolidated to Cardizem CD 360 mg daily starting today -Continue Lopressor 100 mg twice daily and consolidate to Toprol XL 200 mg daily at discharge   2.  Acute respiratory failure:  -Currently on BiPAP with O2 sats 100% but complains of severe rigors this morning  -Breath sounds diminished at bases but clear anteriorly  -She remains afebrile and white cell count stable -Nebulizer therapy per medicine team.  3.  Demand ischemia/elevated troponin:  -In the setting of A. fib with RVR, CKD 4, sepsis, osteomyelitis, anemia, and stroke, troponin peaked at 162.  -Echo this admission showed an EF of 55 to 60%.   -Continue Lopressor 100 mg twice daily, atorvastatin 40 mg daily  -No aspirin due to DOAC   4.  Sepsis/osteomyelitis: Antibiotics per ID.  Vascular surgery following.  5.  Acute stroke: Presumed to be embolic in the setting of A. fib.  On Eliquis.  Strength improving.  6.  Stage IV chronic kidney disease: Relatively stable.  Nephrology following.  7.  Normocytic anemia: Stable.  Follow on Eliquis.  8.  Hypertension -BP has been poorly controlled over the past 24 hours. -He is currently off her home doses of hydralazine 25 mg 3 times daily, lisinopril 40 mg daily and clonidine 0.3 mg nightly -Will restart hydralazine 25 mg 3 times daily  Signed, Fransico Him, MD  11/24/2020, 9:16 AM    For questions or updates, please contact   Please consult [email or web address] for contact info under Cardiology/STEMI.

## 2020-11-24 NOTE — Progress Notes (Signed)
Did a breathing treatment for patient at 1600. She had slight expiratory wheezes after a gown and linen change. Oxygen saturation maintained in the high 90's. (98%)

## 2020-11-24 NOTE — Progress Notes (Signed)
PT Cancellation Note  Patient Details Name: Autumn [name] MRN: [id number] DOB: 1950/03/18   Cancelled Treatment:    Reason Eval/Treat Not Completed: Fatigue/lethargy limiting ability to participate  Attempted x 2 this am.  On both attempt pt asleep on bi-pap and did not respond to voice or light touch.  Pt allowed to sleep and will continue attempts tomorrow.   Chesley Noon 11/24/2020, 10:46 AM

## 2020-11-24 NOTE — Progress Notes (Signed)
[facility]  PROGRESS NOTE   Subjective:   Patient is awake and alert and is more responsive today.  Objective:  Vital signs in last 24 hours:  Temp:  [97.4 F (36.3 C)-98.7 F (37.1 C)] 98.7 F (37.1 C) (11/13 1327) Pulse Rate:  [45-117] 65 (11/13 1327) Resp:  [14-20] 20 (11/13 1327) BP: (130-183)/(69-113) 165/88 (11/13 1327) SpO2:  [97 %-100 %] 100 % (11/13 1327) FiO2 (%):  [30 %] 30 % (11/13 0754)  Weight change:  Filed Weights   11/19/20 0412 11/20/20 0338 11/21/20 0454  Weight: 95.6 kg 95.8 kg 97.9 kg    Intake/Output: I/O last 3 completed shifts: In: 961.2 [P.O.:842; I.V.:53; IV Piggyback:66.2] Out: 1350 [Urine:1350]   Intake/Output this shift:  No intake/output data recorded.  Physical Exam: General:  No acute distress  Head:  Normocephalic, atraumatic. Moist oral mucosal membranes  Eyes:  Anicteric  Neck:  Supple  Lungs:   Clear to auscultation, normal effort  Heart:  S1S2 no rubs  Abdomen:   Soft, nontender, bowel sounds present  Extremities:  peripheral edema.  Neurologic:  Awake, alert, following commands  Skin:  No lesions  Access:     Basic Metabolic Panel: Recent Labs  Lab 11/18/20 0227 11/19/20 0543 11/20/20 0504 11/21/20 0510 11/22/20 0709 11/23/20 0333 11/24/20 0500  NA 143 141 142 140 141 142 140  K 4.4 4.6 5.0 4.9 5.1 4.8 4.6  CL 106 107 107 108 107 105 105  CO2 26 25 26 25 25 26 23   GLUCOSE 194* 136* 132* 133* 124* 133* 113*  BUN 95* 79* 81* 71* 65* 72* 66*  CREATININE 3.89* 3.36* 3.32* 3.38* 3.54* 3.56* 3.62*  CALCIUM 8.5* 8.8* 9.0 8.6* 8.8* 8.8* 8.7*  MG 2.0 2.1 2.1 1.9 2.0 1.8 2.0  PHOS 3.8 3.8 4.0  --   --   --   --     CBC: Recent Labs  Lab 11/18/20 0227 11/18/20 1237 11/19/20 0543 11/20/20 0504 11/21/20 0510 11/22/20 0709 11/23/20 0333 11/24/20 0500  WBC 10.6*  --  12.5* 10.7* 12.6* 11.9* 9.2 9.1  NEUTROABS 8.2*  --  9.6* 7.9*  --   --   --   --   HGB 6.4*   < > 8.5* 8.4* 8.4* 8.5* 8.4* 7.9*   HCT 20.5*   < > 26.6* 26.5* 26.9* 27.6* 27.3* 25.0*  MCV 89.1  --  91.4 93.3 94.7 96.2 95.8 94.3  PLT 124*  --  104* 108* 113* 135* 123* 99*   < > = values in this interval not displayed.     Urinalysis: No results for input(s): COLORURINE, LABSPEC, PHURINE, GLUCOSEU, HGBUR, BILIRUBINUR, KETONESUR, PROTEINUR, UROBILINOGEN, NITRITE, LEUKOCYTESUR in the last 72 hours.  Invalid input(s): APPERANCEUR    Imaging: No results found.   Medications:    sodium chloride 10 mL/hr (11/23/20 1022)   sodium chloride     sodium chloride 250 mL (11/24/20 1119)   ampicillin-sulbactam (UNASYN) IV 3 g (11/24/20 1120)    sodium chloride   Intravenous Once   allopurinol  100 mg Per Tube Daily   apixaban  5 mg Oral BID   vitamin C  500 mg Per Tube BID   atorvastatin  40 mg Oral Daily   budesonide (PULMICORT) nebulizer solution  0.5 mg Nebulization BID   diltiazem  360 mg Oral Daily   diltiazem  10 mg Intravenous Once   docusate  100 mg Per Tube BID   feeding supplement  237 mL Oral  TID BM   ferrous gluconate  324 mg Oral Q breakfast   hydrALAZINE  25 mg Oral Q8H   levothyroxine  100 mcg Per Tube Q0600   mouth rinse  15 mL Mouth Rinse BID   metoprolol tartrate  100 mg Oral BID   multivitamin with minerals  1 tablet Oral Daily   pantoprazole (PROTONIX) IV  40 mg Intravenous Daily   polyethylene glycol  17 g Per Tube Daily   sodium chloride flush  3 mL Intravenous Q12H   sodium chloride flush  3 mL Intravenous Q12H   venlafaxine  100 mg Per Tube BID    Assessment/ Plan:     Principal Problem:   Sepsis ([place]) Active Problems:   Atrial fibrillation with RVR (HCC)   Osteomyelitis of foot, left, acute ([place])   CKD stage 4 due to type 2 diabetes mellitus ([place])   Liver cirrhosis secondary to NASH (nonalcoholic steatohepatitis) ([place])   Hypertension   History of anemia due to CKD   Frequent falls   AKI (acute kidney injury) ([place])   Cellulitis and abscess of foot, except toes   Infectious  tenosynovitis   Wheeze   Atrial fibrillation with rapid ventricular response (HCC)   Bilateral carotid artery stenosis  70 year old female with history of chronic kidney disease stage IV, hypertension, diabetes, peripheral vascular disease, diabetic foot ulcer, osteomyelitis, cirrhosis, obstructive sleep apnea and atrial fibrillation.   #1: Acute kidney injury on CKD: Patient has acute kidney injury on the top of chronic kidney disease.  Nephrotoxic agents were removed.  She was also given IV fluid resuscitation.  Renal indicis are presently stable and are close to baseline.   #2: Osteomyelitis: Patient is on Unasyn at this time.   #3: Anemia: Anemia secondary to CKD.  S/p transfusion.  Continue iron orally.  Epogen as outpatient.   #4: Diabetes: Continue insulin as ordered.   #5: Gout: Patient is on allopurinol at 200 mg daily.  We will decrease the dose to 100 mg daily.   #6: Hypertension: Continue antihypertensive medications for now.   LOS: [place], [facility] 11/13/20222:19 PM

## 2020-11-25 ENCOUNTER — Encounter: Payer: Self-pay | Admitting: Vascular Surgery

## 2020-11-25 DIAGNOSIS — I4891 Unspecified atrial fibrillation: Secondary | ICD-10-CM | POA: Diagnosis not present

## 2020-11-25 DIAGNOSIS — N179 Acute kidney failure, unspecified: Secondary | ICD-10-CM | POA: Diagnosis not present

## 2020-11-25 LAB — CBC
HCT: 23.2 % — ABNORMAL LOW (ref 36.0–46.0)
Hemoglobin: 7.3 g/dL — ABNORMAL LOW (ref 12.0–15.0)
MCH: 30 pg (ref 26.0–34.0)
MCHC: 31.5 g/dL (ref 30.0–36.0)
MCV: 95.5 fL (ref 80.0–100.0)
Platelets: 82 10*3/uL — ABNORMAL LOW (ref 150–400)
RBC: 2.43 MIL/uL — ABNORMAL LOW (ref 3.87–5.11)
RDW: 18.1 % — ABNORMAL HIGH (ref 11.5–15.5)
WBC: 7.3 10*3/uL (ref 4.0–10.5)
nRBC: 0 % (ref 0.0–0.2)

## 2020-11-25 LAB — MAGNESIUM: Magnesium: 2 mg/dL (ref 1.7–2.4)

## 2020-11-25 LAB — BASIC METABOLIC PANEL
Anion gap: 8 (ref 5–15)
BUN: 65 mg/dL — ABNORMAL HIGH (ref 8–23)
CO2: 25 mmol/L (ref 22–32)
Calcium: 8.5 mg/dL — ABNORMAL LOW (ref 8.9–10.3)
Chloride: 109 mmol/L (ref 98–111)
Creatinine, Ser: 3.69 mg/dL — ABNORMAL HIGH (ref 0.44–1.00)
GFR, Estimated: 13 mL/min — ABNORMAL LOW (ref >60)
Glucose, Bld: 135 mg/dL — ABNORMAL HIGH (ref 70–99)
Potassium: 4.6 mmol/L (ref 3.5–5.1)
Sodium: 142 mmol/L (ref 135–145)

## 2020-11-25 LAB — PREPARE RBC (CROSSMATCH)

## 2020-11-25 MED ORDER — SODIUM CHLORIDE 0.9% IV SOLUTION: Freq: Once | INTRAVENOUS | Status: AC

## 2020-11-25 MED ORDER — DILTIAZEM HCL ER COATED BEADS 120 MG PO CP24
240.0000 mg | ORAL_CAPSULE | Freq: Every day | ORAL | Status: DC
  Administered 2020-11-25 – 2020-12-03 (×8): 240 mg via ORAL
  Filled 2020-11-25 (×9): qty 2

## 2020-11-25 MED ORDER — METOPROLOL TARTRATE 50 MG PO TABS
50.0000 mg | ORAL_TABLET | Freq: Two times a day (BID) | ORAL | Status: DC
  Administered 2020-11-26 – 2020-12-21 (×43): 50 mg via ORAL
  Filled 2020-11-25 (×47): qty 1

## 2020-11-25 NOTE — Progress Notes (Signed)
PROGRESS NOTE    Autumn [name]  NTI:[id number] DOB: [birth year] DOA: 11/09/2020 PCP: Harlow Ohms, MD  [id number]   Assessment & Plan:   Principal Problem:   Sepsis [facility]) Active Problems:   Atrial fibrillation with RVR ([place])   Osteomyelitis of foot, left, acute ([place])   CKD stage 4 due to type 2 diabetes mellitus ([place])   Liver cirrhosis secondary to NASH (nonalcoholic steatohepatitis) ([place])   Hypertension   History of anemia due to CKD   Frequent falls   AKI (acute kidney injury) ([place])   Cellulitis and abscess of foot, except toes   Infectious tenosynovitis   Wheeze   Atrial fibrillation with rapid ventricular response (HCC)   Bilateral carotid artery stenosis   70 yo F with history of T2DM, CKD, 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  She'd been following with a [facility] podiatrist outpatient and had recently refused surgery.  She'd recently been treated with antibiotics at [facility] for the diabetic foot infection with enterobacter aerogans culture positive.  She was admitted on 10/29 due to falls at home with worsening L foot chronic diabetic ulcer with osteomyelitis of the 2nd and 3rd metatarsals and cellulitis noted on MRI.  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  Her post operative course was complicated by unresponsiveness and neurologic changes, she was found to have a stroke and was intubated for airway protection.  She was extubated 11/3 and transferred to [facility] service on 11/4.   Acute Blood Loss Anemia Iron def Anemia of CKD In setting of heparin and recent surgical procedure - postop bleeding from surgical wound --s/p 2 units pRBC, Hgb stable in 8's. Labs with iron def, vit B12 elevated, folate wnl --Hgb dropped 1g in 2 days. Plan: --monitor Hgb --cont iron suppl --Epo as outpatient, per nephro --1u pRBC   Stroke Expressive aphasia,  improved Neurology suspects periprocedural vs related to atrial fibrillation MRI brian with acute to early subacute infarction in the L posterior frontal cortical and subcortical brain, possibly affecting the precentral gyrus.   --Echo with EF 55-60%, dilated LA, patent foramen ovale --carotid US right ICA artery worrisome for a subtotal occlusion.  Moderate to large amount of L sided atheroscelotic plaque, however, Left carotid angiogram with Dr. Delana Meyer  showed no significant stenosis. Plan: --cont Eliquis --cont statin   Acute Metabolic Encephalopathy Related to acute infection and above Delirium precautions Follow and w/u further as indicated   Septic Shock Diabetic Foot Infection Left Foot Osteomyelitis and Cellulitis  Septic Arthritis MRI foot with septic arthritis of second MTP joint and osteomyelitis of 2nd metatarsal head and second proximal phalanx, severe soft tissue swelling around second metatarsal neck concerning for phlegmon developing abscess measuring 2.1x1 cm circumferentially around distal shaft.  Mild early osteomyelitis of 3rd metatarsal head.  Severe soft tissue edema of the forefoot c/w cellulitis. MRI ankle limited -> interval resection of 2nd metatarsal, longitudinal tear of peroneus brevis with peroneus tenosynovitis, distal tibialis posterior tenosynovitis and tendinopathy, extensor digitorum tenosynovitis.  Extensive subcutaneous edema circumferentially in distal calf.  Dorsal subcutaneous edema in foot. S/p 2nd ray amputation, I&D, and bone bx of 3rd metatarsal on 11/1 --ID consulted Plan: --cont IV unasyn for 4 weeks until 12/09/20 -followed by PO augmentin until 12/23/20 --NWB LLE --cont wound VAC --ID followup as outpatient   Acute Hypoxic Respiratory Failure Aspiration Pneumonia, treated Pulm  edema 2/2 fluid overload CXR 11/3 with right lung opacity concerning for superimposed infection - also with findings concerning for pulmonary edema and small right  effusion --aspiration PNA considered treated with IV Unasyn --rapid called early morning 11/10 for respiratory distress.  CXR showed pulm edema (pt had received MIVF for 4 days before I d/c'ed it yesterday).  Started on BiPAP, and received IV lasix 40 x1, 80 x1, metolazone x1 --currently on 2L, BiPAP at night Plan: --Continue supplemental O2 to keep sats >=90%, wean as tolerated --BiPAP nightly  Atrial Fibrillation with RVR --went into RVR due to meds on hold for NPO status and procedures --cardiology following Plan: --reduce Cardizem to 240 mg daily --reduce Lopressor to 50 mg BID --cont Eliquis   HTN --BP trending up --cont Cardizem and Lopressor as above --increase hydralazine to 50 mg q8h  Thrombocytopenia --HIT ab neg.   Dysphagia 2/2 stroke --SLP following --cont Dys 3, thin liquid   AKI on CKD IV Baseline creatinine around 3.2. Chronic kidney disease secondary to diabetes.  Acute kidney injury secondary to ATN. --nephrology following --hold home Lisinopril and Bumex   Elevated Troponin Cardiology following, thought 2/2 demand   Hx NASH cirrhosis --compensated    Hypothyroidism --cont Synthroid   T2DM A1c 6.3 --BG has been within inpatient goal, has not needed SSI. --no need for BG checks and SSI   DVT prophylaxis: OX:[id number] Code Status: Full code  Family Communication: friend [name] updated at bedside today.  Level of care: Progressive Cardiac Dispo:   The patient is from: home Anticipated d/c is to: [facility] inpatient rehab Anticipated d/c date is: undetermined Patient currently is not medically ready to d/c due to: Hgb trending down    Subjective and Interval History:  Pt reported breathing better today.  Not eating much but drinking Ensure.  Speech improved a lot.    Juanda Bond at bedside confirmed that she can not take pt home with her.  Hgb dropped by ~1g in 2 days.  Later, PT reported seeing some dark stool with blood streaks.      Objective: Vitals:   11/25/20 0756 11/25/20 0827 11/25/20 1138 11/25/20 1539  BP: 139/72  (!) 155/76 (!) 127/53  Pulse: 67 60 64 (!) 58  Resp: 18 13 17 17   Temp: 97.9 F (36.6 C)  98 F (36.7 C) 98.4 F (36.9 C)  TempSrc:   Oral   SpO2: 99% 99% 96% 99%  Weight:      Height:        Intake/Output Summary (Last 24 hours) at 11/25/2020 1623 Last data filed at 11/25/2020 1328 Gross per 24 hour  Intake 1080 ml  Output 1200 ml  Net -120 ml    Filed Weights   11/21/20 0454 11/25/20 0024 11/25/20 0242  Weight: 97.9 kg 96 kg 96 kg    Examination:   Constitutional: NAD, AAOx3, speech much improved HEENT: conjunctivae and lids normal, EOMI CV: No cyanosis.   RESP: normal respiratory effort, on 2L Extremities: LLE swelling improved, left foot connected to wound vac SKIN: warm, dry Neuro: II - XII grossly intact.   Psych: better mood and affect today.   Data Reviewed: I have personally reviewed following labs and imaging studies  CBC: Recent Labs  Lab 11/19/20 0543 11/20/20 0504 11/21/20 0510 11/22/20 0709 11/23/20 0333 11/24/20 0500 11/25/20 0430  WBC 12.5* 10.7* 12.6* 11.9* 9.2 9.1 7.3  NEUTROABS 9.6* 7.9*  --   --   --   --   --  HGB 8.5* 8.4* 8.4* 8.5* 8.4* 7.9* 7.3*  HCT 26.6* 26.5* 26.9* 27.6* 27.3* 25.0* 23.2*  MCV 91.4 93.3 94.7 96.2 95.8 94.3 95.5  PLT 104* 108* 113* 135* 123* 99* 82*   Basic Metabolic Panel: Recent Labs  Lab 11/19/20 0543 11/20/20 0504 11/21/20 0510 11/22/20 0709 11/23/20 0333 11/24/20 0500 11/25/20 0430  NA 141 142 140 141 142 140 142  K 4.6 5.0 4.9 5.1 4.8 4.6 4.6  CL 107 107 108 107 105 105 109  CO2 25 26 25 25 26 23 25   GLUCOSE 136* 132* 133* 124* 133* 113* 135*  BUN 79* 81* 71* 65* 72* 66* 65*  CREATININE 3.36* 3.32* 3.38* 3.54* 3.56* 3.62* 3.69*  CALCIUM 8.8* 9.0 8.6* 8.8* 8.8* 8.7* 8.5*  MG 2.1 2.1 1.9 2.0 1.8 2.0 2.0  PHOS 3.8 4.0  --   --   --   --   --    GFR: Estimated Creatinine Clearance: 17 mL/min (A)  (by C-G formula based on SCr of 3.69 mg/dL (H)). Liver Function Tests: Recent Labs  Lab 11/19/20 0543 11/20/20 0504  AST 19 20  ALT 16 16  ALKPHOS 57 58  BILITOT 0.8 0.6  PROT 4.9* 5.5*  ALBUMIN 2.1* 2.3*   No results for input(s): LIPASE, AMYLASE in the last 168 hours. No results for input(s): AMMONIA in the last 168 hours. Coagulation Profile: Recent Labs  Lab 11/22/20 0709  INR 1.3*   Cardiac Enzymes: No results for input(s): CKTOTAL, CKMB, CKMBINDEX, TROPONINI in the last 168 hours. BNP (last 3 results) No results for input(s): PROBNP in the last 8760 hours. HbA1C: No results for input(s): HGBA1C in the last 72 hours. CBG: Recent Labs  Lab 11/23/20 0822 11/23/20 1125 11/23/20 1618 11/24/20 0825 11/24/20 1304  GLUCAP 122* 157* 120* 107* 132*   Lipid Profile: No results for input(s): CHOL, HDL, LDLCALC, TRIG, CHOLHDL, LDLDIRECT in the last 72 hours. Thyroid Function Tests: No results for input(s): TSH, T4TOTAL, FREET4, T3FREE, THYROIDAB in the last 72 hours. Anemia Panel: No results for input(s): VITAMINB12, FOLATE, FERRITIN, TIBC, IRON, RETICCTPCT in the last 72 hours. Sepsis Labs: Recent Labs  Lab 11/21/20 0510  PROCALCITON 0.10    No results found for this or any previous visit (from the past 240 hour(s)).     Radiology Studies: No results found.   Scheduled Meds:  sodium chloride   Intravenous Once   allopurinol  100 mg Oral Daily   apixaban  5 mg Oral BID   vitamin C  500 mg Oral BID   atorvastatin  40 mg Oral Daily   budesonide (PULMICORT) nebulizer solution  0.5 mg Nebulization BID   diltiazem  240 mg Oral Daily   diltiazem  10 mg Intravenous Once   docusate  100 mg Oral BID   feeding supplement  237 mL Oral TID BM   ferrous gluconate  324 mg Oral Q breakfast   hydrALAZINE  50 mg Oral Q8H   levothyroxine  100 mcg Oral Q0600   mouth rinse  15 mL Mouth Rinse BID   metoprolol tartrate  50 mg Oral BID   multivitamin with minerals  1  tablet Oral Daily   pantoprazole (PROTONIX) IV  40 mg Intravenous Daily   polyethylene glycol  17 g Oral Daily   sodium chloride flush  3 mL Intravenous Q12H   sodium chloride flush  3 mL Intravenous Q12H   venlafaxine  100 mg Oral BID   Continuous Infusions:  sodium chloride  Stopped (11/23/20 2305)   sodium chloride     sodium chloride 250 mL (11/25/20 1208)   ampicillin-sulbactam (UNASYN) IV 200 mL/hr at 11/25/20 1209     LOS: 16 days     Enzo Bi, MD [facility] If 7PM-7AM, please contact night-coverage 11/25/2020, 4:23 PM

## 2020-11-25 NOTE — Progress Notes (Signed)
Progress Note  Patient Name: Autumn [name] Date of Encounter: 11/25/2020  Primary Cardiologist: Nelva Bush, MD  Subjective   Currently on BiPAP .  She denies chest pain or shortness of breath.  She is maintaining in sinus rhythm with intermittent sinus bradycardia.   Inpatient Medications    Scheduled Meds:  sodium chloride   Intravenous Once   allopurinol  100 mg Oral Daily   apixaban  5 mg Oral BID   vitamin C  500 mg Oral BID   atorvastatin  40 mg Oral Daily   budesonide (PULMICORT) nebulizer solution  0.5 mg Nebulization BID   diltiazem  240 mg Oral Daily   diltiazem  10 mg Intravenous Once   docusate  100 mg Oral BID   feeding supplement  237 mL Oral TID BM   ferrous gluconate  324 mg Oral Q breakfast   hydrALAZINE  50 mg Oral Q8H   levothyroxine  100 mcg Oral Q0600   mouth rinse  15 mL Mouth Rinse BID   metoprolol tartrate  100 mg Oral BID   multivitamin with minerals  1 tablet Oral Daily   pantoprazole (PROTONIX) IV  40 mg Intravenous Daily   polyethylene glycol  17 g Oral Daily   sodium chloride flush  3 mL Intravenous Q12H   sodium chloride flush  3 mL Intravenous Q12H   venlafaxine  100 mg Oral BID   Continuous Infusions:  sodium chloride Stopped (11/23/20 2305)   sodium chloride     sodium chloride Stopped (11/24/20 1330)   ampicillin-sulbactam (UNASYN) IV 3 g (11/24/20 2252)   PRN Meds: sodium chloride, sodium chloride, sodium chloride, [DISCONTINUED] acetaminophen **OR** acetaminophen, acetaminophen, albuterol, diltiazem, hydrALAZINE, meclizine, ondansetron **OR** ondansetron (ZOFRAN) IV, ondansetron (ZOFRAN) IV, sodium chloride flush, sodium chloride flush   Vital Signs    Vitals:   11/25/20 0242 11/25/20 0500 11/25/20 0756 11/25/20 0827  BP:  (!) 150/74 139/72   Pulse:  62 67 60  Resp:  20 18 13   Temp:  97.8 F (36.6 C) 97.9 F (36.6 C)   TempSrc:  Oral    SpO2:  100% 99% 99%  Weight: 96 kg     Height:        Intake/Output  Summary (Last 24 hours) at 11/25/2020 0934 Last data filed at 11/25/2020 0016 Gross per 24 hour  Intake 723.94 ml  Output 450 ml  Net 273.94 ml    Filed Weights   11/21/20 0454 11/25/20 0024 11/25/20 0242  Weight: 97.9 kg 96 kg 96 kg    Physical Exam   GEN: Currently on BiPAP complaining of rigors HEENT: Normal NECK: No JVD; No carotid bruits LYMPHATICS: No lymphadenopathy CARDIAC:RRR, no murmurs, rubs, gallops RESPIRATORY: Clear to auscultation bilaterally. ABDOMEN: Soft, non-tender, non-distended MUSCULOSKELETAL:  No edema; that is post left second metatarsal amputation SKIN: Warm and dry NEUROLOGIC:  Alert and oriented x 3 PSYCHIATRIC:  Normal affect   Labs    Chemistry Recent Labs  Lab 11/19/20 0543 11/20/20 0504 11/21/20 0510 11/23/20 0333 11/24/20 0500 11/25/20 0430  NA 141 142   < > 142 140 142  K 4.6 5.0   < > 4.8 4.6 4.6  CL 107 107   < > 105 105 109  CO2 25 26   < > 26 23 25   GLUCOSE 136* 132*   < > 133* 113* 135*  BUN 79* 81*   < > 72* 66* 65*  CREATININE 3.36* 3.32*   < > 3.56*  3.62* 3.69*  CALCIUM 8.8* 9.0   < > 8.8* 8.7* 8.5*  PROT 4.9* 5.5*  --   --   --   --   ALBUMIN 2.1* 2.3*  --   --   --   --   AST 19 20  --   --   --   --   ALT 16 16  --   --   --   --   ALKPHOS 57 58  --   --   --   --   BILITOT 0.8 0.6  --   --   --   --   GFRNONAA 14* 15*   < > 13* 13* 13*  ANIONGAP 9 9   < > 11 12 8    < > = values in this interval not displayed.      Hematology Recent Labs  Lab 11/23/20 0333 11/24/20 0500 11/25/20 0430  WBC 9.2 9.1 7.3  RBC 2.85* 2.65* 2.43*  HGB 8.4* 7.9* 7.3*  HCT 27.3* 25.0* 23.2*  MCV 95.8 94.3 95.5  MCH 29.5 29.8 30.0  MCHC 30.8 31.6 31.5  RDW 17.8* 17.9* 18.1*  PLT 123* 99* 82*     Cardiac Enzymes  Recent Labs  Lab 11/09/20 1220 11/09/20 1818 11/10/20 0115 11/10/20 0649 11/21/20 0510  TROPONINIHS 147* 151* 162* 149* 81*       BNP Recent Labs  Lab 11/21/20 0510  BNP 589.6*      Lipids  Lab  Results  Component Value Date   CHOL 101 11/10/2020   HDL 21 (L) 11/10/2020   LDLCALC 50 11/10/2020   TRIG 213 (H) 11/14/2020   CHOLHDL 4.8 11/10/2020    HbA1c  Lab Results  Component Value Date   HGBA1C 6.3 (H) 11/09/2020    Radiology    PERIPHERAL VASCULAR CATHETERIZATION  Result Date: 11/22/2020 See surgical note for result.   Telemetry    Normal sinus rhythm.- Personally Reviewed  Cardiac Studies   2D Echocardiogram 10.30.2022   1. Left ventricular ejection fraction, by estimation, is 55 to 60%. The  left ventricle has normal function. The left ventricle has no regional  wall motion abnormalities. There is mild concentric left ventricular  hypertrophy. Left ventricular diastolic  parameters are indeterminate. Elevated left ventricular end-diastolic  pressure.   2. Right ventricular systolic function is normal. The right ventricular  size is normal. There is normal pulmonary artery systolic pressure.   3. Left atrial size was severely dilated.   4. The mitral valve is normal in structure. Trivial mitral valve  regurgitation. No evidence of mitral stenosis.   5. The aortic valve is tricuspid. Aortic valve regurgitation is not  visualized. No aortic stenosis is present.   6. The inferior vena cava is normal in size with greater than 50%  respiratory variability, suggesting right atrial pressure of 3 mmHg.   7. There is a small patent foramen ovale.   Patient Profile     Autumn [name] is a 70 y.o. female with a hx of HFpEF, CKD stage IV-V, diabetes since her late 6s to early 52s with diabetic neuropathy and diabetic foot ulcer, HTN, anemia of chronic disease, hepatic cirrhosis, sleep apnea, and gout who was admitted October 29th and has had a complex hospital course in the setting of acute stroke, rapid atrial fibrillation, left foot osteomyelitis, blood loss anemia, respiratory failure, volume overload, acute kidney injury, and sepsis.  Assessment & Plan     1.  Atrial fibrillation with  rapid ventricular response:  -Initial difficulty with rate control but she ultimately converted to sinus rhythm.  She is maintaining sinus rhythm with intermittent sinus bradycardia.  I elected to decrease diltiazem extended release from 360 mg  -Continue Eliquis 5 mg twice daily.   Renal function has stabilized and the patient does not meet criteria for reduced dose Eliquis given her age and weight.  2.  Acute respiratory failure:  -Currently on BiPAP -Stable overall.  3.  Demand ischemia/elevated troponin:  -In the setting of A. fib with RVR, CKD 4, sepsis, osteomyelitis, anemia, and stroke, troponin peaked at 162.  -Echo this admission showed an EF of 55 to 60%.   -Continue Lopressor 100 mg twice daily, atorvastatin 40 mg daily  -No aspirin due to DOAC .  No plans for ischemic cardiac evaluation during this admission.  4.  Sepsis/osteomyelitis: Antibiotics per ID.  Vascular surgery following.  5.  Acute stroke: Presumed to be embolic in the setting of A. fib.  On Eliquis.  Strength improving.  6.  Stage IV chronic kidney disease: Relatively stable.  Nephrology following.  7.  Normocytic anemia: Stable.  Follow on Eliquis.    Signed, Kathlyn Sacramento, MD  11/25/2020, 9:34 AM    For questions or updates, please contact   Please consult [email or web address] for contact info under Cardiology/STEMI.

## 2020-11-25 NOTE — Progress Notes (Signed)
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 Other comorbidities include sepsis without shock secondary to osteomyelitis and cellulitis left foot (IV zosyn), AKI on CKD, rapid afib (Cardizem + Heparin gtt), NSTEMI suspect demand ischemia, anemia acute on chronic. Transferred to ICU and intubated 11/2 after evolving neuro changes with unresponsiveness after coughing episode. 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   OT comments  Autumn [name] seen for OT/PT co-treatment on this date. Upon arrival to room pt awake/alert. Pt in bed with encouraging friend present. Pt agreeable to tx. MIN A + 2 for bed mobility. MIN guard + 2 lateral scoot, + 2 to maintain NWB on LLE. Requiring MAX A +2 for pericare, rolling. MOD A for clothing management- gown change, supine.  Pt instructed in WB precautions yet still needed assist to maintain pcns. Pt maintained O2 in the 90s throughout tx session. Bloody spots noted in BM, RN notified.   The Orientation Log (O-Log) is designed to be a quick quantitative measure of orientational status for use at bedside with rehabilitation inpatients. Place, time, and situational (Etiology/Event + Pathology/Deficits) domains are assessed and can be tracked over time. Patient responses are scored based on spontaneous recall, logical cueing, multiple choice, and incorrect responses. Today pt scored 22/30. This demonstrates significant improvement from 11/09 when pt scored  14/30.  Pt making good progress toward goals. Pt continues to benefit from skilled OT services to maximize return to PLOF and minimize risk of future falls, injury, caregiver burden, and readmission. Will continue to follow POC. Discharge recommendation remains appropriate.     Recommendations for follow up therapy are one component of a multi-disciplinary discharge planning process, led by the attending physician.  Recommendations may be updated based on patient status, additional functional criteria and insurance authorization.    Follow Up Recommendations  Acute inpatient rehab (3hours/day)    Assistance Recommended at Discharge Frequent or constant Supervision/Assistance  Equipment Recommendations  Other (comment) (defer to next venue of care)    Recommendations for Other Services      Precautions / Restrictions Precautions Precautions: Fall Restrictions Weight Bearing Restrictions: Yes LLE Weight Bearing: Non weight bearing Other Position/Activity Restrictions: wound vac       Mobility Bed Mobility Overal bed mobility: Needs Assistance Bed Mobility: Supine to Sit;Sit to Supine     Supine to sit: HOB elevated;Min assist;+2 for physical assistance Sit to supine: Min assist;+2 for physical assistance   General bed mobility comments: reaches to opposite bed rails for bringing trunk upright, MOD A for scooting to [facility]    Transfers Overall transfer level: Needs assistance Equipment used: None Transfers: Bed to chair/wheelchair/BSC            Lateral/Scoot Transfers: Min guard;+2 physical assistance General transfer comment: x 3 lateral scoot attempts without movement, last one was able to elevate bottom and scoot laterally x 1; scoots A/P with cues w/ increasing SOB, rest break in between scoots     Balance Overall balance assessment: Needs assistance Sitting-balance support: No upper extremity supported Sitting balance-Leahy Scale: Good  Sitting balance - Comments:  reaches outside BOS across midline without LOB, sits statically ~ 15 min       Standing balance comment: deferred                           ADL either performed or assessed with clinical judgement   ADL Overall ADL's : Needs assistance/impaired                                       General ADL Comments: MAX A +2 for pericare, rolling. MOD A for clothing management- gown change.    Extremity/Trunk Assessment              Vision       Perception     Praxis      Cognition Arousal/Alertness: Awake/alert Behavior During Therapy: WFL for tasks assessed/performed Overall Cognitive Status: Impaired/Different from baseline Area of Impairment: Orientation;Memory                 Orientation Level: Disoriented to;Place   Memory: Decreased recall of precautions Following Commands: Follows one step commands inconsistently;Follows one step commands with increased time Safety/Judgement: Decreased awareness of safety;Decreased awareness of deficits   Problem Solving: Slow processing;Requires verbal cues;Requires tactile cues General Comments: O-LOG score 22/30          Exercises Exercises: Other exercises General Exercises - Lower Extremity Long Arc Quad: AROM;10 reps;Seated Other Exercises Other Exercises: Pt educ re: OT role, WB precations, ECS Other Exercises: Sup<>sit, O-LOG, UB reaching (PT), leg ex (PT), scooting, pericare/ linen/ gown change   Shoulder Instructions       General Comments      Pertinent Vitals/ Pain       Pain Assessment: Faces Faces Pain Scale: Hurts a little bit Pain Location: LLE with mobility/touch Pain Descriptors / Indicators: Grimacing Pain Intervention(s): Limited activity within patient's tolerance  Home Living                                          Prior Functioning/Environment              Frequency  Min 5X/week        Progress Toward Goals  OT Goals(current  goals can now be found in the care plan section)  Progress towards OT goals: Progressing toward goals  Acute Rehab OT Goals OT Goal Formulation: With patient Time For Goal Achievement: 11/28/20 Potential to Achieve Goals: Good ADL Goals Pt Will Perform Grooming: with min assist;sitting Pt/caregiver will Perform Home Exercise Program: Increased ROM;Increased strength;Both right and left upper extremity;With minimal assist Additional ADL Goal #1: Pt will follow 3/3 two-step commands with MOD cueing.  Plan Discharge plan remains appropriate;Frequency remains appropriate    Co-evaluation    PT/OT/SLP Co-Evaluation/Treatment: Yes Reason for Co-Treatment: Complexity of the patient's impairments (multi-system involvement);To address functional/ADL transfers PT goals addressed during session: Mobility/safety with mobility OT goals addressed during session: ADL's and self-care      AM-PAC OT "6 Clicks" Daily Activity     Outcome Measure   Help from another person eating meals?: A Little Help from another person taking care of personal grooming?: A Little Help from another person toileting, which includes using toliet, bedpan, or urinal?: A Lot Help from another  person bathing (including washing, rinsing, drying)?: A Lot Help from another person to put on and taking off regular upper body clothing?: A Little Help from another person to put on and taking off regular lower body clothing?: A Lot 6 Click Score: 15    End of Session Equipment Utilized During Treatment: Oxygen  OT Visit Diagnosis: Other abnormalities of gait and mobility (R26.89);Hemiplegia and hemiparesis;Muscle weakness (generalized) (M62.81)   Activity Tolerance Patient tolerated treatment well   Patient Left in bed;with call bell/phone within reach;with bed alarm set;with family/visitor present   Nurse Communication Other (comment) (Notified about BM w/ blood present)        Time: 1103-1594 OT Time Calculation  (min): 44 min  Charges: OT General Charges $OT Visit: 1 Visit OT Treatments $Self Care/Home Management : 8-22 mins $Therapeutic Activity: 8-22 mins  Nino Glow, Markus Daft 11/25/2020, 4:46 PM

## 2020-11-25 NOTE — Progress Notes (Signed)
Patient's dressing changed. The borders are not approximated. Sutures are present. Dressing changed per orders in [facility]. No drainage noted. Skin is pink around borders and tendon is exposed superficially. Patient does not complain of pain. Left Lower extremity is edematous. Left foot elevated off bed.

## 2020-11-25 NOTE — Progress Notes (Signed)
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 Other comorbidities include sepsis without shock secondary to osteomyelitis and cellulitis left foot (IV zosyn), AKI on CKD, rapid afib (Cardizem + Heparin gtt), NSTEMI suspect demand ischemia, anemia acute on chronic. Transferred to ICU and intubated 11/2 after evolving neuro changes with unresponsiveness after coughing episode. 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    PT Comments    Pt alert, agreeable to therapy w/ friend present throughout treatment providing encouragement as needed. Pt demonstrated excellent effort with improved function this treatment session. Pt was able to scoot laterally with cues for technique but ultimately perform maneuver by taking the time to process info independently. Pt does requiring + 2 assist to maintain NWB status. Pt also progressed to requiring less physical assist for bed mobility (MIN-A) using log roll technique and HOB elevated. SOB noted after several attempts of scooting w/ SpO2 > 92%. Overall primary limitations are decreased activity tolerance and current NWB status for functional WB mobility. CIR remains primary recommendation. Skilled PT intervention is indicated to address deficits in function, mobility, and to return to PLOF as able.     Recommendations for follow up therapy are one component of a multi-disciplinary discharge planning process, led by the attending physician.  Recommendations may be  updated based on patient status, additional functional criteria and insurance authorization.  Follow Up Recommendations  Acute inpatient rehab (3hours/day)     Assistance Recommended at Discharge Frequent or constant Supervision/Assistance  Equipment Recommendations  Other (comment) (TBD next venue of care)    Recommendations for Other Services       Precautions / Restrictions Precautions Precautions: Fall Restrictions Weight Bearing Restrictions: Yes LLE Weight Bearing: Non weight bearing     Mobility  Bed Mobility Overal bed mobility: Needs Assistance Bed Mobility: Supine to Sit;Sit to Supine     Supine to sit: HOB elevated;Min assist Sit to supine: Min assist   General bed mobility comments: reaches to opposite bed rails for bringing trunk upright, MOD A for scooting to [facility]    Transfers Overall transfer level: Needs assistance Equipment used: None Transfers: Bed to chair/wheelchair/BSC            Lateral/Scoot Transfers: Min guard General transfer comment: x 3 lateral scoot attempts without movement, last one was able to elevate bottom and scoot laterally x 1; scoots A/P with cues w/ increasing SOB, rest break in between scoots    Ambulation/Gait               General Gait Details: deferred   Stairs             Wheelchair Mobility    Modified Rankin (Stroke Patients Only)       Balance Overall balance assessment: Needs assistance Sitting-balance support: Bilateral upper extremity supported Sitting balance-Leahy Scale: Good Sitting balance - Comments: reaches outside BOS across midline without LOB, sits statically ~ 15 min       Standing balance comment: deferred  Cognition Arousal/Alertness: Awake/alert Behavior During Therapy: WFL for tasks assessed/performed Overall Cognitive Status: Impaired/Different from baseline Area of Impairment: Orientation;Memory;Following  commands;Safety/judgement;Problem solving                 Orientation Level: Disoriented to;Person;Place;Situation   Memory: Decreased recall of precautions;Decreased short-term memory Following Commands: Follows one step commands inconsistently;Follows one step commands with increased time Safety/Judgement: Decreased awareness of safety;Decreased awareness of deficits   Problem Solving: Slow processing;Requires verbal cues;Requires tactile cues General Comments: decreased STM, unable to recall PT name x 3 reminders, continues to stare into space during fatigue        Exercises General Exercises - Lower Extremity Long Arc Quad: AROM;10 reps;Seated Other Exercises Other Exercises: PT/OT performed pt and linen clean up 2/2 BM pt able to roll w/ CGA    General Comments        Pertinent Vitals/Pain Pain Assessment: Faces Faces Pain Scale: No hurt Pain Intervention(s): Monitored during session    Home Living                          Prior Function            PT Goals (current goals can now be found in the care plan section) Progress towards PT goals: Progressing toward goals    Frequency    7X/week      PT Plan Current plan remains appropriate    Co-evaluation PT/OT/SLP Co-Evaluation/Treatment: Yes Reason for Co-Treatment: To address functional/ADL transfers PT goals addressed during session: Mobility/safety with mobility OT goals addressed during session: ADL's and self-care      AM-PAC PT "6 Clicks" Mobility   Outcome Measure  Help needed turning from your back to your side while in a flat bed without using bedrails?: A Little Help needed moving from lying on your back to sitting on the side of a flat bed without using bedrails?: A Little Help needed moving to and from a bed to a chair (including a wheelchair)?: A Lot Help needed standing up from a chair using your arms (e.g., wheelchair or bedside chair)?: Total Help needed to walk in  hospital room?: Total Help needed climbing 3-5 steps with a railing? : Total 6 Click Score: 11    End of Session Equipment Utilized During Treatment: Oxygen;Gait belt Activity Tolerance: Patient tolerated treatment well Patient left: in bed;with bed alarm set;with call bell/phone within reach;with family/visitor present Nurse Communication: Mobility status PT Visit Diagnosis: Other abnormalities of gait and mobility (R26.89);Hemiplegia and hemiparesis;Muscle weakness (generalized) (M62.81);History of falling (Z91.81) Hemiplegia - Right/Left: Right Hemiplegia - dominant/non-dominant: Dominant     Time: 6073-7106 PT Time Calculation (min) (ACUTE ONLY): 44 min  Charges:                        [employer], SPT

## 2020-11-25 NOTE — Progress Notes (Signed)
Per physical therapy patient has a stool which was dark with red streaks. Make Dr. Billie Ruddy aware. Will continue to monitor

## 2020-11-25 NOTE — Progress Notes (Signed)
Physical Therapy noted some blood in patient's stool and informed the nurse. This was not seen so I could not assess the amount.   Doctor informed.

## 2020-11-25 NOTE — Progress Notes (Signed)
[facility]  PROGRESS NOTE   Subjective:   Creatinine 3.69 (3.62) UOP 425mL.   She states she is starting to feel better.    Objective:  Vital signs in last 24 hours:  Temp:  [97.8 F (36.6 C)-98.4 F (36.9 C)] 98 F (36.7 C) (11/14 1138) Pulse Rate:  [57-67] 64 (11/14 1138) Resp:  [13-22] 17 (11/14 1138) BP: (139-163)/(72-76) 155/76 (11/14 1138) SpO2:  [96 %-100 %] 96 % (11/14 1138) FiO2 (%):  [30 %] 30 % (11/14 0827) Weight:  [96 kg] 96 kg (11/14 0242)  Weight change:  Filed Weights   11/21/20 0454 11/25/20 0024 11/25/20 0242  Weight: 97.9 kg 96 kg 96 kg    Intake/Output: I/O last 3 completed shifts: In: 998.9 [P.O.:632; I.V.:66.9; IV Piggyback:300] Out: 1100 [Urine:1100]   Intake/Output this shift:  Total I/O In: 720 [P.O.:720] Out: 750 [Urine:750]  Physical Exam: General:  No acute distress  Head:  Normocephalic, atraumatic. Moist oral mucosal membranes  Eyes:  Anicteric  Neck:  Supple  Lungs:   Clear to auscultation, normal effort  Heart:  regular  Abdomen:   Soft, nontender, bowel sounds present  Extremities:  Left lower extremity peripheral edema. +wound vac to left foot.   Neurologic:  Awake, alert, following commands  Skin:  No lesions       Basic Metabolic Panel: Recent Labs  Lab 11/19/20 0543 11/20/20 0504 11/21/20 0510 11/22/20 0709 11/23/20 0333 11/24/20 0500 11/25/20 0430  NA 141 142 140 141 142 140 142  K 4.6 5.0 4.9 5.1 4.8 4.6 4.6  CL 107 107 108 107 105 105 109  CO2 25 26 25 25 26 23 25   GLUCOSE 136* 132* 133* 124* 133* 113* 135*  BUN 79* 81* 71* 65* 72* 66* 65*  CREATININE 3.36* 3.32* 3.38* 3.54* 3.56* 3.62* 3.69*  CALCIUM 8.8* 9.0 8.6* 8.8* 8.8* 8.7* 8.5*  MG 2.1 2.1 1.9 2.0 1.8 2.0 2.0  PHOS 3.8 4.0  --   --   --   --   --      CBC: Recent Labs  Lab 11/19/20 0543 11/20/20 0504 11/21/20 0510 11/22/20 0709 11/23/20 0333 11/24/20 0500 11/25/20 0430  WBC 12.5* 10.7* 12.6* 11.9* 9.2 9.1 7.3   NEUTROABS 9.6* 7.9*  --   --   --   --   --   HGB 8.5* 8.4* 8.4* 8.5* 8.4* 7.9* 7.3*  HCT 26.6* 26.5* 26.9* 27.6* 27.3* 25.0* 23.2*  MCV 91.4 93.3 94.7 96.2 95.8 94.3 95.5  PLT 104* 108* 113* 135* 123* 99* 82*      Urinalysis: No results for input(s): COLORURINE, LABSPEC, PHURINE, GLUCOSEU, HGBUR, BILIRUBINUR, KETONESUR, PROTEINUR, UROBILINOGEN, NITRITE, LEUKOCYTESUR in the last 72 hours.  Invalid input(s): APPERANCEUR    Imaging: No results found.   Medications:    sodium chloride Stopped (11/23/20 2305)   sodium chloride     sodium chloride 250 mL (11/25/20 1208)   ampicillin-sulbactam (UNASYN) IV 200 mL/hr at 11/25/20 1209    sodium chloride   Intravenous Once   allopurinol  100 mg Oral Daily   apixaban  5 mg Oral BID   vitamin C  500 mg Oral BID   atorvastatin  40 mg Oral Daily   budesonide (PULMICORT) nebulizer solution  0.5 mg Nebulization BID   diltiazem  240 mg Oral Daily   diltiazem  10 mg Intravenous Once   docusate  100 mg Oral BID   feeding supplement  237 mL Oral TID BM  ferrous gluconate  324 mg Oral Q breakfast   hydrALAZINE  50 mg Oral Q8H   levothyroxine  100 mcg Oral Q0600   mouth rinse  15 mL Mouth Rinse BID   metoprolol tartrate  50 mg Oral BID   multivitamin with minerals  1 tablet Oral Daily   pantoprazole (PROTONIX) IV  40 mg Intravenous Daily   polyethylene glycol  17 g Oral Daily   sodium chloride flush  3 mL Intravenous Q12H   sodium chloride flush  3 mL Intravenous Q12H   venlafaxine  100 mg Oral BID    Assessment/ Plan:     Principal Problem:   Sepsis ([place]) Active Problems:   Atrial fibrillation with RVR (HCC)   Osteomyelitis of foot, left, acute (HCC)   CKD stage 4 due to type 2 diabetes mellitus (HCC)   Liver cirrhosis secondary to NASH (nonalcoholic steatohepatitis) (HCC)   Hypertension   History of anemia due to CKD   Frequent falls   AKI (acute kidney injury) ([place])   Cellulitis and abscess of foot, except toes    Infectious tenosynovitis   Wheeze   Atrial fibrillation with rapid ventricular response (HCC)   Bilateral carotid artery stenosis  Autumn [name] is a 70 y.o. white female with hypertension, diabetes mellitus type II, peripheral vascular disease, sleep apnea, atrial fibrillation who is admitted to [facility] on 11/09/2020 for Atrial fibrillation with rapid ventricular response (HCC) [I48.91] AKI (acute kidney injury) ([place]) [N17.9] Osteomyelitis of foot, left, acute (HCC) [C14.481] Atrial fibrillation with RVR ([place]) [I48.91] Worsening renal function [N28.9] Syncope, unspecified syncope type [R55]  Hospital course complicated by acute kidney injury and requiring amputation on 10/29 by Dr. Sherryle Lis. Patient with atrial fibrillation and acute CVA this admission.   #1: Acute kidney injury on chronic kidney disease stage IV with proteinuria: baseline creatinine of 3.19, GFR of 15 on 09/27/2020. Chronic kidney disease secondary to diabetes.  Acute kidney injury secondary to ATN - Holding lisinopril and bumetanide.  - No indication for dialysis at this time.    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 Now with wound vac and IV unasyn.    #3: Anemia with chronic kidney disease: status post PRBC transfusions. Hemoglobin 7.3, with low platelets.    #4: Diabetes mellitus type II with chronic kidney disease: insulin dependent. Hemoglobin A1c of 6.3%.   #5: Hypertension: 155/76. On diltiazem, hydralazine, and metoprolol   #6: Gout:  - allopurinol appropriately dosed    LOS: [place], MD [facility] 11/14/20222:50 PM

## 2020-11-25 NOTE — Consult Note (Addendum)
Pharmacy Antibiotic Note  Autumn [name] is a 70 y.o. female admitted on 11/09/2020 with  osteomyelitis .  Pharmacy has been consulted for Unasyn dosing. ID following.   Presented with left foot draining. PMH includes liver cirrhosis d/t NASH, HTN, thyroid disease, DM. MRI of foot c/w osteomyelitis and abscess. Chest CT c/f PNA, covered by current abx.   Scr improved since admission, remains persistently elevated 3.3-3.5, at 3.69 today. Pt may have degree of volume overload contributing. Cultures of left foot abscess resulted, narrowing to Unasyn as per discussion with ID.   Plan: Ampicillin/sulbactam 3 grams every 12 hours Planning for LOT 4wks per ID rec  Monitor renal function, clinical course, LOT.   Height: 5\' 6"  (167.6 cm) Weight: 96 kg (211 lb 10.3 oz) IBW/kg (Calculated) : 59.3  Temp (24hrs), Avg:98.2 F (36.8 C), Min:97.8 F (36.6 C), Max:98.7 F (37.1 C)  Recent Labs  Lab 11/21/20 0510 11/22/20 0709 11/23/20 0333 11/24/20 0500 11/25/20 0430  WBC 12.6* 11.9* 9.2 9.1 7.3  CREATININE 3.38* 3.54* 3.56* 3.62* 3.69*     Estimated Creatinine Clearance: 17 mL/min (A) (by C-G formula based on SCr of 3.69 mg/dL (H)).    Allergies  Allergen Reactions   Codeine Nausea And Vomiting    Antimicrobials this admission: ongoing 11/3 Unasyn >> Completed 10/29 Vancomycin >> 10/31 10/29 Cefepime >> 11/01 10/29 Metronidazole >> 11/01 11/02 Zosyn >> 11/3  Microbiology results: 10/30 MRSA PCR: (-) 10/31 Wound (2nd metatarsal): strep mitis/oralis, pasteurella, MSSA, bacteroides 10/31 Wound (metatarsal): bacteroides, peptostreptococcus micros 10/31 Abscess (left foot): MSSA, pasteurella 11/2 Bcx Ng5d (final)  Thank you for allowing pharmacy to be a part of this patient's care.  Wynelle Cleveland, PharmD Pharmacy Resident  11/25/2020 4:46 PM

## 2020-11-26 DIAGNOSIS — K922 Gastrointestinal hemorrhage, unspecified: Secondary | ICD-10-CM

## 2020-11-26 LAB — CBC
HCT: 27.6 % — ABNORMAL LOW (ref 36.0–46.0)
HCT: 29.2 % — ABNORMAL LOW (ref 36.0–46.0)
Hemoglobin: 8.8 g/dL — ABNORMAL LOW (ref 12.0–15.0)
Hemoglobin: 9.3 g/dL — ABNORMAL LOW (ref 12.0–15.0)
MCH: 30.3 pg (ref 26.0–34.0)
MCH: 30.1 pg (ref 26.0–34.0)
MCHC: 31.9 g/dL (ref 30.0–36.0)
MCHC: 31.8 g/dL (ref 30.0–36.0)
MCV: 95.2 fL (ref 80.0–100.0)
MCV: 94.5 fL (ref 80.0–100.0)
Platelets: 85 10*3/uL — ABNORMAL LOW (ref 150–400)
Platelets: 96 10*3/uL — ABNORMAL LOW (ref 150–400)
RBC: 2.9 MIL/uL — ABNORMAL LOW (ref 3.87–5.11)
RBC: 3.09 MIL/uL — ABNORMAL LOW (ref 3.87–5.11)
RDW: 18.1 % — ABNORMAL HIGH (ref 11.5–15.5)
RDW: 18.1 % — ABNORMAL HIGH (ref 11.5–15.5)
WBC: 8.8 10*3/uL (ref 4.0–10.5)
WBC: 12.4 10*3/uL — ABNORMAL HIGH (ref 4.0–10.5)
nRBC: 0 % (ref 0.0–0.2)
nRBC: 0 % (ref 0.0–0.2)

## 2020-11-26 LAB — BPAM RBC
Blood Product Expiration Date: 202212182359
ISSUE DATE / TIME: 202211142119
Unit Type and Rh: 5100

## 2020-11-26 LAB — BASIC METABOLIC PANEL
Anion gap: 11 (ref 5–15)
BUN: 63 mg/dL — ABNORMAL HIGH (ref 8–23)
CO2: 25 mmol/L (ref 22–32)
Calcium: 8.5 mg/dL — ABNORMAL LOW (ref 8.9–10.3)
Chloride: 105 mmol/L (ref 98–111)
Creatinine, Ser: 3.88 mg/dL — ABNORMAL HIGH (ref 0.44–1.00)
GFR, Estimated: 12 mL/min — ABNORMAL LOW (ref >60)
Glucose, Bld: 103 mg/dL — ABNORMAL HIGH (ref 70–99)
Potassium: 4.2 mmol/L (ref 3.5–5.1)
Sodium: 141 mmol/L (ref 135–145)

## 2020-11-26 LAB — TYPE AND SCREEN
ABO/RH(D): O POS
ABO/RH(D): O POS
Antibody Screen: NEGATIVE
Antibody Screen: NEGATIVE
Unit division: 0

## 2020-11-26 LAB — MAGNESIUM: Magnesium: 1.9 mg/dL (ref 1.7–2.4)

## 2020-11-26 MED ORDER — ALLOPURINOL 100 MG PO TABS
50.0000 mg | ORAL_TABLET | ORAL | Status: DC
  Administered 2020-11-28 – 2020-12-20 (×12): 50 mg via ORAL
  Filled 2020-11-26 (×12): qty 0.5

## 2020-11-26 MED ORDER — PANTOPRAZOLE SODIUM 40 MG IV SOLR
40.0000 mg | Freq: Two times a day (BID) | INTRAVENOUS | Status: DC
  Administered 2020-11-26 – 2020-12-03 (×14): 40 mg via INTRAVENOUS
  Filled 2020-11-26 (×14): qty 40

## 2020-11-26 MED ORDER — ENSURE MAX PROTEIN PO LIQD
11.0000 [oz_av] | Freq: Every day | ORAL | Status: DC
  Administered 2020-11-29 – 2020-12-02 (×3): 11 [oz_av] via ORAL
  Filled 2020-11-26: qty 330

## 2020-11-26 MED ORDER — JUVEN PO PACK
1.0000 | PACK | Freq: Two times a day (BID) | ORAL | Status: DC
  Administered 2020-11-26 – 2020-12-03 (×7): 1 via ORAL

## 2020-11-26 MED ORDER — VENLAFAXINE HCL 25 MG PO TABS
50.0000 mg | ORAL_TABLET | Freq: Two times a day (BID) | ORAL | Status: DC
  Administered 2020-11-26 – 2020-12-21 (×49): 50 mg via ORAL
  Filled 2020-11-26 (×55): qty 2

## 2020-11-26 NOTE — Progress Notes (Signed)
PT Cancellation Note  Patient Details Name: Autumn [name] MRN: [id number] DOB: 02/17/1950   Cancelled Treatment:    Reason Eval/Treat Not Completed: Other (comment). Per RN and OT, hold currently requested due to noted bloody BM, pending MD assessment. PT to re-attempt as able.   Lieutenant Diego PT, DPT 2:59 PM,11/26/20

## 2020-11-26 NOTE — Progress Notes (Signed)
Progress Note  Patient Name: Autumn [name] Date of Encounter: 11/26/2020  [facility] Cardiologist: Nelva Bush, MD   Subjective   She is in SR. Appears to have post-termination pauses, longest 4.68 sec. A&M x2. Denies chest pain.   Inpatient Medications    Scheduled Meds:  sodium chloride   Intravenous Once   [START ON 11/28/2020] allopurinol  50 mg Oral QODAY   apixaban  5 mg Oral BID   vitamin C  500 mg Oral BID   atorvastatin  40 mg Oral Daily   budesonide (PULMICORT) nebulizer solution  0.5 mg Nebulization BID   diltiazem  240 mg Oral Daily   diltiazem  10 mg Intravenous Once   docusate  100 mg Oral BID   ferrous gluconate  324 mg Oral Q breakfast   hydrALAZINE  50 mg Oral Q8H   levothyroxine  100 mcg Oral Q0600   mouth rinse  15 mL Mouth Rinse BID   metoprolol tartrate  50 mg Oral BID   multivitamin with minerals  1 tablet Oral Daily   nutrition supplement (JUVEN)  1 packet Oral BID BM   pantoprazole (PROTONIX) IV  40 mg Intravenous Daily   polyethylene glycol  17 g Oral Daily   Ensure Max Protein  11 oz Oral QHS   sodium chloride flush  3 mL Intravenous Q12H   sodium chloride flush  3 mL Intravenous Q12H   venlafaxine  50 mg Oral BID   Continuous Infusions:  sodium chloride Stopped (11/23/20 2305)   sodium chloride 250 mL (11/26/20 0942)   sodium chloride 250 mL (11/25/20 1208)   ampicillin-sulbactam (UNASYN) IV 3 g (11/26/20 0946)   PRN Meds: sodium chloride, sodium chloride, sodium chloride, [DISCONTINUED] acetaminophen **OR** acetaminophen, acetaminophen, albuterol, diltiazem, hydrALAZINE, meclizine, ondansetron **OR** ondansetron (ZOFRAN) IV, ondansetron (ZOFRAN) IV, sodium chloride flush, sodium chloride flush   Vital Signs    Vitals:   11/26/20 0720 11/26/20 0800 11/26/20 0822 11/26/20 1127  BP: (!) 175/70   (!) 149/79  Pulse: 66  (!) 115 64  Resp: 18  16 18   Temp: 98.3 F (36.8 C)   98.5 F (36.9 C)  TempSrc:      SpO2: 99% 97%  98% 97%  Weight:      Height:        Intake/Output Summary (Last 24 hours) at 11/26/2020 1214 Last data filed at 11/26/2020 1006 Gross per 24 hour  Intake 2072.95 ml  Output 2150 ml  Net -77.05 ml   Last 3 Weights 11/26/2020 11/25/2020 11/25/2020  Weight (lbs) 207 lb 3.7 oz 211 lb 10.3 oz 211 lb 10.3 oz  Weight (kg) 94 kg 96 kg 96 kg      Telemetry    Afib>SR/SB post termination pauses longest 4.68 seconds - Personally Reviewed  ECG    No new - Personally Reviewed  Physical Exam   GEN: No acute distress.   Neck: No JVD Cardiac: RRR, no murmurs, rubs, or gallops.  Respiratory: Clear to auscultation bilaterally. GI: Soft, nontender, non-distended  MS: No edema; No deformity. Neuro:  Nonfocal  Psych: Normal affect   Labs    High Sensitivity Troponin:   Recent Labs  Lab 11/09/20 1220 11/09/20 1818 11/10/20 0115 11/10/20 0649 11/21/20 0510  TROPONINIHS 147* 151* 162* 149* 81*     Chemistry Recent Labs  Lab 11/20/20 0504 11/21/20 0510 11/24/20 0500 11/25/20 0430 11/26/20 0057  NA 142   < > 140 142 141  K 5.0   < >  4.6 4.6 4.2  CL 107   < > 105 109 105  CO2 26   < > 23 25 25   GLUCOSE 132*   < > 113* 135* 103*  BUN 81*   < > 66* 65* 63*  CREATININE 3.32*   < > 3.62* 3.69* 3.88*  CALCIUM 9.0   < > 8.7* 8.5* 8.5*  MG 2.1   < > 2.0 2.0 1.9  PROT 5.5*  --   --   --   --   ALBUMIN 2.3*  --   --   --   --   AST 20  --   --   --   --   ALT 16  --   --   --   --   ALKPHOS 58  --   --   --   --   BILITOT 0.6  --   --   --   --   GFRNONAA 15*   < > 13* 13* 12*  ANIONGAP 9   < > 12 8 11    < > = values in this interval not displayed.    Lipids No results for input(s): CHOL, TRIG, HDL, LABVLDL, LDLCALC, CHOLHDL in the last 168 hours.  Hematology Recent Labs  Lab 11/24/20 0500 11/25/20 0430 11/26/20 0057  WBC 9.1 7.3 8.8  RBC 2.65* 2.43* 2.90*  HGB 7.9* 7.3* 8.8*  HCT 25.0* 23.2* 27.6*  MCV 94.3 95.5 95.2  MCH 29.8 30.0 30.3  MCHC 31.6 31.5 31.9  RDW  17.9* 18.1* 18.1*  PLT 99* 82* 85*   Thyroid No results for input(s): TSH, FREET4 in the last 168 hours.  BNP Recent Labs  Lab 11/21/20 0510  BNP 589.6*    DDimer No results for input(s): DDIMER in the last 168 hours.   Radiology    No results found.  Cardiac Studies   2D Echocardiogram 10.30.2022    1. Left ventricular ejection fraction, by estimation, is 55 to 60%. The  left ventricle has normal function. The left ventricle has no regional  wall motion abnormalities. There is mild concentric left ventricular  hypertrophy. Left ventricular diastolic  parameters are indeterminate. Elevated left ventricular end-diastolic  pressure.   2. Right ventricular systolic function is normal. The right ventricular  size is normal. There is normal pulmonary artery systolic pressure.   3. Left atrial size was severely dilated.   4. The mitral valve is normal in structure. Trivial mitral valve  regurgitation. No evidence of mitral stenosis.   5. The aortic valve is tricuspid. Aortic valve regurgitation is not  visualized. No aortic stenosis is present.   6. The inferior vena cava is normal in size with greater than 50%  respiratory variability, suggesting right atrial pressure of 3 mmHg.   7. There is a small patent foramen ovale.   Patient Profile     70 y.o. female with a hx of HFpEF, CKD stage IV-V, diabetes since her late 60s to early 55s with diabetic neuropathy and diabetic foot ulcer, HTN, anemia of chronic disease, hepatic cirrhosis, sleep apnea, and gout who was admitted October 29th and has had a complex hospital course in the setting of acute stroke, rapid atrial fibrillation, left foot osteomyelitis, blood loss anemia, respiratory failure, volume overload, acute kidney injury, and sepsis.  Assessment & Plan   Afib with RVR - initially difficult to rate control but ultimately converted to SR - she remains in SR/SB - diltiazem decreased to 360mg  daily - given  post-termination pauses may need AAA to keep patient out of Afib - Eliquis 5mg  BID  Acute respiratory failure - Bipap  Demand ischemia/elevated troponin - In the setting of Afib RVR, CKD stage 4, sepsis, osteomyelitis, and stroke - HS trop peaked at 162 - Echo this admission showed EF 55-60% - Lopressor 100mg  BID - continue statin 40mg  daily  Sepsis/osteomyelitis - abx per ID - VVS following  Acute stroke - presumed embolic in the setting of Afib, on Eliquis  Stage IV/CKD - Scr 3.19 baseline - today 3.88 - nephrology following  Normocytic anemia - in the setting of heparin and recent surgical procedure - s/p pRBC - Hgb 7.3>8.8  For questions or updates, please contact [facility] Please consult [email or web address] for contact info under        Signed, Nickolaus Bordelon Ninfa Meeker, PA-C  11/26/2020, 12:14 PM

## 2020-11-26 NOTE — Progress Notes (Signed)
PROGRESS NOTE    Autumn [name]  WIO:[id number] DOB: 1950-03-08 DOA: 11/09/2020 PCP: Harlow Ohms, MD  [id number]   Assessment & Plan:   Principal Problem:   Sepsis [facility]) Active Problems:   Atrial fibrillation with RVR ([place])   Osteomyelitis of foot, left, acute ([place])   CKD stage 4 due to type 2 diabetes mellitus ([place])   Liver cirrhosis secondary to NASH (nonalcoholic steatohepatitis) ([place])   Hypertension   History of anemia due to CKD   Frequent falls   AKI (acute kidney injury) ([place])   Cellulitis and abscess of foot, except toes   Infectious tenosynovitis   Wheeze   Atrial fibrillation with rapid ventricular response ([facility])   Bilateral carotid artery stenosis   70 yo F with history of T2DM, CKD, 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  She'd been following with a [facility] podiatrist outpatient and had recently refused surgery.  She'd recently been treated with antibiotics at [facility] for the diabetic foot infection with enterobacter aerogans culture positive.  She was admitted on 10/29 due to falls at home with worsening L foot chronic diabetic ulcer with osteomyelitis of the 2nd and 3rd metatarsals and cellulitis noted on MRI.  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  Her post operative course was complicated by unresponsiveness and neurologic changes, she was found to have a stroke and was intubated for airway protection.  She was extubated 11/3 and transferred to [facility] service on 11/4.   Acute Blood Loss Anemia Iron def Anemia of CKD In setting of heparin and recent surgical procedure - postop bleeding from surgical wound --s/p 2 units pRBC, Hgb stable in 8's. Labs with iron def, vit B12 elevated, folate wnl.  Then Hgb dropped 1g in the last 2 days in the setting of taking Eliquis, received another unit of blood early morning 11/15. Plan: --monitor  Hgb, transfuse to keep Hgb >7 --cont iron suppl --Hold Eliquis --GI consult today  GI bleed --melena with blood clots seen today.  Pt has been on Eliquis for 2ndary stroke prevention due to embolic stroke suffered during this hospitalization. Plan: --Hold Eliquis --increase IV PPI to BID --GI consult today, plan for EGD tomorrow   Stroke Expressive aphasia, improved Neurology suspects periprocedural vs related to atrial fibrillation MRI brian with acute to early subacute infarction in the L posterior frontal cortical and subcortical brain, possibly affecting the precentral gyrus.   --Echo with EF 55-60%, dilated LA, patent foramen ovale --carotid US right ICA artery worrisome for a subtotal occlusion.  Moderate to large amount of L sided atheroscelotic plaque, however, Left carotid angiogram with Dr. Delana Meyer  showed no significant stenosis. --Pt cleared to resume Eliquis on 11/11 for 2ndary stroke prevention. Plan: --hold eliquis today due to GI bleed --cont statin   Acute Metabolic Encephalopathy Related to acute infection and above Delirium precautions Follow and w/u further as indicated   Septic Shock Diabetic Foot Infection Left Foot Osteomyelitis and Cellulitis  Septic Arthritis MRI foot with septic arthritis of second MTP joint and osteomyelitis of 2nd metatarsal head and second proximal phalanx, severe soft tissue swelling around second metatarsal neck concerning for phlegmon developing abscess measuring 2.1x1 cm circumferentially around distal shaft.  Mild early osteomyelitis of 3rd metatarsal head.  Severe soft tissue edema of the forefoot c/w cellulitis. MRI ankle limited -> interval resection of 2nd metatarsal, longitudinal  tear of peroneus brevis with peroneus tenosynovitis, distal tibialis posterior tenosynovitis and tendinopathy, extensor digitorum tenosynovitis.  Extensive subcutaneous edema circumferentially in distal calf.  Dorsal subcutaneous edema in foot. S/p 2nd  ray amputation, I&D, and bone bx of 3rd metatarsal on 11/1 --ID consulted Plan: --cont IV unasyn for 4 weeks until 12/09/20 -followed by PO augmentin until 12/23/20 --NWB LLE --cont wound VAC --ID followup as outpatient   Acute Hypoxic Respiratory Failure Aspiration Pneumonia, treated Pulm edema 2/2 fluid overload CXR 11/3 with right lung opacity concerning for superimposed infection - also with findings concerning for pulmonary edema and small right effusion --aspiration PNA considered treated with IV Unasyn --rapid called early morning 11/10 for respiratory distress.  CXR showed pulm edema (pt had received MIVF for 4 days before I d/c'ed it yesterday).  Started on BiPAP, and received IV lasix 40 x1, 80 x1, metolazone x1 --currently on 2L, BiPAP at night Plan: --Continue supplemental O2 to keep sats >=90%, wean as tolerated --BiPAP nightly only for likely OSA --diuretic per nephrology given CKD4  Atrial Fibrillation with RVR --went into RVR due to meds on hold for NPO status and procedures --cardiology increased rate control agents, now HR controlled.  Lopressor has been held half of the time due to low HR. Plan: --cont Cardizem 240 mg daily --d/c Lopressor today --hold eliquis due to GI bleed   HTN --BP trending up --cont Cardizem 240 mg daily --d/c Lopressor today due to bradycardia --cont hydralazine 50 mg q8h --allow BP to run on the high side due to current GI bleed  Thrombocytopenia --HIT ab neg.   Dysphagia 2/2 stroke --SLP following --cont Dys 3, thin liquid   AKI on CKD IV Baseline creatinine around 3.2. Chronic kidney disease secondary to diabetes.  Acute kidney injury secondary to ATN. --nephrology following --hold home Lisinopril and Bumex   Elevated Troponin Cardiology following, thought 2/2 demand   Hx NASH cirrhosis --compensated    Hypothyroidism --cont Synthroid   T2DM A1c 6.3 --BG has been within inpatient goal, has not needed SSI. --no  need for BG checks and SSI   DVT prophylaxis: SCD/Compression stockings Code Status: Full code  Family Communication:   Level of care: Progressive Cardiac Dispo:   The patient is from: home Anticipated d/c is to: SNF Anticipated d/c date is: undetermined Patient currently is not medically ready to d/c due to: GI bleed, pending endoscopy    Subjective and Interval History:  Gave 1u pRBC last night with appropriate rise of Hgb to 8's.  Pt had no new complaint today.    Had a BM that was melena and blood clots, I personally saw and confirmed.  Eliquis held, GI consulted today.     Objective: Vitals:   11/26/20 0822 11/26/20 1127 11/26/20 1319 11/26/20 1545  BP:  (!) 149/79 (!) 143/89 (!) 156/74  Pulse: (!) 115 64  66  Resp: 16 18  19   Temp:  98.5 F (36.9 C)  98.6 F (37 C)  TempSrc:      SpO2: 98% 97%  98%  Weight:      Height:        Intake/Output Summary (Last 24 hours) at 11/26/2020 1648 Last data filed at 11/26/2020 1630 Gross per 24 hour  Intake 1832.95 ml  Output 2000 ml  Net -167.05 ml    Filed Weights   11/25/20 0024 11/25/20 0242 11/26/20 0600  Weight: 96 kg 96 kg 94 kg    Examination:   Constitutional: NAD, AAOx3 HEENT:  conjunctivae and lids normal, EOMI CV: No cyanosis.   RESP: normal respiratory effort, on 2L Extremities: LLE swelling and erythema improved, left foot connected to wound vac SKIN: warm, dry Neuro: II - XII grossly intact.   Psych: depressed mood and affect.     Data Reviewed: I have personally reviewed following labs and imaging studies  CBC: Recent Labs  Lab 11/20/20 0504 11/21/20 0510 11/23/20 0333 11/24/20 0500 11/25/20 0430 11/26/20 0057 11/26/20 1524  WBC 10.7*   < > 9.2 9.1 7.3 8.8 12.4*  NEUTROABS 7.9*  --   --   --   --   --   --   HGB 8.4*   < > 8.4* 7.9* 7.3* 8.8* 9.3*  HCT 26.5*   < > 27.3* 25.0* 23.2* 27.6* 29.2*  MCV 93.3   < > 95.8 94.3 95.5 95.2 94.5  PLT 108*   < > 123* 99* 82* 85* 96*   < > =  values in this interval not displayed.   Basic Metabolic Panel: Recent Labs  Lab 11/20/20 0504 11/21/20 0510 11/22/20 0709 11/23/20 0333 11/24/20 0500 11/25/20 0430 11/26/20 0057  NA 142   < > 141 142 140 142 141  K 5.0   < > 5.1 4.8 4.6 4.6 4.2  CL 107   < > 107 105 105 109 105  CO2 26   < > 25 26 23 25 25   GLUCOSE 132*   < > 124* 133* 113* 135* 103*  BUN 81*   < > 65* 72* 66* 65* 63*  CREATININE 3.32*   < > 3.54* 3.56* 3.62* 3.69* 3.88*  CALCIUM 9.0   < > 8.8* 8.8* 8.7* 8.5* 8.5*  MG 2.1   < > 2.0 1.8 2.0 2.0 1.9  PHOS 4.0  --   --   --   --   --   --    < > = values in this interval not displayed.   GFR: Estimated Creatinine Clearance: 16 mL/min (A) (by C-G formula based on SCr of 3.88 mg/dL (H)). Liver Function Tests: Recent Labs  Lab 11/20/20 0504  AST 20  ALT 16  ALKPHOS 58  BILITOT 0.6  PROT 5.5*  ALBUMIN 2.3*   No results for input(s): LIPASE, AMYLASE in the last 168 hours. No results for input(s): AMMONIA in the last 168 hours. Coagulation Profile: Recent Labs  Lab 11/22/20 0709  INR 1.3*   Cardiac Enzymes: No results for input(s): CKTOTAL, CKMB, CKMBINDEX, TROPONINI in the last 168 hours. BNP (last 3 results) No results for input(s): PROBNP in the last 8760 hours. HbA1C: No results for input(s): HGBA1C in the last 72 hours. CBG: Recent Labs  Lab 11/23/20 0822 11/23/20 1125 11/23/20 1618 11/24/20 0825 11/24/20 1304  GLUCAP 122* 157* 120* 107* 132*   Lipid Profile: No results for input(s): CHOL, HDL, LDLCALC, TRIG, CHOLHDL, LDLDIRECT in the last 72 hours. Thyroid Function Tests: No results for input(s): TSH, T4TOTAL, FREET4, T3FREE, THYROIDAB in the last 72 hours. Anemia Panel: No results for input(s): VITAMINB12, FOLATE, FERRITIN, TIBC, IRON, RETICCTPCT in the last 72 hours. Sepsis Labs: Recent Labs  Lab 11/21/20 0510  PROCALCITON 0.10    No results found for this or any previous visit (from the past 240 hour(s)).     Radiology  Studies: No results found.   Scheduled Meds:  sodium chloride   Intravenous Once   [START ON 11/28/2020] allopurinol  50 mg Oral QODAY   vitamin C  500 mg Oral  BID   atorvastatin  40 mg Oral Daily   budesonide (PULMICORT) nebulizer solution  0.5 mg Nebulization BID   diltiazem  240 mg Oral Daily   diltiazem  10 mg Intravenous Once   docusate  100 mg Oral BID   ferrous gluconate  324 mg Oral Q breakfast   hydrALAZINE  50 mg Oral Q8H   levothyroxine  100 mcg Oral Q0600   mouth rinse  15 mL Mouth Rinse BID   metoprolol tartrate  50 mg Oral BID   multivitamin with minerals  1 tablet Oral Daily   nutrition supplement (JUVEN)  1 packet Oral BID BM   pantoprazole (PROTONIX) IV  40 mg Intravenous Daily   polyethylene glycol  17 g Oral Daily   Ensure Max Protein  11 oz Oral QHS   sodium chloride flush  3 mL Intravenous Q12H   sodium chloride flush  3 mL Intravenous Q12H   venlafaxine  50 mg Oral BID   Continuous Infusions:  sodium chloride Stopped (11/23/20 2305)   sodium chloride 250 mL (11/26/20 0942)   sodium chloride 250 mL (11/25/20 1208)   ampicillin-sulbactam (UNASYN) IV 3 g (11/26/20 0946)     LOS: 17 days     Enzo Bi, MD [facility] If 7PM-7AM, please contact night-coverage 11/26/2020, 4:48 PM

## 2020-11-26 NOTE — Progress Notes (Signed)
Nutrition Follow-up  DOCUMENTATION CODES:   Obesity unspecified  INTERVENTION:   -D/c Ensure Enlive -1 packet Juven BID, each packet provides 95 calories, 2.5 grams of protein (collagen), and 9.8 grams of carbohydrate (3 grams sugar); also contains 7 grams of L-arginine and L-glutamine, 300 mg vitamin C, 15 mg vitamin E, 1.2 mcg vitamin B-12, 9.5 mg zinc, 200 mg calcium, and 1.5 g  Calcium Beta-hydroxy-Beta-methylbutyrate to support wound healing -Continue MVI with minerals daily -Ensure Max po daily, each supplement provides 150 kcal and 30 grams of protein.    NUTRITION DIAGNOSIS:   Increased nutrient needs related to wound healing as evidenced by estimated needs.  Ongoing  GOAL:   Patient will meet greater than or equal to 90% of their needs  Progressing   MONITOR:   PO intake, Supplement acceptance, Diet advancement, Labs, Weight trends, Skin, I & O's  REASON FOR ASSESSMENT:   Consult Wound healing  ASSESSMENT:   Patient is a 70 year old female who presents to the ER for evaluation of frequent falls and is found to be septic from left foot osteomyelitis and also in rapid A. fib.  10/31- s/p  Procedure(s): AMPUTATION RAY - 2nd, bone biopsy 3rd metatarsal head 11/2- intubated 11/3- MRI of brain revealed acute lt precentral gyrus stroke, extubated 11/4- s/p BSE- advanced to dysphagia 1 diet with nectar thick liquids 11/8- s/p BSE- advanced to dysphagia 2 diet with thin liquids, NGT d/c 11/11- s/p aortogram   Reviewed I/O's: -77 ml x 24 hours and +6.7 L since 11/12/20  UOP: 2.2 L x 24 hours  Pt unavailable at time of visit. Attempted to speak with pt via call to hospital room phone, however, unable to reach.   Pt remains with good appetite. Noted meal completions 15-100%. Pt with minimal acceptance of Ensure supplements.   Medications reviewed and include vitamin C, cardizem CD, colace, and miralax.  Labs reviewed: CBGS: 107-132 (inpatient orders for glycemic  control are none).    Diet Order:   Diet Order             DIET DYS 3 Room service appropriate? Yes with Assist; Fluid consistency: Thin  Diet effective now                   EDUCATION NEEDS:   Education needs have been addressed  Skin:  Skin Assessment: Skin Integrity Issues: Skin Integrity Issues:: Diabetic Ulcer Diabetic Ulcer: lt foot  Last BM:  11/26/20  Height:   Ht Readings from Last 1 Encounters:  11/09/20 5\' 6"  (1.676 m)    Weight:   Wt Readings from Last 1 Encounters:  11/26/20 94 kg    Ideal Body Weight:  59.1 kg  BMI:  Body mass index is 33.45 kg/m.  Estimated Nutritional Needs:   Kcal:  1800-2100kcal/day  Protein:  90-105g/day  Fluid:  1.5-1.8L/day    Loistine Chance, RD, LDN, [place] Registered Dietitian II Certified Diabetes Care and Education Specialist Please refer to AMION for RD and/or RD on-call/weekend/after hours pager

## 2020-11-26 NOTE — Consult Note (Signed)
Consultation  Referring Provider:     Hospitalist Admit date: 11/09/2020 Consult date: 11/26/2020         Reason for Consultation:    GI bleed         HPI:   Autumn [name] is a 70 y.o. lady with multiple medical problems including DM II, chronic kidney disease, diabetic foot ulcer with osteo, NASH cirrhosis, HFpEF, and obesity who is here for diabetic foot infection requiring podiatric intervention and had post procedural stroke for which NOAC was started. Yesterday she had small amount of blood in her stool. Had normal bowel movement this morning but then had large bowel movement this after noon that appears melenic but could be right sided bleeding as the bowel movement was not overly odorous. Patient had EGD in 2017 that was overall normal but colonoscopy could not be completed due to acute angulation in sigmoid colon. She has been on PPI since 11/2. No nausea/vomiting. No abdominal pain. No known family history of GI malignancies. History of hysterectomy. She is hemodynamically stable.    Past Medical History:  Diagnosis Date   Diabetes mellitus without complication ([place])    Hypertension    Kidney stone    Thyroid disease     Past Surgical History:  Procedure Laterality Date   ABDOMINAL HYSTERECTOMY     AMPUTATION Left 11/11/2020   Procedure: AMPUTATION RAY - 2nd metatarsal and 3rd metatarsal head;  Surgeon: [name], DPM;  Location: [facility];  Service: Podiatry;  Laterality: Left;   CAROTID ANGIOGRAPHY Left 11/22/2020   Procedure: CAROTID ANGIOGRAPHY;  Surgeon: Katha Cabal, MD;  Location: [facility];  Service: Cardiovascular;  Laterality: Left;   URETERAL STENT PLACEMENT      Family History  Problem Relation Age of Onset   Hypertension Mother      Social History   Tobacco Use   Smoking status: Former   Smokeless tobacco: Never  Substance Use Topics   Alcohol use: Never   Drug use: Never    Prior to Admission medications   Medication  Sig Start Date End Date Taking? Authorizing Provider  acetaminophen (TYLENOL) 325 MG tablet Take 325 mg by mouth every 6 (six) hours as needed.   Yes [provider]  albuterol (VENTOLIN HFA) 108 (90 Base) MCG/ACT inhaler Inhale 2 puffs into the lungs 4 (four) times daily as needed. 03/25/18  Yes [provider]  allopurinol (ZYLOPRIM) 100 MG tablet Take 200 mg by mouth daily. 10/25/20  Yes [provider]  amLODipine (NORVASC) 5 MG tablet Take 5 mg by mouth daily. 10/11/20  Yes [provider]  aspirin 81 MG EC tablet Take 81 mg by mouth at bedtime. 06/09/06  Yes [provider]  bumetanide (BUMEX) 2 MG tablet Take 4 mg by mouth 2 (two) times daily. 10/29/20 10/29/21 Yes [provider]  cloNIDine (CATAPRES) 0.3 MG tablet Take 0.3 mg by mouth at bedtime. 11/05/20  Yes [provider]  hydrALAZINE (APRESOLINE) 25 MG tablet Take 50 mg by mouth 3 (three) times daily. 09/04/20  Yes [provider]  levothyroxine (SYNTHROID) 100 MCG tablet Take 100 mcg by mouth every morning. 10/04/20  Yes [provider]  liraglutide (VICTOZA) 18 MG/3ML SOPN Inject 1.2 mg into the skin in the morning. 10/07/18 07/03/21 Yes [provider]  lisinopril (ZESTRIL) 20 MG tablet Take 40 mg by mouth daily. 09/28/20  Yes [provider]  potassium chloride SA (KLOR-CON) 20 MEQ tablet Take 20 mEq  by mouth daily. 04/29/20 04/29/21 Yes [provider]  simvastatin (ZOCOR) 20 MG tablet Take 20 mg by mouth every evening. 08/29/18  Yes [provider]  venlafaxine (EFFEXOR) 100 MG tablet Take 100 mg by mouth 2 (two) times daily. 10/21/20  Yes [provider]  meclizine (ANTIVERT) 12.5 MG tablet Take 12.5 mg by mouth 3 (three) times daily as needed. 08/30/20   [provider]  metoCLOPramide (REGLAN) 5 MG tablet Take 5 mg by mouth daily as needed. 11/05/20   [provider]    Current  Facility-Administered Medications  Medication Dose Route Frequency Provider Last Rate Last Admin   0.9 %  sodium chloride infusion (Manually program via Guardrails IV Fluids)   Intravenous Once Schnier, Dolores Lory, MD       0.9 %  sodium chloride infusion   Intravenous PRN Schnier, Dolores Lory, MD   Stopped at 11/23/20 2305   0.9 %  sodium chloride infusion  250 mL Intravenous PRN Schnier, Dolores Lory, MD 10 mL/hr at 11/26/20 0942 250 mL at 11/26/20 0942   0.9 %  sodium chloride infusion  250 mL Intravenous PRN Katha Cabal, MD 10 mL/hr at 11/25/20 1208 250 mL at 11/25/20 1208   acetaminophen (TYLENOL) suppository 650 mg  650 mg Rectal Q6H PRN Schnier, Dolores Lory, MD       acetaminophen (TYLENOL) tablet 650 mg  650 mg Per Tube Q6H PRN Schnier, Dolores Lory, MD       albuterol (PROVENTIL) (2.5 MG/3ML) 0.083% nebulizer solution 2.5 mg  2.5 mg Nebulization Q4H PRN Schnier, Dolores Lory, MD   2.5 mg at 11/25/20 2043   [START ON 11/28/2020] allopurinol (ZYLOPRIM) tablet 50 mg  50 mg Oral QODAY Beers, Brandon D, RPH       Ampicillin-Sulbactam (UNASYN) 3 g in sodium chloride 0.9 % 100 mL IVPB  3 g Intravenous Q12H Schnier, Dolores Lory, MD 200 mL/hr at 11/26/20 0946 3 g at 11/26/20 0946   ascorbic acid (VITAMIN C) tablet 500 mg  500 mg Oral BID Enzo Bi, MD   500 mg at 11/26/20 0920   atorvastatin (LIPITOR) tablet 40 mg  40 mg Oral Daily Theora Gianotti, NP   40 mg at 11/26/20 0920   budesonide (PULMICORT) nebulizer solution 0.5 mg  0.5 mg Nebulization BID Katha Cabal, MD   0.5 mg at 11/26/20 6160   diltiazem (CARDIZEM CD) 24 hr capsule 240 mg  240 mg Oral Daily Kathlyn Sacramento A, MD   240 mg at 11/26/20 0920   diltiazem (CARDIZEM) injection 10 mg  10 mg Intravenous Once Enzo Bi, MD       diltiazem (CARDIZEM) injection 5 mg  5 mg Intravenous Q2H PRN Schnier, Dolores Lory, MD   5 mg at 11/22/20 1745   docusate (COLACE) 50 MG/5ML liquid 100 mg  100 mg Oral BID Enzo Bi, MD   100 mg at 11/26/20 0919    ferrous gluconate (FERGON) tablet 324 mg  324 mg Oral Q breakfast Schnier, Dolores Lory, MD   324 mg at 11/26/20 7371   hydrALAZINE (APRESOLINE) injection 5 mg  5 mg Intravenous Q6H PRN Schnier, Dolores Lory, MD   5 mg at 11/22/20 1350   hydrALAZINE (APRESOLINE) tablet 50 mg  50 mg Oral Q8H Enzo Bi, MD   50 mg at 11/26/20 1321   levothyroxine (SYNTHROID) tablet 100 mcg  100 mcg Oral Q0600 Enzo Bi, MD   100 mcg at 11/26/20 0550   meclizine (ANTIVERT)  tablet 12.5 mg  12.5 mg Per Tube TID PRN Schnier, Dolores Lory, MD       MEDLINE mouth rinse  15 mL Mouth Rinse BID Delana Meyer, Dolores Lory, MD   15 mL at 11/25/20 2256   metoprolol tartrate (LOPRESSOR) tablet 50 mg  50 mg Oral BID Enzo Bi, MD   50 mg at 11/26/20 0920   multivitamin with minerals tablet 1 tablet  1 tablet Oral Daily Schnier, Dolores Lory, MD   1 tablet at 11/26/20 3790   nutrition supplement (JUVEN) (JUVEN) powder packet 1 packet  1 packet Oral BID BM Enzo Bi, MD   1 packet at 11/26/20 1316   ondansetron (ZOFRAN) tablet 4 mg  4 mg Per Tube Q6H PRN Schnier, Dolores Lory, MD       Or   ondansetron [facility]) injection 4 mg  4 mg Intravenous Q6H PRN Schnier, Dolores Lory, MD       ondansetron [facility]) injection 4 mg  4 mg Intravenous Q6H PRN Schnier, Dolores Lory, MD       pantoprazole (PROTONIX) injection 40 mg  40 mg Intravenous Daily Schnier, Dolores Lory, MD   40 mg at 11/26/20 0917   polyethylene glycol (MIRALAX / GLYCOLAX) packet 17 g  17 g Oral Daily Enzo Bi, MD   17 g at 11/26/20 0916   protein supplement (ENSURE MAX) liquid  11 oz Oral QHS Enzo Bi, MD       sodium chloride flush (NS) 0.9 % injection 3 mL  3 mL Intravenous Q12H Schnier, Dolores Lory, MD   3 mL at 11/26/20 0924   sodium chloride flush (NS) 0.9 % injection 3 mL  3 mL Intravenous PRN Schnier, Dolores Lory, MD       sodium chloride flush (NS) 0.9 % injection 3 mL  3 mL Intravenous Q12H Schnier, Dolores Lory, MD   3 mL at 11/26/20 0925   sodium chloride flush (NS) 0.9 % injection 3 mL  3 mL  Intravenous PRN Schnier, Dolores Lory, MD       venlafaxine [facility]) tablet 50 mg  50 mg Oral BID Beers, Shanon Brow, RPH        Allergies as of 11/09/2020 - Review Complete 11/09/2020  Allergen Reaction Noted   Codeine Nausea And Vomiting 02/08/2013     Review of Systems:    All systems reviewed and negative except where noted in HPI.  Review of Systems  Constitutional:  Positive for malaise/fatigue. Negative for fever.  Cardiovascular:  Negative for chest pain.  Gastrointestinal:  Positive for blood in stool. Negative for abdominal pain, constipation, nausea and vomiting.  Musculoskeletal:  Positive for joint pain.  Skin:  Negative for itching and rash.  Neurological:  Positive for speech change. Negative for focal weakness.  Psychiatric/Behavioral:  Negative for substance abuse.   All other systems reviewed and are negative.     Physical Exam:  Vital signs in last 24 hours: Temp:  [97.6 F (36.4 C)-99.5 F (37.5 C)] 98.6 F (37 C) (11/15 1545) Pulse Rate:  [50-115] 66 (11/15 1545) Resp:  [14-20] 19 (11/15 1545) BP: (99-177)/(65-89) 156/74 (11/15 1545) SpO2:  [97 %-100 %] 98 % (11/15 1545) Weight:  [94 kg] 94 kg (11/15 0600) Last BM Date: 11/26/20 (1450 pm. Blood in stool) General:   Chronically ill appearing lady Head:  Normocephalic and atraumatic. Eyes:   No icterus.   Conjunctiva pink. Mouth: Mucosa pink moist, no lesions. Neck:  Supple; no masses felt Lungs:  On O2 but  no respiratory distress Heart:  RRR Abdomen:   soft, nondistended, nontender Msk:  MAEW x4, No clubbing or cyanosis. Strength 5/5. Symmetrical without gross deformities. Neurologic:  Alert and  oriented x4;  Cranial nerves II-XII intact.  Skin:  Warm, dry, pink without significant lesions or rashes. Psych:  Alert and cooperative. Normal affect.  LAB RESULTS: Recent Labs    11/25/20 0430 11/26/20 0057 11/26/20 1524  WBC 7.3 8.8 12.4*  HGB 7.3* 8.8* 9.3*  HCT 23.2* 27.6* 29.2*  PLT 82* 85*  96*   BMET Recent Labs    11/24/20 0500 11/25/20 0430 11/26/20 0057  NA 140 142 141  K 4.6 4.6 4.2  CL 105 109 105  CO2 23 25 25   GLUCOSE 113* 135* 103*  BUN 66* 65* 63*  CREATININE 3.62* 3.69* 3.88*  CALCIUM 8.7* 8.5* 8.5*   LFT No results for input(s): PROT, ALBUMIN, AST, ALT, ALKPHOS, BILITOT, BILIDIR, IBILI in the last 72 hours. PT/INR No results for input(s): LABPROT, INR in the last 72 hours.  STUDIES: No results found.     Impression / Plan:   70 y.o. lady with multiple medical problems including DM II, chronic kidney disease, diabetic foot ulcer with osteo, NASH cirrhosis, HFpEF, and obesity who is here for diabetic foot infection requiring podiatric intervention and had post procedural stroke for which NOAC was started who started having possible GI bleed today. Differential includes upper given melenic appearing stool versus right sided diverticular bleeding  - continue PPI - continue antibiotics given - NPO at midnight, will plan for EGD tomorrow - if EGD is negative, then would need colonoscopy - if recurrent bleeding then would get tagged RBC scan if possible as kidney function not well enough for contrast - maintain active type and screen and transfuse for hemoglobin < 7 - continue to hold NOAC - check INR tomorrow - monitor for any recurrent bleeding  Please call with any questions or concerns.  Raylene Miyamoto MD, MPH [place]

## 2020-11-26 NOTE — TOC Progression Note (Signed)
Transition of Care [facility]) - Progression Note    Patient Details  Name: Autumn [name] MRN: [id number] Date of Birth: 25-May-1950  Transition of Care [facility] CM/SW [place], [place] Phone Number: 11/26/2020, 2:41 PM  Clinical Narrative:     CSW spoke with patient's friend [name] who reports her and patient in agreement with SNF since they are aware CIR is not covered in insurance. Agreeable for CSW to fax out referrals for bed offers.   Referrals have been sent pending bed offers. Insurance may potentially be a barrier in bed offers.   Expected Discharge Plan: [place] Barriers to Discharge: Continued Medical Work up  Expected Discharge Plan and Services Expected Discharge Plan: [place] arrangements for the past 2 months: Single Family Home                                       Social Determinants of Health (SDOH) Interventions    Readmission Risk Interventions No flowsheet data found.

## 2020-11-26 NOTE — Progress Notes (Signed)
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 Other comorbidities include sepsis without shock secondary to osteomyelitis and cellulitis left foot (IV zosyn), AKI on CKD, rapid afib (Cardizem + Heparin gtt), NSTEMI suspect demand ischemia, anemia acute on chronic. Transferred to ICU and intubated 11/2 after evolving neuro changes with unresponsiveness after coughing episode. 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   OT comments  Autumn [name] was seen for OT treatment on this date. RN and OT arriving to room with cardiologist reporting pt had bloody BM in bed. MD notified and in to assess. Pt requires MAX A perihygiene at bed level, SUPERVISION for rolling L+R. MIN A don/doff gown at bed level. O2 found to be removed from wall following rolling - pt SOB on RA, resolved to 91% on 2.5 L [place]. Pt left bed level with RN at bedside, deferred further mobility at RN request for dressing completion. Pt making progress toward goals. Pt continues to benefit from skilled OT services to maximize return to PLOF and minimize risk of future falls, injury, caregiver burden, and readmission. Will continue to follow POC. Discharge recommendation remains appropriate.     Recommendations for follow up therapy are one component of a multi-disciplinary discharge planning process, led by the attending physician.  Recommendations may be updated based on patient status, additional functional criteria and insurance  authorization.    Follow Up Recommendations  Acute inpatient rehab (3hours/day)    Assistance Recommended at Discharge Frequent or constant Supervision/Assistance  Equipment Recommendations  Other (comment) (defer to next venue of care)    Recommendations for Other Services      Precautions / Restrictions Precautions Precautions: Fall Restrictions Weight Bearing Restrictions: Yes LLE Weight Bearing: Non weight bearing Other Position/Activity Restrictions: wound vac       Mobility Bed Mobility Overal bed mobility: Needs Assistance Bed Mobility: Rolling Rolling: Min guard         General bed mobility comments: +2 scooting higher in bed    Transfers                   General transfer comment: deferred at RN request - changing dressings following bloody BM         ADL either performed or assessed with clinical judgement   ADL Overall ADL's : Needs assistance/impaired                                       General ADL Comments: MAX A perihygiene at bed level. MIN A don/doff gown at bed level. MAX A rolling at bed level for periaccess.       Cognition Arousal/Alertness: Awake/alert Behavior During Therapy: WFL for tasks assessed/performed Overall Cognitive Status: Impaired/Different from baseline Area of Impairment: Following commands;Safety/judgement                       Following Commands: Follows multi-step commands with increased time Safety/Judgement: Decreased awareness of deficits;Decreased awareness of safety  Exercises Exercises: Other exercises Other Exercises Other Exercises: Pt educ re: OT role, importance of mobility for skin protection Other Exercises: rolling L+R, sidelying tolerance, perihygiene, don/doff gown   Shoulder Instructions       General Comments O2 found to be removed from wall - pt SOB on RA, resolved to 91% on 2.5 L [place]    Pertinent Vitals/ Pain       Pain Assessment:  Faces Faces Pain Scale: Hurts little more Pain Location: groin following pericare Pain Descriptors / Indicators: Grimacing Pain Intervention(s): Limited activity within patient's tolerance;Repositioned   Frequency  Min 5X/week        Progress Toward Goals  OT Goals(current goals can now be found in the care plan section)  Progress towards OT goals: Progressing toward goals  Acute Rehab OT Goals OT Goal Formulation: With patient Time For Goal Achievement: 11/28/20 Potential to Achieve Goals: Good ADL Goals Pt Will Perform Grooming: with min assist;sitting Pt/caregiver will Perform Home Exercise Program: Increased ROM;Increased strength;Both right and left upper extremity;With minimal assist Additional ADL Goal #1: Pt will follow 3/3 two-step commands with MOD cueing.  Plan Discharge plan remains appropriate;Frequency remains appropriate       AM-PAC OT "6 Clicks" Daily Activity     Outcome Measure   Help from another person eating meals?: A Little Help from another person taking care of personal grooming?: A Little Help from another person toileting, which includes using toliet, bedpan, or urinal?: A Lot Help from another person bathing (including washing, rinsing, drying)?: A Lot Help from another person to put on and taking off regular upper body clothing?: A Little Help from another person to put on and taking off regular lower body clothing?: A Lot 6 Click Score: 15    End of Session Equipment Utilized During Treatment: Oxygen  OT Visit Diagnosis: Other abnormalities of gait and mobility (R26.89);Hemiplegia and hemiparesis;Muscle weakness (generalized) (M62.81) Hemiplegia - Right/Left: Right Hemiplegia - dominant/non-dominant: Dominant Hemiplegia - caused by: Cerebral infarction   Activity Tolerance Patient tolerated treatment well   Patient Left in bed;with call bell/phone within reach;with nursing/sitter in room   Nurse Communication          Time:  3013-1438 ([id number]) OT Time Calculation (min): 27 min  Charges: OT General Charges $OT Visit: 1 Visit OT Treatments $Self Care/Home Management : 23-37 mins  Dessie Coma, M.S. OTR/L  11/26/20, 4:14 PM  ascom [phone]

## 2020-11-26 NOTE — Progress Notes (Signed)
Patient's wound dressing was changed today per orders. Previous dressing removed and skin assessed under dressing. Tendon visible. Surgical incision with sutures. Pink borders. No bleeding but some redness. Edema +3 in Lower Left Extremity.  Patient mentions no pain.

## 2020-11-26 NOTE — Progress Notes (Signed)
Gastroenterologist just saw patient. Physician will perform endocopy tomorrow.   Patient not to have anything to eat after midnight.

## 2020-11-26 NOTE — NC FL2 (Signed)
[place] LEVEL OF CARE SCREENING TOOL     IDENTIFICATION  Patient Name: Autumn [name] Birthdate: 29-Apr-1950 Sex: female Admission Date (Current Location): 11/09/2020  [facility] and [place] Number:  Engineering geologist and Address:  [facility], [address] [place], [place] [address]      Provider Number: [id number]  Attending Physician Name and Address:  Enzo Bi, MD  Relative Name and Phone Number:  [name] (friend) [phone]    Current Level of Care: Hospital Recommended Level of Care: [place] Prior Approval Number:    Date Approved/Denied:   PASRR Number: [id number]  Discharge Plan: SNF    Current Diagnoses: Patient Active Problem List   Diagnosis Date Noted   Bilateral carotid artery stenosis    Atrial fibrillation with rapid ventricular response (HCC)    Cellulitis and abscess of foot, except toes    Infectious tenosynovitis    Wheeze    AKI (acute kidney injury) ([place])    Anemia of chronic disease    Hypothyroidism    Atrial fibrillation with RVR ([place]) 11/09/2020   Osteomyelitis of foot, left, acute ([place]) 11/09/2020   CKD stage 4 due to type 2 diabetes mellitus ([place]) 11/09/2020   Liver cirrhosis secondary to NASH (nonalcoholic steatohepatitis) ([place]) 11/09/2020   Hypertension    History of anemia due to CKD    Sepsis ([place])    Frequent falls     Orientation RESPIRATION BLADDER Height & Weight     Self, Time, Place  O2 (2L nasal cannula) Incontinent, External catheter Weight: 207 lb 3.7 oz (94 kg) Height:  5\' 6"  (167.6 cm)  BEHAVIORAL SYMPTOMS/MOOD NEUROLOGICAL BOWEL NUTRITION STATUS      Incontinent Diet (see discharge summary)  AMBULATORY STATUS COMMUNICATION OF NEEDS Skin   Extensive Assist Verbally Other (Comment) (left foot closed incision)                       Personal Care Assistance Level of Assistance  Bathing, Feeding, Dressing, Total care Bathing Assistance:  Limited assistance Feeding assistance: Limited assistance Dressing Assistance: Maximum assistance Total Care Assistance: Maximum assistance   Functional Limitations Info  Sight, Hearing, Speech Sight Info: Adequate Hearing Info: Adequate Speech Info: Adequate    SPECIAL CARE FACTORS FREQUENCY  PT (By licensed PT), OT (By licensed OT)     PT Frequency: min 4x weekly OT Frequency: min 4x weekly            Contractures Contractures Info: Not present    Additional Factors Info  Code Status, Allergies Code Status Info: full Allergies Info: codeine           Current Medications (11/26/2020):  This is the current hospital active medication list Current Facility-Administered Medications  Medication Dose Route Frequency Provider Last Rate Last Admin   0.9 %  sodium chloride infusion (Manually program via Guardrails IV Fluids)   Intravenous Once Schnier, Dolores Lory, MD       0.9 %  sodium chloride infusion   Intravenous PRN Schnier, Dolores Lory, MD   Stopped at 11/23/20 2305   0.9 %  sodium chloride infusion  250 mL Intravenous PRN Schnier, Dolores Lory, MD 10 mL/hr at 11/26/20 0942 250 mL at 11/26/20 0942   0.9 %  sodium chloride infusion  250 mL Intravenous PRN Katha Cabal, MD 10 mL/hr at 11/25/20 1208 250 mL at 11/25/20 1208   acetaminophen (TYLENOL) suppository 650 mg  650 mg Rectal  Q6H PRN Schnier, Dolores Lory, MD       acetaminophen (TYLENOL) tablet 650 mg  650 mg Per Tube Q6H PRN Schnier, Dolores Lory, MD       albuterol (PROVENTIL) (2.5 MG/3ML) 0.083% nebulizer solution 2.5 mg  2.5 mg Nebulization Q4H PRN Schnier, Dolores Lory, MD   2.5 mg at 11/25/20 2043   [START ON 11/28/2020] allopurinol (ZYLOPRIM) tablet 50 mg  50 mg Oral QODAY Beers, Brandon D, RPH       Ampicillin-Sulbactam (UNASYN) 3 g in sodium chloride 0.9 % 100 mL IVPB  3 g Intravenous Q12H Schnier, Dolores Lory, MD 200 mL/hr at 11/26/20 0946 3 g at 11/26/20 0946   apixaban (ELIQUIS) tablet 5 mg  5 mg Oral BID Lorna Dibble, RPH   5 mg at 11/26/20 1324   ascorbic acid (VITAMIN C) tablet 500 mg  500 mg Oral BID Enzo Bi, MD   500 mg at 11/26/20 0920   atorvastatin (LIPITOR) tablet 40 mg  40 mg Oral Daily Theora Gianotti, NP   40 mg at 11/26/20 0920   budesonide (PULMICORT) nebulizer solution 0.5 mg  0.5 mg Nebulization BID Katha Cabal, MD   0.5 mg at 11/26/20 4010   diltiazem (CARDIZEM CD) 24 hr capsule 240 mg  240 mg Oral Daily Kathlyn Sacramento A, MD   240 mg at 11/26/20 0920   diltiazem (CARDIZEM) injection 10 mg  10 mg Intravenous Once Enzo Bi, MD       diltiazem (CARDIZEM) injection 5 mg  5 mg Intravenous Q2H PRN Schnier, Dolores Lory, MD   5 mg at 11/22/20 1745   docusate (COLACE) 50 MG/5ML liquid 100 mg  100 mg Oral BID Enzo Bi, MD   100 mg at 11/26/20 0919   ferrous gluconate (FERGON) tablet 324 mg  324 mg Oral Q breakfast Katha Cabal, MD   324 mg at 11/26/20 2725   hydrALAZINE (APRESOLINE) injection 5 mg  5 mg Intravenous Q6H PRN Schnier, Dolores Lory, MD   5 mg at 11/22/20 1350   hydrALAZINE (APRESOLINE) tablet 50 mg  50 mg Oral Q8H Enzo Bi, MD   50 mg at 11/26/20 1321   levothyroxine (SYNTHROID) tablet 100 mcg  100 mcg Oral Q0600 Enzo Bi, MD   100 mcg at 11/26/20 0550   meclizine (ANTIVERT) tablet 12.5 mg  12.5 mg Per Tube TID PRN Schnier, Dolores Lory, MD       MEDLINE mouth rinse  15 mL Mouth Rinse BID Schnier, Dolores Lory, MD   15 mL at 11/25/20 2256   metoprolol tartrate (LOPRESSOR) tablet 50 mg  50 mg Oral BID Enzo Bi, MD   50 mg at 11/26/20 0920   multivitamin with minerals tablet 1 tablet  1 tablet Oral Daily Schnier, Dolores Lory, MD   1 tablet at 11/26/20 3664   nutrition supplement (JUVEN) (JUVEN) powder packet 1 packet  1 packet Oral BID BM Enzo Bi, MD   1 packet at 11/26/20 1316   ondansetron (ZOFRAN) tablet 4 mg  4 mg Per Tube Q6H PRN Schnier, Dolores Lory, MD       Or   ondansetron [facility]) injection 4 mg  4 mg Intravenous Q6H PRN Schnier, Dolores Lory, MD        ondansetron [facility]) injection 4 mg  4 mg Intravenous Q6H PRN Schnier, Dolores Lory, MD       pantoprazole (PROTONIX) injection 40 mg  40 mg Intravenous Daily Schnier, Dolores Lory, MD  40 mg at 11/26/20 0917   polyethylene glycol (MIRALAX / GLYCOLAX) packet 17 g  17 g Oral Daily Enzo Bi, MD   17 g at 11/26/20 0916   protein supplement (ENSURE MAX) liquid  11 oz Oral QHS Enzo Bi, MD       sodium chloride flush (NS) 0.9 % injection 3 mL  3 mL Intravenous Q12H Schnier, Dolores Lory, MD   3 mL at 11/26/20 0924   sodium chloride flush (NS) 0.9 % injection 3 mL  3 mL Intravenous PRN Schnier, Dolores Lory, MD       sodium chloride flush (NS) 0.9 % injection 3 mL  3 mL Intravenous Q12H Schnier, Dolores Lory, MD   3 mL at 11/26/20 0925   sodium chloride flush (NS) 0.9 % injection 3 mL  3 mL Intravenous PRN Schnier, Dolores Lory, MD       venlafaxine [facility]) tablet 50 mg  50 mg Oral BID Lorna Dibble, [facility]         Discharge Medications: Please see discharge summary for a list of discharge medications.  Relevant Imaging Results:  Relevant Lab Results:   Additional Information SSN: [id number]  Alberteen Sam, LCSW

## 2020-11-26 NOTE — Progress Notes (Signed)
[facility]  PROGRESS NOTE   Subjective:   Creatinine 3.88 (3.69) (3.62) UOP 2164mL.   PRBC transfusion yesterday.    Objective:  Vital signs in last 24 hours:  Temp:  [97.6 F (36.4 C)-99.5 F (37.5 C)] 98.5 F (36.9 C) (11/15 1127) Pulse Rate:  [50-115] 64 (11/15 1127) Resp:  [14-20] 18 (11/15 1127) BP: (99-177)/(53-80) 149/79 (11/15 1127) SpO2:  [97 %-100 %] 97 % (11/15 1127) Weight:  [94 kg] 94 kg (11/15 0600)  Weight change: -2 kg Filed Weights   11/25/20 0024 11/25/20 0242 11/26/20 0600  Weight: 96 kg 96 kg 94 kg    Intake/Output: I/O last 3 completed shifts: In: 2433 [P.O.:1560; I.V.:30; Blood:378; IV Piggyback:465] Out: 2600 [Urine:2600]   Intake/Output this shift:  Total I/O In: 480 [P.O.:480] Out: 300 [Urine:300]  Physical Exam: General:  No acute distress  Head:  Normocephalic, atraumatic. Moist oral mucosal membranes  Eyes:  Anicteric  Neck:  Supple  Lungs:   Clear to auscultation, normal effort  Heart:  regular  Abdomen:   Soft, nontender, bowel sounds present  Extremities:  Left lower extremity peripheral edema. +wound vac to left foot.   Neurologic:  Awake, alert, following commands  Skin:  No lesions       Basic Metabolic Panel: Recent Labs  Lab 11/20/20 0504 11/21/20 0510 11/22/20 0709 11/23/20 0333 11/24/20 0500 11/25/20 0430 11/26/20 0057  NA 142   < > 141 142 140 142 141  K 5.0   < > 5.1 4.8 4.6 4.6 4.2  CL 107   < > 107 105 105 109 105  CO2 26   < > 25 26 23 25 25   GLUCOSE 132*   < > 124* 133* 113* 135* 103*  BUN 81*   < > 65* 72* 66* 65* 63*  CREATININE 3.32*   < > 3.54* 3.56* 3.62* 3.69* 3.88*  CALCIUM 9.0   < > 8.8* 8.8* 8.7* 8.5* 8.5*  MG 2.1   < > 2.0 1.8 2.0 2.0 1.9  PHOS 4.0  --   --   --   --   --   --    < > = values in this interval not displayed.     CBC: Recent Labs  Lab 11/20/20 0504 11/21/20 0510 11/22/20 0709 11/23/20 0333 11/24/20 0500 11/25/20 0430 11/26/20 0057  WBC 10.7*   < >  11.9* 9.2 9.1 7.3 8.8  NEUTROABS 7.9*  --   --   --   --   --   --   HGB 8.4*   < > 8.5* 8.4* 7.9* 7.3* 8.8*  HCT 26.5*   < > 27.6* 27.3* 25.0* 23.2* 27.6*  MCV 93.3   < > 96.2 95.8 94.3 95.5 95.2  PLT 108*   < > 135* 123* 99* 82* 85*   < > = values in this interval not displayed.      Urinalysis: No results for input(s): COLORURINE, LABSPEC, PHURINE, GLUCOSEU, HGBUR, BILIRUBINUR, KETONESUR, PROTEINUR, UROBILINOGEN, NITRITE, LEUKOCYTESUR in the last 72 hours.  Invalid input(s): APPERANCEUR    Imaging: No results found.   Medications:    sodium chloride Stopped (11/23/20 2305)   sodium chloride 250 mL (11/26/20 0942)   sodium chloride 250 mL (11/25/20 1208)   ampicillin-sulbactam (UNASYN) IV 3 g (11/26/20 0946)    sodium chloride   Intravenous Once   [START ON 11/28/2020] allopurinol  50 mg Oral QODAY   apixaban  5 mg Oral BID   vitamin  C  500 mg Oral BID   atorvastatin  40 mg Oral Daily   budesonide (PULMICORT) nebulizer solution  0.5 mg Nebulization BID   diltiazem  240 mg Oral Daily   diltiazem  10 mg Intravenous Once   docusate  100 mg Oral BID   ferrous gluconate  324 mg Oral Q breakfast   hydrALAZINE  50 mg Oral Q8H   levothyroxine  100 mcg Oral Q0600   mouth rinse  15 mL Mouth Rinse BID   metoprolol tartrate  50 mg Oral BID   multivitamin with minerals  1 tablet Oral Daily   nutrition supplement (JUVEN)  1 packet Oral BID BM   pantoprazole (PROTONIX) IV  40 mg Intravenous Daily   polyethylene glycol  17 g Oral Daily   Ensure Max Protein  11 oz Oral QHS   sodium chloride flush  3 mL Intravenous Q12H   sodium chloride flush  3 mL Intravenous Q12H   venlafaxine  50 mg Oral BID    Assessment/ Plan:     Principal Problem:   Sepsis ([place]) Active Problems:   Atrial fibrillation with RVR (HCC)   Osteomyelitis of foot, left, acute (HCC)   CKD stage 4 due to type 2 diabetes mellitus (HCC)   Liver cirrhosis secondary to NASH (nonalcoholic steatohepatitis) (HCC)    Hypertension   History of anemia due to CKD   Frequent falls   AKI (acute kidney injury) ([place])   Cellulitis and abscess of foot, except toes   Infectious tenosynovitis   Wheeze   Atrial fibrillation with rapid ventricular response (HCC)   Bilateral carotid artery stenosis  Ms. Autumn [name] is a 70 y.o. white female with hypertension, diabetes mellitus type II, peripheral vascular disease, sleep apnea, atrial fibrillation who is admitted to [facility] on 11/09/2020 for Atrial fibrillation with rapid ventricular response ([place]) [I48.91] AKI (acute kidney injury) ([place]) [N17.9] Osteomyelitis of foot, left, acute (HCC) [B52.481] Atrial fibrillation with RVR ([place]) [I48.91] Worsening renal function [N28.9] Syncope, unspecified syncope type [R55]  Hospital course complicated by acute kidney injury and requiring amputation on 10/29 by Dr. Sherryle Lis. Patient with atrial fibrillation and acute CVA this admission.   #1: Acute kidney injury on chronic kidney disease stage IV with proteinuria: baseline creatinine of 3.19, GFR of 15 on 09/27/2020. Chronic kidney disease secondary to diabetes.  Acute kidney injury secondary to ATN - Holding lisinopril and bumetanide. No indication to restart.  - No indication for dialysis at this time.    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 Now with wound vac and IV unasyn.    #3: Anemia with chronic kidney disease: status post PRBC transfusions. Hemoglobin 8.8, status post PRBC transfusion yesterday.    #4: Diabetes mellitus type II with chronic kidney disease: insulin dependent. Hemoglobin A1c of 6.3%.  - Continue glucose control  #5: Hypertension: 149/79. Home regimen also includes clonidine, lisinopril and bumetanide.  - Continue diltiazem, hydralazine, and metoprolol   #6: Gout:  - allopurinol appropriately dosed    LOS: [place], MD [facility] 11/15/202212:46 PM

## 2020-11-26 NOTE — Progress Notes (Addendum)
This afternoon the patient had a bowel movement. At 1450. While PT was in the room, they noticed blood in her stool. I informed the physician to come and take a look.   Physician will put in a gastroenterologist consult. Also wants to hold Eliquis.  Physician mentioned that patient had received a unit of blood the day before due to concerns about her Hgb. Hgb had normalized around 9.   Physician to follow up with consults needed.  Abdomen assessed. No tenderness, no guarding, no pain. Patient is stable and asymptomatic. NT took vital signs. Stable.  Peri Care and Full linen and gown change performed. PT was in the room and assisted me with the patient.

## 2020-11-27 ENCOUNTER — Inpatient Hospital Stay: Payer: Medicare (Managed Care) | Admitting: Certified Registered"

## 2020-11-27 ENCOUNTER — Encounter: Payer: Self-pay | Admitting: Internal Medicine

## 2020-11-27 ENCOUNTER — Encounter
Admission: EM | Disposition: A | Discharge status: Skilled Nursing Facility | Payer: Self-pay | Source: Home / Self Care | Attending: Internal Medicine

## 2020-11-27 DIAGNOSIS — I4891 Unspecified atrial fibrillation: Secondary | ICD-10-CM | POA: Diagnosis not present

## 2020-11-27 DIAGNOSIS — E1122 Type 2 diabetes mellitus with diabetic chronic kidney disease: Secondary | ICD-10-CM | POA: Diagnosis not present

## 2020-11-27 DIAGNOSIS — N189 Chronic kidney disease, unspecified: Secondary | ICD-10-CM | POA: Diagnosis not present

## 2020-11-27 DIAGNOSIS — M86172 Other acute osteomyelitis, left ankle and foot: Secondary | ICD-10-CM | POA: Diagnosis not present

## 2020-11-27 HISTORY — PX: ESOPHAGOGASTRODUODENOSCOPY (EGD) WITH PROPOFOL: SHX5813

## 2020-11-27 LAB — BASIC METABOLIC PANEL
Anion gap: 8 (ref 5–15)
BUN: 55 mg/dL — ABNORMAL HIGH (ref 8–23)
CO2: 25 mmol/L (ref 22–32)
Calcium: 8.5 mg/dL — ABNORMAL LOW (ref 8.9–10.3)
Chloride: 107 mmol/L (ref 98–111)
Creatinine, Ser: 3.66 mg/dL — ABNORMAL HIGH (ref 0.44–1.00)
GFR, Estimated: 13 mL/min — ABNORMAL LOW (ref >60)
Glucose, Bld: 90 mg/dL (ref 70–99)
Potassium: 4.1 mmol/L (ref 3.5–5.1)
Sodium: 140 mmol/L (ref 135–145)

## 2020-11-27 LAB — KOH PREP: Special Requests: NORMAL

## 2020-11-27 LAB — CBC
HCT: 26.8 % — ABNORMAL LOW (ref 36.0–46.0)
Hemoglobin: 8.2 g/dL — ABNORMAL LOW (ref 12.0–15.0)
MCH: 29.4 pg (ref 26.0–34.0)
MCHC: 30.6 g/dL (ref 30.0–36.0)
MCV: 96.1 fL (ref 80.0–100.0)
Platelets: 79 10*3/uL — ABNORMAL LOW (ref 150–400)
RBC: 2.79 MIL/uL — ABNORMAL LOW (ref 3.87–5.11)
RDW: 18.4 % — ABNORMAL HIGH (ref 11.5–15.5)
WBC: 8.4 10*3/uL (ref 4.0–10.5)
nRBC: 0 % (ref 0.0–0.2)

## 2020-11-27 LAB — MAGNESIUM: Magnesium: 1.8 mg/dL (ref 1.7–2.4)

## 2020-11-27 LAB — GLUCOSE, CAPILLARY: Glucose-Capillary: 98 mg/dL (ref 70–99)

## 2020-11-27 SURGERY — ESOPHAGOGASTRODUODENOSCOPY (EGD) WITH PROPOFOL
Anesthesia: General

## 2020-11-27 MED ORDER — PROPOFOL 10 MG/ML IV BOLUS
INTRAVENOUS | Status: AC
  Filled 2020-11-27: qty 40

## 2020-11-27 MED ORDER — PROPOFOL 500 MG/50ML IV EMUL
INTRAVENOUS | Status: DC | PRN
  Administered 2020-11-27: 150 ug/kg/min via INTRAVENOUS

## 2020-11-27 MED ORDER — PROPOFOL 10 MG/ML IV BOLUS
INTRAVENOUS | Status: DC | PRN
  Administered 2020-11-27: 70 mg via INTRAVENOUS

## 2020-11-27 MED ORDER — PEG 3350-KCL-NA BICARB-NACL 420 G PO SOLR
4000.0000 mL | Freq: Once | ORAL | Status: AC
  Administered 2020-11-27: 4000 mL via ORAL
  Filled 2020-11-27 (×2): qty 4000

## 2020-11-27 MED ORDER — SODIUM CHLORIDE 0.9 % IV SOLN: INTRAVENOUS | Status: DC

## 2020-11-27 NOTE — Transfer of Care (Signed)
Immediate Anesthesia Transfer of Care Note  Patient: Autumn [name]  Procedure(s) Performed: ESOPHAGOGASTRODUODENOSCOPY (EGD) WITH PROPOFOL  Patient Location: PACU and Endoscopy Unit  Anesthesia Type:General  Level of Consciousness: awake and drowsy  Airway & Oxygen Therapy: Patient Spontanous Breathing and Patient connected to nasal cannula oxygen  Post-op Assessment: Report given to RN  Post vital signs: reviewed, stable  Last Vitals:  Vitals Value Taken Time  BP    Temp    Pulse    Resp    SpO2      Last Pain:  Vitals:   11/27/20 1047  TempSrc: Temporal  PainSc: 0-No pain         Complications: No notable events documented.

## 2020-11-27 NOTE — Progress Notes (Signed)
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  She'd been following with a [facility] podiatrist outpatient and had recently refused surgery.  She'd recently been treated with antibiotics at [facility] for the diabetic foot infection with enterobacter aerogans culture positive.  She was admitted on 10/29 due to falls at home with worsening L foot chronic diabetic ulcer with osteomyelitis of the 2nd and 3rd metatarsals and cellulitis noted on MRI.  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  Her post operative course was complicated by unresponsiveness and neurologic changes, she was found to have a stroke and was intubated for airway protection.  She was extubated 11/3 and transferred to [facility] service on 11/4.  Hospital course further complicated by acute on chronic anemia with melena after starting on anticoagulation, requiring blood transfusions and GI evaluation.    Assessment & Plan   Principal Problem:   Sepsis ([place]) Active Problems:   Atrial fibrillation with RVR (HCC)   Osteomyelitis of foot, left, acute (HCC)   CKD stage 4 due to type 2 diabetes mellitus (HCC)   Liver cirrhosis secondary to NASH (nonalcoholic steatohepatitis) (HCC)   Hypertension   History of anemia due to CKD   Frequent falls   AKI (acute kidney injury) ([place])   Cellulitis and abscess of foot, except toes   Infectious tenosynovitis   Wheeze   Atrial fibrillation with rapid ventricular response (HCC)   Bilateral carotid artery stenosis   Acute Blood Loss Anemia Iron deficiency Anemia of CKD In setting of heparin and recent surgical procedure - postop bleeding from surgical wound Received total 4 units  pRBC's, most recently on 11/14. Hgb stable in 8's-9's --> 11/16:  8.2 down from 9.3. Labs c/w iron def, vit B12 elevated, folate wnl.   --Monitor Hgb, transfuse to keep Hgb >7 --Continue iron suppl --Hold Eliquis --GI consult as below --Colonoscopy planned for tomorrow if prep completed --May require NGT for prep administration if dysphagia is an issue, pt aware and agreeable   GI bleed - with melena with blood clots seen 11/15.   Pt had been started on Eliquis for 2ndary stroke prevention due to embolic stroke this admission. EGD today (11/16) -  --GI consulted --Hold Eliquis --Increase IV PPI to BID   Stroke Expressive aphasia, improved Neurology suspects periprocedural vs related to atrial fibrillation MRI brian with acute to early subacute infarction in the L posterior frontal cortical and subcortical brain, possibly affecting the precentral gyrus.   Echo with EF 55-60%, dilated LA, patent foramen ovale Carotid US right ICA artery worrisome for a subtotal occlusion.  Moderate to large amount of L sided atheroscelotic plaque, however, Left carotid angiogram with Dr. Delana Meyer showed no significant stenosis. Cleared to resume Eliquis on 11/11 for 2ndary stroke prevention. --Eliquis now on hold due to GI bleed --Continue statin   Acute Metabolic Encephalopathy - Related to acute infection and above --Delirium precautions --Follow and w/u further as indicated   Septic Shock Diabetic Foot Infection Left Foot Osteomyelitis and Cellulitis  Septic Arthritis MRI foot with septic arthritis of second MTP joint and osteomyelitis of 2nd metatarsal head and  second proximal phalanx, severe soft tissue swelling around second metatarsal neck concerning for phlegmon developing abscess measuring 2.1x1 cm circumferentially around distal shaft.  Mild early osteomyelitis of 3rd metatarsal head.  Severe soft tissue edema of the forefoot c/w cellulitis. MRI ankle limited -> interval resection of 2nd  metatarsal, longitudinal tear of peroneus brevis with peroneus tenosynovitis, distal tibialis posterior tenosynovitis and tendinopathy, extensor digitorum tenosynovitis.  Extensive subcutaneous edema circumferentially in distal calf.  Dorsal subcutaneous edema in foot. S/p 2nd ray amputation, I&D, and bone bx of 3rd metatarsal on 11/1 --ID consulted --Continue Unasyn x 4 weeks until 12/09/20 - followed by PO augmentin until 12/23/20 --NWB LLE --Continue wound VAC --ID followup as outpatient   Acute Hypoxic Respiratory Failure Aspiration Pneumonia, treated Pulm edema 2/2 fluid overload CXR 11/3 with right lung opacity concerning for superimposed infection - also with findings concerning for pulmonary edema and small right effusion Aspiration PNA considered, treated with IV Unasyn (for foot wound) Rapid response called early morning 11/10 for respiratory distress.   CXR showed pulm edema (pt had received MIVF for 4 days before I d/c'ed it yesterday).   Started on BiPAP, and given IV lasix 40 x1, 80 x1, metolazone x1 Currently stable on 2L, BiPAP at night --Continue supplemental O2 to keep sats >=90%, wean as tolerated --BiPAP nightly only for likely OSA --diuretic per nephrology given CKD4   Atrial Fibrillation with RVR - developed while meds were held for NPO status and procedures Cardiology increased rate control agents, now HR controlled.   Lopressor has been held half of the time due to low HR, discontinued. --Continue Cardizem 240 mg daily --d/c Lopressor  --hold Eliquis due to GI bleed   Hypertension - BP's now elevated. 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 --monitor CBC   Dysphagia - 2/2 stroke --SLP following, seen on 11/12 --on Dysphagia 3 diet, thin liquid   AKI on CKD IV -  Baseline creatinine around 3.2.   CKD secondary to diabetes.  AKI secondary to ATN. --Nephrology following --hold home lisinopril and Bumex   Elevated Troponin - due to demand ischemia.  --Cardiology following   Hx NASH cirrhosis - compensated at this time, monitor.   Hypothyroidism - Synthroid   Type 2 diabetes -controlled, last A1c 6.3%. Glucose has been inpatient goal or lower --no need for CBGs and SSI --Monitor fasting glucose with BMP  Obesity: Body mass index is 33.45 kg/m.  Complicates overall care and prognosis.  Recommend lifestyle modifications including physical activity and diet for weight loss and overall long-term health.   DVT prophylaxis: Place and maintain sequential compression device Start: 11/15/20 1527   Diet:  Diet Orders (From admission, onward)     Start     Ordered   11/27/20 1336  DIET DYS 3 Room service appropriate? Yes; Fluid consistency: Thin  Diet effective now       Question Answer Comment  Room service appropriate? Yes   Fluid consistency: Thin      11/27/20 1335              Code Status: Full Code   Subjective 11/27/20    Patient seen after EGD today.  She denies abdominal pain or nausea, chest pain shortness of breath, fevers chills or other complaints.  Says her legs are having  cramping and asks to be pulled up in the bed.  Says she has been swallowing okay, but does agree to placement of NG tube if needed for bowel prep.   Disposition Plan & Communication   Status is: Inpatient  Remains inpatient appropriate because: Severity of illness with acute on chronic anemia and ongoing GI evaluation    Consults, Procedures, Significant Events   Consultants:  Gastroenterology Nephrology Infectious disease Cardiology Podiatry Palliative care Neurology  Procedures:  Left foot second ray amputation 11/1 EGD 11/16  Antimicrobials:  Anti-infectives (From admission, onward)    Start     Dose/Rate Route Frequency Ordered  Stop   11/23/20 0000  ceFAZolin (ANCEF) IVPB 1 g/50 mL premix       Note to Pharmacy: To be given in specials   1 g 100 mL/hr over 30 Minutes Intravenous  Once 11/22/20 1106 11/22/20 1750   11/14/20 2200  Ampicillin-Sulbactam (UNASYN) 3 g in sodium chloride 0.9 % 100 mL IVPB        3 g 200 mL/hr over 30 Minutes Intravenous Every 12 hours 11/14/20 1610     11/14/20 0900  piperacillin-tazobactam (ZOSYN) IVPB 2.25 g  Status:  Discontinued        2.25 g 100 mL/hr over 30 Minutes Intravenous Every 8 hours 11/14/20 0805 11/14/20 1609   11/13/20 2200  piperacillin-tazobactam (ZOSYN) IVPB 3.375 g  Status:  Discontinued        3.375 g 12.5 mL/hr over 240 Minutes Intravenous Every 12 hours 11/13/20 0758 11/14/20 0805   11/13/20 0600  piperacillin-tazobactam (ZOSYN) IVPB 2.25 g        2.25 g 100 mL/hr over 30 Minutes Intravenous Every 8 hours 11/12/20 2155 11/13/20 1725   11/11/20 1600  vancomycin (VANCOCIN) IVPB 1000 mg/200 mL premix  Status:  Discontinued        1,000 mg 200 mL/hr over 60 Minutes Intravenous Every 48 hours 11/09/20 1618 11/10/20 1045   11/11/20 1558  vancomycin (VANCOCIN) powder  Status:  Discontinued          As needed 11/11/20 1558 11/11/20 1558   11/10/20 2000  vancomycin (VANCOCIN) IVPB 1000 mg/200 mL premix  Status:  Discontinued        1,000 mg 200 mL/hr over 60 Minutes Intravenous Every 48 hours 11/10/20 1918 11/12/20 0940   11/09/20 1800  ceFEPIme (MAXIPIME) 2 g in sodium chloride 0.9 % 100 mL IVPB  Status:  Discontinued        2 g 200 mL/hr over 30 Minutes Intravenous Every 24 hours 11/09/20 1619 11/12/20 2146   11/09/20 1619  vancomycin variable dose per unstable renal function (pharmacist dosing)  Status:  Discontinued         Does not apply See admin instructions 11/09/20 1619 11/12/20 1521   11/09/20 1615  vancomycin (VANCOREADY) IVPB 1750 mg/350 mL        1,750 mg 175 mL/hr over 120 Minutes Intravenous  Once 11/09/20 1606 11/09/20 1947   11/09/20 1600   metroNIDAZOLE (FLAGYL) IVPB 500 mg  Status:  Discontinued        500 mg 100 mL/hr over 60 Minutes Intravenous Every 8 hours 11/09/20 1555 11/12/20 2146         Micro    Objective   Vitals:   11/27/20 1144 11/27/20 1154 11/27/20 1204 11/27/20 1214  BP: (!) 144/66 (!) 159/64 (!) 172/66 (!) 177/76  Pulse: 62 62 62 64  Resp: 19 19 18 19   Temp:  TempSrc: Temporal     SpO2: 97% 96% 97% 98%  Weight:      Height:        Intake/Output Summary (Last 24 hours) at 11/27/2020 1348 Last data filed at 11/27/2020 1139 Gross per 24 hour  Intake 580 ml  Output 600 ml  Net -20 ml   Filed Weights   11/25/20 0242 11/26/20 0600 11/27/20 0600  Weight: 96 kg 94 kg 94 kg    Physical Exam:  General exam: awake, alert, no acute distress, obese HEENT: atraumatic, clear conjunctiva, anicteric sclera, moist mucus membranes, hearing grossly normal  Respiratory system: CTAB, no wheezes, rales or rhonchi, normal respiratory effort. Cardiovascular system: normal S1/S2, RRR, improved lower extremity edema as evidenced by significant skin wrinkling.   Gastrointestinal system: soft, nontender nondistended, bowel sounds present. Central nervous system: A&O x3. no gross focal neurologic deficits, dysarthric speech Extremities: Left foot wound VAC in place Psychiatry: normal mood, congruent affect, judgement and insight appear normal  Labs   Data Reviewed: I have personally reviewed following labs and imaging studies  CBC: Recent Labs  Lab 11/24/20 0500 11/25/20 0430 11/26/20 0057 11/26/20 1524 11/27/20 0501  WBC 9.1 7.3 8.8 12.4* 8.4  HGB 7.9* 7.3* 8.8* 9.3* 8.2*  HCT 25.0* 23.2* 27.6* 29.2* 26.8*  MCV 94.3 95.5 95.2 94.5 96.1  PLT 99* 82* 85* 96* 79*   Basic Metabolic Panel: Recent Labs  Lab 11/23/20 0333 11/24/20 0500 11/25/20 0430 11/26/20 0057 11/27/20 0501  NA 142 140 142 141 140  K 4.8 4.6 4.6 4.2 4.1  CL 105 105 109 105 107  CO2 26 23 25 25 25   GLUCOSE 133* 113* 135*  103* 90  BUN 72* 66* 65* 63* 55*  CREATININE 3.56* 3.62* 3.69* 3.88* 3.66*  CALCIUM 8.8* 8.7* 8.5* 8.5* 8.5*  MG 1.8 2.0 2.0 1.9 1.8   GFR: Estimated Creatinine Clearance: 17 mL/min (A) (by C-G formula based on SCr of 3.66 mg/dL (H)). Liver Function Tests: No results for input(s): AST, ALT, ALKPHOS, BILITOT, PROT, ALBUMIN in the last 168 hours. No results for input(s): LIPASE, AMYLASE in the last 168 hours. No results for input(s): AMMONIA in the last 168 hours. Coagulation Profile: Recent Labs  Lab 11/22/20 0709  INR 1.3*   Cardiac Enzymes: No results for input(s): CKTOTAL, CKMB, CKMBINDEX, TROPONINI in the last 168 hours. BNP (last 3 results) No results for input(s): PROBNP in the last 8760 hours. HbA1C: No results for input(s): HGBA1C in the last 72 hours. CBG: Recent Labs  Lab 11/23/20 1125 11/23/20 1618 11/24/20 0825 11/24/20 1304 11/27/20 1054  GLUCAP 157* 120* 107* 132* 98   Lipid Profile: No results for input(s): CHOL, HDL, LDLCALC, TRIG, CHOLHDL, LDLDIRECT in the last 72 hours. Thyroid Function Tests: No results for input(s): TSH, T4TOTAL, FREET4, T3FREE, THYROIDAB in the last 72 hours. Anemia Panel: No results for input(s): VITAMINB12, FOLATE, FERRITIN, TIBC, IRON, RETICCTPCT in the last 72 hours. Sepsis Labs: Recent Labs  Lab 11/21/20 0510  PROCALCITON 0.10    Recent Results (from the past 240 hour(s))  KOH prep     Status: None   Collection Time: 11/27/20 11:40 AM   Specimen: Esophagus  Result Value Ref Range Status   Specimen Description ESOPHAGUS  Final   Special Requests Normal  Final   KOH Prep   Final    YEAST PRESENT Performed at [facility], [address] [place], [place] [address]    Report Status 11/27/2020 FINAL  Final  Imaging Studies   No results found.   Medications   Scheduled Meds:  sodium chloride   Intravenous Once   [START ON 11/28/2020] allopurinol  50 mg Oral QODAY   vitamin C  500 mg Oral BID    atorvastatin  40 mg Oral Daily   budesonide (PULMICORT) nebulizer solution  0.5 mg Nebulization BID   diltiazem  240 mg Oral Daily   diltiazem  10 mg Intravenous Once   docusate  100 mg Oral BID   ferrous gluconate  324 mg Oral Q breakfast   hydrALAZINE  50 mg Oral Q8H   levothyroxine  100 mcg Oral Q0600   mouth rinse  15 mL Mouth Rinse BID   metoprolol tartrate  50 mg Oral BID   multivitamin with minerals  1 tablet Oral Daily   nutrition supplement (JUVEN)  1 packet Oral BID BM   pantoprazole (PROTONIX) IV  40 mg Intravenous Q12H   polyethylene glycol  17 g Oral Daily   polyethylene glycol-electrolytes  4,000 mL Oral Once   Ensure Max Protein  11 oz Oral QHS   sodium chloride flush  3 mL Intravenous Q12H   sodium chloride flush  3 mL Intravenous Q12H   venlafaxine  50 mg Oral BID   Continuous Infusions:  sodium chloride 10 mL/hr at 11/27/20 1022   sodium chloride 250 mL (11/26/20 0942)   sodium chloride 250 mL (11/25/20 1208)   ampicillin-sulbactam (UNASYN) IV 3 g (11/27/20 1024)       LOS: 18 days    Time spent: 30 minutes    Ezekiel Slocumb, DO [facility]  11/27/2020, 1:48 PM      If 7PM-7AM, please contact night-coverage. How to contact the [facility] Attending or Consulting provider [place] or covering provider during after hours [place], for this patient?    Check the care team in [facility] and look for a) attending/consulting [facility] provider listed and b) the [facility] team listed Log into [email or web address] and use [place]'s universal password to access. If you do not have the password, please contact the hospital operator. Locate the [facility] provider you are looking for under [facility] and page to a number that you can be directly reached. If you still have difficulty reaching the provider, please page the [facility] (Director on Call) for the Hospitalists listed on amion for assistance.

## 2020-11-27 NOTE — Anesthesia Preprocedure Evaluation (Signed)
Anesthesia Evaluation  Patient identified by MRN, date of birth, ID band Patient awake    Reviewed: Allergy & Precautions, NPO status , Patient's Chart, lab work & pertinent test results  Airway Mallampati: III  TM Distance: >3 FB Neck ROM: full    Dental  (+) Edentulous Upper, Edentulous Lower   Pulmonary neg pulmonary ROS, former smoker,    Pulmonary exam normal        Cardiovascular hypertension, negative cardio ROS Normal cardiovascular exam     Neuro/Psych negative neurological ROS  negative psych ROS   GI/Hepatic negative GI ROS, Neg liver ROS,   Endo/Other  negative endocrine ROSdiabetes  Renal/GU negative Renal ROS  negative genitourinary   Musculoskeletal   Abdominal (+) + obese,   Peds  Hematology negative hematology ROS (+)   Anesthesia Other Findings Past Medical History: No date: Diabetes mellitus without complication (HCC) No date: Hypertension No date: Kidney stone No date: Thyroid disease  Past Surgical History: No date: ABDOMINAL HYSTERECTOMY 11/11/2020: AMPUTATION; Left     Comment:  Procedure: AMPUTATION RAY - 2nd metatarsal and 3rd               metatarsal head;  Surgeon: [name], DPM;                Location: [facility] ORS;  Service: Podiatry;  Laterality:               Left; 11/22/2020: CAROTID ANGIOGRAPHY; Left     Comment:  Procedure: CAROTID ANGIOGRAPHY;  Surgeon: Katha Cabal, MD;  Location: [place] [facility] LAB;  Service:              Cardiovascular;  Laterality: Left; No date: URETERAL STENT PLACEMENT  BMI    Body Mass Index: 33.45 kg/m      Reproductive/Obstetrics negative OB ROS                             Anesthesia Physical Anesthesia Plan  ASA: 3  Anesthesia Plan: General   Post-op Pain Management:    Induction: Intravenous  PONV Risk Score and Plan: Propofol infusion and TIVA  Airway Management Planned:  Natural Airway and Nasal Cannula  Additional Equipment:   Intra-op Plan:   Post-operative Plan:   Informed Consent: I have reviewed the patients History and Physical, chart, labs and discussed the procedure including the risks, benefits and alternatives for the proposed anesthesia with the patient or authorized representative who has indicated his/her understanding and acceptance.     Dental Advisory Given  Plan Discussed with: Anesthesiologist, CRNA and Surgeon  Anesthesia Plan Comments: (Patient consented for risks of anesthesia including but not limited to:  - adverse reactions to medications - risk of airway placement if required - damage to eyes, teeth, lips or other oral mucosa - nerve damage due to positioning  - sore throat or hoarseness - Damage to heart, brain, nerves, lungs, other parts of body or loss of life  Patient voiced understanding.)        Anesthesia Quick Evaluation

## 2020-11-27 NOTE — Anesthesia Postprocedure Evaluation (Signed)
Anesthesia Post Note  Patient: ELENOR WILDES  Procedure(s) Performed: ESOPHAGOGASTRODUODENOSCOPY (EGD) WITH PROPOFOL  Patient location during evaluation: Endoscopy Anesthesia Type: General Level of consciousness: awake and alert Pain management: pain level controlled Vital Signs Assessment: post-procedure vital signs reviewed and stable Respiratory status: spontaneous breathing, nonlabored ventilation, respiratory function stable and patient connected to nasal cannula oxygen Cardiovascular status: blood pressure returned to baseline and stable Postop Assessment: no apparent nausea or vomiting Anesthetic complications: no   No notable events documented.   Last Vitals:  Vitals:   11/27/20 1204 11/27/20 1214  BP: (!) 172/66 (!) 177/76  Pulse: 62 64  Resp: 18 19  Temp:    SpO2: 97% 98%    Last Pain:  Vitals:   11/27/20 1214  TempSrc:   PainSc: 0-No pain                 Margaree Mackintosh

## 2020-11-27 NOTE — Anesthesia Procedure Notes (Signed)
Procedure Name: MAC Date/Time: 11/27/2020 11:29 AM Performed by: Biagio Borg, CRNA Pre-anesthesia Checklist: Patient identified, Emergency Drugs available, Suction available, Patient being monitored and Timeout performed Patient Re-evaluated:Patient Re-evaluated prior to induction Oxygen Delivery Method: Nasal cannula Induction Type: IV induction Placement Confirmation: positive ETCO2 and CO2 detector

## 2020-11-27 NOTE — Progress Notes (Signed)
[facility]  PROGRESS NOTE   Subjective:   EGD this morning. Colonoscopy scheduled for tomorrow.    Objective:  Vital signs in last 24 hours:  Temp:  [96.8 F (36 C)-98.5 F (36.9 C)] 97.8 F (36.6 C) (11/16 1549) Pulse Rate:  [52-110] 110 (11/16 1549) Resp:  [14-19] 18 (11/16 1549) BP: (125-177)/(64-93) 125/93 (11/16 1549) SpO2:  [96 %-100 %] 98 % (11/16 1549) Weight:  [94 kg] 94 kg (11/16 0600)  Weight change: 0 kg Filed Weights   11/25/20 0242 11/26/20 0600 11/27/20 0600  Weight: 96 kg 94 kg 94 kg    Intake/Output: I/O last 3 completed shifts: In: 1443 [P.O.:1200; I.V.:30; Blood:378] Out: 1900 [Urine:1900]   Intake/Output this shift:  Total I/O In: 100 [I.V.:100] Out: 0   Physical Exam: General:  No acute distress  Head:  Normocephalic, atraumatic. Moist oral mucosal membranes  Eyes:  Anicteric  Neck:  Supple  Lungs:   Clear to auscultation, normal effort  Heart:  regular  Abdomen:   Soft, nontender, bowel sounds present  Extremities:  Trace Left lower extremity peripheral edema. +wound vac to left foot.   Neurologic:  Awake, alert, following commands  Skin:  No lesions       Basic Metabolic Panel: Recent Labs  Lab 11/23/20 0333 11/24/20 0500 11/25/20 0430 11/26/20 0057 11/27/20 0501  NA 142 140 142 141 140  K 4.8 4.6 4.6 4.2 4.1  CL 105 105 109 105 107  CO2 26 23 25 25 25   GLUCOSE 133* 113* 135* 103* 90  BUN 72* 66* 65* 63* 55*  CREATININE 3.56* 3.62* 3.69* 3.88* 3.66*  CALCIUM 8.8* 8.7* 8.5* 8.5* 8.5*  MG 1.8 2.0 2.0 1.9 1.8     CBC: Recent Labs  Lab 11/24/20 0500 11/25/20 0430 11/26/20 0057 11/26/20 1524 11/27/20 0501  WBC 9.1 7.3 8.8 12.4* 8.4  HGB 7.9* 7.3* 8.8* 9.3* 8.2*  HCT 25.0* 23.2* 27.6* 29.2* 26.8*  MCV 94.3 95.5 95.2 94.5 96.1  PLT 99* 82* 85* 96* 79*      Urinalysis: No results for input(s): COLORURINE, LABSPEC, PHURINE, GLUCOSEU, HGBUR, BILIRUBINUR, KETONESUR, PROTEINUR, UROBILINOGEN, NITRITE,  LEUKOCYTESUR in the last 72 hours.  Invalid input(s): APPERANCEUR    Imaging: No results found.   Medications:    sodium chloride 10 mL/hr at 11/27/20 1022   sodium chloride 250 mL (11/26/20 0942)   sodium chloride 250 mL (11/25/20 1208)   ampicillin-sulbactam (UNASYN) IV 3 g (11/27/20 1024)    sodium chloride   Intravenous Once   [START ON 11/28/2020] allopurinol  50 mg Oral QODAY   vitamin C  500 mg Oral BID   atorvastatin  40 mg Oral Daily   budesonide (PULMICORT) nebulizer solution  0.5 mg Nebulization BID   diltiazem  240 mg Oral Daily   diltiazem  10 mg Intravenous Once   docusate  100 mg Oral BID   ferrous gluconate  324 mg Oral Q breakfast   hydrALAZINE  50 mg Oral Q8H   levothyroxine  100 mcg Oral Q0600   mouth rinse  15 mL Mouth Rinse BID   metoprolol tartrate  50 mg Oral BID   multivitamin with minerals  1 tablet Oral Daily   nutrition supplement (JUVEN)  1 packet Oral BID BM   pantoprazole (PROTONIX) IV  40 mg Intravenous Q12H   polyethylene glycol  17 g Oral Daily   polyethylene glycol-electrolytes  4,000 mL Oral Once   Ensure Max Protein  11 oz Oral QHS  sodium chloride flush  3 mL Intravenous Q12H   sodium chloride flush  3 mL Intravenous Q12H   venlafaxine  50 mg Oral BID    Assessment/ Plan:     Principal Problem:   Sepsis ([place]) Active Problems:   Atrial fibrillation with RVR (HCC)   Osteomyelitis of foot, left, acute (HCC)   CKD stage 4 due to type 2 diabetes mellitus (HCC)   Liver cirrhosis secondary to NASH (nonalcoholic steatohepatitis) (HCC)   Hypertension   History of anemia due to CKD   Frequent falls   AKI (acute kidney injury) ([place])   Cellulitis and abscess of foot, except toes   Infectious tenosynovitis   [place]   Atrial fibrillation with rapid ventricular response (HCC)   Bilateral carotid artery stenosis  Ms. Autumn [name] is a 70 y.o. white female with hypertension, diabetes mellitus type II, peripheral vascular disease,  sleep apnea, atrial fibrillation who is admitted to [facility] on 11/09/2020 for Atrial fibrillation with rapid ventricular response ([place]) [I48.91] AKI (acute kidney injury) ([place]) [N17.9] Osteomyelitis of foot, left, acute (HCC) [M35.361] Atrial fibrillation with RVR ([place]) [I48.91] Worsening renal function [N28.9] Syncope, unspecified syncope type [R55]  Hospital course complicated by acute kidney injury and requiring amputation on 10/29 by Dr. Sherryle Lis. Patient with atrial fibrillation and acute CVA this admission.   #1: Acute kidney injury on chronic kidney disease stage IV with proteinuria: baseline creatinine of 3.19, GFR of 15 on 09/27/2020. Chronic kidney disease secondary to diabetes.  Acute kidney injury secondary to ATN - Holding lisinopril and bumetanide. No indication to restart.  - No indication for dialysis at this time.    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 Now with wound vac and IV unasyn.    #3: Anemia with chronic kidney disease: status post PRBC transfusions. Appreciate GI input.    #4: Diabetes mellitus type II with chronic kidney disease: insulin dependent. Hemoglobin A1c of 6.3%.  - Continue glucose control  #5: Hypertension: 125/93. Home regimen also includes clonidine, lisinopril and bumetanide.  - Continue diltiazem, hydralazine, and metoprolol   #6: Gout:  - allopurinol appropriately dosed    LOS: [place], MD [facility] 11/16/20224:09 PM

## 2020-11-27 NOTE — Progress Notes (Addendum)
Cancellation note: 11/16 10:30AM Room: 254  Pt at test/procedure and not in room. PT to reassess as able.  [employer], SPT

## 2020-11-27 NOTE — Op Note (Signed)
[facility] Gastroenterology Patient Name: Natonya Finstad Procedure Date: 11/27/2020 11:31 AM MRN: [id number] Account #: [id number] Date of Birth: 12-Sep-1950 Admit Type: Outpatient Age: 70 Room: [facility] ENDO ROOM 1 Gender: Female Note Status: Finalized Instrument Name: Altamese Cabal Endoscope [id number] Procedure:             Upper GI endoscopy Indications:           Melena Providers:             Andrey Farmer MD, MD Medicines:             Monitored Anesthesia Care Complications:         No immediate complications. Procedure:             Pre-Anesthesia Assessment:                        - Prior to the procedure, a History and Physical was                         performed, and patient medications and allergies were                         reviewed. The patient is competent. The risks and                         benefits of the procedure and the sedation options and                         risks were discussed with the patient. All questions                         were answered and informed consent was obtained.                         Patient identification and proposed procedure were                         verified by the physician, the nurse, the anesthetist                         and the technician in the endoscopy suite. Mental                         Status Examination: alert and oriented. Airway                         Examination: normal oropharyngeal airway and neck                         mobility. Respiratory Examination: clear to                         auscultation. CV Examination: normal. Prophylactic                         Antibiotics: The patient does not require prophylactic                         antibiotics. Prior Anticoagulants: The patient has  taken Eliquis (apixaban), last dose was 1 day prior to                         procedure. ASA Grade Assessment: IV - A patient with                         severe systemic disease  that is a constant threat to                         life. After reviewing the risks and benefits, the                         patient was deemed in satisfactory condition to                         undergo the procedure. The anesthesia plan was to use                         monitored anesthesia care (MAC). Immediately prior to                         administration of medications, the patient was                         re-assessed for adequacy to receive sedatives. The                         heart rate, respiratory rate, oxygen saturations,                         blood pressure, adequacy of pulmonary ventilation, and                         response to care were monitored throughout the                         procedure. The physical status of the patient was                         re-assessed after the procedure.                        After obtaining informed consent, the endoscope was                         passed under direct vision. Throughout the procedure,                         the patient's blood pressure, pulse, and oxygen                         saturations were monitored continuously. The Endoscope                         was introduced through the mouth, and advanced to the                         second part of duodenum.  The upper GI endoscopy was                         accomplished without difficulty. The patient tolerated                         the procedure well. Findings:      White nummular lesions were noted in the upper third of the esophagus.       Brushings for KOH prep were obtained. Estimated blood loss: none.      The exam of the esophagus was otherwise normal.      There is no endoscopic evidence of varices in the entire esophagus.      Patchy mild inflammation characterized by erythema was found in the       entire examined stomach.      Patchy mildly erythematous mucosa without active bleeding and with no       stigmata of bleeding was found in the  duodenal bulb. Impression:            - White nummular lesions in esophageal mucosa.                         Brushings performed.                        - Gastritis.                        - Erythematous duodenopathy. Recommendation:        - Return patient to hospital ward for ongoing care.                        - Clear liquid diet.                        - Perform a colonoscopy tomorrow.                        - If patient not cleared to take liquids from speech                         then she would need an NG tube in order to get her                         prep. Procedure Code(s):     --- Professional ---                        [phone], Esophagogastroduodenoscopy, flexible,                         transoral; diagnostic, including collection of                         specimen(s) by brushing or washing, when performed                         (separate procedure) Diagnosis Code(s):     --- Professional ---                        K22.8, Other specified diseases of  esophagus                        K29.70, Gastritis, unspecified, without bleeding                        K31.89, Other diseases of stomach and duodenum                        K92.1, Melena (includes Hematochezia) CPT copyright 2019 American Medical Association. All rights reserved. The codes documented in this report are preliminary and upon coder review may  be revised to meet current compliance requirements. Andrey Farmer MD, MD 11/27/2020 11:49:15 AM Number of Addenda: 0 Note Initiated On: 11/27/2020 11:31 AM Estimated Blood Loss:  Estimated blood loss: none.      [facility]

## 2020-11-27 NOTE — Progress Notes (Signed)
PT Cancellation Note  Patient Details Name: Autumn [name] MRN: [id number] DOB: 1950-10-20   Cancelled Treatment:    Reason Eval/Treat Not Completed: Other (comment). PT entered room, CNA just starting patient care and a bath. PT to re-attempt as able.   Lieutenant Diego PT, DPT 3:40 PM,11/27/20

## 2020-11-27 NOTE — Progress Notes (Signed)
Progress Note  Patient Name: Autumn [name] Date of Encounter: 11/27/2020  [facility] Cardiologist: Nelva Bush, MD   Subjective   Melena reported yesterday and Eliquis held. GI saw and plan for EGD. Patient reports she is feeling the same. No chest pain. Tele shows SB, no further pauses noted.  Inpatient Medications    Scheduled Meds:  sodium chloride   Intravenous Once   [START ON 11/28/2020] allopurinol  50 mg Oral QODAY   vitamin C  500 mg Oral BID   atorvastatin  40 mg Oral Daily   budesonide (PULMICORT) nebulizer solution  0.5 mg Nebulization BID   diltiazem  240 mg Oral Daily   diltiazem  10 mg Intravenous Once   docusate  100 mg Oral BID   ferrous gluconate  324 mg Oral Q breakfast   hydrALAZINE  50 mg Oral Q8H   levothyroxine  100 mcg Oral Q0600   mouth rinse  15 mL Mouth Rinse BID   metoprolol tartrate  50 mg Oral BID   multivitamin with minerals  1 tablet Oral Daily   nutrition supplement (JUVEN)  1 packet Oral BID BM   pantoprazole (PROTONIX) IV  40 mg Intravenous Q12H   polyethylene glycol  17 g Oral Daily   Ensure Max Protein  11 oz Oral QHS   sodium chloride flush  3 mL Intravenous Q12H   sodium chloride flush  3 mL Intravenous Q12H   venlafaxine  50 mg Oral BID   Continuous Infusions:  sodium chloride Stopped (11/23/20 2305)   sodium chloride 250 mL (11/26/20 0942)   sodium chloride 250 mL (11/25/20 1208)   ampicillin-sulbactam (UNASYN) IV 3 g (11/26/20 2139)   PRN Meds: sodium chloride, sodium chloride, sodium chloride, [DISCONTINUED] acetaminophen **OR** acetaminophen, acetaminophen, albuterol, diltiazem, hydrALAZINE, meclizine, ondansetron **OR** ondansetron (ZOFRAN) IV, ondansetron (ZOFRAN) IV, sodium chloride flush, sodium chloride flush   Vital Signs    Vitals:   11/27/20 0520 11/27/20 0600 11/27/20 0741 11/27/20 0746  BP: (!) 171/75  (!) 152/77   Pulse: (!) 52  (!) 56   Resp: 14  18   Temp: 98 F (36.7 C)  98.1 F (36.7 C)    TempSrc:      SpO2: 100%  97% 97%  Weight:  94 kg    Height:        Intake/Output Summary (Last 24 hours) at 11/27/2020 0920 Last data filed at 11/27/2020 0740 Gross per 24 hour  Intake 960 ml  Output 900 ml  Net 60 ml   Last 3 Weights 11/27/2020 11/26/2020 11/25/2020  Weight (lbs) 207 lb 3.7 oz 207 lb 3.7 oz 211 lb 10.3 oz  Weight (kg) 94 kg 94 kg 96 kg      Telemetry    SB HR 50s, PVCs - Personally Reviewed  ECG    NO new - Personally Reviewed  Physical Exam   GEN: No acute distress.   Neck: No JVD Cardiac: RRR, no murmurs, rubs, or gallops.  Respiratory: Clear to auscultation bilaterally. GI: Soft, nontender, non-distended  MS: No edema; No deformity. Neuro:  Nonfocal  Psych: Normal affect   Labs    High Sensitivity Troponin:   Recent Labs  Lab 11/09/20 1220 11/09/20 1818 11/10/20 0115 11/10/20 0649 11/21/20 0510  TROPONINIHS 147* 151* 162* 149* 81*     Chemistry Recent Labs  Lab 11/25/20 0430 11/26/20 0057 11/27/20 0501  NA 142 141 140  K 4.6 4.2 4.1  CL 109 105 107  CO2  25 25 25   GLUCOSE 135* 103* 90  BUN 65* 63* 55*  CREATININE 3.69* 3.88* 3.66*  CALCIUM 8.5* 8.5* 8.5*  MG 2.0 1.9 1.8  GFRNONAA 13* 12* 13*  ANIONGAP 8 11 8     Lipids No results for input(s): CHOL, TRIG, HDL, LABVLDL, LDLCALC, CHOLHDL in the last 168 hours.  Hematology Recent Labs  Lab 11/26/20 0057 11/26/20 1524 11/27/20 0501  WBC 8.8 12.4* 8.4  RBC 2.90* 3.09* 2.79*  HGB 8.8* 9.3* 8.2*  HCT 27.6* 29.2* 26.8*  MCV 95.2 94.5 96.1  MCH 30.3 30.1 29.4  MCHC 31.9 31.8 30.6  RDW 18.1* 18.1* 18.4*  PLT 85* 96* 79*   Thyroid No results for input(s): TSH, FREET4 in the last 168 hours.  BNP Recent Labs  Lab 11/21/20 0510  BNP 589.6*    DDimer No results for input(s): DDIMER in the last 168 hours.   Radiology    No results found.  Cardiac Studies   2D Echocardiogram 10.30.2022    1. Left ventricular ejection fraction, by estimation, is 55 to 60%. The   left ventricle has normal function. The left ventricle has no regional  wall motion abnormalities. There is mild concentric left ventricular  hypertrophy. Left ventricular diastolic  parameters are indeterminate. Elevated left ventricular end-diastolic  pressure.   2. Right ventricular systolic function is normal. The right ventricular  size is normal. There is normal pulmonary artery systolic pressure.   3. Left atrial size was severely dilated.   4. The mitral valve is normal in structure. Trivial mitral valve  regurgitation. No evidence of mitral stenosis.   5. The aortic valve is tricuspid. Aortic valve regurgitation is not  visualized. No aortic stenosis is present.   6. The inferior vena cava is normal in size with greater than 50%  respiratory variability, suggesting right atrial pressure of 3 mmHg.   7. There is a small patent foramen ovale.     Patient Profile     70 y.o. female with a hx of HFpEF, CKD stage IV-V, diabetes since her late 60s to early 102s with diabetic neuropathy and diabetic foot ulcer, HTN, anemia of chronic disease, hepatic cirrhosis, sleep apnea, and gout who was admitted October 29th and has had a complex hospital course in the setting of acute stroke, rapid atrial fibrillation, left foot osteomyelitis, blood loss anemia, respiratory failure, volume overload, acute kidney injury, and sepsis.  Assessment & Plan    Afib with RVR - initially difficult to rate control but ultimately converted to SR - she remains in SR/SB - diltiazem decreased to 240mg  daily for post-termination pauses. No further pauses noted on telemetry - Eliquis held for Acute Anemia/suspected GIB. Hgb today 8.2   Acute respiratory failure - Bipap   Demand ischemia/elevated troponin - In the setting of Afib RVR, CKD stage 4, sepsis, osteomyelitis, and stroke - HS trop peaked at 162 - Echo this admission showed EF 55-60% - Lopressor 100mg  BID - continue statin 40mg  daily    Sepsis/osteomyelitis - abx per ID - VVS following   Acute stroke - presumed embolic in the setting of Afib - Eliquis held as above   Stage IV/CKD - Scr 3.19 baseline - today 3.88 - nephrology following   Anemia Suspected GIB - in the setting of heparin and recent surgical procedure - s/p 2units pRBC - Eliquis held - Hgb 8.2 today - GI consulted plan on EGD  For questions or updates, please contact [facility] Please consult [email or web address] for contact  info under        Signed, Misa Fedorko Ninfa Meeker, PA-C  11/27/2020, 9:20 AM

## 2020-11-27 NOTE — Care Plan (Signed)
EGD unremarkable besides possible candida. Given negative EGD, next step would be colonoscopy. Will tentatively add her on for colonoscopy tomorrow but if unable to drink prep due to recent stroke then an NG tube would need to be placed in order for her to get the prep.  Raylene Miyamoto MD, MPH [facility]

## 2020-11-27 NOTE — Progress Notes (Signed)
OT Cancellation Note  Patient Details Name: Autumn [name] MRN: [id number] DOB: [birth year]   Cancelled Treatment:    Reason Eval/Treat Not Completed: Patient at procedure or test/ unavailable. Pt currently off the floor for endoscopy and unavailable for OT tx. OT to re-attempt at later time/date as able.   Fredirick Maudlin, OTR/L [place]

## 2020-11-28 ENCOUNTER — Encounter: Payer: Self-pay | Admitting: Gastroenterology

## 2020-11-28 DIAGNOSIS — E1122 Type 2 diabetes mellitus with diabetic chronic kidney disease: Secondary | ICD-10-CM | POA: Diagnosis not present

## 2020-11-28 DIAGNOSIS — I6523 Occlusion and stenosis of bilateral carotid arteries: Secondary | ICD-10-CM | POA: Diagnosis not present

## 2020-11-28 DIAGNOSIS — J9601 Acute respiratory failure with hypoxia: Secondary | ICD-10-CM | POA: Diagnosis not present

## 2020-11-28 DIAGNOSIS — I4891 Unspecified atrial fibrillation: Secondary | ICD-10-CM | POA: Diagnosis not present

## 2020-11-28 DIAGNOSIS — K7581 Nonalcoholic steatohepatitis (NASH): Secondary | ICD-10-CM | POA: Diagnosis not present

## 2020-11-28 DIAGNOSIS — N179 Acute kidney failure, unspecified: Secondary | ICD-10-CM | POA: Diagnosis not present

## 2020-11-28 DIAGNOSIS — N184 Chronic kidney disease, stage 4 (severe): Secondary | ICD-10-CM | POA: Diagnosis not present

## 2020-11-28 LAB — CBC
HCT: 29.2 % — ABNORMAL LOW (ref 36.0–46.0)
Hemoglobin: 9.1 g/dL — ABNORMAL LOW (ref 12.0–15.0)
MCH: 30.2 pg (ref 26.0–34.0)
MCHC: 31.2 g/dL (ref 30.0–36.0)
MCV: 97 fL (ref 80.0–100.0)
Platelets: 80 10*3/uL — ABNORMAL LOW (ref 150–400)
RBC: 3.01 MIL/uL — ABNORMAL LOW (ref 3.87–5.11)
RDW: 18.5 % — ABNORMAL HIGH (ref 11.5–15.5)
WBC: 7.5 10*3/uL (ref 4.0–10.5)
nRBC: 0 % (ref 0.0–0.2)

## 2020-11-28 LAB — BASIC METABOLIC PANEL
Anion gap: 10 (ref 5–15)
BUN: 48 mg/dL — ABNORMAL HIGH (ref 8–23)
CO2: 22 mmol/L (ref 22–32)
Calcium: 8.2 mg/dL — ABNORMAL LOW (ref 8.9–10.3)
Chloride: 109 mmol/L (ref 98–111)
Creatinine, Ser: 3.53 mg/dL — ABNORMAL HIGH (ref 0.44–1.00)
GFR, Estimated: 14 mL/min — ABNORMAL LOW (ref >60)
Glucose, Bld: 91 mg/dL (ref 70–99)
Potassium: 4.2 mmol/L (ref 3.5–5.1)
Sodium: 141 mmol/L (ref 135–145)

## 2020-11-28 LAB — MAGNESIUM: Magnesium: 1.9 mg/dL (ref 1.7–2.4)

## 2020-11-28 MED ORDER — PEG 3350-KCL-NA BICARB-NACL 420 G PO SOLR
4000.0000 mL | Freq: Once | ORAL | Status: AC
  Administered 2020-11-28: 18:00:00 4000 mL via ORAL
  Filled 2020-11-28: qty 4000

## 2020-11-28 MED ORDER — FUROSEMIDE 40 MG PO TABS
40.0000 mg | ORAL_TABLET | Freq: Every day | ORAL | Status: DC
  Administered 2020-11-30: 40 mg via ORAL
  Filled 2020-11-28: qty 1

## 2020-11-28 MED ORDER — CLONIDINE HCL 0.1 MG PO TABS
0.3000 mg | ORAL_TABLET | Freq: Every day | ORAL | Status: DC
Start: 2020-11-28 — End: 2020-12-06
  Administered 2020-11-28 – 2020-12-05 (×8): 0.3 mg via ORAL
  Filled 2020-11-28 (×8): qty 3

## 2020-11-28 MED ORDER — NYSTATIN 100000 UNIT/ML MT SUSP
5.0000 mL | Freq: Four times a day (QID) | OROMUCOSAL | Status: DC
  Administered 2020-11-28 – 2020-11-29 (×4): 500000 [IU] via ORAL
  Filled 2020-11-28 (×5): qty 5

## 2020-11-28 MED ORDER — FUROSEMIDE 10 MG/ML IJ SOLN
40.0000 mg | Freq: Once | INTRAMUSCULAR | Status: AC
  Administered 2020-11-28: 14:00:00 40 mg via INTRAVENOUS
  Filled 2020-11-28: qty 4

## 2020-11-28 MED ORDER — ALBUMIN HUMAN 25 % IV SOLN
25.0000 g | Freq: Once | INTRAVENOUS | Status: AC
  Administered 2020-11-28: 14:00:00 25 g via INTRAVENOUS
  Filled 2020-11-28: qty 100

## 2020-11-28 NOTE — Progress Notes (Signed)
OT Cancellation Note  Patient Details Name: Autumn [name] MRN: [id number] DOB: Jun 02, 1950   Cancelled Treatment:    Reason Eval/Treat Not Completed: Patient declined, no reason specified;Fatigue/lethargy limiting ability to participate. Attempted x2 this date, pt defers citing fatigue following PT this AM and following x4 bowel movements this PM. Will re-attempt next date as able to complete re-evaluation.   Dessie Coma, M.S. OTR/L  11/28/20, 3:35 PM  ascom [phone]

## 2020-11-28 NOTE — Progress Notes (Signed)
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  She'd been following with a [facility] podiatrist outpatient and had recently refused surgery.  She'd recently been treated with antibiotics at [facility] for the diabetic foot infection with enterobacter aerogans culture positive.  She was admitted on 10/29 due to falls at home with worsening L foot chronic diabetic ulcer with osteomyelitis of the 2nd and 3rd metatarsals and cellulitis noted on MRI.  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  Her post operative course was complicated by unresponsiveness and neurologic changes, she was found to have a stroke and was intubated for airway protection.  She was extubated 11/3 and transferred to [facility] service on 11/4.  Hospital course further complicated by acute on chronic anemia with melena after starting on anticoagulation, requiring blood transfusions and GI evaluation.    Assessment & Plan   Principal Problem:   Sepsis ([place]) Active Problems:   Atrial fibrillation with RVR (HCC)   Osteomyelitis of foot, left, acute (HCC)   CKD stage 4 due to type 2 diabetes mellitus (HCC)   Liver cirrhosis secondary to NASH (nonalcoholic steatohepatitis) (HCC)   Hypertension   History of anemia due to CKD   Frequent falls   AKI (acute kidney injury) ([place])   Cellulitis and abscess of foot, except toes   Infectious tenosynovitis   Wheeze   Atrial fibrillation with rapid ventricular response (HCC)   Bilateral carotid artery stenosis   Acute Blood Loss Anemia Iron deficiency Anemia of CKD In setting of heparin and recent surgical procedure - postop bleeding from surgical wound Received total 4 units  pRBC's, most recently on 11/14. Hgb stable in 8's-9's  Labs c/w iron def, vit B12 elevated, folate wnl.   11/17: Hemoglobin improved from 8.2-9.1,  --Monitor Hgb, transfuse to keep Hgb >7 --Continue iron suppl --Hold Eliquis --GI consult as below --Colonoscopy planned for tomorrow if prep completed (canceled today, did not complete prep) --May require NGT for prep administration if dysphagia is an issue, pt aware and agreeable   GI bleed - with melena with blood clots seen 11/15.   Pt had been started on Eliquis for 2ndary stroke prevention due to embolic stroke this admission. EGD on  11/16 -white nummular lesions in the esophageal mucosa (yeast on brushings), gastritis, erythematous duodenopathy --GI consulted --Hold Eliquis --Increase IV PPI to BID  Esophageal yeast - nystatin 4 times daily started   Stroke Expressive aphasia, improved Neurology suspects periprocedural vs related to atrial fibrillation MRI brian with acute to early subacute infarction in the L posterior frontal cortical and subcortical brain, possibly affecting the precentral gyrus.   Echo with EF 55-60%, dilated LA, patent foramen ovale Carotid US right ICA artery worrisome for a subtotal occlusion.  Moderate to large amount of L sided atheroscelotic plaque, however, Left carotid angiogram with Dr. Delana Meyer showed no significant stenosis. Cleared to resume Eliquis on 11/11 for 2ndary stroke prevention. --Eliquis now on hold due to GI bleed --Continue statin --Anticipate discharge to SNF (CIR recommended but not covered by her insurance)   Acute Metabolic Encephalopathy - Related to acute infection and above --  Delirium precautions --Follow and w/u further as indicated   Septic Shock Diabetic Foot Infection Left Foot Osteomyelitis and Cellulitis  Septic Arthritis MRI foot with septic arthritis of second MTP joint and osteomyelitis of 2nd metatarsal head and second proximal phalanx, severe soft tissue swelling  around second metatarsal neck concerning for phlegmon developing abscess measuring 2.1x1 cm circumferentially around distal shaft.  Mild early osteomyelitis of 3rd metatarsal head.  Severe soft tissue edema of the forefoot c/w cellulitis. MRI ankle limited -> interval resection of 2nd metatarsal, longitudinal tear of peroneus brevis with peroneus tenosynovitis, distal tibialis posterior tenosynovitis and tendinopathy, extensor digitorum tenosynovitis.  Extensive subcutaneous edema circumferentially in distal calf.  Dorsal subcutaneous edema in foot. S/p 2nd ray amputation, I&D, and bone bx of 3rd metatarsal on 11/1 --ID consulted --Continue Unasyn x 4 weeks until 12/09/20 - followed by PO augmentin until 12/23/20 --NWB LLE --Continue wound VAC --ID followup as outpatient   Acute Hypoxic Respiratory Failure Aspiration Pneumonia, treated Pulm edema 2/2 fluid overload CXR 11/3 with right lung opacity concerning for superimposed infection - also with findings concerning for pulmonary edema and small right effusion Aspiration PNA considered, treated with IV Unasyn (for foot wound) Rapid response called early morning 11/10 for respiratory distress.   CXR showed pulm edema (pt had received MIVF for 4 days before I d/c'ed it yesterday).   Started on BiPAP, and given IV lasix 40 x1, 80 x1, metolazone x1 Currently stable on 2L, BiPAP at night --Continue supplemental O2 to keep sats >=90%, wean as tolerated --BiPAP nightly only for likely OSA --diuretic per nephrology given CKD4   Atrial Fibrillation with RVR - developed while meds were held for NPO status and procedures Cardiology increased rate control agents, now HR controlled.   Lopressor has been held half of the time due to low HR, discontinued. --Continue Cardizem 240 mg daily --d/c Lopressor  --hold Eliquis due to GI bleed   Hypertension - BP's now elevated. --Continue Cardizem 240 mg daily --Lopressor d/c'd due to bradycardia --Continue  hydralazine 50 mg q8h --Resume home clonidine nightly (11/17) --PRN IV hydralazine for SBP>180 --allow BP to run on the high side due to GI bleed   Thrombocytopenia - HIT Ab negative.  Likely due to chronic liver disease. Platelets have been stable in the 80s to 90s. --monitor CBC   Dysphagia - 2/2 stroke --SLP following, seen on 11/12 --on Dysphagia 3 diet, thin liquid   AKI on CKD IV - Baseline creatinine around 3.2.   CKD secondary to diabetes.  AKI secondary to ATN. --Nephrology following --hold home lisinopril and Bumex   Elevated Troponin - due to demand ischemia.  --Cardiology following   Hx NASH cirrhosis -overall compensated at this time, but monitor closely for third spacing and edema.  Appears to be on Bumex 4 mg twice daily at home, without spironolactone. -- Bumex on hold due to AKI 11/17: Patient with significant weight gain with diuretics on hold (15 pounds in last 10 days),  --Single dose IV Lasix with albumin today --Strict I/O's Daily weights to monitor volume status closely   Hypothyroidism - Synthroid   Type 2 diabetes -controlled, last A1c 6.3%. Glucose has been inpatient goal or lower --no need for CBGs and SSI --Monitor fasting glucose with BMP  Obesity: Body mass index is 35.02 kg/m.  Complicates overall care and prognosis.  Recommend lifestyle modifications including physical activity and diet for weight loss and overall long-term health.   DVT prophylaxis: Place and maintain sequential compression  device Start: 11/15/20 1527   Diet:  Diet Orders (From admission, onward)     Start     Ordered   11/29/20 0001  Diet NPO time specified  Diet effective midnight        11/28/20 1047   11/28/20 1047  Diet clear liquid Room service appropriate? Yes; Fluid consistency: Thin  Diet effective now       Question Answer Comment  Room service appropriate? Yes   Fluid consistency: Thin      11/28/20 1047              Code Status: Full Code    Subjective 11/28/20    Patient was not able to drink full bowel prep overnight, colonoscopy today was postponed.  Patient awake laying in bed when seen today.  She denies any pain in her foot.  Says she had some nausea earlier this morning but improved now.  Denies fevers or chills, chest pain shortness of breath or other acute complaints.   Disposition Plan & Communication   Status is: Inpatient  Remains inpatient appropriate because: Severity of illness with acute on chronic anemia and ongoing GI evaluation    Consults, Procedures, Significant Events   Consultants:  Gastroenterology Nephrology Infectious disease Cardiology Podiatry Palliative care Neurology  Procedures:  Left foot second ray amputation 11/1 EGD 11/16  Antimicrobials:  Anti-infectives (From admission, onward)    Start     Dose/Rate Route Frequency Ordered Stop   11/23/20 0000  ceFAZolin (ANCEF) IVPB 1 g/50 mL premix       Note to Pharmacy: To be given in specials   1 g 100 mL/hr over 30 Minutes Intravenous  Once 11/22/20 1106 11/22/20 1750   11/14/20 2200  Ampicillin-Sulbactam (UNASYN) 3 g in sodium chloride 0.9 % 100 mL IVPB        3 g 200 mL/hr over 30 Minutes Intravenous Every 12 hours 11/14/20 1610     11/14/20 0900  piperacillin-tazobactam (ZOSYN) IVPB 2.25 g  Status:  Discontinued        2.25 g 100 mL/hr over 30 Minutes Intravenous Every 8 hours 11/14/20 0805 11/14/20 1609   11/13/20 2200  piperacillin-tazobactam (ZOSYN) IVPB 3.375 g  Status:  Discontinued        3.375 g 12.5 mL/hr over 240 Minutes Intravenous Every 12 hours 11/13/20 0758 11/14/20 0805   11/13/20 0600  piperacillin-tazobactam (ZOSYN) IVPB 2.25 g        2.25 g 100 mL/hr over 30 Minutes Intravenous Every 8 hours 11/12/20 2155 11/13/20 1725   11/11/20 1600  vancomycin (VANCOCIN) IVPB 1000 mg/200 mL premix  Status:  Discontinued        1,000 mg 200 mL/hr over 60 Minutes Intravenous Every 48 hours 11/09/20 1618 11/10/20 1045    11/11/20 1558  vancomycin (VANCOCIN) powder  Status:  Discontinued          As needed 11/11/20 1558 11/11/20 1558   11/10/20 2000  vancomycin (VANCOCIN) IVPB 1000 mg/200 mL premix  Status:  Discontinued        1,000 mg 200 mL/hr over 60 Minutes Intravenous Every 48 hours 11/10/20 1918 11/12/20 0940   11/09/20 1800  ceFEPIme (MAXIPIME) 2 g in sodium chloride 0.9 % 100 mL IVPB  Status:  Discontinued        2 g 200 mL/hr over 30 Minutes Intravenous Every 24 hours 11/09/20 1619 11/12/20 2146   11/09/20 1619  vancomycin variable dose per unstable renal function (pharmacist dosing)  Status:  Discontinued         Does not apply See admin instructions 11/09/20 1619 11/12/20 1521   11/09/20 1615  vancomycin (VANCOREADY) IVPB 1750 mg/350 mL        1,750 mg 175 mL/hr over 120 Minutes Intravenous  Once 11/09/20 1606 11/09/20 1947   11/09/20 1600  metroNIDAZOLE (FLAGYL) IVPB 500 mg  Status:  Discontinued        500 mg 100 mL/hr over 60 Minutes Intravenous Every 8 hours 11/09/20 1555 11/12/20 2146         Micro    Objective   Vitals:   11/28/20 0428 11/28/20 0757 11/28/20 0816 11/28/20 1135  BP: (!) 167/99  (!) 142/85 (!) 152/88  Pulse:   95 (!) 102  Resp: 20  18 18   Temp: 97.9 F (36.6 C)  97.9 F (36.6 C) 97.7 F (36.5 C)  TempSrc: Oral     SpO2: 95% 98% 98% 97%  Weight:      Height:        Intake/Output Summary (Last 24 hours) at 11/28/2020 1456 Last data filed at 11/28/2020 1345 Gross per 24 hour  Intake 340 ml  Output 700 ml  Net -360 ml   Filed Weights   11/26/20 0600 11/27/20 0600 11/28/20 0259  Weight: 94 kg 94 kg 98.4 kg    Physical Exam:  General exam: awake, alert, no acute distress, obese HEENT: Hearing grossly normal, moist mucous membranes, clear conjunctiva Respiratory system: Lungs clear bilaterally with diminished bases, normal respiratory effort on room air. Cardiovascular system: normal S1/S2, RRR Central nervous system: A&O x3. no gross focal  neurologic deficits, dysarthric speech Extremities: Left foot wound VAC in place, left foot elevated on pillow, trace lower extremity edema bilaterally Psychiatry: normal mood, congruent affect, judgement and insight appear normal  Labs   Data Reviewed: I have personally reviewed following labs and imaging studies  CBC: Recent Labs  Lab 11/25/20 0430 11/26/20 0057 11/26/20 1524 11/27/20 0501 11/28/20 0556  WBC 7.3 8.8 12.4* 8.4 7.5  HGB 7.3* 8.8* 9.3* 8.2* 9.1*  HCT 23.2* 27.6* 29.2* 26.8* 29.2*  MCV 95.5 95.2 94.5 96.1 97.0  PLT 82* 85* 96* 79* 80*   Basic Metabolic Panel: Recent Labs  Lab 11/24/20 0500 11/25/20 0430 11/26/20 0057 11/27/20 0501 11/28/20 0556  NA 140 142 141 140 141  K 4.6 4.6 4.2 4.1 4.2  CL 105 109 105 107 109  CO2 23 25 25 25 22   GLUCOSE 113* 135* 103* 90 91  BUN 66* 65* 63* 55* 48*  CREATININE 3.62* 3.69* 3.88* 3.66* 3.53*  CALCIUM 8.7* 8.5* 8.5* 8.5* 8.2*  MG 2.0 2.0 1.9 1.8 1.9   GFR: Estimated Creatinine Clearance: 18 mL/min (A) (by C-G formula based on SCr of 3.53 mg/dL (H)). Liver Function Tests: No results for input(s): AST, ALT, ALKPHOS, BILITOT, PROT, ALBUMIN in the last 168 hours. No results for input(s): LIPASE, AMYLASE in the last 168 hours. No results for input(s): AMMONIA in the last 168 hours. Coagulation Profile: Recent Labs  Lab 11/22/20 0709  INR 1.3*   Cardiac Enzymes: No results for input(s): CKTOTAL, CKMB, CKMBINDEX, TROPONINI in the last 168 hours. BNP (last 3 results) No results for input(s): PROBNP in the last 8760 hours. HbA1C: No results for input(s): HGBA1C in the last 72 hours. CBG: Recent Labs  Lab 11/23/20 1125 11/23/20 1618 11/24/20 0825 11/24/20 1304 11/27/20 1054  GLUCAP 157* 120* 107* 132* 98   Lipid Profile: No results for input(s): CHOL, HDL,  LDLCALC, TRIG, CHOLHDL, LDLDIRECT in the last 72 hours. Thyroid Function Tests: No results for input(s): TSH, T4TOTAL, FREET4, T3FREE, THYROIDAB in the  last 72 hours. Anemia Panel: No results for input(s): VITAMINB12, FOLATE, FERRITIN, TIBC, IRON, RETICCTPCT in the last 72 hours. Sepsis Labs: No results for input(s): PROCALCITON, LATICACIDVEN in the last 168 hours.   Recent Results (from the past 240 hour(s))  KOH prep     Status: None   Collection Time: 11/27/20 11:40 AM   Specimen: Esophagus  Result Value Ref Range Status   Specimen Description ESOPHAGUS  Final   Special Requests Normal  Final   KOH Prep   Final    YEAST PRESENT Performed at [facility], [address], [place], [place] [address]    Report Status 11/27/2020 FINAL  Final      Imaging Studies   No results found.   Medications   Scheduled Meds:  sodium chloride   Intravenous Once   allopurinol  50 mg Oral QODAY   vitamin C  500 mg Oral BID   atorvastatin  40 mg Oral Daily   budesonide (PULMICORT) nebulizer solution  0.5 mg Nebulization BID   cloNIDine  0.3 mg Oral QHS   diltiazem  240 mg Oral Daily   diltiazem  10 mg Intravenous Once   docusate  100 mg Oral BID   ferrous gluconate  324 mg Oral Q breakfast   hydrALAZINE  50 mg Oral Q8H   levothyroxine  100 mcg Oral Q0600   mouth rinse  15 mL Mouth Rinse BID   metoprolol tartrate  50 mg Oral BID   multivitamin with minerals  1 tablet Oral Daily   nutrition supplement (JUVEN)  1 packet Oral BID BM   nystatin  5 mL Oral QID   pantoprazole (PROTONIX) IV  40 mg Intravenous Q12H   polyethylene glycol  17 g Oral Daily   Ensure Max Protein  11 oz Oral QHS   sodium chloride flush  3 mL Intravenous Q12H   sodium chloride flush  3 mL Intravenous Q12H   venlafaxine  50 mg Oral BID   Continuous Infusions:  sodium chloride 10 mL/hr at 11/27/20 1022   sodium chloride 250 mL (11/26/20 0942)   sodium chloride 250 mL (11/25/20 1208)   ampicillin-sulbactam (UNASYN) IV 3 g (11/28/20 1006)       LOS: 19 days    Time spent: 25 minutes    Ezekiel Slocumb, DO [facility]  11/28/2020,  2:56 PM      If 7PM-7AM, please contact night-coverage. How to contact the [facility] Attending or Consulting provider [place] or covering provider during after hours [place], for this patient?    Check the care team in [facility] and look for a) attending/consulting [facility] provider listed and b) the [facility] team listed Log into [email or web address] and use [place] universal password to access. If you do not have the password, please contact the hospital operator. Locate the [facility] provider you are looking for under [facility] and page to a number that you can be directly reached. If you still have difficulty reaching the provider, please page the [facility] (Director on Call) for the Hospitalists listed on amion for assistance.

## 2020-11-28 NOTE — Progress Notes (Addendum)
Physical Therapy Re-evaluation Patient Details Name: Autumn [name] MRN: [id number] DOB: 11-Mar-1950 Today's Date: 11/28/2020   History of Present Illness 70 yo F admitted 10/29 s/p falls at home, worsening left foot chronic diabetic ulcer with osteomyelitis 2nd+3rd metatarsals and cellulitis noted on MRI, s/p L foot amputation of the 2nd metatarsal and toe, I&D deep abscess multiple fascial planes, and bone biopsy open deep third metatarsal on 10/31. Other comorbidities include sepsis without shock secondary to osteomyelitis and cellulitis left foot (IV zosyn), AKI on CKD, rapid afib (Cardizem + Heparin gtt), NSTEMI suspect demand ischemia, anemia acute on chronic. Transferred to ICU and intubated 11/2 after evolving neuro changes with unresponsiveness after coughing episode. 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    PT Comments    Pt alert, would like to know if or when colonoscopy is being performed today. Contacted RN and will update pt once information received. Pt also noted feeling cold and wet. PT performed linen, gown change with cues for positioning and slight min-A for hips. Pt requires MIN-MOD w/ bed mobility, but progressing in ability to scoot forward and backward at EOB. Pt unable to perform lateral scooting and further attempts limited secondary to nausea and dizziness during end of activity. Pt repositioned back to bed w/ vitals 164/106, 111, 98% (recheck) 155/115, HR 111. RN notified of elevated BP and further activity deferred. Discharge recommendations are being update to SNF due to pt likely being unable to participate 3 hours/day at this time. Skilled PT intervention is indicated to address deficits in function, mobility, and to return to PLOF as able.     Recommendations for follow up therapy are one component of a multi-disciplinary discharge planning process, led by  the attending physician.  Recommendations may be updated based on patient status, additional functional criteria and insurance authorization.  Follow Up Recommendations  Skilled nursing-short term rehab (<3 hours/day)     Assistance Recommended at Discharge Frequent or constant Supervision/Assistance  Equipment Recommendations  Other (comment)    Recommendations for Other Services       Precautions / Restrictions Precautions Precautions: Fall Restrictions Weight Bearing Restrictions: Yes LLE Weight Bearing: Non weight bearing Other Position/Activity Restrictions: wound vac     Mobility  Bed Mobility Overal bed mobility: Needs Assistance Bed Mobility: Rolling Rolling: Min assist   Supine to sit: Mod assist Sit to supine: Min assist   General bed mobility comments: +2 scooting higher in bed;    Transfers Overall transfer level: Needs assistance Equipment used: None           Anterior-Posterior transfers: Min guard  Lateral/Scoot Transfers: Min guard General transfer comment: A/P scooting w/ physical assist to maintain LLE NWB status; unable to scoot laterally x 1 attempt, deferred further 2/2 nausea, dizziness    Ambulation/Gait               General Gait Details: deferred   Stairs             Wheelchair Mobility    Modified Rankin (Stroke Patients Only)       Balance Overall balance assessment: Needs assistance Sitting-balance support: No upper extremity supported Sitting balance-Leahy Scale: Good         Standing balance comment: deferred                            Cognition Arousal/Alertness: Awake/alert  Behavior During Therapy: WFL for tasks assessed/performed Overall Cognitive Status: Impaired/Different from baseline Area of Impairment: Following commands;Safety/judgement;Memory                 Orientation Level: Disoriented to;Place;Time   Memory: Decreased recall of precautions Following Commands:  Follows multi-step commands with increased time;Follows one step commands with increased time Safety/Judgement: Decreased awareness of deficits;Decreased awareness of safety   Problem Solving: Slow processing;Requires verbal cues;Requires tactile cues General Comments: remembers number of repetition differences between each LE w/ physical performance        Exercises General Exercises - Lower Extremity Long Arc Quad: AROM;10 reps;Seated;Both Other Exercises Other Exercises: Opposite hand > knee coordination drills x 10 each side Other Exercises: rolling L+R, sidelying tolerance, perihygiene, don/doff gown    General Comments General comments (skin integrity, edema, etc.): (activity, seated EOB) 105 - 120 intermittent a-fib, quickly decreases at rest ~ 90 bmp w/ SpO2 > 95%; BP (semi-supine) 164/106, 111, 98%      Pertinent Vitals/Pain Pain Assessment: Faces Faces Pain Scale: Hurts a little bit Pain Location: inner thighs Pain Descriptors / Indicators: Grimacing;Sore Pain Intervention(s): Limited activity within patient's tolerance;Monitored during session    Home Living                          Prior Function            PT Goals (current goals can now be found in the care plan section) Additional Goals Additional Goal #1: Pt will maintain NWB precautions without physical assist in order to allow full healing of L foot Progress towards PT goals: Progressing toward goals    Frequency    7X/week      PT Plan Discharge plan needs to be updated    Co-evaluation              AM-PAC PT "6 Clicks" Mobility   Outcome Measure  Help needed turning from your back to your side while in a flat bed without using bedrails?: A Little Help needed moving from lying on your back to sitting on the side of a flat bed without using bedrails?: A Little Help needed moving to and from a bed to a chair (including a wheelchair)?: A Lot Help needed standing up from a chair  using your arms (e.g., wheelchair or bedside chair)?: Total Help needed to walk in hospital room?: Total Help needed climbing 3-5 steps with a railing? : Total 6 Click Score: 11    End of Session Equipment Utilized During Treatment: Oxygen Activity Tolerance: Other (comment) (limited by nausea, dizziness) Patient left: in bed;with bed alarm set;with call bell/phone within reach Nurse Communication: Mobility status PT Visit Diagnosis: Other abnormalities of gait and mobility (R26.89);Hemiplegia and hemiparesis;Muscle weakness (generalized) (M62.81);History of falling (Z91.81) Hemiplegia - Right/Left: Right Hemiplegia - dominant/non-dominant: Dominant     Time: 3382-5053 PT Time Calculation (min) (ACUTE ONLY): 35 min  Charges:  $Therapeutic Exercise: 8-22 mins $Therapeutic Activity: 8-22 mins                     [employer], SPT

## 2020-11-28 NOTE — Plan of Care (Signed)
  Problem: Clinical Measurements: Goal: Will remain free from infection Outcome: Progressing   Problem: Clinical Measurements: Goal: Respiratory complications will improve Outcome: Progressing   Problem: Elimination: Goal: Will not experience complications related to bowel motility Outcome: Progressing   Problem: Elimination: Goal: Will not experience complications related to urinary retention Outcome: Progressing   Problem: Pain Managment: Goal: General experience of comfort will improve Outcome: Progressing   Problem: Safety: Goal: Ability to remain free from injury will improve Outcome: Progressing

## 2020-11-28 NOTE — TOC Progression Note (Signed)
Transition of Care [facility]) - Progression Note    Patient Details  Name: Autumn [name] MRN: [id number] Date of Birth: 08/04/1950  Transition of Care [facility]) CM/SW [place], [place] Phone Number: 11/28/2020, 10:11 AM  Clinical Narrative:     Patient's insurance appears to be out of network with most SNF facilities, already established insurance will not cover recommended CIR.   VM left with friend [name] to discuss options should a facility not be identified where her insurance is covered. Most options appear to be in extended geographical areas with no local facilities accepting insurance.   Pending call back from friend [name] to discuss options.   Expected Discharge Plan: [place] Barriers to Discharge: Continued Medical Work up  Expected Discharge Plan and Services Expected Discharge Plan: [place] arrangements for the past 2 months: Single Family Home                                       Social Determinants of Health (SDOH) Interventions    Readmission Risk Interventions No flowsheet data found.

## 2020-11-28 NOTE — Care Plan (Signed)
Only has 1/4 of bowel prep so will re-schedule for tomorrow. Will need to drink 1/2 of prep tonight and other 1/2 tomorrow morning with goal of being finished by 10 am. Clears now and NPO except prep at midnight. Had candida in esophagus so can treat with fluconazole. Further recs after procedure tomorrow.  Raylene Miyamoto MD, MPH [place]

## 2020-11-28 NOTE — Progress Notes (Signed)
[facility]  PROGRESS NOTE   Subjective:   Colonoscopy for today.   UOP 253mL recorded  Creatinine 3.53 (3.66)   Objective:  Vital signs in last 24 hours:  Temp:  [97.7 F (36.5 C)-98 F (36.7 C)] 97.7 F (36.5 C) (11/17 1135) Pulse Rate:  [79-110] 102 (11/17 1135) Resp:  [18-20] 18 (11/17 1135) BP: (125-167)/(85-99) 152/88 (11/17 1135) SpO2:  [95 %-100 %] 97 % (11/17 1135) Weight:  [98.4 kg] 98.4 kg (11/17 0259)  Weight change: 4.431 kg Filed Weights   11/26/20 0600 11/27/20 0600 11/28/20 0259  Weight: 94 kg 94 kg 98.4 kg    Intake/Output: I/O last 3 completed shifts: In: 440 [P.O.:240; I.V.:100; IV Piggyback:100] Out: 200 [Urine:200]   Intake/Output this shift:  Total I/O In: -  Out: 500 [Urine:500]  Physical Exam: General:  No acute distress  Head:  Normocephalic, atraumatic. Moist oral mucosal membranes  Eyes:  Anicteric  Neck:  Supple  Lungs:   Clear to auscultation, normal effort  Heart:  regular  Abdomen:   Soft, nontender, bowel sounds present  Extremities:  Trace Left lower extremity peripheral edema. +wound vac to left foot.   Neurologic:  Awake, alert, following commands  Skin:  No lesions       Basic Metabolic Panel: Recent Labs  Lab 11/24/20 0500 11/25/20 0430 11/26/20 0057 11/27/20 0501 11/28/20 0556  NA 140 142 141 140 141  K 4.6 4.6 4.2 4.1 4.2  CL 105 109 105 107 109  CO2 23 25 25 25 22   GLUCOSE 113* 135* 103* 90 91  BUN 66* 65* 63* 55* 48*  CREATININE 3.62* 3.69* 3.88* 3.66* 3.53*  CALCIUM 8.7* 8.5* 8.5* 8.5* 8.2*  MG 2.0 2.0 1.9 1.8 1.9     CBC: Recent Labs  Lab 11/25/20 0430 11/26/20 0057 11/26/20 1524 11/27/20 0501 11/28/20 0556  WBC 7.3 8.8 12.4* 8.4 7.5  HGB 7.3* 8.8* 9.3* 8.2* 9.1*  HCT 23.2* 27.6* 29.2* 26.8* 29.2*  MCV 95.5 95.2 94.5 96.1 97.0  PLT 82* 85* 96* 79* 80*      Urinalysis: No results for input(s): COLORURINE, LABSPEC, PHURINE, GLUCOSEU, HGBUR, BILIRUBINUR, KETONESUR,  PROTEINUR, UROBILINOGEN, NITRITE, LEUKOCYTESUR in the last 72 hours.  Invalid input(s): APPERANCEUR    Imaging: No results found.   Medications:    sodium chloride 10 mL/hr at 11/27/20 1022   sodium chloride 250 mL (11/26/20 0942)   sodium chloride 250 mL (11/25/20 1208)   ampicillin-sulbactam (UNASYN) IV 3 g (11/28/20 1006)    sodium chloride   Intravenous Once   allopurinol  50 mg Oral QODAY   vitamin C  500 mg Oral BID   atorvastatin  40 mg Oral Daily   budesonide (PULMICORT) nebulizer solution  0.5 mg Nebulization BID   diltiazem  240 mg Oral Daily   diltiazem  10 mg Intravenous Once   docusate  100 mg Oral BID   ferrous gluconate  324 mg Oral Q breakfast   hydrALAZINE  50 mg Oral Q8H   levothyroxine  100 mcg Oral Q0600   mouth rinse  15 mL Mouth Rinse BID   metoprolol tartrate  50 mg Oral BID   multivitamin with minerals  1 tablet Oral Daily   nutrition supplement (JUVEN)  1 packet Oral BID BM   nystatin  5 mL Oral QID   pantoprazole (PROTONIX) IV  40 mg Intravenous Q12H   polyethylene glycol  17 g Oral Daily   Ensure Max Protein  11 oz Oral  QHS   sodium chloride flush  3 mL Intravenous Q12H   sodium chloride flush  3 mL Intravenous Q12H   venlafaxine  50 mg Oral BID    Assessment/ Plan:     Principal Problem:   Sepsis ([place]) Active Problems:   Atrial fibrillation with RVR (HCC)   Osteomyelitis of foot, left, acute (HCC)   CKD stage 4 due to type 2 diabetes mellitus (HCC)   Liver cirrhosis secondary to NASH (nonalcoholic steatohepatitis) (HCC)   Hypertension   History of anemia due to CKD   Frequent falls   AKI (acute kidney injury) ([place])   Cellulitis and abscess of foot, except toes   Infectious tenosynovitis   Wheeze   Atrial fibrillation with rapid ventricular response (HCC)   Bilateral carotid artery stenosis  Ms. Autumn [name] is a 70 y.o. white female with hypertension, diabetes mellitus type II, peripheral vascular disease, sleep apnea,  atrial fibrillation who is admitted to [facility] on 11/09/2020 for Atrial fibrillation with rapid ventricular response ([place]) [I48.91] AKI (acute kidney injury) ([place]) [N17.9] Osteomyelitis of foot, left, acute (HCC) [D42.876] Atrial fibrillation with RVR ([place]) [I48.91] Worsening renal function [N28.9] Syncope, unspecified syncope type [R55]  Hospital course complicated by acute kidney injury and requiring amputation on 10/29 by Dr. Sherryle Lis. Patient with atrial fibrillation and acute CVA this admission.   #1: Acute kidney injury on chronic kidney disease stage IV with proteinuria: baseline creatinine of 3.19, GFR of 15 on 09/27/2020. Chronic kidney disease secondary to diabetes.  Acute kidney injury secondary to ATN - Holding lisinopril and bumetanide. No indication to restart.  - No indication for dialysis at this time.    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 Now with wound vac and IV unasyn.    #3: Anemia with chronic kidney disease: status post PRBC transfusions. Appreciate GI input. EGD with no source of bleed yesterday. Scheduled for colonoscopy today.    #4: Diabetes mellitus type II with chronic kidney disease: insulin dependent. Hemoglobin A1c of 6.3%.  - Continue glucose control  #5: Hypertension: 152/88. Home regimen also includes clonidine, lisinopril and bumetanide.  - Continue diltiazem, hydralazine, and metoprolol   #6: Gout:  - allopurinol appropriately dosed    LOS: [place], MD [facility] 11/17/202212:39 PM

## 2020-11-28 NOTE — Progress Notes (Addendum)
Update 0059: Pt only drank 1 cup (360 ml) of golytely and refused to finished. Pt was educated about its importance. MD Bridgett Larsson made aware. Will continue to monitor.  Update 0218: No new orders. Will continue to monitor.

## 2020-11-28 NOTE — TOC Progression Note (Signed)
Transition of Care [facility]) - Progression Note    Patient Details  Name: Autumn [name] MRN: [id number] Date of Birth: [birth year]  Transition of Care [facility]) CM/SW [place], [place] Phone Number: 11/28/2020, 3:02 PM  Clinical Narrative:     CSW spoke with friend [name] who reports they are in agreement with [facility] in [place], and she expressed understanding that patient's insurance was not in network with any local facilities.   CSW has reached out to [place] at [facility] to start insurance auth for patient.     Expected Discharge Plan: [place] Barriers to Discharge: Continued Medical Work up  Expected Discharge Plan and Services Expected Discharge Plan: [place] arrangements for the past 2 months: Single Family Home                                       Social Determinants of Health (SDOH) Interventions    Readmission Risk Interventions No flowsheet data found.

## 2020-11-28 NOTE — Progress Notes (Signed)
Progress Note  Patient Name: Autumn [name] Date of Encounter: 11/28/2020  [facility] Cardiologist: New to [facility]  Subjective   Reports feeling well, difficulty completing her colonoscopy prep Denies any chest pain or shortness of breath Tolerating diltiazem ER to 40 daily and metoprolol tartrate 50 twice daily Telemetry reviewed, atrial fibrillation rate 80   Inpatient Medications    Scheduled Meds:  sodium chloride   Intravenous Once   allopurinol  50 mg Oral QODAY   vitamin C  500 mg Oral BID   atorvastatin  40 mg Oral Daily   budesonide (PULMICORT) nebulizer solution  0.5 mg Nebulization BID   cloNIDine  0.3 mg Oral QHS   diltiazem  240 mg Oral Daily   diltiazem  10 mg Intravenous Once   docusate  100 mg Oral BID   ferrous gluconate  324 mg Oral Q breakfast   hydrALAZINE  50 mg Oral Q8H   levothyroxine  100 mcg Oral Q0600   mouth rinse  15 mL Mouth Rinse BID   metoprolol tartrate  50 mg Oral BID   multivitamin with minerals  1 tablet Oral Daily   nutrition supplement (JUVEN)  1 packet Oral BID BM   nystatin  5 mL Oral QID   pantoprazole (PROTONIX) IV  40 mg Intravenous Q12H   polyethylene glycol  17 g Oral Daily   polyethylene glycol-electrolytes  4,000 mL Oral Once   Ensure Max Protein  11 oz Oral QHS   sodium chloride flush  3 mL Intravenous Q12H   sodium chloride flush  3 mL Intravenous Q12H   venlafaxine  50 mg Oral BID   Continuous Infusions:  sodium chloride 10 mL/hr at 11/27/20 1022   sodium chloride 250 mL (11/26/20 0942)   sodium chloride 250 mL (11/25/20 1208)   ampicillin-sulbactam (UNASYN) IV 3 g (11/28/20 1006)   PRN Meds: sodium chloride, sodium chloride, sodium chloride, [DISCONTINUED] acetaminophen **OR** acetaminophen, acetaminophen, albuterol, diltiazem, hydrALAZINE, meclizine, ondansetron **OR** ondansetron (ZOFRAN) IV, ondansetron (ZOFRAN) IV, sodium chloride flush, sodium chloride flush   Vital Signs    Vitals:   11/28/20 0428  11/28/20 0757 11/28/20 0816 11/28/20 1135  BP: (!) 167/99  (!) 142/85 (!) 152/88  Pulse:   95 (!) 102  Resp: 20  18 18   Temp: 97.9 F (36.6 C)  97.9 F (36.6 C) 97.7 F (36.5 C)  TempSrc: Oral     SpO2: 95% 98% 98% 97%  Weight:      Height:        Intake/Output Summary (Last 24 hours) at 11/28/2020 1616 Last data filed at 11/28/2020 1508 Gross per 24 hour  Intake 680 ml  Output 700 ml  Net -20 ml   Last 3 Weights 11/28/2020 11/27/2020 11/26/2020  Weight (lbs) 217 lb 207 lb 3.7 oz 207 lb 3.7 oz  Weight (kg) 98.431 kg 94 kg 94 kg      Telemetry    Atrial fibrillation with RVR rate 80- Personally Reviewed  ECG    - Personally Reviewed  Physical Exam   Constitutional:  oriented to person, place, and time. No distress.  HENT:  Head: Grossly normal Eyes:  no discharge. No scleral icterus.  Neck: No JVD, no carotid bruits  Cardiovascular: Irregularly irregular no murmurs appreciated Pulmonary/Chest: Clear to auscultation bilaterally, no wheezes or rails Abdominal: Soft.  no distension.  no tenderness.  Musculoskeletal: Normal range of motion Neurological:  normal muscle tone. Coordination normal. No atrophy Skin: Skin warm and dry Psychiatric:  normal affect, pleasant   Labs    High Sensitivity Troponin:   Recent Labs  Lab 11/09/20 1220 11/09/20 1818 11/10/20 0115 11/10/20 0649 11/21/20 0510  TROPONINIHS 147* 151* 162* 149* 81*     Chemistry Recent Labs  Lab 11/26/20 0057 11/27/20 0501 11/28/20 0556  NA 141 140 141  K 4.2 4.1 4.2  CL 105 107 109  CO2 25 25 22   GLUCOSE 103* 90 91  BUN 63* 55* 48*  CREATININE 3.88* 3.66* 3.53*  CALCIUM 8.5* 8.5* 8.2*  MG 1.9 1.8 1.9  GFRNONAA 12* 13* 14*  ANIONGAP 11 8 10     Lipids No results for input(s): CHOL, TRIG, HDL, LABVLDL, LDLCALC, CHOLHDL in the last 168 hours.  Hematology Recent Labs  Lab 11/26/20 1524 11/27/20 0501 11/28/20 0556  WBC 12.4* 8.4 7.5  RBC 3.09* 2.79* 3.01*  HGB 9.3* 8.2* 9.1*   HCT 29.2* 26.8* 29.2*  MCV 94.5 96.1 97.0  MCH 30.1 29.4 30.2  MCHC 31.8 30.6 31.2  RDW 18.1* 18.4* 18.5*  PLT 96* 79* 80*   Thyroid No results for input(s): TSH, FREET4 in the last 168 hours.  BNP No results for input(s): BNP, PROBNP in the last 168 hours.   DDimer No results for input(s): DDIMER in the last 168 hours.   Radiology    No results found.  Cardiac Studies     Patient Profile     Autumn [name] is a 70 y.o. female with a hx of HFpEF, CKD stage IV-V, diabetes since her late 77s to early 15s with diabetic neuropathy and diabetic foot ulcer, HTN, anemia of chronic disease, hepatic cirrhosis, sleep apnea, and gout who is being seen today for the evaluation of Afib with RVR   Assessment & Plan    1.  Atrial fibrillation with RVR -Much improved heart rate on current dose of diltiazem with metoprolol No changes made Would recommend Eliquis for CHADS VASC 6 once cleared by GI and urology  2.  Respiratory distress -Respiratory status much improved with improved atrial fibrillation rate, management of blood pressure and pulmonary edema -Numerous episodes of flash pulmonary edema improved with Lasix -Needs to be initiated on oral dose Lasix daily, high risk of recurrent flash pulmonary edema events -Would start Lasix 40 oral dosing starting after colonoscopy   2.  Elevated troponin Demand ischemia in the setting of osteomyelitis, atrial fibrillation with RVR, chronic kidney disease, anemia of chronic disease, acute stroke -Medical management recommended No plan for ischemic work-up   3.  Sepsis/osteomyelitis On antibiotics Worsening left foot chronic diabetic ulcer with osteo-, cellulitis on MRI Has had amputations of toes, I&D of abscess -Has undergone PV procedure this admission High risk of recurrent infection   4.  Acute stroke Rapid response soon after admission,  transferred to ICU, intubated November 2 with evolving neurologic changes,  unresponsive Code stroke, MRI confirming acute stroke Followed by neurology Etiology possibly from atrial fibrillation --- Plan for Eliquis once cleared by neurology     Total encounter time more than 25 minutes  Greater than 50% was spent in counseling and coordination of care with the patient  [facility] will sign off.   Medication Recommendations: No further changes Other recommendations (labs, testing, etc): No further testing Follow up as an outpatient: We will arrange outpatient cardiology follow-up   For questions or updates, please contact [place] Please consult [email or web address] for contact info under        Signed, Ida Rogue, MD  11/28/2020,  4:16 PM

## 2020-11-28 NOTE — Progress Notes (Signed)
RN offered to change patient left foot dressing, patient states she would rather "wait till tonight, feels like they just changed it."

## 2020-11-29 ENCOUNTER — Encounter: Payer: Self-pay | Admitting: Internal Medicine

## 2020-11-29 ENCOUNTER — Inpatient Hospital Stay: Payer: Medicare (Managed Care) | Admitting: Anesthesiology

## 2020-11-29 ENCOUNTER — Encounter
Admission: EM | Disposition: A | Discharge status: Skilled Nursing Facility | Payer: Medicare (Managed Care) | Source: Home / Self Care | Attending: Internal Medicine

## 2020-11-29 DIAGNOSIS — N179 Acute kidney failure, unspecified: Secondary | ICD-10-CM | POA: Diagnosis not present

## 2020-11-29 DIAGNOSIS — M86172 Other acute osteomyelitis, left ankle and foot: Secondary | ICD-10-CM | POA: Diagnosis not present

## 2020-11-29 DIAGNOSIS — E1122 Type 2 diabetes mellitus with diabetic chronic kidney disease: Secondary | ICD-10-CM | POA: Diagnosis not present

## 2020-11-29 DIAGNOSIS — K7581 Nonalcoholic steatohepatitis (NASH): Secondary | ICD-10-CM | POA: Diagnosis not present

## 2020-11-29 HISTORY — PX: COLONOSCOPY WITH PROPOFOL: SHX5780

## 2020-11-29 LAB — CBC
HCT: 27.4 % — ABNORMAL LOW (ref 36.0–46.0)
Hemoglobin: 8.6 g/dL — ABNORMAL LOW (ref 12.0–15.0)
MCH: 30 pg (ref 26.0–34.0)
MCHC: 31.4 g/dL (ref 30.0–36.0)
MCV: 95.5 fL (ref 80.0–100.0)
Platelets: 71 10*3/uL — ABNORMAL LOW (ref 150–400)
RBC: 2.87 MIL/uL — ABNORMAL LOW (ref 3.87–5.11)
RDW: 18.3 % — ABNORMAL HIGH (ref 11.5–15.5)
WBC: 6.2 10*3/uL (ref 4.0–10.5)
nRBC: 0 % (ref 0.0–0.2)

## 2020-11-29 LAB — GLUCOSE, CAPILLARY
Glucose-Capillary: 98 mg/dL (ref 70–99)
Glucose-Capillary: 105 mg/dL — ABNORMAL HIGH (ref 70–99)

## 2020-11-29 LAB — BASIC METABOLIC PANEL
Anion gap: 10 (ref 5–15)
BUN: 45 mg/dL — ABNORMAL HIGH (ref 8–23)
CO2: 23 mmol/L (ref 22–32)
Calcium: 8.2 mg/dL — ABNORMAL LOW (ref 8.9–10.3)
Chloride: 104 mmol/L (ref 98–111)
Creatinine, Ser: 3.52 mg/dL — ABNORMAL HIGH (ref 0.44–1.00)
GFR, Estimated: 14 mL/min — ABNORMAL LOW (ref >60)
Glucose, Bld: 95 mg/dL (ref 70–99)
Potassium: 3.7 mmol/L (ref 3.5–5.1)
Sodium: 137 mmol/L (ref 135–145)

## 2020-11-29 LAB — MAGNESIUM: Magnesium: 1.8 mg/dL (ref 1.7–2.4)

## 2020-11-29 SURGERY — COLONOSCOPY WITH PROPOFOL
Anesthesia: General

## 2020-11-29 MED ORDER — SODIUM CHLORIDE 0.9 % IV SOLN: INTRAVENOUS | Status: DC

## 2020-11-29 MED ORDER — FLUCONAZOLE IN SODIUM CHLORIDE 200-0.9 MG/100ML-% IV SOLN
200.0000 mg | INTRAVENOUS | Status: DC
  Filled 2020-11-29: qty 100

## 2020-11-29 MED ORDER — SODIUM CHLORIDE 0.9 % IV SOLN
INTRAVENOUS | Status: DC
  Administered 2020-11-29: 1000 mL via INTRAVENOUS

## 2020-11-29 MED ORDER — FLUCONAZOLE IN SODIUM CHLORIDE 400-0.9 MG/200ML-% IV SOLN
400.0000 mg | Freq: Once | INTRAVENOUS | Status: AC
  Administered 2020-11-29: 400 mg via INTRAVENOUS
  Filled 2020-11-29: qty 200

## 2020-11-29 MED ORDER — PROPOFOL 500 MG/50ML IV EMUL
INTRAVENOUS | Status: DC | PRN
  Administered 2020-11-29: 90 ug/kg/min via INTRAVENOUS

## 2020-11-29 MED ORDER — PROPOFOL 10 MG/ML IV BOLUS
INTRAVENOUS | Status: DC | PRN
  Administered 2020-11-29: 60 mg via INTRAVENOUS

## 2020-11-29 MED ORDER — FLUCONAZOLE IN SODIUM CHLORIDE 200-0.9 MG/100ML-% IV SOLN
200.0000 mg | INTRAVENOUS | Status: DC
  Administered 2020-11-30 – 2020-12-04 (×5): 200 mg via INTRAVENOUS
  Filled 2020-11-29 (×6): qty 100

## 2020-11-29 NOTE — Plan of Care (Signed)
  Problem: Clinical Measurements: Goal: Ability to maintain clinical measurements within normal limits will improve Outcome: Progressing   Problem: Clinical Measurements: Goal: Respiratory complications will improve Outcome: Progressing   Problem: Clinical Measurements: Goal: Cardiovascular complication will be avoided Outcome: Progressing   Problem: Elimination: Goal: Will not experience complications related to bowel motility Outcome: Progressing   Problem: Elimination: Goal: Will not experience complications related to urinary retention Outcome: Progressing   Problem: Safety: Goal: Ability to remain free from injury will improve Outcome: Progressing

## 2020-11-29 NOTE — Care Management Important Message (Signed)
Important Message  Patient Details  Name: Autumn [name] MRN: [id number] Date of Birth: 22-May-1950   Medicare Important Message Given:  Yes     Dannette Barbara 11/29/2020, 2:25 PM

## 2020-11-29 NOTE — Consult Note (Addendum)
Pharmacy Antibiotic Note  Autumn [name] is a 70 y.o. female admitted on 11/09/2020 with  osteomyelitis .  Pharmacy has been consulted for Unasyn dosing. ID following.   Presented with left foot draining. PMH includes liver cirrhosis d/t NASH, HTN, thyroid disease, DM. MRI of foot c/w osteomyelitis and abscess. Chest CT c/f PNA, covered by current abx.   After cultures of left foot abscess resulted, narrowed to Unasyn as per discussion with ID.  Scr improved since admission, remains persistently elevated 3.3-3.5, consistent with previous 2 days.   Plan: Ampicillin/sulbactam 3 grams every 12 hours Planning for  IV LOT 4wks per ID rec with stop 11/28. Then plans to be on amoxicillin/clavulanate from 11/29-12/12  Monitor renal function, clinical course, LOT.   Height: 5\' 6"  (167.6 cm) Weight: 100.6 kg (221 lb 12.5 oz) IBW/kg (Calculated) : 59.3  Temp (24hrs), Avg:98 F (36.7 C), Min:97.6 F (36.4 C), Max:98.4 F (36.9 C)  Recent Labs  Lab 11/25/20 0430 11/26/20 0057 11/26/20 1524 11/27/20 0501 11/28/20 0556 11/29/20 0538  WBC 7.3 8.8 12.4* 8.4 7.5 6.2  CREATININE 3.69* 3.88*  --  3.66* 3.53* 3.52*     Estimated Creatinine Clearance: 18.3 mL/min (A) (by C-G formula based on SCr of 3.52 mg/dL (H)).    Allergies  Allergen Reactions   Codeine Nausea And Vomiting    Antimicrobials this admission: ongoing 11/3 Unasyn >> Completed 10/29 Vancomycin >> 10/31 10/29 Cefepime >> 11/01 10/29 Metronidazole >> 11/01 11/02 Zosyn >> 11/3  Microbiology results: 10/30 MRSA PCR: (-) 10/31 Wound (2nd metatarsal): strep mitis/oralis, pasteurella, MSSA, bacteroides 10/31 Wound (metatarsal): bacteroides, peptostreptococcus micros 10/31 Abscess (left foot): MSSA, pasteurella 11/2 Bcx Ng5d (final)  Thank you for allowing pharmacy to be a part of this patient's care.  Wynelle Cleveland, PharmD Pharmacy Resident  11/29/2020 1:33 PM

## 2020-11-29 NOTE — Anesthesia Preprocedure Evaluation (Addendum)
Anesthesia Evaluation  Patient identified by MRN, date of birth, ID band Patient awake    Reviewed: Allergy & Precautions, NPO status , Patient's Chart, lab work & pertinent test results  History of Anesthesia Complications Negative for: history of anesthetic complications  Airway Mallampati: III  TM Distance: >3 FB Neck ROM: full    Dental  (+) Poor Dentition, Chipped, Missing   Pulmonary sleep apnea and Continuous Positive Airway Pressure Ventilation , former smoker,  Recent Respiratory distress with flash pulmonary edema. Treated with diuretics.    Pulmonary exam normal        Cardiovascular hypertension, Pt. on medications and Pt. on home beta blockers +CHF (HFpEF)  Normal cardiovascular exam+ dysrhythmias Atrial Fibrillation   Pt treated by cardiology for Atrial fibrillation with RVR- diltiazem with metoprolol  Elevated troponin Demand ischemia- No plan per cards for ischemic workup   Neuro/Psych  Neuromuscular disease (Diabetic Neuropathy) CVA (Intubated 11/2. [place] deficits noted. MRI with acute CVA. Nueurology following.), Residual Symptoms negative psych ROS   GI/Hepatic negative GI ROS, Neg liver ROS, (+) Cirrhosis  (hepatic cirrhosis)      , Hepatitis -GI Bleed   Endo/Other  diabetes (Hemoglobin A1c of 6.3%. ), Well Controlled, Type 2, Insulin DependentHypothyroidism   Renal/GU CRFRenal disease ( AKI on CKD stage IV-V. Followed by nephrology)  negative genitourinary   Musculoskeletal  (+) Arthritis , Gout   Abdominal (+) + obese,   Peds  Hematology  (+) Blood dyscrasia (anemia of chronic disease. Status post PRBC transfusions), anemia ,   Anesthesia Other Findings  Past Medical History: No date: Diabetes mellitus without complication ([place]) No date: Hypertension No date: Kidney stone No date: Thyroid disease  Past Surgical History: No date: ABDOMINAL HYSTERECTOMY 11/11/2020: AMPUTATION; Left      Comment:  Procedure: AMPUTATION RAY - 2nd metatarsal and 3rd               metatarsal head;  Surgeon: [name], DPM;                Location: [facility] ORS;  Service: Podiatry;  Laterality:               Left; 11/22/2020: CAROTID ANGIOGRAPHY; Left     Comment:  Procedure: CAROTID ANGIOGRAPHY;  Surgeon: Katha Cabal, MD;  Location: [facility];  Service:              Cardiovascular;  Laterality: Left; No date: URETERAL STENT PLACEMENT  BMI    Body Mass Index: 33.45 kg/m      Reproductive/Obstetrics negative OB ROS                            Anesthesia Physical  Anesthesia Plan  ASA: 3  Anesthesia Plan: General   Post-op Pain Management:    Induction: Intravenous  PONV Risk Score and Plan: Propofol infusion and TIVA  Airway Management Planned: Natural Airway and Nasal Cannula  Additional Equipment:   Intra-op Plan:   Post-operative Plan:   Informed Consent: I have reviewed the patients History and Physical, chart, labs and discussed the procedure including the risks, benefits and alternatives for the proposed anesthesia with the patient or authorized representative who has indicated his/her understanding and acceptance.     Dental Advisory Given  Plan Discussed with: Anesthesiologist, CRNA and Surgeon  Anesthesia Plan Comments: (Patient consented for  risks of anesthesia including but not limited to:  - adverse reactions to medications - risk of airway placement if required - damage to eyes, teeth, lips or other oral mucosa - nerve damage due to positioning  - sore throat or hoarseness - Damage to heart, brain, nerves, lungs, other parts of body or loss of life  Patient voiced understanding.)       Anesthesia Quick Evaluation

## 2020-11-29 NOTE — Progress Notes (Signed)
SLP Cancellation Note  Patient Details Name: Autumn [name] MRN: [id number] DOB: 09/08/1950   Cancelled treatment:       Reason Eval/Treat Not Completed: Patient at procedure or test/unavailable (pt off unit). Recommend continue general aspiration precautions as placed; communication strategies instructed on. ST services rec'd at Discharge for motor speech deficits.    Orinda Kenner, MS, CCC-SLP Speech Language Pathologist Rehab Services [phone] [facility] 11/29/2020, 4:44 PM

## 2020-11-29 NOTE — Progress Notes (Addendum)
Pt is NPO but have a scheduled  hydralazine and synthroid PO. NP Randol Kern made aware. Will continue to monitor.  Update 0531 : NP Randol Kern ordered  NPO except sip with medicine. Will continue to monitor.  Update [phone]: Pt drank atleast 3 cups ( 1080 ml) of golytely). Will continue to monitor.

## 2020-11-29 NOTE — Care Plan (Signed)
Colonoscopy with poor prep. Given concern for f/u I went ahead and removed large polyps. No source of bleeding identified but no large masses noted. Given overall negative EGD/Colonoscopy would need to weigh the risk vs benefit of restarting anticoagulation in this patient with thrombocytopenia from cirrhosis and also a. Fib and recent stroke. Fortunately no varices seen on EGD. If anticoagulation is restarted, would recommend not restarting for three days given several polyps were removed. Please call with concerns or questions.  Raylene Miyamoto MD, MPH [place]

## 2020-11-29 NOTE — Progress Notes (Addendum)
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  She'd been following with a [facility] podiatrist outpatient and had recently refused surgery.  She'd recently been treated with antibiotics at [facility] for the diabetic foot infection with enterobacter aerogans culture positive.  She was admitted on 10/29 due to falls at home with worsening L foot chronic diabetic ulcer with osteomyelitis of the 2nd and 3rd metatarsals and cellulitis noted on MRI.  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  Her post operative course was complicated by unresponsiveness and neurologic changes, she was found to have a stroke and was intubated for airway protection.  She was extubated 11/3 and transferred to [facility] service on 11/4.  Hospital course further complicated by acute on chronic anemia with melena after starting on anticoagulation, requiring blood transfusions and GI evaluation.    Assessment & Plan   Principal Problem:   Sepsis ([place]) Active Problems:   Atrial fibrillation with RVR (HCC)   Osteomyelitis of foot, left, acute (HCC)   CKD stage 4 due to type 2 diabetes mellitus (HCC)   Liver cirrhosis secondary to NASH (nonalcoholic steatohepatitis) (HCC)   Hypertension   History of anemia due to CKD   Frequent falls   AKI (acute kidney injury) ([place])   Cellulitis and abscess of foot, except toes   Infectious tenosynovitis   Wheeze   Atrial fibrillation with rapid ventricular response (HCC)   Bilateral carotid artery stenosis   Acute Blood Loss Anemia Iron deficiency Anemia of CKD Acute anemia occurred in setting of heparin and recent surgical procedure - postop bleeding from surgical  wound Received total 4 units pRBC's, most recently on 11/14. Hgb stable in 8's-9's  Labs c/w iron def, vit B12 elevated, folate wnl.   --Monitor Hgb, transfuse to keep Hgb >7 --Continue iron suppl --Hold Eliquis --GI consult as below   GI bleed - with melena with blood clots seen 11/15.   Pt had been started on Eliquis for 2ndary stroke prevention due to embolic stroke this admission. EGD on  11/16 -white nummular lesions in the esophageal mucosa (yeast on brushings), gastritis, erythematous duodenopathy --GI consulted - plan for colonoscopy today if adequate prep consumed --Hold Eliquis --IV PPI BID  Esophageal candidiasis - fluconazole 400 mg IV x 1 then 200 mg daily x 14-21 days (IV for now, switch to PO when diet resumed after scopes)     Stroke Expressive aphasia, improved Neurology suspects periprocedural vs related to atrial fibrillation MRI brian with acute to early subacute infarction in the L posterior frontal cortical and subcortical brain, possibly affecting the precentral gyrus.   Echo with EF 55-60%, dilated LA, patent foramen ovale Carotid US right ICA artery worrisome for a subtotal occlusion.  Moderate to large amount of L sided atheroscelotic plaque, however, Left carotid angiogram with Dr. Delana Meyer showed no significant stenosis. Cleared to resume Eliquis on 11/11 for 2ndary stroke prevention. --Eliquis now on hold due to GI bleed --Continue statin --Anticipate discharge to SNF (CIR recommended but not covered by her insurance)   Acute Metabolic Encephalopathy - Related to acute infection and above --Delirium precautions --Follow  and w/u further as indicated   Septic Shock Diabetic Foot Infection Left Foot Osteomyelitis and Cellulitis  Septic Arthritis MRI foot with septic arthritis of second MTP joint and osteomyelitis of 2nd metatarsal head and second proximal phalanx, severe soft tissue swelling around second metatarsal neck concerning for phlegmon  developing abscess measuring 2.1x1 cm circumferentially around distal shaft.  Mild early osteomyelitis of 3rd metatarsal head.  Severe soft tissue edema of the forefoot c/w cellulitis. MRI ankle limited -> interval resection of 2nd metatarsal, longitudinal tear of peroneus brevis with peroneus tenosynovitis, distal tibialis posterior tenosynovitis and tendinopathy, extensor digitorum tenosynovitis.  Extensive subcutaneous edema circumferentially in distal calf.  Dorsal subcutaneous edema in foot. S/p 2nd ray amputation, I&D, and bone bx of 3rd metatarsal on 11/1 --ID consulted --Continue Unasyn x 4 weeks until 12/09/20 - followed by PO augmentin until 12/23/20 --NWB LLE --Continue wound VAC --ID followup as outpatient   Acute Hypoxic Respiratory Failure Aspiration Pneumonia, treated Pulm edema 2/2 fluid overload w/ hx of recurrent flash pulm edema CXR 11/3 with right lung opacity concerning for superimposed infection - also with findings concerning for pulmonary edema and small right effusion Aspiration PNA considered, treated with IV Unasyn (for foot wound) Rapid response called early morning 11/10 for respiratory distress.   CXR showed pulm edema (pt had received MIVF for 4 days before I d/c'ed it yesterday).   Started on BiPAP, and given IV lasix 40 x1, 80 x1, metolazone x1 --Continue supplemental O2 to keep sats >=90%, wean as tolerated --BiPAP nightly for likely OSA --Lasix 40 mg PO daily resumed per cardiology   Atrial Fibrillation with RVR - developed while meds were held for NPO status and procedures Cardiology increased rate control agents, now HR controlled.   Lopressor has been held half of the time due to low HR, discontinued. --Continue Cardizem 240 mg daily --d/c Lopressor  --hold Eliquis due to GI bleed   Hypertension - BP's now elevated. --Continue Cardizem 240 mg daily --Lopressor d/c'd due to bradycardia --Continue hydralazine 50 mg q8h --Resumed home clonidine  nightly (11/17) --PRN IV hydralazine for SBP>180 --allow BP to run on the high side due to GI bleed   Thrombocytopenia - HIT Ab negative.  Likely due to chronic liver disease. Platelets have been stable in the 80s to 90s. --monitor CBC   Dysphagia - 2/2 stroke --SLP following, seen on 11/12 --on Dysphagia 3 diet, thin liquid   AKI on CKD IV - Baseline creatinine around 3.2.   CKD secondary to diabetes.  AKI secondary to ATN. --Nephrology following --hold home lisinopril and Bumex   Elevated Troponin - due to demand ischemia.  --Cardiology following   Hx NASH cirrhosis -overall compensated at this time, but monitor closely for third spacing and edema.  Appears to be on Bumex 4 mg twice daily at home, without spironolactone. -- Bumex on hold due to AKI 11/17: Patient with significant weight gain with diuretics on hold (15 pounds in last 10 days),  --Single dose IV Lasix with albumin given 11/17 --PO Lasix 40 mg daily started 11/8 per cardiology --Strict I/O's Daily weights -monitor volume status closely   Hypothyroidism - Synthroid   Type 2 diabetes -controlled, last A1c 6.3%. Glucose has been inpatient goal or lower --no need for CBGs and SSI --Monitor fasting glucose with BMP  Obesity: Body mass index is 35.8 kg/m.  Complicates overall care and prognosis.  Recommend lifestyle modifications including physical activity and diet for weight loss and overall long-term health.  DVT prophylaxis: Place and maintain sequential compression device Start: 11/15/20 1527   Diet:  Diet Orders (From admission, onward)     Start     Ordered   11/29/20 0531  Diet NPO time specified Except for: Sips with Meds  Diet effective midnight       Question:  Except for  Answer:  Ferrel Logan with Meds   11/29/20 0530              Code Status: Full Code   Subjective 11/29/20    Patient still trying to drink bowel prep.  Per bedside RN, patient keeps falling asleep and not drinking it very  quickly.  Pt does not like how it tastes.  She otherwise reports feeling okay today.  Denies any specific acute complaints.     Disposition Plan & Communication   Status is: Inpatient  Remains inpatient appropriate because: Severity of illness with acute on chronic anemia and ongoing GI evaluation.    Dispo: SNF placement pending.     Consults, Procedures, Significant Events   Consultants:  Gastroenterology Nephrology Infectious disease Cardiology Podiatry Palliative care Neurology  Procedures:  Left foot second ray amputation 11/1 EGD 11/16  Antimicrobials:  Anti-infectives (From admission, onward)    Start     Dose/Rate Route Frequency Ordered Stop   11/23/20 0000  ceFAZolin (ANCEF) IVPB 1 g/50 mL premix       Note to Pharmacy: To be given in specials   1 g 100 mL/hr over 30 Minutes Intravenous  Once 11/22/20 1106 11/22/20 1750   11/14/20 2200  Ampicillin-Sulbactam (UNASYN) 3 g in sodium chloride 0.9 % 100 mL IVPB        3 g 200 mL/hr over 30 Minutes Intravenous Every 12 hours 11/14/20 1610     11/14/20 0900  piperacillin-tazobactam (ZOSYN) IVPB 2.25 g  Status:  Discontinued        2.25 g 100 mL/hr over 30 Minutes Intravenous Every 8 hours 11/14/20 0805 11/14/20 1609   11/13/20 2200  piperacillin-tazobactam (ZOSYN) IVPB 3.375 g  Status:  Discontinued        3.375 g 12.5 mL/hr over 240 Minutes Intravenous Every 12 hours 11/13/20 0758 11/14/20 0805   11/13/20 0600  piperacillin-tazobactam (ZOSYN) IVPB 2.25 g        2.25 g 100 mL/hr over 30 Minutes Intravenous Every 8 hours 11/12/20 2155 11/13/20 1725   11/11/20 1600  vancomycin (VANCOCIN) IVPB 1000 mg/200 mL premix  Status:  Discontinued        1,000 mg 200 mL/hr over 60 Minutes Intravenous Every 48 hours 11/09/20 1618 11/10/20 1045   11/11/20 1558  vancomycin (VANCOCIN) powder  Status:  Discontinued          As needed 11/11/20 1558 11/11/20 1558   11/10/20 2000  vancomycin (VANCOCIN) IVPB 1000 mg/200 mL premix   Status:  Discontinued        1,000 mg 200 mL/hr over 60 Minutes Intravenous Every 48 hours 11/10/20 1918 11/12/20 0940   11/09/20 1800  ceFEPIme (MAXIPIME) 2 g in sodium chloride 0.9 % 100 mL IVPB  Status:  Discontinued        2 g 200 mL/hr over 30 Minutes Intravenous Every 24 hours 11/09/20 1619 11/12/20 2146   11/09/20 1619  vancomycin variable dose per unstable renal function (pharmacist dosing)  Status:  Discontinued         Does not apply See admin instructions 11/09/20 1619 11/12/20 1521   11/09/20 1615  vancomycin (VANCOREADY) IVPB  1750 mg/350 mL        1,750 mg 175 mL/hr over 120 Minutes Intravenous  Once 11/09/20 1606 11/09/20 1947   11/09/20 1600  metroNIDAZOLE (FLAGYL) IVPB 500 mg  Status:  Discontinued        500 mg 100 mL/hr over 60 Minutes Intravenous Every 8 hours 11/09/20 1555 11/12/20 2146         Micro    Objective   Vitals:   11/29/20 0452 11/29/20 0536 11/29/20 0805 11/29/20 1145  BP: (!) 142/80  129/73 139/84  Pulse: 95  69 80  Resp: 18  18 18   Temp: 97.6 F (36.4 C)  97.6 F (36.4 C) 98.3 F (36.8 C)  TempSrc:      SpO2: 100%  100% 98%  Weight:  100.6 kg    Height:        Intake/Output Summary (Last 24 hours) at 11/29/2020 1455 Last data filed at 11/29/2020 1146 Gross per 24 hour  Intake 198.49 ml  Output 1600 ml  Net -1401.51 ml   Filed Weights   11/27/20 0600 11/28/20 0259 11/29/20 0536  Weight: 94 kg 98.4 kg 100.6 kg    Physical Exam:  General exam: awake, appears fatigued, no acute distress, obese Respiratory system: normal respiratory effort on room air. Cardiovascular system: normal S1/S2, RRR, LE edema trace+ Central nervous system: A&O x3. no gross focal neurologic deficits, stable dysarthric speech Extremities: Left foot wound VAC in place Psychiatry: normal mood, congruent affect, judgement and insight appear normal  Labs   Data Reviewed: I have personally reviewed following labs and imaging studies  CBC: Recent Labs   Lab 11/26/20 0057 11/26/20 1524 11/27/20 0501 11/28/20 0556 11/29/20 0538  WBC 8.8 12.4* 8.4 7.5 6.2  HGB 8.8* 9.3* 8.2* 9.1* 8.6*  HCT 27.6* 29.2* 26.8* 29.2* 27.4*  MCV 95.2 94.5 96.1 97.0 95.5  PLT 85* 96* 79* 80* 71*   Basic Metabolic Panel: Recent Labs  Lab 11/25/20 0430 11/26/20 0057 11/27/20 0501 11/28/20 0556 11/29/20 0538  NA 142 141 140 141 137  K 4.6 4.2 4.1 4.2 3.7  CL 109 105 107 109 104  CO2 25 25 25 22 23   GLUCOSE 135* 103* 90 91 95  BUN 65* 63* 55* 48* 45*  CREATININE 3.69* 3.88* 3.66* 3.53* 3.52*  CALCIUM 8.5* 8.5* 8.5* 8.2* 8.2*  MG 2.0 1.9 1.8 1.9 1.8   GFR: Estimated Creatinine Clearance: 18.3 mL/min (A) (by C-G formula based on SCr of 3.52 mg/dL (H)). Liver Function Tests: No results for input(s): AST, ALT, ALKPHOS, BILITOT, PROT, ALBUMIN in the last 168 hours. No results for input(s): LIPASE, AMYLASE in the last 168 hours. No results for input(s): AMMONIA in the last 168 hours. Coagulation Profile: No results for input(s): INR, PROTIME in the last 168 hours.  Cardiac Enzymes: No results for input(s): CKTOTAL, CKMB, CKMBINDEX, TROPONINI in the last 168 hours. BNP (last 3 results) No results for input(s): PROBNP in the last 8760 hours. HbA1C: No results for input(s): HGBA1C in the last 72 hours. CBG: Recent Labs  Lab 11/23/20 1125 11/23/20 1618 11/24/20 0825 11/24/20 1304 11/27/20 1054  GLUCAP 157* 120* 107* 132* 98   Lipid Profile: No results for input(s): CHOL, HDL, LDLCALC, TRIG, CHOLHDL, LDLDIRECT in the last 72 hours. Thyroid Function Tests: No results for input(s): TSH, T4TOTAL, FREET4, T3FREE, THYROIDAB in the last 72 hours. Anemia Panel: No results for input(s): VITAMINB12, FOLATE, FERRITIN, TIBC, IRON, RETICCTPCT in the last 72 hours. Sepsis Labs: No results for  input(s): PROCALCITON, LATICACIDVEN in the last 168 hours.   Recent Results (from the past 240 hour(s))  KOH prep     Status: None   Collection Time: 11/27/20  11:40 AM   Specimen: Esophagus  Result Value Ref Range Status   Specimen Description ESOPHAGUS  Final   Special Requests Normal  Final   KOH Prep   Final    YEAST PRESENT Performed at [facility], [address] [place], [place] [address]    Report Status 11/27/2020 FINAL  Final      Imaging Studies   No results found.   Medications   Scheduled Meds:  sodium chloride   Intravenous Once   allopurinol  50 mg Oral QODAY   vitamin C  500 mg Oral BID   atorvastatin  40 mg Oral Daily   budesonide (PULMICORT) nebulizer solution  0.5 mg Nebulization BID   cloNIDine  0.3 mg Oral QHS   diltiazem  240 mg Oral Daily   diltiazem  10 mg Intravenous Once   docusate  100 mg Oral BID   ferrous gluconate  324 mg Oral Q breakfast   [START ON 11/30/2020] furosemide  40 mg Oral Daily   hydrALAZINE  50 mg Oral Q8H   levothyroxine  100 mcg Oral Q0600   mouth rinse  15 mL Mouth Rinse BID   metoprolol tartrate  50 mg Oral BID   multivitamin with minerals  1 tablet Oral Daily   nutrition supplement (JUVEN)  1 packet Oral BID BM   nystatin  5 mL Oral QID   pantoprazole (PROTONIX) IV  40 mg Intravenous Q12H   polyethylene glycol  17 g Oral Daily   Ensure Max Protein  11 oz Oral QHS   sodium chloride flush  3 mL Intravenous Q12H   sodium chloride flush  3 mL Intravenous Q12H   venlafaxine  50 mg Oral BID   Continuous Infusions:  sodium chloride 10 mL/hr at 11/27/20 1022   sodium chloride 250 mL (11/26/20 0942)   sodium chloride 250 mL (11/25/20 1208)   ampicillin-sulbactam (UNASYN) IV 3 g (11/29/20 1014)       LOS: 20 days    Time spent: 25 minutes with > 50% spent at bedside and in coordination of care     Ezekiel Slocumb, DO [facility]  11/29/2020, 2:55 PM      If 7PM-7AM, please contact night-coverage. How to contact the [facility] Attending or Consulting provider [place] or covering provider during after hours [place], for this patient?    Check the care  team in [facility] and look for a) attending/consulting [facility] provider listed and b) the [facility] team listed Log into [email or web address] and use [place]'s universal password to access. If you do not have the password, please contact the hospital operator. Locate the [facility] provider you are looking for under [facility] and page to a number that you can be directly reached. If you still have difficulty reaching the provider, please page the [facility] (Director on Call) for the Hospitalists listed on amion for assistance.

## 2020-11-29 NOTE — Progress Notes (Signed)
PT Cancellation Note  Patient Details Name: Autumn [name] MRN: [id number] DOB: [birth year]   Cancelled Treatment:     Therapist in to see pt who was unable to participate due to prepping for upcoming colonoscopy in an hour. Will return at a later date/time. Continue PT per POC, continue recommend SNF once medically cleared for d/c   Autumn [name] 11/29/2020, 1:00 PM

## 2020-11-29 NOTE — Transfer of Care (Signed)
Immediate Anesthesia Transfer of Care Note  Patient: Autumn [name]  Procedure(s) Performed: COLONOSCOPY WITH PROPOFOL  Patient Location: Endoscopy Unit  Anesthesia Type:General  Level of Consciousness: awake, alert  and oriented  Airway & Oxygen Therapy: Patient Spontanous Breathing and Patient connected to face mask oxygen  Post-op Assessment: Report given to RN and Post -op Vital signs reviewed and stable  Post vital signs: Reviewed and stable  Last Vitals:  Vitals Value Taken Time  BP 114/66 11/29/20 1642  Temp 36.8 C 11/29/20 1640  Pulse 58 11/29/20 1642  Resp 17 11/29/20 1642  SpO2 99 % 11/29/20 1642  Vitals shown include unvalidated device data.  Last Pain:  Vitals:   11/29/20 1640  TempSrc: Temporal  PainSc: Asleep      Patients Stated Pain Goal: 0 (12/78/71 8367)  Complications: No notable events documented.

## 2020-11-29 NOTE — Progress Notes (Signed)
[facility]  PROGRESS NOTE   Subjective:   Patient continues to complete her prep for colonoscopy this morning.   UOP 1172mL     Objective:  Vital signs in last 24 hours:  Temp:  [97.6 F (36.4 C)-98.4 F (36.9 C)] 98.3 F (36.8 C) (11/18 1145) Pulse Rate:  [69-110] 80 (11/18 1145) Resp:  [17-18] 18 (11/18 1145) BP: (129-145)/(73-92) 139/84 (11/18 1145) SpO2:  [98 %-100 %] 98 % (11/18 1145) Weight:  [100.6 kg] 100.6 kg (11/18 0536)  Weight change: 2.169 kg Filed Weights   11/27/20 0600 11/28/20 0259 11/29/20 0536  Weight: 94 kg 98.4 kg 100.6 kg    Intake/Output: I/O last 3 completed shifts: In: 778.5 [P.O.:480; IV Piggyback:298.5] Out: 1150 [Urine:1150]   Intake/Output this shift:  Total I/O In: 0  Out: 950 [Urine:950]  Physical Exam: General:  No acute distress  Head:  Normocephalic, atraumatic. Moist oral mucosal membranes  Eyes:  Anicteric  Neck:  Supple  Lungs:   Clear to auscultation, normal effort  Heart:  irregular  Abdomen:   Soft, nontender, bowel sounds present  Extremities:  Trace Left lower extremity peripheral edema. +wound vac to left foot.   Neurologic:  Awake, alert, following commands  Skin:  No lesions       Basic Metabolic Panel: Recent Labs  Lab 11/25/20 0430 11/26/20 0057 11/27/20 0501 11/28/20 0556 11/29/20 0538  NA 142 141 140 141 137  K 4.6 4.2 4.1 4.2 3.7  CL 109 105 107 109 104  CO2 25 25 25 22 23   GLUCOSE 135* 103* 90 91 95  BUN 65* 63* 55* 48* 45*  CREATININE 3.69* 3.88* 3.66* 3.53* 3.52*  CALCIUM 8.5* 8.5* 8.5* 8.2* 8.2*  MG 2.0 1.9 1.8 1.9 1.8     CBC: Recent Labs  Lab 11/26/20 0057 11/26/20 1524 11/27/20 0501 11/28/20 0556 11/29/20 0538  WBC 8.8 12.4* 8.4 7.5 6.2  HGB 8.8* 9.3* 8.2* 9.1* 8.6*  HCT 27.6* 29.2* 26.8* 29.2* 27.4*  MCV 95.2 94.5 96.1 97.0 95.5  PLT 85* 96* 79* 80* 71*      Urinalysis: No results for input(s): COLORURINE, LABSPEC, PHURINE, GLUCOSEU, HGBUR, BILIRUBINUR,  KETONESUR, PROTEINUR, UROBILINOGEN, NITRITE, LEUKOCYTESUR in the last 72 hours.  Invalid input(s): APPERANCEUR    Imaging: No results found.   Medications:    sodium chloride 10 mL/hr at 11/27/20 1022   sodium chloride 250 mL (11/26/20 0942)   sodium chloride 250 mL (11/25/20 1208)   ampicillin-sulbactam (UNASYN) IV 3 g (11/29/20 1014)    sodium chloride   Intravenous Once   allopurinol  50 mg Oral QODAY   vitamin C  500 mg Oral BID   atorvastatin  40 mg Oral Daily   budesonide (PULMICORT) nebulizer solution  0.5 mg Nebulization BID   cloNIDine  0.3 mg Oral QHS   diltiazem  240 mg Oral Daily   diltiazem  10 mg Intravenous Once   docusate  100 mg Oral BID   ferrous gluconate  324 mg Oral Q breakfast   [START ON 11/30/2020] furosemide  40 mg Oral Daily   hydrALAZINE  50 mg Oral Q8H   levothyroxine  100 mcg Oral Q0600   mouth rinse  15 mL Mouth Rinse BID   metoprolol tartrate  50 mg Oral BID   multivitamin with minerals  1 tablet Oral Daily   nutrition supplement (JUVEN)  1 packet Oral BID BM   nystatin  5 mL Oral QID   pantoprazole (PROTONIX) IV  40 mg Intravenous Q12H   polyethylene glycol  17 g Oral Daily   Ensure Max Protein  11 oz Oral QHS   sodium chloride flush  3 mL Intravenous Q12H   sodium chloride flush  3 mL Intravenous Q12H   venlafaxine  50 mg Oral BID    Assessment/ Plan:     Principal Problem:   Sepsis ([place]) Active Problems:   Atrial fibrillation with RVR (HCC)   Osteomyelitis of foot, left, acute (HCC)   CKD stage 4 due to type 2 diabetes mellitus (HCC)   Liver cirrhosis secondary to NASH (nonalcoholic steatohepatitis) (HCC)   Hypertension   History of anemia due to CKD   Frequent falls   AKI (acute kidney injury) ([place])   Cellulitis and abscess of foot, except toes   Infectious tenosynovitis   Wheeze   Atrial fibrillation with rapid ventricular response (HCC)   Bilateral carotid artery stenosis  Ms. RANIYA GOLEMBESKI is a 70 y.o. white  female with hypertension, diabetes mellitus type II, peripheral vascular disease, sleep apnea, atrial fibrillation who is admitted to [facility] on 11/09/2020 for Atrial fibrillation with rapid ventricular response ([place]) [I48.91] AKI (acute kidney injury) ([place]) [N17.9] Osteomyelitis of foot, left, acute (HCC) [Z99.357] Atrial fibrillation with RVR ([place]) [I48.91] Worsening renal function [N28.9] Syncope, unspecified syncope type [R55]  Hospital course complicated by acute kidney injury and requiring amputation on 10/29 by Dr. Sherryle Lis. Patient with atrial fibrillation and acute CVA this admission.   #1: Acute kidney injury on chronic kidney disease stage IV with proteinuria: baseline creatinine of 3.19, GFR of 15 on 09/27/2020. Chronic kidney disease secondary to diabetes.  Acute kidney injury secondary to ATN - Holding lisinopril and bumetanide.  Anticipate bumetanide PO to be restarted tomorrow.  - No indication for dialysis at this time.    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 Now with wound vac and IV unasyn.    #3: Anemia with chronic kidney disease: status post PRBC transfusions. Appreciate GI input. EGD with no source of bleed yesterday. Scheduled for colonoscopy today.    #4: Diabetes mellitus type II with chronic kidney disease: insulin dependent. Hemoglobin A1c of 6.3%.  - Continue glucose control  #5: Hypertension: 139/74. Home regimen also includes clonidine, lisinopril and bumetanide.  - Continue diltiazem, hydralazine, and metoprolol   #6: Gout:  - allopurinol appropriately dosed    LOS: [place], MD [facility] 11/18/202212:46 PM

## 2020-11-29 NOTE — Op Note (Signed)
[facility] Gastroenterology Patient Name: Autumn [name] Procedure Date: 11/29/2020 3:54 PM MRN: [id number] Account #: [id number] Date of Birth: [birth year] Admit Type: Inpatient Age: 70 Room: [facility] ENDO ROOM 3 Gender: Female Note Status: Finalized Instrument Name: Peds Colonoscope [id number] Procedure:             Colonoscopy Indications:           Melena Providers:             Andrey Farmer MD, MD Referring MD:          No Local Md, MD (Referring MD) Medicines:             Monitored Anesthesia Care Complications:         No immediate complications. Estimated blood loss:                         Minimal. Procedure:             Pre-Anesthesia Assessment:                        - Prior to the procedure, a History and Physical was                         performed, and patient medications and allergies were                         reviewed. The patient is competent. The risks and                         benefits of the procedure and the sedation options and                         risks were discussed with the patient. All questions                         were answered and informed consent was obtained.                         Patient identification and proposed procedure were                         verified by the physician, the nurse, the anesthetist                         and the technician in the endoscopy suite. Mental                         Status Examination: alert and oriented. Airway                         Examination: normal oropharyngeal airway and neck                         mobility. Respiratory Examination: clear to                         auscultation. CV Examination: normal. Prophylactic  Antibiotics: The patient does not require prophylactic                         antibiotics. Prior Anticoagulants: The patient has                         taken no previous anticoagulant or antiplatelet                         agents.  ASA Grade Assessment: IV - A patient with                         severe systemic disease that is a constant threat to                         life. After reviewing the risks and benefits, the                         patient was deemed in satisfactory condition to                         undergo the procedure. The anesthesia plan was to use                         monitored anesthesia care (MAC). Immediately prior to                         administration of medications, the patient was                         re-assessed for adequacy to receive sedatives. The                         heart rate, respiratory rate, oxygen saturations,                         blood pressure, adequacy of pulmonary ventilation, and                         response to care were monitored throughout the                         procedure. The physical status of the patient was                         re-assessed after the procedure.                        After obtaining informed consent, the colonoscope was                         passed under direct vision. Throughout the procedure,                         the patient's blood pressure, pulse, and oxygen                         saturations were monitored continuously. The  Colonoscope was introduced through the anus and                         advanced to the the cecum, identified by appendiceal                         orifice and ileocecal valve. The colonoscopy was                         performed with difficulty due to poor bowel prep and                         restricted mobility of the colon. Successful                         completion of the procedure was aided by withdrawing                         the scope and replacing with the pediatric                         colonoscope. The patient tolerated the procedure well.                         The quality of the bowel preparation was poor. Findings:      The perianal and digital  rectal examinations were normal.      A 3 mm polyp was found in the cecum. The polyp was sessile. The polyp       was removed with a cold snare. Resection and retrieval were complete. To       prevent bleeding post-intervention, one hemostatic clip was successfully       placed. There was no bleeding at the end of the procedure.      A 2 mm polyp was found in the transverse colon. The polyp was sessile.       The polyp was removed with a cold snare. Resection and retrieval were       complete. Estimated blood loss was minimal.      A 7 mm polyp was found in the proximal transverse colon. The polyp was       sessile. The polyp was removed with a cold snare. Resection and       retrieval were complete. To prevent bleeding post-intervention, one       hemostatic clip was successfully placed. There was no bleeding at the       end of the procedure.      A 10 mm polyp was found in the distal transverse colon. The polyp was       semi-pedunculated. The polyp was removed with a hot snare. Resection and       retrieval were complete. Estimated blood loss: none.      A 4 mm polyp was found in the descending colon. The polyp was sessile.       The polyp was removed with a cold snare. Resection and retrieval were       complete. To prevent bleeding post-intervention, one hemostatic clip was       successfully placed. There was no bleeding at the end of the procedure.      A few small-mouthed diverticula were found in  the sigmoid colon and       descending colon.      Internal hemorrhoids were found during retroflexion. The hemorrhoids       were Grade I (internal hemorrhoids that do not prolapse).      The exam was otherwise without abnormality on direct and retroflexion       views. Normally I would not remove polyps on colonoscopy for GI bleed       but do to concerns about follow-up, decison was made to go ahead and       remove polyps. There was a fixed area in the sigmoid colon that was        difficult to bypass so pediatric colonoscope was needed. Impression:            - Preparation of the colon was poor.                        - One 3 mm polyp in the cecum, removed with a cold                         snare. Resected and retrieved. Clip was placed.                        - One 2 mm polyp in the transverse colon, removed with                         a cold snare. Resected and retrieved.                        - One 7 mm polyp in the proximal transverse colon,                         removed with a cold snare. Resected and retrieved.                         Clip was placed.                        - One 10 mm polyp in the distal transverse colon,                         removed with a hot snare. Resected and retrieved.                        - One 4 mm polyp in the descending colon, removed with                         a cold snare. Resected and retrieved. Clip was placed.                        - Diverticulosis in the sigmoid colon and in the                         descending colon.                        - Internal hemorrhoids.                        -  The examination was otherwise normal on direct and                         retroflexion views. Recommendation:        - Return patient to hospital ward for ongoing care.                        - Advance diet as tolerated.                        - Resume Eliquis (apixaban) at prior dose in 3 days.                        - Await pathology results.                        - No recommendation at this time regarding repeat                         colonoscopy. Can discuss risks/benefits as an                         outpatient given signficant comorbidities. Procedure Code(s):     --- Professional ---                        [phone], Colonoscopy, flexible; with removal of                         tumor(s), polyp(s), or other lesion(s) by snare                         technique Diagnosis Code(s):     --- Professional ---                         K64.0, First degree hemorrhoids                        K63.5, Polyp of colon                        K92.1, Melena (includes Hematochezia)                        K57.30, Diverticulosis of large intestine without                         perforation or abscess without bleeding CPT copyright 2019 [employer]. All rights reserved. The codes documented in this report are preliminary and upon coder review may  be revised to meet current compliance requirements. Andrey Farmer MD, MD 11/29/2020 4:49:09 PM Number of Addenda: 0 Note Initiated On: 11/29/2020 3:54 PM Scope Withdrawal Time: 0 hours 19 minutes 4 seconds  Total Procedure Duration: 0 hours 34 minutes 20 seconds  Estimated Blood Loss:  Estimated blood loss was minimal.      [facility]

## 2020-11-29 NOTE — Progress Notes (Signed)
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 Other comorbidities include sepsis without shock secondary to osteomyelitis and cellulitis left foot (IV zosyn), AKI on CKD, rapid afib (Cardizem + Heparin gtt), NSTEMI suspect demand ischemia, anemia acute on chronic. Transferred to ICU and intubated 11/2 after evolving neuro changes with unresponsiveness after coughing episode. 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   Clinical Impression   Ms. Andel seen for OT re-evaluation due to prolonged hospitalization on this date. Upon arrival to room pt awake/alert and agreeable to participating in tx within her ability. Pt MAX A for LB access. MIN A sup<>long sitting. SETUP for self drinking. Anticipate MIN A for UB dressing 2/2 limited shoulder flexion. Attempted sitting at EOB but pt became anxious 2/2 potential BM.   The Orientation Log (O-Log) is designed to be a quick quantitative measure of orientational status for use at bedside with rehabilitation inpatients. Place, time, and situational (Etiology/Event + Pathology/Deficits) domains are assessed and can be tracked over time. Patient responses are scored based on spontaneous recall, logical cueing, multiple choice, and incorrect responses. Pt scored 26/30.  Pt demonstrating consistent progress from initial score of  14/30 on 11/09.  Pt making progress toward goals. Pt continues to benefit from skilled OT services to maximize return to  PL[facility] and minimize risk of future falls, injury, caregiver burden, and readmission. Goals, frequency of treatment, and discharge recommendations updated.      Recommendations for follow up therapy are one component of a multi-disciplinary discharge planning process, led by the attending physician.  Recommendations may be updated based on patient status, additional functional criteria and insurance authorization.   Follow Up Recommendations  Skilled nursing-short term rehab (<3 hours/day)    Assistance Recommended at Discharge Frequent or constant Supervision/Assistance  Functional Status Assessment     Equipment Recommendations  Other (comment) (defer to next venue of care)    Recommendations for Other Services       Precautions / Restrictions Precautions Precautions: Fall Restrictions Weight Bearing Restrictions: Yes LLE Weight Bearing: Non weight bearing      Mobility Bed Mobility Overal bed mobility: Needs Assistance Bed Mobility:  (Supine to long sit)           General bed mobility comments: Attempted sitting at EOB but pt became anxious 2/2 potential BM    Transfers                          Balance                                           ADL either performed or assessed with clinical judgement   ADL Overall ADL's : Needs assistance/impaired  General ADL Comments: Pt MAX A for LB access. MIN A to reach long sitting for self drinking. Anticipate MIN A for UB dressing 2/2 limited shoulder flexion.      Pertinent Vitals/Pain Pain Assessment: No/denies pain     Hand Dominance     Extremity/Trunk Assessment Upper Extremity Assessment Upper Extremity Assessment: LUE deficits/detail;RUE deficits/detail RUE Deficits / Details: Grip 4-/5, LUE bicep/tripcep 4-/5. Shoulder flexion - AROM 65, PROM 160   Lower Extremity Assessment Lower Extremity Assessment: Defer to PT  evaluation       Communication     Cognition Arousal/Alertness: Awake/alert Behavior During Therapy: WFL for tasks assessed/performed Overall Cognitive Status: Impaired/Different from baseline Area of Impairment: Memory                     Memory: Decreased short-term memory               General Comments       Exercises Exercises: Other exercises Other Exercises Other Exercises: Pt educ re: importance of mvmt for functional mobility Other Exercises: Sup<>long sitting, self drinking, O-Log, MMT   Shoulder Instructions      Home Living                                          Prior Functioning/Environment                          OT Problem List:        OT Treatment/Interventions:      OT Goals(Current goals can be found in the care plan section) Acute Rehab OT Goals OT Goal Formulation: With patient Time For Goal Achievement: 12/13/20 Potential to Achieve Goals: Good ADL Goals Pt Will Perform Grooming: sitting;with set-up Pt Will Perform Lower Body Dressing: with mod assist;sitting/lateral leans Pt Will Transfer to Toilet: bedside commode;with max assist;anterior/posterior transfer  OT Frequency: Min 3X/week   Barriers to D/C:            Co-evaluation              AM-PAC OT "6 Clicks" Daily Activity     Outcome Measure Help from another person eating meals?: A Little Help from another person taking care of personal grooming?: A Little Help from another person toileting, which includes using toliet, bedpan, or urinal?: A Lot Help from another person bathing (including washing, rinsing, drying)?: A Lot Help from another person to put on and taking off regular upper body clothing?: A Little Help from another person to put on and taking off regular lower body clothing?: A Lot 6 Click Score: 15   End of Session Equipment Utilized During Treatment: Oxygen  Activity Tolerance: Other (comment);Patient tolerated  treatment well (Pt anxious to participate due to frequent BMs) Patient left: in bed;with call bell/phone within reach  OT Visit Diagnosis: Other abnormalities of gait and mobility (R26.89);Hemiplegia and hemiparesis;Muscle weakness (generalized) (M62.81)                Time: 5681-2751 OT Time Calculation (min): 10 min Charges:     Nino Glow, Markus Daft 11/29/2020, 1:17 PM

## 2020-11-29 NOTE — TOC Progression Note (Signed)
Transition of Care [facility]) - Progression Note    Patient Details  Name: Autumn [name] MRN: [id number] Date of Birth: 08-14-1950  Transition of [facility] CM/SW [place], [place] Phone Number: 11/29/2020, 2:56 PM  Clinical Narrative:     CSW notes that [facility] now reports no beds avail.   Next option was [facility], they report having beds avail and will start insurance auth in preparation for when patient is medically stable to discharge.   Juanda Bond informed of above.   Pending insurance auth and patient's medical readiness to discharge at this time.    Expected Discharge Plan: [place] Barriers to Discharge: Continued Medical Work up  Expected Discharge Plan and Services Expected Discharge Plan: [place] arrangements for the past 2 months: Single Family Home                                       Social Determinants of Health (SDOH) Interventions    Readmission Risk Interventions No flowsheet data found.

## 2020-11-30 DIAGNOSIS — N184 Chronic kidney disease, stage 4 (severe): Secondary | ICD-10-CM | POA: Diagnosis not present

## 2020-11-30 DIAGNOSIS — E1122 Type 2 diabetes mellitus with diabetic chronic kidney disease: Secondary | ICD-10-CM | POA: Diagnosis not present

## 2020-11-30 DIAGNOSIS — K7581 Nonalcoholic steatohepatitis (NASH): Secondary | ICD-10-CM | POA: Diagnosis not present

## 2020-11-30 DIAGNOSIS — M86172 Other acute osteomyelitis, left ankle and foot: Secondary | ICD-10-CM | POA: Diagnosis not present

## 2020-11-30 LAB — CBC
HCT: 27 % — ABNORMAL LOW (ref 36.0–46.0)
Hemoglobin: 8.3 g/dL — ABNORMAL LOW (ref 12.0–15.0)
MCH: 29.7 pg (ref 26.0–34.0)
MCHC: 30.7 g/dL (ref 30.0–36.0)
MCV: 96.8 fL (ref 80.0–100.0)
Platelets: 64 10*3/uL — ABNORMAL LOW (ref 150–400)
RBC: 2.79 MIL/uL — ABNORMAL LOW (ref 3.87–5.11)
RDW: 18.1 % — ABNORMAL HIGH (ref 11.5–15.5)
WBC: 4.4 10*3/uL (ref 4.0–10.5)
nRBC: 0 % (ref 0.0–0.2)

## 2020-11-30 LAB — BASIC METABOLIC PANEL
Anion gap: 9 (ref 5–15)
BUN: 44 mg/dL — ABNORMAL HIGH (ref 8–23)
CO2: 24 mmol/L (ref 22–32)
Calcium: 8.3 mg/dL — ABNORMAL LOW (ref 8.9–10.3)
Chloride: 107 mmol/L (ref 98–111)
Creatinine, Ser: 3.46 mg/dL — ABNORMAL HIGH (ref 0.44–1.00)
GFR, Estimated: 14 mL/min — ABNORMAL LOW (ref >60)
Glucose, Bld: 94 mg/dL (ref 70–99)
Potassium: 3.7 mmol/L (ref 3.5–5.1)
Sodium: 140 mmol/L (ref 135–145)

## 2020-11-30 LAB — MAGNESIUM: Magnesium: 2 mg/dL (ref 1.7–2.4)

## 2020-11-30 MED ORDER — BUMETANIDE 1 MG PO TABS
4.0000 mg | ORAL_TABLET | Freq: Every day | ORAL | Status: DC
  Administered 2020-11-30 – 2020-12-11 (×12): 4 mg via ORAL
  Filled 2020-11-30 (×12): qty 4

## 2020-11-30 NOTE — Progress Notes (Signed)
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  She'd been following with a [facility] podiatrist outpatient and had recently refused surgery.  She'd recently been treated with antibiotics at [facility] for the diabetic foot infection with enterobacter aerogans culture positive.  She was admitted on 10/29 due to falls at home with worsening L foot chronic diabetic ulcer with osteomyelitis of the 2nd and 3rd metatarsals and cellulitis noted on MRI.  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  Her post operative course was complicated by unresponsiveness and neurologic changes, she was found to have a stroke and was intubated for airway protection.  She was extubated 11/3 and transferred to [facility] service on 11/4.  Hospital course further complicated by acute on chronic anemia with melena after starting on anticoagulation, requiring blood transfusions and GI evaluation.    Assessment & Plan   Principal Problem:   Sepsis ([place]) Active Problems:   Atrial fibrillation with RVR (HCC)   Osteomyelitis of foot, left, acute (HCC)   CKD stage 4 due to type 2 diabetes mellitus (HCC)   Liver cirrhosis secondary to NASH (nonalcoholic steatohepatitis) (HCC)   Hypertension   History of anemia due to CKD   Frequent falls   AKI (acute kidney injury) ([place])   Cellulitis and abscess of foot, except toes   Infectious tenosynovitis   Wheeze   Atrial fibrillation with rapid ventricular response (HCC)   Bilateral carotid artery stenosis   Acute Blood Loss Anemia Iron deficiency Anemia of CKD Acute anemia occurred in setting of heparin and recent surgical procedure - postop bleeding from surgical  wound Received total 4 units pRBC's, most recently on 11/14. Hgb stable in 8's-9's  Labs c/w iron def, vit B12 elevated, folate wnl.   --Monitor Hgb, transfuse to keep Hgb >7 --Continue iron suppl --Hold Eliquis,  --GI consult as below   GI bleed - with melena with blood clots seen 11/15.   Pt had been started on Eliquis for 2ndary stroke prevention due to embolic stroke this admission. EGD on  11/16 -white nummular lesions in the esophageal mucosa (yeast on brushings), gastritis, erythematous duodenopathy Colonoscopy on 11/18 -poor prep, multiple polyps removed --GI consulted - plan for colonoscopy today if adequate prep consumed --Hold Eliquis x 3 days after colonoscopy given polypectomy --IV PPI BID  Esophageal candidiasis - fluconazole 400 mg IV x 1 then 200 mg daily x 14-21 days (IV for now, switch to PO when diet resumed after scopes)     Stroke Expressive aphasia, improved Neurology suspects periprocedural vs related to atrial fibrillation MRI brian with acute to early subacute infarction in the L posterior frontal cortical and subcortical brain, possibly affecting the precentral gyrus.   Echo with EF 55-60%, dilated LA, patent foramen ovale Carotid US right ICA artery worrisome for a subtotal occlusion.  Moderate to large amount of L sided atheroscelotic plaque, however, Left carotid angiogram with Dr. Delana Meyer showed no significant stenosis. Cleared to resume Eliquis on 11/11 for 2ndary stroke prevention. --Eliquis now on hold due to GI bleed --Continue statin --Anticipate discharge to SNF (CIR recommended but not covered by her  insurance)   Acute Metabolic Encephalopathy - Related to acute infection and above --Delirium precautions --Follow and w/u further as indicated   Septic Shock Diabetic Foot Infection Left Foot Osteomyelitis and Cellulitis  Septic Arthritis MRI foot with septic arthritis of second MTP joint and osteomyelitis of 2nd metatarsal head and second  proximal phalanx, severe soft tissue swelling around second metatarsal neck concerning for phlegmon developing abscess measuring 2.1x1 cm circumferentially around distal shaft.  Mild early osteomyelitis of 3rd metatarsal head.  Severe soft tissue edema of the forefoot c/w cellulitis. MRI ankle limited -> interval resection of 2nd metatarsal, longitudinal tear of peroneus brevis with peroneus tenosynovitis, distal tibialis posterior tenosynovitis and tendinopathy, extensor digitorum tenosynovitis.  Extensive subcutaneous edema circumferentially in distal calf.  Dorsal subcutaneous edema in foot. S/p 2nd ray amputation, I&D, and bone bx of 3rd metatarsal on 11/1 --ID consulted --Continue Unasyn x 4 weeks until 12/09/20 - followed by PO augmentin until 12/23/20 --NWB LLE --Continue wound VAC --ID followup as outpatient   Acute Hypoxic Respiratory Failure Aspiration Pneumonia, treated Pulm edema 2/2 fluid overload w/ hx of recurrent flash pulm edema CXR 11/3 with right lung opacity concerning for superimposed infection - also with findings concerning for pulmonary edema and small right effusion Aspiration PNA considered, treated with IV Unasyn (for foot wound) Rapid response called early morning 11/10 for respiratory distress.   CXR showed pulm edema (pt had received MIVF for 4 days before I d/c'ed it yesterday).   Started on BiPAP, and given IV lasix 40 x1, 80 x1, metolazone x1 --Continue supplemental O2 to keep sats >=90%, wean as tolerated --BiPAP nightly for likely OSA -- Diuretic resumed: Bumex 4 mg daily per nephrology   Atrial Fibrillation with RVR - developed while meds were held for NPO status and procedures Cardiology increased rate control agents, now HR controlled.   Lopressor has been held half of the time due to low HR, discontinued. --Continue Cardizem 240 mg daily --d/c Lopressor  --hold Eliquis due to GI bleed   Hypertension - BP's now elevated. --Continue Cardizem 240 mg  daily --Lopressor d/c'd due to bradycardia --Continue hydralazine 50 mg q8h --Resumed home clonidine nightly (11/17) --PRN IV hydralazine for SBP>180 --allow BP to run on the high side due to GI bleed   Thrombocytopenia - HIT Ab negative.  Likely due to chronic liver disease. Platelets have been stable in the 80s to 90s. --monitor CBC   Dysphagia - 2/2 stroke --SLP following, seen on 11/12 --on Dysphagia 3 diet, thin liquid   AKI on CKD IV - Baseline creatinine around 3.2.   CKD secondary to diabetes.  AKI secondary to ATN. --Nephrology following --hold home lisinopril  --Resumed on Bumex 4 mg daily (11/19)   Elevated Troponin - due to demand ischemia.  --Cardiology following   Hx NASH cirrhosis -overall compensated at this time, but monitor closely for third spacing and edema.  Appears to be on Bumex 4 mg twice daily at home, without spironolactone. 11/17: Patient with significant weight gain with diuretics on hold (15 pounds in last 10 days) --Single dose IV Lasix with albumin given 11/17 11/18: Placed on 40 mg oral Lasix by cardiology 11/19: Resumed on home Bumex 1 mg daily  -- Resumed on Bumex, dosing per nephrology  --Strict I/O's Daily weights -monitor volume status closely   Hypothyroidism - Synthroid   Type 2 diabetes -controlled, last A1c 6.3%. Glucose has been inpatient goal or lower --no need for CBGs and SSI --Monitor fasting glucose with  BMP  Obesity: Body mass index is 33.45 kg/m.  Complicates overall care and prognosis.  Recommend lifestyle modifications including physical activity and diet for weight loss and overall long-term health.   DVT prophylaxis: Place and maintain sequential compression device Start: 11/15/20 1527   Diet:  Diet Orders (From admission, onward)     Start     Ordered   11/29/20 1751  DIET DYS 3 Room service appropriate? Yes; Fluid consistency: Thin  Diet effective now       Question Answer Comment  Room service appropriate?  Yes   Fluid consistency: Thin      11/29/20 1756              Code Status: Full Code   Subjective 11/30/20    Patient awake resting in bed when seen today.  She reports overall feeling well but just feels cold.  Denies feeling sick or having fevers, cough, sore throat, chest pain shortness of breath or nausea vomiting.  States she would like to get out of bed more and be up in chair.  Concern about getting more weak.   Disposition Plan & Communication   Status is: Inpatient  Remains inpatient appropriate because: Severity of illness  Dispo: SNF placement pending.     Consults, Procedures, Significant Events   Consultants:  Gastroenterology Nephrology Infectious disease Cardiology Podiatry Palliative care Neurology  Procedures:  Left foot second ray amputation 11/1 EGD 11/16  Antimicrobials:  Anti-infectives (From admission, onward)    Start     Dose/Rate Route Frequency Ordered Stop   11/30/20 1400  fluconazole (DIFLUCAN) IVPB 200 mg        200 mg 100 mL/hr over 60 Minutes Intravenous Every 24 hours 11/29/20 1633 12/13/20 1359   11/29/20 1800  fluconazole (DIFLUCAN) IVPB 400 mg        400 mg 100 mL/hr over 120 Minutes Intravenous  Once 11/29/20 1633 11/30/20 0724   11/29/20 1700  fluconazole (DIFLUCAN) IVPB 200 mg  Status:  Discontinued        200 mg 100 mL/hr over 60 Minutes Intravenous Every 24 hours 11/29/20 1611 11/29/20 1633   11/23/20 0000  ceFAZolin (ANCEF) IVPB 1 g/50 mL premix       Note to Pharmacy: To be given in specials   1 g 100 mL/hr over 30 Minutes Intravenous  Once 11/22/20 1106 11/22/20 1750   11/14/20 2200  Ampicillin-Sulbactam (UNASYN) 3 g in sodium chloride 0.9 % 100 mL IVPB        3 g 200 mL/hr over 30 Minutes Intravenous Every 12 hours 11/14/20 1610     11/14/20 0900  piperacillin-tazobactam (ZOSYN) IVPB 2.25 g  Status:  Discontinued        2.25 g 100 mL/hr over 30 Minutes Intravenous Every 8 hours 11/14/20 0805 11/14/20 1609    11/13/20 2200  piperacillin-tazobactam (ZOSYN) IVPB 3.375 g  Status:  Discontinued        3.375 g 12.5 mL/hr over 240 Minutes Intravenous Every 12 hours 11/13/20 0758 11/14/20 0805   11/13/20 0600  piperacillin-tazobactam (ZOSYN) IVPB 2.25 g        2.25 g 100 mL/hr over 30 Minutes Intravenous Every 8 hours 11/12/20 2155 11/13/20 1725   11/11/20 1600  vancomycin (VANCOCIN) IVPB 1000 mg/200 mL premix  Status:  Discontinued        1,000 mg 200 mL/hr over 60 Minutes Intravenous Every 48 hours 11/09/20 1618 11/10/20 1045   11/11/20 1558  vancomycin (VANCOCIN) powder  Status:  Discontinued          As needed 11/11/20 1558 11/11/20 1558   11/10/20 2000  vancomycin (VANCOCIN) IVPB 1000 mg/200 mL premix  Status:  Discontinued        1,000 mg 200 mL/hr over 60 Minutes Intravenous Every 48 hours 11/10/20 1918 11/12/20 0940   11/09/20 1800  ceFEPIme (MAXIPIME) 2 g in sodium chloride 0.9 % 100 mL IVPB  Status:  Discontinued        2 g 200 mL/hr over 30 Minutes Intravenous Every 24 hours 11/09/20 1619 11/12/20 2146   11/09/20 1619  vancomycin variable dose per unstable renal function (pharmacist dosing)  Status:  Discontinued         Does not apply See admin instructions 11/09/20 1619 11/12/20 1521   11/09/20 1615  vancomycin (VANCOREADY) IVPB 1750 mg/350 mL        1,750 mg 175 mL/hr over 120 Minutes Intravenous  Once 11/09/20 1606 11/09/20 1947   11/09/20 1600  metroNIDAZOLE (FLAGYL) IVPB 500 mg  Status:  Discontinued        500 mg 100 mL/hr over 60 Minutes Intravenous Every 8 hours 11/09/20 1555 11/12/20 2146         Micro    Objective   Vitals:   11/30/20 0507 11/30/20 0723 11/30/20 0733 11/30/20 1121  BP: (!) 142/89  128/69 137/81  Pulse: 77  64 90  Resp: 16  19 17   Temp: 98.5 F (36.9 C)  97.6 F (36.4 C) 97.9 F (36.6 C)  TempSrc: Axillary   Oral  SpO2: 100% 99% 100% 96%  Weight: 94 kg     Height:        Intake/Output Summary (Last 24 hours) at 11/30/2020 1433 Last data  filed at 11/30/2020 1121 Gross per 24 hour  Intake 2015.42 ml  Output 850 ml  Net 1165.42 ml   Filed Weights   11/28/20 0259 11/29/20 0536 11/30/20 0507  Weight: 98.4 kg 100.6 kg 94 kg    Physical Exam:  General exam: awake laying in bed, alert, no acute distress, obese Respiratory system: Lungs clear, normal respiratory effort on room air. Cardiovascular system: normal S1/S2, RRR, trace lower extremity edema bilaterally Central nervous system: A&O x3. no gross focal neurologic deficits, stable dysarthric speech Extremities: Left foot wound VAC in place Psychiatry: normal mood, congruent affect, judgement and insight appear normal  Labs   Data Reviewed: I have personally reviewed following labs and imaging studies  CBC: Recent Labs  Lab 11/26/20 1524 11/27/20 0501 11/28/20 0556 11/29/20 0538 11/30/20 0349  WBC 12.4* 8.4 7.5 6.2 4.4  HGB 9.3* 8.2* 9.1* 8.6* 8.3*  HCT 29.2* 26.8* 29.2* 27.4* 27.0*  MCV 94.5 96.1 97.0 95.5 96.8  PLT 96* 79* 80* 71* 64*   Basic Metabolic Panel: Recent Labs  Lab 11/26/20 0057 11/27/20 0501 11/28/20 0556 11/29/20 0538 11/30/20 0349  NA 141 140 141 137 140  K 4.2 4.1 4.2 3.7 3.7  CL 105 107 109 104 107  CO2 25 25 22 23 24   GLUCOSE 103* 90 91 95 94  BUN 63* 55* 48* 45* 44*  CREATININE 3.88* 3.66* 3.53* 3.52* 3.46*  CALCIUM 8.5* 8.5* 8.2* 8.2* 8.3*  MG 1.9 1.8 1.9 1.8 2.0   GFR: Estimated Creatinine Clearance: 18 mL/min (A) (by C-G formula based on SCr of 3.46 mg/dL (H)). Liver Function Tests: No results for input(s): AST, ALT, ALKPHOS, BILITOT, PROT, ALBUMIN in the last 168 hours. No results for input(s): LIPASE, AMYLASE in the  last 168 hours. No results for input(s): AMMONIA in the last 168 hours. Coagulation Profile: No results for input(s): INR, PROTIME in the last 168 hours.  Cardiac Enzymes: No results for input(s): CKTOTAL, CKMB, CKMBINDEX, TROPONINI in the last 168 hours. BNP (last 3 results) No results for input(s):  PROBNP in the last 8760 hours. HbA1C: No results for input(s): HGBA1C in the last 72 hours. CBG: Recent Labs  Lab 11/24/20 0825 11/24/20 1304 11/27/20 1054 11/29/20 1524 11/29/20 2140  GLUCAP 107* 132* 98 98 105*   Lipid Profile: No results for input(s): CHOL, HDL, LDLCALC, TRIG, CHOLHDL, LDLDIRECT in the last 72 hours. Thyroid Function Tests: No results for input(s): TSH, T4TOTAL, FREET4, T3FREE, THYROIDAB in the last 72 hours. Anemia Panel: No results for input(s): VITAMINB12, FOLATE, FERRITIN, TIBC, IRON, RETICCTPCT in the last 72 hours. Sepsis Labs: No results for input(s): PROCALCITON, LATICACIDVEN in the last 168 hours.   Recent Results (from the past 240 hour(s))  KOH prep     Status: None   Collection Time: 11/27/20 11:40 AM   Specimen: Esophagus  Result Value Ref Range Status   Specimen Description ESOPHAGUS  Final   Special Requests Normal  Final   KOH Prep   Final    YEAST PRESENT Performed at [facility], [address], [place], [place] [address]    Report Status 11/27/2020 FINAL  Final      Imaging Studies   No results found.   Medications   Scheduled Meds:  sodium chloride   Intravenous Once   allopurinol  50 mg Oral QODAY   vitamin C  500 mg Oral BID   atorvastatin  40 mg Oral Daily   budesonide (PULMICORT) nebulizer solution  0.5 mg Nebulization BID   bumetanide  4 mg Oral Daily   cloNIDine  0.3 mg Oral QHS   diltiazem  240 mg Oral Daily   diltiazem  10 mg Intravenous Once   docusate  100 mg Oral BID   ferrous gluconate  324 mg Oral Q breakfast   hydrALAZINE  50 mg Oral Q8H   levothyroxine  100 mcg Oral Q0600   mouth rinse  15 mL Mouth Rinse BID   metoprolol tartrate  50 mg Oral BID   multivitamin with minerals  1 tablet Oral Daily   nutrition supplement (JUVEN)  1 packet Oral BID BM   pantoprazole (PROTONIX) IV  40 mg Intravenous Q12H   polyethylene glycol  17 g Oral Daily   Ensure Max Protein  11 oz Oral QHS   sodium  chloride flush  3 mL Intravenous Q12H   sodium chloride flush  3 mL Intravenous Q12H   venlafaxine  50 mg Oral BID   Continuous Infusions:  sodium chloride 10 mL/hr at 11/27/20 1022   sodium chloride 250 mL (11/26/20 0942)   sodium chloride 250 mL (11/25/20 1208)   ampicillin-sulbactam (UNASYN) IV 3 g (11/30/20 1008)   fluconazole (DIFLUCAN) IV 200 mg (11/30/20 1428)       LOS: 21 days    Time spent: 25 minutes with > 50% spent at bedside and in coordination of care     Ezekiel Slocumb, DO [facility]  11/30/2020, 2:33 PM      If 7PM-7AM, please contact night-coverage. How to contact the [facility] Attending or Consulting provider [place] or covering provider during after hours [place], for this patient?    Check the care team in [facility] and look for a) attending/consulting [facility] provider  listed and b) the [facility] team listed Log into [email or web address] and use [place]'s universal password to access. If you do not have the password, please contact the hospital operator. Locate the [facility] provider you are looking for under [facility] and page to a number that you can be directly reached. If you still have difficulty reaching the provider, please page the [facility] (Director on Call) for the Hospitalists listed on amion for assistance.

## 2020-11-30 NOTE — Progress Notes (Addendum)
Physical Therapy Treatment Patient Details Name: Autumn [name] MRN: [id number] DOB: 06-07-1950 Today's Date: 11/30/2020   History of Present Illness 70 yo F admitted 10/29 s/p falls at home, worsening left foot chronic diabetic ulcer with osteomyelitis 2nd+3rd metatarsals and cellulitis noted on MRI, s/p L foot amputation of the 2nd metatarsal and toe, I&D deep abscess multiple fascial planes, and bone biopsy open deep third metatarsal on 10/31. Other comorbidities include sepsis without shock secondary to osteomyelitis and cellulitis left foot (IV zosyn), AKI on CKD, rapid afib (Cardizem + Heparin gtt), NSTEMI suspect demand ischemia, anemia acute on chronic. Transferred to ICU and intubated 11/2 after evolving neuro changes with unresponsiveness after coughing episode. 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    PT Comments    Pt is making limited progress towards goals, takes encouragement to participate with low engagement. Pt complains of being cold when covers removed for there-ex. During middle of there-ex, staff enter room to give bath. Offered to transfer to recliner due to staff availability, however nursing didn't want to bathe in recliner. Decreasing frequency this date due to limited progress made. Encouraged pt to attempt transfers next session, pt agreeable. Due to WB restrictions, will likely need +2 max assist for transfers. Would be beneficial for patient to remain engaged and continue HEP in between therapy sessions. Will continue to progress.    Recommendations for follow up therapy are one component of a multi-disciplinary discharge planning process, led by the attending physician.  Recommendations may be updated based on patient status, additional functional criteria and insurance authorization.  Follow Up Recommendations  Skilled nursing-short term rehab (<3  hours/day)     Assistance Recommended at Discharge Frequent or constant Supervision/Assistance  Equipment Recommendations   (TBD)    Recommendations for Other Services       Precautions / Restrictions Precautions Precautions: Fall Restrictions Weight Bearing Restrictions: Yes LLE Weight Bearing: Non weight bearing Other Position/Activity Restrictions: wound vac     Mobility  Bed Mobility               General bed mobility comments: deferred due to nursing activity    Transfers                        Ambulation/Gait                   Stairs             Wheelchair Mobility    Modified Rankin (Stroke Patients Only)       Balance                                            Cognition Arousal/Alertness: Awake/alert Behavior During Therapy: WFL for tasks assessed/performed Overall Cognitive Status: Within Functional Limits for tasks assessed                                          Exercises Other Exercises Other Exercises: educated on HEP and encouraged to continue performance. Does become SOB with exertion. B LE SLRs, SAQ, hip add squezes, and QS. 12 reps performed    General Comments  Pertinent Vitals/Pain Pain Assessment: No/denies pain    Home Living                          Prior Function            PT Goals (current goals can now be found in the care plan section) Acute Rehab PT Goals Patient Stated Goal: none stated PT Goal Formulation: With patient Time For Goal Achievement: 12/12/20 Potential to Achieve Goals: Fair Progress towards PT goals: Progressing toward goals    Frequency    Min 2X/week      PT Plan Frequency needs to be updated    Co-evaluation              AM-PAC PT "6 Clicks" Mobility   Outcome Measure  Help needed turning from your back to your side while in a flat bed without using bedrails?: A Little Help needed moving from  lying on your back to sitting on the side of a flat bed without using bedrails?: A Little Help needed moving to and from a bed to a chair (including a wheelchair)?: A Lot Help needed standing up from a chair using your arms (e.g., wheelchair or bedside chair)?: Total Help needed to walk in hospital room?: Total Help needed climbing 3-5 steps with a railing? : Total 6 Click Score: 11    End of Session   Activity Tolerance: Patient tolerated treatment well Patient left: in bed;with nursing/sitter in room Nurse Communication: Mobility status PT Visit Diagnosis: Other abnormalities of gait and mobility (R26.89);Hemiplegia and hemiparesis;Muscle weakness (generalized) (M62.81);History of falling (Z91.81) Hemiplegia - Right/Left: Right Hemiplegia - dominant/non-dominant: Dominant     Time: 9024-0973 PT Time Calculation (min) (ACUTE ONLY): 16 min  Charges:  $Therapeutic Exercise: 8-22 mins                     Greggory Stallion, PT, DPT [phone]    Carlise Stofer 11/30/2020, 3:51 PM

## 2020-11-30 NOTE — Progress Notes (Signed)
[facility]  PROGRESS NOTE   Subjective:   Colonoscopy was done yesterday. Poor prep.   UOP 1579mL  Creatinine 3.46 (3.52)  Patient sitting up in bed eating breakfast. She has no complaints.   Objective:  Vital signs in last 24 hours:  Temp:  [96.4 F (35.8 C)-98.5 F (36.9 C)] 97.6 F (36.4 C) (11/19 0733) Pulse Rate:  [64-92] 64 (11/19 0733) Resp:  [16-20] 19 (11/19 0733) BP: (114-152)/(66-89) 128/69 (11/19 0733) SpO2:  [94 %-100 %] 100 % (11/19 0733) FiO2 (%):  [28 %] 28 % (11/19 0723) Weight:  [94 kg] 94 kg (11/19 0507)  Weight change: -6.6 kg Filed Weights   11/28/20 0259 11/29/20 0536 11/30/20 0507  Weight: 98.4 kg 100.6 kg 94 kg    Intake/Output: I/O last 3 completed shifts: In: 2113.9 [P.O.:50; I.V.:1765.4; IV Piggyback:298.5] Out: 1800 [Urine:1800]   Intake/Output this shift:  Total I/O In: -  Out: 250 [Urine:250]  Physical Exam: General:  No acute distress, sitting on the side of her bed  Head:  Normocephalic, atraumatic. Moist oral mucosal membranes  Eyes:  Anicteric  Neck:  Supple  Lungs:   Clear to auscultation, normal effort  Heart:  irregular  Abdomen:   Soft, nontender, bowel sounds present  Extremities:  Trace Left lower extremity peripheral edema. +wound vac to left foot.   Neurologic:  Awake, alert, following commands  Skin:  No lesions       Basic Metabolic Panel: Recent Labs  Lab 11/26/20 0057 11/27/20 0501 11/28/20 0556 11/29/20 0538 11/30/20 0349  NA 141 140 141 137 140  K 4.2 4.1 4.2 3.7 3.7  CL 105 107 109 104 107  CO2 25 25 22 23 24   GLUCOSE 103* 90 91 95 94  BUN 63* 55* 48* 45* 44*  CREATININE 3.88* 3.66* 3.53* 3.52* 3.46*  CALCIUM 8.5* 8.5* 8.2* 8.2* 8.3*  MG 1.9 1.8 1.9 1.8 2.0     CBC: Recent Labs  Lab 11/26/20 1524 11/27/20 0501 11/28/20 0556 11/29/20 0538 11/30/20 0349  WBC 12.4* 8.4 7.5 6.2 4.4  HGB 9.3* 8.2* 9.1* 8.6* 8.3*  HCT 29.2* 26.8* 29.2* 27.4* 27.0*  MCV 94.5 96.1 97.0 95.5  96.8  PLT 96* 79* 80* 71* 64*      Urinalysis: No results for input(s): COLORURINE, LABSPEC, PHURINE, GLUCOSEU, HGBUR, BILIRUBINUR, KETONESUR, PROTEINUR, UROBILINOGEN, NITRITE, LEUKOCYTESUR in the last 72 hours.  Invalid input(s): APPERANCEUR    Imaging: No results found.   Medications:    sodium chloride 10 mL/hr at 11/27/20 1022   sodium chloride 250 mL (11/26/20 0942)   sodium chloride 250 mL (11/25/20 1208)   ampicillin-sulbactam (UNASYN) IV 3 g (11/30/20 1008)   fluconazole (DIFLUCAN) IV      sodium chloride   Intravenous Once   allopurinol  50 mg Oral QODAY   vitamin C  500 mg Oral BID   atorvastatin  40 mg Oral Daily   budesonide (PULMICORT) nebulizer solution  0.5 mg Nebulization BID   cloNIDine  0.3 mg Oral QHS   diltiazem  240 mg Oral Daily   diltiazem  10 mg Intravenous Once   docusate  100 mg Oral BID   ferrous gluconate  324 mg Oral Q breakfast   furosemide  40 mg Oral Daily   hydrALAZINE  50 mg Oral Q8H   levothyroxine  100 mcg Oral Q0600   mouth rinse  15 mL Mouth Rinse BID   metoprolol tartrate  50 mg Oral BID   multivitamin with  minerals  1 tablet Oral Daily   nutrition supplement (JUVEN)  1 packet Oral BID BM   pantoprazole (PROTONIX) IV  40 mg Intravenous Q12H   polyethylene glycol  17 g Oral Daily   Ensure Max Protein  11 oz Oral QHS   sodium chloride flush  3 mL Intravenous Q12H   sodium chloride flush  3 mL Intravenous Q12H   venlafaxine  50 mg Oral BID    Assessment/ Plan:     Principal Problem:   Sepsis ([place]) Active Problems:   Atrial fibrillation with RVR (HCC)   Osteomyelitis of foot, left, acute (HCC)   CKD stage 4 due to type 2 diabetes mellitus (HCC)   Liver cirrhosis secondary to NASH (nonalcoholic steatohepatitis) (HCC)   Hypertension   History of anemia due to CKD   Frequent falls   AKI (acute kidney injury) ([place])   Cellulitis and abscess of foot, except toes   Infectious tenosynovitis   Wheeze   Atrial fibrillation with  rapid ventricular response (HCC)   Bilateral carotid artery stenosis  Ms. Autumn [name] is a 70 y.o. white female with hypertension, diabetes mellitus type II, peripheral vascular disease, sleep apnea, atrial fibrillation who is admitted to [facility] on 11/09/2020 for Atrial fibrillation with rapid ventricular response ([place]) [I48.91] AKI (acute kidney injury) ([place]) [N17.9] Osteomyelitis of foot, left, acute (HCC) [X21.194] Atrial fibrillation with RVR ([place]) [I48.91] Worsening renal function [N28.9] Syncope, unspecified syncope type [R55]  Hospital course complicated by acute kidney injury and requiring amputation on 10/29 by Dr. Sherryle Lis. Patient with atrial fibrillation and acute CVA this admission.   #1: Acute kidney injury on chronic kidney disease stage IV with proteinuria: baseline creatinine of 3.19, GFR of 15 on 09/27/2020. Chronic kidney disease secondary to diabetes. Followed by Dr. Radene Knee, [facility].  Acute kidney injury secondary to ATN - Holding lisinopril and bumetanide. 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 Now with wound vac and IV unasyn.    #3: Anemia with chronic kidney disease: status post PRBC transfusions. Appreciate GI input. EGD with no source of bleed yesterday. Scheduled for colonoscopy today.    #4: Diabetes mellitus type II with chronic kidney disease: insulin dependent. Hemoglobin A1c of 6.3%.  - Continue glucose control  #5: Hypertension: 128/69. Home regimen also includes clonidine, lisinopril and bumetanide.  - Continue diltiazem, hydralazine, and metoprolol - bumex as above.    #6: Gout:  - allopurinol appropriately dosed    LOS: [place], MD [facility] 11/19/202210:18 AM

## 2020-11-30 NOTE — Anesthesia Postprocedure Evaluation (Signed)
Anesthesia Post Note  Patient: AYAUNA MCNAY  Procedure(s) Performed: COLONOSCOPY WITH PROPOFOL  Patient location during evaluation: Endoscopy Anesthesia Type: General Level of consciousness: awake and alert Pain management: pain level controlled Vital Signs Assessment: post-procedure vital signs reviewed and stable Respiratory status: spontaneous breathing, nonlabored ventilation, respiratory function stable and patient connected to nasal cannula oxygen Cardiovascular status: blood pressure returned to baseline and stable Postop Assessment: no apparent nausea or vomiting Anesthetic complications: no   No notable events documented.   Last Vitals:  Vitals:   11/30/20 0723 11/30/20 0733  BP:  128/69  Pulse:  64  Resp:  19  Temp:  36.4 C  SpO2: 99% 100%    Last Pain:  Vitals:   11/30/20 0507  TempSrc: Axillary  PainSc:                  [name]

## 2020-12-01 DIAGNOSIS — K7581 Nonalcoholic steatohepatitis (NASH): Secondary | ICD-10-CM | POA: Diagnosis not present

## 2020-12-01 DIAGNOSIS — M86172 Other acute osteomyelitis, left ankle and foot: Secondary | ICD-10-CM | POA: Diagnosis not present

## 2020-12-01 DIAGNOSIS — E1122 Type 2 diabetes mellitus with diabetic chronic kidney disease: Secondary | ICD-10-CM | POA: Diagnosis not present

## 2020-12-01 DIAGNOSIS — N189 Chronic kidney disease, unspecified: Secondary | ICD-10-CM | POA: Diagnosis not present

## 2020-12-01 NOTE — Progress Notes (Signed)
[facility]  PROGRESS NOTE   Subjective:   Patient sitting up in bed eating breakfast. She has no complaints.   Patient worked with physical therapy yesterday.   Objective:  Vital signs in last 24 hours:  Temp:  [97.5 F (36.4 C)-98.7 F (37.1 C)] 98.7 F (37.1 C) (11/20 1129) Pulse Rate:  [70-92] 80 (11/20 1129) Resp:  [18-20] 20 (11/20 1129) BP: (112-156)/(79-86) 146/82 (11/20 1129) SpO2:  [94 %-100 %] 98 % (11/20 1129) FiO2 (%):  [28 %] 28 % (11/20 0711) Weight:  [94.3 kg] 94.3 kg (11/20 0500)  Weight change: 0.3 kg Filed Weights   11/29/20 0536 11/30/20 0507 12/01/20 0500  Weight: 100.6 kg 94 kg 94.3 kg    Intake/Output: I/O last 3 completed shifts: In: 775.1 [P.O.:60; I.V.:462.5; IV Piggyback:252.6] Out: 2450 [Urine:2450]   Intake/Output this shift:  Total I/O In: 100 [IV Piggyback:100] Out: -   Physical Exam: General:  No acute distress, sitting on the side of her bed  Head:  Normocephalic, atraumatic. Moist oral mucosal membranes  Eyes:  Anicteric  Neck:  Supple  Lungs:   Clear to auscultation, normal effort  Heart:  irregular  Abdomen:   Soft, nontender, bowel sounds present  Extremities:  Trace Left lower extremity peripheral edema. +wound vac to left foot.   Neurologic:  Awake, alert, following commands  Skin:  No lesions       Basic Metabolic Panel: Recent Labs  Lab 11/26/20 0057 11/27/20 0501 11/28/20 0556 11/29/20 0538 11/30/20 0349  NA 141 140 141 137 140  K 4.2 4.1 4.2 3.7 3.7  CL 105 107 109 104 107  CO2 25 25 22 23 24   GLUCOSE 103* 90 91 95 94  BUN 63* 55* 48* 45* 44*  CREATININE 3.88* 3.66* 3.53* 3.52* 3.46*  CALCIUM 8.5* 8.5* 8.2* 8.2* 8.3*  MG 1.9 1.8 1.9 1.8 2.0     CBC: Recent Labs  Lab 11/26/20 1524 11/27/20 0501 11/28/20 0556 11/29/20 0538 11/30/20 0349  WBC 12.4* 8.4 7.5 6.2 4.4  HGB 9.3* 8.2* 9.1* 8.6* 8.3*  HCT 29.2* 26.8* 29.2* 27.4* 27.0*  MCV 94.5 96.1 97.0 95.5 96.8  PLT 96* 79* 80* 71*  64*      Urinalysis: No results for input(s): COLORURINE, LABSPEC, PHURINE, GLUCOSEU, HGBUR, BILIRUBINUR, KETONESUR, PROTEINUR, UROBILINOGEN, NITRITE, LEUKOCYTESUR in the last 72 hours.  Invalid input(s): APPERANCEUR    Imaging: No results found.   Medications:    sodium chloride 10 mL/hr at 11/30/20 2310   sodium chloride 250 mL (11/26/20 0942)   sodium chloride 250 mL (11/25/20 1208)   ampicillin-sulbactam (UNASYN) IV 3 g (12/01/20 0930)   fluconazole (DIFLUCAN) IV Stopped (11/30/20 1539)    sodium chloride   Intravenous Once   allopurinol  50 mg Oral QODAY   vitamin C  500 mg Oral BID   atorvastatin  40 mg Oral Daily   budesonide (PULMICORT) nebulizer solution  0.5 mg Nebulization BID   bumetanide  4 mg Oral Daily   cloNIDine  0.3 mg Oral QHS   diltiazem  240 mg Oral Daily   diltiazem  10 mg Intravenous Once   docusate  100 mg Oral BID   ferrous gluconate  324 mg Oral Q breakfast   hydrALAZINE  50 mg Oral Q8H   levothyroxine  100 mcg Oral Q0600   mouth rinse  15 mL Mouth Rinse BID   metoprolol tartrate  50 mg Oral BID   multivitamin with minerals  1 tablet Oral  Daily   nutrition supplement (JUVEN)  1 packet Oral BID BM   pantoprazole (PROTONIX) IV  40 mg Intravenous Q12H   polyethylene glycol  17 g Oral Daily   Ensure Max Protein  11 oz Oral QHS   sodium chloride flush  3 mL Intravenous Q12H   sodium chloride flush  3 mL Intravenous Q12H   venlafaxine  50 mg Oral BID    Assessment/ Plan:     Principal Problem:   Sepsis ([place]) Active Problems:   Atrial fibrillation with RVR (HCC)   Osteomyelitis of foot, left, acute (HCC)   CKD stage 4 due to type 2 diabetes mellitus (HCC)   Liver cirrhosis secondary to NASH (nonalcoholic steatohepatitis) (HCC)   Hypertension   History of anemia due to CKD   Frequent falls   AKI (acute kidney injury) ([place])   Cellulitis and abscess of foot, except toes   Infectious tenosynovitis   Wheeze   Atrial fibrillation with rapid  ventricular response (HCC)   Bilateral carotid artery stenosis  Ms. LARINA LIEURANCE is a 70 y.o. white female with hypertension, diabetes mellitus type II, peripheral vascular disease, sleep apnea, atrial fibrillation who is admitted to [facility] on 11/09/2020 for Atrial fibrillation with rapid ventricular response ([place]) [I48.91] AKI (acute kidney injury) ([place]) [N17.9] Osteomyelitis of foot, left, acute (HCC) [Y51.102] Atrial fibrillation with RVR ([place]) [I48.91] Worsening renal function [N28.9] Syncope, unspecified syncope type [R55]  Hospital course complicated by acute kidney injury and requiring amputation on 10/29 by Dr. Sherryle Lis. Patient with atrial fibrillation and acute CVA this admission.   #1: Acute kidney injury on chronic kidney disease stage IV with proteinuria: baseline creatinine of 3.19, GFR of 15 on 09/27/2020. Chronic kidney disease secondary to diabetes. Followed by Dr. Radene Knee, [facility].  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 Now with wound vac and IV unasyn and fluconazole    #3: Anemia with chronic kidney disease: status post PRBC transfusions. Appreciate GI input. Status post EGD and colonoscopy.    #4: Diabetes mellitus type II with chronic kidney disease: insulin dependent. Hemoglobin A1c of 6.3%.  - Continue glucose control  #5: Hypertension: 146/82. Home regimen also includes clonidine, lisinopril and bumetanide.  - Continue diltiazem (started this admission for atrial fibrillation), bumetanide, hydralazine, and metoprolol    LOS: [place], MD [facility] 11/20/20222:05 PM

## 2020-12-01 NOTE — Progress Notes (Signed)
Speech Language Pathology Treatment: Dysphagia (Motor-speech)  Patient Details Name: Autumn [name] MRN: [id number] DOB: Feb 13, 1950 Today's Date: 12/01/2020 Time: 8242-3536 SLP Time Calculation (min) (ACUTE ONLY): 33 min  Assessment / Plan / Recommendation Clinical Impression  Pt seen for diet tolerance. Noted pt's meal tray at bedside with straws in cups filled with thin liquids. Per pt, pt has been using straws without difficulty. Pt given trials of solid (x4 saltine crackers) and thin liquids (~8 oz via straw). Pt continues with s/sx oral dysphagia c/b need for increased time to masticate solids due to gumming/mashing pattern which is likely baseline from her dental status. Trace lingual residual noted with solid which was cleared with pt's self-initiated liquid wash. No overt or subtle s/sx pharyngeal dysphagia noted across trials.   Recommend continuation of mech soft diet with thin liquids and safe swallowing strategies as outlined below. Pt OK to utilize straws to consume thin liquids.   Pt seen for skilled speech therapy targeting utilization of compensatory strategies for speech intelligibility in setting of dysarthria. Reviewed speech intelligibility strategies of increased vocal loudness, slowed rate of speech, and overarticulation. Pt able to use strategies with min verbal cueing during structured tasks (e.g. repeating, automatic speech); however, requiring mod cues during informal conversational exchanges. Decreased carryover noted. Pt continues with dysarthria c/b intermittent fast rate of speech and imprecise articulation. Pt approx 75-80% intelligible to an unfamiliar listener during informal conversational exchanges which appears to be improved from previous SLP session. When asked about pt's goals for SLP services, pt stated "to work on my memory." Pt noted that she is having "some trouble remembering" when is new this admission. Given pt's overall improvement in mental status;  further diagnostic assessment warranted.  Pt's RN made aware of results of today's session, diet recommendations, safe swallowing strategies/aspiration precautions, and SLP POC. Pt educated at length re: diet recommendations, safe swallowing strategies/aspiration precautions, rationale for diet recommendations/safe swallowing strategies/aspiration precautions, dysarthria strategies, and SLP POC. Pt verbalized understanding/agreement; however, may require reinforcement of content by medical team.   SLP to f/u per POC for skilled SLP services targeting improved speech intelligibility and diagnostic assessment of functional cognitive-linguistic ability.      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  She'd been following with a [facility] podiatrist outpatient and had recently refused surgery.  She'd recently been treated with antibiotics at [facility] for the diabetic foot infection with enterobacter aerogans culture positive.  She was admitted on 10/29 due to falls at home with worsening L foot chronic diabetic ulcer with osteomyelitis of the 2nd and 3rd metatarsals and cellulitis noted on MRI.  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  Her post operative course was complicated by unresponsiveness and neurologic changes on 11/14/2066. She was found to have a stroke (MRI Brain showed acute to early subacute infarction in the left posterior frontal cortical and subcortical brain, possibly affecting the precentral gyrus) and was intubated for airway protection.  She was extubated 11/3 and transferred to [facility] service on 11/4.      SLP Plan  Continue with current plan of care;Goals updated      Recommendations for follow up therapy are one component of a multi-disciplinary discharge planning process, led by the attending physician.  Recommendations  may be updated based on patient status, additional functional criteria  and insurance authorization.    Recommendations  Diet recommendations: Dysphagia 3 (mechanical soft);Thin liquid Liquids provided via: Cup;Straw;Teaspoon Medication Administration: Whole meds with puree Supervision: Patient able to self feed;Staff to assist with self feeding (meal set-up) Compensations: Minimize environmental distractions;Slow rate;Small sips/bites;Follow solids with liquid Postural Changes and/or Swallow Maneuvers: Out of bed for meals;Seated upright 90 degrees;Upright 30-60 min after meal                Oral Care Recommendations: Oral care BID;Oral care before and after PO;Staff/trained caregiver to provide oral care Follow Up Recommendations: Acute inpatient rehab (3hours/day) Assistance recommended at discharge: Intermittent Supervision/Assistance SLP Visit Diagnosis: Dysphagia, oropharyngeal phase (R13.12);Dysarthria and anarthria (R47.1) Plan: Continue with current plan of care;Goals updated       Cherrie Gauze, M.S., [facility] [phone] ([place])                 Quintella Baton  12/01/2020, 1:30 PM

## 2020-12-01 NOTE — Progress Notes (Signed)
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  She'd been following with a [facility] podiatrist outpatient and had recently refused surgery.  She'd recently been treated with antibiotics at [facility] for the diabetic foot infection with enterobacter aerogans culture positive.  She was admitted on 10/29 due to falls at home with worsening L foot chronic diabetic ulcer with osteomyelitis of the 2nd and 3rd metatarsals and cellulitis noted on MRI.  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  Her post operative course was complicated by unresponsiveness and neurologic changes, she was found to have a stroke and was intubated for airway protection.  She was extubated 11/3 and transferred to [facility] service on 11/4.  Hospital course further complicated by acute on chronic anemia with melena after starting on anticoagulation, requiring blood transfusions and GI evaluation.    Assessment & Plan   Principal Problem:   Sepsis ([place]) Active Problems:   Atrial fibrillation with RVR (HCC)   Osteomyelitis of foot, left, acute (HCC)   CKD stage 4 due to type 2 diabetes mellitus (HCC)   Liver cirrhosis secondary to NASH (nonalcoholic steatohepatitis) (HCC)   Hypertension   History of anemia due to CKD   Frequent falls   AKI (acute kidney injury) ([place])   Cellulitis and abscess of foot, except toes   Infectious tenosynovitis   Wheeze   Atrial fibrillation with rapid ventricular response (HCC)   Bilateral carotid artery stenosis   Acute Blood Loss Anemia Iron deficiency Anemia of CKD Acute anemia occurred in setting of heparin and recent surgical procedure - postop bleeding from surgical  wound Received total 4 units pRBC's, most recently on 11/14. Hgb stable in 8's-9's  Labs c/w iron def, vit B12 elevated, folate wnl.   --Monitor Hgb, transfuse to keep Hgb >7 --Continue iron suppl --Hold Eliquis x 3 days post-Cscope (resume 11/22) --GI consult as below   GI bleed - with melena with blood clots seen 11/15.   Pt had been started on Eliquis for 2ndary stroke prevention due to embolic stroke this admission. EGD on  11/16 -white nummular lesions in the esophageal mucosa (yeast on brushings), gastritis, erythematous duodenopathy Colonoscopy on 11/18 -poor prep, multiple polyps removed --GI consulted - plan for colonoscopy today if adequate prep consumed --Hold Eliquis x 3 days after colonoscopy given polypectomy --IV PPI BID  Esophageal candidiasis - fluconazole 400 mg IV x 1 then 200 mg daily x 14-21 days (IV for now, switch to PO when diet resumed after scopes)     Stroke Expressive aphasia, improved Neurology suspects periprocedural vs related to atrial fibrillation MRI brian with acute to early subacute infarction in the L posterior frontal cortical and subcortical brain, possibly affecting the precentral gyrus.   Echo with EF 55-60%, dilated LA, patent foramen ovale Carotid US right ICA artery worrisome for a subtotal occlusion.  Moderate to large amount of L sided atheroscelotic plaque, however, Left carotid angiogram with Dr. Delana Meyer showed no significant stenosis. Cleared to resume Eliquis on 11/11 for 2ndary stroke prevention. --Eliquis now on hold due to GI bleed --Continue statin --Anticipate discharge to SNF (CIR recommended  but not covered by her insurance)   Acute Metabolic Encephalopathy - Related to acute infection and above --Delirium precautions --Follow and w/u further as indicated   Septic Shock Diabetic Foot Infection Left Foot Osteomyelitis and Cellulitis  Septic Arthritis MRI foot with septic arthritis of second MTP joint and osteomyelitis of  2nd metatarsal head and second proximal phalanx, severe soft tissue swelling around second metatarsal neck concerning for phlegmon developing abscess measuring 2.1x1 cm circumferentially around distal shaft.  Mild early osteomyelitis of 3rd metatarsal head.  Severe soft tissue edema of the forefoot c/w cellulitis. MRI ankle limited -> interval resection of 2nd metatarsal, longitudinal tear of peroneus brevis with peroneus tenosynovitis, distal tibialis posterior tenosynovitis and tendinopathy, extensor digitorum tenosynovitis.  Extensive subcutaneous edema circumferentially in distal calf.  Dorsal subcutaneous edema in foot. S/p 2nd ray amputation, I&D, and bone bx of 3rd metatarsal on 11/1 --ID consulted --Continue Unasyn x 4 weeks until 12/09/20 - followed by PO augmentin until 12/23/20 --NWB LLE --Continue wound VAC --ID followup as outpatient   Acute Hypoxic Respiratory Failure Aspiration Pneumonia, treated Pulm edema 2/2 fluid overload w/ hx of recurrent flash pulm edema CXR 11/3 with right lung opacity concerning for superimposed infection - also with findings concerning for pulmonary edema and small right effusion Aspiration PNA considered, treated with IV Unasyn (for foot wound) Rapid response called early morning 11/10 for respiratory distress.   CXR showed pulm edema (pt had received MIVF for 4 days before I d/c'ed it yesterday).   Started on BiPAP, and given IV lasix 40 x1, 80 x1, metolazone x1 --Continue supplemental O2 to keep sats >=90%, wean as tolerated --BiPAP nightly for likely OSA -- Diuretic resumed: Bumex 4 mg daily per nephrology   Atrial Fibrillation with RVR - developed while meds were held for NPO status and procedures Cardiology increased rate control agents, now HR controlled.   Lopressor has been held half of the time due to low HR, discontinued. --Continue Cardizem 240 mg daily --d/c Lopressor  --hold Eliquis due to GI bleed   Hypertension - BP's now  elevated. --Continue Cardizem 240 mg daily --Lopressor d/c'd due to bradycardia --Continue hydralazine 50 mg q8h --Resumed home clonidine nightly (11/17) --PRN IV hydralazine for SBP>180 --allow BP to run on the high side due to GI bleed   Thrombocytopenia - HIT Ab negative.  Likely due to chronic liver disease. Platelets have been stable in the 80s to 90s. --monitor CBC   Dysphagia - 2/2 stroke --SLP following, seen on 11/12 --on Dysphagia 3 diet, thin liquid   AKI on CKD IV - Baseline creatinine around 3.2.   CKD secondary to diabetes.  AKI secondary to ATN. --Nephrology following --hold home lisinopril  --Resumed on Bumex 4 mg daily (11/19)   Elevated Troponin - due to demand ischemia.  --Cardiology following   Hx NASH cirrhosis -overall compensated at this time, but monitor closely for third spacing and edema.  Appears to be on Bumex 4 mg twice daily at home, without spironolactone.   -- Resumed on Bumex, dosing per nephrology  --Strict I/O's Daily weights -monitor volume status closely   Hypothyroidism - Synthroid   Type 2 diabetes -controlled, last A1c 6.3%. Glucose has been inpatient goal or lower --no need for CBGs and SSI --Monitor fasting glucose with BMP  Obesity: Body mass index is 33.55 kg/m.  Complicates overall care and prognosis.  Recommend lifestyle modifications including physical activity and diet for weight loss and overall long-term health.   DVT prophylaxis:  Place and maintain sequential compression device Start: 11/15/20 1527   Diet:  Diet Orders (From admission, onward)     Start     Ordered   11/29/20 1751  DIET DYS 3 Room service appropriate? Yes; Fluid consistency: Thin  Diet effective now       Question Answer Comment  Room service appropriate? Yes   Fluid consistency: Thin      11/29/20 1756              Code Status: Full Code   Subjective 12/01/20    Patient was sleeping comfortably, woke to voice.  Denies any acute  complaints but says she's tired.  No acute events reported.  Wants to get out of bed more.   Disposition Plan & Communication   Status is: Inpatient  Remains inpatient appropriate because: Severity of illness  Dispo: SNF placement pending.     Consults, Procedures, Significant Events   Consultants:  Gastroenterology Nephrology Infectious disease Cardiology Podiatry Palliative care Neurology  Procedures:  Left foot second ray amputation 11/1 EGD 11/16  Antimicrobials:  Anti-infectives (From admission, onward)    Start     Dose/Rate Route Frequency Ordered Stop   11/30/20 1400  fluconazole (DIFLUCAN) IVPB 200 mg        200 mg 100 mL/hr over 60 Minutes Intravenous Every 24 hours 11/29/20 1633 12/13/20 1359   11/29/20 1800  fluconazole (DIFLUCAN) IVPB 400 mg        400 mg 100 mL/hr over 120 Minutes Intravenous  Once 11/29/20 1633 11/30/20 0724   11/29/20 1700  fluconazole (DIFLUCAN) IVPB 200 mg  Status:  Discontinued        200 mg 100 mL/hr over 60 Minutes Intravenous Every 24 hours 11/29/20 1611 11/29/20 1633   11/23/20 0000  ceFAZolin (ANCEF) IVPB 1 g/50 mL premix       Note to Pharmacy: To be given in specials   1 g 100 mL/hr over 30 Minutes Intravenous  Once 11/22/20 1106 11/22/20 1750   11/14/20 2200  Ampicillin-Sulbactam (UNASYN) 3 g in sodium chloride 0.9 % 100 mL IVPB        3 g 200 mL/hr over 30 Minutes Intravenous Every 12 hours 11/14/20 1610     11/14/20 0900  piperacillin-tazobactam (ZOSYN) IVPB 2.25 g  Status:  Discontinued        2.25 g 100 mL/hr over 30 Minutes Intravenous Every 8 hours 11/14/20 0805 11/14/20 1609   11/13/20 2200  piperacillin-tazobactam (ZOSYN) IVPB 3.375 g  Status:  Discontinued        3.375 g 12.5 mL/hr over 240 Minutes Intravenous Every 12 hours 11/13/20 0758 11/14/20 0805   11/13/20 0600  piperacillin-tazobactam (ZOSYN) IVPB 2.25 g        2.25 g 100 mL/hr over 30 Minutes Intravenous Every 8 hours 11/12/20 2155 11/13/20 1725    11/11/20 1600  vancomycin (VANCOCIN) IVPB 1000 mg/200 mL premix  Status:  Discontinued        1,000 mg 200 mL/hr over 60 Minutes Intravenous Every 48 hours 11/09/20 1618 11/10/20 1045   11/11/20 1558  vancomycin (VANCOCIN) powder  Status:  Discontinued          As needed 11/11/20 1558 11/11/20 1558   11/10/20 2000  vancomycin (VANCOCIN) IVPB 1000 mg/200 mL premix  Status:  Discontinued        1,000 mg 200 mL/hr over 60 Minutes Intravenous Every 48 hours 11/10/20 1918 11/12/20 0940   11/09/20 1800  ceFEPIme (MAXIPIME) 2 g  in sodium chloride 0.9 % 100 mL IVPB  Status:  Discontinued        2 g 200 mL/hr over 30 Minutes Intravenous Every 24 hours 11/09/20 1619 11/12/20 2146   11/09/20 1619  vancomycin variable dose per unstable renal function (pharmacist dosing)  Status:  Discontinued         Does not apply See admin instructions 11/09/20 1619 11/12/20 1521   11/09/20 1615  vancomycin (VANCOREADY) IVPB 1750 mg/350 mL        1,750 mg 175 mL/hr over 120 Minutes Intravenous  Once 11/09/20 1606 11/09/20 1947   11/09/20 1600  metroNIDAZOLE (FLAGYL) IVPB 500 mg  Status:  Discontinued        500 mg 100 mL/hr over 60 Minutes Intravenous Every 8 hours 11/09/20 1555 11/12/20 2146         Micro    Objective   Vitals:   12/01/20 0711 12/01/20 0742 12/01/20 1129 12/01/20 1639  BP:  (!) 147/86 (!) 146/82 (!) 153/85  Pulse:  78 80 68  Resp:  20 20 20   Temp:  98.7 F (37.1 C) 98.7 F (37.1 C) 98.3 F (36.8 C)  TempSrc:      SpO2: 97% 100% 98% 94%  Weight:      Height:        Intake/Output Summary (Last 24 hours) at 12/01/2020 1822 Last data filed at 12/01/2020 1505 Gross per 24 hour  Intake 160 ml  Output 2650 ml  Net -2490 ml   Filed Weights   11/29/20 0536 11/30/20 0507 12/01/20 0500  Weight: 100.6 kg 94 kg 94.3 kg    Physical Exam:  General exam: sleeping comfortably, woke to voice, no acute distress, obese Respiratory system: normal respiratory effort on room air, on room  air. Cardiovascular system: RRR, trace lower extremity edema bilaterally Central nervous system: A&O x3. no gross focal neurologic deficits, stable dysarthric speech Extremities: Left foot wound VAC in place Psychiatry: normal mood, congruent affect, judgement and insight appear normal  Labs   Data Reviewed: I have personally reviewed following labs and imaging studies  CBC: Recent Labs  Lab 11/26/20 1524 11/27/20 0501 11/28/20 0556 11/29/20 0538 11/30/20 0349  WBC 12.4* 8.4 7.5 6.2 4.4  HGB 9.3* 8.2* 9.1* 8.6* 8.3*  HCT 29.2* 26.8* 29.2* 27.4* 27.0*  MCV 94.5 96.1 97.0 95.5 96.8  PLT 96* 79* 80* 71* 64*   Basic Metabolic Panel: Recent Labs  Lab 11/26/20 0057 11/27/20 0501 11/28/20 0556 11/29/20 0538 11/30/20 0349  NA 141 140 141 137 140  K 4.2 4.1 4.2 3.7 3.7  CL 105 107 109 104 107  CO2 25 25 22 23 24   GLUCOSE 103* 90 91 95 94  BUN 63* 55* 48* 45* 44*  CREATININE 3.88* 3.66* 3.53* 3.52* 3.46*  CALCIUM 8.5* 8.5* 8.2* 8.2* 8.3*  MG 1.9 1.8 1.9 1.8 2.0   GFR: Estimated Creatinine Clearance: 18 mL/min (A) (by C-G formula based on SCr of 3.46 mg/dL (H)). Liver Function Tests: No results for input(s): AST, ALT, ALKPHOS, BILITOT, PROT, ALBUMIN in the last 168 hours. No results for input(s): LIPASE, AMYLASE in the last 168 hours. No results for input(s): AMMONIA in the last 168 hours. Coagulation Profile: No results for input(s): INR, PROTIME in the last 168 hours.  Cardiac Enzymes: No results for input(s): CKTOTAL, CKMB, CKMBINDEX, TROPONINI in the last 168 hours. BNP (last 3 results) No results for input(s): PROBNP in the last 8760 hours. HbA1C: No results for input(s): HGBA1C in  the last 72 hours. CBG: Recent Labs  Lab 11/27/20 1054 11/29/20 1524 11/29/20 2140  GLUCAP 98 98 105*   Lipid Profile: No results for input(s): CHOL, HDL, LDLCALC, TRIG, CHOLHDL, LDLDIRECT in the last 72 hours. Thyroid Function Tests: No results for input(s): TSH, T4TOTAL,  FREET4, T3FREE, THYROIDAB in the last 72 hours. Anemia Panel: No results for input(s): VITAMINB12, FOLATE, FERRITIN, TIBC, IRON, RETICCTPCT in the last 72 hours. Sepsis Labs: No results for input(s): PROCALCITON, LATICACIDVEN in the last 168 hours.   Recent Results (from the past 240 hour(s))  KOH prep     Status: None   Collection Time: 11/27/20 11:40 AM   Specimen: Esophagus  Result Value Ref Range Status   Specimen Description ESOPHAGUS  Final   Special Requests Normal  Final   KOH Prep   Final    YEAST PRESENT Performed at [facility], [address] [place], [place] [address]    Report Status 11/27/2020 FINAL  Final      Imaging Studies   No results found.   Medications   Scheduled Meds:  sodium chloride   Intravenous Once   allopurinol  50 mg Oral QODAY   vitamin C  500 mg Oral BID   atorvastatin  40 mg Oral Daily   budesonide (PULMICORT) nebulizer solution  0.5 mg Nebulization BID   bumetanide  4 mg Oral Daily   cloNIDine  0.3 mg Oral QHS   diltiazem  240 mg Oral Daily   diltiazem  10 mg Intravenous Once   docusate  100 mg Oral BID   ferrous gluconate  324 mg Oral Q breakfast   hydrALAZINE  50 mg Oral Q8H   levothyroxine  100 mcg Oral Q0600   mouth rinse  15 mL Mouth Rinse BID   metoprolol tartrate  50 mg Oral BID   multivitamin with minerals  1 tablet Oral Daily   nutrition supplement (JUVEN)  1 packet Oral BID BM   pantoprazole (PROTONIX) IV  40 mg Intravenous Q12H   polyethylene glycol  17 g Oral Daily   Ensure Max Protein  11 oz Oral QHS   sodium chloride flush  3 mL Intravenous Q12H   sodium chloride flush  3 mL Intravenous Q12H   venlafaxine  50 mg Oral BID   Continuous Infusions:  sodium chloride 10 mL/hr at 11/30/20 2310   sodium chloride 250 mL (11/26/20 0942)   sodium chloride 250 mL (11/25/20 1208)   ampicillin-sulbactam (UNASYN) IV 3 g (12/01/20 0930)   fluconazole (DIFLUCAN) IV 200 mg (12/01/20 1501)       LOS: 22 days     Time spent: 25 minutes with > 50% spent at bedside and in coordination of care     Ezekiel Slocumb, DO [facility]  12/01/2020, 6:22 PM      If 7PM-7AM, please contact night-coverage. How to contact the [facility] Attending or Consulting provider [place] or covering provider during after hours [place], for this patient?    Check the care team in [facility] and look for a) attending/consulting TRH provider listed and b) the [facility] team listed Log into [email or web address] and use [place]'s universal password to access. If you do not have the password, please contact the hospital operator. Locate the [facility] provider you are looking for under [facility] and page to a number that you can be directly reached. If you still have difficulty reaching the provider, please page the [facility] (Director on Call) for the  Hospitalists listed on amion for assistance.

## 2020-12-01 NOTE — Plan of Care (Signed)
  Problem: Health Behavior/Discharge Planning: Goal: Ability to manage health-related needs will improve Outcome: Progressing   Problem: Clinical Measurements: Goal: Ability to maintain clinical measurements within normal limits will improve Outcome: Progressing   

## 2020-12-01 NOTE — Plan of Care (Signed)
  Problem: Health Behavior/Discharge Planning: Goal: Ability to manage health-related needs will improve 12/01/2020 1437 by Emmaline Life, RN Outcome: Progressing 12/01/2020 1428 by Emmaline Life, RN Outcome: Progressing

## 2020-12-02 ENCOUNTER — Encounter: Payer: Self-pay | Admitting: Gastroenterology

## 2020-12-02 DIAGNOSIS — N189 Chronic kidney disease, unspecified: Secondary | ICD-10-CM | POA: Diagnosis not present

## 2020-12-02 DIAGNOSIS — L03119 Cellulitis of unspecified part of limb: Secondary | ICD-10-CM | POA: Diagnosis not present

## 2020-12-02 DIAGNOSIS — M86172 Other acute osteomyelitis, left ankle and foot: Secondary | ICD-10-CM | POA: Diagnosis not present

## 2020-12-02 DIAGNOSIS — E1122 Type 2 diabetes mellitus with diabetic chronic kidney disease: Secondary | ICD-10-CM | POA: Diagnosis not present

## 2020-12-02 LAB — BASIC METABOLIC PANEL
Anion gap: 10 (ref 5–15)
BUN: 49 mg/dL — ABNORMAL HIGH (ref 8–23)
CO2: 25 mmol/L (ref 22–32)
Calcium: 8.5 mg/dL — ABNORMAL LOW (ref 8.9–10.3)
Chloride: 104 mmol/L (ref 98–111)
Creatinine, Ser: 3.53 mg/dL — ABNORMAL HIGH (ref 0.44–1.00)
GFR, Estimated: 14 mL/min — ABNORMAL LOW (ref >60)
Glucose, Bld: 113 mg/dL — ABNORMAL HIGH (ref 70–99)
Potassium: 3.8 mmol/L (ref 3.5–5.1)
Sodium: 139 mmol/L (ref 135–145)

## 2020-12-02 LAB — CBC
HCT: 27.5 % — ABNORMAL LOW (ref 36.0–46.0)
Hemoglobin: 8.8 g/dL — ABNORMAL LOW (ref 12.0–15.0)
MCH: 30.1 pg (ref 26.0–34.0)
MCHC: 32 g/dL (ref 30.0–36.0)
MCV: 94.2 fL (ref 80.0–100.0)
Platelets: 70 10*3/uL — ABNORMAL LOW (ref 150–400)
RBC: 2.92 MIL/uL — ABNORMAL LOW (ref 3.87–5.11)
RDW: 18 % — ABNORMAL HIGH (ref 11.5–15.5)
WBC: 4 10*3/uL (ref 4.0–10.5)
nRBC: 0 % (ref 0.0–0.2)

## 2020-12-02 MED ORDER — DOCUSATE SODIUM 50 MG/5ML PO LIQD
100.0000 mg | Freq: Two times a day (BID) | ORAL | Status: DC | PRN
  Filled 2020-12-02: qty 10

## 2020-12-02 MED ORDER — APIXABAN 5 MG PO TABS
5.0000 mg | ORAL_TABLET | Freq: Two times a day (BID) | ORAL | Status: DC
  Administered 2020-12-02 – 2020-12-12 (×20): 5 mg via ORAL
  Filled 2020-12-02 (×20): qty 1

## 2020-12-02 MED ORDER — LOPERAMIDE HCL 2 MG PO CAPS
2.0000 mg | ORAL_CAPSULE | ORAL | Status: DC | PRN
  Administered 2020-12-02: 2 mg via ORAL
  Filled 2020-12-02: qty 1

## 2020-12-02 MED ORDER — HYDRALAZINE HCL 50 MG PO TABS
100.0000 mg | ORAL_TABLET | Freq: Three times a day (TID) | ORAL | Status: DC
  Administered 2020-12-02 – 2020-12-21 (×57): 100 mg via ORAL
  Filled 2020-12-02 (×57): qty 2

## 2020-12-02 MED ORDER — POLYETHYLENE GLYCOL 3350 17 G PO PACK: 17.0000 g | PACK | Freq: Every day | ORAL | Status: DC | PRN

## 2020-12-02 NOTE — TOC Progression Note (Signed)
Transition of Care [facility]) - Progression Note    Patient Details  Name: Autumn [name] MRN: [id number] Date of Birth: 06/03/1950  Transition of Care [facility]) CM/SW [place], [place] Phone Number: 12/02/2020, 4:02 PM  Clinical Narrative:     Mendel Corning reports they sent additional clinicals to insurance, still pending insurance auth at this time.    Expected Discharge Plan: [place] Barriers to Discharge: Continued Medical Work up  Expected Discharge Plan and Services Expected Discharge Plan: [place] arrangements for the past 2 months: Single Family Home                                       Social Determinants of Health (SDOH) Interventions    Readmission Risk Interventions No flowsheet data found.

## 2020-12-02 NOTE — Progress Notes (Addendum)
Physical Therapy Treatment Patient Details Name: Autumn [name] MRN: [id number] DOB: 12-Apr-1950 Today's Date: 12/02/2020   History of Present Illness 70 yo F admitted 10/29 s/p falls at home, worsening left foot chronic diabetic ulcer with osteomyelitis 2nd+3rd metatarsals and cellulitis noted on MRI, s/p L foot amputation of the 2nd metatarsal and toe, I&D deep abscess multiple fascial planes, and bone biopsy open deep third metatarsal on 10/31. Other comorbidities include sepsis without shock secondary to osteomyelitis and cellulitis left foot (IV zosyn), AKI on CKD, rapid afib (Cardizem + Heparin gtt), NSTEMI suspect demand ischemia, anemia acute on chronic. Transferred to ICU and intubated 11/2 after evolving neuro changes with unresponsiveness after coughing episode. 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    PT Comments    Patient agreeable to PT. She was already sitting up on edge of bed, finishing breakfast. Patient progressed to getting out of bed this session. Max A required for incremental lateral scoot transfer from bed to recliner chair. Patient needs assistance and cues to maintain NWB of LLE during transfer. Patient fatigued with activity but pleased to be sitting up in the chair. Recommend to continue PT to maximize independence and facilitate return to prior level of function. SNF recommended at discharge.     Recommendations for follow up therapy are one component of a multi-disciplinary discharge planning process, led by the attending physician.  Recommendations may be updated based on patient status, additional functional criteria and insurance authorization.  Follow Up Recommendations  Skilled nursing-short term rehab (<3 hours/day)     Assistance Recommended at Discharge Frequent or constant Supervision/Assistance  Equipment Recommendations  Other (comment) (to  be determined at next level of care)    Recommendations for Other Services       Precautions / Restrictions Precautions Precautions: Fall Restrictions Weight Bearing Restrictions: Yes LLE Weight Bearing: Non weight bearing     Mobility  Bed Mobility               General bed mobility comments: not assessed as patient sitting up on edge of bed on arrival to room with nurse present    Transfers Overall transfer level: Needs assistance   Transfers: Bed to chair/wheelchair/BSC            Lateral/Scoot Transfers: Max assist General transfer comment: verbal and tactile cues for incremental scooting from bed to recliner chair with drop arm in lowered position. assistance required to extend LLE to maintain NWB during transfers. patient sitting up in chair at end of session    Ambulation/Gait               General Gait Details: unable to at this time   Stairs             Wheelchair Mobility    Modified Rankin (Stroke Patients Only)       Balance Overall balance assessment: Needs assistance Sitting-balance support: No upper extremity supported Sitting balance-Leahy Scale: Good                                      Cognition Arousal/Alertness: Awake/alert Behavior During Therapy: WFL for tasks assessed/performed Overall Cognitive Status: Within Functional Limits for tasks assessed  General Comments: patient is able to follow single step commands with increased time.        Exercises General Exercises - Lower Extremity Long Arc Quad: AAROM;Strengthening;Both;10 reps;Seated Other Exercises Other Exercises: verbal cues for exercise technique    General Comments        Pertinent Vitals/Pain Pain Assessment: No/denies pain    Home Living                          Prior Function            PT Goals (current goals can now be found in the care plan section) Acute  Rehab PT Goals Patient Stated Goal: to get to the chair PT Goal Formulation: With patient Time For Goal Achievement: 12/12/20 Potential to Achieve Goals: Fair Progress towards PT goals: Progressing toward goals    Frequency    Min 2X/week      PT Plan Current plan remains appropriate    Co-evaluation              AM-PAC PT "6 Clicks" Mobility   Outcome Measure  Help needed turning from your back to your side while in a flat bed without using bedrails?: A Little Help needed moving from lying on your back to sitting on the side of a flat bed without using bedrails?: A Little Help needed moving to and from a bed to a chair (including a wheelchair)?: A Lot Help needed standing up from a chair using your arms (e.g., wheelchair or bedside chair)?: Total Help needed to walk in hospital room?: Total Help needed climbing 3-5 steps with a railing? : Total 6 Click Score: 11    End of Session Equipment Utilized During Treatment:  (wound vac in place) Activity Tolerance: Patient tolerated treatment well Patient left: in chair;with call bell/phone within reach;with chair alarm set Nurse Communication: Mobility status PT Visit Diagnosis: Other abnormalities of gait and mobility (R26.89);Hemiplegia and hemiparesis;Muscle weakness (generalized) (M62.81);History of falling (Z91.81)     Time: 0349-1791 PT Time Calculation (min) (ACUTE ONLY): 38 min  Charges:  $Therapeutic Activity: 38-52 mins                     Minna Merritts, PT, MPT    Percell Locus 12/02/2020, 12:40 PM

## 2020-12-02 NOTE — Progress Notes (Signed)
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  She'd been following with a [facility] podiatrist outpatient and had recently refused surgery.  She'd recently been treated with antibiotics at [facility] for the diabetic foot infection with enterobacter aerogans culture positive.  She was admitted on 10/29 due to falls at home with worsening L foot chronic diabetic ulcer with osteomyelitis of the 2nd and 3rd metatarsals and cellulitis noted on MRI.  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  Her post operative course was complicated by unresponsiveness and neurologic changes, she was found to have a stroke and was intubated for airway protection.  She was extubated 11/3 and transferred to [facility] service on 11/4.  Hospital course further complicated by acute on chronic anemia with melena after starting on anticoagulation, requiring blood transfusions and GI evaluation.    Assessment & Plan   Principal Problem:   Sepsis ([place]) Active Problems:   Atrial fibrillation with RVR (HCC)   Osteomyelitis of foot, left, acute ([place])   CKD stage 4 due to type 2 diabetes mellitus ([place])   Liver cirrhosis secondary to NASH (nonalcoholic steatohepatitis) ([place])   Hypertension   History of anemia due to CKD   Frequent falls   AKI (acute kidney injury) ([place])   Cellulitis and abscess of foot, except toes   Infectious tenosynovitis   Wheeze   Atrial fibrillation with rapid ventricular response (HCC)   Bilateral carotid artery stenosis   Acute Blood Loss Anemia / Iron deficiency / Anemia of CKD Acute anemia occurred in setting of heparin and recent surgical procedure - postop bleeding from surgical wound.    Received total 4 units pRBC's, most recently on 11/14. Hgb stable in 8's-9's  Labs c/w iron def, vit B12 elevated, folate wnl.   --Monitor Hgb, transfuse to keep Hgb >7 --Continue iron suppl -- Resume Eliquis tonight --GI consult as below   GI bleed - with melena with blood clots seen 11/15.   Pt had been started on Eliquis for 2ndary stroke prevention due to embolic stroke this admission. EGD on  11/16 -white nummular lesions in the esophageal mucosa (yeast on brushings), gastritis, erythematous duodenopathy Colonoscopy on 11/18 -poor prep, multiple polyps removed --GI consulted - plan for colonoscopy today if adequate prep consumed --Eliquis was held x 3 more days after colonoscopy given polypectomy, resume evening of 11/21 --IV PPI BID  Esophageal candidiasis - fluconazole 400 mg IV x 1 then 200 mg daily x 14-21 days (IV for now, switch to PO when diet resumed after scopes)     Stroke Expressive aphasia, improved Neurology suspects periprocedural vs related to atrial fibrillation MRI brian with acute to early subacute infarction in the L posterior frontal cortical and subcortical brain, possibly affecting the precentral gyrus.   Echo with EF 55-60%, dilated LA, patent foramen ovale Carotid US right ICA artery worrisome for a subtotal occlusion.  Moderate to large amount of L sided atheroscelotic plaque, however, Left carotid angiogram with Dr. Delana Meyer showed no significant stenosis. -- Resuming Eliquis this evening --Continue statin --Anticipate discharge to SNF (CIR recommended but not covered by her insurance)  Acute Metabolic Encephalopathy - Related to acute infection and above --Delirium precautions --Follow and w/u further as indicated   Septic Shock Diabetic Foot Infection Left Foot Osteomyelitis and Cellulitis  Septic Arthritis MRI foot with septic arthritis of second MTP joint and osteomyelitis of 2nd metatarsal head and second proximal phalanx, severe soft tissue  swelling around second metatarsal neck concerning for phlegmon developing abscess measuring 2.1x1 cm circumferentially around distal shaft.  Mild early osteomyelitis of 3rd metatarsal head.  Severe soft tissue edema of the forefoot c/w cellulitis. MRI ankle limited -> interval resection of 2nd metatarsal, longitudinal tear of peroneus brevis with peroneus tenosynovitis, distal tibialis posterior tenosynovitis and tendinopathy, extensor digitorum tenosynovitis.  Extensive subcutaneous edema circumferentially in distal calf.  Dorsal subcutaneous edema in foot. S/p 2nd ray amputation, I&D, and bone bx of 3rd metatarsal on 11/1 --ID consulted --Continue Unasyn x 4 weeks until 12/09/20 - followed by PO augmentin until 12/23/20 --NWB LLE --Continue wound VAC --ID followup as outpatient   Acute Hypoxic Respiratory Failure Aspiration Pneumonia, treated Pulm edema 2/2 fluid overload w/ hx of recurrent flash pulm edema CXR 11/3 with right lung opacity concerning for superimposed infection - also with findings concerning for pulmonary edema and small right effusion Aspiration PNA considered, treated with IV Unasyn (for foot wound) Rapid response called early morning 11/10 for respiratory distress.   CXR showed pulm edema (pt had received MIVF for 4 days before I d/c'ed it yesterday).   Started on BiPAP, and given IV lasix 40 x1, 80 x1, metolazone x1 --Continue supplemental O2 to keep sats >=90%, wean as tolerated --BiPAP nightly for likely OSA -- Diuretic resumed: Bumex 4 mg daily per nephrology   Atrial Fibrillation with RVR - developed while meds were held for NPO status and procedures Cardiology increased rate control agents, now HR controlled.   Lopressor has been held half of the time due to low HR, discontinued. --Continue Cardizem 240 mg daily --d/c Lopressor  -- 11/21: Resume Eliquis this evening (held due to GI bleeding, and 3 days post colonoscopy due to polypectomies)   Hypertension -  BP's now elevated. --Continue Cardizem 240 mg daily --Lopressor d/c'd due to bradycardia -- Increase hydralazine 50>> 100 mg q8h --Resumed home clonidine nightly --PRN IV hydralazine for SBP>180 --allow BP to run on the high side due to GI bleed   Thrombocytopenia - HIT Ab negative.  Likely due to chronic liver disease. Platelets have been stable in the 80s to 90s. --monitor CBC   Dysphagia - 2/2 stroke --SLP following, seen on 11/12 --on Dysphagia 3 diet, thin liquid   AKI on CKD IV - Baseline creatinine around 3.2.   CKD secondary to diabetes.  AKI secondary to ATN. --Nephrology following --hold home lisinopril  --Resumed on Bumex 4 mg daily (11/19)   Elevated Troponin - due to demand ischemia.  --Cardiology following   Hx NASH cirrhosis -overall compensated at this time, but monitor closely for third spacing and edema.  Appears to be on Bumex 4 mg twice daily at home, without spironolactone.   -- Resumed on Bumex, dosing per nephrology  --Strict I/O's Daily weights -monitor volume status closely   Hypothyroidism - Synthroid   Type 2 diabetes -controlled, last A1c 6.3%. Glucose has been inpatient goal or lower --no need for CBGs and SSI --Monitor fasting glucose with BMP  Constipation -resolved with scheduled bowel regimen. --Changed MiraLAX and senna to as needed  Obesity: Body mass index is 34.8 kg/m.  Complicates overall care and prognosis.  Recommend lifestyle modifications including physical activity and diet for weight loss and overall long-term health.   DVT prophylaxis: Place and maintain sequential compression device Start: 11/15/20 1527 apixaban (ELIQUIS) tablet 5 mg   Diet:  Diet Orders (From admission, onward)     Start     Ordered   11/29/20 1751  DIET DYS 3 Room service appropriate? Yes; Fluid consistency: Thin  Diet effective now       Question Answer Comment  Room service appropriate? Yes   Fluid consistency: Thin      11/29/20 1756               Code Status: Full Code   Subjective 12/02/20    Patient just worked with physical therapy and was up in recliner when seen this morning.  States it feels good to be up out of bed.  She reports being cold, draft from the window next to her chair, but not otherwise not feeling sick.  She does report having diarrhea on a scheduled bowel regimen.  No abdominal pain or nausea vomiting.   Disposition Plan & Communication   Status is: Inpatient  Remains inpatient appropriate because: Severity of illness  Dispo: SNF placement pending.    Consults, Procedures, Significant Events   Consultants:  Gastroenterology Nephrology Infectious disease Cardiology Podiatry Palliative care Neurology  Procedures:  Left foot second ray amputation 11/1 EGD 11/16  Antimicrobials:  Anti-infectives (From admission, onward)    Start     Dose/Rate Route Frequency Ordered Stop   11/30/20 1400  fluconazole (DIFLUCAN) IVPB 200 mg        200 mg 100 mL/hr over 60 Minutes Intravenous Every 24 hours 11/29/20 1633 12/13/20 1359   11/29/20 1800  fluconazole (DIFLUCAN) IVPB 400 mg        400 mg 100 mL/hr over 120 Minutes Intravenous  Once 11/29/20 1633 11/30/20 0724   11/29/20 1700  fluconazole (DIFLUCAN) IVPB 200 mg  Status:  Discontinued        200 mg 100 mL/hr over 60 Minutes Intravenous Every 24 hours 11/29/20 1611 11/29/20 1633   11/23/20 0000  ceFAZolin (ANCEF) IVPB 1 g/50 mL premix       Note to Pharmacy: To be given in specials   1 g 100 mL/hr over 30 Minutes Intravenous  Once 11/22/20 1106 11/22/20 1750   11/14/20 2200  Ampicillin-Sulbactam (UNASYN) 3 g in sodium chloride 0.9 % 100 mL IVPB        3 g 200 mL/hr over 30 Minutes Intravenous Every 12 hours 11/14/20 1610     11/14/20 0900  piperacillin-tazobactam (ZOSYN) IVPB 2.25 g  Status:  Discontinued        2.25 g 100 mL/hr over 30 Minutes Intravenous Every 8 hours 11/14/20 0805 11/14/20 1609   11/13/20 2200  piperacillin-tazobactam  (ZOSYN) IVPB 3.375 g  Status:  Discontinued        3.375 g 12.5 mL/hr over 240 Minutes Intravenous Every 12 hours 11/13/20 0758 11/14/20 0805   11/13/20 0600  piperacillin-tazobactam (ZOSYN) IVPB 2.25 g        2.25 g 100 mL/hr over 30 Minutes Intravenous Every 8 hours 11/12/20 2155 11/13/20 1725   11/11/20 1600  vancomycin (VANCOCIN) IVPB 1000 mg/200 mL premix  Status:  Discontinued        1,000 mg 200 mL/hr over 60 Minutes Intravenous Every 48 hours 11/09/20 1618 11/10/20 1045   11/11/20 1558  vancomycin (VANCOCIN) powder  Status:  Discontinued  As needed 11/11/20 1558 11/11/20 1558   11/10/20 2000  vancomycin (VANCOCIN) IVPB 1000 mg/200 mL premix  Status:  Discontinued        1,000 mg 200 mL/hr over 60 Minutes Intravenous Every 48 hours 11/10/20 1918 11/12/20 0940   11/09/20 1800  ceFEPIme (MAXIPIME) 2 g in sodium chloride 0.9 % 100 mL IVPB  Status:  Discontinued        2 g 200 mL/hr over 30 Minutes Intravenous Every 24 hours 11/09/20 1619 11/12/20 2146   11/09/20 1619  vancomycin variable dose per unstable renal function (pharmacist dosing)  Status:  Discontinued         Does not apply See admin instructions 11/09/20 1619 11/12/20 1521   11/09/20 1615  vancomycin (VANCOREADY) IVPB 1750 mg/350 mL        1,750 mg 175 mL/hr over 120 Minutes Intravenous  Once 11/09/20 1606 11/09/20 1947   11/09/20 1600  metroNIDAZOLE (FLAGYL) IVPB 500 mg  Status:  Discontinued        500 mg 100 mL/hr over 60 Minutes Intravenous Every 8 hours 11/09/20 1555 11/12/20 2146         Micro    Objective   Vitals:   12/02/20 0510 12/02/20 0600 12/02/20 0837 12/02/20 1135  BP: (!) 162/90   (!) 154/81  Pulse: 69   (!) 51  Resp: 15   17  Temp: 98.1 F (36.7 C)   98.7 F (37.1 C)  TempSrc:      SpO2: 99%  96% 99%  Weight:  97.8 kg    Height:        Intake/Output Summary (Last 24 hours) at 12/02/2020 1453 Last data filed at 12/02/2020 0600 Gross per 24 hour  Intake --  Output 1825 ml   Net -1825 ml   Filed Weights   11/30/20 0507 12/01/20 0500 12/02/20 0600  Weight: 94 kg 94.3 kg 97.8 kg    Physical Exam:  General exam: sitting up in recliner, awake, alert no acute distress, obese Respiratory system: CTAB, normal respiratory effort on room air, on room air. Cardiovascular system: RRR, trace LE edema Central nervous system: A&O x3. no gross focal neurologic deficits, improvement in dysarthria Extremities: Left foot wound VAC in place, left foot gauze dressing in place Psychiatry: normal mood, congruent affect, judgement and insight appear normal  Labs   Data Reviewed: I have personally reviewed following labs and imaging studies  CBC: Recent Labs  Lab 11/27/20 0501 11/28/20 0556 11/29/20 0538 11/30/20 0349 12/02/20 0548  WBC 8.4 7.5 6.2 4.4 4.0  HGB 8.2* 9.1* 8.6* 8.3* 8.8*  HCT 26.8* 29.2* 27.4* 27.0* 27.5*  MCV 96.1 97.0 95.5 96.8 94.2  PLT 79* 80* 71* 64* 70*   Basic Metabolic Panel: Recent Labs  Lab 11/26/20 0057 11/27/20 0501 11/28/20 0556 11/29/20 0538 11/30/20 0349 12/02/20 0548  NA 141 140 141 137 140 139  K 4.2 4.1 4.2 3.7 3.7 3.8  CL 105 107 109 104 107 104  CO2 25 25 22 23 24 25   GLUCOSE 103* 90 91 95 94 113*  BUN 63* 55* 48* 45* 44* 49*  CREATININE 3.88* 3.66* 3.53* 3.52* 3.46* 3.53*  CALCIUM 8.5* 8.5* 8.2* 8.2* 8.3* 8.5*  MG 1.9 1.8 1.9 1.8 2.0  --    GFR: Estimated Creatinine Clearance: 18 mL/min (A) (by C-G formula based on SCr of 3.53 mg/dL (H)). Liver Function Tests: No results for input(s): AST, ALT, ALKPHOS, BILITOT, PROT, ALBUMIN in the last 168 hours. No results  for input(s): LIPASE, AMYLASE in the last 168 hours. No results for input(s): AMMONIA in the last 168 hours. Coagulation Profile: No results for input(s): INR, PROTIME in the last 168 hours.  Cardiac Enzymes: No results for input(s): CKTOTAL, CKMB, CKMBINDEX, TROPONINI in the last 168 hours. BNP (last 3 results) No results for input(s): PROBNP in the last  8760 hours. HbA1C: No results for input(s): HGBA1C in the last 72 hours. CBG: Recent Labs  Lab 11/27/20 1054 11/29/20 1524 11/29/20 2140  GLUCAP 98 98 105*   Lipid Profile: No results for input(s): CHOL, HDL, LDLCALC, TRIG, CHOLHDL, LDLDIRECT in the last 72 hours. Thyroid Function Tests: No results for input(s): TSH, T4TOTAL, FREET4, T3FREE, THYROIDAB in the last 72 hours. Anemia Panel: No results for input(s): VITAMINB12, FOLATE, FERRITIN, TIBC, IRON, RETICCTPCT in the last 72 hours. Sepsis Labs: No results for input(s): PROCALCITON, LATICACIDVEN in the last 168 hours.   Recent Results (from the past 240 hour(s))  KOH prep     Status: None   Collection Time: 11/27/20 11:40 AM   Specimen: Esophagus  Result Value Ref Range Status   Specimen Description ESOPHAGUS  Final   Special Requests Normal  Final   KOH Prep   Final    YEAST PRESENT Performed at [facility], [address], [place], [place] [address]    Report Status 11/27/2020 FINAL  Final      Imaging Studies   No results found.   Medications   Scheduled Meds:  sodium chloride   Intravenous Once   allopurinol  50 mg Oral QODAY   apixaban  5 mg Oral BID   vitamin C  500 mg Oral BID   atorvastatin  40 mg Oral Daily   budesonide (PULMICORT) nebulizer solution  0.5 mg Nebulization BID   bumetanide  4 mg Oral Daily   cloNIDine  0.3 mg Oral QHS   diltiazem  240 mg Oral Daily   diltiazem  10 mg Intravenous Once   ferrous gluconate  324 mg Oral Q breakfast   hydrALAZINE  50 mg Oral Q8H   levothyroxine  100 mcg Oral Q0600   mouth rinse  15 mL Mouth Rinse BID   metoprolol tartrate  50 mg Oral BID   multivitamin with minerals  1 tablet Oral Daily   nutrition supplement (JUVEN)  1 packet Oral BID BM   pantoprazole (PROTONIX) IV  40 mg Intravenous Q12H   Ensure Max Protein  11 oz Oral QHS   sodium chloride flush  3 mL Intravenous Q12H   sodium chloride flush  3 mL Intravenous Q12H   venlafaxine  50  mg Oral BID   Continuous Infusions:  sodium chloride 10 mL/hr at 11/30/20 2310   sodium chloride 250 mL (11/26/20 0942)   sodium chloride 250 mL (11/25/20 1208)   ampicillin-sulbactam (UNASYN) IV 3 g (12/02/20 1033)   fluconazole (DIFLUCAN) IV 200 mg (12/02/20 1356)       LOS: 23 days    Time spent: 25 minutes with > 50% spent at bedside and in coordination of care     Ezekiel Slocumb, DO [facility]  12/02/2020, 2:53 PM      If 7PM-7AM, please contact night-coverage. How to contact the [facility] Attending or Consulting provider [place] or covering provider during after hours [place], for this patient?    Check the care team in [facility] and look for a) attending/consulting TR[facility] provider listed and b) the [facility] team listed Log into  [email or web address] and use [place]'s universal password to access. If you do not have the password, please contact the hospital operator. Locate the [facility] provider you are looking for under [facility] and page to a number that you can be directly reached. If you still have difficulty reaching the provider, please page the [facility] (Director on Call) for the Hospitalists listed on amion for assistance.

## 2020-12-02 NOTE — Progress Notes (Signed)
Occupational Therapy Treatment Patient Details Name: Autumn [name] MRN: [id number] DOB: 1950-04-23 Today's Date: 12/02/2020   History of present illness 70 yo F admitted 10/29 s/p falls at home, worsening left foot chronic diabetic ulcer with osteomyelitis 2nd+3rd metatarsals and cellulitis noted on MRI, s/p L foot amputation of the 2nd metatarsal and toe, I&D deep abscess multiple fascial planes, and bone biopsy open deep third metatarsal on 10/31. Other comorbidities include sepsis without shock secondary to osteomyelitis and cellulitis left foot (IV zosyn), AKI on CKD, rapid afib (Cardizem + Heparin gtt), NSTEMI suspect demand ischemia, anemia acute on chronic. Transferred to ICU and intubated 11/2 after evolving neuro changes with unresponsiveness after coughing episode. 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   OT comments  Autumn [name] seen for OT treatment on this date. Upon arrival to room pt awake/alert and seated upright in chair. Pt agreeable to tx. MAX A +2 + MAX VCs for simulated toilet t/f, VCs for sequencing, assist needed for maintaining NWB precautions. Pt having difficulty coordinating body movements during scooting. Pt making good progress toward goals. Pt continues to benefit from skilled OT services to maximize return to PLOF and minimize risk of future falls, injury, caregiver burden, and readmission. Will continue to follow POC. Discharge recommendation remains appropriate.     Recommendations for follow up therapy are one component of a multi-disciplinary discharge planning process, led by the attending physician.  Recommendations may be updated based on patient status, additional functional criteria and insurance authorization.    Follow Up Recommendations  Skilled nursing-short term rehab (<3 hours/day)    Assistance Recommended at Discharge Frequent or constant  Supervision/Assistance  Equipment Recommendations  Other (comment) (defer to next venue of care)    Recommendations for Other Services      Precautions / Restrictions Precautions Precautions: Fall Restrictions Weight Bearing Restrictions: Yes LLE Weight Bearing: Non weight bearing       Mobility Bed Mobility Overal bed mobility: Needs Assistance         Sit to supine: Min guard   General bed mobility comments:     Transfers Overall transfer level: Needs assistance Equipment used: None Transfers: Bed to chair/wheelchair/BSC            Lateral/Scoot Transfers: Max assist;+2 physical assistance General transfer comment: Pt having difficulty coordinating body movements during scooting and maintaining NWB precautions     Balance Overall balance assessment: Needs assistance Sitting-balance support: No upper extremity supported Sitting balance-Leahy Scale: Good                                     ADL either performed or assessed with clinical judgement   ADL Overall ADL's : Needs assistance/impaired                                       General ADL Comments: MAX A +2 for functional mobility, simulated toilet t/f.    Extremity/Trunk Assessment               Cognition Arousal/Alertness: Awake/alert Behavior During Therapy: WFL for tasks assessed/performed Overall Cognitive Status: Within Functional Limits for tasks assessed  General Comments: patient is able to follow single step commands with increased time.          Exercises Exercises:  Other Exercises Other Exercises: Pt educ re: NWB precautions, OT role, importance of movement for functional mobility Other Exercises: Pt received in chair. Chair<>bed.   Shoulder Instructions       General Comments      Pertinent Vitals/ Pain       Pain Assessment: No/denies pain  Home Living                                           Prior Functioning/Environment              Frequency  Min 3X/week        Progress Toward Goals  OT Goals(current goals can now be found in the care plan section)  Progress towards OT goals: Progressing toward goals  Acute Rehab OT Goals OT Goal Formulation: With patient Time For Goal Achievement: 12/13/20 Potential to Achieve Goals: Good ADL Goals Pt Will Perform Grooming: sitting;with set-up Pt Will Perform Lower Body Dressing: with mod assist;sitting/lateral leans Pt Will Transfer to Toilet: bedside commode;with max assist;anterior/posterior transfer Pt/caregiver will Perform Home Exercise Program: Increased ROM;Increased strength;Both right and left upper extremity;With minimal assist  Plan Discharge plan remains appropriate;Frequency remains appropriate    Co-evaluation                 AM-PAC OT "6 Clicks" Daily Activity     Outcome Measure   Help from another person eating meals?: A Little Help from another person taking care of personal grooming?: A Little Help from another person toileting, which includes using toliet, bedpan, or urinal?: A Lot Help from another person bathing (including washing, rinsing, drying)?: A Lot Help from another person to put on and taking off regular upper body clothing?: A Little Help from another person to put on and taking off regular lower body clothing?: A Lot 6 Click Score: 15    End of Session    OT Visit Diagnosis: Other abnormalities of gait and mobility (R26.89);Hemiplegia and hemiparesis;Muscle weakness (generalized) (M62.81)   Activity Tolerance Patient tolerated treatment well   Patient Left in bed;with call bell/phone within reach;Other (comment) (w/ speech therapy in room)   Nurse Communication          Time: [id number] OT Time Calculation (min): 15 min  Charges: OT General Charges $OT Visit: 1 Visit OT Treatments $Self Care/Home Management : 8-22 mins  Nino Glow, Markus Daft 12/02/2020, 1:45 PM

## 2020-12-02 NOTE — Progress Notes (Signed)
[facility]  PROGRESS NOTE   Subjective:   Working with physical therapist this morning Has left foot wound VAC 1+ edema on the right ankle Breathing comfortably on room air Serum creatinine slight increase from yesterday to 3.53  Objective:  Vital signs in last 24 hours:  Temp:  [97.8 F (36.6 C)-98.9 F (37.2 C)] 98.1 F (36.7 C) (11/21 0510) Pulse Rate:  [53-69] 69 (11/21 0510) Resp:  [14-22] 15 (11/21 0510) BP: (147-162)/(80-90) 162/90 (11/21 0510) SpO2:  [94 %-100 %] 96 % (11/21 0837) Weight:  [97.8 kg] 97.8 kg (11/21 0600)  Weight change: 3.5 kg Filed Weights   11/30/20 0507 12/01/20 0500 12/02/20 0600  Weight: 94 kg 94.3 kg 97.8 kg    Intake/Output: I/O last 3 completed shifts: In: 160 [P.O.:60; IV Piggyback:100] Out: 6389 [Urine:3275]   Intake/Output this shift:  No intake/output data recorded.  Physical Exam: General:  No acute distress, sitting on the side of her bed  Head:  Normocephalic, atraumatic. Moist oral mucosal membranes  Neck:  Supple  Lungs:   normal effort on room air, mild crackles at bases  Heart:  irregular  Abdomen:   Soft,    Extremities:  1+right lower extremity peripheral edema. +wound vac to left foot.   Neurologic:  Awake, alert, following commands  Skin:  No lesions       Basic Metabolic Panel: Recent Labs  Lab 11/26/20 0057 11/27/20 0501 11/28/20 0556 11/29/20 0538 11/30/20 0349 12/02/20 0548  NA 141 140 141 137 140 139  K 4.2 4.1 4.2 3.7 3.7 3.8  CL 105 107 109 104 107 104  CO2 25 25 22 23 24 25   GLUCOSE 103* 90 91 95 94 113*  BUN 63* 55* 48* 45* 44* 49*  CREATININE 3.88* 3.66* 3.53* 3.52* 3.46* 3.53*  CALCIUM 8.5* 8.5* 8.2* 8.2* 8.3* 8.5*  MG 1.9 1.8 1.9 1.8 2.0  --      CBC: Recent Labs  Lab 11/27/20 0501 11/28/20 0556 11/29/20 0538 11/30/20 0349 12/02/20 0548  WBC 8.4 7.5 6.2 4.4 4.0  HGB 8.2* 9.1* 8.6* 8.3* 8.8*  HCT 26.8* 29.2* 27.4* 27.0* 27.5*  MCV 96.1 97.0 95.5 96.8 94.2  PLT  79* 80* 71* 64* 70*      Urinalysis: No results for input(s): COLORURINE, LABSPEC, PHURINE, GLUCOSEU, HGBUR, BILIRUBINUR, KETONESUR, PROTEINUR, UROBILINOGEN, NITRITE, LEUKOCYTESUR in the last 72 hours.  Invalid input(s): APPERANCEUR    Imaging: No results found.   Medications:    sodium chloride 10 mL/hr at 11/30/20 2310   sodium chloride 250 mL (11/26/20 0942)   sodium chloride 250 mL (11/25/20 1208)   ampicillin-sulbactam (UNASYN) IV 3 g (12/02/20 1033)   fluconazole (DIFLUCAN) IV 200 mg (12/01/20 1501)    sodium chloride   Intravenous Once   allopurinol  50 mg Oral QODAY   apixaban  5 mg Oral BID   vitamin C  500 mg Oral BID   atorvastatin  40 mg Oral Daily   budesonide (PULMICORT) nebulizer solution  0.5 mg Nebulization BID   bumetanide  4 mg Oral Daily   cloNIDine  0.3 mg Oral QHS   diltiazem  240 mg Oral Daily   diltiazem  10 mg Intravenous Once   ferrous gluconate  324 mg Oral Q breakfast   hydrALAZINE  50 mg Oral Q8H   levothyroxine  100 mcg Oral Q0600   mouth rinse  15 mL Mouth Rinse BID   metoprolol tartrate  50 mg Oral BID   multivitamin  with minerals  1 tablet Oral Daily   nutrition supplement (JUVEN)  1 packet Oral BID BM   pantoprazole (PROTONIX) IV  40 mg Intravenous Q12H   Ensure Max Protein  11 oz Oral QHS   sodium chloride flush  3 mL Intravenous Q12H   sodium chloride flush  3 mL Intravenous Q12H   venlafaxine  50 mg Oral BID    Assessment/ Plan:     Principal Problem:   Sepsis ([place]) Active Problems:   Atrial fibrillation with RVR (HCC)   Osteomyelitis of foot, left, acute (HCC)   CKD stage 4 due to type 2 diabetes mellitus (HCC)   Liver cirrhosis secondary to NASH (nonalcoholic steatohepatitis) (HCC)   Hypertension   History of anemia due to CKD   Frequent falls   AKI (acute kidney injury) ([place])   Cellulitis and abscess of foot, except toes   Infectious tenosynovitis   Wheeze   Atrial fibrillation with rapid ventricular response  (HCC)   Bilateral carotid artery stenosis  Ms. Autumn [name] is a 70 y.o. white female with hypertension, diabetes mellitus type II, peripheral vascular disease, sleep apnea, atrial fibrillation who is admitted to [facility] on 11/09/2020 for Atrial fibrillation with rapid ventricular response ([place]) [I48.91] AKI (acute kidney injury) ([place]) [N17.9] Osteomyelitis of foot, left, acute (HCC) [G95.621] Atrial fibrillation with RVR ([place]) [I48.91] Worsening renal function [N28.9] Syncope, unspecified syncope type [R55]  Hospital course complicated by acute kidney injury and requiring amputation on 10/29 by Dr. Sherryle Lis. Patient with atrial fibrillation and acute CVA this admission.   #1: Acute kidney injury on chronic kidney disease stage IV with proteinuria: baseline creatinine of 3.19, GFR of 15 on 09/27/2020. Chronic kidney disease secondary to diabetes. Followed by Dr. Radene Knee, [facility].  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 Now with wound vac and IV unasyn and fluconazole    #3: Anemia with chronic kidney disease: status post PRBC transfusions. Appreciate GI input. Status post EGD and colonoscopy.  Dr. Haig Prophet on November 29, 2020 -Noted to have internal hemorrhoids, multiple polyps removed, diverticulosis in sigmoid and descending colon.   #4: Diabetes mellitus type II with chronic kidney disease: insulin dependent. Hemoglobin A1c of 6.3%.  On November 09, 2020      LOS: [place], MD [facility] 11/21/202211:35 AM

## 2020-12-02 NOTE — Progress Notes (Signed)
Speech Language Pathology Treatment:    Patient Details Name: Autumn [name] MRN: [id number] DOB: January 22, 1950 Today's Date: 12/02/2020 Time: 3295-1884 SLP Time Calculation (min) (ACUTE ONLY): 30 min  Assessment / Plan / Recommendation Clinical Impression  Pt seen for further diagnostic evaluation utilizing informal means and portions of Cognistat. Based on today's assessment, pt presents with mild difficulty with short term and working memory as evidenced by scores of 8/12 on Memory Subtest (4 word delayed recall with distraction) and 1/4 on Calculation subtest (simple mental calculations). Pt scored WFL Orientation, Attention (digit span forward), Repetition, Naming, Similarities, and Judgement subtests. Did not administer Comprehension, Naming, or Construction subtests.   Today, pt's speech judged to be fluent and appropriate with only mild s/sx dysarthria including articulatory imprecision due to orofacial weakness. Reviewed compensatory strategies for dysarthria. Pt able to identify "speaking louder" as a speech intelligibility strategy that she finds particularly useful. Pt able to utilize with min/mod verbal cues to repair communication breakdowns during informal conversational exchanges. Decreased carryover noted. Pt approx 90% intelligible to an unfamiliar listener during informal conversational exchanges which appears to be improved from previous SLP session. Pt benefits from verbal cues for slowed speech rate, increased vocal loudness, and overarticulation.  Pt and RN made aware of results of assessment, basic functional memory strategies, dysarthria strategies, and SLP POC. Pt verbalized understanding/agreement.   SLP to f/u per POC for skilled SLP services targeting improved speech intelligibility and improved working/short term recall. POC updated to reflect addition of short term and working memory goals.  Anticipate need for post-acute SLP services at d/c to address above mentioned  deficits.      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  She'd been following with a [facility] podiatrist outpatient and had recently refused surgery.  She'd recently been treated with antibiotics at [facility] for the diabetic foot infection with enterobacter aerogans culture positive.  She was admitted on 10/29 due to falls at home with worsening L foot chronic diabetic ulcer with osteomyelitis of the 2nd and 3rd metatarsals and cellulitis noted on MRI.  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  Her post operative course was complicated by unresponsiveness and neurologic changes on 11/13/2021. She was found to have a stroke (MRI Brain showed acute to early subacute infarction in the left posterior frontal cortical and subcortical brain, possibly affecting the precentral gyrus) and was intubated for airway protection.  She was extubated 11/3 and transferred to [facility] service on 11/4.      SLP Plan  Continue with current plan of care;Goals updated      Recommendations for follow up therapy are one component of a multi-disciplinary discharge planning process, led by the attending physician.  Recommendations may be updated based on patient status, additional functional criteria and insurance authorization.          Follow Up Recommendations: Acute inpatient rehab (3hours/day) Assistance recommended at discharge: Intermittent Supervision/Assistance SLP Visit Diagnosis: Dysarthria and anarthria (R47.1) Plan: Continue with current plan of care;Goals updated       Autumn [name], M.S., [facility] [phone] ([place])             Autumn [name]  12/02/2020, 1:56 PM

## 2020-12-02 NOTE — Care Management Important Message (Signed)
Important Message  Patient Details  Name: Autumn [name] MRN: [id number] Date of Birth: 27-Oct-1950   Medicare Important Message Given:  Yes     Dannette Barbara 12/02/2020, 5:10 PM

## 2020-12-03 DIAGNOSIS — I4891 Unspecified atrial fibrillation: Secondary | ICD-10-CM | POA: Diagnosis not present

## 2020-12-03 DIAGNOSIS — I10 Essential (primary) hypertension: Secondary | ICD-10-CM | POA: Diagnosis not present

## 2020-12-03 DIAGNOSIS — E1122 Type 2 diabetes mellitus with diabetic chronic kidney disease: Secondary | ICD-10-CM | POA: Diagnosis not present

## 2020-12-03 DIAGNOSIS — M86172 Other acute osteomyelitis, left ankle and foot: Secondary | ICD-10-CM | POA: Diagnosis not present

## 2020-12-03 LAB — BASIC METABOLIC PANEL
Anion gap: 7 (ref 5–15)
BUN: 50 mg/dL — ABNORMAL HIGH (ref 8–23)
CO2: 25 mmol/L (ref 22–32)
Calcium: 8.3 mg/dL — ABNORMAL LOW (ref 8.9–10.3)
Chloride: 107 mmol/L (ref 98–111)
Creatinine, Ser: 3.54 mg/dL — ABNORMAL HIGH (ref 0.44–1.00)
GFR, Estimated: 13 mL/min — ABNORMAL LOW (ref >60)
Glucose, Bld: 105 mg/dL — ABNORMAL HIGH (ref 70–99)
Potassium: 3.5 mmol/L (ref 3.5–5.1)
Sodium: 139 mmol/L (ref 135–145)

## 2020-12-03 LAB — CBC
HCT: 23.7 % — ABNORMAL LOW (ref 36.0–46.0)
Hemoglobin: 7.7 g/dL — ABNORMAL LOW (ref 12.0–15.0)
MCH: 30.6 pg (ref 26.0–34.0)
MCHC: 32.5 g/dL (ref 30.0–36.0)
MCV: 94 fL (ref 80.0–100.0)
Platelets: 63 10*3/uL — ABNORMAL LOW (ref 150–400)
RBC: 2.52 MIL/uL — ABNORMAL LOW (ref 3.87–5.11)
RDW: 17.9 % — ABNORMAL HIGH (ref 11.5–15.5)
WBC: 3.6 10*3/uL — ABNORMAL LOW (ref 4.0–10.5)
nRBC: 0 % (ref 0.0–0.2)

## 2020-12-03 LAB — SURGICAL PATHOLOGY

## 2020-12-03 MED ORDER — BOOST / RESOURCE BREEZE PO LIQD CUSTOM
1.0000 | Freq: Three times a day (TID) | ORAL | Status: DC
  Administered 2020-12-03 – 2020-12-21 (×37): 1 via ORAL

## 2020-12-03 MED ORDER — SPIRONOLACTONE 25 MG PO TABS
25.0000 mg | ORAL_TABLET | Freq: Every day | ORAL | Status: DC
Start: 2020-12-03 — End: 2020-12-11
  Administered 2020-12-03 – 2020-12-11 (×9): 25 mg via ORAL
  Filled 2020-12-03 (×9): qty 1

## 2020-12-03 MED ORDER — PROSOURCE PLUS PO LIQD
30.0000 mL | Freq: Three times a day (TID) | ORAL | Status: DC
  Administered 2020-12-03 – 2020-12-21 (×30): 30 mL via ORAL
  Filled 2020-12-03 (×56): qty 30

## 2020-12-03 MED ORDER — PANTOPRAZOLE SODIUM 40 MG PO TBEC
40.0000 mg | DELAYED_RELEASE_TABLET | Freq: Two times a day (BID) | ORAL | Status: DC
  Administered 2020-12-03 – 2020-12-21 (×36): 40 mg via ORAL
  Filled 2020-12-03 (×36): qty 1

## 2020-12-03 NOTE — Progress Notes (Signed)
[facility]  PROGRESS NOTE   Subjective:   Patient seen resting in bed Alert and oriented Tolerating meals Denies shortness of breath  Objective:  Vital signs in last 24 hours:  Temp:  [97.8 F (36.6 C)-98.7 F (37.1 C)] 98.4 F (36.9 C) (11/22 1147) Pulse Rate:  [50-62] 55 (11/22 1147) Resp:  [14-20] 20 (11/22 1147) BP: (153-170)/(67-92) 169/79 (11/22 1147) SpO2:  [92 %-99 %] 97 % (11/22 1147) Weight:  [97 kg] 97 kg (11/22 0500)  Weight change: -0.8 kg Filed Weights   12/01/20 0500 12/02/20 0600 12/03/20 0500  Weight: 94.3 kg 97.8 kg 97 kg    Intake/Output: I/O last 3 completed shifts: In: 709.5 [I.V.:162.1; IV Piggyback:547.5] Out: 625 [Urine:625]   Intake/Output this shift:  Total I/O In: 240 [P.O.:240] Out: -   Physical Exam: General:  No acute distress, sitting on the side of her bed  Head:  Normocephalic, atraumatic. Moist oral mucosal membranes  Lungs:   normal effort on room air, mild crackles at bases  Heart:  irregular  Abdomen:   Soft,   Extremities:  1+right lower extremity peripheral edema. +wound vac to left foot.   Neurologic:  Awake, alert, following commands  Skin:  No lesions       Basic Metabolic Panel: Recent Labs  Lab 11/27/20 0501 11/28/20 0556 11/29/20 0538 11/30/20 0349 12/02/20 0548 12/03/20 0401  NA 140 141 137 140 139 139  K 4.1 4.2 3.7 3.7 3.8 3.5  CL 107 109 104 107 104 107  CO2 25 22 23 24 25 25   GLUCOSE 90 91 95 94 113* 105*  BUN 55* 48* 45* 44* 49* 50*  CREATININE 3.66* 3.53* 3.52* 3.46* 3.53* 3.54*  CALCIUM 8.5* 8.2* 8.2* 8.3* 8.5* 8.3*  MG 1.8 1.9 1.8 2.0  --   --      CBC: Recent Labs  Lab 11/28/20 0556 11/29/20 0538 11/30/20 0349 12/02/20 0548 12/03/20 0401  WBC 7.5 6.2 4.4 4.0 3.6*  HGB 9.1* 8.6* 8.3* 8.8* 7.7*  HCT 29.2* 27.4* 27.0* 27.5* 23.7*  MCV 97.0 95.5 96.8 94.2 94.0  PLT 80* 71* 64* 70* 63*      Urinalysis: No results for input(s): COLORURINE, LABSPEC, PHURINE,  GLUCOSEU, HGBUR, BILIRUBINUR, KETONESUR, PROTEINUR, UROBILINOGEN, NITRITE, LEUKOCYTESUR in the last 72 hours.  Invalid input(s): APPERANCEUR    Imaging: No results found.   Medications:    sodium chloride Stopped (12/01/20 2133)   sodium chloride 250 mL (11/26/20 0942)   sodium chloride 250 mL (11/25/20 1208)   ampicillin-sulbactam (UNASYN) IV 3 g (12/03/20 0954)   fluconazole (DIFLUCAN) IV 200 mg (12/03/20 1450)    (feeding supplement) PROSource Plus  30 mL Oral TID BM   sodium chloride   Intravenous Once   allopurinol  50 mg Oral QODAY   apixaban  5 mg Oral BID   vitamin C  500 mg Oral BID   atorvastatin  40 mg Oral Daily   budesonide (PULMICORT) nebulizer solution  0.5 mg Nebulization BID   bumetanide  4 mg Oral Daily   cloNIDine  0.3 mg Oral QHS   diltiazem  240 mg Oral Daily   diltiazem  10 mg Intravenous Once   feeding supplement  1 Container Oral TID BM   ferrous gluconate  324 mg Oral Q breakfast   hydrALAZINE  100 mg Oral Q8H   levothyroxine  100 mcg Oral Q0600   mouth rinse  15 mL Mouth Rinse BID   metoprolol tartrate  50 mg  Oral BID   multivitamin with minerals  1 tablet Oral Daily   pantoprazole  40 mg Oral BID   sodium chloride flush  3 mL Intravenous Q12H   sodium chloride flush  3 mL Intravenous Q12H   spironolactone  25 mg Oral Daily   venlafaxine  50 mg Oral BID    Assessment/ Plan:     Principal Problem:   Sepsis ([place]) Active Problems:   Atrial fibrillation with RVR (HCC)   Osteomyelitis of foot, left, acute (HCC)   CKD stage 4 due to type 2 diabetes mellitus (HCC)   Liver cirrhosis secondary to NASH (nonalcoholic steatohepatitis) (HCC)   Hypertension   History of anemia due to CKD   Frequent falls   AKI (acute kidney injury) ([place])   Cellulitis and abscess of foot, except toes   Infectious tenosynovitis   Wheeze   Atrial fibrillation with rapid ventricular response (HCC)   Bilateral carotid artery stenosis  Autumn [name] is a 70  y.o. white female with hypertension, diabetes mellitus type II, peripheral vascular disease, sleep apnea, atrial fibrillation who is admitted to [facility] on 11/09/2020 for Atrial fibrillation with rapid ventricular response ([place]) [I48.91] AKI (acute kidney injury) ([place]) [N17.9] Osteomyelitis of foot, left, acute ([place]) [G86.761] Atrial fibrillation with RVR ([place]) [I48.91] Worsening renal function [N28.9] Syncope, unspecified syncope type [R55]  Hospital course complicated by acute kidney injury and requiring amputation on 10/29 by Dr. Sherryle Lis. Patient with atrial fibrillation and acute CVA this admission.   #1: Acute kidney injury on chronic kidney disease stage IV with proteinuria: baseline creatinine of 3.19, GFR of 15 on 09/27/2020. Chronic kidney disease secondary to diabetes. Followed by Dr. Radene Knee, [facility].  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 Now with wound vac and IV unasyn and fluconazole    #3: Anemia with chronic kidney disease: status post PRBC transfusions. Appreciate GI input. Status post EGD and colonoscopy.  Dr. Haig Prophet on November 29, 2020 -Noted to have internal hemorrhoids, multiple polyps removed, diverticulosis in sigmoid and descending colon. - Hgb 7.7   #4: Diabetes mellitus type II with chronic kidney disease: insulin dependent. Hemoglobin A1c of 6.3%.  On November 09, 2020    LOS: [facility] 11/22/20222:54 PM

## 2020-12-03 NOTE — TOC Progression Note (Addendum)
Transition of Care [facility]) - Progression Note    Patient Details  Name: Autumn [name] MRN: [id number] Date of Birth: 10/16/1950  Transition of [facility]) CM/SW [place], Olive [place] Phone Number: 12/03/2020, 1:01 PM  Clinical Narrative:     Mendel Corning has insurance authorization for patient and can accept her as early as tomorrow, CSW has reached out to MD for anticipated discharge timeframe.   Per [place] insurance Josem Kaufmann is good until 11/30.  Expected Discharge Plan: [place] Barriers to Discharge: Continued Medical Work up  Expected Discharge Plan and Services Expected Discharge Plan: [place] arrangements for the past 2 months: Single Family Home                                       Social Determinants of Health (SDOH) Interventions    Readmission Risk Interventions No flowsheet data found.

## 2020-12-03 NOTE — Progress Notes (Signed)
Nutrition Follow-up  DOCUMENTATION CODES:   Obesity unspecified  INTERVENTION:   -D/c Ensure Max -D/c Juven -Boost Breeze po TID, each supplement provides 250 kcal and 9 grams of protein  -30 ml Prosource Plus TID, each supplement provides 100 kcals and 15 grams protein -Continue MVI with minerals daily  NUTRITION DIAGNOSIS:   Increased nutrient needs related to wound healing as evidenced by estimated needs.  Ongoing  GOAL:   Patient will meet greater than or equal to 90% of their needs  Progressing   MONITOR:   PO intake, Supplement acceptance, Diet advancement, Labs, Weight trends, Skin, I & O's  REASON FOR ASSESSMENT:   Consult Wound healing  ASSESSMENT:   Patient is a 70 year old female who presents to the ER for evaluation of frequent falls and is found to be septic from left foot osteomyelitis and also in rapid A. fib.  10/31- s/p  Procedure(s): AMPUTATION RAY - 2nd, bone biopsy 3rd metatarsal head 11/2- intubated 11/3- MRI of brain revealed acute lt precentral gyrus stroke, extubated 11/4- s/p BSE- advanced to dysphagia 1 diet with nectar thick liquids 11/8- s/p BSE- advanced to dysphagia 2 diet with thin liquids, NGT d/c 11/11- s/p aortogram 11/16- s/p EGD- revealed white nummular lesions in esophageal mucosa, gastritis, and erythematous duodenopathy  Reviewed I/O's: +710 ml x 24 hours and -1.1 L somce 11/19/20  UOP: 400 ml x 24 hours   Spoke with pt at bedside, who reports feeling down at time of visit. She feels isolated due to being in the hospital for so long and she misses her dog. She reports that her appetite is poor and does not feel like eating. Additionally, she does not like the hospital food. Offered to liberalize diet, however, pt does not think this would benefit her secondary to missing teeth.   Noted multiple supplements on tray table. Pt does not like Ensure Max or Juven. She is willing to try Boost Breeze. Discussed importance of good  meal and supplement intake to promote healing. Also brought pt a soft drink per her request. Provided active listening and comfort to pt.   Per TOC notes, pt may discharge to SNF by tomorrow.   Medications reviewed and include vitamin C and cardizem.   Labs reviewed: CBGS: 98-105 (inpatient orders for glycemic control are none).     Diet Order:   Diet Order             DIET DYS 3 Room service appropriate? Yes; Fluid consistency: Thin  Diet effective now                   EDUCATION NEEDS:   Education needs have been addressed  Skin:  Skin Assessment: Skin Integrity Issues: Skin Integrity Issues:: Diabetic Ulcer Diabetic Ulcer: lt foot  Last BM:  12/02/20  Height:   Ht Readings from Last 1 Encounters:  11/09/20 5\' 6"  (1.676 m)    Weight:   Wt Readings from Last 1 Encounters:  12/03/20 97 kg    Ideal Body Weight:  59.1 kg  BMI:  Body mass index is 34.52 kg/m.  Estimated Nutritional Needs:   Kcal:  1800-2100kcal/day  Protein:  90-105g/day  Fluid:  1.5-1.8L/day    Loistine Chance, RD, LDN, [place] Registered Dietitian II Certified Diabetes Care and Education Specialist Please refer to AMION for RD and/or RD on-call/weekend/after hours pager

## 2020-12-03 NOTE — Progress Notes (Signed)
PHARMACIST - PHYSICIAN COMMUNICATION  DR:   Arbutus Ped  CONCERNING: IV to Oral Route Change Policy  RECOMMENDATION: This patient is receiving pantoprazole by the intravenous route.  Based on criteria approved by the Pharmacy and Therapeutics Committee, the intravenous medication(s) is/are being converted to the equivalent oral dose form(s).   DESCRIPTION: These criteria include: The patient is eating (either orally or via tube) and/or has been taking other orally administered medications for a least 24 hours The patient has no evidence of active gastrointestinal bleeding or impaired GI absorption (gastrectomy, short bowel, patient on TNA or NPO).  If you have questions about this conversion, please contact the Pharmacy Department  []   [phone] )  [facility] [x]   [phone] )  [facility] []   [phone] )  [facility] []   [phone] )  [facility] []   [phone] )  [place], PharmD, BCPS Clinical Pharmacist   12/03/2020 1:07 PM

## 2020-12-03 NOTE — Progress Notes (Signed)
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  She'd been following with a [facility] podiatrist outpatient and had recently refused surgery.  She'd recently been treated with antibiotics at [facility] for the diabetic foot infection with enterobacter aerogans culture positive.  She was admitted on 10/29 due to falls at home with worsening L foot chronic diabetic ulcer with osteomyelitis of the 2nd and 3rd metatarsals and cellulitis noted on MRI.  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  Her post operative course was complicated by unresponsiveness and neurologic changes, she was found to have a stroke and was intubated for airway protection.  She was extubated 11/3 and transferred to [facility] service on 11/4.  Hospital course further complicated by acute on chronic anemia with melena after starting on anticoagulation, requiring blood transfusions and GI evaluation.    Assessment & Plan   Principal Problem:   Sepsis ([place]) Active Problems:   Atrial fibrillation with RVR (HCC)   Osteomyelitis of foot, left, acute ([place])   CKD stage 4 due to type 2 diabetes mellitus ([place])   Liver cirrhosis secondary to NASH (nonalcoholic steatohepatitis) ([place])   Hypertension   History of anemia due to CKD   Frequent falls   AKI (acute kidney injury) ([place])   Cellulitis and abscess of foot, except toes   Infectious tenosynovitis   Wheeze   Atrial fibrillation with rapid ventricular response (HCC)   Bilateral carotid artery stenosis   Acute Blood Loss Anemia / Iron deficiency / Anemia of CKD Acute anemia occurred in setting of heparin and recent surgical procedure - postop bleeding from surgical wound.    Received total 4 units pRBC's, most recently on 11/14. Hgb stable in 8's-9's, slight decline to 7.7 today Labs c/w iron def, vit B12 elevated, folate wnl.   --Monitor Hgb, transfuse to keep Hgb >7 --Continue iron suppl -- Eliquis resumed last night, monitor closely --GI consult as below   GI bleed - with melena with blood clots seen 11/15.   Pt had been started on Eliquis for 2ndary stroke prevention due to embolic stroke this admission. EGD on  11/16 -white nummular lesions in the esophageal mucosa (yeast on brushings), gastritis, erythematous duodenopathy Colonoscopy on 11/18 -poor prep, multiple polyps removed --GI consulted  --IV PPI BID, transition to oral per pharmacy protocol --Monitor closely for recurrent bleeding with resumption of Eliquis  Esophageal candidiasis - fluconazole 400 mg IV x 1 then 200 mg daily x 14-21 days (IV for now, switch to PO when diet resumed after scopes)    Stroke Expressive aphasia, improved Neurology suspects periprocedural vs related to atrial fibrillation MRI brian with acute to early subacute infarction in the L posterior frontal cortical and subcortical brain, possibly affecting the precentral gyrus.   Echo with EF 55-60%, dilated LA, patent foramen ovale Carotid US right ICA artery worrisome for a subtotal occlusion.  Moderate to large amount of L sided atheroscelotic plaque, however, Left carotid angiogram with Dr. Delana Meyer showed no significant stenosis. -- Eliquis resumed 11/21 PM --Continue statin --Anticipate discharge to SNF (CIR recommended but not covered by her insurance)   Acute  Metabolic Encephalopathy - Related to acute infection and above --Delirium precautions --Follow and w/u further as indicated   Septic Shock Diabetic Foot Infection Left Foot Osteomyelitis and Cellulitis  Septic Arthritis MRI foot with septic arthritis of second MTP joint and osteomyelitis of 2nd metatarsal head and second proximal phalanx, severe soft  tissue swelling around second metatarsal neck concerning for phlegmon developing abscess measuring 2.1x1 cm circumferentially around distal shaft.  Mild early osteomyelitis of 3rd metatarsal head.  Severe soft tissue edema of the forefoot c/w cellulitis. MRI ankle limited -> interval resection of 2nd metatarsal, longitudinal tear of peroneus brevis with peroneus tenosynovitis, distal tibialis posterior tenosynovitis and tendinopathy, extensor digitorum tenosynovitis.  Extensive subcutaneous edema circumferentially in distal calf.  Dorsal subcutaneous edema in foot. S/p 2nd ray amputation, I&D, and bone bx of 3rd metatarsal on 11/1 --ID consulted --Continue Unasyn x 4 weeks until 12/09/20 - followed by PO augmentin until 12/23/20 --NWB LLE --Continue wound VAC --ID followup as outpatient   Acute Hypoxic Respiratory Failure Aspiration Pneumonia, treated Pulm edema 2/2 fluid overload w/ hx of recurrent flash pulm edema CXR 11/3 with right lung opacity concerning for superimposed infection - also with findings concerning for pulmonary edema and small right effusion Aspiration PNA considered, treated with IV Unasyn (for foot wound) Rapid response called early morning 11/10 for respiratory distress.   CXR showed pulm edema (pt had received MIVF for 4 days, stopped).   Started on BiPAP, and given IV lasix 40 x1, 80 x1, metolazone x1 11/22: Respiratory status has been stable on room air this week. --Continue supplemental O2 to keep sats >=90%, wean as tolerated --BiPAP nightly for likely OSA -- Diuretic resumed: Bumex 4 mg daily per nephrology   Atrial Fibrillation with RVR - developed while meds were held for NPO status and procedures Cardiology increased rate control agents, now HR controlled.   Lopressor has intermittently been held due to low HR, stopped and later resumed  -- Monitor heart rates closely and back off rate control agents if persistently bradycardic --Continue Cardizem 240 mg  daily -- Metoprolol 50 mg twice daily -- Eliquis resumed evening of 11/21    (held due to GI bleeding, and 3 days post colonoscopy due to polypectomies)   Hypertension - BP's now elevated. --Continue Cardizem 240 mg daily --Lopressor d/c'd due to bradycardia --Continue hydralazine 100 mg q8h, increased from 50 --Resumed home clonidine nightly --Resumed on Bumex --Add spironolactone 25 mg daily for BP and NASH cirrhosis --PRN IV hydralazine for SBP>180 --allow BP to run on the high side due to GI bleed   Thrombocytopenia - HIT Ab negative.  Likely due to chronic liver disease. Platelets have been overall stable, slightly decreased into the 60s to 70 last few days -- Monitor closely now that Eliquis has been resumed --monitor CBC   Dysphagia - 2/2 stroke --SLP following, seen on 11/12 --on Dysphagia 3 diet, thin liquid   AKI on CKD IV - Baseline creatinine around 3.2.   CKD secondary to diabetes.  AKI secondary to ATN. --Nephrology following --hold home lisinopril  --Resumed on Bumex 4 mg daily (11/19)   Elevated Troponin - due to demand ischemia.  --Cardiology following   Hx NASH cirrhosis -overall compensated at this time, but monitor closely for third spacing and edema.  Appears to be on Bumex 4 mg twice daily at home, without spironolactone.   -- Resumed on Bumex, dosing per nephrology  --Strict I/O's Daily weights -monitor volume status closely   Hypothyroidism -  Synthroid   Type 2 diabetes -controlled, last A1c 6.3%. Glucose has been inpatient goal or lower --no need for CBGs and SSI --Monitor fasting glucose with BMP  Constipation -resolved with scheduled bowel regimen. --Changed MiraLAX and senna to as needed  Obesity: Body mass index is 34.52 kg/m.  Complicates overall care and prognosis.  Recommend lifestyle modifications including physical activity and diet for weight loss and overall long-term health.   DVT prophylaxis: Place and maintain sequential  compression device Start: 11/15/20 1527 apixaban (ELIQUIS) tablet 5 mg   Diet:  Diet Orders (From admission, onward)     Start     Ordered   11/29/20 1751  DIET DYS 3 Room service appropriate? Yes; Fluid consistency: Thin  Diet effective now       Question Answer Comment  Room service appropriate? Yes   Fluid consistency: Thin      11/29/20 1756              Code Status: Full Code   Subjective 12/03/20    Patient was awake resting in bed when seen today.  She reports feeling quite sad and wanting to visit with her dog that she can refers to as her baby.  Overall just feels down regarding her health status.  Denies SI.  Denies any chest pain, shortness of breath, fever chills, nausea vomiting or other acute complaints.  Diarrhea from scheduled laxatives resolved with Imodium yesterday.   Disposition Plan & Communication   Status is: Inpatient  Remains inpatient appropriate because: Severity of illness.  Monitoring hemoglobin with resumption of anticoagulation in the setting of recent GI bleed. Dispo: SNF placement pending.    Consults, Procedures, Significant Events   Consultants:  Gastroenterology Nephrology Infectious disease Cardiology Podiatry Palliative care Neurology  Procedures:  Left foot second ray amputation 11/1 EGD 11/16  Antimicrobials:  Anti-infectives (From admission, onward)    Start     Dose/Rate Route Frequency Ordered Stop   11/30/20 1400  fluconazole (DIFLUCAN) IVPB 200 mg        200 mg 100 mL/hr over 60 Minutes Intravenous Every 24 hours 11/29/20 1633 12/13/20 1359   11/29/20 1800  fluconazole (DIFLUCAN) IVPB 400 mg        400 mg 100 mL/hr over 120 Minutes Intravenous  Once 11/29/20 1633 11/30/20 0724   11/29/20 1700  fluconazole (DIFLUCAN) IVPB 200 mg  Status:  Discontinued        200 mg 100 mL/hr over 60 Minutes Intravenous Every 24 hours 11/29/20 1611 11/29/20 1633   11/23/20 0000  ceFAZolin (ANCEF) IVPB 1 g/50 mL premix       Note  to Pharmacy: To be given in specials   1 g 100 mL/hr over 30 Minutes Intravenous  Once 11/22/20 1106 11/22/20 1750   11/14/20 2200  Ampicillin-Sulbactam (UNASYN) 3 g in sodium chloride 0.9 % 100 mL IVPB        3 g 200 mL/hr over 30 Minutes Intravenous Every 12 hours 11/14/20 1610     11/14/20 0900  piperacillin-tazobactam (ZOSYN) IVPB 2.25 g  Status:  Discontinued        2.25 g 100 mL/hr over 30 Minutes Intravenous Every 8 hours 11/14/20 0805 11/14/20 1609   11/13/20 2200  piperacillin-tazobactam (ZOSYN) IVPB 3.375 g  Status:  Discontinued        3.375 g 12.5 mL/hr over 240 Minutes Intravenous Every 12 hours 11/13/20 0758 11/14/20 0805   11/13/20 0600  piperacillin-tazobactam (ZOSYN) IVPB 2.25 g  2.25 g 100 mL/hr over 30 Minutes Intravenous Every 8 hours 11/12/20 2155 11/13/20 1725   11/11/20 1600  vancomycin (VANCOCIN) IVPB 1000 mg/200 mL premix  Status:  Discontinued        1,000 mg 200 mL/hr over 60 Minutes Intravenous Every 48 hours 11/09/20 1618 11/10/20 1045   11/11/20 1558  vancomycin (VANCOCIN) powder  Status:  Discontinued          As needed 11/11/20 1558 11/11/20 1558   11/10/20 2000  vancomycin (VANCOCIN) IVPB 1000 mg/200 mL premix  Status:  Discontinued        1,000 mg 200 mL/hr over 60 Minutes Intravenous Every 48 hours 11/10/20 1918 11/12/20 0940   11/09/20 1800  ceFEPIme (MAXIPIME) 2 g in sodium chloride 0.9 % 100 mL IVPB  Status:  Discontinued        2 g 200 mL/hr over 30 Minutes Intravenous Every 24 hours 11/09/20 1619 11/12/20 2146   11/09/20 1619  vancomycin variable dose per unstable renal function (pharmacist dosing)  Status:  Discontinued         Does not apply See admin instructions 11/09/20 1619 11/12/20 1521   11/09/20 1615  vancomycin (VANCOREADY) IVPB 1750 mg/350 mL        1,750 mg 175 mL/hr over 120 Minutes Intravenous  Once 11/09/20 1606 11/09/20 1947   11/09/20 1600  metroNIDAZOLE (FLAGYL) IVPB 500 mg  Status:  Discontinued        500 mg 100 mL/hr  over 60 Minutes Intravenous Every 8 hours 11/09/20 1555 11/12/20 2146         Micro    Objective   Vitals:   12/03/20 0732 12/03/20 0816 12/03/20 1129 12/03/20 1147  BP: (!) 158/79  (!) 170/92 (!) 169/79  Pulse: 61  (!) 56 (!) 55  Resp: 20  20 20   Temp: 98.7 F (37.1 C)  98.7 F (37.1 C) 98.4 F (36.9 C)  TempSrc:      SpO2: 96% 94% 96% 97%  Weight:      Height:        Intake/Output Summary (Last 24 hours) at 12/03/2020 1423 Last data filed at 12/03/2020 1100 Gross per 24 hour  Intake 949.54 ml  Output --  Net 949.54 ml   Filed Weights   12/01/20 0500 12/02/20 0600 12/03/20 0500  Weight: 94.3 kg 97.8 kg 97 kg    Physical Exam:  General exam: Awake, resting in bed, alert, no acute distress, obese Respiratory system: Lungs clear bilaterally, normal respiratory effort, on room air Cardiovascular system: Regular rate and rhythm, normal S1-S2, no lower extremity edema Central nervous system: A&O x3. dysarthria stable and improving, otherwise grossly nonfocal exam Gastrointestinal: Abdomen soft and nontender with present bowel sounds Extremities: Left foot wound VAC in place, left foot gauze dressing in place Psychiatry: normal mood, congruent affect, judgement and insight appear normal  Labs   Data Reviewed: I have personally reviewed following labs and imaging studies  CBC: Recent Labs  Lab 11/28/20 0556 11/29/20 0538 11/30/20 0349 12/02/20 0548 12/03/20 0401  WBC 7.5 6.2 4.4 4.0 3.6*  HGB 9.1* 8.6* 8.3* 8.8* 7.7*  HCT 29.2* 27.4* 27.0* 27.5* 23.7*  MCV 97.0 95.5 96.8 94.2 94.0  PLT 80* 71* 64* 70* 63*   Basic Metabolic Panel: Recent Labs  Lab 11/27/20 0501 11/28/20 0556 11/29/20 0538 11/30/20 0349 12/02/20 0548 12/03/20 0401  NA 140 141 137 140 139 139  K 4.1 4.2 3.7 3.7 3.8 3.5  CL 107 109 104 107  104 107  CO2 25 22 23 24 25 25   GLUCOSE 90 91 95 94 113* 105*  BUN 55* 48* 45* 44* 49* 50*  CREATININE 3.66* 3.53* 3.52* 3.46* 3.53* 3.54*   CALCIUM 8.5* 8.2* 8.2* 8.3* 8.5* 8.3*  MG 1.8 1.9 1.8 2.0  --   --    GFR: Estimated Creatinine Clearance: 17.9 mL/min (A) (by C-G formula based on SCr of 3.54 mg/dL (H)). Liver Function Tests: No results for input(s): AST, ALT, ALKPHOS, BILITOT, PROT, ALBUMIN in the last 168 hours. No results for input(s): LIPASE, AMYLASE in the last 168 hours. No results for input(s): AMMONIA in the last 168 hours. Coagulation Profile: No results for input(s): INR, PROTIME in the last 168 hours.  Cardiac Enzymes: No results for input(s): CKTOTAL, CKMB, CKMBINDEX, TROPONINI in the last 168 hours. BNP (last 3 results) No results for input(s): PROBNP in the last 8760 hours. HbA1C: No results for input(s): HGBA1C in the last 72 hours. CBG: Recent Labs  Lab 11/27/20 1054 11/29/20 1524 11/29/20 2140  GLUCAP 98 98 105*   Lipid Profile: No results for input(s): CHOL, HDL, LDLCALC, TRIG, CHOLHDL, LDLDIRECT in the last 72 hours. Thyroid Function Tests: No results for input(s): TSH, T4TOTAL, FREET4, T3FREE, THYROIDAB in the last 72 hours. Anemia Panel: No results for input(s): VITAMINB12, FOLATE, FERRITIN, TIBC, IRON, RETICCTPCT in the last 72 hours. Sepsis Labs: No results for input(s): PROCALCITON, LATICACIDVEN in the last 168 hours.   Recent Results (from the past 240 hour(s))  KOH prep     Status: None   Collection Time: 11/27/20 11:40 AM   Specimen: Esophagus  Result Value Ref Range Status   Specimen Description ESOPHAGUS  Final   Special Requests Normal  Final   KOH Prep   Final    YEAST PRESENT Performed at [facility], [address] [place], [place] [address]    Report Status 11/27/2020 FINAL  Final      Imaging Studies   No results found.   Medications   Scheduled Meds:  (feeding supplement) PROSource Plus  30 mL Oral TID BM   sodium chloride   Intravenous Once   allopurinol  50 mg Oral QODAY   apixaban  5 mg Oral BID   vitamin C  500 mg Oral BID    atorvastatin  40 mg Oral Daily   budesonide (PULMICORT) nebulizer solution  0.5 mg Nebulization BID   bumetanide  4 mg Oral Daily   cloNIDine  0.3 mg Oral QHS   diltiazem  240 mg Oral Daily   diltiazem  10 mg Intravenous Once   feeding supplement  1 Container Oral TID BM   ferrous gluconate  324 mg Oral Q breakfast   hydrALAZINE  100 mg Oral Q8H   levothyroxine  100 mcg Oral Q0600   mouth rinse  15 mL Mouth Rinse BID   metoprolol tartrate  50 mg Oral BID   multivitamin with minerals  1 tablet Oral Daily   pantoprazole  40 mg Oral BID   sodium chloride flush  3 mL Intravenous Q12H   sodium chloride flush  3 mL Intravenous Q12H   spironolactone  25 mg Oral Daily   venlafaxine  50 mg Oral BID   Continuous Infusions:  sodium chloride Stopped (12/01/20 2133)   sodium chloride 250 mL (11/26/20 0942)   sodium chloride 250 mL (11/25/20 1208)   ampicillin-sulbactam (UNASYN) IV 3 g (12/03/20 0954)   fluconazole (DIFLUCAN) IV Stopped (12/02/20 1459)  LOS: 24 days    Time spent: 25 minutes with > 50% spent at bedside and in coordination of care     Ezekiel Slocumb, DO [facility]  12/03/2020, 2:23 PM      If 7PM-7AM, please contact night-coverage. How to contact the [facility] Attending or Consulting provider [place] or covering provider during after hours [place], for this patient?    Check the care team in [facility] and look for a) attending/consulting [facility] provider listed and b) the [facility] team listed Log into [email or web address] and use 's universal password to access. If you do not have the password, please contact the hospital operator. Locate the [facility] provider you are looking for under [facility] and page to a number that you can be directly reached. If you still have difficulty reaching the provider, please page the [facility] (Director on Call) for the Hospitalists listed on amion for assistance.

## 2020-12-03 NOTE — Progress Notes (Addendum)
PT Cancellation Note  Patient Details Name: Autumn [name] MRN: [id number] DOB: 1950/12/18   Cancelled Treatment:    Reason Eval/Treat Not Completed: Other (comment). Treatment attempted, however pt distraught and reports she is depressed. Asked to re-attempt later today.   Addendum: 7530- re-attempted and pt reports she is very sorry but she is very depressed today and sad she can't see her dog. She reports that she doesn't want any therapy today but is willing to try tomorrow to get up. Offered active listening and empathy. Will continue to follow.   Clevester Helzer 12/03/2020, 11:37 AM Greggory Stallion, PT, DPT [phone]

## 2020-12-03 NOTE — Consult Note (Signed)
Pharmacy Antibiotic Note  Autumn [name] is a 70 y.o. female admitted on 11/09/2020 with  osteomyelitis .  Pharmacy has been consulted for Unasyn dosing. ID following.   Presented with left foot draining. PMH includes liver cirrhosis d/t NASH, HTN, thyroid disease, DM. MRI of foot c/w osteomyelitis and abscess. Chest CT c/f PNA, covered by current abx.   After cultures of left foot abscess resulted, narrowed to Unasyn as per discussion with ID.  Scr consistent since 11/18- 3.3-3.55. Has remained afebrile.   Plan: Ampicillin/sulbactam 3 grams every 12 hours Planning for  IV LOT 4wks per ID rec with stop 11/28. Then plans to be on amoxicillin/clavulanate from 11/29-12/12  Monitor renal function, clinical course, LOT.   Height: 5\' 6"  (167.6 cm) Weight: 97 kg (213 lb 13.5 oz) IBW/kg (Calculated) : 59.3  Temp (24hrs), Avg:98.4 F (36.9 C), Min:97.8 F (36.6 C), Max:98.7 F (37.1 C)  Recent Labs  Lab 11/28/20 0556 11/29/20 0538 11/30/20 0349 12/02/20 0548 12/03/20 0401  WBC 7.5 6.2 4.4 4.0 3.6*  CREATININE 3.53* 3.52* 3.46* 3.53* 3.54*     Estimated Creatinine Clearance: 17.9 mL/min (A) (by C-G formula based on SCr of 3.54 mg/dL (H)).    Allergies  Allergen Reactions   Codeine Nausea And Vomiting    Antimicrobials this admission: ongoing 11/3 Unasyn >> Completed 10/29 Vancomycin >> 10/31 10/29 Cefepime >> 11/01 10/29 Metronidazole >> 11/01 11/02 Zosyn >> 11/3  Microbiology results: 10/30 MRSA PCR: (-) 10/31 Wound (2nd metatarsal): strep mitis/oralis, pasteurella, MSSA, bacteroides 10/31 Wound (metatarsal): bacteroides, peptostreptococcus micros 10/31 Abscess (left foot): MSSA, pasteurella 11/2 Bcx Ng5d (final)  Thank you for allowing pharmacy to be a part of this patient's care.  Wynelle Cleveland, PharmD Pharmacy Resident  12/03/2020 12:09 PM

## 2020-12-04 ENCOUNTER — Encounter: Payer: Self-pay | Admitting: Internal Medicine

## 2020-12-04 DIAGNOSIS — M86172 Other acute osteomyelitis, left ankle and foot: Secondary | ICD-10-CM | POA: Diagnosis not present

## 2020-12-04 LAB — CBC
HCT: 26.4 % — ABNORMAL LOW (ref 36.0–46.0)
Hemoglobin: 8.5 g/dL — ABNORMAL LOW (ref 12.0–15.0)
MCH: 29.8 pg (ref 26.0–34.0)
MCHC: 32.2 g/dL (ref 30.0–36.0)
MCV: 92.6 fL (ref 80.0–100.0)
Platelets: 84 10*3/uL — ABNORMAL LOW (ref 150–400)
RBC: 2.85 MIL/uL — ABNORMAL LOW (ref 3.87–5.11)
RDW: 18.1 % — ABNORMAL HIGH (ref 11.5–15.5)
WBC: 3.9 10*3/uL — ABNORMAL LOW (ref 4.0–10.5)
nRBC: 0 % (ref 0.0–0.2)

## 2020-12-04 LAB — BASIC METABOLIC PANEL
Anion gap: 7 (ref 5–15)
BUN: 53 mg/dL — ABNORMAL HIGH (ref 8–23)
CO2: 24 mmol/L (ref 22–32)
Calcium: 8.2 mg/dL — ABNORMAL LOW (ref 8.9–10.3)
Chloride: 107 mmol/L (ref 98–111)
Creatinine, Ser: 3.57 mg/dL — ABNORMAL HIGH (ref 0.44–1.00)
GFR, Estimated: 13 mL/min — ABNORMAL LOW (ref >60)
Glucose, Bld: 122 mg/dL — ABNORMAL HIGH (ref 70–99)
Potassium: 3.6 mmol/L (ref 3.5–5.1)
Sodium: 138 mmol/L (ref 135–145)

## 2020-12-04 LAB — RESP PANEL BY RT-PCR (FLU A&B, COVID) ARPGX2
Influenza A by PCR: NEGATIVE
Influenza B by PCR: NEGATIVE
SARS Coronavirus 2 by RT PCR: NEGATIVE

## 2020-12-04 MED ORDER — AMLODIPINE BESYLATE 5 MG PO TABS
5.0000 mg | ORAL_TABLET | Freq: Every day | ORAL | Status: DC
  Administered 2020-12-04 – 2020-12-05 (×2): 5 mg via ORAL
  Filled 2020-12-04 (×2): qty 1

## 2020-12-04 NOTE — NC FL2 (Addendum)
[place] LEVEL OF CARE SCREENING TOOL     IDENTIFICATION  Patient Name: VONCILE SCHWARZ Birthdate: 12/16/1950 Sex: female Admission Date (Current Location): 11/09/2020  [place] and [place] Number:  Engineering geologist and Address:  [facility], [address], [place], [place] [address]      Provider Number: [id number]  Attending Physician Name and Address:  Jennye Boroughs, MD  Relative Name and Phone Number:  [name] (friend) [phone]    Current Level of Care: Hospital Recommended Level of Care: [place] Prior Approval Number:    Date Approved/Denied:   PASRR Number: [id number]  Discharge Plan: SNF    Current Diagnoses: Patient Active Problem List   Diagnosis Date Noted   Bilateral carotid artery stenosis    Atrial fibrillation with rapid ventricular response (HCC)    Cellulitis and abscess of foot, except toes    Infectious tenosynovitis    Wheeze    AKI (acute kidney injury) ([place])    Anemia of chronic disease    Hypothyroidism    Atrial fibrillation with RVR ([place]) 11/09/2020   Osteomyelitis of foot, left, acute ([place]) 11/09/2020   CKD stage 4 due to type 2 diabetes mellitus ([place]) 11/09/2020   Liver cirrhosis secondary to NASH (nonalcoholic steatohepatitis) ([place]) 11/09/2020   Hypertension    History of anemia due to CKD    Sepsis ([place])    Frequent falls     Orientation RESPIRATION BLADDER Height & Weight     Self, Time, Situation, Place  O2, Other (Comment) (Nasal Canula 2 L. Bipap QHS: 10/5 at 21%.) Incontinent, External catheter Weight: 210 lb 5.1 oz (95.4 kg) Height:  5\' 6"  (167.6 cm)  BEHAVIORAL SYMPTOMS/MOOD NEUROLOGICAL BOWEL NUTRITION STATUS   (None)  (None) Incontinent Diet (DYS 3)  AMBULATORY STATUS COMMUNICATION OF NEEDS Skin   Extensive Assist Verbally Wound Vac, Skin abrasions, Bruising, Other (Comment), Surgical wounds (Toe amputation, rash. Incision on left foot: Wet to dry  BID and wound vac.)                       Personal Care Assistance Level of Assistance  Bathing, Feeding, Dressing Bathing Assistance: Maximum assistance Feeding assistance: Limited assistance Dressing Assistance: Maximum assistance   Functional Limitations Info  Sight, Hearing, Speech Sight Info: Adequate Hearing Info: Adequate Speech Info: Adequate (Slurred/Dysarthria)    SPECIAL CARE FACTORS FREQUENCY  PT (By licensed PT), OT (By licensed OT), Speech therapy     PT Frequency: 5 x week OT Frequency: 5 x week     Speech Therapy Frequency: 5 x week      Contractures Contractures Info: Not present    Additional Factors Info  Code Status, Allergies Code Status Info: Full code Allergies Info: Codeine           Current Medications (12/04/2020):  This is the current hospital active medication list Current Facility-Administered Medications  Medication Dose Route Frequency Provider Last Rate Last Admin   (feeding supplement) PROSource Plus liquid 30 mL  30 mL Oral TID BM Nicole Kindred A, DO   30 mL at 12/04/20 1054   0.9 %  sodium chloride infusion (Manually program via Guardrails IV Fluids)   Intravenous Once Schnier, Dolores Lory, MD       0.9 %  sodium chloride infusion   Intravenous PRN Schnier, Dolores Lory, MD   Stopped at 12/01/20 2133   0.9 %  sodium chloride infusion  250 mL Intravenous PRN  Schnier, Dolores Lory, MD 10 mL/hr at 11/26/20 0942 250 mL at 11/26/20 0942   0.9 %  sodium chloride infusion  250 mL Intravenous PRN Katha Cabal, MD 10 mL/hr at 11/25/20 1208 250 mL at 11/25/20 1208   acetaminophen (TYLENOL) suppository 650 mg  650 mg Rectal Q6H PRN Schnier, Dolores Lory, MD       acetaminophen (TYLENOL) tablet 650 mg  650 mg Per Tube Q6H PRN Schnier, Dolores Lory, MD       albuterol (PROVENTIL) (2.5 MG/3ML) 0.083% nebulizer solution 2.5 mg  2.5 mg Nebulization Q4H PRN Schnier, Dolores Lory, MD   2.5 mg at 12/03/20 1745   allopurinol (ZYLOPRIM) tablet 50 mg  50 mg  Oral QODAY BeersShanon Brow, RPH   50 mg at 12/04/20 1051   amLODipine (NORVASC) tablet 5 mg  5 mg Oral Daily Jennye Boroughs, MD       Ampicillin-Sulbactam (UNASYN) 3 g in sodium chloride 0.9 % 100 mL IVPB  3 g Intravenous Q12H Schnier, Dolores Lory, MD   Stopped at 12/03/20 2158   apixaban (ELIQUIS) tablet 5 mg  5 mg Oral BID Nicole Kindred A, DO   5 mg at 12/04/20 1048   ascorbic acid (VITAMIN C) tablet 500 mg  500 mg Oral BID Enzo Bi, MD   500 mg at 12/04/20 1049   atorvastatin (LIPITOR) tablet 40 mg  40 mg Oral Daily Theora Gianotti, NP   40 mg at 12/04/20 1048   budesonide (PULMICORT) nebulizer solution 0.5 mg  0.5 mg Nebulization BID Schnier, Dolores Lory, MD   0.5 mg at 12/04/20 2355   bumetanide (BUMEX) tablet 4 mg  4 mg Oral Daily Kolluru, Sarath, MD   4 mg at 12/04/20 1102   cloNIDine (CATAPRES) tablet 0.3 mg  0.3 mg Oral QHS Lorna Dibble, RPH   0.3 mg at 12/03/20 2119   diltiazem (CARDIZEM) injection 10 mg  10 mg Intravenous Once Enzo Bi, MD       diltiazem (CARDIZEM) injection 5 mg  5 mg Intravenous Q2H PRN Schnier, Dolores Lory, MD   5 mg at 11/22/20 1745   docusate (COLACE) 50 MG/5ML liquid 100 mg  100 mg Oral BID PRN Nicole Kindred A, DO       feeding supplement (BOOST / RESOURCE BREEZE) liquid 1 Container  1 Container Oral TID BM Nicole Kindred A, DO   1 Container at 12/04/20 1054   ferrous gluconate (FERGON) tablet 324 mg  324 mg Oral Q breakfast Katha Cabal, MD   324 mg at 12/04/20 7322   fluconazole (DIFLUCAN) IVPB 200 mg  200 mg Intravenous Q24H Nicole Kindred A, DO   Stopped at 12/03/20 1551   hydrALAZINE (APRESOLINE) injection 5 mg  5 mg Intravenous Q6H PRN Katha Cabal, MD   5 mg at 11/22/20 1350   hydrALAZINE (APRESOLINE) tablet 100 mg  100 mg Oral Q8H Nicole Kindred A, DO   100 mg at 12/04/20 0606   levothyroxine (SYNTHROID) tablet 100 mcg  100 mcg Oral Q0600 Enzo Bi, MD   100 mcg at 12/04/20 0606   loperamide (IMODIUM) capsule 2 mg  2 mg Oral  PRN Nicole Kindred A, DO   2 mg at 12/02/20 1555   meclizine (ANTIVERT) tablet 12.5 mg  12.5 mg Per Tube TID PRN Schnier, Dolores Lory, MD       MEDLINE mouth rinse  15 mL Mouth Rinse BID Schnier, Dolores Lory, MD   15 mL at 12/04/20  1052   metoprolol tartrate (LOPRESSOR) tablet 50 mg  50 mg Oral BID Enzo Bi, MD   50 mg at 12/03/20 7530   multivitamin with minerals tablet 1 tablet  1 tablet Oral Daily Schnier, Dolores Lory, MD   1 tablet at 12/04/20 1052   ondansetron (ZOFRAN) tablet 4 mg  4 mg Per Tube Q6H PRN Schnier, Dolores Lory, MD       Or   ondansetron [facility]) injection 4 mg  4 mg Intravenous Q6H PRN Schnier, Dolores Lory, MD   4 mg at 12/02/20 1555   ondansetron (ZOFRAN) injection 4 mg  4 mg Intravenous Q6H PRN Schnier, Dolores Lory, MD       pantoprazole (PROTONIX) EC tablet 40 mg  40 mg Oral BID Dorothe Pea, RPH   40 mg at 12/04/20 1049   polyethylene glycol (MIRALAX / GLYCOLAX) packet 17 g  17 g Oral Daily PRN Nicole Kindred A, DO       sodium chloride flush (NS) 0.9 % injection 3 mL  3 mL Intravenous Q12H Schnier, Dolores Lory, MD   3 mL at 12/03/20 0944   sodium chloride flush (NS) 0.9 % injection 3 mL  3 mL Intravenous PRN Schnier, Dolores Lory, MD       sodium chloride flush (NS) 0.9 % injection 3 mL  3 mL Intravenous Q12H Schnier, Dolores Lory, MD   3 mL at 12/04/20 1053   sodium chloride flush (NS) 0.9 % injection 3 mL  3 mL Intravenous PRN Schnier, Dolores Lory, MD       spironolactone (ALDACTONE) tablet 25 mg  25 mg Oral Daily Nicole Kindred A, DO   25 mg at 12/04/20 1102   venlafaxine (EFFEXOR) tablet 50 mg  50 mg Oral BID Lorna Dibble, RPH   50 mg at 12/04/20 1050     Discharge Medications: Please see discharge summary for a list of discharge medications.  Relevant Imaging Results:  Relevant Lab Results:   Additional Information SS#: [id number]  Candie Chroman, LCSW

## 2020-12-04 NOTE — Progress Notes (Addendum)
Progress Note    Autumn [name]  BZJ:[id number] DOB: [birth year]  DOA: 11/09/2020 PCP: Harlow Ohms, MD      Brief Narrative:    Medical records reviewed and are as summarized below:  Autumn [name] is a 70 y.o. 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  She'd been following with a [facility] podiatrist outpatient and had recently refused surgery.  She'd recently been treated with antibiotics at [facility] for the diabetic foot infection with enterobacter aerogans culture positive.  She was admitted on 10/29 due to falls at home with worsening L foot chronic diabetic ulcer with osteomyelitis of the 2nd and 3rd metatarsals and cellulitis noted on MRI.  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  Her post operative course was complicated by unresponsiveness and neurologic changes. She was found to have a stroke and was intubated for airway protection.  She was extubated 11/3 and transferred to [facility] service on 11/4.  Hospital course further complicated by acute on chronic anemia with melena after starting on anticoagulation, requiring blood transfusions and GI evaluation.       Assessment/Plan:   Principal Problem:   Sepsis ([place]) Active Problems:   Atrial fibrillation with RVR (HCC)   Osteomyelitis of foot, left, acute ([place])   CKD stage 4 due to type 2 diabetes mellitus ([place])   Liver cirrhosis secondary to NASH (nonalcoholic steatohepatitis) ([place])   Hypertension   History of anemia due to CKD   Frequent falls   AKI (acute kidney injury) ([place])   Cellulitis and abscess of foot, except toes   Infectious tenosynovitis   Wheeze   Atrial fibrillation with rapid ventricular response (HCC)   Bilateral carotid artery stenosis   Nutrition Problem: Increased nutrient needs Etiology: wound healing  Signs/Symptoms: estimated  needs   Body mass index is 33.95 kg/m.  (Obesity)  Septic shock from left diabetic foot infection, left foot osteomyelitis and cellulitis, septic arthritis of second MTP joint: S/p amputation ray second left metatarsal, I&D deep abscess multiple fascial planes of the left foot on 11/12/2020.  Continue IV Unasyn through 12/09/2020 followed by oral Augmentin through 12/23/2020.  Continue wound VAC therapy.  Follow-up with ID and podiatrist as an outpatient.  Acute stroke with expressive aphasia and dysphagia: Eliquis was resumed on 12/03/2018.  No bleeding thus far.  Continue Eliquis and statin.  Continue dysphagia 3 diet  Acute GI bleeding/melena on 11/26/2020: EGD on 11/28/2018 showed just an esophageal mucosa, gastritis, erythematous duodenopathy.  Colonoscopy 11/29/2020 showed multiple polyps that were removed but the colon prep was poor.  Continue Protonix.  Esophageal candidiasis: Continue fluconazole for up to 14 to 21 days.  Paroxysmal atrial fibrillation, hypertension: Bradycardia noted on telemetry.  Discontinue long-acting Cardizem.  Continue clonidine,  Metoprolol and Eliquis.  Resume home amlodipine for hypertension.  Acute hypoxic respiratory failure, probable OSA: Continue BiPAP at night  Acute on chronic diastolic CHF: 2D echo showed normal EF, mild LVH, severely dilated left atrium, indeterminate LV diastolic parameters.   Aspiration pneumonia: Completed treatment  Acute metabolic encephalopathy: Improved  Other comorbidities include hypertension, [place] liver cirrhosis, hypothyroidism, type II DM  I was informed by Judson Roch, Education officer, museum that patient could go to SNF today.  Screening COVID test was ordered.  However, I was later informed by Judson Roch that nursing facility cannot accept  the patient today because SNF will have to order wound VAC and BiPAP that  will be uses at the nursing facility.  SNF cannot accept the patient until Monday, 12/09/2020.    Diet Order              DIET DYS 3 Room service appropriate? Yes; Fluid consistency: Thin  Diet effective now                      Consultants: Gastroenterologist Intensivist Neurologist  Procedures: Intubation on 11/13/2020 EGD and colonoscopy  Arch aortogram, selective cannulation of the left common carotid artery Amputation ray second metatarsal and, I&D deep abscess multiple fascial planes, bone biopsy third metatarsal    Medications:    (feeding supplement) PROSource Plus  30 mL Oral TID BM   sodium chloride   Intravenous Once   allopurinol  50 mg Oral QODAY   amLODipine  5 mg Oral Daily   apixaban  5 mg Oral BID   vitamin C  500 mg Oral BID   atorvastatin  40 mg Oral Daily   budesonide (PULMICORT) nebulizer solution  0.5 mg Nebulization BID   bumetanide  4 mg Oral Daily   cloNIDine  0.3 mg Oral QHS   diltiazem  10 mg Intravenous Once   feeding supplement  1 Container Oral TID BM   ferrous gluconate  324 mg Oral Q breakfast   hydrALAZINE  100 mg Oral Q8H   levothyroxine  100 mcg Oral Q0600   mouth rinse  15 mL Mouth Rinse BID   metoprolol tartrate  50 mg Oral BID   multivitamin with minerals  1 tablet Oral Daily   pantoprazole  40 mg Oral BID   sodium chloride flush  3 mL Intravenous Q12H   sodium chloride flush  3 mL Intravenous Q12H   spironolactone  25 mg Oral Daily   venlafaxine  50 mg Oral BID   Continuous Infusions:  sodium chloride Stopped (12/01/20 2133)   sodium chloride 250 mL (11/26/20 0942)   sodium chloride 250 mL (11/25/20 1208)   ampicillin-sulbactam (UNASYN) IV Stopped (12/03/20 2158)   fluconazole (DIFLUCAN) IV Stopped (12/03/20 1551)     Anti-infectives (From admission, onward)    Start     Dose/Rate Route Frequency Ordered Stop   11/30/20 1400  fluconazole (DIFLUCAN) IVPB 200 mg        200 mg 100 mL/hr over 60 Minutes Intravenous Every 24 hours 11/29/20 1633 12/13/20 1359   11/29/20 1800  fluconazole (DIFLUCAN) IVPB 400 mg        400 mg 100 mL/hr  over 120 Minutes Intravenous  Once 11/29/20 1633 11/30/20 0724   11/29/20 1700  fluconazole (DIFLUCAN) IVPB 200 mg  Status:  Discontinued        200 mg 100 mL/hr over 60 Minutes Intravenous Every 24 hours 11/29/20 1611 11/29/20 1633   11/23/20 0000  ceFAZolin (ANCEF) IVPB 1 g/50 mL premix       Note to Pharmacy: To be given in specials   1 g 100 mL/hr over 30 Minutes Intravenous  Once 11/22/20 1106 11/22/20 1750   11/14/20 2200  Ampicillin-Sulbactam (UNASYN) 3 g in sodium chloride 0.9 % 100 mL IVPB        3 g 200 mL/hr over 30 Minutes Intravenous Every 12 hours 11/14/20 1610     11/14/20 0900  piperacillin-tazobactam (ZOSYN) IVPB 2.25 g  Status:  Discontinued        2.25 g 100  mL/hr over 30 Minutes Intravenous Every 8 hours 11/14/20 0805 11/14/20 1609   11/13/20 2200  piperacillin-tazobactam (ZOSYN) IVPB 3.375 g  Status:  Discontinued        3.375 g 12.5 mL/hr over 240 Minutes Intravenous Every 12 hours 11/13/20 0758 11/14/20 0805   11/13/20 0600  piperacillin-tazobactam (ZOSYN) IVPB 2.25 g        2.25 g 100 mL/hr over 30 Minutes Intravenous Every 8 hours 11/12/20 2155 11/13/20 1725   11/11/20 1600  vancomycin (VANCOCIN) IVPB 1000 mg/200 mL premix  Status:  Discontinued        1,000 mg 200 mL/hr over 60 Minutes Intravenous Every 48 hours 11/09/20 1618 11/10/20 1045   11/11/20 1558  vancomycin (VANCOCIN) powder  Status:  Discontinued          As needed 11/11/20 1558 11/11/20 1558   11/10/20 2000  vancomycin (VANCOCIN) IVPB 1000 mg/200 mL premix  Status:  Discontinued        1,000 mg 200 mL/hr over 60 Minutes Intravenous Every 48 hours 11/10/20 1918 11/12/20 0940   11/09/20 1800  ceFEPIme (MAXIPIME) 2 g in sodium chloride 0.9 % 100 mL IVPB  Status:  Discontinued        2 g 200 mL/hr over 30 Minutes Intravenous Every 24 hours 11/09/20 1619 11/12/20 2146   11/09/20 1619  vancomycin variable dose per unstable renal function (pharmacist dosing)  Status:  Discontinued         Does not apply  See admin instructions 11/09/20 1619 11/12/20 1521   11/09/20 1615  vancomycin (VANCOREADY) IVPB 1750 mg/350 mL        1,750 mg 175 mL/hr over 120 Minutes Intravenous  Once 11/09/20 1606 11/09/20 1947   11/09/20 1600  metroNIDAZOLE (FLAGYL) IVPB 500 mg  Status:  Discontinued        500 mg 100 mL/hr over 60 Minutes Intravenous Every 8 hours 11/09/20 1555 11/12/20 2146              Family Communication/Anticipated D/C date and plan/Code Status   DVT prophylaxis: Place and maintain sequential compression device Start: 11/15/20 1527 apixaban (ELIQUIS) tablet 5 mg     Code Status: Full Code  Family Communication: None Disposition Plan: Plan to discharge to SNF on Monday, 12/09/2020   Status is: Inpatient  Remains inpatient appropriate because: Awaiting placement to SNF           Subjective:   Interval events noted.  No rectal bleeding or hematemesis.  Objective:    Vitals:   12/04/20 0825 12/04/20 1030 12/04/20 1058 12/04/20 1146  BP:   (!) 174/78 (!) 171/92  Pulse:  (!) 57 (!) 57 (!) 51  Resp:   18 17  Temp:    98.1 F (36.7 C)  TempSrc:    Oral  SpO2: 93%  96% 97%  Weight:      Height:       No data found.   Intake/Output Summary (Last 24 hours) at 12/04/2020 1528 Last data filed at 12/04/2020 1146 Gross per 24 hour  Intake 540 ml  Output 1000 ml  Net -460 ml   Filed Weights   12/02/20 0600 12/03/20 0500 12/04/20 0418  Weight: 97.8 kg 97 kg 95.4 kg    Exam:  GEN: NAD SKIN: Warm and dry EYES: No pallor or icterus ENT: MMM CV: RRR PULM: CTA B ABD: soft, obese, NT, +BS CNS: AAO x 3, non focal EXT: Left leg edema without erythema or tenderness.  Left  second toe amputee.        Data Reviewed:   I have personally reviewed following labs and imaging studies:  Labs: Labs show the following:   Basic Metabolic Panel: Recent Labs  Lab 11/28/20 0556 11/29/20 0538 11/30/20 0349 12/02/20 0548 12/03/20 0401 12/04/20 0416  NA  141 137 140 139 139 138  K 4.2 3.7 3.7 3.8 3.5 3.6  CL 109 104 107 104 107 107  CO2 22 23 24 25 25 24   GLUCOSE 91 95 94 113* 105* 122*  BUN 48* 45* 44* 49* 50* 53*  CREATININE 3.53* 3.52* 3.46* 3.53* 3.54* 3.57*  CALCIUM 8.2* 8.2* 8.3* 8.5* 8.3* 8.2*  MG 1.9 1.8 2.0  --   --   --    GFR Estimated Creatinine Clearance: 17.5 mL/min (A) (by C-G formula based on SCr of 3.57 mg/dL (H)). Liver Function Tests: No results for input(s): AST, ALT, ALKPHOS, BILITOT, PROT, ALBUMIN in the last 168 hours. No results for input(s): LIPASE, AMYLASE in the last 168 hours. No results for input(s): AMMONIA in the last 168 hours. Coagulation profile No results for input(s): INR, PROTIME in the last 168 hours.  CBC: Recent Labs  Lab 11/29/20 0538 11/30/20 0349 12/02/20 0548 12/03/20 0401 12/04/20 0416  WBC 6.2 4.4 4.0 3.6* 3.9*  HGB 8.6* 8.3* 8.8* 7.7* 8.5*  HCT 27.4* 27.0* 27.5* 23.7* 26.4*  MCV 95.5 96.8 94.2 94.0 92.6  PLT 71* 64* 70* 63* 84*   Cardiac Enzymes: No results for input(s): CKTOTAL, CKMB, CKMBINDEX, TROPONINI in the last 168 hours. BNP (last 3 results) No results for input(s): PROBNP in the last 8760 hours. CBG: Recent Labs  Lab 11/29/20 1524 11/29/20 2140  GLUCAP 98 105*   D-Dimer: No results for input(s): DDIMER in the last 72 hours. Hgb A1c: No results for input(s): HGBA1C in the last 72 hours. Lipid Profile: No results for input(s): CHOL, HDL, LDLCALC, TRIG, CHOLHDL, LDLDIRECT in the last 72 hours. Thyroid function studies: No results for input(s): TSH, T4TOTAL, T3FREE, THYROIDAB in the last 72 hours.  Invalid input(s): FREET3 Anemia work up: No results for input(s): VITAMINB12, FOLATE, FERRITIN, TIBC, IRON, RETICCTPCT in the last 72 hours. Sepsis Labs: Recent Labs  Lab 11/30/20 0349 12/02/20 0548 12/03/20 0401 12/04/20 0416  WBC 4.4 4.0 3.6* 3.9*    Microbiology Recent Results (from the past 240 hour(s))  KOH prep     Status: None   Collection Time:  11/27/20 11:40 AM   Specimen: Esophagus  Result Value Ref Range Status   Specimen Description ESOPHAGUS  Final   Special Requests Normal  Final   KOH Prep   Final    YEAST PRESENT Performed at [facility], [address] [place], [place] [address]    Report Status 11/27/2020 FINAL  Final  Resp Panel by RT-PCR (Flu A&B, Covid) Nasopharyngeal Swab     Status: None   Collection Time: 12/04/20 11:07 AM   Specimen: Nasopharyngeal Swab; Nasopharyngeal(NP) swabs in vial transport medium  Result Value Ref Range Status   SARS Coronavirus 2 by RT PCR NEGATIVE NEGATIVE Final    Comment: (NOTE) SARS-CoV-2 target nucleic acids are NOT DETECTED.  The SARS-CoV-2 RNA is generally detectable in upper respiratory specimens during the acute phase of infection. The lowest concentration of SARS-CoV-2 viral copies this assay can detect is 138 copies/mL. A negative result does not preclude SARS-Cov-2 infection and should not be used as the sole basis for treatment or other patient management decisions. A negative  result may occur with  improper specimen collection/handling, submission of specimen other than nasopharyngeal swab, presence of viral mutation(s) within the areas targeted by this assay, and inadequate number of viral copies(<138 copies/mL). A negative result must be combined with clinical observations, patient history, and epidemiological information. The expected result is Negative.  Fact Sheet for Patients:  EntrepreneurPulse.com.au  Fact Sheet for Healthcare Providers:  IncredibleEmployment.be  This test is no t yet approved or cleared by the [place] FDA and  has been authorized for detection and/or diagnosis of SARS-CoV-2 by FDA under an Emergency Use Authorization (EUA). This EUA will remain  in effect (meaning this test can be used) for the duration of the COVID-19 declaration under Section 564(b)(1) of the Act, 21 U.S.C.section  360bbb-3(b)(1), unless the authorization is terminated  or revoked sooner.       Influenza A by PCR NEGATIVE NEGATIVE Final   Influenza B by PCR NEGATIVE NEGATIVE Final    Comment: (NOTE) The Xpert Xpress SARS-CoV-2/FLU/RSV plus assay is intended as an aid in the diagnosis of influenza from Nasopharyngeal swab specimens and should not be used as a sole basis for treatment. Nasal washings and aspirates are unacceptable for Xpert Xpress SARS-CoV-2/FLU/RSV testing.  Fact Sheet for Patients: EntrepreneurPulse.com.au  Fact Sheet for Healthcare Providers: IncredibleEmployment.be  This test is not yet approved or cleared by the [place] FDA and has been authorized for detection and/or diagnosis of SARS-CoV-2 by FDA under an Emergency Use Authorization (EUA). This EUA will remain in effect (meaning this test can be used) for the duration of the COVID-19 declaration under Section 564(b)(1) of the Act, 21 U.S.C. section 360bbb-3(b)(1), unless the authorization is terminated or revoked.  Performed at [facility], [address] [place], [place] [address]     Procedures and diagnostic studies:  No results found.             LOS: 25 days   [place] Copywriter, advertising on [email or web address]. If 7PM-7AM, please contact night-coverage at [email or web address]     12/04/2020, 3:28 PM

## 2020-12-04 NOTE — Progress Notes (Signed)
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 Other comorbidities include sepsis without shock secondary to osteomyelitis and cellulitis left foot (IV zosyn), AKI on CKD, rapid afib (Cardizem + Heparin gtt), NSTEMI suspect demand ischemia, anemia acute on chronic. Transferred to ICU and intubated 11/2 after evolving neuro changes with unresponsiveness after coughing episode. 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   OT comments  Autumn [name] was seen for OT treatment on this date. Upon arrival to room pt reclined in chair, RN at bedside removing IV. Pt agreeable to tx, requesting to return to bed. NWBing precautions reviewed and positioned for trasnfer when pt noted to have bleeding at recently removed IV site - pressure applied and RN called in to room to assess, reinforced dressing applied. Pt requires MIN A don/doff gown seated in chair. MAX A don/doff socks. MAX A x2 for chair>bed t/f, +2 to maintain LLE NWBing. Pt continues to have difficiulty sequencing lateral scoot transfer, improved sequencing for squat pivot t/f. Pt left in bed with LUE elevated. Pt making good progress toward goals. Pt continues to benefit from skilled OT services to maximize return to PLOF and minimize risk of future falls, injury, caregiver burden, and readmission. Will continue to follow POC. Discharge recommendation remains appropriate.     Recommendations for follow up therapy are one component of a  multi-disciplinary discharge planning process, led by the attending physician.  Recommendations may be updated based on patient status, additional functional criteria and insurance authorization.    Follow Up Recommendations  Skilled nursing-short term rehab (<3 hours/day)    Assistance Recommended at Discharge Frequent or constant Supervision/Assistance  Equipment Recommendations  Other (comment) (defer to next venue of care)    Recommendations for Other Services      Precautions / Restrictions Precautions Precautions: Fall Restrictions Weight Bearing Restrictions: Yes LLE Weight Bearing: Non weight bearing Other Position/Activity Restrictions: wound vac       Mobility Bed Mobility Overal bed mobility: Needs Assistance Bed Mobility: Sit to Supine     Supine to sit: Modified independent (Device/Increase time) Sit to supine: Min guard   General bed mobility comments: cues for NWBing    Transfers Overall transfer level: Needs assistance Equipment used: None         Squat pivot transfers: Max assist;+2 physical assistance    Lateral/Scoot Transfers: Mod assist General transfer comment: +2 for NWBing     Balance Overall balance assessment: Needs assistance Sitting-balance support: Feet supported Sitting balance-Leahy Scale: Good       Standing balance-Leahy Scale: Zero Standing balance comment: not attempted                           ADL either performed or assessed with clinical judgement   ADL Overall ADL's : Needs assistance/impaired                                       General  ADL Comments: MIN A don/doff gown seated in chair. MAX A don/doff socks. MAX A x2 for chair>bed t/f, +2 to maintain LLE NWBing. Pt continues to have difficiulty sequencing lateral scoot transfer, improved sequencing for squat pivot t/f.      Cognition Arousal/Alertness: Awake/alert Behavior During Therapy: WFL for tasks assessed/performed Overall  Cognitive Status: Within Functional Limits for tasks assessed Area of Impairment: Problem solving                             Problem Solving: Requires verbal cues General Comments: good memory and command following however difficulty maintaining NWBing pcns and sequencing transfer          Exercises Exercises: Other exercises Other Exercises Other Exercises: Pt educ re: NWB precautions, d/c recs, importance of movement for functional mobility Other Exercises: LBD< UBD< sit>sup, SPT, sitting balance/tolerance   Shoulder Instructions       General Comments bleeding noted to revently removed IV site, RN notified and in room to assess    Pertinent Vitals/ Pain       Pain Assessment: No/denies pain Pain Intervention(s): Monitored during session   Frequency  Min 3X/week        Progress Toward Goals  OT Goals(current goals can now be found in the care plan section)  Progress towards OT goals: Progressing toward goals  Acute Rehab OT Goals OT Goal Formulation: With patient Time For Goal Achievement: 12/13/20 Potential to Achieve Goals: Good ADL Goals Pt Will Perform Grooming: sitting;with set-up Pt Will Perform Lower Body Dressing: with mod assist;sitting/lateral leans Pt Will Transfer to Toilet: bedside commode;with max assist;anterior/posterior transfer Pt/caregiver will Perform Home Exercise Program: Increased ROM;Increased strength;Both right and left upper extremity;With minimal assist Additional ADL Goal #1: x  Plan Discharge plan remains appropriate;Frequency remains appropriate    Co-evaluation                 AM-PAC OT "6 Clicks" Daily Activity     Outcome Measure   Help from another person eating meals?: A Little Help from another person taking care of personal grooming?: A Little Help from another person toileting, which includes using toliet, bedpan, or urinal?: A Lot Help from another person bathing (including washing, rinsing,  drying)?: A Lot Help from another person to put on and taking off regular upper body clothing?: A Little Help from another person to put on and taking off regular lower body clothing?: A Lot 6 Click Score: 15    End of Session Equipment Utilized During Treatment: Gait belt  OT Visit Diagnosis: Other abnormalities of gait and mobility (R26.89);Hemiplegia and hemiparesis;Muscle weakness (generalized) (M62.81) Hemiplegia - Right/Left: Right Hemiplegia - dominant/non-dominant: Dominant Hemiplegia - caused by: Cerebral infarction   Activity Tolerance Patient tolerated treatment well   Patient Left in bed;with call bell/phone within reach;with bed alarm set (LUE elevated on pillow 2/2 recent bleed)   Nurse Communication Mobility status;Other (comment) (pt bleeding from IV site)        Time: 0355-9741 OT Time Calculation (min): 39 min  Charges: OT General Charges $OT Visit: 1 Visit OT Treatments $Self Care/Home Management : 38-52 mins  Dessie Coma, M.S. OTR/L  12/04/20, 3:21 PM  ascom [phone]

## 2020-12-04 NOTE — Progress Notes (Signed)
RN sent Dr. Mal Misty a secure chat and made MD aware that patient's HR is in the mid to upper 50's and that blood pressure is 174/78 and 240 mg Cardizem CD is due. MD discontinued Cardizem and gave order to not give morning dose today. RN also made MD aware that patient's IV is not working and that ampicillin is due. MD gave order to hold off on starting another IV since patient is possibly being discharged to SNF today.

## 2020-12-04 NOTE — Progress Notes (Signed)
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 Other comorbidities include sepsis without shock secondary to osteomyelitis and cellulitis left foot (IV zosyn), AKI on CKD, rapid afib (Cardizem + Heparin gtt), NSTEMI suspect demand ischemia, anemia acute on chronic. Transferred to ICU and intubated 11/2 after evolving neuro changes with unresponsiveness after coughing episode. 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    PT Comments    Patient received in bed, agreeable to getting up to recliner with encouragement. Returned after getting linens and patient sitting up on side of bed with alarm sounding. She reports her bed is wet. Patient needs mod assist to scoot laterally on bed and to recliner. Difficulty maintaining NWB status with mobility. She will continue to benefit from skilled PT while here to improve functional independence.        Recommendations for follow up therapy are one component of a multi-disciplinary discharge planning process, led by the attending physician.  Recommendations may be updated based on patient status, additional functional criteria and insurance authorization.  Follow Up Recommendations  Skilled nursing-short term rehab (<3 hours/day)     Assistance Recommended at Discharge Frequent or constant Supervision/Assistance  Equipment Recommendations  Other (comment) (TBD)    Recommendations for Other Services       Precautions / Restrictions  Precautions Precautions: Fall Restrictions Weight Bearing Restrictions: Yes LLE Weight Bearing: Non weight bearing Other Position/Activity Restrictions: wound vac     Mobility  Bed Mobility Overal bed mobility: Needs Assistance Bed Mobility: Supine to Sit     Supine to sit: Modified independent (Device/Increase time)     General bed mobility comments: Patient got herself up to edge of bed while I was out of room. She needs mod assist to scoot in sitting    Transfers Overall transfer level: Needs assistance Equipment used: None              Lateral/Scoot Transfers: Mod assist General transfer comment: Patient unable to scoot in sitting without mod assist.    Ambulation/Gait               General Gait Details: unable to at this time   Stairs             Wheelchair Mobility    Modified Rankin (Stroke Patients Only)       Balance Overall balance assessment: Needs assistance Sitting-balance support: Feet supported Sitting balance-Leahy Scale: Good       Standing balance-Leahy Scale: Zero Standing balance comment: not attempted                            Cognition Arousal/Alertness: Awake/alert Behavior During Therapy: WFL for tasks assessed/performed Overall Cognitive Status: Within Functional Limits for tasks assessed                                          Exercises      General Comments  Pertinent Vitals/Pain Pain Assessment: No/denies pain Pain Intervention(s): Monitored during session    Home Living                          Prior Function            PT Goals (current goals can now be found in the care plan section) Acute Rehab PT Goals Patient Stated Goal: to go to rehab PT Goal Formulation: With patient Time For Goal Achievement: 12/12/20 Potential to Achieve Goals: Good Additional Goals Additional Goal #1: Pt will maintain NWB precautions without physical assist in  order to allow full healing of L foot Progress towards PT goals: Progressing toward goals    Frequency    Min 2X/week      PT Plan Current plan remains appropriate    Co-evaluation              AM-PAC PT "6 Clicks" Mobility   Outcome Measure  Help needed turning from your back to your side while in a flat bed without using bedrails?: None Help needed moving from lying on your back to sitting on the side of a flat bed without using bedrails?: A Little Help needed moving to and from a bed to a chair (including a wheelchair)?: A Lot Help needed standing up from a chair using your arms (e.g., wheelchair or bedside chair)?: Total Help needed to walk in hospital room?: Total Help needed climbing 3-5 steps with a railing? : Total 6 Click Score: 12    End of Session Equipment Utilized During Treatment: Gait belt Activity Tolerance: Patient tolerated treatment well Patient left: in chair;with call bell/phone within reach Nurse Communication: Mobility status PT Visit Diagnosis: Other abnormalities of gait and mobility (R26.89);Hemiplegia and hemiparesis;Muscle weakness (generalized) (M62.81);History of falling (Z91.81) Hemiplegia - Right/Left: Right Hemiplegia - dominant/non-dominant: Dominant     Time: 1443-1540 PT Time Calculation (min) (ACUTE ONLY): 27 min  Charges:  $Therapeutic Activity: 23-37 mins                     Dany Harten, PT, GCS 12/04/20,2:53 PM

## 2020-12-04 NOTE — Care Management Important Message (Signed)
Important Message  Patient Details  Name: Autumn [name] MRN: [id number] Date of Birth: [birth year]   Medicare Important Message Given:  Yes     Dannette Barbara 12/04/2020, 12:03 PM

## 2020-12-04 NOTE — TOC Progression Note (Signed)
Transition of Care [facility]) - Progression Note    Patient Details  Name: Autumn [name] MRN: [id number] Date of Birth: [birth year]  Transition of Care [facility]) CM/SW [place], LCSW Phone Number: 12/04/2020, 1:00 PM  Clinical Narrative:  Per MD, patient did not know where she was going. Called her and provided update. She is agreeable and confirmed that we needed to facilitate with her friend [name]. Patient just requests she be kept updated. Maple Pauline Aus was not aware patient had a wound vac and was using bipap at night. Updated FL2 to reflect needs. They are ordering DME. Bipap will not be there until Monday. Insurance authorization valid through next Wednesday. RN will notify patient about plan for Monday. Was unable to reach patient on room phone a second time ([place] working remote today). Left voicemail for Ms. Born.  Expected Discharge Plan: [place] Barriers to Discharge: Continued Medical Work up  Expected Discharge Plan and Services Expected Discharge Plan: [place] arrangements for the past 2 months: Single Family Home                                       Social Determinants of Health (SDOH) Interventions    Readmission Risk Interventions No flowsheet data found.

## 2020-12-05 DIAGNOSIS — M86172 Other acute osteomyelitis, left ankle and foot: Secondary | ICD-10-CM | POA: Diagnosis not present

## 2020-12-05 DIAGNOSIS — N179 Acute kidney failure, unspecified: Secondary | ICD-10-CM | POA: Diagnosis not present

## 2020-12-05 DIAGNOSIS — R6520 Severe sepsis without septic shock: Secondary | ICD-10-CM | POA: Diagnosis not present

## 2020-12-05 DIAGNOSIS — A419 Sepsis, unspecified organism: Secondary | ICD-10-CM | POA: Diagnosis not present

## 2020-12-05 MED ORDER — FLUCONAZOLE 100 MG PO TABS
200.0000 mg | ORAL_TABLET | Freq: Every day | ORAL | Status: DC
  Administered 2020-12-05 – 2020-12-08 (×4): 200 mg via ORAL
  Filled 2020-12-05 (×6): qty 2

## 2020-12-05 NOTE — Progress Notes (Signed)
Progress Note    Autumn [name]  ATF:[id number] DOB: Jan 29, 1950  DOA: 11/09/2020 PCP: Harlow Ohms, MD      Brief Narrative:    Medical records reviewed and are as summarized below:  Autumn [name] is a 70 y.o. 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  She'd been following with a [facility] podiatrist outpatient and had recently refused surgery.  She'd recently been treated with antibiotics at [facility] for the diabetic foot infection with enterobacter aerogans culture positive.  She was admitted on 10/29 due to falls at home with worsening L foot chronic diabetic ulcer with osteomyelitis of the 2nd and 3rd metatarsals and cellulitis noted on MRI.  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  Her post operative course was complicated by unresponsiveness and neurologic changes. She was found to have a stroke and was intubated for airway protection.  She was extubated 11/3 and transferred to [facility] service on 11/4.  Hospital course further complicated by acute on chronic anemia with melena after starting on anticoagulation, requiring blood transfusions and GI evaluation.       Assessment/Plan:   Principal Problem:   Sepsis ([place]) Active Problems:   Atrial fibrillation with RVR (HCC)   Osteomyelitis of foot, left, acute ([place])   CKD stage 4 due to type 2 diabetes mellitus ([place])   Liver cirrhosis secondary to NASH (nonalcoholic steatohepatitis) ([place])   Hypertension   History of anemia due to CKD   Frequent falls   AKI (acute kidney injury) ([place])   Cellulitis and abscess of foot, except toes   Infectious tenosynovitis   [place]   Atrial fibrillation with rapid ventricular response (HCC)   Bilateral carotid artery stenosis   Nutrition Problem: Increased nutrient needs Etiology: wound healing  Signs/Symptoms: estimated  needs   Body mass index is 33.88 kg/m.  (Obesity)  Septic shock from left diabetic foot infection, left foot osteomyelitis and cellulitis, septic arthritis of second MTP joint: S/p amputation ray second left metatarsal, I&D deep abscess multiple fascial planes of the left foot on 11/12/2020.  Continue IV Unasyn through 12/09/2020 followed by oral Augmentin through 12/23/2020.  Continue wound VAC therapy.  Outpatient follow-up with ID and podiatrist.  Acute stroke with expressive aphasia and dysphagia: Continue Eliquis and atorvastatin.  Continue dysphagia 3 diet.  Acute GI bleeding/melena on 11/26/2020: EGD on 11/28/2018 showed just an esophageal mucosa, gastritis, erythematous duodenopathy.  Colonoscopy 11/29/2020 showed multiple polyps that were removed but the colon prep was poor.  Continue Protonix.  Esophageal candidiasis: Change IV fluconazole to oral fluconazole and continued treatment until 12/12/2020  Paroxysmal atrial fibrillation, hypertension: She still has episodes of mild bradycardia on telemetry.  Continue Eliquis and antihypertensives.  Thrombocytopenia: Platelet count is stable.  Acute hypoxic respiratory failure, probable OSA: Continue BiPAP at night  Acute on chronic diastolic CHF: 2D echo showed normal EF, mild LVH, severely dilated left atrium, indeterminate LV diastolic parameters.   Aspiration pneumonia: Completed treatment  Acute metabolic encephalopathy: Improved  Other comorbidities include hypertension, NASH liver cirrhosis, hypothyroidism, type II DM     Diet Order             DIET DYS 3 Room service appropriate? Yes; Fluid consistency: Thin  Diet effective now  Consultants: Child psychotherapist  Procedures: Intubation on 11/13/2020 EGD and colonoscopy  Arch aortogram, selective cannulation of the left common carotid artery Amputation ray second metatarsal and, I&D deep abscess multiple fascial  planes, bone biopsy third metatarsal    Medications:    (feeding supplement) PROSource Plus  30 mL Oral TID BM   sodium chloride   Intravenous Once   allopurinol  50 mg Oral QODAY   amLODipine  5 mg Oral Daily   apixaban  5 mg Oral BID   vitamin C  500 mg Oral BID   atorvastatin  40 mg Oral Daily   budesonide (PULMICORT) nebulizer solution  0.5 mg Nebulization BID   bumetanide  4 mg Oral Daily   cloNIDine  0.3 mg Oral QHS   diltiazem  10 mg Intravenous Once   feeding supplement  1 Container Oral TID BM   ferrous gluconate  324 mg Oral Q breakfast   hydrALAZINE  100 mg Oral Q8H   levothyroxine  100 mcg Oral Q0600   mouth rinse  15 mL Mouth Rinse BID   metoprolol tartrate  50 mg Oral BID   multivitamin with minerals  1 tablet Oral Daily   pantoprazole  40 mg Oral BID   sodium chloride flush  3 mL Intravenous Q12H   sodium chloride flush  3 mL Intravenous Q12H   spironolactone  25 mg Oral Daily   venlafaxine  50 mg Oral BID   Continuous Infusions:  sodium chloride 10 mL/hr at 12/05/20 0604   sodium chloride 250 mL (11/26/20 0942)   sodium chloride 250 mL (11/25/20 1208)   ampicillin-sulbactam (UNASYN) IV 3 g (12/05/20 0606)   fluconazole (DIFLUCAN) IV 100 mL/hr at 12/04/20 1800     Anti-infectives (From admission, onward)    Start     Dose/Rate Route Frequency Ordered Stop   11/30/20 1400  fluconazole (DIFLUCAN) IVPB 200 mg        200 mg 100 mL/hr over 60 Minutes Intravenous Every 24 hours 11/29/20 1633 12/13/20 1359   11/29/20 1800  fluconazole (DIFLUCAN) IVPB 400 mg        400 mg 100 mL/hr over 120 Minutes Intravenous  Once 11/29/20 1633 11/30/20 0724   11/29/20 1700  fluconazole (DIFLUCAN) IVPB 200 mg  Status:  Discontinued        200 mg 100 mL/hr over 60 Minutes Intravenous Every 24 hours 11/29/20 1611 11/29/20 1633   11/23/20 0000  ceFAZolin (ANCEF) IVPB 1 g/50 mL premix       Note to Pharmacy: To be given in specials   1 g 100 mL/hr over 30 Minutes Intravenous   Once 11/22/20 1106 11/22/20 1750   11/14/20 2200  Ampicillin-Sulbactam (UNASYN) 3 g in sodium chloride 0.9 % 100 mL IVPB        3 g 200 mL/hr over 30 Minutes Intravenous Every 12 hours 11/14/20 1610     11/14/20 0900  piperacillin-tazobactam (ZOSYN) IVPB 2.25 g  Status:  Discontinued        2.25 g 100 mL/hr over 30 Minutes Intravenous Every 8 hours 11/14/20 0805 11/14/20 1609   11/13/20 2200  piperacillin-tazobactam (ZOSYN) IVPB 3.375 g  Status:  Discontinued        3.375 g 12.5 mL/hr over 240 Minutes Intravenous Every 12 hours 11/13/20 0758 11/14/20 0805   11/13/20 0600  piperacillin-tazobactam (ZOSYN) IVPB 2.25 g        2.25 g 100 mL/hr over 30 Minutes Intravenous Every 8 hours 11/12/20 2155  11/13/20 1725   11/11/20 1600  vancomycin (VANCOCIN) IVPB 1000 mg/200 mL premix  Status:  Discontinued        1,000 mg 200 mL/hr over 60 Minutes Intravenous Every 48 hours 11/09/20 1618 11/10/20 1045   11/11/20 1558  vancomycin (VANCOCIN) powder  Status:  Discontinued          As needed 11/11/20 1558 11/11/20 1558   11/10/20 2000  vancomycin (VANCOCIN) IVPB 1000 mg/200 mL premix  Status:  Discontinued        1,000 mg 200 mL/hr over 60 Minutes Intravenous Every 48 hours 11/10/20 1918 11/12/20 0940   11/09/20 1800  ceFEPIme (MAXIPIME) 2 g in sodium chloride 0.9 % 100 mL IVPB  Status:  Discontinued        2 g 200 mL/hr over 30 Minutes Intravenous Every 24 hours 11/09/20 1619 11/12/20 2146   11/09/20 1619  vancomycin variable dose per unstable renal function (pharmacist dosing)  Status:  Discontinued         Does not apply See admin instructions 11/09/20 1619 11/12/20 1521   11/09/20 1615  vancomycin (VANCOREADY) IVPB 1750 mg/350 mL        1,750 mg 175 mL/hr over 120 Minutes Intravenous  Once 11/09/20 1606 11/09/20 1947   11/09/20 1600  metroNIDAZOLE (FLAGYL) IVPB 500 mg  Status:  Discontinued        500 mg 100 mL/hr over 60 Minutes Intravenous Every 8 hours 11/09/20 1555 11/12/20 2146               Family Communication/Anticipated D/C date and plan/Code Status   DVT prophylaxis: Place and maintain sequential compression device Start: 11/15/20 1527 apixaban (ELIQUIS) tablet 5 mg     Code Status: Full Code  Family Communication: None Disposition Plan: Plan to discharge to SNF on Monday, 12/09/2020   Status is: Inpatient  Remains inpatient appropriate because: Awaiting placement to SNF           Subjective:   No complaints.  No shortness of breath, chest pain, rectal bleeding or hematemesis  Objective:    Vitals:   12/05/20 0318 12/05/20 0614 12/05/20 0719 12/05/20 0737  BP: (!) 165/77   (!) 177/80  Pulse: (!) 58   66  Resp: 16   19  Temp: 98.5 F (36.9 C)   98.7 F (37.1 C)  TempSrc:    Oral  SpO2: 97%  95% 97%  Weight:  95.2 kg    Height:       No data found.   Intake/Output Summary (Last 24 hours) at 12/05/2020 1039 Last data filed at 12/05/2020 1000 Gross per 24 hour  Intake 917.57 ml  Output 1350 ml  Net -432.43 ml   Filed Weights   12/03/20 0500 12/04/20 0418 12/05/20 0614  Weight: 97 kg 95.4 kg 95.2 kg    Exam:  GEN: NAD SKIN: Warm and dry EYES: No pallor or icterus ENT: MMM CV: RRR PULM: CTA B ABD: soft, ND, NT, +BS CNS: AAO x 3, non focal EXT: Left leg edema.  Left second toe amputee.  Left foot wound connected to wound VAC       Data Reviewed:   I have personally reviewed following labs and imaging studies:  Labs: Labs show the following:   Basic Metabolic Panel: Recent Labs  Lab 11/29/20 0538 11/30/20 0349 12/02/20 0548 12/03/20 0401 12/04/20 0416  NA 137 140 139 139 138  K 3.7 3.7 3.8 3.5 3.6  CL 104 107 104 107 107  CO2 23 24 25 25 24   GLUCOSE 95 94 113* 105* 122*  BUN 45* 44* 49* 50* 53*  CREATININE 3.52* 3.46* 3.53* 3.54* 3.57*  CALCIUM 8.2* 8.3* 8.5* 8.3* 8.2*  MG 1.8 2.0  --   --   --    GFR Estimated Creatinine Clearance: 17.5 mL/min (A) (by C-G formula based on SCr of 3.57  mg/dL (H)). Liver Function Tests: No results for input(s): AST, ALT, ALKPHOS, BILITOT, PROT, ALBUMIN in the last 168 hours. No results for input(s): LIPASE, AMYLASE in the last 168 hours. No results for input(s): AMMONIA in the last 168 hours. Coagulation profile No results for input(s): INR, PROTIME in the last 168 hours.  CBC: Recent Labs  Lab 11/29/20 0538 11/30/20 0349 12/02/20 0548 12/03/20 0401 12/04/20 0416  WBC 6.2 4.4 4.0 3.6* 3.9*  HGB 8.6* 8.3* 8.8* 7.7* 8.5*  HCT 27.4* 27.0* 27.5* 23.7* 26.4*  MCV 95.5 96.8 94.2 94.0 92.6  PLT 71* 64* 70* 63* 84*   Cardiac Enzymes: No results for input(s): CKTOTAL, CKMB, CKMBINDEX, TROPONINI in the last 168 hours. BNP (last 3 results) No results for input(s): PROBNP in the last 8760 hours. CBG: Recent Labs  Lab 11/29/20 1524 11/29/20 2140  GLUCAP 98 105*   D-Dimer: No results for input(s): DDIMER in the last 72 hours. Hgb A1c: No results for input(s): HGBA1C in the last 72 hours. Lipid Profile: No results for input(s): CHOL, HDL, LDLCALC, TRIG, CHOLHDL, LDLDIRECT in the last 72 hours. Thyroid function studies: No results for input(s): TSH, T4TOTAL, T3FREE, THYROIDAB in the last 72 hours.  Invalid input(s): FREET3 Anemia work up: No results for input(s): VITAMINB12, FOLATE, FERRITIN, TIBC, IRON, RETICCTPCT in the last 72 hours. Sepsis Labs: Recent Labs  Lab 11/30/20 0349 12/02/20 0548 12/03/20 0401 12/04/20 0416  WBC 4.4 4.0 3.6* 3.9*    Microbiology Recent Results (from the past 240 hour(s))  KOH prep     Status: None   Collection Time: 11/27/20 11:40 AM   Specimen: Esophagus  Result Value Ref Range Status   Specimen Description ESOPHAGUS  Final   Special Requests Normal  Final   KOH Prep   Final    YEAST PRESENT Performed at [facility], [address] [place], [place] [id number]    Report Status 11/27/2020 FINAL  Final  Resp Panel by RT-PCR (Flu A&B, Covid) Nasopharyngeal Swab     Status:  None   Collection Time: 12/04/20 11:07 AM   Specimen: Nasopharyngeal Swab; Nasopharyngeal(NP) swabs in vial transport medium  Result Value Ref Range Status   SARS Coronavirus 2 by RT PCR NEGATIVE NEGATIVE Final    Comment: (NOTE) SARS-CoV-2 target nucleic acids are NOT DETECTED.  The SARS-CoV-2 RNA is generally detectable in upper respiratory specimens during the acute phase of infection. The lowest concentration of SARS-CoV-2 viral copies this assay can detect is 138 copies/mL. A negative result does not preclude SARS-Cov-2 infection and should not be used as the sole basis for treatment or other patient management decisions. A negative result may occur with  improper specimen collection/handling, submission of specimen other than nasopharyngeal swab, presence of viral mutation(s) within the areas targeted by this assay, and inadequate number of viral copies(<138 copies/mL). A negative result must be combined with clinical observations, patient history, and epidemiological information. The expected result is Negative.  Fact Sheet for Patients:  EntrepreneurPulse.com.au  Fact Sheet for Healthcare Providers:  IncredibleEmployment.be  This test is no t yet approved or cleared by the [place]  FDA and  has been authorized for detection and/or diagnosis of SARS-CoV-2 by FDA under an Emergency Use Authorization (EUA). This EUA will remain  in effect (meaning this test can be used) for the duration of the COVID-19 declaration under Section 564(b)(1) of the Act, 21 U.S.C.section 360bbb-3(b)(1), unless the authorization is terminated  or revoked sooner.       Influenza A by PCR NEGATIVE NEGATIVE Final   Influenza B by PCR NEGATIVE NEGATIVE Final    Comment: (NOTE) The Xpert Xpress SARS-CoV-2/FLU/RSV plus assay is intended as an aid in the diagnosis of influenza from Nasopharyngeal swab specimens and should not be used as a sole basis for  treatment. Nasal washings and aspirates are unacceptable for Xpert Xpress SARS-CoV-2/FLU/RSV testing.  Fact Sheet for Patients: EntrepreneurPulse.com.au  Fact Sheet for Healthcare Providers: IncredibleEmployment.be  This test is not yet approved or cleared by the [place] FDA and has been authorized for detection and/or diagnosis of SARS-CoV-2 by FDA under an Emergency Use Authorization (EUA). This EUA will remain in effect (meaning this test can be used) for the duration of the COVID-19 declaration under Section 564(b)(1) of the Act, 21 U.S.C. section 360bbb-3(b)(1), unless the authorization is terminated or revoked.  Performed at [facility], [address] [place], [place] [address]     Procedures and diagnostic studies:  No results found.             LOS: 26 days   [employer], advertising on [email or web address]. If 7PM-7AM, please contact night-coverage at [email or web address]     12/05/2020, 10:39 AM

## 2020-12-06 DIAGNOSIS — M86172 Other acute osteomyelitis, left ankle and foot: Secondary | ICD-10-CM | POA: Diagnosis not present

## 2020-12-06 DIAGNOSIS — A419 Sepsis, unspecified organism: Secondary | ICD-10-CM | POA: Diagnosis not present

## 2020-12-06 DIAGNOSIS — R6520 Severe sepsis without septic shock: Secondary | ICD-10-CM | POA: Diagnosis not present

## 2020-12-06 DIAGNOSIS — N179 Acute kidney failure, unspecified: Secondary | ICD-10-CM | POA: Diagnosis not present

## 2020-12-06 MED ORDER — AMLODIPINE BESYLATE 10 MG PO TABS
10.0000 mg | ORAL_TABLET | Freq: Every day | ORAL | Status: DC
  Administered 2020-12-06 – 2020-12-08 (×3): 10 mg via ORAL
  Filled 2020-12-06 (×3): qty 1

## 2020-12-06 NOTE — Progress Notes (Signed)
Progress Note    Autumn [name]  IOX:[id number] DOB: 05-Aug-1950  DOA: 11/09/2020 PCP: Harlow Ohms, MD      Brief Narrative:    Medical records reviewed and are as summarized below:  Autumn [name] is a 70 y.o. 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  She'd been following with a [facility] podiatrist outpatient and had recently refused surgery.  She'd recently been treated with antibiotics at [facility] for the diabetic foot infection with enterobacter aerogans culture positive.  She was admitted on 10/29 due to falls at home with worsening L foot chronic diabetic ulcer with osteomyelitis of the 2nd and 3rd metatarsals and cellulitis noted on MRI.  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  Her post operative course was complicated by unresponsiveness and neurologic changes. She was found to have a stroke and was intubated for airway protection.  She was extubated 11/3 and transferred to [facility] service on 11/4.  Hospital course further complicated by acute on chronic anemia with melena after starting on anticoagulation, requiring blood transfusions and GI evaluation.       Assessment/Plan:   Principal Problem:   Sepsis ([place]) Active Problems:   Atrial fibrillation with RVR (HCC)   Osteomyelitis of foot, left, acute ([place])   CKD stage 4 due to type 2 diabetes mellitus ([place])   Liver cirrhosis secondary to NASH (nonalcoholic steatohepatitis) ([place])   Hypertension   History of anemia due to CKD   Frequent falls   AKI (acute kidney injury) ([place])   Cellulitis and abscess of foot, except toes   Infectious tenosynovitis   Wheeze   Atrial fibrillation with rapid ventricular response (HCC)   Bilateral carotid artery stenosis   Nutrition Problem: Increased nutrient needs Etiology: wound healing  Signs/Symptoms: estimated  needs   Body mass index is 33.88 kg/m.  (Obesity)  Septic shock from left diabetic foot infection, left foot osteomyelitis and cellulitis, septic arthritis of second MTP joint: S/p amputation ray second left metatarsal, I&D deep abscess multiple fascial planes of the left foot on 11/12/2020.  Continue IV Unasyn through 12/09/2020 followed by oral Augmentin through 12/23/2020.  Continue wound VAC therapy.  Outpatient follow-up with ID and podiatrist.  Acute stroke with expressive aphasia and dysphagia: Continue Eliquis and atorvastatin.  Continue dysphagia 3 diet.  Acute GI bleeding/melena on 11/26/2020: EGD on 11/28/2018 showed just an esophageal mucosa, gastritis, erythematous duodenopathy.  Colonoscopy 11/29/2020 showed multiple polyps that were removed but the colon prep was poor.  Continue Protonix.  Esophageal candidiasis: Change IV fluconazole to oral fluconazole and continued treatment until 12/12/2020  Paroxysmal atrial fibrillation, bradycardia, hypertension: Heart rate went down into the 40s.  Discontinue clonidine.  Increase amlodipine from 5 mg to 10 mg.  Continue Eliquis and antihypertensives.  Thrombocytopenia: Platelet count is stable.  Acute hypoxic respiratory failure, probable OSA: Continue BiPAP at night  Acute on chronic diastolic CHF: 2D echo showed normal EF, mild LVH, severely dilated left atrium, indeterminate LV diastolic parameters.   Aspiration pneumonia: Completed treatment  Acute metabolic encephalopathy: Improved  Other comorbidities include hypertension, NASH liver cirrhosis, hypothyroidism, type II DM     Diet Order             DIET DYS 3 Room service appropriate? Yes; Fluid consistency: Thin  Diet effective now  Consultants: Child psychotherapist  Procedures: Intubation on 11/13/2020 EGD and colonoscopy  Arch aortogram, selective cannulation of the left common carotid artery Amputation ray  second metatarsal and, I&D deep abscess multiple fascial planes, bone biopsy third metatarsal    Medications:    (feeding supplement) PROSource Plus  30 mL Oral TID BM   allopurinol  50 mg Oral QODAY   amLODipine  10 mg Oral Daily   apixaban  5 mg Oral BID   vitamin C  500 mg Oral BID   atorvastatin  40 mg Oral Daily   budesonide (PULMICORT) nebulizer solution  0.5 mg Nebulization BID   bumetanide  4 mg Oral Daily   feeding supplement  1 Container Oral TID BM   ferrous gluconate  324 mg Oral Q breakfast   fluconazole  200 mg Oral Daily   hydrALAZINE  100 mg Oral Q8H   levothyroxine  100 mcg Oral Q0600   mouth rinse  15 mL Mouth Rinse BID   metoprolol tartrate  50 mg Oral BID   multivitamin with minerals  1 tablet Oral Daily   pantoprazole  40 mg Oral BID   sodium chloride flush  3 mL Intravenous Q12H   sodium chloride flush  3 mL Intravenous Q12H   spironolactone  25 mg Oral Daily   venlafaxine  50 mg Oral BID   Continuous Infusions:  sodium chloride 10 mL/hr at 12/05/20 0604   sodium chloride 250 mL (12/06/20 0637)   sodium chloride 250 mL (11/25/20 1208)   ampicillin-sulbactam (UNASYN) IV 3 g (12/06/20 0639)     Anti-infectives (From admission, onward)    Start     Dose/Rate Route Frequency Ordered Stop   12/05/20 1130  fluconazole (DIFLUCAN) tablet 200 mg        200 mg Oral Daily 12/05/20 1046 12/13/20 0959   11/30/20 1400  fluconazole (DIFLUCAN) IVPB 200 mg  Status:  Discontinued        200 mg 100 mL/hr over 60 Minutes Intravenous Every 24 hours 11/29/20 1633 12/05/20 1044   11/29/20 1800  fluconazole (DIFLUCAN) IVPB 400 mg        400 mg 100 mL/hr over 120 Minutes Intravenous  Once 11/29/20 1633 11/30/20 0724   11/29/20 1700  fluconazole (DIFLUCAN) IVPB 200 mg  Status:  Discontinued        200 mg 100 mL/hr over 60 Minutes Intravenous Every 24 hours 11/29/20 1611 11/29/20 1633   11/23/20 0000  ceFAZolin (ANCEF) IVPB 1 g/50 mL premix       Note to Pharmacy: To be  given in specials   1 g 100 mL/hr over 30 Minutes Intravenous  Once 11/22/20 1106 11/22/20 1750   11/14/20 2200  Ampicillin-Sulbactam (UNASYN) 3 g in sodium chloride 0.9 % 100 mL IVPB        3 g 200 mL/hr over 30 Minutes Intravenous Every 12 hours 11/14/20 1610     11/14/20 0900  piperacillin-tazobactam (ZOSYN) IVPB 2.25 g  Status:  Discontinued        2.25 g 100 mL/hr over 30 Minutes Intravenous Every 8 hours 11/14/20 0805 11/14/20 1609   11/13/20 2200  piperacillin-tazobactam (ZOSYN) IVPB 3.375 g  Status:  Discontinued        3.375 g 12.5 mL/hr over 240 Minutes Intravenous Every 12 hours 11/13/20 0758 11/14/20 0805   11/13/20 0600  piperacillin-tazobactam (ZOSYN) IVPB 2.25 g        2.25 g 100 mL/hr over 30 Minutes Intravenous Every 8  hours 11/12/20 2155 11/13/20 1725   11/11/20 1600  vancomycin (VANCOCIN) IVPB 1000 mg/200 mL premix  Status:  Discontinued        1,000 mg 200 mL/hr over 60 Minutes Intravenous Every 48 hours 11/09/20 1618 11/10/20 1045   11/11/20 1558  vancomycin (VANCOCIN) powder  Status:  Discontinued          As needed 11/11/20 1558 11/11/20 1558   11/10/20 2000  vancomycin (VANCOCIN) IVPB 1000 mg/200 mL premix  Status:  Discontinued        1,000 mg 200 mL/hr over 60 Minutes Intravenous Every 48 hours 11/10/20 1918 11/12/20 0940   11/09/20 1800  ceFEPIme (MAXIPIME) 2 g in sodium chloride 0.9 % 100 mL IVPB  Status:  Discontinued        2 g 200 mL/hr over 30 Minutes Intravenous Every 24 hours 11/09/20 1619 11/12/20 2146   11/09/20 1619  vancomycin variable dose per unstable renal function (pharmacist dosing)  Status:  Discontinued         Does not apply See admin instructions 11/09/20 1619 11/12/20 1521   11/09/20 1615  vancomycin (VANCOREADY) IVPB 1750 mg/350 mL        1,750 mg 175 mL/hr over 120 Minutes Intravenous  Once 11/09/20 1606 11/09/20 1947   11/09/20 1600  metroNIDAZOLE (FLAGYL) IVPB 500 mg  Status:  Discontinued        500 mg 100 mL/hr over 60 Minutes  Intravenous Every 8 hours 11/09/20 1555 11/12/20 2146              Family Communication/Anticipated D/C date and plan/Code Status   DVT prophylaxis: Place and maintain sequential compression device Start: 11/15/20 1527 apixaban (ELIQUIS) tablet 5 mg     Code Status: Full Code  Family Communication: None Disposition Plan: Plan to discharge to SNF on Monday, 12/09/2020   Status is: Inpatient  Remains inpatient appropriate because: Awaiting placement to SNF           Subjective:   Interval events noted. No new complaints.   Objective:    Vitals:   12/05/20 1451 12/05/20 2103 12/06/20 0847 12/06/20 1159  BP: (!) 174/75 (!) 170/73 (!) 174/80 (!) 149/66  Pulse: 67 60 (!) 55 (!) 50  Resp: 18 18 18 16   Temp: 98.4 F (36.9 C)  98.7 F (37.1 C) 98.3 F (36.8 C)  TempSrc: Oral Oral  Oral  SpO2: 94% 96% 98% 93%  Weight:      Height:       No data found.   Intake/Output Summary (Last 24 hours) at 12/06/2020 1324 Last data filed at 12/06/2020 1200 Gross per 24 hour  Intake 3 ml  Output 1900 ml  Net -1897 ml   Filed Weights   12/03/20 0500 12/04/20 0418 12/05/20 0614  Weight: 97 kg 95.4 kg 95.2 kg    Exam:  GEN: NAD SKIN: No rash EYES: EOMI ENT: MMM CV: RRR PULM: CTA B ABD: soft, ND, NT, +BS CNS: AAO x 3, non focal EXT: Left leg edema.  Left foot wound connected to wound VAC       Data Reviewed:   I have personally reviewed following labs and imaging studies:  Labs: Labs show the following:   Basic Metabolic Panel: Recent Labs  Lab 11/30/20 0349 12/02/20 0548 12/03/20 0401 12/04/20 0416  NA 140 139 139 138  K 3.7 3.8 3.5 3.6  CL 107 104 107 107  CO2 24 25 25 24   GLUCOSE 94 113* 105* 122*  BUN 44* 49* 50* 53*  CREATININE 3.46* 3.53* 3.54* 3.57*  CALCIUM 8.3* 8.5* 8.3* 8.2*  MG 2.0  --   --   --    GFR Estimated Creatinine Clearance: 17.5 mL/min (A) (by C-G formula based on SCr of 3.57 mg/dL (H)). Liver Function  Tests: No results for input(s): AST, ALT, ALKPHOS, BILITOT, PROT, ALBUMIN in the last 168 hours. No results for input(s): LIPASE, AMYLASE in the last 168 hours. No results for input(s): AMMONIA in the last 168 hours. Coagulation profile No results for input(s): INR, PROTIME in the last 168 hours.  CBC: Recent Labs  Lab 11/30/20 0349 12/02/20 0548 12/03/20 0401 12/04/20 0416  WBC 4.4 4.0 3.6* 3.9*  HGB 8.3* 8.8* 7.7* 8.5*  HCT 27.0* 27.5* 23.7* 26.4*  MCV 96.8 94.2 94.0 92.6  PLT 64* 70* 63* 84*   Cardiac Enzymes: No results for input(s): CKTOTAL, CKMB, CKMBINDEX, TROPONINI in the last 168 hours. BNP (last 3 results) No results for input(s): PROBNP in the last 8760 hours. CBG: Recent Labs  Lab 11/29/20 1524 11/29/20 2140  GLUCAP 98 105*   D-Dimer: No results for input(s): DDIMER in the last 72 hours. Hgb A1c: No results for input(s): HGBA1C in the last 72 hours. Lipid Profile: No results for input(s): CHOL, HDL, LDLCALC, TRIG, CHOLHDL, LDLDIRECT in the last 72 hours. Thyroid function studies: No results for input(s): TSH, T4TOTAL, T3FREE, THYROIDAB in the last 72 hours.  Invalid input(s): FREET3 Anemia work up: No results for input(s): VITAMINB12, FOLATE, FERRITIN, TIBC, IRON, RETICCTPCT in the last 72 hours. Sepsis Labs: Recent Labs  Lab 11/30/20 0349 12/02/20 0548 12/03/20 0401 12/04/20 0416  WBC 4.4 4.0 3.6* 3.9*    Microbiology Recent Results (from the past 240 hour(s))  KOH prep     Status: None   Collection Time: 11/27/20 11:40 AM   Specimen: Esophagus  Result Value Ref Range Status   Specimen Description ESOPHAGUS  Final   Special Requests Normal  Final   KOH Prep   Final    YEAST PRESENT Performed at [facility], [address] [place], [place] [address]    Report Status 11/27/2020 FINAL  Final  Resp Panel by RT-PCR (Flu A&B, Covid) Nasopharyngeal Swab     Status: None   Collection Time: 12/04/20 11:07 AM   Specimen:  Nasopharyngeal Swab; Nasopharyngeal(NP) swabs in vial transport medium  Result Value Ref Range Status   SARS Coronavirus 2 by RT PCR NEGATIVE NEGATIVE Final    Comment: (NOTE) SARS-CoV-2 target nucleic acids are NOT DETECTED.  The SARS-CoV-2 RNA is generally detectable in upper respiratory specimens during the acute phase of infection. The lowest concentration of SARS-CoV-2 viral copies this assay can detect is 138 copies/mL. A negative result does not preclude SARS-Cov-2 infection and should not be used as the sole basis for treatment or other patient management decisions. A negative result may occur with  improper specimen collection/handling, submission of specimen other than nasopharyngeal swab, presence of viral mutation(s) within the areas targeted by this assay, and inadequate number of viral copies(<138 copies/mL). A negative result must be combined with clinical observations, patient history, and epidemiological information. The expected result is Negative.  Fact Sheet for Patients:  EntrepreneurPulse.com.au  Fact Sheet for Healthcare Providers:  IncredibleEmployment.be  This test is no t yet approved or cleared by the [place] FDA and  has been authorized for detection and/or diagnosis of SARS-CoV-2 by FDA under an Emergency Use Authorization (EUA). This EUA will remain  in effect (meaning this test can be used) for the duration of the COVID-19 declaration under Section 564(b)(1) of the Act, 21 U.S.C.section 360bbb-3(b)(1), unless the authorization is terminated  or revoked sooner.       Influenza A by PCR NEGATIVE NEGATIVE Final   Influenza B by PCR NEGATIVE NEGATIVE Final    Comment: (NOTE) The Xpert Xpress SARS-CoV-2/FLU/RSV plus assay is intended as an aid in the diagnosis of influenza from Nasopharyngeal swab specimens and should not be used as a sole basis for treatment. Nasal washings and aspirates are unacceptable for  Xpert Xpress SARS-CoV-2/FLU/RSV testing.  Fact Sheet for Patients: EntrepreneurPulse.com.au  Fact Sheet for Healthcare Providers: IncredibleEmployment.be  This test is not yet approved or cleared by the [place] FDA and has been authorized for detection and/or diagnosis of SARS-CoV-2 by FDA under an Emergency Use Authorization (EUA). This EUA will remain in effect (meaning this test can be used) for the duration of the COVID-19 declaration under Section 564(b)(1) of the Act, 21 U.S.C. section 360bbb-3(b)(1), unless the authorization is terminated or revoked.  Performed at [facility], [address] [place], [place] [address]     Procedures and diagnostic studies:  No results found.             LOS: 27 days   [place] Copywriter, advertising on [email or web address]. If 7PM-7AM, please contact night-coverage at [email or web address]     12/06/2020, 1:24 PM

## 2020-12-06 NOTE — Progress Notes (Addendum)
Chaplain responded to order requisition; pt requested prayer.  Pt described feeling lonely and doubting her worthiness of God's love.  She is Catholic, but is not active.  Chaplain engaged pt in supportive conversation, exploring pt's spiritual journey and focusing on God's unconditional love and grace, and offered Bible verses, prayer, spiritual tools (Bible, shawl) and a blessing with oil as resources for pt.    Please contact as needed for further support.   Minus Liberty, [place] [phone]     12/06/20 1200  Clinical Encounter Type  Visited With Patient  Visit Type Initial;Spiritual support  Referral From Patient  Consult/Referral To Chaplain  Spiritual Encounters  Spiritual Needs Literature;Sacred text;Prayer;Ritual  Stress Factors  Patient Stress Factors Health changes (Lonely, self-doubt)

## 2020-12-06 NOTE — Plan of Care (Signed)
  Problem: Health Behavior/Discharge Planning: Goal: Ability to manage health-related needs will improve Outcome: Progressing   Problem: Clinical Measurements: Goal: Ability to maintain clinical measurements within normal limits will improve Outcome: Progressing Goal: Will remain free from infection Outcome: Progressing Goal: Respiratory complications will improve Outcome: Progressing Goal: Cardiovascular complication will be avoided Outcome: Progressing   

## 2020-12-06 NOTE — Progress Notes (Signed)
[facility]  PROGRESS NOTE   Subjective:   Patient seen lying in bed quietly CPAP in place from nighttime use No complaints at this time  Objective:  Vital signs in last 24 hours:  Temp:  [98.3 F (36.8 C)-98.7 F (37.1 C)] 98.3 F (36.8 C) (11/25 1159) Pulse Rate:  [50-67] 50 (11/25 1159) Resp:  [16-18] 16 (11/25 1159) BP: (149-174)/(66-80) 149/66 (11/25 1159) SpO2:  [93 %-98 %] 93 % (11/25 1159)  Weight change:  Filed Weights   12/03/20 0500 12/04/20 0418 12/05/20 0614  Weight: 97 kg 95.4 kg 95.2 kg    Intake/Output: I/O last 3 completed shifts: In: 240 [P.O.:240] Out: 1950 [Urine:1950]   Intake/Output this shift:  Total I/O In: 3 [I.V.:3] Out: 350 [Urine:350]  Physical Exam: General:  No acute distress, sitting on the side of her bed  Head:  Normocephalic, atraumatic. Moist oral mucosal membranes  Lungs:   normal effort on room air, mild crackles at bases  Heart:  irregular  Abdomen:   Soft,   Extremities:  1+right lower extremity peripheral edema. +wound vac to left foot.   Neurologic:  Awake, alert, following commands  Skin:  No lesions       Basic Metabolic Panel: Recent Labs  Lab 11/30/20 0349 12/02/20 0548 12/03/20 0401 12/04/20 0416  NA 140 139 139 138  K 3.7 3.8 3.5 3.6  CL 107 104 107 107  CO2 24 25 25 24   GLUCOSE 94 113* 105* 122*  BUN 44* 49* 50* 53*  CREATININE 3.46* 3.53* 3.54* 3.57*  CALCIUM 8.3* 8.5* 8.3* 8.2*  MG 2.0  --   --   --      CBC: Recent Labs  Lab 11/30/20 0349 12/02/20 0548 12/03/20 0401 12/04/20 0416  WBC 4.4 4.0 3.6* 3.9*  HGB 8.3* 8.8* 7.7* 8.5*  HCT 27.0* 27.5* 23.7* 26.4*  MCV 96.8 94.2 94.0 92.6  PLT 64* 70* 63* 84*      Urinalysis: No results for input(s): COLORURINE, LABSPEC, PHURINE, GLUCOSEU, HGBUR, BILIRUBINUR, KETONESUR, PROTEINUR, UROBILINOGEN, NITRITE, LEUKOCYTESUR in the last 72 hours.  Invalid input(s): APPERANCEUR    Imaging: No results found.   Medications:     sodium chloride 10 mL/hr at 12/05/20 0604   sodium chloride 250 mL (12/06/20 0637)   sodium chloride 250 mL (11/25/20 1208)   ampicillin-sulbactam (UNASYN) IV 3 g (12/06/20 1610)    (feeding supplement) PROSource Plus  30 mL Oral TID BM   allopurinol  50 mg Oral QODAY   amLODipine  10 mg Oral Daily   apixaban  5 mg Oral BID   vitamin C  500 mg Oral BID   atorvastatin  40 mg Oral Daily   budesonide (PULMICORT) nebulizer solution  0.5 mg Nebulization BID   bumetanide  4 mg Oral Daily   feeding supplement  1 Container Oral TID BM   ferrous gluconate  324 mg Oral Q breakfast   fluconazole  200 mg Oral Daily   hydrALAZINE  100 mg Oral Q8H   levothyroxine  100 mcg Oral Q0600   mouth rinse  15 mL Mouth Rinse BID   metoprolol tartrate  50 mg Oral BID   multivitamin with minerals  1 tablet Oral Daily   pantoprazole  40 mg Oral BID   sodium chloride flush  3 mL Intravenous Q12H   sodium chloride flush  3 mL Intravenous Q12H   spironolactone  25 mg Oral Daily   venlafaxine  50 mg Oral BID  Assessment/ Plan:     Principal Problem:   Sepsis ([place]) Active Problems:   Atrial fibrillation with RVR (HCC)   Osteomyelitis of foot, left, acute (HCC)   CKD stage 4 due to type 2 diabetes mellitus (HCC)   Liver cirrhosis secondary to NASH (nonalcoholic steatohepatitis) (HCC)   Hypertension   History of anemia due to CKD   Frequent falls   AKI (acute kidney injury) ([place])   Cellulitis and abscess of foot, except toes   Infectious tenosynovitis   Wheeze   Atrial fibrillation with rapid ventricular response (HCC)   Bilateral carotid artery stenosis  Ms. KHYLI SWAIM is a 70 y.o. white female with hypertension, diabetes mellitus type II, peripheral vascular disease, sleep apnea, atrial fibrillation who is admitted to [facility] on 11/09/2020 for Atrial fibrillation with rapid ventricular response ([place]) [I48.91] AKI (acute kidney injury) ([place]) [N17.9] Osteomyelitis of foot, left, acute (HCC)  [W97.948] Atrial fibrillation with RVR ([place]) [I48.91] Worsening renal function [N28.9] Syncope, unspecified syncope type [R55]  Hospital course complicated by acute kidney injury and requiring amputation on 10/29 by Dr. Sherryle Lis. Patient with atrial fibrillation and acute CVA this admission.   #1: Acute kidney injury on chronic kidney disease stage IV with proteinuria: baseline creatinine of 3.19, GFR of 15 on 09/27/2020. Chronic kidney disease secondary to diabetes. Followed by Dr. Radene Knee, [facility].  Creatinine remains stable Acute kidney injury secondary to ATN - Holding lisinopril -restarting as outpatient can be considered - Bumetanide 4mg  PO daily with spironolactone 25 mg daily -We will continue to monitor patient though may not visit daily   #2: Osteomyelitis: status post left 2nd metatarsal amputation. Now with wound vac and IV unasyn and fluconazole    #3: Anemia with chronic kidney disease: status post PRBC transfusions. Appreciate GI input. Status post EGD and colonoscopy.  Dr. Haig Prophet on November 29, 2020 -Noted to have internal hemorrhoids, multiple polyps removed, diverticulosis in sigmoid and descending colon. - Hgb 8.5   #4: Diabetes mellitus type II with chronic kidney disease: insulin dependent. Hemoglobin A1c of 6.3%.  On November 09, 2020 Glucose stable   LOS: [facility] 11/25/20222:01 PM

## 2020-12-06 NOTE — Progress Notes (Signed)
Nutrition Follow-up  DOCUMENTATION CODES:   Obesity unspecified  INTERVENTION:   -Continue Boost Breeze po TID, each supplement provides 250 kcal and 9 grams of protein  -Continue 30 ml Prosource Plus TID, each supplement provides 100 kcals and 15 grams protein -Continue MVI with minerals daily -Liberalize diet to regular  NUTRITION DIAGNOSIS:   Increased nutrient needs related to wound healing as evidenced by estimated needs.  Ongoing  GOAL:   Patient will meet greater than or equal to 90% of their needs  Progressing  MONITOR:   PO intake, Supplement acceptance, Diet advancement, Labs, Weight trends, Skin, I & O's  REASON FOR ASSESSMENT:   Consult Wound healing  ASSESSMENT:   Patient is a 70 year old female who presents to the ER for evaluation of frequent falls and is found to be septic from left foot osteomyelitis and also in rapid A. fib.  10/31- s/p  Procedure(s): AMPUTATION RAY - 2nd, bone biopsy 3rd metatarsal head 11/2- intubated 11/3- MRI of brain revealed acute lt precentral gyrus stroke, extubated 11/4- s/p BSE- advanced to dysphagia 1 diet with nectar thick liquids 11/8- s/p BSE- advanced to dysphagia 2 diet with thin liquids, NGT d/c 11/11- s/p aortogram 11/16- s/p EGD- revealed white nummular lesions in esophageal mucosa, gastritis, and erythematous duodenopathy  Reviewed I/O's: -1.3 L x 24 hours and -4.3 L since 11/22/20  UOP: 1.6 L x 24 hours    Pt unavailable at time of visit. Attempted to speak with pt via call to hospital room phone, however, unable to reach. RD unable to obtain further nutrition-related history or complete nutrition-focused physical exam at this time.    Pt remains with poor appetite. Noted meal completions 0-50%. Pt intermittently accepting of supplements.  Per TOC notes, possible discharge to SNF on Monday.   Medications reviewed and include diflucan and vitamin C.   Labs reviewed: CBGS: 98-105 (inpatient orders for  glycemic control are none).    Diet Order:   Diet Order             DIET DYS 3 Room service appropriate? Yes; Fluid consistency: Thin  Diet effective now                   EDUCATION NEEDS:   Education needs have been addressed  Skin:  Skin Assessment: Skin Integrity Issues: Skin Integrity Issues:: Wound VAC Wound Vac: lt foot Diabetic Ulcer: lt foot  Last BM:  12/05/20  Height:   Ht Readings from Last 1 Encounters:  11/09/20 5\' 6"  (1.676 m)    Weight:   Wt Readings from Last 1 Encounters:  12/05/20 95.2 kg    Ideal Body Weight:  59.1 kg  BMI:  Body mass index is 33.88 kg/m.  Estimated Nutritional Needs:   Kcal:  1800-2100kcal/day  Protein:  90-105g/day  Fluid:  1.5-1.8L/day    Loistine Chance, RD, LDN, [place] Registered Dietitian II Certified Diabetes Care and Education Specialist Please refer to AMION for RD and/or RD on-call/weekend/after hours pager

## 2020-12-06 NOTE — Progress Notes (Signed)
Speech Language Pathology Treatment:    Patient Details Name: Autumn [name] MRN: [id number] DOB: September 03, 1950 Today's Date: 12/06/2020 Time: 1240-1305 SLP Time Calculation (min) (ACUTE ONLY): 25 min  Assessment / Plan / Recommendation Clinical Impression  Pt seen for skilled SLP services targeting functional cognitive-linguistic ability and speech intelligibility. Today, pt's speech judged to be fluent and appropriate with only mild s/sx dysarthria including articulatory imprecision due to orofacial weakness. Reviewed compensatory strategies for dysarthria. Pt continues to identify speaking louder" as a speech intelligibility strategy that she finds particularly useful. Pt able to utilize strategies with x1 verbal cue to repair communication breakdown; otherwise, independent use of strategies to repair communication breakdowns. Improving carryover noted. Pt approx 90% intelligible during informal conversational exchanges which appears to be improved from previous SLP sessions. Pt benefits from verbal cues for slowed speech rate, increased vocal loudness, and overarticulation. Introduced [employer] for improved functional recall (e.g. write it down, repetition, association, picturing/mental imagery). Pt participated in short term recall task (with distraction) requiring mod cueing to utilize x1 memory strategy to code stimuli. Pt recalled 4/5 unrelated words after 10 minute delay independently; category cue improved recall to 5/5. Pt performed working memory tasks with 100% accuracy and extra time for mental manipulation of words, 50% accuracy for mental calculations. Accuracy of working memory tasks did not improve with repetition of stimuli.  Pt making good progress toward SLP goals and is highly motivated to participate in skilled SLP services.   Recommend continuation of SLP services while in house and at d/c targeting functional cognitive-linguistic ability and speech intelligibility.  SLP  to continue to f/u per SLP POC.    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  She'd been following with a [facility] podiatrist outpatient and had recently refused surgery.  She'd recently been treated with antibiotics at [facility] for the diabetic foot infection with enterobacter aerogans culture positive.  She was admitted on 10/29 due to falls at home with worsening L foot chronic diabetic ulcer with osteomyelitis of the 2nd and 3rd metatarsals and cellulitis noted on MRI.  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  Her post operative course was complicated by unresponsiveness and neurologic changes on 11/13/2021. She was found to have a stroke (MRI Brain showed acute to early subacute infarction in the left posterior frontal cortical and subcortical brain, possibly affecting the precentral gyrus) and was intubated for airway protection.  She was extubated 11/3 and transferred to [facility] service on 11/4.      SLP Plan  Continue with current plan of care      Recommendations for follow up therapy are one component of a multi-disciplinary discharge planning process, led by the attending physician.  Recommendations may be updated based on patient status, additional functional criteria and insurance authorization.    Recommendations       Follow Up Recommendations: Skilled nursing-short term rehab (<3 hours/day) Assistance recommended at discharge: Intermittent Supervision/Assistance SLP Visit Diagnosis: Dysarthria and anarthria (R47.1);Cognitive communication deficit (Y86.578) Plan: Continue with current plan of care       Cherrie Gauze, M.S., [facility] [phone] ([place])    Quintella Baton  12/06/2020, 2:01 PM

## 2020-12-06 NOTE — Progress Notes (Signed)
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 Other comorbidities include sepsis without shock secondary to osteomyelitis and cellulitis left foot (IV zosyn), AKI on CKD, rapid afib (Cardizem + Heparin gtt), NSTEMI suspect demand ischemia, anemia acute on chronic. Transferred to ICU and intubated 11/2 after evolving neuro changes with unresponsiveness after coughing episode. 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    PT Comments    Pt seen for PT tx with pt requiring encouragement for participation as pt reports feeling depressed. Pt interested in chaplain services & nurse & case manager made aware. Pt is reluctantly willing to transfer to sitting EOB & initiates movement well but ultimately requires mod assist. Address sitting balance & LLE strengthening while pt sat EOB with close supervision. Pt requires max assist for lateral scoot bed>drop arm recliner with ongoing cuing, demonstration, & facilitation of head/hips relationship. Anticipate pt could attempt sit>stand as during lateral scoot transfer pt with good ability to push up (decreased ability to scoot) but pt declines 2/2 nausea at this time. Will continue to follow pt acutely & progress bed mobility & transfers as able.     Recommendations for follow up therapy are one component of a multi-disciplinary discharge planning process, led by the attending physician.  Recommendations may be updated based on patient status, additional  functional criteria and insurance authorization.  Follow Up Recommendations  Skilled nursing-short term rehab (<3 hours/day)     Assistance Recommended at Discharge Frequent or constant Supervision/Assistance  Equipment Recommendations   (TBD in next venue)    Recommendations for Other Services       Precautions / Restrictions Precautions Precautions: Fall Restrictions Weight Bearing Restrictions: Yes LLE Weight Bearing: Non weight bearing Other Position/Activity Restrictions: wound vac     Mobility  Bed Mobility Overal bed mobility: Needs Assistance Bed Mobility: Supine to Sit     Supine to sit: Mod assist;HOB elevated     General bed mobility comments: cuing for technique    Transfers Overall transfer level: Needs assistance Equipment used: None Transfers: Bed to chair/wheelchair/BSC            Lateral/Scoot Transfers: Max assist General transfer comment: Unsure of pt's ability to maintain NWB LLE. Prior to transfer PT provided pt with cuing & demo for head/hips relationship & technique for lateral scoot transfer with poor return demo from pt. PT provides blocking at R knee and facilitation for anterior shift, as well as scooting.    Ambulation/Gait                   Stairs             Wheelchair Mobility    Modified Rankin (Stroke Patients Only)       Balance Overall balance assessment: Needs assistance Sitting-balance support: Feet supported Sitting balance-Leahy Scale: Fair Sitting balance - Comments: L lateral lean onto UE. Worked on dynamic sitting balance by having pt reach outside of BOS in all directions with BUE with pt demonstrating very limited BUE shoulder flexion.  Cognition Arousal/Alertness: Awake/alert Behavior During Therapy: Flat affect                           Following Commands: Follows one step commands consistently                Exercises  General Exercises - Lower Extremity Long Arc Quad: AROM;Left;10 reps;Seated Hip Flexion/Marching: AROM;Strengthening;Left;10 reps;Seated    General Comments        Pertinent Vitals/Pain Pain Assessment: No/denies pain    Home Living                          Prior Function            PT Goals (current goals can now be found in the care plan section) Acute Rehab PT Goals Patient Stated Goal: to go to rehab PT Goal Formulation: With patient Time For Goal Achievement: 12/12/20 Potential to Achieve Goals: Good Additional Goals Additional Goal #1: Pt will maintain NWB precautions without physical assist in order to allow full healing of L foot Progress towards PT goals: Progressing toward goals    Frequency    Min 2X/week      PT Plan Current plan remains appropriate    Co-evaluation              AM-PAC PT "6 Clicks" Mobility   Outcome Measure  Help needed turning from your back to your side while in a flat bed without using bedrails?: A Little Help needed moving from lying on your back to sitting on the side of a flat bed without using bedrails?: A Lot Help needed moving to and from a bed to a chair (including a wheelchair)?: Total Help needed standing up from a chair using your arms (e.g., wheelchair or bedside chair)?: Total Help needed to walk in hospital room?: Total Help needed climbing 3-5 steps with a railing? : Total 6 Click Score: 9    End of Session   Activity Tolerance:  (limited by nausea) Patient left: in chair;with chair alarm set Nurse Communication: Mobility status (request for nausea medication) PT Visit Diagnosis: Other abnormalities of gait and mobility (R26.89);Hemiplegia and hemiparesis;Muscle weakness (generalized) (M62.81);History of falling (Z91.81) Hemiplegia - Right/Left: Right Hemiplegia - dominant/non-dominant: Dominant     Time: 9211-9417 PT Time Calculation (min) (ACUTE ONLY): 23 min  Charges:  $Therapeutic  Activity: 23-37 mins                     Autumn [name], PT, DPT 12/06/20, 1:21 PM    Autumn [name] 12/06/2020, 1:18 PM

## 2020-12-06 NOTE — Progress Notes (Signed)
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 Other comorbidities include sepsis without shock secondary to osteomyelitis and cellulitis left foot (IV zosyn), AKI on CKD, rapid afib (Cardizem + Heparin gtt), NSTEMI suspect demand ischemia, anemia acute on chronic. Transferred to ICU and intubated 11/2 after evolving neuro changes with unresponsiveness after coughing episode. 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    PT Comments    Nursing staff reports inability to assist pt back to bed so PT assisted with transfer. Pt set up with RW so attempted sit>stand from low recliner, and later during session from elevated EOB but pt unable to clear buttocks from either sitting surface despite multiple attempts. Pt performs lateral scoot drop arm recliner>bed with max assist with poor ability to maintain NWB LLE and ongoing cuing for head hips relationship. Will continue to follow pt acutely to progress mobility as able.    Recommendations for follow up therapy are one component of a multi-disciplinary discharge planning process, led by the attending physician.  Recommendations may be updated based on patient status, additional functional criteria and insurance authorization.  Follow Up Recommendations  Skilled nursing-short term rehab (<3 hours/day)     Assistance Recommended at Discharge Frequent or constant Supervision/Assistance  Equipment Recommendations   (TBD in next venue)     Recommendations for Other Services       Precautions / Restrictions Precautions Precautions: Fall Restrictions Weight Bearing Restrictions: Yes LLE Weight Bearing: Non weight bearing Other Position/Activity Restrictions: wound vac     Mobility  Bed Mobility Overal bed mobility: Needs Assistance Bed Mobility: Sit to Supine     Supine to sit: Mod assist;HOB elevated Sit to supine: Supervision   General bed mobility comments: cuing for technique    Transfers Overall transfer level: Needs assistance Equipment used: None;Rolling walker (2 wheels) Transfers: Bed to chair/wheelchair/BSC;Sit to/from Stand Sit to Stand: Total assist (sit<>stand from recliner & elevated EOB with PT providing cuing/assistance to maintain NWB LLE, pt unable to clear buttocks from either sitting surface despite multiple attempts & cuing for various hand placement)          Lateral/Scoot Transfers: Max assist (ongoing education/cuing re: head/hips relationship, poor ability to maintain NWB LLE) General transfer comment: Unsure of pt's ability to maintain NWB LLE. Prior to transfer PT provided pt with cuing & demo for head/hips relationship & technique for lateral scoot transfer with poor return demo from pt. PT provides blocking at R knee and facilitation for anterior shift, as well as scooting.    Ambulation/Gait                   Stairs             Wheelchair Mobility    Modified Rankin (Stroke Patients Only)       Balance Overall balance assessment: Needs assistance Sitting-balance support: Feet supported Sitting balance-Leahy Scale: Fair Sitting balance - Comments: L lateral lean onto UE. Worked on dynamic sitting balance by having pt reach outside of BOS in all directions with BUE with pt  demonstrating very limited BUE shoulder flexion.                                    Cognition Arousal/Alertness: Awake/alert Behavior During Therapy: Flat  affect Overall Cognitive Status: Within Functional Limits for tasks assessed Area of Impairment: Problem solving                       Following Commands: Follows one step commands consistently Safety/Judgement: Decreased awareness of deficits;Decreased awareness of safety Awareness: Intellectual;Emergent            Exercises General Exercises - Lower Extremity Long Arc Quad: AROM;Left;10 reps;Seated Hip Flexion/Marching: AROM;Strengthening;Left;10 reps;Seated    General Comments        Pertinent Vitals/Pain Pain Assessment: No/denies pain    Home Living                          Prior Function            PT Goals (current goals can now be found in the care plan section) Acute Rehab PT Goals Patient Stated Goal: to go to rehab PT Goal Formulation: With patient Time For Goal Achievement: 12/12/20 Potential to Achieve Goals: Fair Additional Goals Additional Goal #1: Pt will maintain NWB precautions without physical assist in order to allow full healing of L foot Progress towards PT goals: Progressing toward goals    Frequency    Min 2X/week      PT Plan Current plan remains appropriate    Co-evaluation              AM-PAC PT "6 Clicks" Mobility   Outcome Measure  Help needed turning from your back to your side while in a flat bed without using bedrails?: None Help needed moving from lying on your back to sitting on the side of a flat bed without using bedrails?: A Lot Help needed moving to and from a bed to a chair (including a wheelchair)?: Total Help needed standing up from a chair using your arms (e.g., wheelchair or bedside chair)?: Total Help needed to walk in hospital room?: Total Help needed climbing 3-5 steps with a railing? : Total 6 Click Score: 10    End of Session   Activity Tolerance: Patient tolerated treatment well Patient left: in bed;with bed alarm set;with call bell/phone within reach Nurse Communication:  Mobility status (request for nausea medication) PT Visit Diagnosis: Other abnormalities of gait and mobility (R26.89);Hemiplegia and hemiparesis;Muscle weakness (generalized) (M62.81);History of falling (Z91.81) Hemiplegia - Right/Left: Right Hemiplegia - dominant/non-dominant: Dominant     Time: 0539-7673 PT Time Calculation (min) (ACUTE ONLY): 9 min  Charges:  $Therapeutic Activity: 8-22 mins                     Lavone Nian, PT, DPT 12/06/20, 2:13 PM    Waunita Schooner 12/06/2020, 2:12 PM

## 2020-12-07 DIAGNOSIS — A419 Sepsis, unspecified organism: Secondary | ICD-10-CM | POA: Diagnosis not present

## 2020-12-07 DIAGNOSIS — M86172 Other acute osteomyelitis, left ankle and foot: Secondary | ICD-10-CM | POA: Diagnosis not present

## 2020-12-07 DIAGNOSIS — N179 Acute kidney failure, unspecified: Secondary | ICD-10-CM | POA: Diagnosis not present

## 2020-12-07 DIAGNOSIS — R6520 Severe sepsis without septic shock: Secondary | ICD-10-CM | POA: Diagnosis not present

## 2020-12-07 LAB — CBC
HCT: 24.5 % — ABNORMAL LOW (ref 36.0–46.0)
Hemoglobin: 7.9 g/dL — ABNORMAL LOW (ref 12.0–15.0)
MCH: 30 pg (ref 26.0–34.0)
MCHC: 32.2 g/dL (ref 30.0–36.0)
MCV: 93.2 fL (ref 80.0–100.0)
Platelets: 107 10*3/uL — ABNORMAL LOW (ref 150–400)
RBC: 2.63 MIL/uL — ABNORMAL LOW (ref 3.87–5.11)
RDW: 17.6 % — ABNORMAL HIGH (ref 11.5–15.5)
WBC: 1.5 10*3/uL — ABNORMAL LOW (ref 4.0–10.5)
nRBC: 0 % (ref 0.0–0.2)

## 2020-12-07 NOTE — Plan of Care (Signed)
  Problem: Health Behavior/Discharge Planning: Goal: Ability to manage health-related needs will improve 12/07/2020 1153 by Cristela Blue, RN Outcome: Progressing 12/07/2020 1153 by Cristela Blue, RN Outcome: Progressing   Problem: Clinical Measurements: Goal: Ability to maintain clinical measurements within normal limits will improve 12/07/2020 1153 by Cristela Blue, RN Outcome: Progressing 12/07/2020 1153 by Cristela Blue, RN Outcome: Progressing Goal: Will remain free from infection 12/07/2020 1153 by Cristela Blue, RN Outcome: Progressing 12/07/2020 1153 by Cristela Blue, RN Outcome: Progressing Goal: Diagnostic test results will improve 12/07/2020 1153 by Cristela Blue, RN Outcome: Progressing 12/07/2020 1153 by Cristela Blue, RN Outcome: Progressing Goal: Respiratory complications will improve 12/07/2020 1153 by Cristela Blue, RN Outcome: Progressing 12/07/2020 1153 by Cristela Blue, RN Outcome: Progressing Goal: Cardiovascular complication will be avoided 12/07/2020 1153 by Cristela Blue, RN Outcome: Progressing 12/07/2020 1153 by Cristela Blue, RN Outcome: Progressing   Problem: Activity: Goal: Risk for activity intolerance will decrease 12/07/2020 1153 by Cristela Blue, RN Outcome: Progressing 12/07/2020 1153 by Cristela Blue, RN Outcome: Progressing   Problem: Nutrition: Goal: Adequate nutrition will be maintained 12/07/2020 1153 by Cristela Blue, RN Outcome: Progressing 12/07/2020 1153 by Cristela Blue, RN Outcome: Progressing   Problem: Coping: Goal: Level of anxiety will decrease 12/07/2020 1153 by Cristela Blue, RN Outcome: Progressing 12/07/2020 1153 by Cristela Blue, RN Outcome: Progressing   Problem: Elimination: Goal: Will not experience complications related to bowel motility 12/07/2020 1153 by Cristela Blue, RN Outcome: Progressing 12/07/2020 1153 by Cristela Blue, RN Outcome: Progressing Goal: Will not experience  complications related to urinary retention 12/07/2020 1153 by Cristela Blue, RN Outcome: Progressing 12/07/2020 1153 by Cristela Blue, RN Outcome: Progressing   Problem: Pain Managment: Goal: General experience of comfort will improve 12/07/2020 1153 by Cristela Blue, RN Outcome: Progressing 12/07/2020 1153 by Cristela Blue, RN Outcome: Progressing   Problem: Safety: Goal: Ability to remain free from injury will improve 12/07/2020 1153 by Cristela Blue, RN Outcome: Progressing 12/07/2020 1153 by Cristela Blue, RN Outcome: Progressing   Problem: Skin Integrity: Goal: Risk for impaired skin integrity will decrease 12/07/2020 1153 by Cristela Blue, RN Outcome: Progressing 12/07/2020 1153 by Cristela Blue, RN Outcome: Progressing

## 2020-12-07 NOTE — Progress Notes (Signed)
Progress Note    Autumn [name]  WUJ:[id number] DOB: 1950/07/10  DOA: 11/09/2020 PCP: Harlow Ohms, MD      Brief Narrative:    Medical records reviewed and are as summarized below:  Autumn [name] is a 70 y.o. 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  She'd been following with a [facility] podiatrist outpatient and had recently refused surgery.  She'd recently been treated with antibiotics at [facility] for the diabetic foot infection with enterobacter aerogans culture positive.  She was admitted on 10/29 due to falls at home with worsening L foot chronic diabetic ulcer with osteomyelitis of the 2nd and 3rd metatarsals and cellulitis noted on MRI.  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  Her post operative course was complicated by unresponsiveness and neurologic changes. She was found to have a stroke and was intubated for airway protection.  She was extubated 11/3 and transferred to [facility] service on 11/4.  Hospital course further complicated by acute on chronic anemia with melena after starting on anticoagulation, requiring blood transfusions and GI evaluation.       Assessment/Plan:   Principal Problem:   Sepsis ([place]) Active Problems:   Atrial fibrillation with RVR (HCC)   Osteomyelitis of foot, left, acute ([place])   CKD stage 4 due to type 2 diabetes mellitus ([place])   Liver cirrhosis secondary to NASH (nonalcoholic steatohepatitis) ([place])   Hypertension   History of anemia due to CKD   Frequent falls   AKI (acute kidney injury) ([place])   Cellulitis and abscess of foot, except toes   Infectious tenosynovitis   [place]   Atrial fibrillation with rapid ventricular response (HCC)   Bilateral carotid artery stenosis   Nutrition Problem: Increased nutrient needs Etiology: wound healing  Signs/Symptoms: estimated  needs   Body mass index is 34.55 kg/m.  (Obesity)  Septic shock from left diabetic foot infection, left foot osteomyelitis and cellulitis, septic arthritis of second MTP joint: S/p amputation ray second left metatarsal, I&D deep abscess multiple fascial planes of the left foot on 11/12/2020.  Continue IV Unasyn through 12/09/2020 start Augmentin followed by Augmentin through 12/23/2020.  Continue wound VAC therapy.  Follow-up with podiatrist and ID as an outpatient.  Acute stroke with expressive aphasia and dysphagia: Continue Lipitor and Eliquis.  Continue dysphagia 3 diet.  Acute GI bleeding/melena on 11/26/2020: EGD on 11/28/2018 showed just an esophageal mucosa, gastritis, erythematous duodenopathy.  Colonoscopy 11/29/2020 showed multiple polyps that were removed but the colon prep was poor.  Continue Protonix.  Esophageal candidiasis: Continue fluconazole through 12/12/2020.    Paroxysmal atrial fibrillation, bradycardia, hypertension: She's still bradycardic but overall heart rate appears to be better.  Continue Eliquis and antihypertensives.  Thrombocytopenia: Platelet count is improving.  Acute hypoxic respiratory failure, probable OSA: Continue BiPAP at night  Acute on chronic diastolic CHF: 2D echo showed normal EF, mild LVH, severely dilated left atrium, indeterminate LV diastolic parameters.   Aspiration pneumonia: Completed treatment  Acute metabolic encephalopathy: Improved  Other comorbidities include hypertension, NASH liver cirrhosis, hypothyroidism, type II DM     Diet Order             Diet regular Room service appropriate? Yes; Fluid consistency: Thin  Diet effective now  Consultants: Child psychotherapist  Procedures: Intubation on 11/13/2020 EGD and colonoscopy  Arch aortogram, selective cannulation of the left common carotid artery Amputation ray second metatarsal and, I&D deep abscess multiple fascial  planes, bone biopsy third metatarsal    Medications:    (feeding supplement) PROSource Plus  30 mL Oral TID BM   allopurinol  50 mg Oral QODAY   amLODipine  10 mg Oral Daily   apixaban  5 mg Oral BID   vitamin C  500 mg Oral BID   atorvastatin  40 mg Oral Daily   budesonide (PULMICORT) nebulizer solution  0.5 mg Nebulization BID   bumetanide  4 mg Oral Daily   feeding supplement  1 Container Oral TID BM   ferrous gluconate  324 mg Oral Q breakfast   fluconazole  200 mg Oral Daily   hydrALAZINE  100 mg Oral Q8H   levothyroxine  100 mcg Oral Q0600   mouth rinse  15 mL Mouth Rinse BID   metoprolol tartrate  50 mg Oral BID   multivitamin with minerals  1 tablet Oral Daily   pantoprazole  40 mg Oral BID   sodium chloride flush  3 mL Intravenous Q12H   sodium chloride flush  3 mL Intravenous Q12H   spironolactone  25 mg Oral Daily   venlafaxine  50 mg Oral BID   Continuous Infusions:  sodium chloride 10 mL/hr at 12/05/20 0604   sodium chloride Stopped (12/06/20 1004)   sodium chloride 250 mL (11/25/20 1208)   ampicillin-sulbactam (UNASYN) IV 3 g (12/07/20 0617)     Anti-infectives (From admission, onward)    Start     Dose/Rate Route Frequency Ordered Stop   12/05/20 1130  fluconazole (DIFLUCAN) tablet 200 mg        200 mg Oral Daily 12/05/20 1046 12/13/20 0959   11/30/20 1400  fluconazole (DIFLUCAN) IVPB 200 mg  Status:  Discontinued        200 mg 100 mL/hr over 60 Minutes Intravenous Every 24 hours 11/29/20 1633 12/05/20 1044   11/29/20 1800  fluconazole (DIFLUCAN) IVPB 400 mg        400 mg 100 mL/hr over 120 Minutes Intravenous  Once 11/29/20 1633 11/30/20 0724   11/29/20 1700  fluconazole (DIFLUCAN) IVPB 200 mg  Status:  Discontinued        200 mg 100 mL/hr over 60 Minutes Intravenous Every 24 hours 11/29/20 1611 11/29/20 1633   11/23/20 0000  ceFAZolin (ANCEF) IVPB 1 g/50 mL premix       Note to Pharmacy: To be given in specials   1 g 100 mL/hr over 30 Minutes  Intravenous  Once 11/22/20 1106 11/22/20 1750   11/14/20 2200  Ampicillin-Sulbactam (UNASYN) 3 g in sodium chloride 0.9 % 100 mL IVPB        3 g 200 mL/hr over 30 Minutes Intravenous Every 12 hours 11/14/20 1610     11/14/20 0900  piperacillin-tazobactam (ZOSYN) IVPB 2.25 g  Status:  Discontinued        2.25 g 100 mL/hr over 30 Minutes Intravenous Every 8 hours 11/14/20 0805 11/14/20 1609   11/13/20 2200  piperacillin-tazobactam (ZOSYN) IVPB 3.375 g  Status:  Discontinued        3.375 g 12.5 mL/hr over 240 Minutes Intravenous Every 12 hours 11/13/20 0758 11/14/20 0805   11/13/20 0600  piperacillin-tazobactam (ZOSYN) IVPB 2.25 g        2.25 g 100 mL/hr over 30 Minutes Intravenous Every 8 hours  11/12/20 2155 11/13/20 1725   11/11/20 1600  vancomycin (VANCOCIN) IVPB 1000 mg/200 mL premix  Status:  Discontinued        1,000 mg 200 mL/hr over 60 Minutes Intravenous Every 48 hours 11/09/20 1618 11/10/20 1045   11/11/20 1558  vancomycin (VANCOCIN) powder  Status:  Discontinued          As needed 11/11/20 1558 11/11/20 1558   11/10/20 2000  vancomycin (VANCOCIN) IVPB 1000 mg/200 mL premix  Status:  Discontinued        1,000 mg 200 mL/hr over 60 Minutes Intravenous Every 48 hours 11/10/20 1918 11/12/20 0940   11/09/20 1800  ceFEPIme (MAXIPIME) 2 g in sodium chloride 0.9 % 100 mL IVPB  Status:  Discontinued        2 g 200 mL/hr over 30 Minutes Intravenous Every 24 hours 11/09/20 1619 11/12/20 2146   11/09/20 1619  vancomycin variable dose per unstable renal function (pharmacist dosing)  Status:  Discontinued         Does not apply See admin instructions 11/09/20 1619 11/12/20 1521   11/09/20 1615  vancomycin (VANCOREADY) IVPB 1750 mg/350 mL        1,750 mg 175 mL/hr over 120 Minutes Intravenous  Once 11/09/20 1606 11/09/20 1947   11/09/20 1600  metroNIDAZOLE (FLAGYL) IVPB 500 mg  Status:  Discontinued        500 mg 100 mL/hr over 60 Minutes Intravenous Every 8 hours 11/09/20 1555 11/12/20 2146               Family Communication/Anticipated D/C date and plan/Code Status   DVT prophylaxis: Place and maintain sequential compression device Start: 11/15/20 1527 apixaban (ELIQUIS) tablet 5 mg     Code Status: Full Code  Family Communication: None Disposition Plan: Plan to discharge to SNF on Monday, 12/09/2020   Status is: Inpatient  Remains inpatient appropriate because: Awaiting placement to SNF           Subjective:   She has no complaints. No chest pain or shortness of breath  Objective:    Vitals:   12/07/20 0341 12/07/20 0405 12/07/20 0750 12/07/20 1103  BP: (!) 153/74  (!) 152/82 (!) 144/70  Pulse: (!) 52  (!) 57 (!) 52  Resp: 20  20 16   Temp: 98.2 F (36.8 C)  98.2 F (36.8 C) 98.2 F (36.8 C)  TempSrc: Oral     SpO2: 94%  95% 96%  Weight:  97.1 kg    Height:       No data found.   Intake/Output Summary (Last 24 hours) at 12/07/2020 1336 Last data filed at 12/07/2020 0548 Gross per 24 hour  Intake 1547.92 ml  Output 1000 ml  Net 547.92 ml   Filed Weights   12/04/20 0418 12/05/20 0614 12/07/20 0405  Weight: 95.4 kg 95.2 kg 97.1 kg    Exam:  GEN: NAD SKIN: Warm and dry EYES: No pallor or icterus ENT: MMM CV: RRR PULM: CTA B ABD: soft, ND, NT, +BS CNS: AAO x 3, non focal EXT: Left leg edema. Left foot surgical wound connected to wound VAC       Data Reviewed:   I have personally reviewed following labs and imaging studies:  Labs: Labs show the following:   Basic Metabolic Panel: Recent Labs  Lab 12/02/20 0548 12/03/20 0401 12/04/20 0416  NA 139 139 138  K 3.8 3.5 3.6  CL 104 107 107  CO2 25 25 24   GLUCOSE 113* 105*  122*  BUN 49* 50* 53*  CREATININE 3.53* 3.54* 3.57*  CALCIUM 8.5* 8.3* 8.2*   GFR Estimated Creatinine Clearance: 17.7 mL/min (A) (by C-G formula based on SCr of 3.57 mg/dL (H)). Liver Function Tests: No results for input(s): AST, ALT, ALKPHOS, BILITOT, PROT, ALBUMIN in the last 168  hours. No results for input(s): LIPASE, AMYLASE in the last 168 hours. No results for input(s): AMMONIA in the last 168 hours. Coagulation profile No results for input(s): INR, PROTIME in the last 168 hours.  CBC: Recent Labs  Lab 12/02/20 0548 12/03/20 0401 12/04/20 0416 12/07/20 0610  WBC 4.0 3.6* 3.9* 1.5*  HGB 8.8* 7.7* 8.5* 7.9*  HCT 27.5* 23.7* 26.4* 24.5*  MCV 94.2 94.0 92.6 93.2  PLT 70* 63* 84* 107*   Cardiac Enzymes: No results for input(s): CKTOTAL, CKMB, CKMBINDEX, TROPONINI in the last 168 hours. BNP (last 3 results) No results for input(s): PROBNP in the last 8760 hours. CBG: No results for input(s): GLUCAP in the last 168 hours.  D-Dimer: No results for input(s): DDIMER in the last 72 hours. Hgb A1c: No results for input(s): HGBA1C in the last 72 hours. Lipid Profile: No results for input(s): CHOL, HDL, LDLCALC, TRIG, CHOLHDL, LDLDIRECT in the last 72 hours. Thyroid function studies: No results for input(s): TSH, T4TOTAL, T3FREE, THYROIDAB in the last 72 hours.  Invalid input(s): FREET3 Anemia work up: No results for input(s): VITAMINB12, FOLATE, FERRITIN, TIBC, IRON, RETICCTPCT in the last 72 hours. Sepsis Labs: Recent Labs  Lab 12/02/20 0548 12/03/20 0401 12/04/20 0416 12/07/20 0610  WBC 4.0 3.6* 3.9* 1.5*    Microbiology Recent Results (from the past 240 hour(s))  Resp Panel by RT-PCR (Flu A&B, Covid) Nasopharyngeal Swab     Status: None   Collection Time: 12/04/20 11:07 AM   Specimen: Nasopharyngeal Swab; Nasopharyngeal(NP) swabs in vial transport medium  Result Value Ref Range Status   SARS Coronavirus 2 by RT PCR NEGATIVE NEGATIVE Final    Comment: (NOTE) SARS-CoV-2 target nucleic acids are NOT DETECTED.  The SARS-CoV-2 RNA is generally detectable in upper respiratory specimens during the acute phase of infection. The lowest concentration of SARS-CoV-2 viral copies this assay can detect is 138 copies/mL. A negative result does not  preclude SARS-Cov-2 infection and should not be used as the sole basis for treatment or other patient management decisions. A negative result may occur with  improper specimen collection/handling, submission of specimen other than nasopharyngeal swab, presence of viral mutation(s) within the areas targeted by this assay, and inadequate number of viral copies(<138 copies/mL). A negative result must be combined with clinical observations, patient history, and epidemiological information. The expected result is Negative.  Fact Sheet for Patients:  EntrepreneurPulse.com.au  Fact Sheet for Healthcare Providers:  IncredibleEmployment.be  This test is no t yet approved or cleared by the [place] FDA and  has been authorized for detection and/or diagnosis of SARS-CoV-2 by FDA under an Emergency Use Authorization (EUA). This EUA will remain  in effect (meaning this test can be used) for the duration of the COVID-19 declaration under Section 564(b)(1) of the Act, 21 U.S.C.section 360bbb-3(b)(1), unless the authorization is terminated  or revoked sooner.       Influenza A by PCR NEGATIVE NEGATIVE Final   Influenza B by PCR NEGATIVE NEGATIVE Final    Comment: (NOTE) The Xpert Xpress SARS-CoV-2/FLU/RSV plus assay is intended as an aid in the diagnosis of influenza from Nasopharyngeal swab specimens and should not be used as a sole basis  for treatment. Nasal washings and aspirates are unacceptable for Xpert Xpress SARS-CoV-2/FLU/RSV testing.  Fact Sheet for Patients: EntrepreneurPulse.com.au  Fact Sheet for Healthcare Providers: IncredibleEmployment.be  This test is not yet approved or cleared by the [place] FDA and has been authorized for detection and/or diagnosis of SARS-CoV-2 by FDA under an Emergency Use Authorization (EUA). This EUA will remain in effect (meaning this test can be used) for the  duration of the COVID-19 declaration under Section 564(b)(1) of the Act, 21 U.S.C. section 360bbb-3(b)(1), unless the authorization is terminated or revoked.  Performed at [facility], [address] [place], [place] [address]     Procedures and diagnostic studies:  No results found.             LOS: 28 days   [place] Copywriter, advertising on [email or web address]. If 7PM-7AM, please contact night-coverage at [email or web address]     12/07/2020, 1:36 PM

## 2020-12-08 DIAGNOSIS — D702 Other drug-induced agranulocytosis: Secondary | ICD-10-CM

## 2020-12-08 DIAGNOSIS — I4891 Unspecified atrial fibrillation: Secondary | ICD-10-CM

## 2020-12-08 DIAGNOSIS — D709 Neutropenia, unspecified: Secondary | ICD-10-CM | POA: Diagnosis not present

## 2020-12-08 DIAGNOSIS — D696 Thrombocytopenia, unspecified: Secondary | ICD-10-CM

## 2020-12-08 DIAGNOSIS — R6520 Severe sepsis without septic shock: Secondary | ICD-10-CM | POA: Diagnosis not present

## 2020-12-08 DIAGNOSIS — D649 Anemia, unspecified: Secondary | ICD-10-CM

## 2020-12-08 DIAGNOSIS — A419 Sepsis, unspecified organism: Secondary | ICD-10-CM | POA: Diagnosis not present

## 2020-12-08 DIAGNOSIS — M86172 Other acute osteomyelitis, left ankle and foot: Secondary | ICD-10-CM | POA: Diagnosis not present

## 2020-12-08 LAB — CBC WITH DIFFERENTIAL/PLATELET
Abs Immature Granulocytes: 0 10*3/uL (ref 0.00–0.07)
Basophils Absolute: 0 10*3/uL (ref 0.0–0.1)
Basophils Relative: 2 %
Eosinophils Absolute: 0 10*3/uL (ref 0.0–0.5)
Eosinophils Relative: 3 %
HCT: 25.4 % — ABNORMAL LOW (ref 36.0–46.0)
Hemoglobin: 8.2 g/dL — ABNORMAL LOW (ref 12.0–15.0)
Immature Granulocytes: 0 %
Lymphocytes Relative: 51 %
Lymphs Abs: 0.5 10*3/uL — ABNORMAL LOW (ref 0.7–4.0)
MCH: 29.6 pg (ref 26.0–34.0)
MCHC: 32.3 g/dL (ref 30.0–36.0)
MCV: 91.7 fL (ref 80.0–100.0)
Monocytes Absolute: 0.4 10*3/uL (ref 0.1–1.0)
Monocytes Relative: 41 %
Neutro Abs: 0 10*3/uL — CL (ref 1.7–7.7)
Neutrophils Relative %: 3 %
Platelets: 113 10*3/uL — ABNORMAL LOW (ref 150–400)
RBC: 2.77 MIL/uL — ABNORMAL LOW (ref 3.87–5.11)
RDW: 17.3 % — ABNORMAL HIGH (ref 11.5–15.5)
WBC: 1 10*3/uL — CL (ref 4.0–10.5)
nRBC: 0 % (ref 0.0–0.2)

## 2020-12-08 LAB — BASIC METABOLIC PANEL
Anion gap: 10 (ref 5–15)
BUN: 56 mg/dL — ABNORMAL HIGH (ref 8–23)
CO2: 23 mmol/L (ref 22–32)
Calcium: 8.5 mg/dL — ABNORMAL LOW (ref 8.9–10.3)
Chloride: 106 mmol/L (ref 98–111)
Creatinine, Ser: 3.5 mg/dL — ABNORMAL HIGH (ref 0.44–1.00)
GFR, Estimated: 14 mL/min — ABNORMAL LOW (ref >60)
Glucose, Bld: 112 mg/dL — ABNORMAL HIGH (ref 70–99)
Potassium: 3.6 mmol/L (ref 3.5–5.1)
Sodium: 139 mmol/L (ref 135–145)

## 2020-12-08 LAB — MAGNESIUM: Magnesium: 2.1 mg/dL (ref 1.7–2.4)

## 2020-12-08 LAB — TROPONIN I (HIGH SENSITIVITY)
Troponin I (High Sensitivity): 21 ng/L — ABNORMAL HIGH (ref <18)
Troponin I (High Sensitivity): 21 ng/L — ABNORMAL HIGH (ref <18)

## 2020-12-08 MED ORDER — METRONIDAZOLE 500 MG/100ML IV SOLN
500.0000 mg | Freq: Two times a day (BID) | INTRAVENOUS | Status: DC
  Administered 2020-12-08 – 2020-12-14 (×12): 500 mg via INTRAVENOUS
  Filled 2020-12-08 (×13): qty 100

## 2020-12-08 MED ORDER — POTASSIUM CHLORIDE CRYS ER 20 MEQ PO TBCR
40.0000 meq | EXTENDED_RELEASE_TABLET | Freq: Once | ORAL | Status: AC
  Administered 2020-12-08: 13:00:00 40 meq via ORAL
  Filled 2020-12-08: qty 2

## 2020-12-08 MED ORDER — TBO-FILGRASTIM 480 MCG/0.8ML ~~LOC~~ SOSY
480.0000 ug | PREFILLED_SYRINGE | Freq: Every day | SUBCUTANEOUS | Status: AC
  Administered 2020-12-08 – 2020-12-12 (×5): 480 ug via SUBCUTANEOUS
  Filled 2020-12-08 (×5): qty 0.8

## 2020-12-08 MED ORDER — CIPROFLOXACIN IN D5W 400 MG/200ML IV SOLN
400.0000 mg | INTRAVENOUS | Status: DC
  Administered 2020-12-08 – 2020-12-09 (×2): 400 mg via INTRAVENOUS
  Filled 2020-12-08 (×2): qty 200

## 2020-12-08 MED ORDER — DILTIAZEM HCL 25 MG/5ML IV SOLN
15.0000 mg | Freq: Once | INTRAVENOUS | Status: AC
  Administered 2020-12-08: 09:00:00 15 mg via INTRAVENOUS

## 2020-12-08 MED ORDER — DILTIAZEM HCL 30 MG PO TABS
30.0000 mg | ORAL_TABLET | Freq: Four times a day (QID) | ORAL | Status: DC
  Administered 2020-12-08 – 2020-12-09 (×2): 30 mg via ORAL
  Filled 2020-12-08 (×2): qty 1

## 2020-12-08 MED ORDER — VANCOMYCIN HCL 2000 MG/400ML IV SOLN
2000.0000 mg | Freq: Once | INTRAVENOUS | Status: AC
  Administered 2020-12-08: 13:00:00 2000 mg via INTRAVENOUS
  Filled 2020-12-08: qty 400

## 2020-12-08 MED ORDER — VANCOMYCIN HCL 750 MG/150ML IV SOLN: 750.0000 mg | INTRAVENOUS | Status: DC

## 2020-12-08 NOTE — TOC Progression Note (Signed)
Transition of Care [facility]) - Progression Note    Patient Details  Name: Autumn [name] MRN: [id number] Date of Birth: Feb 19, 1950  Transition of [facility]) CM/SW [place], LCSW Phone Number: 12/08/2020, 9:33 AM  Clinical Narrative:    Plan for SNF tomorrow. Asked MD and RN for COVID test today in preparation.   Expected Discharge Plan: [place] Barriers to Discharge: Continued Medical Work up  Expected Discharge Plan and Services Expected Discharge Plan: [place] arrangements for the past 2 months: Single Family Home                                       Social Determinants of Health (SDOH) Interventions    Readmission Risk Interventions No flowsheet data found.

## 2020-12-08 NOTE — Progress Notes (Addendum)
Progress Note    CEOLA PARA  FWY:[id number] DOB: 03-24-1950  DOA: 11/09/2020 PCP: Harlow Ohms, MD      Brief Narrative:    Medical records reviewed and are as summarized below:  Autumn [name] is a 70 y.o. 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  She'd been following with a [facility] podiatrist outpatient and had recently refused surgery.  She'd recently been treated with antibiotics at [facility] for the diabetic foot infection with enterobacter aerogans culture positive.  She was admitted on 10/29 due to falls at home with worsening L foot chronic diabetic ulcer with osteomyelitis of the 2nd and 3rd metatarsals and cellulitis noted on MRI.  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  Her post operative course was complicated by unresponsiveness and neurologic changes. She was found to have a stroke and was intubated for airway protection.  She was extubated 11/3 and transferred to [facility] service on 11/4.  Hospital course further complicated by acute on chronic anemia with melena after starting on anticoagulation, requiring blood transfusions and GI evaluation.       Assessment/Plan:   Principal Problem:   Sepsis ([place]) Active Problems:   Atrial fibrillation with RVR (HCC)   Osteomyelitis of foot, left, acute ([place])   CKD stage 4 due to type 2 diabetes mellitus ([place])   Liver cirrhosis secondary to NASH (nonalcoholic steatohepatitis) ([place])   Hypertension   History of anemia due to CKD   Frequent falls   AKI (acute kidney injury) ([place])   Cellulitis and abscess of foot, except toes   Infectious tenosynovitis   [place]   Atrial fibrillation with rapid ventricular response (HCC)   Bilateral carotid artery stenosis   Neutropenia (HCC)   Nutrition Problem: Increased nutrient needs Etiology: wound  healing  Signs/Symptoms: estimated needs   Body mass index is 34.66 kg/m.  (Obesity)  Septic shock from left diabetic foot infection, left foot osteomyelitis and cellulitis, septic arthritis of second MTP joint: S/p amputation ray second left metatarsal, I&D deep abscess multiple fascial planes of the left foot on 11/12/2020.  Patient now has severe neutropenia.  Case was discussed with ID specialist on-call, Dr. Tommy Medal.  He recommended substituting vancomycin, ciprofloxacin and Flagyl for Unasyn.  Dr. Steva Ready, ID, will see the patient tomorrow and provide additional recommendations.  Of note, surgical wound culture showed strep mitis/oralis, staff aureus, abundant Bacteroides species and rare Pasteurella multocida  Atrial fibrillation with RVR, chest discomfort/pressure, hypertension: She was given IV Cardizem bolus with improvement in heart rate.  Troponin was 21.  Second troponin is pending.  Plan to use IV Cardizem infusion for rate control if heart rate remains elevated.  Continue antihypertensives and Eliquis  Acute stroke with expressive aphasia and dysphagia: Continue Lipitor and Eliquis.  Continue dysphagia 3 diet.  Acute GI bleeding/melena on 11/26/2020: EGD on 11/28/2018 showed just an esophageal mucosa, gastritis, erythematous duodenopathy.  Colonoscopy 11/29/2020 showed multiple polyps that were removed but the colon prep was poor.  Continue Protonix.  Severe leukopenia/neutropenia: IV Unasyn has been discontinued.  Consulted hematologist, Dr. Janese Banks, for further recommendations  Esophageal candidiasis: Continue fluconazole through 201 2022.  Acute hypoxic respiratory failure, probable OSA: Continue BiPAP at night  Acute on chronic diastolic CHF: 2D echo showed normal EF, mild LVH, severely dilated left atrium,  indeterminate LV diastolic parameters.   Aspiration pneumonia: Completed treatment  Acute metabolic encephalopathy, thrombocytopenia: Improved  Other comorbidities  include CKD stage IV, NASH liver cirrhosis, hypothyroidism, type II DM     Diet Order             Diet regular Room service appropriate? Yes; Fluid consistency: Thin  Diet effective now                      Consultants: Gastroenterologist Intensivist Neurologist Infectious disease specialist  Procedures: Intubation on 11/13/2020 EGD and colonoscopy  Arch aortogram, selective cannulation of the left common carotid artery Amputation ray second metatarsal and, I&D deep abscess multiple fascial planes, bone biopsy third metatarsal    Medications:    (feeding supplement) PROSource Plus  30 mL Oral TID BM   allopurinol  50 mg Oral QODAY   amLODipine  10 mg Oral Daily   apixaban  5 mg Oral BID   vitamin C  500 mg Oral BID   atorvastatin  40 mg Oral Daily   budesonide (PULMICORT) nebulizer solution  0.5 mg Nebulization BID   bumetanide  4 mg Oral Daily   feeding supplement  1 Container Oral TID BM   ferrous gluconate  324 mg Oral Q breakfast   fluconazole  200 mg Oral Daily   hydrALAZINE  100 mg Oral Q8H   levothyroxine  100 mcg Oral Q0600   mouth rinse  15 mL Mouth Rinse BID   metoprolol tartrate  50 mg Oral BID   multivitamin with minerals  1 tablet Oral Daily   pantoprazole  40 mg Oral BID   sodium chloride flush  3 mL Intravenous Q12H   sodium chloride flush  3 mL Intravenous Q12H   spironolactone  25 mg Oral Daily   venlafaxine  50 mg Oral BID   Continuous Infusions:  sodium chloride 10 mL/hr at 12/05/20 0604   sodium chloride Stopped (12/06/20 1004)   sodium chloride 250 mL (11/25/20 1208)     Anti-infectives (From admission, onward)    Start     Dose/Rate Route Frequency Ordered Stop   12/05/20 1130  fluconazole (DIFLUCAN) tablet 200 mg        200 mg Oral Daily 12/05/20 1046 12/13/20 0959   11/30/20 1400  fluconazole (DIFLUCAN) IVPB 200 mg  Status:  Discontinued        200 mg 100 mL/hr over 60 Minutes Intravenous Every 24 hours 11/29/20 1633  12/05/20 1044   11/29/20 1800  fluconazole (DIFLUCAN) IVPB 400 mg        400 mg 100 mL/hr over 120 Minutes Intravenous  Once 11/29/20 1633 11/30/20 0724   11/29/20 1700  fluconazole (DIFLUCAN) IVPB 200 mg  Status:  Discontinued        200 mg 100 mL/hr over 60 Minutes Intravenous Every 24 hours 11/29/20 1611 11/29/20 1633   11/23/20 0000  ceFAZolin (ANCEF) IVPB 1 g/50 mL premix       Note to Pharmacy: To be given in specials   1 g 100 mL/hr over 30 Minutes Intravenous  Once 11/22/20 1106 11/22/20 1750   11/14/20 2200  Ampicillin-Sulbactam (UNASYN) 3 g in sodium chloride 0.9 % 100 mL IVPB  Status:  Discontinued        3 g 200 mL/hr over 30 Minutes Intravenous Every 12 hours 11/14/20 1610 12/08/20 1143   11/14/20 0900  piperacillin-tazobactam (ZOSYN) IVPB 2.25 g  Status:  Discontinued  2.25 g 100 mL/hr over 30 Minutes Intravenous Every 8 hours 11/14/20 0805 11/14/20 1609   11/13/20 2200  piperacillin-tazobactam (ZOSYN) IVPB 3.375 g  Status:  Discontinued        3.375 g 12.5 mL/hr over 240 Minutes Intravenous Every 12 hours 11/13/20 0758 11/14/20 0805   11/13/20 0600  piperacillin-tazobactam (ZOSYN) IVPB 2.25 g        2.25 g 100 mL/hr over 30 Minutes Intravenous Every 8 hours 11/12/20 2155 11/13/20 1725   11/11/20 1600  vancomycin (VANCOCIN) IVPB 1000 mg/200 mL premix  Status:  Discontinued        1,000 mg 200 mL/hr over 60 Minutes Intravenous Every 48 hours 11/09/20 1618 11/10/20 1045   11/11/20 1558  vancomycin (VANCOCIN) powder  Status:  Discontinued          As needed 11/11/20 1558 11/11/20 1558   11/10/20 2000  vancomycin (VANCOCIN) IVPB 1000 mg/200 mL premix  Status:  Discontinued        1,000 mg 200 mL/hr over 60 Minutes Intravenous Every 48 hours 11/10/20 1918 11/12/20 0940   11/09/20 1800  ceFEPIme (MAXIPIME) 2 g in sodium chloride 0.9 % 100 mL IVPB  Status:  Discontinued        2 g 200 mL/hr over 30 Minutes Intravenous Every 24 hours 11/09/20 1619 11/12/20 2146    11/09/20 1619  vancomycin variable dose per unstable renal function (pharmacist dosing)  Status:  Discontinued         Does not apply See admin instructions 11/09/20 1619 11/12/20 1521   11/09/20 1615  vancomycin (VANCOREADY) IVPB 1750 mg/350 mL        1,750 mg 175 mL/hr over 120 Minutes Intravenous  Once 11/09/20 1606 11/09/20 1947   11/09/20 1600  metroNIDAZOLE (FLAGYL) IVPB 500 mg  Status:  Discontinued        500 mg 100 mL/hr over 60 Minutes Intravenous Every 8 hours 11/09/20 1555 11/12/20 2146              Family Communication/Anticipated D/C date and plan/Code Status   DVT prophylaxis: Place and maintain sequential compression device Start: 11/15/20 1527 apixaban (ELIQUIS) tablet 5 mg     Code Status: Full Code  Family Communication: None Disposition Plan: Plan to discharge to SNF on Monday, 12/09/2020   Status is: Inpatient  Remains inpatient appropriate because: Awaiting placement to SNF           Subjective:   Interval events noted.  She complains of chest discomfort/pressure and shortness of breath.  No palpitations or dizziness.  Vickie, RN, was at the bedside.  Objective:    Vitals:   12/08/20 0446 12/08/20 0500 12/08/20 0816 12/08/20 1120  BP: (!) 137/94  (!) 155/95 (!) 151/99  Pulse: (!) 102  (!) 132 (!) 130  Resp: 18  20 20   Temp: 98.1 F (36.7 C)  98.6 F (37 C) 98.4 F (36.9 C)  TempSrc:      SpO2: 95%  95% 90%  Weight:  97.4 kg    Height:       No data found.   Intake/Output Summary (Last 24 hours) at 12/08/2020 1156 Last data filed at 12/08/2020 1020 Gross per 24 hour  Intake 250 ml  Output 1050 ml  Net -800 ml   Filed Weights   12/05/20 0614 12/07/20 0405 12/08/20 0500  Weight: 95.2 kg 97.1 kg 97.4 kg    Exam:  GEN: NAD SKIN: Warm and dry EYES: No pallor or icterus ENT:  MMM CV: Irregular rate and rhythm, tachycardic PULM: CTA B ABD: soft, ND, NT, +BS CNS: AAO x 3, non focal EXT: Mild left leg edema.  Left  foot surgical wound connected to wound VAC       Data Reviewed:   I have personally reviewed following labs and imaging studies:  Labs: Labs show the following:   Basic Metabolic Panel: Recent Labs  Lab 12/02/20 0548 12/03/20 0401 12/04/20 0416 12/08/20 0956  NA 139 139 138 139  K 3.8 3.5 3.6 3.6  CL 104 107 107 106  CO2 25 25 24 23   GLUCOSE 113* 105* 122* 112*  BUN 49* 50* 53* 56*  CREATININE 3.53* 3.54* 3.57* 3.50*  CALCIUM 8.5* 8.3* 8.2* 8.5*  MG  --   --   --  2.1   GFR Estimated Creatinine Clearance: 18.1 mL/min (A) (by C-G formula based on SCr of 3.5 mg/dL (H)). Liver Function Tests: No results for input(s): AST, ALT, ALKPHOS, BILITOT, PROT, ALBUMIN in the last 168 hours. No results for input(s): LIPASE, AMYLASE in the last 168 hours. No results for input(s): AMMONIA in the last 168 hours. Coagulation profile No results for input(s): INR, PROTIME in the last 168 hours.  CBC: Recent Labs  Lab 12/02/20 0548 12/03/20 0401 12/04/20 0416 12/07/20 0610 12/08/20 0956  WBC 4.0 3.6* 3.9* 1.5* 1.0*  NEUTROABS  --   --   --   --  PENDING  HGB 8.8* 7.7* 8.5* 7.9* 8.2*  HCT 27.5* 23.7* 26.4* 24.5* 25.4*  MCV 94.2 94.0 92.6 93.2 91.7  PLT 70* 63* 84* 107* 113*   Cardiac Enzymes: No results for input(s): CKTOTAL, CKMB, CKMBINDEX, TROPONINI in the last 168 hours. BNP (last 3 results) No results for input(s): PROBNP in the last 8760 hours. CBG: No results for input(s): GLUCAP in the last 168 hours.  D-Dimer: No results for input(s): DDIMER in the last 72 hours. Hgb A1c: No results for input(s): HGBA1C in the last 72 hours. Lipid Profile: No results for input(s): CHOL, HDL, LDLCALC, TRIG, CHOLHDL, LDLDIRECT in the last 72 hours. Thyroid function studies: No results for input(s): TSH, T4TOTAL, T3FREE, THYROIDAB in the last 72 hours.  Invalid input(s): FREET3 Anemia work up: No results for input(s): VITAMINB12, FOLATE, FERRITIN, TIBC, IRON, RETICCTPCT in the  last 72 hours. Sepsis Labs: Recent Labs  Lab 12/03/20 0401 12/04/20 0416 12/07/20 0610 12/08/20 0956  WBC 3.6* 3.9* 1.5* 1.0*    Microbiology Recent Results (from the past 240 hour(s))  Resp Panel by RT-PCR (Flu A&B, Covid) Nasopharyngeal Swab     Status: None   Collection Time: 12/04/20 11:07 AM   Specimen: Nasopharyngeal Swab; Nasopharyngeal(NP) swabs in vial transport medium  Result Value Ref Range Status   SARS Coronavirus 2 by RT PCR NEGATIVE NEGATIVE Final    Comment: (NOTE) SARS-CoV-2 target nucleic acids are NOT DETECTED.  The SARS-CoV-2 RNA is generally detectable in upper respiratory specimens during the acute phase of infection. The lowest concentration of SARS-CoV-2 viral copies this assay can detect is 138 copies/mL. A negative result does not preclude SARS-Cov-2 infection and should not be used as the sole basis for treatment or other patient management decisions. A negative result may occur with  improper specimen collection/handling, submission of specimen other than nasopharyngeal swab, presence of viral mutation(s) within the areas targeted by this assay, and inadequate number of viral copies(<138 copies/mL). A negative result must be combined with clinical observations, patient history, and epidemiological information. The expected result is Negative.  Fact Sheet for Patients:  EntrepreneurPulse.com.au  Fact Sheet for Healthcare Providers:  IncredibleEmployment.be  This test is no t yet approved or cleared by the [place] FDA and  has been authorized for detection and/or diagnosis of SARS-CoV-2 by FDA under an Emergency Use Authorization (EUA). This EUA will remain  in effect (meaning this test can be used) for the duration of the COVID-19 declaration under Section 564(b)(1) of the Act, 21 U.S.C.section 360bbb-3(b)(1), unless the authorization is terminated  or revoked sooner.       Influenza A by PCR  NEGATIVE NEGATIVE Final   Influenza B by PCR NEGATIVE NEGATIVE Final    Comment: (NOTE) The Xpert Xpress SARS-CoV-2/FLU/RSV plus assay is intended as an aid in the diagnosis of influenza from Nasopharyngeal swab specimens and should not be used as a sole basis for treatment. Nasal washings and aspirates are unacceptable for Xpert Xpress SARS-CoV-2/FLU/RSV testing.  Fact Sheet for Patients: EntrepreneurPulse.com.au  Fact Sheet for Healthcare Providers: IncredibleEmployment.be  This test is not yet approved or cleared by the [place] FDA and has been authorized for detection and/or diagnosis of SARS-CoV-2 by FDA under an Emergency Use Authorization (EUA). This EUA will remain in effect (meaning this test can be used) for the duration of the COVID-19 declaration under Section 564(b)(1) of the Act, 21 U.S.C. section 360bbb-3(b)(1), unless the authorization is terminated or revoked.  Performed at [facility], [address] [place], [place] [address]     Procedures and diagnostic studies:  No results found.             LOS: 29 days   [employer], advertising on [email or web address]. If 7PM-7AM, please contact night-coverage at [email or web address]     12/08/2020, 11:56 AM

## 2020-12-08 NOTE — Progress Notes (Signed)
      INFECTIOUS DISEASE ATTENDING ADDENDUM:   Date: 12/08/2020  Patient name: Autumn [name]  Medical record number: [id number]  Date of birth: [birth year]   I was called by Dr. 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  Is developed profound leukopenia while on IV Unasyn as well as fluconazole for esophageal candidiasis.  I suspect the beta-lactam is causing his leukopenia and recommended switching antibiotics to vancomycin ciprofloxacin and metronidazole to cover Streptococcus mitis paralysis, Pasteurella multocida and beta-lactamase producing Bacteroides species.  Mythology oncology concerned about fluconazole also being a culprit certainly this could be stopped as well since the patient is already had 9 days of nasal therapy I will stop it now.  Dr. Steva Ready will see the patient formally tomorrow  Rhina Brackett [facility] 12/08/2020, 6:42 PM

## 2020-12-08 NOTE — Consult Note (Signed)
Hematology/Oncology Consult note [facility] [phone] [phone]  Patient Care Team: Harlow Ohms, MD as PCP - General (Geriatric Medicine) End, Harrell Gave, MD as PCP - Cardiology (Cardiology)   Name of the patient: Autumn [name]  [id number]  08-03-1950    Reason for consult: pancytopenia   Requesting physician: Dr.Ayiku  Date of visit:12/08/2020    History of presenting illness- Patient is a 70 year old female with a past medical history significant for stage IV chronic kidney disease, history of liver cirrhosis, hypertension and diabetes and left foot diabetic ulcer.  She was brought to the hospital in with multiple falls.  In the ER she was found to have rapid A. fib.  She was recently treated at [facility] for diabetic foot infection and Enterobacter Ehrmann's culture was positive.  She was admitted in late October for worsening left foot diabetic ulcer with osteomyelitis s/p second toe amputation and I&D of the left foot with bone biopsy of the left third metatarsal.  This was then complicated by a stroke and she was intubated for airway protection.  She was extubated on 11/14/2020.  Hematology consulted for worsening neutropenia.  Patient has had a prolonged hospitalization for about a month now and she presented with a white cell count of 15.8, H&H of 8/24.5 with a normal platelet count of 193.  Her platelet count subsequently gradually drifted down to a low of 63 and has been gradually improving since then presently at 113.  Her white cell count which was elevated at 15 on admission gradually started declining since 11/27/2020 and is presently 1 with an [place] of 0.  She is currently on fluconazole daily as well as Flagyl and vancomycin  ECOG PS- 3  Pain scale- 3   Review of systems- Review of Systems  Constitutional:  Positive for malaise/fatigue. Negative for chills, fever and weight loss.  HENT:  Negative for congestion, ear discharge  and nosebleeds.   Eyes:  Negative for blurred vision.  Respiratory:  Negative for cough, hemoptysis, sputum production, shortness of breath and wheezing.   Cardiovascular:  Negative for chest pain, palpitations, orthopnea and claudication.  Gastrointestinal:  Negative for abdominal pain, blood in stool, constipation, diarrhea, heartburn, melena, nausea and vomiting.  Genitourinary:  Negative for dysuria, flank pain, frequency, hematuria and urgency.  Musculoskeletal:  Negative for back pain, joint pain and myalgias.  Skin:  Negative for rash.  Neurological:  Negative for dizziness, tingling, focal weakness, seizures, weakness and headaches.  Endo/Heme/Allergies:  Does not bruise/bleed easily.  Psychiatric/Behavioral:  Negative for depression and suicidal ideas. The patient does not have insomnia.    Allergies  Allergen Reactions   Codeine Nausea And Vomiting    Patient Active Problem List   Diagnosis Date Noted   Neutropenia ([place]) 12/08/2020   Bilateral carotid artery stenosis    Atrial fibrillation with rapid ventricular response (HCC)    Cellulitis and abscess of foot, except toes    Infectious tenosynovitis    Wheeze    AKI (acute kidney injury) ([place])    Anemia of chronic disease    Hypothyroidism    Atrial fibrillation with RVR ([place]) 11/09/2020   Osteomyelitis of foot, left, acute ([place]) 11/09/2020   CKD stage 4 due to type 2 diabetes mellitus ([place]) 11/09/2020   Liver cirrhosis secondary to NASH (nonalcoholic steatohepatitis) ([place]) 11/09/2020   Hypertension    History of anemia due to CKD    Sepsis ([place])    Frequent falls  Past Medical History:  Diagnosis Date   Diabetes mellitus without complication ([place])    Hypertension    Kidney stone    Thyroid disease      Past Surgical History:  Procedure Laterality Date   ABDOMINAL HYSTERECTOMY     AMPUTATION Left 11/11/2020   Procedure: AMPUTATION RAY - 2nd metatarsal and 3rd metatarsal head;  Surgeon: [name], DPM;  Location: [facility] ORS;  Service: Podiatry;  Laterality: Left;   CAROTID ANGIOGRAPHY Left 11/22/2020   Procedure: CAROTID ANGIOGRAPHY;  Surgeon: Katha Cabal, MD;  Location: [place] [facility];  Service: Cardiovascular;  Laterality: Left;   COLONOSCOPY WITH PROPOFOL N/A 11/29/2020   Procedure: COLONOSCOPY WITH PROPOFOL;  Surgeon: Lesly Rubenstein, MD;  Location: [facility] ENDOSCOPY;  Service: Endoscopy;  Laterality: N/A;   ESOPHAGOGASTRODUODENOSCOPY (EGD) WITH PROPOFOL N/A 11/27/2020   Procedure: ESOPHAGOGASTRODUODENOSCOPY (EGD) WITH PROPOFOL;  Surgeon: Lesly Rubenstein, MD;  Location: [facility] ENDOSCOPY;  Service: Endoscopy;  Laterality: N/A;   URETERAL STENT PLACEMENT      Social History   Socioeconomic History   Marital status: Single    Spouse name: Not on file   Number of children: Not on file   Years of education: Not on file   Highest education level: Not on file  Occupational History   Not on file  Tobacco Use   Smoking status: Former   Smokeless tobacco: Never  Substance and Sexual Activity   Alcohol use: Never   Drug use: Never   Sexual activity: Not on file  Other Topics Concern   Not on file  Social History Narrative   Not on file   Social Determinants of Health   Financial Resource Strain: Not on file  Food Insecurity: Not on file  Transportation Needs: Not on file  Physical Activity: Not on file  Stress: Not on file  Social Connections: Not on file  Intimate Partner Violence: Not on file     Family History  Problem Relation Age of Onset   Hypertension Mother      Current Facility-Administered Medications:    (feeding supplement) PROSource Plus liquid 30 mL, 30 mL, Oral, TID BM, Griffith, Kelly A, DO, 30 mL at 12/07/20 2246   0.9 %  sodium chloride infusion, , Intravenous, PRN, Schnier, Dolores Lory, MD, Last Rate: 10 mL/hr at 12/05/20 0604, New Bag at 12/05/20 0604   0.9 %  sodium chloride infusion, 250 mL, Intravenous, PRN, Schnier, Dolores Lory,  MD, Stopped at 12/06/20 1004   0.9 %  sodium chloride infusion, 250 mL, Intravenous, PRN, Schnier, Dolores Lory, MD, Last Rate: 10 mL/hr at 11/25/20 1208, 250 mL at 11/25/20 1208   [DISCONTINUED] acetaminophen (TYLENOL) tablet 650 mg, 650 mg, Oral, Q6H PRN **OR** acetaminophen (TYLENOL) suppository 650 mg, 650 mg, Rectal, Q6H PRN, Schnier, Dolores Lory, MD   acetaminophen (TYLENOL) tablet 650 mg, 650 mg, Per Tube, Q6H PRN, Schnier, Dolores Lory, MD   albuterol (PROVENTIL) (2.5 MG/3ML) 0.083% nebulizer solution 2.5 mg, 2.5 mg, Nebulization, Q4H PRN, Schnier, Dolores Lory, MD, 2.5 mg at 12/08/20 1141   allopurinol (ZYLOPRIM) tablet 50 mg, 50 mg, Oral, QODAY, Beers, Brandon D, RPH, 50 mg at 12/08/20 1147   amLODipine (NORVASC) tablet 10 mg, 10 mg, Oral, Daily, Jennye Boroughs, MD, 10 mg at 12/08/20 1147   apixaban (ELIQUIS) tablet 5 mg, 5 mg, Oral, BID, Nicole Kindred A, DO, 5 mg at 12/08/20 1014   ascorbic acid (VITAMIN C) tablet 500 mg, 500 mg, Oral, BID, Enzo Bi,  MD, 500 mg at 12/07/20 2246   atorvastatin (LIPITOR) tablet 40 mg, 40 mg, Oral, Daily, Theora Gianotti, NP, 40 mg at 12/07/20 0835   budesonide (PULMICORT) nebulizer solution 0.5 mg, 0.5 mg, Nebulization, BID, Schnier, Dolores Lory, MD, 0.5 mg at 12/08/20 8309   bumetanide (BUMEX) tablet 4 mg, 4 mg, Oral, Daily, Kolluru, Sarath, MD, 4 mg at 12/08/20 1148   ciprofloxacin (CIPRO) IVPB 400 mg, 400 mg, Intravenous, Q24H, Jennye Boroughs, MD   diltiazem (CARDIZEM) injection 5 mg, 5 mg, Intravenous, Q2H PRN, Schnier, Dolores Lory, MD, 5 mg at 12/08/20 4076   docusate (COLACE) 50 MG/5ML liquid 100 mg, 100 mg, Oral, BID PRN, Nicole Kindred A, DO   feeding supplement (BOOST / RESOURCE BREEZE) liquid 1 Container, 1 Container, Oral, TID BM, Ezekiel Slocumb, DO, 1 Container at 12/07/20 1336   ferrous gluconate (FERGON) tablet 324 mg, 324 mg, Oral, Q breakfast, Schnier, Dolores Lory, MD, 324 mg at 12/08/20 1148   fluconazole (DIFLUCAN) tablet 200 mg, 200 mg,  Oral, Daily, Jennye Boroughs, MD, 200 mg at 12/08/20 1149   hydrALAZINE (APRESOLINE) injection 5 mg, 5 mg, Intravenous, Q6H PRN, Schnier, Dolores Lory, MD, 5 mg at 11/22/20 1350   hydrALAZINE (APRESOLINE) tablet 100 mg, 100 mg, Oral, Q8H, Nicole Kindred A, DO, 100 mg at 12/08/20 1305   levothyroxine (SYNTHROID) tablet 100 mcg, 100 mcg, Oral, Q0600, Enzo Bi, MD, 100 mcg at 12/08/20 8088   loperamide (IMODIUM) capsule 2 mg, 2 mg, Oral, PRN, Ezekiel Slocumb, DO, 2 mg at 12/02/20 1555   meclizine (ANTIVERT) tablet 12.5 mg, 12.5 mg, Per Tube, TID PRN, Schnier, Dolores Lory, MD   MEDLINE mouth rinse, 15 mL, Mouth Rinse, BID, Schnier, Dolores Lory, MD, 15 mL at 12/07/20 2246   metoprolol tartrate (LOPRESSOR) tablet 50 mg, 50 mg, Oral, BID, Enzo Bi, MD, 50 mg at 12/08/20 1014   metroNIDAZOLE (FLAGYL) IVPB 500 mg, 500 mg, Intravenous, Q12H, Jennye Boroughs, MD   multivitamin with minerals tablet 1 tablet, 1 tablet, Oral, Daily, Schnier, Dolores Lory, MD, 1 tablet at 12/07/20 0836   ondansetron (ZOFRAN) tablet 4 mg, 4 mg, Per Tube, Q6H PRN **OR** ondansetron (ZOFRAN) injection 4 mg, 4 mg, Intravenous, Q6H PRN, Schnier, Dolores Lory, MD, 4 mg at 12/08/20 0755   pantoprazole (PROTONIX) EC tablet 40 mg, 40 mg, Oral, BID, Dorothe Pea, RPH, 40 mg at 12/08/20 1147   polyethylene glycol (MIRALAX / GLYCOLAX) packet 17 g, 17 g, Oral, Daily PRN, Nicole Kindred A, DO   sodium chloride flush (NS) 0.9 % injection 3 mL, 3 mL, Intravenous, Q12H, Schnier, Dolores Lory, MD, 3 mL at 12/08/20 1135   sodium chloride flush (NS) 0.9 % injection 3 mL, 3 mL, Intravenous, PRN, Schnier, Dolores Lory, MD   sodium chloride flush (NS) 0.9 % injection 3 mL, 3 mL, Intravenous, Q12H, Schnier, Dolores Lory, MD, 3 mL at 12/08/20 1134   sodium chloride flush (NS) 0.9 % injection 3 mL, 3 mL, Intravenous, PRN, Schnier, Dolores Lory, MD   spironolactone (ALDACTONE) tablet 25 mg, 25 mg, Oral, Daily, Nicole Kindred A, DO, 25 mg at 12/08/20 1147   Tbo-Filgrastim  (GRANIX) injection 480 mcg, 480 mcg, Subcutaneous, Daily, Sindy Guadeloupe, MD   [START ON 12/10/2020] vancomycin (VANCOREADY) IVPB 750 mg/150 mL, 750 mg, Intravenous, Q48H, Dorothe Pea, RPH   venlafaxine [facility]) tablet 50 mg, 50 mg, Oral, BID, Lorna Dibble, RPH, 50 mg at 12/08/20 1146   Physical exam:  Vitals:   12/08/20  0500 12/08/20 0816 12/08/20 1120 12/08/20 1419  BP:  (!) 155/95 (!) 151/99   Pulse:  (!) 132 (!) 130 99  Resp:  20 20   Temp:  98.6 F (37 C) 98.4 F (36.9 C)   TempSrc:      SpO2:  95% 90%   Weight: 214 lb 11.7 oz (97.4 kg)     Height:       Physical Exam Constitutional:      General: She is not in acute distress. Cardiovascular:     Rate and Rhythm: Normal rate and regular rhythm.     Heart sounds: Normal heart sounds.  Pulmonary:     Effort: Pulmonary effort is normal.     Breath sounds: Normal breath sounds.  Abdominal:     General: Bowel sounds are normal.     Palpations: Abdomen is soft.  Musculoskeletal:     Comments: Dressing in place over left foot  Skin:    General: Skin is warm and dry.  Neurological:     Mental Status: She is alert and oriented to person, place, and time.       CMP Latest Ref Rng & Units 12/08/2020  Glucose 70 - 99 mg/dL 112(H)  BUN 8 - 23 mg/dL 56(H)  Creatinine 0.44 - 1.00 mg/dL 3.50(H)  Sodium 135 - 145 mmol/L 139  Potassium 3.5 - 5.1 mmol/L 3.6  Chloride 98 - 111 mmol/L 106  CO2 22 - 32 mmol/L 23  Calcium 8.9 - 10.3 mg/dL 8.5(L)  Total Protein 6.5 - 8.1 g/dL -  Total Bilirubin 0.3 - 1.2 mg/dL -  Alkaline Phos 38 - 126 U/L -  AST 15 - 41 U/L -  ALT 0 - 44 U/L -   CBC Latest Ref Rng & Units 12/08/2020  WBC 4.0 - 10.5 K/uL 1.0(LL)  Hemoglobin 12.0 - 15.0 g/dL 8.2(L)  Hematocrit 36.0 - 46.0 % 25.4(L)  Platelets 150 - 400 K/uL 113(L)    '@IMAGES' @  DG Abd 1 View  Result Date: 11/15/2020 CLINICAL DATA:  Evaluate feeding catheter placement EXAM: ABDOMEN - 1 VIEW COMPARISON:  Film from earlier in the  same day. FINDINGS: Catheter is been manipulated and now lies in the mid to distal stomach directed towards the pylorus. IMPRESSION: Gastric catheter within the mid to distal stomach directed towards the pylorus. Electronically Signed   By: Inez Catalina M.D.   On: 11/15/2020 22:23   DG Abd 1 View  Result Date: 11/15/2020 CLINICAL DATA:  Evaluate feeding catheter EXAM: ABDOMEN - 1 VIEW COMPARISON:  None. FINDINGS: Scattered large and small bowel gas is noted. Weighted feeding catheter is noted coiled in the mid stomach. No free air is seen. IMPRESSION: Feeding catheter in the mid stomach. Electronically Signed   By: Inez Catalina M.D.   On: 11/15/2020 19:37   CT HEAD WO CONTRAST (5MM)  Result Date: 11/21/2020 CLINICAL DATA:  Altered mental status EXAM: CT HEAD WITHOUT CONTRAST TECHNIQUE: Contiguous axial images were obtained from the base of the skull through the vertex without intravenous contrast. COMPARISON:  Previous studies including the examination of 11/19/2020 FINDINGS: Brain: There are no signs of bleeding within the cranium. There is small area of encephalomalacia in the left frontal cortex suggests recent infarct. There is no focal mass effect. Cortical sulci are prominent. There is decreased density in periventricular and subcortical white matter. Vascular: Unremarkable. Skull: Unremarkable. Sinuses/Orbits: Unremarkable. Other: None IMPRESSION: There are no signs of bleeding within the cranium. No significant changes are noted  in recent infarct in the left frontal cortex. Atrophy. Small-vessel disease. Electronically Signed   By: Elmer Picker M.D.   On: 11/21/2020 09:40   CT HEAD WO CONTRAST (5MM)  Result Date: 11/19/2020 CLINICAL DATA:  Stroke follow-up.  Rule out hemorrhage. EXAM: CT HEAD WITHOUT CONTRAST TECHNIQUE: Contiguous axial images were obtained from the base of the skull through the vertex without intravenous contrast. COMPARISON:  Brain MRI dated 11/19/2020.  Head CT dated  11/13/2020. FINDINGS: Brain: Mild age-related atrophy and chronic microvascular ischemic changes. Focal area of white matter hypodensity in the left frontal convexity noted which appears more conspicuous compared to the CT of 11/13/2020. Or there is no acute intracranial hemorrhage. No mass effect midline shift no extra-axial fluid collection. Vascular: No hyperdense vessel or unexpected calcification. Skull: Normal. Negative for fracture or focal lesion. Sinuses/Orbits: No acute finding. Other: None IMPRESSION: 1. No acute intracranial hemorrhage. 2. Mild age-related atrophy and chronic microvascular ischemic changes. 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 Electronically Signed   By: Anner Crete M.D.   On: 11/19/2020 22:35   CT HEAD WO CONTRAST (5MM)  Result Date: 11/13/2020 CLINICAL DATA:  Mental status changes EXAM: CT HEAD WITHOUT CONTRAST TECHNIQUE: Contiguous axial images were obtained from the base of the skull through the vertex without intravenous contrast. COMPARISON:  11/09/2020 FINDINGS: Brain: stable atrophy pattern and chronic white matter microvascular ischemic changes throughout both cerebral hemispheres. Limited with some motion artifact. No acute intracranial hemorrhage, mass lesion, new infarction, midline shift, herniation, hydrocephalus, or extra-axial fluid collection. No focal mass effect or edema. Cisterns are patent. Cerebellar atrophy as well. Vascular: Intracranial atherosclerosis at the skull base. No hyperdense vessel. Skull: Normal. Negative for fracture or focal lesion. Sinuses/Orbits: No acute finding. Other: None. IMPRESSION: Stable atrophy and white matter microvascular ischemic changes. No acute intracranial abnormality by noncontrast CT. Electronically Signed   By: Jerilynn Mages.  Shick M.D.   On: 11/13/2020 09:58   CT Head Wo Contrast  Result Date: 11/09/2020 CLINICAL DATA:  70 year old female with acute altered mental status. EXAM: CT HEAD WITHOUT  CONTRAST TECHNIQUE: Contiguous axial images were obtained from the base of the skull through the vertex without intravenous contrast. COMPARISON:  None. FINDINGS: Brain: No evidence of acute infarction, hemorrhage, hydrocephalus, extra-axial collection or mass lesion/mass effect. Vascular: Carotid and vertebral atherosclerotic calcifications are noted. Skull: No acute abnormality Sinuses/Orbits: No acute abnormality Other: None IMPRESSION: No evidence of acute intracranial abnormality. Electronically Signed   By: Margarette Canada M.D.   On: 11/09/2020 13:05   MR ANGIO HEAD WO CONTRAST  Result Date: 11/19/2020 CLINICAL DATA:  Stroke, follow up. EXAM: MRA NECK WITHOUT CONTRAST MRA HEAD WITHOUT CONTRAST TECHNIQUE: Angiographic images of the Circle of Willis were acquired using MRA technique without intravenous contrast. COMPARISON:  None. FINDINGS: MRA NECK FINDINGS The study is degraded by motion, particularly at the skull base. Luminal irregularity along the petrous segment of the bilateral internal carotid arteries with apparent high-grade stenosis, likely artifactual. Mild luminal irregularity of the cavernous segment of the bilateral internal carotid arteries. Normal caliber and flow related enhancement of the bilateral ACA vascular trees. Attenuation of flow in the M3/MCA branches bilaterally is likely artifactual. Normal caliber and flow related enhancement of the visualized portions of the V4 segment of the bilateral vertebral arteries. The basilar artery and bilateral posterior cerebral arteries are maintained. MRA HEAD FINDINGS The study is severely degraded by motion, particularly at the level of the carotid bifurcations. Normal flow  related enhancement seen in the visualized portions of the bilateral common carotid artery. Attenuation of flow at the level of the carotid bulbs and in the distal cervical segment may be artifactual. Note is made of retropharyngeal course of the bilateral common carotid arteries  and proximal cervical internal carotid arteries. Normal caliber and flow related enhancement of the visualized V2 segment of the bilateral vertebral arteries. Attenuation of flow at the V3 segment is artifactual. IMPRESSION: 1. Motion degraded study significantly limiting evaluation. Consider repeat study or CT angiogram of the head and neck when clinically appropriate. 2. Luminal irregularity with high-grade stenosis in the petrous segment of the bilateral internal carotid arteries are likely artifactual. Likewise, attenuation of flow in the bilateral M3/MCA segments are likely artifactual. However, superimposed atherosclerotic stenosis cannot be excluded. 3. 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 4. Normal caliber of the visualized portions of the common carotid arteries and proximal carotid bifurcation with attenuation of flow in the bulbs and in the distal cervical segments, likely artifactual. Electronically Signed   By: Pedro Earls M.D.   On: 11/19/2020 15:41   MR ANGIO NECK WO CONTRAST  Result Date: 11/19/2020 CLINICAL DATA:  Stroke, follow up. EXAM: MRA NECK WITHOUT CONTRAST MRA HEAD WITHOUT CONTRAST TECHNIQUE: Angiographic images of the Circle of Willis were acquired using MRA technique without intravenous contrast. COMPARISON:  None. FINDINGS: MRA NECK FINDINGS The study is degraded by motion, particularly at the skull base. Luminal irregularity along the petrous segment of the bilateral internal carotid arteries with apparent high-grade stenosis, likely artifactual. Mild luminal irregularity of the cavernous segment of the bilateral internal carotid arteries. Normal caliber and flow related enhancement of the bilateral ACA vascular trees. Attenuation of flow in the M3/MCA branches bilaterally is likely artifactual. Normal caliber and flow related enhancement of the visualized portions of the V4 segment of the bilateral vertebral arteries. The basilar  artery and bilateral posterior cerebral arteries are maintained. MRA HEAD FINDINGS The study is severely degraded by motion, particularly at the level of the carotid bifurcations. Normal flow related enhancement seen in the visualized portions of the bilateral common carotid artery. Attenuation of flow at the level of the carotid bulbs and in the distal cervical segment may be artifactual. Note is made of retropharyngeal course of the bilateral common carotid arteries and proximal cervical internal carotid arteries. Normal caliber and flow related enhancement of the visualized V2 segment of the bilateral vertebral arteries. Attenuation of flow at the V3 segment is artifactual. IMPRESSION: 1. Motion degraded study significantly limiting evaluation. Consider repeat study or CT angiogram of the head and neck when clinically appropriate. 2. Luminal irregularity with high-grade stenosis in the petrous segment of the bilateral internal carotid arteries are likely artifactual. Likewise, attenuation of flow in the bilateral M3/MCA segments are likely artifactual. However, superimposed atherosclerotic stenosis cannot be excluded. 3. 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 4. Normal caliber of the visualized portions of the common carotid arteries and proximal carotid bifurcation with attenuation of flow in the bulbs and in the distal cervical segments, likely artifactual. Electronically Signed   By: Pedro Earls M.D.   On: 11/19/2020 15:41   MR BRAIN WO CONTRAST  Result Date: 11/13/2020 CLINICAL DATA:  Mental status changes of unknown cause. Recent fall at home. EXAM: MRI HEAD WITHOUT CONTRAST TECHNIQUE: Multiplanar, multiecho pulse sequences of the brain and surrounding structures were obtained without intravenous contrast. COMPARISON:  Head CT 11/13/2020 FINDINGS: Brain: Diffusion imaging  shows an acute to subacute infarction affecting the left posterior frontal cortical and  subcortical brain, potentially the precentral gyrus. No other acute finding. No sign of hemorrhage or mass effect associated with that. Otherwise, there chronic small-vessel ischemic changes of the pons. Cerebral hemispheres show moderate chronic small-vessel ischemic changes of the white matter. No mass lesion, hemorrhage, hydrocephalus or extra-axial collection. Few punctate foci of hemosiderin deposition noted in the pons and cerebral hemispheres. Vascular: Major vessels at the base of the brain show flow. 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 No mass effect or hemorrhage. Chronic small-vessel ischemic changes elsewhere affecting the brainstem and cerebral hemispheric white matter, some associated with chronic punctate hemosiderin deposition. Electronically Signed   By: Nelson Chimes M.D.   On: 11/13/2020 16:36   US RENAL  Result Date: 11/09/2020 CLINICAL DATA:  Acute kidney injury. EXAM: RENAL / URINARY TRACT ULTRASOUND COMPLETE COMPARISON:  None. FINDINGS: Right Kidney: Renal measurements: 11.3 x 5.2 x 4.5 cm = volume: 138 mL. Increased parenchymal echogenicity. No hydronephrosis. No visualized cystic or solid lesion. No stone. Left Kidney: Renal measurements: 8.7 x 4.9 x 5.4 cm = volume: 120 mL. Increased parenchymal echogenicity. No hydronephrosis. There is a 1 cm cyst in the upper pole. No evidence of solid lesion or stone. Bladder: Appears normal for degree of bladder distention. Other: Incidental splenomegaly with spleen spanning 14.3 x 12.4 x 5.6 cm, splenic volume of 517 cc. IMPRESSION: 1. Increased bilateral renal parenchymal echogenicity consistent with chronic medical renal disease. No obstructive uropathy. 2. Simple cyst in the upper left kidney. 3. Incidental splenomegaly. Electronically Signed   By:  Keith Rake M.D.   On: 11/09/2020 17:12   MR FOOT LEFT WO CONTRAST  Result Date: 11/10/2020 CLINICAL DATA:  Foot pain and swelling. EXAM: MRI OF THE LEFT FOOT WITHOUT CONTRAST TECHNIQUE: Multiplanar, multisequence MR imaging of the left foot was performed. No intravenous contrast was administered. COMPARISON:  None. FINDINGS: Bones/Joint/Cartilage Soft tissue wound along the plantar aspect of the forefoot overlying the second metatarsal. Bone destruction of the second metatarsal head with severe bone marrow edema throughout the second metatarsal. Bone destruction of the second proximal phalanx. Severe soft tissue edema around the second MTP joint. Bone marrow edema in the third metatarsal head. No acute fracture. Dorsal dislocation of the second proximal phalanx. 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 Ligaments Collateral ligaments are intact.  Lisfranc ligament is intact. Muscles and Tendons Flexor, peroneal and extensor compartment tendons are intact. Generalized muscle atrophy. Soft tissue Severe soft tissue swelling around the second metatarsal neck concerning for a phlegmon/developing abscess measuring 2.1 x 1 cm circumferentially around the distal shaft. No soft tissue mass. Severe soft tissue edema of the forefoot consistent with cellulitis primarily along the dorsal aspect. IMPRESSION: 1. 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 2. Severe soft tissue swelling around the second metatarsal neck concerning for a phlegmon/developing abscess measuring 2.1 x 1 cm circumferentially around the distal shaft. 3. Mild early osteomyelitis of the third metatarsal head. 4. Severe soft tissue edema of the forefoot consistent with cellulitis primarily along the dorsal aspect. Electronically Signed   By: Kathreen Devoid M.D.   On: 11/10/2020 09:14   MR ANKLE LEFT WO CONTRAST  Result Date:  11/13/2020 CLINICAL DATA:  Suspected  osteomyelitis of the ankle. Recent diagnosis of osteomyelitis involving the second metatarsal and proximal phalanx. EXAM: MRI OF THE LEFT ANKLE WITHOUT CONTRAST TECHNIQUE: Multiplanar, multisequence MR imaging of the ankle was performed. No intravenous contrast was administered. COMPARISON:  Radiographs and MRI of 11/09/2020 FINDINGS: The patient had great difficulty remaining stationary for today's exam. Only a single diagnostic series could be obtained. Osseous structures: Interval resection of the second metatarsal on 11/11/2020, with expected regional postoperative findings. It is difficult on the single series provided to get a good sense of the base of the third metatarsal and its articulation with the lateral cuneiform, there is potentially some accentuated fluid signal and irregularity the articulation of the lateral cuneiform and the base of the third metatarsal. No discrete edema signal to suggest osteomyelitis involving the distal tibia/fibula, calcaneus, cuboid, talus, navicular, or medial cuneiform. The middle and lateral cuneiforms are somewhat irregular and difficult to fully characterize on this single series. Much of this irregularity is probably simply from arthropathy along the Lisfranc joint and postoperative findings. Ligamentous structures: Limited assessment due to the lack of images, no overt discontinuity along the lateral ligamentous complex or deltoid ligament. Musculotendinous: Extensor digitorum tenosynovitis. Common peroneus tendon sheath tenosynovitis noted with suspected longitudinal tearing of the peroneus brevis. Distal tibialis posterior tenosynovitis and tendinopathy. Achilles tendon intact. Plantar fascia poorly assessed. Soft tissues: Extensive subcutaneous edema circumferentially in the distal calf, mitigating somewhat in the ankle region. Dorsal subcutaneous edema in the foot. IMPRESSION: 1. Limited assessment as the patient could only  tolerate a single diagnostic series to be performed. This is a small subset of the normal MRI ankle exam. Reduced diagnostic sensitivity and specificity. 2. Interval resection of the second metatarsal. Possible arthropathy between the lateral cuneiform and the base of the third metatarsal. No definite osteomyelitis in the ankle region. 3. Longitudinal tear of the peroneus brevis with peroneus tenosynovitis. 4. Distal tibialis posterior tenosynovitis and tendinopathy. There is also extensor digitorum tenosynovitis. 5. Extensive subcutaneous edema circumferentially in the distal calf. Dorsal subcutaneous edema in the foot. Electronically Signed   By: Van Clines M.D.   On: 11/13/2020 07:33   US Carotid Bilateral  Result Date: 11/19/2020 CLINICAL DATA:  Stroke. EXAM: BILATERAL CAROTID DUPLEX ULTRASOUND TECHNIQUE: Pearline Cables scale imaging, color Doppler and duplex ultrasound were performed of bilateral carotid and vertebral arteries in the neck. COMPARISON:  None. FINDINGS: Examination is degraded secondary to patient's inability to tolerate appropriate positioning. Criteria: Quantification of carotid stenosis is based on velocity parameters that correlate the residual internal carotid diameter with NASCET-based stenosis levels, using the diameter of the distal internal carotid lumen as the denominator for stenosis measurement. The following velocity measurements were obtained: RIGHT ICA: 44/21 cm/sec CCA: 77/41 cm/sec SYSTOLIC ICA/CCA RATIO:  1.2 ECA: 44 cm/sec LEFT ICA: 75/21 cm/sec CCA: 28/78 cm/sec SYSTOLIC ICA/CCA RATIO:  1.8 ECA: 91 cm/sec RIGHT CAROTID ARTERY: There is a moderate to large amount of eccentric echogenic plaque within the right carotid bulb (image 18). There is a large amount of irregular mixed echogenic plaque involving the origin and proximal aspects of the right internal carotid artery (image 25), which results in a sonographic string sign (image 26) and markedly blunted monophasic waveform  within the proximal aspect of the right internal carotid artery (image 27). RIGHT VERTEBRAL ARTERY:  Antegrade Flow LEFT CAROTID ARTERY: There is a moderate amount of intimal thickening/atherosclerotic plaque scattered throughout the left common carotid artery. There is a moderate to large amount of eccentric partially shadowing plaque  within the left carotid bulb (image 52). There is a moderate amount of eccentric echogenic partially shadowing plaque involving the origin and proximal aspects of the left internal carotid artery (image 58), not definitely resulting in elevated peak systolic velocities in the left internal carotid artery, though note, velocity measurements may be unreliable in the setting of a suspected contralateral subtotal occlusion. LEFT VERTEBRAL ARTERY:  Antegrade flow IMPRESSION: 1. Large amount of right-sided atherosclerotic plaque results in a sonographic string sign and markedly blunted monophasic waveform involving the proximal aspect of the right internal carotid artery worrisome for a subtotal occlusion. Further evaluation with CTA could be performed as indicated. 2. Moderate to large amount of left-sided atherosclerotic plaque, not definitely resulting in a hemodynamically significant stenosis within the left internal carotid artery, though note, velocity measurements may be unreliable in the setting of a suspected contralateral subtotal occlusion. Again, this could be further evaluated with CTA as indicated. 3. Antegrade flow demonstrated within the bilateral vertebral arteries. These results will be called to the ordering clinician or representative by the Radiologist Assistant, and communication documented in the PACS or [employer]. Electronically Signed   By: Sandi Mariscal M.D.   On: 11/19/2020 08:36   PERIPHERAL VASCULAR CATHETERIZATION  Result Date: 11/22/2020 See surgical note for result.  US Venous Img Lower Unilateral Left  Result Date: 11/09/2020 CLINICAL DATA:   pain swelling EXAM: LEFT LOWER EXTREMITY VENOUS DOPPLER ULTRASOUND TECHNIQUE: Gray-scale sonography with graded compression, as well as color Doppler and duplex ultrasound were performed to evaluate the lower extremity deep venous systems from the level of the common femoral vein and including the common femoral, femoral, profunda femoral, popliteal and calf veins including the posterior tibial, peroneal and gastrocnemius veins when visible. The superficial great saphenous vein was also interrogated. Spectral Doppler was utilized to evaluate flow at rest and with distal augmentation maneuvers in the common femoral, femoral and popliteal veins. COMPARISON:  None. FINDINGS: Contralateral Common Femoral Vein: Respiratory phasicity is normal and symmetric with the symptomatic side. No evidence of thrombus. Normal compressibility. Common Femoral Vein: No evidence of thrombus. Normal compressibility, respiratory phasicity and response to augmentation. Saphenofemoral Junction: No evidence of thrombus. Normal compressibility and flow on color Doppler imaging. Profunda Femoral Vein: No evidence of thrombus. Normal compressibility and flow on color Doppler imaging. Femoral Vein: No evidence of thrombus. Normal compressibility, respiratory phasicity and response to augmentation. Popliteal Vein: No evidence of thrombus. Normal compressibility, respiratory phasicity and response to augmentation. Calf Veins: No evidence of thrombus. Normal compressibility and flow on color Doppler imaging. Other Findings:  Calf edema. IMPRESSION: No evidence of deep venous thrombosis.  Calf edema is present. Electronically Signed   By: Albin Felling M.D.   On: 11/09/2020 14:44   DG Chest Port 1 View  Result Date: 11/21/2020 CLINICAL DATA:  Acute hypoxic respiratory failure EXAM: PORTABLE CHEST 1 VIEW COMPARISON:  11/15/2020 FINDINGS: Cardiomegaly with mild interstitial edema. Mild right infrahilar opacity, favoring edema, although  superimposed right lower lobe pneumonia is not excluded. Possible small left pleural effusion. No pneumothorax. IMPRESSION: Cardiomegaly with mild interstitial edema and possible small left pleural effusion. Mild right infrahilar opacity, favoring edema, superimposed right lower lobe pneumonia not excluded. Electronically Signed   By: Julian Hy M.D.   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 EXAM: PORTABLE CHEST 1 VIEW COMPARISON:  11/14/2020 and older studies. FINDINGS: Since the prior exam, the endotracheal tube and the nasal/orogastric tube have been removed. Cardiac silhouette is normal in size. No mediastinal or hilar masses. Vascular congestion has improved. Lung base opacities have cleared. Lungs are now clear. No pneumothorax.  Possible small effusions. Skeletal structures are grossly intact. IMPRESSION: 1. Significant improvement. There is residual vascular congestion, but no persistent interstitial or airspace edema. 2. Status post removal of the endotracheal tube and nasal/orogastric tube. Electronically Signed   By: Lajean Manes M.D.   On: 11/15/2020 15:58   DG Chest Port 1 View  Result Date: 11/14/2020 CLINICAL DATA:  Acute respiratory failure EXAM: PORTABLE CHEST 1 VIEW COMPARISON:  Previous day FINDINGS: Endotracheal tube terminates approximately 1 cm above the carina. Esophageal temperature probe is present. There is enteric tube which terminates below the level of the diaphragm with the distal tip not visualized. The heart is enlarged. Stable appearance of widened vascular pedicle. Left the perihilar and interstitial opacities are present bilaterally. There is increased consolidative opacity in the right lower lung. Small right pleural effusion has increased in size. No acute osseous  abnormality. IMPRESSION: 1. Support lines and tubes as described. The endotracheal tube appears to terminate approximately 1 cm above the carina. 2. Cardiomegaly with perihilar and interstitial opacities compatible with pulmonary edema. Small right pleural effusion has increased in size. 3. Evolving opacity in the right lung base is more conspicuous, and raises question for superimposed infection. Attention on follow-up films. Electronically Signed   By: Albin Felling M.D.   On: 11/14/2020 08:03   DG Chest Port 1 View  Result Date: 11/13/2020 CLINICAL DATA:  Intubation EXAM: PORTABLE CHEST 1 VIEW COMPARISON:  Portable exam 1046 hours compared to 0907 hours FINDINGS: Tip of endotracheal tube is at carina directed toward RIGHT mainstem bronchus, recommend withdrawal 2-3 cm. Enlargement of cardiac silhouette with vascular congestion. RIGHT lung infiltrate slightly increased from previous exam. Mild RIGHT basilar atelectasis. No pleural effusion or pneumothorax. IMPRESSION: Tip of endotracheal tube is at carina, recommend withdrawal 2-3 cm. Enlargement of cardiac silhouette with pulmonary vascular congestion and increased RIGHT lung infiltrate. Findings called to patient's nurse Sarah RN in CCU on 11/13/2020 at 1110 hours. Electronically Signed   By: Lavonia Dana M.D.   On: 11/13/2020 11:21   DG Chest Port 1 View  Result Date: 11/13/2020 CLINICAL DATA:  Respiratory difficulty EXAM: PORTABLE CHEST 1 VIEW COMPARISON:  11/12/2020 FINDINGS: Heart size within normal limits. No pulmonary vascular congestion. Airspace opacity in the right lower lung likely due to pneumonitis. Lungs otherwise clear. No pneumothorax. IMPRESSION: Right basilar airspace opacity likely due to pneumonitis. Electronically Signed   By: Miachel Roux M.D.   On: 11/13/2020 09:15   DG Chest Port 1 View  Result Date: 11/12/2020 CLINICAL DATA:  Wheezing EXAM: PORTABLE CHEST 1 VIEW COMPARISON:  Chest x-ray 11/09/2020 FINDINGS: The heart and  mediastinal contours are within normal limits. Patchy right lung airspace and interstitial opacity. No pulmonary edema. No pleural effusion. No pneumothorax. No acute osseous abnormality. IMPRESSION: Patchy right lung airspace and interstitial airspace opacity that may represent infection/inflammation. Electronically Signed   By: Iven Finn M.D.   On: 11/12/2020 15:05   DG Chest Portable 1 View  Result Date: 11/09/2020 CLINICAL DATA:  Altered mental status and fall EXAM: PORTABLE CHEST 1 VIEW COMPARISON:  07/26/2007 and other studies FINDINGS: The cardiomediastinal silhouette is unremarkable. There is no evidence of  focal airspace disease, pulmonary edema, suspicious pulmonary nodule/mass, pleural effusion, or pneumothorax. No acute bony abnormalities are identified. IMPRESSION: No active disease. Electronically Signed   By: Margarette Canada M.D.   On: 11/09/2020 13:06   DG Abd Portable 1V  Result Date: 11/17/2020 CLINICAL DATA:  Check gastric catheter placement EXAM: PORTABLE ABDOMEN - 1 VIEW COMPARISON:  11/15/2020 FINDINGS: Weighted feeding catheter is again noted in the distal stomach directed towards the pool or so. No free air is seen. IMPRESSION: Weighted feeding catheter in the distal stomach. Electronically Signed   By: Inez Catalina M.D.   On: 11/17/2020 19:21   DG Abd Portable 1V  Result Date: 11/13/2020 CLINICAL DATA:  Enteric tube placement EXAM: PORTABLE ABDOMEN - 1 VIEW COMPARISON:  None. FINDINGS: Tip of enteric tube is seen in the region of body of stomach. Bowel gas pattern is nonspecific. Arterial calcifications are seen. Right margin of abdominal aorta is projecting to the right of lumbar spine. This may be due to tortuosity or aneurysmal dilation of abdominal aorta. Degenerative changes are noted with disc space narrowing, bony spurs and facet hypertrophy in the lumbar spine. IMPRESSION: Tip and side port of enteric tube are noted within the stomach. Nonspecific bowel gas pattern.  Calcification in the abdominal aorta is projecting slightly to the right of lumbar spine which may be due to tortuosity or aneurysmal dilation. Electronically Signed   By: Elmer Picker M.D.   On: 11/13/2020 11:03   DG Foot Complete Left  Result Date: 11/09/2020 CLINICAL DATA:  Pain and swelling.  Ulcer at the bottom of the foot. EXAM: LEFT FOOT - COMPLETE 3+ VIEW COMPARISON:  None. FINDINGS: Soft tissue wound along the plantar aspect of the forefoot. Cortical irregularity of the third metatarsal head concerning for osteomyelitis. Irregularity of the second metatarsal head concerning for osteomyelitis. Mild periosteal reaction involving the second and third metatarsal shafts. Dorsal subluxation of the second and third proximal phalanx. Soft tissue emphysema in the second toe. Severe soft tissue swelling involving the foot and ankle. IMPRESSION: Cortical irregularity of the third metatarsal head concerning for osteomyelitis. Irregularity of the second metatarsal head concerning for osteomyelitis. Mild periosteal reaction involving the second and third metatarsal shafts. Soft tissue swelling involving the foot and ankle which may be reactive versus secondary to cellulitis. Electronically Signed   By: Kathreen Devoid M.D.   On: 11/09/2020 13:45   EEG adult  Result Date: 11/13/2020 Derek Jack, MD     11/13/2020  6:59 PM Routine EEG Report KARSTEN VAUGHN is a 70 y.o. female with a history of encephalopathy who is undergoing an EEG to evaluate for seizures. Report: This EEG was acquired with electrodes placed according to the International 10-20 electrode system (including Fp1, Fp2, F3, F4, C3, C4, P3, P4, O1, O2, T3, T4, T5, T6, A1, A2, Fz, Cz, Pz). The following electrodes were missing or displaced: none. The best background was 5-6 Hz. This activity was near-continuous although occasionally 1-2 seconds of intervening diffuse suppression is noted. This activity is reactive to stimulation. Drowsiness  was manifested by background fragmentation; deeper stages of sleep were identified by K complexes and sleep spindles. There was no focal slowing. There were no interictal epileptiform discharges. There were no electrographic seizures identified. Photic stimulation and hyperventilation were not performed. Impression and clinical correlation: This EEG was obtained while sedated and asleep and is abnormal due to moderate diffuse slowing and occasional brief periods of intervening suppression both indicative of cerebral dysfunction, medication  effect, or both. Su Monks, MD [facility] [phone] If 7pm- 7am, please page neurology on call as listed in [place].   ECHOCARDIOGRAM COMPLETE  Result Date: 11/10/2020    ECHOCARDIOGRAM REPORT   Patient Name:   SHUNNA MIKAELIAN Date of Exam: 11/10/2020 Medical Rec #:  [id number]          Height:       66.0 in Accession #:    [id number]         Weight:       190.0 lb Date of Birth:  1950-04-01          BSA:          1.957 m Patient Age:    70 years           BP:           141/94 mmHg Patient Gender: F                  HR:           100 bpm. Exam Location:  [facility] Procedure: 2D Echo and Intracardiac Opacification Agent Indications:     Atrial Fibrillation  History:         Patient has no prior history of Echocardiogram examinations.                  Risk Factors:Hypertension, Former Smoker and Diabetes.  Sonographer:     L Thornton-Maynard Referring Phys:  [id number] AGBATA Diagnosing Phys: Skeet Latch MD  Sonographer Comments: Suboptimal parasternal window. IMPRESSIONS  1. Left ventricular ejection fraction, by estimation, is 55 to 60%. The left ventricle has normal function. The left ventricle has no regional wall motion abnormalities. There is mild concentric left ventricular hypertrophy. Left ventricular diastolic parameters are indeterminate. Elevated left ventricular end-diastolic pressure.  2. Right ventricular systolic function is normal. The  right ventricular size is normal. There is normal pulmonary artery systolic pressure.  3. Left atrial size was severely dilated.  4. The mitral valve is normal in structure. Trivial mitral valve regurgitation. No evidence of mitral stenosis.  5. The aortic valve is tricuspid. Aortic valve regurgitation is not visualized. No aortic stenosis is present.  6. The inferior vena cava is normal in size with greater than 50% respiratory variability, suggesting right atrial pressure of 3 mmHg.  7. There is a small patent foramen ovale. FINDINGS  Left Ventricle: Left ventricular ejection fraction, by estimation, is 55 to 60%. The left ventricle has normal function. The left ventricle has no regional wall motion abnormalities. Definity contrast agent was given IV to delineate the left ventricular  endocardial borders. The left ventricular internal cavity size was normal in size. There is mild concentric left ventricular hypertrophy. Left ventricular diastolic parameters are indeterminate. Elevated left ventricular end-diastolic pressure. Right Ventricle: The right ventricular size is normal. No increase in right ventricular wall thickness. Right ventricular systolic function is normal. There is normal pulmonary artery systolic pressure. The tricuspid regurgitant velocity is 2.55 m/s, and  with an assumed right atrial pressure of 3 mmHg, the estimated right ventricular systolic pressure is 71.6 mmHg. Left Atrium: Left atrial size was severely dilated. Right Atrium: Right atrial size was normal in size. Pericardium: There is no evidence of pericardial effusion. Mitral Valve: The mitral valve is normal in structure. Mild mitral annular calcification. Trivial mitral valve regurgitation. No evidence of mitral valve stenosis. MV peak gradient, 6.4 mmHg. The mean mitral valve gradient is 3.0 mmHg. Tricuspid Valve: The  tricuspid valve is normal in structure. Tricuspid valve regurgitation is trivial. No evidence of tricuspid stenosis.  Aortic Valve: The aortic valve is tricuspid. Aortic valve regurgitation is not visualized. No aortic stenosis is present. Aortic valve mean gradient measures 5.0 mmHg. Aortic valve peak gradient measures 8.1 mmHg. Aortic valve area, by VTI measures 1.42 cm. Pulmonic Valve: The pulmonic valve was normal in structure. Pulmonic valve regurgitation is not visualized. No evidence of pulmonic stenosis. Aorta: The aortic root is normal in size and structure. Venous: The inferior vena cava is normal in size with greater than 50% respiratory variability, suggesting right atrial pressure of 3 mmHg. IAS/Shunts: No atrial level shunt detected by color flow Doppler. A small patent foramen ovale is detected.  LEFT VENTRICLE PLAX 2D LVIDd:         4.00 cm      Diastology LVIDs:         2.80 cm      LV e' medial:    5.19 cm/s LV PW:         1.10 cm      LV E/e' medial:  22.2 LV IVS:        1.40 cm      LV e' lateral:   7.62 cm/s LVOT diam:     1.80 cm      LV E/e' lateral: 15.1 LV SV:         34 LV SV Index:   17 LVOT Area:     2.54 cm  LV Volumes (MOD) LV vol d, MOD A2C: 102.0 ml LV vol d, MOD A4C: 107.0 ml LV vol s, MOD A2C: 27.7 ml LV vol s, MOD A4C: 36.5 ml LV SV MOD A2C:     74.3 ml LV SV MOD A4C:     107.0 ml LV SV MOD BP:      70.1 ml RIGHT VENTRICLE RV S prime:     11.70 cm/s TAPSE (M-mode): 2.0 cm LEFT ATRIUM              Index LA diam:        3.70 cm  1.89 cm/m LA Vol (A2C):   137.0 ml 70.02 ml/m LA Vol (A4C):   128.0 ml 65.42 ml/m LA Biplane Vol: 134.0 ml 68.48 ml/m  AORTIC VALVE                     PULMONIC VALVE AV Area (Vmax):    1.37 cm      PV Vmax:       1.04 m/s AV Area (Vmean):   1.56 cm      PV Peak grad:  4.3 mmHg AV Area (VTI):     1.42 cm AV Vmax:           142.00 cm/s AV Vmean:          101.000 cm/s AV VTI:            0.239 m AV Peak Grad:      8.1 mmHg AV Mean Grad:      5.0 mmHg LVOT Vmax:         76.40 cm/s LVOT Vmean:        62.000 cm/s LVOT VTI:          0.133 m LVOT/AV VTI ratio: 0.56  AORTA  Ao Root diam: 3.20 cm MITRAL VALVE                TRICUSPID VALVE MV Area (PHT): 2.99  cm     TR Peak grad:   26.0 mmHg MV Area VTI:   1.66 cm     TR Vmax:        255.00 cm/s MV Peak grad:  6.4 mmHg MV Mean grad:  3.0 mmHg     SHUNTS MV Vmax:       1.26 m/s     Systemic VTI:  0.13 m MV Vmean:      77.4 cm/s    Systemic Diam: 1.80 cm MV Decel Time: 254 msec MV E velocity: 115.33 cm/s Skeet Latch MD Electronically signed by Skeet Latch MD Signature Date/Time: 11/10/2020/10:52:52 AM    Final     Assessment and plan- Patient is a 70 y.o. female with multiple comorbidities who has been hospitalized for a month for left foot diabetic ulcer and frequent falls.  Hematology consulted for worsening neutropenia.  Patient has been hospitalized for about a month now and came in with a white cell count of 15.  Her white cell count has been gradually drifting down since 11/27/2020 and today it is 1 with an Ballou of 0.  Given that this has all been an acute process I do not suspect a primary bone marrow etiology for her neutropenia..  Hemoglobin is overall stable between 8-9.  Platelets which dropped down to 64 are now gradually improving.  It is possible that her white cell count will remain low for a few days and then gradually start improving in its own.  Antibiotic such as fluconazole can also contribute to neutropenia/agranulocytosis and I would recommend that it should be reconsidered if fluconazole needs to be continued.  Given that her Mayer is 0 I will start her on Neupogen to see if we can slowly get her [place] to greater than 2.  I will hold off on bone marrow biopsy at this time.  I have ordered anemia work-up for tomorrow.    Visit Diagnosis 1. Osteomyelitis of foot, left, acute ([place])   2. Atrial fibrillation with RVR ([place])   3. Syncope, unspecified syncope type   4. Worsening renal function   5. AKI (acute kidney injury) ([place])   6. Atrial fibrillation with rapid ventricular response (HCC)   7.  Wheeze   8. Respiratory difficulty   9. Encounter for intubation   10. Encounter for imaging study to confirm orogastric (OG) tube placement   11. Acute respiratory failure with hypoxia (HCC)   12. Hypoxia   13. Encounter for nasogastric (NG) tube placement   14. Encounter for imaging study to confirm nasogastric (NG) tube placement   15. Stroke ([place])   16. Acute respiratory distress   17. Acute hypoxemic respiratory failure (HCC)   18. Sepsis ([place])   19. Other neutropenia ([place])     Dr. Randa Evens, MD, MPH [facility] at [facility] [phone] 12/08/2020

## 2020-12-08 NOTE — Progress Notes (Signed)
Patient with c/o nausea during bedside shift report. IV Zofran admin. HR mid 120's -130's at 0815. Afib on monitor. Patient states she doesn't feel well. MD paged. EKG shows afib with RVR. Cardizem 5mg  admin at [phone]. As of this time still not effective in treating HR. Awaiting orders at this time.

## 2020-12-08 NOTE — Consult Note (Signed)
Pharmacy Antibiotic Note  Autumn [name] is a 70 y.o. female admitted on 11/09/2020 with  osteomyelitis .  Followed by ID. Started on IV Zosyn initially and narrowed to Unasyn based on culture result. Initial plan was IV therapy x 4 weeks (Unasyn) then 2 weeks PO (Augmentin) until 12/23/20.  11/27: patient with worsening neutropenia. MD to escalate therapy to Vancomycin, Cipro, metronidazole Has Pharmacy has been consulted for Vancomycin dosing.  Plan: Vancomycin 2000 mg LD x 1 follwed by Initiate Vancomycin 750 mg Q48H. Goal AUC 400-550 Estimated AUC 470/Cmin: 13.1 Scr 3.5, IBW, Vd 0.5    Height: 5\' 6"  (167.6 cm) Weight: 97.4 kg (214 lb 11.7 oz) IBW/kg (Calculated) : 59.3  Temp (24hrs), Avg:98.4 F (36.9 C), Min:98.1 F (36.7 C), Max:98.7 F (37.1 C)  Recent Labs  Lab 12/02/20 0548 12/03/20 0401 12/04/20 0416 12/07/20 0610 12/08/20 0956  WBC 4.0 3.6* 3.9* 1.5* 1.0*  CREATININE 3.53* 3.54* 3.57*  --  3.50*    Estimated Creatinine Clearance: 18.1 mL/min (A) (by C-G formula based on SCr of 3.5 mg/dL (H)).    Allergies  Allergen Reactions   Codeine Nausea And Vomiting    Antimicrobials this admission: ongoing 11/27 Vancomycin >> 11/27 Cipro >> 11/27 Metronidazole >> Completed 10/29 Vancomycin >> 10/31 10/29 Cefepime >> 11/01 10/29 Metronidazole >> 11/01 11/02 Zosyn >> 11/3 11/3 Unasyn> 11/27  Dose adjustments this admission: N/a  Microbiology results: 10/30 MRSA PCR: (-) 10/31 Wound (2nd metatarsal): strep mitis/oralis, pasteurella, MSSA, bacteroides 10/31 Wound (metatarsal): bacteroides, peptostreptococcus micros 10/31 Abscess (left foot): MSSA, pasteurella 11/2 Bcx Ng5d (final)  Thank you for allowing pharmacy to be a part of this patient's care.  Dorothe Pea, PharmD, BCPS Clinical Pharmacist   12/08/2020 12:36 PM

## 2020-12-09 DIAGNOSIS — A419 Sepsis, unspecified organism: Secondary | ICD-10-CM | POA: Diagnosis not present

## 2020-12-09 DIAGNOSIS — M86172 Other acute osteomyelitis, left ankle and foot: Secondary | ICD-10-CM | POA: Diagnosis not present

## 2020-12-09 DIAGNOSIS — R6520 Severe sepsis without septic shock: Secondary | ICD-10-CM | POA: Diagnosis not present

## 2020-12-09 DIAGNOSIS — I4891 Unspecified atrial fibrillation: Secondary | ICD-10-CM | POA: Diagnosis not present

## 2020-12-09 LAB — CBC WITH DIFFERENTIAL/PLATELET
Abs Immature Granulocytes: 0 10*3/uL (ref 0.00–0.07)
Basophils Absolute: 0 10*3/uL (ref 0.0–0.1)
Basophils Relative: 2 %
Eosinophils Absolute: 0 10*3/uL (ref 0.0–0.5)
Eosinophils Relative: 2 %
HCT: 26.7 % — ABNORMAL LOW (ref 36.0–46.0)
Hemoglobin: 8.4 g/dL — ABNORMAL LOW (ref 12.0–15.0)
Immature Granulocytes: 0 %
Lymphocytes Relative: 31 %
Lymphs Abs: 0.5 10*3/uL — ABNORMAL LOW (ref 0.7–4.0)
MCH: 29.1 pg (ref 26.0–34.0)
MCHC: 31.5 g/dL (ref 30.0–36.0)
MCV: 92.4 fL (ref 80.0–100.0)
Monocytes Absolute: 1 10*3/uL (ref 0.1–1.0)
Monocytes Relative: 61 %
Neutro Abs: 0.1 10*3/uL — CL (ref 1.7–7.7)
Neutrophils Relative %: 4 %
Platelets: 130 10*3/uL — ABNORMAL LOW (ref 150–400)
RBC: 2.89 MIL/uL — ABNORMAL LOW (ref 3.87–5.11)
RDW: 17.4 % — ABNORMAL HIGH (ref 11.5–15.5)
Smear Review: NORMAL
WBC: 1.7 10*3/uL — ABNORMAL LOW (ref 4.0–10.5)
nRBC: 0 % (ref 0.0–0.2)

## 2020-12-09 LAB — IRON AND TIBC
Iron: 46 ug/dL (ref 28–170)
Saturation Ratios: 17 % (ref 10.4–31.8)
TIBC: 267 ug/dL (ref 250–450)
UIBC: 221 ug/dL

## 2020-12-09 LAB — BASIC METABOLIC PANEL
Anion gap: 9 (ref 5–15)
BUN: 53 mg/dL — ABNORMAL HIGH (ref 8–23)
CO2: 24 mmol/L (ref 22–32)
Calcium: 8.6 mg/dL — ABNORMAL LOW (ref 8.9–10.3)
Chloride: 104 mmol/L (ref 98–111)
Creatinine, Ser: 3.57 mg/dL — ABNORMAL HIGH (ref 0.44–1.00)
GFR, Estimated: 13 mL/min — ABNORMAL LOW (ref >60)
Glucose, Bld: 104 mg/dL — ABNORMAL HIGH (ref 70–99)
Potassium: 4.6 mmol/L (ref 3.5–5.1)
Sodium: 137 mmol/L (ref 135–145)

## 2020-12-09 LAB — RETICULOCYTES
Immature Retic Fract: 21.3 % — ABNORMAL HIGH (ref 2.3–15.9)
RBC.: 2.89 MIL/uL — ABNORMAL LOW (ref 3.87–5.11)
Retic Count, Absolute: 118.5 10*3/uL (ref 19.0–186.0)
Retic Ct Pct: 4.1 % — ABNORMAL HIGH (ref 0.4–3.1)

## 2020-12-09 LAB — FOLATE: Folate: 14 ng/mL (ref >5.9)

## 2020-12-09 LAB — VITAMIN B12: Vitamin B-12: 633 pg/mL (ref 180–914)

## 2020-12-09 LAB — PATHOLOGIST SMEAR REVIEW

## 2020-12-09 LAB — URIC ACID: Uric Acid, Serum: 6.8 mg/dL (ref 2.5–7.1)

## 2020-12-09 LAB — FERRITIN: Ferritin: 37 ng/mL (ref 11–307)

## 2020-12-09 MED ORDER — DILTIAZEM HCL ER COATED BEADS 180 MG PO CP24
180.0000 mg | ORAL_CAPSULE | Freq: Every day | ORAL | Status: DC
  Administered 2020-12-09 – 2020-12-21 (×13): 180 mg via ORAL
  Filled 2020-12-09 (×13): qty 1

## 2020-12-09 MED ORDER — SODIUM CHLORIDE 0.9 % IV SOLN
100.0000 mg | Freq: Two times a day (BID) | INTRAVENOUS | Status: DC
  Administered 2020-12-10 – 2020-12-13 (×8): 100 mg via INTRAVENOUS
  Filled 2020-12-09 (×10): qty 100

## 2020-12-09 MED ORDER — SODIUM CHLORIDE 0.9 % IV SOLN
12.5000 mg | Freq: Once | INTRAVENOUS | Status: AC
  Administered 2020-12-09: 16:00:00 12.5 mg via INTRAVENOUS
  Filled 2020-12-09: qty 0.5

## 2020-12-09 NOTE — Care Management Important Message (Signed)
Important Message  Patient Details  Name: Autumn [name] MRN: [id number] Date of Birth: 02-09-1950   Medicare Important Message Given:  Yes     Dannette Barbara 12/09/2020, 12:41 PM

## 2020-12-09 NOTE — Progress Notes (Signed)
Progress Note    Autumn [name]  QDI:[id number] DOB: [birth year]  DOA: 11/09/2020 PCP: Harlow Ohms, MD      Brief Narrative:    Medical records reviewed and are as summarized below:  Autumn [name] is a 70 y.o. 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  She'd been following with a [facility] podiatrist outpatient and had recently refused surgery.  She'd recently been treated with antibiotics at [facility] for the diabetic foot infection with enterobacter aerogans culture positive.  She was admitted on 10/29 due to falls at home with worsening L foot chronic diabetic ulcer with osteomyelitis of the 2nd and 3rd metatarsals and cellulitis noted on MRI.  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  Her post operative course was complicated by unresponsiveness and neurologic changes. She was found to have a stroke and was intubated for airway protection.  She was extubated 11/3 and transferred to [facility] service on 11/4.  Hospital course further complicated by acute on chronic anemia with melena after starting on anticoagulation, requiring blood transfusions and GI evaluation.       Assessment/Plan:   Principal Problem:   Sepsis ([place]) Active Problems:   Atrial fibrillation with RVR (HCC)   Osteomyelitis of foot, left, acute ([place])   CKD stage 4 due to type 2 diabetes mellitus ([place])   Liver cirrhosis secondary to NASH (nonalcoholic steatohepatitis) (HCC)   Hypertension   History of anemia due to CKD   Frequent falls   AKI (acute kidney injury) ([place])   Cellulitis and abscess of foot, except toes   Infectious tenosynovitis   Wheeze   Atrial fibrillation with rapid ventricular response (HCC)   Bilateral carotid artery stenosis   Neutropenia (HCC)   Drug-induced neutropenia (HCC)   Normocytic anemia   Thrombocytopenia  (HCC)   Nutrition Problem: Increased nutrient needs Etiology: wound healing  Signs/Symptoms: estimated needs   Body mass index is 34.12 kg/m.  (Obesity)  Septic shock from left diabetic foot infection, left foot osteomyelitis and cellulitis, septic arthritis of second MTP joint: S/p amputation ray second left metatarsal, I&D deep abscess multiple fascial planes of the left foot on 11/12/2020.  Of note, surgical wound culture showed strep mitis/oralis, staff aureus, abundant Bacteroides species and rare Pasteurella multocida. Continue IV antibiotics.  Follow-up with ID specialist for further recommendations  Severe leukopenia/neutropenia: She has been started on Neupogen.  Monitor CBC.  Atrial fibrillation with RVR, chest discomfort/pressure, hypertension:  Continue metoprolol and Eliquis.  Change short acting Cardizem to long-acting Cardizem.  Monitor on telemetry.  Troponin x2 was 21 which is insignificant and rules out acute MI.  Acute stroke with expressive aphasia and dysphagia: Continue Lipitor and Eliquis.  Continue dysphagia 3 diet.  Acute GI bleeding/melena on 11/26/2020: EGD on 11/28/2018 showed just an esophageal mucosa, gastritis, erythematous duodenopathy.  Colonoscopy 11/29/2020 showed multiple polyps that were removed but the colon prep was poor.  Continue Protonix.  Esophageal candidiasis: Fluconazole was discontinued on 12/09/2020 because of neutropenia.  Right hand/thumb pain and stiffness: Probably from arthritis/gout.  Continue allopurinol.  Analgesics as needed.  Uric acid was 6.8.  Acute hypoxic respiratory failure, probable OSA: Continue BiPAP at night  Acute on chronic diastolic CHF: 2D echo showed normal EF, mild LVH, severely dilated left atrium, indeterminate LV diastolic parameters.  Aspiration pneumonia: Completed treatment  Acute metabolic encephalopathy, thrombocytopenia: Improved  Other comorbidities include CKD stage IV, NASH liver cirrhosis,  hypothyroidism, type II DM     Diet Order             Diet regular Room service appropriate? Yes; Fluid consistency: Thin  Diet effective now                      Consultants: Gastroenterologist Intensivist Neurologist Infectious disease specialist Hematologist  Procedures: Intubation on 11/13/2020 EGD and colonoscopy  Arch aortogram, selective cannulation of the left common carotid artery Amputation ray second metatarsal and, I&D deep abscess multiple fascial planes, bone biopsy third metatarsal    Medications:    (feeding supplement) PROSource Plus  30 mL Oral TID BM   allopurinol  50 mg Oral QODAY   apixaban  5 mg Oral BID   vitamin C  500 mg Oral BID   atorvastatin  40 mg Oral Daily   budesonide (PULMICORT) nebulizer solution  0.5 mg Nebulization BID   bumetanide  4 mg Oral Daily   diltiazem  180 mg Oral Daily   feeding supplement  1 Container Oral TID BM   ferrous gluconate  324 mg Oral Q breakfast   hydrALAZINE  100 mg Oral Q8H   levothyroxine  100 mcg Oral Q0600   mouth rinse  15 mL Mouth Rinse BID   metoprolol tartrate  50 mg Oral BID   multivitamin with minerals  1 tablet Oral Daily   pantoprazole  40 mg Oral BID   sodium chloride flush  3 mL Intravenous Q12H   sodium chloride flush  3 mL Intravenous Q12H   spironolactone  25 mg Oral Daily   Tbo-Filgrastim  480 mcg Subcutaneous Daily   venlafaxine  50 mg Oral BID   Continuous Infusions:  sodium chloride 10 mL/hr at 12/05/20 0604   sodium chloride Stopped (12/06/20 1004)   sodium chloride 250 mL (11/25/20 1208)   ciprofloxacin 400 mg (12/09/20 1404)   metronidazole 500 mg (12/09/20 1523)   [START ON 12/10/2020] vancomycin       Anti-infectives (From admission, onward)    Start     Dose/Rate Route Frequency Ordered Stop   12/10/20 1200  vancomycin (VANCOREADY) IVPB 750 mg/150 mL        750 mg 150 mL/hr over 60 Minutes Intravenous Every 48 hours 12/08/20 1236     12/08/20 1400   metroNIDAZOLE (FLAGYL) IVPB 500 mg        500 mg 100 mL/hr over 60 Minutes Intravenous Every 12 hours 12/08/20 1231     12/08/20 1400  ciprofloxacin (CIPRO) IVPB 400 mg        400 mg 200 mL/hr over 60 Minutes Intravenous Every 24 hours 12/08/20 1231     12/08/20 1315  vancomycin (VANCOREADY) IVPB 2000 mg/400 mL        2,000 mg 200 mL/hr over 120 Minutes Intravenous  Once 12/08/20 1218 12/08/20 1506   12/05/20 1130  fluconazole (DIFLUCAN) tablet 200 mg  Status:  Discontinued        200 mg Oral Daily 12/05/20 1046 12/09/20 0725   11/30/20 1400  fluconazole (DIFLUCAN) IVPB 200 mg  Status:  Discontinued        200 mg 100 mL/hr over 60 Minutes Intravenous Every 24 hours 11/29/20 1633 12/05/20 1044   11/29/20 1800  fluconazole (DIFLUCAN) IVPB 400 mg        400 mg 100 mL/hr over  120 Minutes Intravenous  Once 11/29/20 1633 11/30/20 0724   11/29/20 1700  fluconazole (DIFLUCAN) IVPB 200 mg  Status:  Discontinued        200 mg 100 mL/hr over 60 Minutes Intravenous Every 24 hours 11/29/20 1611 11/29/20 1633   11/23/20 0000  ceFAZolin (ANCEF) IVPB 1 g/50 mL premix       Note to Pharmacy: To be given in specials   1 g 100 mL/hr over 30 Minutes Intravenous  Once 11/22/20 1106 11/22/20 1750   11/14/20 2200  Ampicillin-Sulbactam (UNASYN) 3 g in sodium chloride 0.9 % 100 mL IVPB  Status:  Discontinued        3 g 200 mL/hr over 30 Minutes Intravenous Every 12 hours 11/14/20 1610 12/08/20 1143   11/14/20 0900  piperacillin-tazobactam (ZOSYN) IVPB 2.25 g  Status:  Discontinued        2.25 g 100 mL/hr over 30 Minutes Intravenous Every 8 hours 11/14/20 0805 11/14/20 1609   11/13/20 2200  piperacillin-tazobactam (ZOSYN) IVPB 3.375 g  Status:  Discontinued        3.375 g 12.5 mL/hr over 240 Minutes Intravenous Every 12 hours 11/13/20 0758 11/14/20 0805   11/13/20 0600  piperacillin-tazobactam (ZOSYN) IVPB 2.25 g        2.25 g 100 mL/hr over 30 Minutes Intravenous Every 8 hours 11/12/20 2155 11/13/20 1725    11/11/20 1600  vancomycin (VANCOCIN) IVPB 1000 mg/200 mL premix  Status:  Discontinued        1,000 mg 200 mL/hr over 60 Minutes Intravenous Every 48 hours 11/09/20 1618 11/10/20 1045   11/11/20 1558  vancomycin (VANCOCIN) powder  Status:  Discontinued          As needed 11/11/20 1558 11/11/20 1558   11/10/20 2000  vancomycin (VANCOCIN) IVPB 1000 mg/200 mL premix  Status:  Discontinued        1,000 mg 200 mL/hr over 60 Minutes Intravenous Every 48 hours 11/10/20 1918 11/12/20 0940   11/09/20 1800  ceFEPIme (MAXIPIME) 2 g in sodium chloride 0.9 % 100 mL IVPB  Status:  Discontinued        2 g 200 mL/hr over 30 Minutes Intravenous Every 24 hours 11/09/20 1619 11/12/20 2146   11/09/20 1619  vancomycin variable dose per unstable renal function (pharmacist dosing)  Status:  Discontinued         Does not apply See admin instructions 11/09/20 1619 11/12/20 1521   11/09/20 1615  vancomycin (VANCOREADY) IVPB 1750 mg/350 mL        1,750 mg 175 mL/hr over 120 Minutes Intravenous  Once 11/09/20 1606 11/09/20 1947   11/09/20 1600  metroNIDAZOLE (FLAGYL) IVPB 500 mg  Status:  Discontinued        500 mg 100 mL/hr over 60 Minutes Intravenous Every 8 hours 11/09/20 1555 11/12/20 2146              Family Communication/Anticipated D/C date and plan/Code Status   DVT prophylaxis: Place and maintain sequential compression device Start: 11/15/20 1527 apixaban (ELIQUIS) tablet 5 mg     Code Status: Full Code  Family Communication: None Disposition Plan: Plan to discharge to SNF when medically stable   Status is: Inpatient  Remains inpatient appropriate because: Severe neutropenia           Subjective:   C/o nausea.  No chest pain, shortness of breath or palpitations.  She also complains of pain and stiffness in the left hand/thumb.  No history of trauma.  She  feels a little better compared to yesterday.  Objective:    Vitals:   12/09/20 0736 12/09/20 0829 12/09/20 1214  12/09/20 1558  BP:  134/83 138/81 138/86  Pulse:  97 98 98  Resp:  18 18 18   Temp:  97.8 F (36.6 C) 97.9 F (36.6 C) 97.8 F (36.6 C)  TempSrc:   Oral Oral  SpO2: 97% 96% 96% 98%  Weight:      Height:       No data found.   Intake/Output Summary (Last 24 hours) at 12/09/2020 1633 Last data filed at 12/09/2020 0530 Gross per 24 hour  Intake 3 ml  Output 1150 ml  Net -1147 ml   Filed Weights   12/07/20 0405 12/08/20 0500 12/09/20 0500  Weight: 97.1 kg 97.4 kg 95.9 kg    Exam:  GEN: NAD SKIN: Warm and dry EYES: EOMI ENT: MMM CV: Regular rate and rhythm, tachycardic PULM: CTA B ABD: soft, obese, NT, +BS CNS: AAO x 3, non focal EXT: Left foot surgical wound connected to wound VAC      Data Reviewed:   I have personally reviewed following labs and imaging studies:  Labs: Labs show the following:   Basic Metabolic Panel: Recent Labs  Lab 12/03/20 0401 12/04/20 0416 12/08/20 0956 12/09/20 0617  NA 139 138 139 137  K 3.5 3.6 3.6 4.6  CL 107 107 106 104  CO2 25 24 23 24   GLUCOSE 105* 122* 112* 104*  BUN 50* 53* 56* 53*  CREATININE 3.54* 3.57* 3.50* 3.57*  CALCIUM 8.3* 8.2* 8.5* 8.6*  MG  --   --  2.1  --    GFR Estimated Creatinine Clearance: 17.6 mL/min (A) (by C-G formula based on SCr of 3.57 mg/dL (H)). Liver Function Tests: No results for input(s): AST, ALT, ALKPHOS, BILITOT, PROT, ALBUMIN in the last 168 hours. No results for input(s): LIPASE, AMYLASE in the last 168 hours. No results for input(s): AMMONIA in the last 168 hours. Coagulation profile No results for input(s): INR, PROTIME in the last 168 hours.  CBC: Recent Labs  Lab 12/03/20 0401 12/04/20 0416 12/07/20 0610 12/08/20 0956 12/09/20 0617  WBC 3.6* 3.9* 1.5* 1.0* 1.7*  NEUTROABS  --   --   --  0.0* 0.1*  HGB 7.7* 8.5* 7.9* 8.2* 8.4*  HCT 23.7* 26.4* 24.5* 25.4* 26.7*  MCV 94.0 92.6 93.2 91.7 92.4  PLT 63* 84* 107* 113* 130*   Cardiac Enzymes: No results for input(s):  CKTOTAL, CKMB, CKMBINDEX, TROPONINI in the last 168 hours. BNP (last 3 results) No results for input(s): PROBNP in the last 8760 hours. CBG: No results for input(s): GLUCAP in the last 168 hours.  D-Dimer: No results for input(s): DDIMER in the last 72 hours. Hgb A1c: No results for input(s): HGBA1C in the last 72 hours. Lipid Profile: No results for input(s): CHOL, HDL, LDLCALC, TRIG, CHOLHDL, LDLDIRECT in the last 72 hours. Thyroid function studies: No results for input(s): TSH, T4TOTAL, T3FREE, THYROIDAB in the last 72 hours.  Invalid input(s): FREET3 Anemia work up: Recent Labs    12/09/20 0617  VITAMINB12 633  FOLATE 14.0  FERRITIN 37  TIBC 267  IRON 46  RETICCTPCT 4.1*   Sepsis Labs: Recent Labs  Lab 12/04/20 0416 12/07/20 0610 12/08/20 0956 12/09/20 0617  WBC 3.9* 1.5* 1.0* 1.7*    Microbiology Recent Results (from the past 240 hour(s))  Resp Panel by RT-PCR (Flu A&B, Covid) Nasopharyngeal Swab     Status: None  Collection Time: 12/04/20 11:07 AM   Specimen: Nasopharyngeal Swab; Nasopharyngeal(NP) swabs in vial transport medium  Result Value Ref Range Status   SARS Coronavirus 2 by RT PCR NEGATIVE NEGATIVE Final    Comment: (NOTE) SARS-CoV-2 target nucleic acids are NOT DETECTED.  The SARS-CoV-2 RNA is generally detectable in upper respiratory specimens during the acute phase of infection. The lowest concentration of SARS-CoV-2 viral copies this assay can detect is 138 copies/mL. A negative result does not preclude SARS-Cov-2 infection and should not be used as the sole basis for treatment or other patient management decisions. A negative result may occur with  improper specimen collection/handling, submission of specimen other than nasopharyngeal swab, presence of viral mutation(s) within the areas targeted by this assay, and inadequate number of viral copies(<138 copies/mL). A negative result must be combined with clinical observations, patient  history, and epidemiological information. The expected result is Negative.  Fact Sheet for Patients:  EntrepreneurPulse.com.au  Fact Sheet for Healthcare Providers:  IncredibleEmployment.be  This test is no t yet approved or cleared by the [place] FDA and  has been authorized for detection and/or diagnosis of SARS-CoV-2 by FDA under an Emergency Use Authorization (EUA). This EUA will remain  in effect (meaning this test can be used) for the duration of the COVID-19 declaration under Section 564(b)(1) of the Act, 21 U.S.C.section 360bbb-3(b)(1), unless the authorization is terminated  or revoked sooner.       Influenza A by PCR NEGATIVE NEGATIVE Final   Influenza B by PCR NEGATIVE NEGATIVE Final    Comment: (NOTE) The Xpert Xpress SARS-CoV-2/FLU/RSV plus assay is intended as an aid in the diagnosis of influenza from Nasopharyngeal swab specimens and should not be used as a sole basis for treatment. Nasal washings and aspirates are unacceptable for Xpert Xpress SARS-CoV-2/FLU/RSV testing.  Fact Sheet for Patients: EntrepreneurPulse.com.au  Fact Sheet for Healthcare Providers: IncredibleEmployment.be  This test is not yet approved or cleared by the [place] FDA and has been authorized for detection and/or diagnosis of SARS-CoV-2 by FDA under an Emergency Use Authorization (EUA). This EUA will remain in effect (meaning this test can be used) for the duration of the COVID-19 declaration under Section 564(b)(1) of the Act, 21 U.S.C. section 360bbb-3(b)(1), unless the authorization is terminated or revoked.  Performed at [facility], [address] [place], [place] [address]     Procedures and diagnostic studies:  No results found.             LOS: 30 days   [place] Copywriter, advertising on [email or web address]. If 7PM-7AM, please contact night-coverage at  [email or web address]     12/09/2020, 4:33 PM

## 2020-12-09 NOTE — Progress Notes (Signed)
Occupational Therapy Treatment Patient Details Name: Autumn [name] MRN: [id number] DOB: May 14, 1950 Today's Date: 12/09/2020   History of present illness 69 yo F admitted 10/29 s/p falls at home, worsening left foot chronic diabetic ulcer with osteomyelitis 2nd+3rd metatarsals and cellulitis noted on MRI, s/p L foot amputation of the 2nd metatarsal and toe, I&D deep abscess multiple fascial planes, and bone biopsy open deep third metatarsal on 10/31. Other comorbidities include sepsis without shock secondary to osteomyelitis and cellulitis left foot (IV zosyn), AKI on CKD, rapid afib (Cardizem + Heparin gtt), NSTEMI suspect demand ischemia, anemia acute on chronic. Transferred to ICU and intubated 11/2 after evolving neuro changes with unresponsiveness after coughing episode. 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   OT comments  Ms. Oltmann seen for OT treatment on this date. Upon arrival to room pt awake/alert but experiencing constant nausea. Pt agreeable to tx. Pt required MAX A for bathing, performed with nurse at bedlevel, pt performing 2 log rolls and holding on side for ~ 5 min/side. MAX A for UB dressing, bedlevel. Pt making good progress toward goals. Pt continues to benefit from skilled OT services to maximize return to PLOF and minimize risk of future falls, injury, caregiver burden, and readmission. Will continue to follow POC. Discharge recommendation remains appropriate.     Recommendations for follow up therapy are one component of a multi-disciplinary discharge planning process, led by the attending physician.  Recommendations may be updated based on patient status, additional functional criteria and insurance authorization.    Follow Up Recommendations  Skilled nursing-short term rehab (<3 hours/day)    Assistance Recommended at Discharge Frequent or constant  Supervision/Assistance  Equipment Recommendations  Other (comment) (defer to next venue of care)    Recommendations for Other Services      Precautions / Restrictions Precautions Precautions: Fall Restrictions Weight Bearing Restrictions: Yes LLE Weight Bearing: Non weight bearing       Mobility Bed Mobility Overal bed mobility: Needs Assistance Bed Mobility: Rolling Rolling: Mod assist              Transfers                         Balance                                           ADL either performed or assessed with clinical judgement   ADL Overall ADL's : Needs assistance/impaired                                       General ADL Comments: MAX A for bathing, performed with nurse at bedlevel, pt performing 2 log rolls. MAX A for UB dressing, bedlevel.    Extremity/Trunk Assessment               Cognition Arousal/Alertness: Awake/alert Behavior During Therapy: WFL for tasks assessed/performed Overall Cognitive Status: Within Functional Limits for tasks assessed                                            Exercises  Exercises: Other exercises Other Exercises Other Exercises: Pt educ re: importance of mvmt for retaining functional mobility Other Exercises: Bathing, complete bed linen change, ankle pumps x 10.   Shoulder Instructions       General Comments      Pertinent Vitals/ Pain       Pain Assessment: Faces Faces Pain Scale: Hurts even more Pain Location: thumb pad of L hand Pain Descriptors / Indicators: Sore;Tender Pain Intervention(s): Limited activity within patient's tolerance;Monitored during session  Home Living                                          Prior Functioning/Environment              Frequency  Min 3X/week        Progress Toward Goals  OT Goals(current goals can now be found in the care plan section)  Progress towards OT  goals: Progressing toward goals  Acute Rehab OT Goals OT Goal Formulation: With patient Time For Goal Achievement: 12/13/20 Potential to Achieve Goals: Good ADL Goals Pt Will Perform Grooming: sitting;with set-up Pt Will Perform Lower Body Dressing: with mod assist;sitting/lateral leans Pt Will Transfer to Toilet: bedside commode;with max assist;anterior/posterior transfer Pt/caregiver will Perform Home Exercise Program: Increased ROM;Increased strength;Both right and left upper extremity;With minimal assist  Plan Discharge plan remains appropriate;Frequency remains appropriate    Co-evaluation                 AM-PAC OT "6 Clicks" Daily Activity     Outcome Measure   Help from another person eating meals?: A Little Help from another person taking care of personal grooming?: A Little Help from another person toileting, which includes using toliet, bedpan, or urinal?: A Lot Help from another person bathing (including washing, rinsing, drying)?: A Lot Help from another person to put on and taking off regular upper body clothing?: A Little Help from another person to put on and taking off regular lower body clothing?: A Lot 6 Click Score: 15    End of Session Equipment Utilized During Treatment: Oxygen  OT Visit Diagnosis: Other abnormalities of gait and mobility (R26.89);Hemiplegia and hemiparesis;Muscle weakness (generalized) (M62.81)   Activity Tolerance Patient limited by fatigue;Other (comment) (nausea)   Patient Left in bed;with call bell/phone within reach;with bed alarm set;with nursing/sitter in room   Nurse Communication          Time: 4709-6283 OT Time Calculation (min): 27 min  Charges: OT General Charges $OT Visit: 1 Visit OT Treatments $Self Care/Home Management : 23-37 mins  Nino Glow, Markus Daft 12/09/2020, 3:50 PM

## 2020-12-09 NOTE — Progress Notes (Signed)
Date of Admission:  11/09/2020     ID: Autumn [name] is a 70 y.o. female  Principal Problem:   Sepsis ([place]) Active Problems:   Atrial fibrillation with RVR ([place])   Osteomyelitis of foot, left, acute ([place])   CKD stage 4 due to type 2 diabetes mellitus ([place])   Liver cirrhosis secondary to NASH (nonalcoholic steatohepatitis) ([place])   Hypertension   History of anemia due to CKD   Frequent falls   AKI (acute kidney injury) ([place])   Cellulitis and abscess of foot, except toes   Infectious tenosynovitis   Wheeze   Atrial fibrillation with rapid ventricular response (HCC)   Bilateral carotid artery stenosis   Neutropenia (HCC)   Drug-induced neutropenia (HCC)   Normocytic anemia   Thrombocytopenia ([place])   70 y.o.female  with a history of DM, CKD, liver cirrhosis secondary to [facility], hypertension, thyroid disease presents to the ED on 11/09/2020 with multiple falls.  Patient has a chronic left foot diabetic ulcer.and is followed at [facility].  Pt presented to the ED by EMS because of multiple falls On presentation she was in Afib with RVR. AKI. Anemia with Hb of 8 .  CT head no evidence of acute findings.  MRI of the left  foot showed severe soft tissue swelling around the second metatarsal neck concerning for phlegmon/developing abscess measuring 2.1 into 1 cm.  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 She was started on vanco/cefepime and flagyl She was taken for 2 nd ray excision on 11/11/20.During that surgery purulence was noted to track to the ankle joint along the extensor tendons. A biopsy was taken from the 3rd met head I was initially consulted on 11/12/20 for the foot infection. Changed the antibiotics to zosyn because of hallucination on 11/2 she was intubated for resp distress. MRI showed Acute to early subacute infarction in the left posterior frontal cortical and subcortical brain, possibly affecting  the precentral gyrus.pt was extubated the next day- She was aphasic with rt hemiparesis. She received blood transfusion for anemia Culture from the surgical wound had MSSA, strep mitis and pasteurella and bacteroides- Zosyn changed to unasyn on 11//3/22 Heparin which was intially started for the acute stroke had to be stopped because of bleeding from the surgical wound on the left foot. On 11/22/20 I had signed off with the plan of continuing unasyn until 12/09/20 and then switching to Po augmentin till 12/23/20 for the left foot osteomyelitis. I am asked to see her again because of neutropenia Pt on 11/26/20 had melena with blood clots- eliquis was put on hold was seen by GI and had endoscopy  on 11/27/20 which  showed candida. On 11/29/20 underwent colonoscopy and a few polyps removed Pts wbc was in the lower 3s on 11/22, 11/23 and on 11/26 it was 1.6 and on 11/27 it was 1.0/ ID was consulted over weekend and drug induced neutropenia was a concern and unasyn was discontinued and was switched to vanco/cipro and flagyl Pt says she has had nausea for 3 days No fever or chills No pain abdomen  Subjective: Pt says she has had nausea for 3 days No fever or chills No pain abdomen  Medications:   (feeding supplement) PROSource Plus  30 mL Oral TID BM   allopurinol  50 mg Oral QODAY   apixaban  5 mg Oral BID   vitamin C  500 mg Oral BID   atorvastatin  40 mg Oral Daily   budesonide (PULMICORT) nebulizer solution  0.5 mg Nebulization BID   bumetanide  4 mg Oral Daily   diltiazem  180 mg Oral Daily   feeding supplement  1 Container Oral TID BM   ferrous gluconate  324 mg Oral Q breakfast   hydrALAZINE  100 mg Oral Q8H   levothyroxine  100 mcg Oral Q0600   mouth rinse  15 mL Mouth Rinse BID   metoprolol tartrate  50 mg Oral BID   multivitamin with minerals  1 tablet Oral Daily   pantoprazole  40 mg Oral BID   sodium chloride flush  3 mL Intravenous Q12H   sodium chloride flush  3 mL Intravenous  Q12H   spironolactone  25 mg Oral Daily   Tbo-Filgrastim  480 mcg Subcutaneous Daily   venlafaxine  50 mg Oral BID    Objective: Vital signs in last 24 hours: Temp:  [97.3 F (36.3 C)-98.5 F (36.9 C)] 97.8 F (36.6 C) (11/28 0829) Pulse Rate:  [83-130] 97 (11/28 0829) Resp:  [16-20] 18 (11/28 0829) BP: (128-151)/(68-99) 134/83 (11/28 0829) SpO2:  [90 %-98 %] 96 % (11/28 0829) Weight:  [95.9 kg] 95.9 kg (11/28 0500)  PHYSICAL EXAM:  General: Alert, cooperative, no distress, speech much improved Head: Normocephalic, without obvious abnormality, atraumatic. Eyes: Conjunctivae clear, anicteric sclerae. Pupils are equal ENT Nares normal. No drainage or sinus tenderness. Lips, mucosa, and tongue normal. No Thrush Neck: Supple, symmetrical, no adenopathy, thyroid: non tender no carotid bruit and no JVD. Lungs: b/l air entry Heart: rate controlled- irregular Abdomen: Soft, non-tender,not distended. Bowel sounds normal. No masses Extremities:left thumb tenderness  left foot wound On the dorsal aspect the surgical site is not has dehiscence 2nd toe amputation - on the plantar aspect the small ulcerating wound is covered with wound vac 12/09/20     11/21/20   Skin: No rashes or lesions. Or bruising Lymph: Cervical, supraclavicular normal. Neurologic: rt hemiparesis  Lab Results  Recent Labs    12/08/20 0956 12/09/20 0617  WBC 1.0* 1.7*  HGB 8.2* 8.4*  HCT 25.4* 26.7*  NA 139 137  K 3.6 4.6  CL 106 104  CO2 23 24  BUN 56* 53*  CREATININE 3.50* 3.57*   Microbiology: 10/30 MRSA PCR: (-) 10/31 Wound (2nd metatarsal): strep mitis/oralis, pasteurella, MSSA, bacteroides 10/31 Wound (metatarsal): bacteroides, peptostreptococcus micros 10/31 Abscess (left foot): MSSA, pasteurella 11/2 Bcx Ng5d (final)    Assessment/Plan:  Neutropenia- could be drug ( antibiotic induced) she has underlying cirrhosis but has not been leucopenic before Unasyn DC-  On GCSF  Left  foot infection- s/p 2 nd toe amputation- residual osteo Culture MSSA/Strep mitis/Bacteroides and pasteurella- Pt was on unasyn which has been DC for neutropenia- Currently on vanco/ceftriaxone and flagyl- DC vanco and cipro and switch to doxy to cover the aerobes- Flagyl will cover the anerobe bacteroides- Will give antibiotics until 12/23/20- Once nausea resolves both the antibiotics can be converted to oral .  Esophageal candidiasis- has been adequately treated since 11/28/20- no furter fluconazole needed  Afib- controlled on diltiazem Restarted eliquis  GI bleed- seen by GI endo/colonoscopy essentially normal except for polyps- thought to be due to anticoagulation  AKI on CKD  DM  CVA with rt hemiparesis  Anemia received multiple PRBC   Hypothyroidism- on synthroid  NASH with cirrhosis Discussed the management with care team

## 2020-12-09 NOTE — Progress Notes (Signed)
SLP Cancellation Note  Patient Details Name: Autumn [name] MRN: [id number] DOB: 30-Mar-1950   Cancelled treatment:    Attempted speech/cognitive-linguistic treatment. Pt declined at this time due to nausea. Pt provided with Ginger Ale per pt request. RN made aware of pt's complaints.   Cherrie Gauze, M.S., [facility] [phone] ([place])  Quintella Baton 12/09/2020, 1:41 PM

## 2020-12-10 DIAGNOSIS — M86172 Other acute osteomyelitis, left ankle and foot: Secondary | ICD-10-CM | POA: Diagnosis not present

## 2020-12-10 DIAGNOSIS — A419 Sepsis, unspecified organism: Secondary | ICD-10-CM | POA: Diagnosis not present

## 2020-12-10 DIAGNOSIS — R6520 Severe sepsis without septic shock: Secondary | ICD-10-CM | POA: Diagnosis not present

## 2020-12-10 DIAGNOSIS — I4891 Unspecified atrial fibrillation: Secondary | ICD-10-CM | POA: Diagnosis not present

## 2020-12-10 LAB — CBC WITH DIFFERENTIAL/PLATELET
Abs Immature Granulocytes: 0.01 10*3/uL (ref 0.00–0.07)
Basophils Absolute: 0 10*3/uL (ref 0.0–0.1)
Basophils Relative: 2 %
Eosinophils Absolute: 0 10*3/uL (ref 0.0–0.5)
Eosinophils Relative: 2 %
HCT: 25.6 % — ABNORMAL LOW (ref 36.0–46.0)
Hemoglobin: 8.2 g/dL — ABNORMAL LOW (ref 12.0–15.0)
Immature Granulocytes: 1 %
Lymphocytes Relative: 23 %
Lymphs Abs: 0.5 10*3/uL — ABNORMAL LOW (ref 0.7–4.0)
MCH: 29.7 pg (ref 26.0–34.0)
MCHC: 32 g/dL (ref 30.0–36.0)
MCV: 92.8 fL (ref 80.0–100.0)
Monocytes Absolute: 1.4 10*3/uL — ABNORMAL HIGH (ref 0.1–1.0)
Monocytes Relative: 67 %
Neutro Abs: 0.1 10*3/uL — CL (ref 1.7–7.7)
Neutrophils Relative %: 5 %
Platelets: 122 10*3/uL — ABNORMAL LOW (ref 150–400)
RBC: 2.76 MIL/uL — ABNORMAL LOW (ref 3.87–5.11)
RDW: 17.2 % — ABNORMAL HIGH (ref 11.5–15.5)
Smear Review: NORMAL
WBC: 2 10*3/uL — ABNORMAL LOW (ref 4.0–10.5)
nRBC: 0 % (ref 0.0–0.2)

## 2020-12-10 LAB — CREATININE, SERUM
Creatinine, Ser: 3.7 mg/dL — ABNORMAL HIGH (ref 0.44–1.00)
GFR, Estimated: 13 mL/min — ABNORMAL LOW (ref >60)

## 2020-12-10 MED ORDER — SODIUM CHLORIDE 0.9 % IV SOLN: INTRAVENOUS | Status: DC

## 2020-12-10 NOTE — Progress Notes (Signed)
PT Cancellation Note  Patient Details Name: Autumn [name] MRN: [id number] DOB: [birth year]   Cancelled Treatment:    Reason Eval/Treat Not Completed: Other (comment).  Chart reviewed.  Nurse cleared pt for therapy participation.  Pt resting in bed upon PT arrival and reporting she was able to eat really well today for the first time and was feeling really full; between feeling really full and getting intermittent waves of nausea (pt reports d/t IV antibiotics running) pt declined any physical therapy (pt pleasant and apologetic).  Will re-attempt PT treatment session at a later date/time.  Leitha Bleak, PT 12/10/20, 12:14 PM

## 2020-12-10 NOTE — Progress Notes (Signed)
[facility]  PROGRESS NOTE   Subjective:   Patient seen resting comfortably, awaiting breakfast Tolerating small meals Denies shortness of breath  Creatinine slightly elevated at 3.7  Objective:  Vital signs in last 24 hours:  Temp:  [97.8 F (36.6 C)-98.8 F (37.1 C)] 98 F (36.7 C) (11/29 1147) Pulse Rate:  [83-104] 85 (11/29 1147) Resp:  [18-20] 18 (11/29 1147) BP: (118-138)/(73-98) 130/76 (11/29 1147) SpO2:  [93 %-100 %] 94 % (11/29 1147) Weight:  [92.2 kg] 92.2 kg (11/29 0454)  Weight change: -3.7 kg Filed Weights   12/08/20 0500 12/09/20 0500 12/10/20 0454  Weight: 97.4 kg 95.9 kg 92.2 kg    Intake/Output: I/O last 3 completed shifts: In: 413 [I.V.:3; IV Piggyback:410] Out: 1150 [Urine:1150]   Intake/Output this shift:  Total I/O In: -  Out: 700 [Urine:700]  Physical Exam: General:  No acute distress, sitting on the side of her bed  Head:  Normocephalic, atraumatic. Moist oral mucosal membranes  Lungs:   normal effort on room air, clear bilaterally  Heart:  irregular  Abdomen:   Soft, nondistended  Extremities: Trace right lower extremity peripheral edema. Gauze dressing left foot  Neurologic:  Awake, alert, following commands  Skin:  No lesions       Basic Metabolic Panel: Recent Labs  Lab 12/04/20 0416 12/08/20 0956 12/09/20 0617 12/10/20 0513  NA 138 139 137  --   K 3.6 3.6 4.6  --   CL 107 106 104  --   CO2 24 23 24   --   GLUCOSE 122* 112* 104*  --   BUN 53* 56* 53*  --   CREATININE 3.57* 3.50* 3.57* 3.70*  CALCIUM 8.2* 8.5* 8.6*  --   MG  --  2.1  --   --      CBC: Recent Labs  Lab 12/04/20 0416 12/07/20 0610 12/08/20 0956 12/09/20 0617 12/10/20 0933  WBC 3.9* 1.5* 1.0* 1.7* 2.0*  NEUTROABS  --   --  0.0* 0.1* 0.1*  HGB 8.5* 7.9* 8.2* 8.4* 8.2*  HCT 26.4* 24.5* 25.4* 26.7* 25.6*  MCV 92.6 93.2 91.7 92.4 92.8  PLT 84* 107* 113* 130* 122*      Urinalysis: No results for input(s): COLORURINE, LABSPEC,  PHURINE, GLUCOSEU, HGBUR, BILIRUBINUR, KETONESUR, PROTEINUR, UROBILINOGEN, NITRITE, LEUKOCYTESUR in the last 72 hours.  Invalid input(s): APPERANCEUR    Imaging: No results found.   Medications:    sodium chloride 10 mL/hr at 12/05/20 0604   sodium chloride Stopped (12/06/20 1004)   sodium chloride 250 mL (11/25/20 1208)   sodium chloride 50 mL/hr at 12/10/20 0956   doxycycline (VIBRAMYCIN) IV 100 mg (12/10/20 0959)   metronidazole 500 mg (12/10/20 1318)    (feeding supplement) PROSource Plus  30 mL Oral TID BM   allopurinol  50 mg Oral QODAY   apixaban  5 mg Oral BID   vitamin C  500 mg Oral BID   atorvastatin  40 mg Oral Daily   budesonide (PULMICORT) nebulizer solution  0.5 mg Nebulization BID   bumetanide  4 mg Oral Daily   diltiazem  180 mg Oral Daily   feeding supplement  1 Container Oral TID BM   ferrous gluconate  324 mg Oral Q breakfast   hydrALAZINE  100 mg Oral Q8H   levothyroxine  100 mcg Oral Q0600   mouth rinse  15 mL Mouth Rinse BID   metoprolol tartrate  50 mg Oral BID   multivitamin with minerals  1 tablet  Oral Daily   pantoprazole  40 mg Oral BID   sodium chloride flush  3 mL Intravenous Q12H   sodium chloride flush  3 mL Intravenous Q12H   spironolactone  25 mg Oral Daily   Tbo-Filgrastim  480 mcg Subcutaneous Daily   venlafaxine  50 mg Oral BID    Assessment/ Plan:     Principal Problem:   Sepsis ([place]) Active Problems:   Atrial fibrillation with RVR (HCC)   Osteomyelitis of foot, left, acute (HCC)   CKD stage 4 due to type 2 diabetes mellitus (HCC)   Liver cirrhosis secondary to NASH (nonalcoholic steatohepatitis) (HCC)   Hypertension   History of anemia due to CKD   Frequent falls   AKI (acute kidney injury) ([place])   Cellulitis and abscess of foot, except toes   Infectious tenosynovitis   Wheeze   Atrial fibrillation with rapid ventricular response (HCC)   Bilateral carotid artery stenosis   Neutropenia (HCC)   Drug-induced neutropenia  (HCC)   Normocytic anemia   Thrombocytopenia ([place])  Autumn [name] is a 70 y.o. white female with hypertension, diabetes mellitus type II, peripheral vascular disease, sleep apnea, atrial fibrillation who is admitted to [facility] on 11/09/2020 for Atrial fibrillation with rapid ventricular response ([place]) [I48.91] AKI (acute kidney injury) ([place]) [N17.9] Osteomyelitis of foot, left, acute ([place]) [B09.628] Atrial fibrillation with RVR ([place]) [I48.91] Worsening renal function [N28.9] Syncope, unspecified syncope type [R55]  Hospital course complicated by acute kidney injury and requiring amputation on 10/29 by Dr. Sherryle Lis. Patient with atrial fibrillation and acute CVA this admission.   #1: Acute kidney injury on chronic kidney disease stage IV with proteinuria: baseline creatinine of 3.19, GFR of 15 on 09/27/2020. Chronic kidney disease secondary to diabetes. Followed by Dr. Radene Knee, [facility]. Holding lisinopril -restarting as outpatient can be considered Acute kidney injury secondary to ATN - Bumetanide 4mg  PO daily with spironolactone 25 mg daily -Creatinine slightly increased today, overnight urine output 620ml -We will order gentle hydration normal saline at 50 mL/h   #2: Osteomyelitis: status post left 2nd metatarsal amputation.  Culture shows MSSA/strep mitis/bacteroids and Pasteurella.  Currently prescribed doxycycline and metronidazole for antibiotic therapy.  ID following   #3: Anemia with chronic kidney disease: status post PRBC transfusions. Appreciate GI input. Status post EGD and colonoscopy.  Dr. Haig Prophet on November 18, 2022Noted to have internal hemorrhoids, multiple polyps removed, diverticulosis in sigmoid and descending colon. - Hgb 8.2   #4: Diabetes mellitus type II with chronic kidney disease: insulin dependent. Hemoglobin A1c of 6.3% on November 09, 2020    LOS: [facility] 11/29/20221:55 PM

## 2020-12-10 NOTE — Progress Notes (Addendum)
Progress Note    Autumn [name]  FOY:[id number] DOB: [birth year]  DOA: 11/09/2020 PCP: Harlow Ohms, MD      Brief Narrative:    Medical records reviewed and are as summarized below:  Autumn [name] is a 70 y.o. 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  She'd been following with a [facility] podiatrist outpatient and had recently refused surgery.  She'd recently been treated with antibiotics at [facility] for the diabetic foot infection with enterobacter aerogans culture positive.  She was admitted on 10/29 due to falls at home with worsening L foot chronic diabetic ulcer with osteomyelitis of the 2nd and 3rd metatarsals and cellulitis noted on MRI.  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  Her post operative course was complicated by unresponsiveness and neurologic changes. She was found to have a stroke and was intubated for airway protection.  She was extubated 11/3 and transferred to [facility] service on 11/4.  Hospital course further complicated by acute on chronic anemia with melena after starting on anticoagulation, requiring blood transfusions and GI evaluation.   She developed severe neutropenia and this was attributed to IV Unasyn.  Antibiotics were adjusted and she was started on Neupogen. She developed atrial fibrillation with RVR which is improved with rate control drugs (metoprolol and Cardizem).     Assessment/Plan:   Principal Problem:   Sepsis ([place]) Active Problems:   Atrial fibrillation with RVR (HCC)   Osteomyelitis of foot, left, acute ([place])   CKD stage 4 due to type 2 diabetes mellitus ([place])   Liver cirrhosis secondary to NASH (nonalcoholic steatohepatitis) (HCC)   Hypertension   History of anemia due to CKD   Frequent falls   AKI (acute kidney injury) ([place])   Cellulitis and abscess of foot, except  toes   Infectious tenosynovitis   Wheeze   Atrial fibrillation with rapid ventricular response (HCC)   Bilateral carotid artery stenosis   Neutropenia (HCC)   Drug-induced neutropenia (HCC)   Normocytic anemia   Thrombocytopenia (HCC)   Nutrition Problem: Increased nutrient needs Etiology: wound healing  Signs/Symptoms: estimated needs   Body mass index is 32.81 kg/m.  (Obesity)  Septic shock from left diabetic foot infection, left foot osteomyelitis and cellulitis, septic arthritis of second MTP joint: S/p amputation ray second left metatarsal, I&D deep abscess multiple fascial planes of the left foot on 11/12/2020.  Of note, surgical wound culture showed strep mitis/oralis, staff aureus, abundant Bacteroides species and rare Pasteurella multocida. IV antibiotics have been changed to IV doxycycline and IV Flagyl.  Follow-up with ID specialist for further recommendations  Severe leukopenia/neutropenia: Neutrophil count is still very low.  Continue Neupogen.    Atrial fibrillation with RVR,, hypertension: Chest pressure has resolved.  Troponin was unremarkable. Continue Cardizem, metoprolol and Eliquis.    Acute stroke with expressive aphasia and dysphagia: Continue Lipitor and Eliquis.  Continue dysphagia 3 diet.  Acute GI bleeding/melena on 11/26/2020: EGD on 11/28/2018 showed just an esophageal mucosa, gastritis, erythematous duodenopathy.  Colonoscopy 11/29/2020 showed multiple polyps that were removed but the colon prep was poor.  Continue Protonix.  Esophageal candidiasis: Fluconazole was discontinued on 12/09/2020 because of neutropenia.  Right hand/thumb pain and stiffness: Probably from arthritis/gout.  Continue allopurinol.  Analgesics as needed.  Uric acid was 6.8.  Acute hypoxic respiratory  failure, probable OSA: Continue BiPAP at night  Acute on chronic diastolic CHF: 2D echo showed normal EF, mild LVH, severely dilated left atrium, indeterminate LV diastolic  parameters.   Aspiration pneumonia: Completed treatment  Acute metabolic encephalopathy, thrombocytopenia: Improved  Other comorbidities include CKD stage IV, NASH liver cirrhosis, hypothyroidism, type II DM     Diet Order             Diet Heart Room service appropriate? Yes; Fluid consistency: Thin  Diet effective now                      Consultants: Gastroenterologist Intensivist Neurologist Infectious disease specialist Hematologist  Procedures: Intubation on 11/13/2020 EGD and colonoscopy  Arch aortogram, selective cannulation of the left common carotid artery Amputation ray second metatarsal and, I&D deep abscess multiple fascial planes, bone biopsy third metatarsal    Medications:    (feeding supplement) PROSource Plus  30 mL Oral TID BM   allopurinol  50 mg Oral QODAY   apixaban  5 mg Oral BID   vitamin C  500 mg Oral BID   atorvastatin  40 mg Oral Daily   budesonide (PULMICORT) nebulizer solution  0.5 mg Nebulization BID   bumetanide  4 mg Oral Daily   diltiazem  180 mg Oral Daily   feeding supplement  1 Container Oral TID BM   ferrous gluconate  324 mg Oral Q breakfast   hydrALAZINE  100 mg Oral Q8H   levothyroxine  100 mcg Oral Q0600   mouth rinse  15 mL Mouth Rinse BID   metoprolol tartrate  50 mg Oral BID   multivitamin with minerals  1 tablet Oral Daily   pantoprazole  40 mg Oral BID   sodium chloride flush  3 mL Intravenous Q12H   sodium chloride flush  3 mL Intravenous Q12H   spironolactone  25 mg Oral Daily   Tbo-Filgrastim  480 mcg Subcutaneous Daily   venlafaxine  50 mg Oral BID   Continuous Infusions:  sodium chloride 10 mL/hr at 12/05/20 0604   sodium chloride Stopped (12/06/20 1004)   sodium chloride 250 mL (11/25/20 1208)   sodium chloride 50 mL/hr at 12/10/20 0956   doxycycline (VIBRAMYCIN) IV 100 mg (12/10/20 0959)   metronidazole 500 mg (12/10/20 1318)     Anti-infectives (From admission, onward)    Start      Dose/Rate Route Frequency Ordered Stop   12/10/20 1200  vancomycin (VANCOREADY) IVPB 750 mg/150 mL  Status:  Discontinued        750 mg 150 mL/hr over 60 Minutes Intravenous Every 48 hours 12/08/20 1236 12/09/20 2017   12/10/20 1000  doxycycline (VIBRAMYCIN) 100 mg in sodium chloride 0.9 % 250 mL IVPB        100 mg 125 mL/hr over 120 Minutes Intravenous Every 12 hours 12/09/20 2017     12/08/20 1400  metroNIDAZOLE (FLAGYL) IVPB 500 mg        500 mg 100 mL/hr over 60 Minutes Intravenous Every 12 hours 12/08/20 1231     12/08/20 1400  ciprofloxacin (CIPRO) IVPB 400 mg  Status:  Discontinued        400 mg 200 mL/hr over 60 Minutes Intravenous Every 24 hours 12/08/20 1231 12/09/20 2017   12/08/20 1315  vancomycin (VANCOREADY) IVPB 2000 mg/400 mL        2,000 mg 200 mL/hr over 120 Minutes Intravenous  Once 12/08/20 1218 12/08/20 1506   12/05/20 1130  fluconazole (DIFLUCAN)  tablet 200 mg  Status:  Discontinued        200 mg Oral Daily 12/05/20 1046 12/09/20 0725   11/30/20 1400  fluconazole (DIFLUCAN) IVPB 200 mg  Status:  Discontinued        200 mg 100 mL/hr over 60 Minutes Intravenous Every 24 hours 11/29/20 1633 12/05/20 1044   11/29/20 1800  fluconazole (DIFLUCAN) IVPB 400 mg        400 mg 100 mL/hr over 120 Minutes Intravenous  Once 11/29/20 1633 11/30/20 0724   11/29/20 1700  fluconazole (DIFLUCAN) IVPB 200 mg  Status:  Discontinued        200 mg 100 mL/hr over 60 Minutes Intravenous Every 24 hours 11/29/20 1611 11/29/20 1633   11/23/20 0000  ceFAZolin (ANCEF) IVPB 1 g/50 mL premix       Note to Pharmacy: To be given in specials   1 g 100 mL/hr over 30 Minutes Intravenous  Once 11/22/20 1106 11/22/20 1750   11/14/20 2200  Ampicillin-Sulbactam (UNASYN) 3 g in sodium chloride 0.9 % 100 mL IVPB  Status:  Discontinued        3 g 200 mL/hr over 30 Minutes Intravenous Every 12 hours 11/14/20 1610 12/08/20 1143   11/14/20 0900  piperacillin-tazobactam (ZOSYN) IVPB 2.25 g  Status:   Discontinued        2.25 g 100 mL/hr over 30 Minutes Intravenous Every 8 hours 11/14/20 0805 11/14/20 1609   11/13/20 2200  piperacillin-tazobactam (ZOSYN) IVPB 3.375 g  Status:  Discontinued        3.375 g 12.5 mL/hr over 240 Minutes Intravenous Every 12 hours 11/13/20 0758 11/14/20 0805   11/13/20 0600  piperacillin-tazobactam (ZOSYN) IVPB 2.25 g        2.25 g 100 mL/hr over 30 Minutes Intravenous Every 8 hours 11/12/20 2155 11/13/20 1725   11/11/20 1600  vancomycin (VANCOCIN) IVPB 1000 mg/200 mL premix  Status:  Discontinued        1,000 mg 200 mL/hr over 60 Minutes Intravenous Every 48 hours 11/09/20 1618 11/10/20 1045   11/11/20 1558  vancomycin (VANCOCIN) powder  Status:  Discontinued          As needed 11/11/20 1558 11/11/20 1558   11/10/20 2000  vancomycin (VANCOCIN) IVPB 1000 mg/200 mL premix  Status:  Discontinued        1,000 mg 200 mL/hr over 60 Minutes Intravenous Every 48 hours 11/10/20 1918 11/12/20 0940   11/09/20 1800  ceFEPIme (MAXIPIME) 2 g in sodium chloride 0.9 % 100 mL IVPB  Status:  Discontinued        2 g 200 mL/hr over 30 Minutes Intravenous Every 24 hours 11/09/20 1619 11/12/20 2146   11/09/20 1619  vancomycin variable dose per unstable renal function (pharmacist dosing)  Status:  Discontinued         Does not apply See admin instructions 11/09/20 1619 11/12/20 1521   11/09/20 1615  vancomycin (VANCOREADY) IVPB 1750 mg/350 mL        1,750 mg 175 mL/hr over 120 Minutes Intravenous  Once 11/09/20 1606 11/09/20 1947   11/09/20 1600  metroNIDAZOLE (FLAGYL) IVPB 500 mg  Status:  Discontinued        500 mg 100 mL/hr over 60 Minutes Intravenous Every 8 hours 11/09/20 1555 11/12/20 2146              Family Communication/Anticipated D/C date and plan/Code Status   DVT prophylaxis: Place and maintain sequential compression device Start: 11/15/20 1527 apixaban (ELIQUIS)  tablet 5 mg     Code Status: Full Code  Family Communication: None Disposition Plan:  Plan to discharge to SNF tomorrow if okay with ID specialist and hematologist    Status is: Inpatient  Remains inpatient appropriate because: Severe neutropenia           Subjective:   Interval events noted.  She feels better.  No fever, nausea, vomiting, chest pain, palpitations or shortness of breath.  Objective:    Vitals:   12/10/20 0727 12/10/20 0746 12/10/20 1147 12/10/20 1621  BP: 118/73  130/76 139/87  Pulse: 83  85 88  Resp: 18  18 18   Temp: 97.8 F (36.6 C)  98 F (36.7 C) 97.8 F (36.6 C)  TempSrc:    Oral  SpO2: 93% 99% 94% 93%  Weight:      Height:       No data found.   Intake/Output Summary (Last 24 hours) at 12/10/2020 1631 Last data filed at 12/10/2020 1625 Gross per 24 hour  Intake 409.99 ml  Output 1725 ml  Net -1315.01 ml   Filed Weights   12/08/20 0500 12/09/20 0500 12/10/20 0454  Weight: 97.4 kg 95.9 kg 92.2 kg    Exam:  GEN: NAD SKIN: No rash EYES: EOMI ENT: MMM CV: Irregular rate and rhythm PULM: CTA B ABD: soft, obees, NT, +BS CNS: AAO x 3, non focal EXT: Left foot surgical wound connected to wound VAC      Data Reviewed:   I have personally reviewed following labs and imaging studies:  Labs: Labs show the following:   Basic Metabolic Panel: Recent Labs  Lab 12/04/20 0416 12/08/20 0956 12/09/20 0617 12/10/20 0513  NA 138 139 137  --   K 3.6 3.6 4.6  --   CL 107 106 104  --   CO2 24 23 24   --   GLUCOSE 122* 112* 104*  --   BUN 53* 56* 53*  --   CREATININE 3.57* 3.50* 3.57* 3.70*  CALCIUM 8.2* 8.5* 8.6*  --   MG  --  2.1  --   --    GFR Estimated Creatinine Clearance: 16.7 mL/min (A) (by C-G formula based on SCr of 3.7 mg/dL (H)). Liver Function Tests: No results for input(s): AST, ALT, ALKPHOS, BILITOT, PROT, ALBUMIN in the last 168 hours. No results for input(s): LIPASE, AMYLASE in the last 168 hours. No results for input(s): AMMONIA in the last 168 hours. Coagulation profile No results for  input(s): INR, PROTIME in the last 168 hours.  CBC: Recent Labs  Lab 12/04/20 0416 12/07/20 0610 12/08/20 0956 12/09/20 0617 12/10/20 0933  WBC 3.9* 1.5* 1.0* 1.7* 2.0*  NEUTROABS  --   --  0.0* 0.1* 0.1*  HGB 8.5* 7.9* 8.2* 8.4* 8.2*  HCT 26.4* 24.5* 25.4* 26.7* 25.6*  MCV 92.6 93.2 91.7 92.4 92.8  PLT 84* 107* 113* 130* 122*   Cardiac Enzymes: No results for input(s): CKTOTAL, CKMB, CKMBINDEX, TROPONINI in the last 168 hours. BNP (last 3 results) No results for input(s): PROBNP in the last 8760 hours. CBG: No results for input(s): GLUCAP in the last 168 hours.  D-Dimer: No results for input(s): DDIMER in the last 72 hours. Hgb A1c: No results for input(s): HGBA1C in the last 72 hours. Lipid Profile: No results for input(s): CHOL, HDL, LDLCALC, TRIG, CHOLHDL, LDLDIRECT in the last 72 hours. Thyroid function studies: No results for input(s): TSH, T4TOTAL, T3FREE, THYROIDAB in the last 72 hours.  Invalid input(s): FREET3  Anemia work up: Recent Labs    12/09/20 0617  VITAMINB12 633  FOLATE 14.0  FERRITIN 37  TIBC 267  IRON 46  RETICCTPCT 4.1*   Sepsis Labs: Recent Labs  Lab 12/07/20 0610 12/08/20 0956 12/09/20 0617 12/10/20 0933  WBC 1.5* 1.0* 1.7* 2.0*    Microbiology Recent Results (from the past 240 hour(s))  Resp Panel by RT-PCR (Flu A&B, Covid) Nasopharyngeal Swab     Status: None   Collection Time: 12/04/20 11:07 AM   Specimen: Nasopharyngeal Swab; Nasopharyngeal(NP) swabs in vial transport medium  Result Value Ref Range Status   SARS Coronavirus 2 by RT PCR NEGATIVE NEGATIVE Final    Comment: (NOTE) SARS-CoV-2 target nucleic acids are NOT DETECTED.  The SARS-CoV-2 RNA is generally detectable in upper respiratory specimens during the acute phase of infection. The lowest concentration of SARS-CoV-2 viral copies this assay can detect is 138 copies/mL. A negative result does not preclude SARS-Cov-2 infection and should not be used as the sole  basis for treatment or other patient management decisions. A negative result may occur with  improper specimen collection/handling, submission of specimen other than nasopharyngeal swab, presence of viral mutation(s) within the areas targeted by this assay, and inadequate number of viral copies(<138 copies/mL). A negative result must be combined with clinical observations, patient history, and epidemiological information. The expected result is Negative.  Fact Sheet for Patients:  EntrepreneurPulse.com.au  Fact Sheet for Healthcare Providers:  IncredibleEmployment.be  This test is no t yet approved or cleared by the [place] FDA and  has been authorized for detection and/or diagnosis of SARS-CoV-2 by FDA under an Emergency Use Authorization (EUA). This EUA will remain  in effect (meaning this test can be used) for the duration of the COVID-19 declaration under Section 564(b)(1) of the Act, 21 U.S.C.section 360bbb-3(b)(1), unless the authorization is terminated  or revoked sooner.       Influenza A by PCR NEGATIVE NEGATIVE Final   Influenza B by PCR NEGATIVE NEGATIVE Final    Comment: (NOTE) The Xpert Xpress SARS-CoV-2/FLU/RSV plus assay is intended as an aid in the diagnosis of influenza from Nasopharyngeal swab specimens and should not be used as a sole basis for treatment. Nasal washings and aspirates are unacceptable for Xpert Xpress SARS-CoV-2/FLU/RSV testing.  Fact Sheet for Patients: EntrepreneurPulse.com.au  Fact Sheet for Healthcare Providers: IncredibleEmployment.be  This test is not yet approved or cleared by the [place] FDA and has been authorized for detection and/or diagnosis of SARS-CoV-2 by FDA under an Emergency Use Authorization (EUA). This EUA will remain in effect (meaning this test can be used) for the duration of the COVID-19 declaration under Section 564(b)(1) of the Act,  21 U.S.C. section 360bbb-3(b)(1), unless the authorization is terminated or revoked.  Performed at [facility], [address] [place], [place] [address]     Procedures and diagnostic studies:  No results found.             LOS: 31 days   [place] Copywriter, advertising on [email or web address]. If 7PM-7AM, please contact night-coverage at [email or web address]     12/10/2020, 4:31 PM

## 2020-12-10 NOTE — Progress Notes (Signed)
Date of Admission:  11/09/2020     ID: Autumn [name] is a 70 y.o. female  Principal Problem:   Sepsis ([place]) Active Problems:   Atrial fibrillation with RVR ([place])   Osteomyelitis of foot, left, acute ([place])   CKD stage 4 due to type 2 diabetes mellitus ([place])   Liver cirrhosis secondary to NASH (nonalcoholic steatohepatitis) ([place])   Hypertension   History of anemia due to CKD   Frequent falls   AKI (acute kidney injury) ([place])   Cellulitis and abscess of foot, except toes   Infectious tenosynovitis   Wheeze   Atrial fibrillation with rapid ventricular response (HCC)   Bilateral carotid artery stenosis   Neutropenia (HCC)   Drug-induced neutropenia (HCC)   Normocytic anemia   Thrombocytopenia ([place])   70 y.o.female  with a history of DM, CKD, liver cirrhosis secondary to [facility], hypertension, thyroid disease presents to the ED on 11/09/2020 with multiple falls.  Patient has a chronic left foot diabetic ulcer.and is followed at [facility].  Pt presented to the ED by EMS because of multiple falls On presentation she was in Afib with RVR. AKI. Anemia with Hb of 8 .  CT head no evidence of acute findings.  MRI of the left  foot showed severe soft tissue swelling around the second metatarsal neck concerning for phlegmon/developing abscess measuring 2.1 into 1 cm.  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 She was started on vanco/cefepime and flagyl She was taken for 2 nd ray excision on 11/11/20.During that surgery purulence was noted to track to the ankle joint along the extensor tendons. A biopsy was taken from the 3rd met head I was initially consulted on 11/12/20 for the foot infection. Changed the antibiotics to zosyn because of hallucination on 11/2 she was intubated for resp distress. MRI showed Acute to early subacute infarction in the left posterior frontal cortical and subcortical brain, possibly affecting  the precentral gyrus.pt was extubated the next day- She was aphasic with rt hemiparesis. She received blood transfusion for anemia Culture from the surgical wound had MSSA, strep mitis and pasteurella and bacteroides- Zosyn changed to unasyn on 11//3/22 Heparin which was intially started for the acute stroke had to be stopped because of bleeding from the surgical wound on the left foot. On 11/22/20 I had signed off with the plan of continuing unasyn until 12/09/20 and then switching to Po augmentin till 12/23/20 for the left foot osteomyelitis. I am asked to see her again because of neutropenia Pt on 11/26/20 had melena with blood clots- eliquis was put on hold was seen by GI and had endoscopy  on 11/27/20 which  showed candida. On 11/29/20 underwent colonoscopy and a few polyps removed Pts wbc was in the lower 3s on 11/22, 11/23 and on 11/26 it was 1.6 and on 11/27 it was 1.0/ ID was consulted over weekend and drug induced neutropenia was a concern and unasyn was discontinued and was switched to vanco/cipro and flagyl Pt says she has had nausea for 3 days No fever or chills No pain abdomen  Subjective: Says she is not feeling good Appetite poor  Medications:   (feeding supplement) PROSource Plus  30 mL Oral TID BM   allopurinol  50 mg Oral QODAY   apixaban  5 mg Oral BID   vitamin C  500 mg Oral BID   atorvastatin  40 mg Oral Daily   budesonide (  PULMICORT) nebulizer solution  0.5 mg Nebulization BID   bumetanide  4 mg Oral Daily   diltiazem  180 mg Oral Daily   feeding supplement  1 Container Oral TID BM   ferrous gluconate  324 mg Oral Q breakfast   hydrALAZINE  100 mg Oral Q8H   levothyroxine  100 mcg Oral Q0600   mouth rinse  15 mL Mouth Rinse BID   metoprolol tartrate  50 mg Oral BID   multivitamin with minerals  1 tablet Oral Daily   pantoprazole  40 mg Oral BID   sodium chloride flush  3 mL Intravenous Q12H   sodium chloride flush  3 mL Intravenous Q12H   spironolactone  25 mg  Oral Daily   Tbo-Filgrastim  480 mcg Subcutaneous Daily   venlafaxine  50 mg Oral BID    Objective: Vital signs in last 24 hours: Temp:  [97.8 F (36.6 C)-98.8 F (37.1 C)] 98 F (36.7 C) (11/29 1147) Pulse Rate:  [83-104] 85 (11/29 1147) Resp:  [18-20] 18 (11/29 1147) BP: (118-138)/(73-98) 130/76 (11/29 1147) SpO2:  [93 %-100 %] 94 % (11/29 1147) Weight:  [92.2 kg] 92.2 kg (11/29 0454)  PHYSICAL EXAM:  General: Alert, cooperative, no distress,  Lips, mucosa, and tongue normal. No Thrush Neck: Supple, symmetrical, no adenopathy, thyroid: non tender no carotid bruit and no JVD. Lungs: b/l air entry Heart: rate controlled- irregular Abdomen: Soft, non-tender,not distended. Bowel sounds normal. No masses Extremities:left thumb tenderness  left foot wound On the dorsal aspect the surgical site there is dehiscence 2nd toe amputation - on the plantar aspect the small ulcerating wound is covered with wound vac 12/09/20     11/21/20   Skin: No rashes or lesions. Or bruising Lymph: Cervical, supraclavicular normal. Neurologic: rt hemiparesis  Lab Results  Recent Labs    12/08/20 0956 12/09/20 0617 12/10/20 0513 12/10/20 0933  WBC 1.0* 1.7*  --  2.0*  HGB 8.2* 8.4*  --  8.2*  HCT 25.4* 26.7*  --  25.6*  NA 139 137  --   --   K 3.6 4.6  --   --   CL 106 104  --   --   CO2 23 24  --   --   BUN 56* 53*  --   --   CREATININE 3.50* 3.57* 3.70*  --    Microbiology: 10/30 MRSA PCR: (-) 10/31 Wound (2nd metatarsal): strep mitis/oralis, pasteurella, MSSA, bacteroides 10/31 Wound (metatarsal): bacteroides, peptostreptococcus micros 10/31 Abscess (left foot): MSSA, pasteurella 11/2 Bcx Ng5d (final)    Assessment/Plan:  Neutropenia- could be drug ( antibiotic induced) she has underlying cirrhosis but has not been leucopenic before Unasyn DC-  On GCSF ANC on 12/10/20 is 100  Left foot infection- s/p 2 nd toe amputation- residual osteo Culture MSSA/Strep  mitis/Bacteroides and pasteurella- Pt was on unasyn which has been DC for neutropenia- Currently on vanco/ceftriaxone and flagyl- DC vanco and cipro and switched  to doxy to cover the aerobes on 12/09/20- Flagyl will cover the anerobe bacteroides- Will give antibiotics until 12/23/20- Once nausea resolves both the antibiotics can be converted to oral .  Esophageal candidiasis- has been adequately treated since 11/28/20- no furter fluconazole needed  Afib- controlled on diltiazem Restarted eliquis  GI bleed- seen by GI endo/colonoscopy essentially normal except for polyps- thought to be due to anticoagulation  AKI on CKD- followed by nephrologist  DM  CVA with rt hemiparesis  Anemia received multiple PRBC   Hypothyroidism- on  synthroid  NASH with cirrhosis Discussed the management with care team

## 2020-12-11 ENCOUNTER — Inpatient Hospital Stay: Payer: Medicare (Managed Care)

## 2020-12-11 DIAGNOSIS — N184 Chronic kidney disease, stage 4 (severe): Secondary | ICD-10-CM | POA: Diagnosis not present

## 2020-12-11 DIAGNOSIS — E1122 Type 2 diabetes mellitus with diabetic chronic kidney disease: Secondary | ICD-10-CM | POA: Diagnosis not present

## 2020-12-11 DIAGNOSIS — D702 Other drug-induced agranulocytosis: Secondary | ICD-10-CM | POA: Diagnosis not present

## 2020-12-11 DIAGNOSIS — I4891 Unspecified atrial fibrillation: Secondary | ICD-10-CM | POA: Diagnosis not present

## 2020-12-11 LAB — CREATININE, SERUM
Creatinine, Ser: 3.6 mg/dL — ABNORMAL HIGH (ref 0.44–1.00)
GFR, Estimated: 13 mL/min — ABNORMAL LOW (ref >60)

## 2020-12-11 NOTE — Progress Notes (Signed)
[facility]  PROGRESS NOTE   Subjective:   Patient seen resting in bed, awaiting breakfast Alert and oriented Denies pain and discomfort from left foot  Creatinine improved to 1.6 with ordered IV fluids Urine output recorded of 1.1 L in 24 hours.  Objective:  Vital signs in last 24 hours:  Temp:  [97.8 F (36.6 C)-98.9 F (37.2 C)] 98.1 F (36.7 C) (11/30 1107) Pulse Rate:  [63-107] 77 (11/30 1107) Resp:  [18-21] 20 (11/30 1107) BP: (135-150)/(77-90) 142/77 (11/30 1107) SpO2:  [90 %-97 %] 95 % (11/30 1107) Weight:  [96.3 kg] 96.3 kg (11/30 0242)  Weight change: 4.1 kg Filed Weights   12/09/20 0500 12/10/20 0454 12/11/20 0242  Weight: 95.9 kg 92.2 kg 96.3 kg    Intake/Output: I/O last 3 completed shifts: In: 2600.9 [I.V.:1452.4; IV Piggyback:1148.6] Out: 1725 [Urine:1725]   Intake/Output this shift:  No intake/output data recorded.  Physical Exam: General:  No acute distress, resting in bed  Head:  Normocephalic, atraumatic. Moist oral mucosal membranes  Lungs:   normal effort on room air, clear bilaterally  Heart:  irregular  Abdomen:   Soft, nondistended  Extremities: Trace right lower extremity peripheral edema. Gauze dressing left foot, NPWV   Neurologic:  Awake, alert, following commands  Skin:  No lesions       Basic Metabolic Panel: Recent Labs  Lab 12/08/20 0956 12/09/20 0617 12/10/20 0513 12/11/20 0556  NA 139 137  --   --   K 3.6 4.6  --   --   CL 106 104  --   --   CO2 23 24  --   --   GLUCOSE 112* 104*  --   --   BUN 56* 53*  --   --   CREATININE 3.50* 3.57* 3.70* 3.60*  CALCIUM 8.5* 8.6*  --   --   MG 2.1  --   --   --      CBC: Recent Labs  Lab 12/07/20 0610 12/08/20 0956 12/09/20 0617 12/10/20 0933 12/11/20 0556  WBC 1.5* 1.0* 1.7* 2.0* 2.8*  NEUTROABS  --  0.0* 0.1* 0.1* 0.3*  HGB 7.9* 8.2* 8.4* 8.2* 8.5*  HCT 24.5* 25.4* 26.7* 25.6* 26.9*  MCV 93.2 91.7 92.4 92.8 91.5  PLT 107* 113* 130* 122* 134*       Urinalysis: No results for input(s): COLORURINE, LABSPEC, PHURINE, GLUCOSEU, HGBUR, BILIRUBINUR, KETONESUR, PROTEINUR, UROBILINOGEN, NITRITE, LEUKOCYTESUR in the last 72 hours.  Invalid input(s): APPERANCEUR    Imaging: No results found.   Medications:    sodium chloride 10 mL/hr at 12/05/20 0604   sodium chloride Stopped (12/06/20 1004)   sodium chloride 250 mL (11/25/20 1208)   sodium chloride 50 mL/hr at 12/10/20 0956   doxycycline (VIBRAMYCIN) IV 100 mg (12/11/20 0920)   metronidazole 500 mg (12/11/20 1347)    (feeding supplement) PROSource Plus  30 mL Oral TID BM   allopurinol  50 mg Oral QODAY   apixaban  5 mg Oral BID   vitamin C  500 mg Oral BID   atorvastatin  40 mg Oral Daily   budesonide (PULMICORT) nebulizer solution  0.5 mg Nebulization BID   diltiazem  180 mg Oral Daily   feeding supplement  1 Container Oral TID BM   ferrous gluconate  324 mg Oral Q breakfast   hydrALAZINE  100 mg Oral Q8H   levothyroxine  100 mcg Oral Q0600   mouth rinse  15 mL Mouth Rinse BID   metoprolol  tartrate  50 mg Oral BID   multivitamin with minerals  1 tablet Oral Daily   pantoprazole  40 mg Oral BID   sodium chloride flush  3 mL Intravenous Q12H   sodium chloride flush  3 mL Intravenous Q12H   Tbo-Filgrastim  480 mcg Subcutaneous Daily   venlafaxine  50 mg Oral BID    Assessment/ Plan:     Principal Problem:   Sepsis ([place]) Active Problems:   Atrial fibrillation with RVR (HCC)   Osteomyelitis of foot, left, acute (HCC)   CKD stage 4 due to type 2 diabetes mellitus (HCC)   Liver cirrhosis secondary to NASH (nonalcoholic steatohepatitis) (HCC)   Hypertension   History of anemia due to CKD   Frequent falls   AKI (acute kidney injury) ([place])   Cellulitis and abscess of foot, except toes   Infectious tenosynovitis   Wheeze   Atrial fibrillation with rapid ventricular response (HCC)   Bilateral carotid artery stenosis   Neutropenia (HCC)   Drug-induced neutropenia  (HCC)   Normocytic anemia   Thrombocytopenia ([place])  Ms. Autumn [name] is a 70 y.o. white female with hypertension, diabetes mellitus type II, peripheral vascular disease, sleep apnea, atrial fibrillation who is admitted to [facility] on 11/09/2020 for Atrial fibrillation with rapid ventricular response ([place]) [I48.91] AKI (acute kidney injury) ([place]) [N17.9] Osteomyelitis of foot, left, acute ([place]) [B63.893] Atrial fibrillation with RVR ([place]) [I48.91] Worsening renal function [N28.9] Syncope, unspecified syncope type [R55]  Hospital course complicated by acute kidney injury and requiring amputation on 10/29 by Dr. Sherryle Lis. Patient with atrial fibrillation and acute CVA this admission.   #1: Acute kidney injury on chronic kidney disease stage IV with proteinuria: baseline creatinine of 3.19, GFR of 15 on 09/27/2020. Chronic kidney disease secondary to diabetes. Followed by Dr. Radene Knee, [facility]. Holding lisinopril -restarting as outpatient can be considered Acute kidney injury secondary to poor oral intake -Holding diuretics at this time -Creatinine improved to 3.6 with IV fluids.  We will continue IV fluids for another day.   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  Culture shows MSSA/strep mitis/bacteroids and Pasteurella.  Currently prescribed doxycycline and metronidazole for antibiotic therapy.  ID following   #3: Anemia with chronic kidney disease: status post PRBC transfusions. Appreciate GI input. Status post EGD and colonoscopy.  Dr. Haig Prophet on November 18, 2022Noted to have internal hemorrhoids, multiple polyps removed, diverticulosis in sigmoid and descending colon. -Hemoglobin currently 8.5.  We will start patient on EPO 20,000 units subcu weekly.   #4: Diabetes mellitus type II with chronic kidney disease: insulin dependent. Hemoglobin A1c of 6.3% on November 09, 2020.  Glucose stable    LOS: [facility] 11/30/20222:39 PM

## 2020-12-11 NOTE — Progress Notes (Signed)
Occupational Therapy Treatment Patient Details Name: Autumn [name] MRN: [id number] DOB: [birth year] Today's Date: 12/11/2020   History of present illness 70 yo F admitted 10/29 s/p falls at home, worsening left foot chronic diabetic ulcer with osteomyelitis 2nd+3rd metatarsals and cellulitis noted on MRI, s/p L foot amputation of the 2nd metatarsal and toe, I&D deep abscess multiple fascial planes, and bone biopsy open deep third metatarsal on 10/31. Other comorbidities include sepsis without shock secondary to osteomyelitis and cellulitis left foot (IV zosyn), AKI on CKD, rapid afib (Cardizem + Heparin gtt), NSTEMI suspect demand ischemia, anemia acute on chronic. Transferred to ICU and intubated 11/2 after evolving neuro changes with unresponsiveness after coughing episode. 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   OT comments  Ms Galeno was seen for OT treatment on this date. Upon arrival to room pt reclined in bed, agreeable to bed level tx. Pt continues to complain of L wrist pain - no fractures noted on xray this date. Instructed on edema mgmt and HEP. Pt completed therex as described below. Left on pillow with LUE elevated. Pt making limited progress toward goals, plan to re-evaluate next session. Pt continues to benefit from skilled OT services to maximize return to PLOF and minimize risk of future falls, injury, caregiver burden, and readmission. Will continue to follow POC. Discharge recommendation remains appropriate.     Recommendations for follow up therapy are one component of a multi-disciplinary discharge planning process, led by the attending physician.  Recommendations may be updated based on patient status, additional functional criteria and insurance authorization.    Follow Up Recommendations  Skilled nursing-short term rehab (<3 hours/day)    Assistance Recommended  at Discharge Frequent or constant Supervision/Assistance  Equipment Recommendations  Other (comment) (defer to next venue of care)    Recommendations for Other Services      Precautions / Restrictions Precautions Precautions: Fall Restrictions Weight Bearing Restrictions: No LLE Weight Bearing: Non weight bearing       Mobility Bed Mobility               General bed mobility comments: MOD A sup<>long sit                         ADL either performed or assessed with clinical judgement   ADL Overall ADL's : Needs assistance/impaired                                       General ADL Comments: SETUP self-feeding at bed level.      Cognition Arousal/Alertness: Awake/alert Behavior During Therapy: WFL for tasks assessed/performed Overall Cognitive Status: Within Functional Limits for tasks assessed Area of Impairment: Problem solving                 Orientation Level: Disoriented to;Place;Time   Memory: Decreased short-term memory Following Commands: Follows one step commands consistently Safety/Judgement: Decreased awareness of deficits;Decreased awareness of safety                Exercises General Exercises - Upper Extremity Shoulder Flexion: AROM;Strengthening;Both;5 reps;Supine Shoulder Extension: AROM;Strengthening;Both;5 reps;Supine Elbow Flexion: AROM;Strengthening;Both;5 reps;Supine Elbow Extension: AROM;Strengthening;Both;5 reps;Supine Digit Composite Flexion: AROM;Strengthening;Both;Supine;10 reps Composite Extension: AROM;Strengthening;Both;Supine;10 reps General Exercises - Lower Extremity Ankle Circles/Pumps: AROM;Strengthening;Both;Supine;10 reps Quad Sets: AROM;Strengthening;Both;Supine;10 reps Other Exercises Other Exercises: Pt  educ re: importance of mvmt for retaining functional mobility and edema mgmt           Pertinent Vitals/ Pain       Pain Assessment: Faces Faces Pain Scale: Hurts even  more Pain Location: L wrist Pain Descriptors / Indicators: Sore;Tender Pain Intervention(s): Limited activity within patient's tolerance;Repositioned   Frequency  Min 3X/week        Progress Toward Goals  OT Goals(current goals can now be found in the care plan section)  Progress towards OT goals: Progressing toward goals  Acute Rehab OT Goals OT Goal Formulation: With patient Time For Goal Achievement: 12/13/20 Potential to Achieve Goals: Good ADL Goals Pt Will Perform Grooming: sitting;with set-up Pt Will Perform Lower Body Dressing: with mod assist;sitting/lateral leans Pt Will Transfer to Toilet: bedside commode;with max assist;anterior/posterior transfer Pt/caregiver will Perform Home Exercise Program: Increased ROM;Increased strength;Both right and left upper extremity;With minimal assist  Plan Discharge plan remains appropriate;Frequency remains appropriate    Co-evaluation                 AM-PAC OT "6 Clicks" Daily Activity     Outcome Measure   Help from another person eating meals?: A Little Help from another person taking care of personal grooming?: A Little Help from another person toileting, which includes using toliet, bedpan, or urinal?: A Lot Help from another person bathing (including washing, rinsing, drying)?: A Lot Help from another person to put on and taking off regular upper body clothing?: A Little Help from another person to put on and taking off regular lower body clothing?: A Lot 6 Click Score: 15    End of Session    OT Visit Diagnosis: Other abnormalities of gait and mobility (R26.89);Hemiplegia and hemiparesis;Muscle weakness (generalized) (M62.81)   Activity Tolerance Patient limited by fatigue;Other (comment)   Patient Left in bed;with call bell/phone within reach;with bed alarm set   Nurse Communication          Time: 2482-5003 OT Time Calculation (min): 11 min  Charges: OT General Charges $OT Visit: 1 Visit OT  Treatments $Therapeutic Exercise: 8-22 mins  Dessie Coma, M.S. OTR/L  12/11/20, 3:54 PM  ascom [phone]

## 2020-12-11 NOTE — Progress Notes (Signed)
PT Cancellation Note  Patient Details Name: Autumn [name] MRN: [id number] DOB: [birth year]   Cancelled Treatment:    Reason Eval/Treat Not Completed: Fatigue/lethargy limiting ability to participate;Pain limiting ability to participate. Patient has painful left wrist to touch. RN aware. Very weak today, states she is tired. Assisted her in trying to eat cereal. Notified NT that she needs assistance to eat. Will continue to attempt to work with patient as appropriate.     Jaquaveon Bilal 12/11/2020, 1:53 PM

## 2020-12-11 NOTE — Plan of Care (Signed)
  Problem: Health Behavior/Discharge Planning: Goal: Ability to manage health-related needs will improve Outcome: Not Progressing   

## 2020-12-11 NOTE — Progress Notes (Signed)
PROGRESS NOTE    MADALEN [name]   ZOX:[id number]  DOB: November 24, 1950  PCP: Harlow Ohms, MD    DOA: 11/09/2020 LOS: 34    Brief Narrative / Hospital Course to Date:   Autumn [name] is a 70 y.o. 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  She'd been following with a [facility] podiatrist outpatient and had recently refused surgery.  She'd recently been treated with antibiotics at [facility] for the diabetic foot infection with enterobacter aerogans culture positive.  She was admitted on 10/29 due to falls at home with worsening L foot chronic diabetic ulcer with osteomyelitis of the 2nd and 3rd metatarsals and cellulitis noted on MRI.  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  Her post operative course was complicated by unresponsiveness and neurologic changes. She was found to have a stroke and was intubated for airway protection.  She was extubated 11/3 and transferred to [facility] service on 11/4.  Hospital course further complicated by acute on chronic anemia with melena after starting on anticoagulation, requiring blood transfusions and GI evaluation.    She developed severe neutropenia and this was attributed to IV Unasyn.  Antibiotics were adjusted and she was started on Neupogen.  She developed atrial fibrillation with RVR which is improved with rate control drugs (metoprolol and Cardizem).  Assessment & Plan   Principal Problem:   Sepsis ([place]) Active Problems:   Atrial fibrillation with RVR (HCC)   Osteomyelitis of foot, left, acute ([place])   CKD stage 4 due to type 2 diabetes mellitus ([place])   Liver cirrhosis secondary to NASH (nonalcoholic steatohepatitis) ([place])   Hypertension   History of anemia due to CKD   Frequent falls   AKI (acute kidney injury) ([place])   Cellulitis and abscess of foot, except toes   Infectious  tenosynovitis   Wheeze   Atrial fibrillation with rapid ventricular response (HCC)   Bilateral carotid artery stenosis   Neutropenia (HCC)   Drug-induced neutropenia (HCC)   Normocytic anemia   Thrombocytopenia (HCC)   Septic shock from left diabetic foot infection, left foot osteomyelitis and cellulitis, septic arthritis of second MTP joint: S/p amputation ray second left metatarsal, I&D deep abscess multiple fascial planes of the left foot on 11/12/2020.  Of note, surgical wound culture showed strep mitis/oralis, staff aureus, abundant Bacteroides species and rare Pasteurella multocida. --Abx: IV doxycycline and IV Flagyl.   --Follow-up ID recommendations   Severe leukopenia/neutropenia: neurtropenia is improving. Hematology consulted.   Suspect due to Abx and/or diflucan Continue Neupogen.     Atrial fibrillation with RVR, - Chest pressure has resolved. Troponin was unremarkable. Hypertension:  --Continue Cardizem, metoprolol and Eliquis.     Acute stroke with expressive aphasia and dysphagia: --Continue Lipitor and Eliquis.   --Continue dysphagia 3 diet.   Acute GI bleeding/melena on 11/26/2020: EGD on 11/28/2018 showed just an esophageal mucosa, gastritis, erythematous duodenopathy.  Colonoscopy 11/29/2020 showed multiple polyps that were removed but the colon prep was poor.  Continue Protonix.   Esophageal candidiasis: Fluconazole was discontinued on 12/09/2020 because of neutropenia.  No further treatment indicated.  Monitor.   Right hand/thumb pain and stiffness: Probably from arthritis/gout.   Uric acid was 6.8. --Continue allopurinol.   --Analgesics as needed.   --Xray left wrist today   Acute hypoxic respiratory failure, probable OSA: Continue  BiPAP at night   Acute on chronic diastolic CHF: 2D echo showed normal EF, mild LVH, severely dilated left atrium, indeterminate LV diastolic parameters.    Aspiration pneumonia: Completed treatment   Acute metabolic  encephalopathy, thrombocytopenia: Improved   Other stable comorbidities include CKD stage IV, NASH liver cirrhosis, hypothyroidism, type II DM    Obesity: Body mass index is 34.27 kg/m.  Complicates overall care and prognosis.  Recommend lifestyle modifications including physical activity and diet for weight loss and overall long-term health.   DVT prophylaxis: Place and maintain sequential compression device Start: 11/15/20 1527 apixaban (ELIQUIS) tablet 5 mg   Diet:  Diet Orders (From admission, onward)     Start     Ordered   12/11/20 1322  Diet regular Room service appropriate? Yes; Fluid consistency: Thin  Diet effective now       Question Answer Comment  Room service appropriate? Yes   Fluid consistency: Thin      12/11/20 1321              Code Status: Full Code   Subjective 12/11/20    Pt has very sore tender left wrist and 1st CMC joint today. Exquisitely tender on palpation and ROM.  No other acute complaints.  Happy to hear blood counts improving.    Disposition Plan & Communication   Status is: Inpatient  Remains inpatient appropriate because: Neutropenia requiring further monitoring for improvement.   Consults, Procedures, Significant Events   Consultants:  Gastroenterology PCCM Neurology Infectious disease Hematology   Procedures:  Intubation on 11/13/2020 EGD and colonoscopy  Arch aortogram, selective cannulation of the left common carotid artery Amputation ray second metatarsal and, I&D deep abscess multiple fascial planes, bone biopsy third metatarsal  Antimicrobials:  Anti-infectives (From admission, onward)    Start     Dose/Rate Route Frequency Ordered Stop   12/10/20 1200  vancomycin (VANCOREADY) IVPB 750 mg/150 mL  Status:  Discontinued        750 mg 150 mL/hr over 60 Minutes Intravenous Every 48 hours 12/08/20 1236 12/09/20 2017   12/10/20 1000  doxycycline (VIBRAMYCIN) 100 mg in sodium chloride 0.9 % 250 mL IVPB        100  mg 125 mL/hr over 120 Minutes Intravenous Every 12 hours 12/09/20 2017 12/23/20 2359   12/08/20 1400  metroNIDAZOLE (FLAGYL) IVPB 500 mg        500 mg 100 mL/hr over 60 Minutes Intravenous Every 12 hours 12/08/20 1231 12/23/20 2359   12/08/20 1400  ciprofloxacin (CIPRO) IVPB 400 mg  Status:  Discontinued        400 mg 200 mL/hr over 60 Minutes Intravenous Every 24 hours 12/08/20 1231 12/09/20 2017   12/08/20 1315  vancomycin (VANCOREADY) IVPB 2000 mg/400 mL        2,000 mg 200 mL/hr over 120 Minutes Intravenous  Once 12/08/20 1218 12/08/20 1506   12/05/20 1130  fluconazole (DIFLUCAN) tablet 200 mg  Status:  Discontinued        200 mg Oral Daily 12/05/20 1046 12/09/20 0725   11/30/20 1400  fluconazole (DIFLUCAN) IVPB 200 mg  Status:  Discontinued        200 mg 100 mL/hr over 60 Minutes Intravenous Every 24 hours 11/29/20 1633 12/05/20 1044   11/29/20 1800  fluconazole (DIFLUCAN) IVPB 400 mg        400 mg 100 mL/hr over 120 Minutes Intravenous  Once 11/29/20 1633 11/30/20 0724   11/29/20 1700  fluconazole (DIFLUCAN) IVPB 200  mg  Status:  Discontinued        200 mg 100 mL/hr over 60 Minutes Intravenous Every 24 hours 11/29/20 1611 11/29/20 1633   11/23/20 0000  ceFAZolin (ANCEF) IVPB 1 g/50 mL premix       Note to Pharmacy: To be given in specials   1 g 100 mL/hr over 30 Minutes Intravenous  Once 11/22/20 1106 11/22/20 1750   11/14/20 2200  Ampicillin-Sulbactam (UNASYN) 3 g in sodium chloride 0.9 % 100 mL IVPB  Status:  Discontinued        3 g 200 mL/hr over 30 Minutes Intravenous Every 12 hours 11/14/20 1610 12/08/20 1143   11/14/20 0900  piperacillin-tazobactam (ZOSYN) IVPB 2.25 g  Status:  Discontinued        2.25 g 100 mL/hr over 30 Minutes Intravenous Every 8 hours 11/14/20 0805 11/14/20 1609   11/13/20 2200  piperacillin-tazobactam (ZOSYN) IVPB 3.375 g  Status:  Discontinued        3.375 g 12.5 mL/hr over 240 Minutes Intravenous Every 12 hours 11/13/20 0758 11/14/20 0805    11/13/20 0600  piperacillin-tazobactam (ZOSYN) IVPB 2.25 g        2.25 g 100 mL/hr over 30 Minutes Intravenous Every 8 hours 11/12/20 2155 11/13/20 1725   11/11/20 1600  vancomycin (VANCOCIN) IVPB 1000 mg/200 mL premix  Status:  Discontinued        1,000 mg 200 mL/hr over 60 Minutes Intravenous Every 48 hours 11/09/20 1618 11/10/20 1045   11/11/20 1558  vancomycin (VANCOCIN) powder  Status:  Discontinued          As needed 11/11/20 1558 11/11/20 1558   11/10/20 2000  vancomycin (VANCOCIN) IVPB 1000 mg/200 mL premix  Status:  Discontinued        1,000 mg 200 mL/hr over 60 Minutes Intravenous Every 48 hours 11/10/20 1918 11/12/20 0940   11/09/20 1800  ceFEPIme (MAXIPIME) 2 g in sodium chloride 0.9 % 100 mL IVPB  Status:  Discontinued        2 g 200 mL/hr over 30 Minutes Intravenous Every 24 hours 11/09/20 1619 11/12/20 2146   11/09/20 1619  vancomycin variable dose per unstable renal function (pharmacist dosing)  Status:  Discontinued         Does not apply See admin instructions 11/09/20 1619 11/12/20 1521   11/09/20 1615  vancomycin (VANCOREADY) IVPB 1750 mg/350 mL        1,750 mg 175 mL/hr over 120 Minutes Intravenous  Once 11/09/20 1606 11/09/20 1947   11/09/20 1600  metroNIDAZOLE (FLAGYL) IVPB 500 mg  Status:  Discontinued        500 mg 100 mL/hr over 60 Minutes Intravenous Every 8 hours 11/09/20 1555 11/12/20 2146         Micro    Objective   Vitals:   12/11/20 0731 12/11/20 0746 12/11/20 1107 12/11/20 1618  BP: (!) 142/89  (!) 142/77 124/82  Pulse: (!) 107  77 84  Resp: 18  20 19   Temp: 98.9 F (37.2 C)  98.1 F (36.7 C) 98.3 F (36.8 C)  TempSrc:      SpO2: 94% 95% 95% 95%  Weight:      Height:        Intake/Output Summary (Last 24 hours) at 12/11/2020 1928 Last data filed at 12/11/2020 1742 Gross per 24 hour  Intake 3158.03 ml  Output --  Net 3158.03 ml   Filed Weights   12/09/20 0500 12/10/20 0454 12/11/20 0242  Weight: 95.9  kg 92.2 kg 96.3 kg     Physical Exam:  General exam: awake, alert, no acute distress, obese HEENT: atraumatic, clear conjunctiva, anicteric sclera, moist mucus membranes, hearing grossly normal  Respiratory system: CTAB with diminished bases, no wheezes, rales or rhonchi, normal respiratory effort. Cardiovascular system: normal S1/S2, RRR, no JVD, murmurs, rubs, gallops, trace LE edema.   Gastrointestinal system: soft, NT, ND, +bowel sounds. Central nervous system: A&O x3, stable dysarthric speech. no other gross focal neurologic deficits Skin: dry, intact, normal temperature Psychiatry: normal mood, congruent affect, judgement and insight appear normal  Labs   Data Reviewed: I have personally reviewed following labs and imaging studies  CBC: Recent Labs  Lab 12/07/20 0610 12/08/20 0956 12/09/20 0617 12/10/20 0933 12/11/20 0556  WBC 1.5* 1.0* 1.7* 2.0* 2.8*  NEUTROABS  --  0.0* 0.1* 0.1* 0.3*  HGB 7.9* 8.2* 8.4* 8.2* 8.5*  HCT 24.5* 25.4* 26.7* 25.6* 26.9*  MCV 93.2 91.7 92.4 92.8 91.5  PLT 107* 113* 130* 122* 599*   Basic Metabolic Panel: Recent Labs  Lab 12/08/20 0956 12/09/20 0617 12/10/20 0513 12/11/20 0556  NA 139 137  --   --   K 3.6 4.6  --   --   CL 106 104  --   --   CO2 23 24  --   --   GLUCOSE 112* 104*  --   --   BUN 56* 53*  --   --   CREATININE 3.50* 3.57* 3.70* 3.60*  CALCIUM 8.5* 8.6*  --   --   MG 2.1  --   --   --    GFR: Estimated Creatinine Clearance: 17.5 mL/min (A) (by C-G formula based on SCr of 3.6 mg/dL (H)). Liver Function Tests: No results for input(s): AST, ALT, ALKPHOS, BILITOT, PROT, ALBUMIN in the last 168 hours. No results for input(s): LIPASE, AMYLASE in the last 168 hours. No results for input(s): AMMONIA in the last 168 hours. Coagulation Profile: No results for input(s): INR, PROTIME in the last 168 hours. Cardiac Enzymes: No results for input(s): CKTOTAL, CKMB, CKMBINDEX, TROPONINI in the last 168 hours. BNP (last 3 results) No results for  input(s): PROBNP in the last 8760 hours. HbA1C: No results for input(s): HGBA1C in the last 72 hours. CBG: No results for input(s): GLUCAP in the last 168 hours. Lipid Profile: No results for input(s): CHOL, HDL, LDLCALC, TRIG, CHOLHDL, LDLDIRECT in the last 72 hours. Thyroid Function Tests: No results for input(s): TSH, T4TOTAL, FREET4, T3FREE, THYROIDAB in the last 72 hours. Anemia Panel: Recent Labs    12/09/20 0617  VITAMINB12 633  FOLATE 14.0  FERRITIN 37  TIBC 267  IRON 46  RETICCTPCT 4.1*   Sepsis Labs: No results for input(s): PROCALCITON, LATICACIDVEN in the last 168 hours.  Recent Results (from the past 240 hour(s))  Resp Panel by RT-PCR (Flu A&B, Covid) Nasopharyngeal Swab     Status: None   Collection Time: 12/04/20 11:07 AM   Specimen: Nasopharyngeal Swab; Nasopharyngeal(NP) swabs in vial transport medium  Result Value Ref Range Status   SARS Coronavirus 2 by RT PCR NEGATIVE NEGATIVE Final    Comment: (NOTE) SARS-CoV-2 target nucleic acids are NOT DETECTED.  The SARS-CoV-2 RNA is generally detectable in upper respiratory specimens during the acute phase of infection. The lowest concentration of SARS-CoV-2 viral copies this assay can detect is 138 copies/mL. A negative result does not preclude SARS-Cov-2 infection and should not be used as the sole basis for treatment or other  patient management decisions. A negative result may occur with  improper specimen collection/handling, submission of specimen other than nasopharyngeal swab, presence of viral mutation(s) within the areas targeted by this assay, and inadequate number of viral copies(<138 copies/mL). A negative result must be combined with clinical observations, patient history, and epidemiological information. The expected result is Negative.  Fact Sheet for Patients:  EntrepreneurPulse.com.au  Fact Sheet for Healthcare Providers:  IncredibleEmployment.be  This  test is no t yet approved or cleared by the [place] FDA and  has been authorized for detection and/or diagnosis of SARS-CoV-2 by FDA under an Emergency Use Authorization (EUA). This EUA will remain  in effect (meaning this test can be used) for the duration of the COVID-19 declaration under Section 564(b)(1) of the Act, 21 U.S.C.section 360bbb-3(b)(1), unless the authorization is terminated  or revoked sooner.       Influenza A by PCR NEGATIVE NEGATIVE Final   Influenza B by PCR NEGATIVE NEGATIVE Final    Comment: (NOTE) The Xpert Xpress SARS-CoV-2/FLU/RSV plus assay is intended as an aid in the diagnosis of influenza from Nasopharyngeal swab specimens and should not be used as a sole basis for treatment. Nasal washings and aspirates are unacceptable for Xpert Xpress SARS-CoV-2/FLU/RSV testing.  Fact Sheet for Patients: EntrepreneurPulse.com.au  Fact Sheet for Healthcare Providers: IncredibleEmployment.be  This test is not yet approved or cleared by the [place] FDA and has been authorized for detection and/or diagnosis of SARS-CoV-2 by FDA under an Emergency Use Authorization (EUA). This EUA will remain in effect (meaning this test can be used) for the duration of the COVID-19 declaration under Section 564(b)(1) of the Act, 21 U.S.C. section 360bbb-3(b)(1), unless the authorization is terminated or revoked.  Performed at [facility], [place], [place] [address]       Imaging Studies   DG Wrist 2 Views Left  Result Date: 12/11/2020 CLINICAL DATA:  Wrist pain EXAM: LEFT WRIST - 2 VIEW COMPARISON:  None. FINDINGS: No fracture or malalignment. Moderate arthritis at the first [facility] joint and STT interval. Prominent soft tissue swelling at the wrist and dorsum of the hand. IMPRESSION: 1. Prominent soft tissue swelling without acute osseous abnormality 2. Arthritis at the first [facility] joint and STT interval  Electronically Signed   By: Donavan Foil M.D.   On: 12/11/2020 15:30     Medications   Scheduled Meds:  (feeding supplement) PROSource Plus  30 mL Oral TID BM   allopurinol  50 mg Oral QODAY   apixaban  5 mg Oral BID   vitamin C  500 mg Oral BID   atorvastatin  40 mg Oral Daily   budesonide (PULMICORT) nebulizer solution  0.5 mg Nebulization BID   diltiazem  180 mg Oral Daily   feeding supplement  1 Container Oral TID BM   ferrous gluconate  324 mg Oral Q breakfast   hydrALAZINE  100 mg Oral Q8H   levothyroxine  100 mcg Oral Q0600   mouth rinse  15 mL Mouth Rinse BID   metoprolol tartrate  50 mg Oral BID   multivitamin with minerals  1 tablet Oral Daily   pantoprazole  40 mg Oral BID   sodium chloride flush  3 mL Intravenous Q12H   sodium chloride flush  3 mL Intravenous Q12H   Tbo-Filgrastim  480 mcg Subcutaneous Daily   venlafaxine  50 mg Oral BID   Continuous Infusions:  sodium chloride 10 mL/hr at 12/05/20 0604   sodium chloride  Stopped (12/06/20 1004)   sodium chloride 250 mL (11/25/20 1208)   sodium chloride 50 mL/hr at 12/11/20 1734   doxycycline (VIBRAMYCIN) IV Stopped (12/11/20 1120)   metronidazole 500 mg (12/11/20 1347)       LOS: 32 days    Time spent: 30 minutes    Ezekiel Slocumb, DO [facility]  12/11/2020, 7:28 PM      If 7PM-7AM, please contact night-coverage. How to contact the [facility] Attending or Consulting provider [place] or covering provider during after hours [place], for this patient?    Check the care team in [facility] and look for a) attending/consulting TRH provider listed and b) the [facility] team listed Log into [email or web address] and use [place]'s universal password to access. If you do not have the password, please contact the hospital operator. Locate the [facility] provider you are looking for under [facility] and page to a number that you can be directly reached. If you still have difficulty reaching the provider, please page the  [facility] (Director on Call) for the Hospitalists listed on amion for assistance.

## 2020-12-11 NOTE — Progress Notes (Signed)
Cpap removed per patient request.

## 2020-12-11 NOTE — Progress Notes (Signed)
Nutrition Follow-up  DOCUMENTATION CODES:   Obesity unspecified  INTERVENTION:   -Continue Boost Breeze po TID, each supplement provides 250 kcal and 9 grams of protein  -Continue 30 ml Prosource Plus TID, each supplement provides 100 kcals and 15 grams protein -Continue MVI with minerals daily -Liberalize diet to regular -Feeding assistance with meals  NUTRITION DIAGNOSIS:   Increased nutrient needs related to wound healing as evidenced by estimated needs.  Ongoing  GOAL:   Patient will meet greater than or equal to 90% of their needs  Progressing   MONITOR:   PO intake, Supplement acceptance, Diet advancement, Labs, Weight trends, Skin, I & O's  REASON FOR ASSESSMENT:   Consult Wound healing  ASSESSMENT:   Patient is a 70 year old female who presents to the ER for evaluation of frequent falls and is found to be septic from left foot osteomyelitis and also in rapid A. fib.  10/31- s/p  Procedure(s): AMPUTATION RAY - 2nd, bone biopsy 3rd metatarsal head 11/2- intubated 11/3- MRI of brain revealed acute lt precentral gyrus stroke, extubated 11/4- s/p BSE- advanced to dysphagia 1 diet with nectar thick liquids 11/8- s/p BSE- advanced to dysphagia 2 diet with thin liquids, NGT d/c 11/11- s/p aortogram 11/16- s/p EGD- revealed white nummular lesions in esophageal mucosa, gastritis, and erythematous duodenopathy  Reviewed I/O's: +1.1 L x 24 hours and -5.4 L since 11/27/20  UOP: 1.1 L x 24 hours  Spoke with pt at bedside, who reports her stomach pain has improved and that she is trying to eat more. Pt still does not care for the hospital food, but has been consuming the Boost Breeze supplements (approximately 2 per day). Pt is currently on a heart healthy diet, but is amenable to liberalizing diet.    Pt awaiting placement at SNF.   Medications reviewed and include vitamin C and cardizem.   Labs reviewed.   Diet Order:   Diet Order             Diet regular  Room service appropriate? Yes; Fluid consistency: Thin  Diet effective now                   EDUCATION NEEDS:   Education needs have been addressed  Skin:  Skin Assessment: Skin Integrity Issues: Skin Integrity Issues:: Wound VAC Wound Vac: lt foot Diabetic Ulcer: lt foot  Last BM:  12/05/20  Height:   Ht Readings from Last 1 Encounters:  11/09/20 5\' 6"  (1.676 m)    Weight:   Wt Readings from Last 1 Encounters:  12/11/20 96.3 kg    Ideal Body Weight:  59.1 kg  BMI:  Body mass index is 34.27 kg/m.  Estimated Nutritional Needs:   Kcal:  1800-2100kcal/day  Protein:  90-105g/day  Fluid:  1.5-1.8L/day    Loistine Chance, RD, LDN, [place] Registered Dietitian II Certified Diabetes Care and Education Specialist Please refer to AMION for RD and/or RD on-call/weekend/after hours pager

## 2020-12-11 NOTE — Progress Notes (Signed)
Late entry: 12/10/20 2130  Patient's left hand/wrist edematous and tender to touch. Patient denies any trauma to hand, however she grimaced when RN attempted to assess radial pulse. Will make attending aware and pass on to oncoming shift.

## 2020-12-11 NOTE — Progress Notes (Signed)
Speech Language Pathology Treatment: Cognitive-Linquistic  Patient Details Name: Autumn [name] MRN: [id number] DOB: May 09, 1950 Today's Date: 12/11/2020 Time: 0923-3007 SLP Time Calculation (min) (ACUTE ONLY): 20 min  Assessment / Plan / Recommendation Clinical Impression  Pt seen this date for follow up dysarthria and cognitive communication intervention targeting application of compensatory strategy for intelligibility and memory. Moderate verbal support and reference to sign on wall facilitated identification of speech intelligibility strategies. Following review, pt applied strategies to oral reading and narration task with min verbal cues to increase volume and slow speech to achieve approximately 90% intelligibility. Memory targeted with incorporation of external aids available in room to recall relevant names, numbers, and precautions with min verbal cues data identification to achieve 100% acc. Repetition aided immediate and working memory for stroke education (signs of stroke), with pt then applying information to personal experience with min-mod cues. Pt reporting potential benefit of repetition to aid recall and processing for new/complex information. Recommend cues for slowed speech as needed and use of repetition/rephrasing and intermittent prompts to ensure encoding information   HPI HPI: 70 yo F with history of T2DM, CKD, 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  She'd been following with a [facility] podiatrist outpatient and had recently refused surgery.  She'd recently been treated with antibiotics at [facility] for the diabetic foot infection with enterobacter aerogans culture positive.  She was admitted on 10/29 due to falls at home with worsening L foot chronic diabetic ulcer with osteomyelitis of the 2nd and 3rd metatarsals and cellulitis noted on MRI.  She's now s/p 2nd ray amputation and I&D of the left foot with bone  biopsy of the L 3rd metatarsal.  Her post operative course was complicated by unresponsiveness and neurologic changes on 11/13/2021. She was found to have a stroke (MRI Brain showed acute to early subacute infarction in the left posterior frontal cortical and subcortical brain, possibly affecting the precentral gyrus) and was intubated for airway protection.  She was extubated 11/3 and transferred to [facility] service on 11/4.      SLP Plan  Continue with current plan of care      Recommendations for follow up therapy are one component of a multi-disciplinary discharge planning process, led by the attending physician.  Recommendations may be updated based on patient status, additional functional criteria and insurance authorization.    Recommendations                Oral Care Recommendations: Oral care BID;Oral care before and after PO;Staff/trained caregiver to provide oral care Follow Up Recommendations: Skilled nursing-short term rehab (<3 hours/day) Assistance recommended at discharge: Intermittent Supervision/Assistance SLP Visit Diagnosis: Dysarthria and anarthria (R47.1);Cognitive communication deficit (R41.841) Plan: Continue with current plan of care       GO              Autumn [name], M.S., CCC-SLP Speech-Language Pathologist [place] [facility]  Autumn C Aundrea Horace  12/11/2020, 11:30 AM

## 2020-12-12 DIAGNOSIS — D702 Other drug-induced agranulocytosis: Secondary | ICD-10-CM | POA: Diagnosis not present

## 2020-12-12 DIAGNOSIS — R6520 Severe sepsis without septic shock: Secondary | ICD-10-CM | POA: Diagnosis not present

## 2020-12-12 DIAGNOSIS — E1122 Type 2 diabetes mellitus with diabetic chronic kidney disease: Secondary | ICD-10-CM | POA: Diagnosis not present

## 2020-12-12 DIAGNOSIS — A419 Sepsis, unspecified organism: Secondary | ICD-10-CM | POA: Diagnosis not present

## 2020-12-12 LAB — CBC WITH DIFFERENTIAL/PLATELET
Abs Immature Granulocytes: 0.03 10*3/uL (ref 0.00–0.07)
Abs Immature Granulocytes: 0.1 10*3/uL — ABNORMAL HIGH (ref 0.00–0.07)
Band Neutrophils: 2 %
Basophils Absolute: 0 10*3/uL (ref 0.0–0.1)
Basophils Absolute: 0.2 10*3/uL — ABNORMAL HIGH (ref 0.0–0.1)
Basophils Relative: 1 %
Basophils Relative: 2 %
Eosinophils Absolute: 0 10*3/uL (ref 0.0–0.5)
Eosinophils Absolute: 0.1 10*3/uL (ref 0.0–0.5)
Eosinophils Relative: 1 %
Eosinophils Relative: 1 %
HCT: 26.9 % — ABNORMAL LOW (ref 36.0–46.0)
HCT: 25.9 % — ABNORMAL LOW (ref 36.0–46.0)
Hemoglobin: 8.5 g/dL — ABNORMAL LOW (ref 12.0–15.0)
Hemoglobin: 8.1 g/dL — ABNORMAL LOW (ref 12.0–15.0)
Immature Granulocytes: 1 %
Lymphocytes Relative: 23 %
Lymphocytes Relative: 24 %
Lymphs Abs: 0.6 10*3/uL — ABNORMAL LOW (ref 0.7–4.0)
Lymphs Abs: 1.8 10*3/uL (ref 0.7–4.0)
MCH: 28.9 pg (ref 26.0–34.0)
MCH: 28.8 pg (ref 26.0–34.0)
MCHC: 31.6 g/dL (ref 30.0–36.0)
MCHC: 31.3 g/dL (ref 30.0–36.0)
MCV: 91.5 fL (ref 80.0–100.0)
MCV: 92.2 fL (ref 80.0–100.0)
Monocytes Absolute: 1.7 10*3/uL — ABNORMAL HIGH (ref 0.1–1.0)
Monocytes Absolute: 4.6 10*3/uL — ABNORMAL HIGH (ref 0.1–1.0)
Monocytes Relative: 63 %
Monocytes Relative: 60 %
Neutro Abs: 0.3 10*3/uL — CL (ref 1.7–7.7)
Neutro Abs: 0.9 10*3/uL — ABNORMAL LOW (ref 1.7–7.7)
Neutrophils Relative %: 11 %
Neutrophils Relative %: 10 %
Platelets: 134 10*3/uL — ABNORMAL LOW (ref 150–400)
Platelets: 127 10*3/uL — ABNORMAL LOW (ref 150–400)
Promyelocytes Relative: 1 %
RBC: 2.94 MIL/uL — ABNORMAL LOW (ref 3.87–5.11)
RBC: 2.81 MIL/uL — ABNORMAL LOW (ref 3.87–5.11)
RDW: 17.1 % — ABNORMAL HIGH (ref 11.5–15.5)
RDW: 17.1 % — ABNORMAL HIGH (ref 11.5–15.5)
Smear Review: NORMAL
WBC: 2.8 10*3/uL — ABNORMAL LOW (ref 4.0–10.5)
WBC: 7.6 10*3/uL (ref 4.0–10.5)
nRBC: 1.1 % — ABNORMAL HIGH (ref 0.0–0.2)
nRBC: 0.8 % — ABNORMAL HIGH (ref 0.0–0.2)

## 2020-12-12 LAB — BASIC METABOLIC PANEL
Anion gap: 8 (ref 5–15)
BUN: 54 mg/dL — ABNORMAL HIGH (ref 8–23)
CO2: 22 mmol/L (ref 22–32)
Calcium: 8.6 mg/dL — ABNORMAL LOW (ref 8.9–10.3)
Chloride: 104 mmol/L (ref 98–111)
Creatinine, Ser: 3.74 mg/dL — ABNORMAL HIGH (ref 0.44–1.00)
GFR, Estimated: 13 mL/min — ABNORMAL LOW (ref >60)
Glucose, Bld: 107 mg/dL — ABNORMAL HIGH (ref 70–99)
Potassium: 4.3 mmol/L (ref 3.5–5.1)
Sodium: 134 mmol/L — ABNORMAL LOW (ref 135–145)

## 2020-12-12 LAB — HEMOGLOBIN AND HEMATOCRIT, BLOOD
HCT: 26.1 % — ABNORMAL LOW (ref 36.0–46.0)
Hemoglobin: 8.1 g/dL — ABNORMAL LOW (ref 12.0–15.0)

## 2020-12-12 MED ORDER — ALPRAZOLAM 0.25 MG PO TABS
0.2500 mg | ORAL_TABLET | Freq: Two times a day (BID) | ORAL | Status: DC | PRN
  Administered 2020-12-12: 0.25 mg via ORAL
  Filled 2020-12-12: qty 1

## 2020-12-12 MED ORDER — EPOETIN ALFA 40000 UNIT/ML IJ SOLN
20000.0000 [IU] | INTRAMUSCULAR | Status: DC
  Administered 2020-12-12 – 2020-12-19 (×2): 20000 [IU] via SUBCUTANEOUS
  Filled 2020-12-12 (×2): qty 1

## 2020-12-12 NOTE — Progress Notes (Signed)
[facility]  PROGRESS NOTE   Subjective:   Patient resting quietly No complaints at this time  Creatinine slightly worse at 3.7   Objective:  Vital signs in last 24 hours:  Temp:  [97.4 F (36.3 C)-98.7 F (37.1 C)] 98.7 F (37.1 C) (12/01 1154) Pulse Rate:  [80-129] 129 (12/01 1154) Resp:  [16-20] 20 (12/01 1154) BP: (124-141)/(68-92) 135/87 (12/01 1154) SpO2:  [94 %-99 %] 97 % (12/01 1154)  Weight change:  Filed Weights   12/09/20 0500 12/10/20 0454 12/11/20 0242  Weight: 95.9 kg 92.2 kg 96.3 kg    Intake/Output: I/O last 3 completed shifts: In: 3158 [I.V.:1819.5; IV Piggyback:1338.6] Out: -    Intake/Output this shift:  Total I/O In: 250 [P.O.:250] Out: -   Physical Exam: General:  No acute distress, resting in bed  Head:  Normocephalic, atraumatic. Moist oral mucosal membranes  Lungs:   normal effort on room air, clear bilaterally  Heart:  irregular  Abdomen:   Soft, nondistended  Extremities: Trace right lower extremity peripheral edema. Gauze dressing left foot, NPWV   Neurologic:  Awake, alert, following commands  Skin:  No lesions       Basic Metabolic Panel: Recent Labs  Lab 12/08/20 0956 12/09/20 0617 12/10/20 0513 12/11/20 0556 12/12/20 0434  NA 139 137  --   --  134*  K 3.6 4.6  --   --  4.3  CL 106 104  --   --  104  CO2 23 24  --   --  22  GLUCOSE 112* 104*  --   --  107*  BUN 56* 53*  --   --  54*  CREATININE 3.50* 3.57* 3.70* 3.60* 3.74*  CALCIUM 8.5* 8.6*  --   --  8.6*  MG 2.1  --   --   --   --      CBC: Recent Labs  Lab 12/08/20 0956 12/09/20 0617 12/10/20 0933 12/11/20 0556 12/12/20 0434  WBC 1.0* 1.7* 2.0* 2.8* 7.6  NEUTROABS 0.0* 0.1* 0.1* 0.3* 0.9*  HGB 8.2* 8.4* 8.2* 8.5* 8.1*  HCT 25.4* 26.7* 25.6* 26.9* 25.9*  MCV 91.7 92.4 92.8 91.5 92.2  PLT 113* 130* 122* 134* 127*      Urinalysis: No results for input(s): COLORURINE, LABSPEC, PHURINE, GLUCOSEU, HGBUR, BILIRUBINUR, KETONESUR,  PROTEINUR, UROBILINOGEN, NITRITE, LEUKOCYTESUR in the last 72 hours.  Invalid input(s): APPERANCEUR    Imaging: DG Wrist 2 Views Left  Result Date: 12/11/2020 CLINICAL DATA:  Wrist pain EXAM: LEFT WRIST - 2 VIEW COMPARISON:  None. FINDINGS: No fracture or malalignment. Moderate arthritis at the first [facility] joint and STT interval. Prominent soft tissue swelling at the wrist and dorsum of the hand. IMPRESSION: 1. Prominent soft tissue swelling without acute osseous abnormality 2. Arthritis at the first [facility] joint and STT interval Electronically Signed   By: Donavan Foil M.D.   On: 12/11/2020 15:30     Medications:    sodium chloride 10 mL/hr at 12/05/20 0604   sodium chloride Stopped (12/06/20 1004)   sodium chloride 250 mL (11/25/20 1208)   sodium chloride 50 mL/hr at 12/11/20 1734   doxycycline (VIBRAMYCIN) IV 100 mg (12/12/20 0941)   metronidazole 500 mg (12/12/20 0305)    (feeding supplement) PROSource Plus  30 mL Oral TID BM   allopurinol  50 mg Oral QODAY   apixaban  5 mg Oral BID   vitamin C  500 mg Oral BID   atorvastatin  40 mg Oral  Daily   budesonide (PULMICORT) nebulizer solution  0.5 mg Nebulization BID   diltiazem  180 mg Oral Daily   epoetin (EPOGEN/PROCRIT) injection  20,000 Units Subcutaneous Weekly   feeding supplement  1 Container Oral TID BM   ferrous gluconate  324 mg Oral Q breakfast   hydrALAZINE  100 mg Oral Q8H   levothyroxine  100 mcg Oral Q0600   mouth rinse  15 mL Mouth Rinse BID   metoprolol tartrate  50 mg Oral BID   multivitamin with minerals  1 tablet Oral Daily   pantoprazole  40 mg Oral BID   sodium chloride flush  3 mL Intravenous Q12H   sodium chloride flush  3 mL Intravenous Q12H   venlafaxine  50 mg Oral BID    Assessment/ Plan:     Principal Problem:   Sepsis ([place]) Active Problems:   Atrial fibrillation with RVR (HCC)   Osteomyelitis of foot, left, acute (HCC)   CKD stage 4 due to type 2 diabetes mellitus (HCC)   Liver cirrhosis  secondary to NASH (nonalcoholic steatohepatitis) (HCC)   Hypertension   History of anemia due to CKD   Frequent falls   AKI (acute kidney injury) ([place])   Cellulitis and abscess of foot, except toes   Infectious tenosynovitis   Wheeze   Atrial fibrillation with rapid ventricular response (HCC)   Bilateral carotid artery stenosis   Neutropenia (HCC)   Drug-induced neutropenia (HCC)   Normocytic anemia   Thrombocytopenia (HCC)  Ms. KEYERA HATTABAUGH is a 70 y.o. white female with hypertension, diabetes mellitus type II, peripheral vascular disease, sleep apnea, atrial fibrillation who is admitted to [facility] on 11/09/2020 for Atrial fibrillation with rapid ventricular response ([place]) [I48.91] AKI (acute kidney injury) ([place]) [N17.9] Osteomyelitis of foot, left, acute ([place]) [D66.440] Atrial fibrillation with RVR ([place]) [I48.91] Worsening renal function [N28.9] Syncope, unspecified syncope type [R55]  Hospital course complicated by acute kidney injury and requiring amputation on 10/29 by Dr. Sherryle Lis. Patient with atrial fibrillation and acute CVA this admission.   #1: Acute kidney injury on chronic kidney disease stage IV with proteinuria: baseline creatinine of 3.19, GFR of 15 on 09/27/2020. Chronic kidney disease secondary to diabetes. Followed by Dr. Radene Knee, [facility]. Holding lisinopril -restarting as outpatient can be considered Acute kidney injury secondary to poor oral intake -Holding diuretics at this time -Creatinine slightly worse at 3.7. Will continue IVF and patient encouraged to increase oral intake.   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  Culture shows MSSA/strep mitis/bacteroids and Pasteurella.  Receiving doxycycline and metronidazole for antibiotic therapy.  ID following   #3: Anemia with chronic kidney disease: status post PRBC transfusions. Appreciate GI input. Status post EGD and colonoscopy.  Dr. Haig Prophet on November 29, 2020. Noted to have  internal hemorrhoids, multiple polyps removed, diverticulosis in sigmoid and descending colon. -Hemoglobin currently 8.1.  EPO 20,000 units subcu weekly ordered today.   #4: Diabetes mellitus type II with chronic kidney disease: insulin dependent. Hemoglobin A1c of 6.3% on November 09, 2020.      LOS: [facility] 12/1/20221:47 PM

## 2020-12-12 NOTE — Progress Notes (Signed)
Date of Admission:  11/09/2020     ID: Autumn [name] is a 70 y.o. female  Principal Problem:   Sepsis ([place]) Active Problems:   Atrial fibrillation with RVR ([place])   Osteomyelitis of foot, left, acute ([place])   CKD stage 4 due to type 2 diabetes mellitus ([place])   Liver cirrhosis secondary to NASH (nonalcoholic steatohepatitis) ([place])   Hypertension   History of anemia due to CKD   Frequent falls   AKI (acute kidney injury) ([place])   Cellulitis and abscess of foot, except toes   Infectious tenosynovitis   Wheeze   Atrial fibrillation with rapid ventricular response (HCC)   Bilateral carotid artery stenosis   Neutropenia (HCC)   Drug-induced neutropenia (HCC)   Normocytic anemia   Thrombocytopenia ([place])   70 y.o.female  with a history of DM, CKD, liver cirrhosis secondary to [facility], hypertension, thyroid disease presents to the ED on 11/09/2020 with multiple falls.  Patient has a chronic left foot diabetic ulcer.and is followed at [facility].  Pt presented to the ED by EMS because of multiple falls On presentation she was in Afib with RVR. AKI. Anemia with Hb of 8 .  CT head no evidence of acute findings.  MRI of the left  foot showed severe soft tissue swelling around the second metatarsal neck concerning for phlegmon/developing abscess measuring 2.1 into 1 cm.  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 She was started on vanco/cefepime and flagyl She was taken for 2 nd ray excision on 11/11/20.During that surgery purulence was noted to track to the ankle joint along the extensor tendons. A biopsy was taken from the 3rd met head I was initially consulted on 11/12/20 for the foot infection. Changed the antibiotics to zosyn because of hallucination on 11/2 she was intubated for resp distress. MRI showed Acute to early subacute infarction in the left posterior frontal cortical and subcortical brain, possibly affecting  the precentral gyrus.pt was extubated the next day- She was aphasic with rt hemiparesis. She received blood transfusion for anemia Culture from the surgical wound had MSSA, strep mitis and pasteurella and bacteroides- Zosyn changed to unasyn on 11//3/22 Heparin which was intially started for the acute stroke had to be stopped because of bleeding from the surgical wound on the left foot. On 11/22/20 I had signed off with the plan of continuing unasyn until 12/09/20 and then switching to Po augmentin till 12/23/20 for the left foot osteomyelitis. I am asked to see her again because of neutropenia Pt on 11/26/20 had melena with blood clots- eliquis was put on hold was seen by GI and had endoscopy  on 11/27/20 which  showed candida. On 11/29/20 underwent colonoscopy and a few polyps removed Pts wbc was in the lower 3s on 11/22, 11/23 and on 11/26 it was 1.6 and on 11/27 it was 1.0/ ID was consulted over weekend and drug induced neutropenia was a concern and unasyn was discontinued and was switched to vanco/cipro and flagyl Pt says she has had nausea for 3 days No fever or chills No pain abdomen  Subjective: Patient is more alert. Talking Feeling better  Medications:   (feeding supplement) PROSource Plus  30 mL Oral TID BM   allopurinol  50 mg Oral QODAY   vitamin C  500 mg Oral BID   atorvastatin  40 mg Oral Daily   budesonide (PULMICORT) nebulizer solution  0.5 mg Nebulization BID  diltiazem  180 mg Oral Daily   epoetin (EPOGEN/PROCRIT) injection  20,000 Units Subcutaneous Weekly   feeding supplement  1 Container Oral TID BM   ferrous gluconate  324 mg Oral Q breakfast   hydrALAZINE  100 mg Oral Q8H   levothyroxine  100 mcg Oral Q0600   mouth rinse  15 mL Mouth Rinse BID   metoprolol tartrate  50 mg Oral BID   multivitamin with minerals  1 tablet Oral Daily   pantoprazole  40 mg Oral BID   sodium chloride flush  3 mL Intravenous Q12H   sodium chloride flush  3 mL Intravenous Q12H    venlafaxine  50 mg Oral BID    Objective: Vital signs in last 24 hours: Temp:  [97.4 F (36.3 C)-98.7 F (37.1 C)] 97.6 F (36.4 C) (12/01 1954) Pulse Rate:  [80-129] 84 (12/01 1954) Resp:  [16-20] 18 (12/01 1954) BP: (119-141)/(68-92) 129/82 (12/01 1954) SpO2:  [92 %-97 %] 95 % (12/01 1954)  PHYSICAL EXAM:  General: Alert, cooperative, no distress,  On the dorsal aspect the surgical site there is dehiscence 2nd toe amputation - on the plantar aspect the small ulcerating wound is covered with wound vac 12/12/20   Fibrinous material in the wound     Skin: No rashes or lesions. Or bruising Lymph: Cervical, supraclavicular normal. Neurologic: rt hemiparesis  Recent Labs    12/11/20 0556 12/12/20 0434 12/12/20 1732  WBC 2.8* 7.6  --   HGB 8.5* 8.1* 8.1*  HCT 26.9* 25.9* 26.1*  NA  --  134*  --   K  --  4.3  --   CL  --  104  --   CO2  --  22  --   BUN  --  54*  --   CREATININE 3.60* 3.74*  --    Microbiology: 10/30 MRSA PCR: (-) 10/31 Wound (2nd metatarsal): strep mitis/oralis, pasteurella, MSSA, bacteroides 10/31 Wound (metatarsal): bacteroides, peptostreptococcus micros 10/31 Abscess (left foot): MSSA, pasteurella 11/2 Bcx Ng5d (final)    Assessment/Plan:  Neutropenia-related to ampicillin Discontinued and with G-CSF it is resolved  Left foot infection- s/p 2 nd toe amputation- residual osteo Culture MSSA/Strep mitis/Bacteroides and pasteurella- Pt was on unasyn which has been DC for neutropenia- Currently on doxy to cover the aerobes and- Flagyl will cover the anerobe bacteroides- Will give antibiotics until 12/23/20- Once nausea resolves both the antibiotics can be converted to oral . Recommend wound care consult/podiatry to evaluate the wound for the fibrinous coating.  Esophageal candidiasis- has been adequately treated since 11/28/20- no furter fluconazole needed  Afib- controlled on diltiazem Restarted eliquis  GI bleed- seen by GI  endo/colonoscopy essentially normal except for polyps- thought to be due to anticoagulation  AKI on CKD- followed by nephrologist  DM  CVA with rt hemiparesis  Anemia received multiple PRBC   Hypothyroidism- on synthroid  NASH with cirrhosis Discussed the management with care team  ID will sign off.  Call if needed

## 2020-12-12 NOTE — Progress Notes (Addendum)
PROGRESS NOTE    Autumn [name]   HDQ:[id number]  DOB: 11-14-1950  PCP: Harlow Ohms, MD    DOA: 11/09/2020 LOS: 62    Brief Narrative / Hospital Course to Date:   PINKI ROTTMAN is a 70 y.o. 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  She'd been following with a [facility] podiatrist outpatient and had recently refused surgery.  She'd recently been treated with antibiotics at [facility] for the diabetic foot infection with enterobacter aerogans culture positive.  She was admitted on 10/29 due to falls at home with worsening L foot chronic diabetic ulcer with osteomyelitis of the 2nd and 3rd metatarsals and cellulitis noted on MRI.  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  Her post operative course was complicated by unresponsiveness and neurologic changes. She was found to have a stroke and was intubated for airway protection.  She was extubated 11/3 and transferred to [facility] service on 11/4.  Hospital course further complicated by acute on chronic anemia with melena after starting on anticoagulation, requiring blood transfusions and GI evaluation.    She developed severe neutropenia and this was attributed to IV Unasyn.  Antibiotics were adjusted and she was started on Neupogen.  She developed atrial fibrillation with RVR which is improved with rate control drugs (metoprolol and Cardizem).  Assessment & Plan   Principal Problem:   Sepsis ([place]) Active Problems:   Atrial fibrillation with RVR (HCC)   Osteomyelitis of foot, left, acute ([place])   CKD stage 4 due to type 2 diabetes mellitus ([place])   Liver cirrhosis secondary to NASH (nonalcoholic steatohepatitis) ([place])   Hypertension   History of anemia due to CKD   Frequent falls   AKI (acute kidney injury) ([place])   Cellulitis and abscess of foot, except toes   Infectious  tenosynovitis   Wheeze   Atrial fibrillation with rapid ventricular response (HCC)   Bilateral carotid artery stenosis   Neutropenia (HCC)   Drug-induced neutropenia (HCC)   Normocytic anemia   Thrombocytopenia (HCC)   Septic shock from left diabetic foot infection, left foot osteomyelitis and cellulitis, septic arthritis of second MTP joint: S/p amputation ray second left metatarsal, I&D deep abscess multiple fascial planes of the left foot on 11/12/2020.  Of note, surgical wound culture showed strep mitis/oralis, staff aureus, abundant Bacteroides species and rare Pasteurella multocida. --Abx: IV doxycycline and IV Flagyl.   --Follow-up ID recommendations --Podiatry to reassess and update wound recommendations Wound vac with no output recently.   Severe leukopenia/neutropenia: neurtropenia is improving. Hematology consulted.   Suspect due to Abx and/or diflucan Completed 5 days Granix on 12/1.    Recurrent Melena - in 12/1 afternoon, Autumn [name] noted to have black stools.  GI bleeding earlier this admission and GI evaluation as outlined. --Hold Eliquis --Recheck Hbg this afternoon --Monitor closely.   --Bleeding scan if active bleeding.   Atrial fibrillation with RVR, - HR's controlled. Had chest pressure during episode, resolved.  Troponin was unremarkable. --Rate control with cardizem and metoprolol --Hold Eliquis for recurrent melena   Hypertension:  --Continue Cardizem, metoprolol and Eliquis.     Acute stroke with expressive aphasia and dysphagia: --Continue Lipitor and Eliquis.   --Continue dysphagia 3 diet.   Acute GI bleeding/melena on 11/26/2020: EGD on 11/28/2018 showed just an esophageal mucosa, gastritis, erythematous duodenopathy.  Colonoscopy 11/29/2020 showed multiple polyps that were removed but the colon prep was poor.  Continue Protonix.   Esophageal candidiasis: Fluconazole was discontinued on 12/09/2020 because of neutropenia.  No further treatment indicated.   Monitor.   Right hand/thumb pain and stiffness: Probably from arthritis/gout.   Uric acid was 6.8. --Continue allopurinol.   --Analgesics as needed.   --Xray left wrist today   Acute hypoxic respiratory failure, probable OSA: Continue BiPAP at night   Acute on chronic diastolic CHF: 2D echo showed normal EF, mild LVH, severely dilated left atrium, indeterminate LV diastolic parameters.    Aspiration pneumonia: Completed treatment   Acute metabolic encephalopathy, thrombocytopenia: Improved  CKD stage IV - Nephrology following. Now with Cr slightly worsening in setting of poor PO intake. On gentle IV hydration. Monitor BMP.   Other stable comorbidities include CKD stage IV, NASH liver cirrhosis, hypothyroidism, type II DM    Obesity: Body mass index is 34.27 kg/m.  Complicates overall care and prognosis.  Recommend lifestyle modifications including physical activity and diet for weight loss and overall long-term health.   DVT prophylaxis: Place and maintain sequential compression device Start: 11/15/20 1527   Diet:  Diet Orders (From admission, onward)     Start     Ordered   12/11/20 1322  Diet regular Room service appropriate? Yes; Fluid consistency: Thin  Diet effective now       Question Answer Comment  Room service appropriate? Yes   Fluid consistency: Thin      12/11/20 1321              Code Status: Full Code   Subjective 12/12/20    Autumn [name] reports nausea earlier this AM, no vomiting.  Feeling better at this time.  Reported having had a BM, needs cleaning up.  No pain in her foot, no abdominal pain.  No other acute complaints    Disposition Plan & Communication   Status is: Inpatient  Remains inpatient appropriate because: Neutropenia requiring further monitoring for improvement.  Now with recurrent melena.   Consults, Procedures, Significant Events   Consultants:  Gastroenterology PCCM Neurology Infectious disease Hematology   Procedures:   Intubation on 11/13/2020 EGD and colonoscopy  Arch aortogram, selective cannulation of the left common carotid artery Amputation ray second metatarsal and, I&D deep abscess multiple fascial planes, bone biopsy third metatarsal  Antimicrobials:  Anti-infectives (From admission, onward)    Start     Dose/Rate Route Frequency Ordered Stop   12/10/20 1200  vancomycin (VANCOREADY) IVPB 750 mg/150 mL  Status:  Discontinued        750 mg 150 mL/hr over 60 Minutes Intravenous Every 48 hours 12/08/20 1236 12/09/20 2017   12/10/20 1000  doxycycline (VIBRAMYCIN) 100 mg in sodium chloride 0.9 % 250 mL IVPB        100 mg 125 mL/hr over 120 Minutes Intravenous Every 12 hours 12/09/20 2017 12/23/20 2359   12/08/20 1400  metroNIDAZOLE (FLAGYL) IVPB 500 mg        500 mg 100 mL/hr over 60 Minutes Intravenous Every 12 hours 12/08/20 1231 12/23/20 2359   12/08/20 1400  ciprofloxacin (CIPRO) IVPB 400 mg  Status:  Discontinued        400 mg 200 mL/hr over 60 Minutes Intravenous Every 24 hours 12/08/20 1231 12/09/20 2017   12/08/20 1315  vancomycin (VANCOREADY) IVPB 2000 mg/400 mL        2,000 mg 200 mL/hr over 120 Minutes Intravenous  Once 12/08/20 1218 12/08/20 1506  12/05/20 1130  fluconazole (DIFLUCAN) tablet 200 mg  Status:  Discontinued        200 mg Oral Daily 12/05/20 1046 12/09/20 0725   11/30/20 1400  fluconazole (DIFLUCAN) IVPB 200 mg  Status:  Discontinued        200 mg 100 mL/hr over 60 Minutes Intravenous Every 24 hours 11/29/20 1633 12/05/20 1044   11/29/20 1800  fluconazole (DIFLUCAN) IVPB 400 mg        400 mg 100 mL/hr over 120 Minutes Intravenous  Once 11/29/20 1633 11/30/20 0724   11/29/20 1700  fluconazole (DIFLUCAN) IVPB 200 mg  Status:  Discontinued        200 mg 100 mL/hr over 60 Minutes Intravenous Every 24 hours 11/29/20 1611 11/29/20 1633   11/23/20 0000  ceFAZolin (ANCEF) IVPB 1 g/50 mL premix       Note to Pharmacy: To be given in specials   1 g 100 mL/hr over 30 Minutes  Intravenous  Once 11/22/20 1106 11/22/20 1750   11/14/20 2200  Ampicillin-Sulbactam (UNASYN) 3 g in sodium chloride 0.9 % 100 mL IVPB  Status:  Discontinued        3 g 200 mL/hr over 30 Minutes Intravenous Every 12 hours 11/14/20 1610 12/08/20 1143   11/14/20 0900  piperacillin-tazobactam (ZOSYN) IVPB 2.25 g  Status:  Discontinued        2.25 g 100 mL/hr over 30 Minutes Intravenous Every 8 hours 11/14/20 0805 11/14/20 1609   11/13/20 2200  piperacillin-tazobactam (ZOSYN) IVPB 3.375 g  Status:  Discontinued        3.375 g 12.5 mL/hr over 240 Minutes Intravenous Every 12 hours 11/13/20 0758 11/14/20 0805   11/13/20 0600  piperacillin-tazobactam (ZOSYN) IVPB 2.25 g        2.25 g 100 mL/hr over 30 Minutes Intravenous Every 8 hours 11/12/20 2155 11/13/20 1725   11/11/20 1600  vancomycin (VANCOCIN) IVPB 1000 mg/200 mL premix  Status:  Discontinued        1,000 mg 200 mL/hr over 60 Minutes Intravenous Every 48 hours 11/09/20 1618 11/10/20 1045   11/11/20 1558  vancomycin (VANCOCIN) powder  Status:  Discontinued          As needed 11/11/20 1558 11/11/20 1558   11/10/20 2000  vancomycin (VANCOCIN) IVPB 1000 mg/200 mL premix  Status:  Discontinued        1,000 mg 200 mL/hr over 60 Minutes Intravenous Every 48 hours 11/10/20 1918 11/12/20 0940   11/09/20 1800  ceFEPIme (MAXIPIME) 2 g in sodium chloride 0.9 % 100 mL IVPB  Status:  Discontinued        2 g 200 mL/hr over 30 Minutes Intravenous Every 24 hours 11/09/20 1619 11/12/20 2146   11/09/20 1619  vancomycin variable dose per unstable renal function (pharmacist dosing)  Status:  Discontinued         Does not apply See admin instructions 11/09/20 1619 11/12/20 1521   11/09/20 1615  vancomycin (VANCOREADY) IVPB 1750 mg/350 mL        1,750 mg 175 mL/hr over 120 Minutes Intravenous  Once 11/09/20 1606 11/09/20 1947   11/09/20 1600  metroNIDAZOLE (FLAGYL) IVPB 500 mg  Status:  Discontinued        500 mg 100 mL/hr over 60 Minutes Intravenous Every 8  hours 11/09/20 1555 11/12/20 2146         Micro    Objective   Vitals:   12/12/20 1546 12/12/20 1634 12/12/20 1932 12/12/20 1954  BP:  Marland Kitchen)  119/92  129/82  Pulse:  95  84  Resp:  20  18  Temp:  98.7 F (37.1 C)  97.6 F (36.4 C)  TempSrc:      SpO2: 92% 96% 93% 95%  Weight:      Height:        Intake/Output Summary (Last 24 hours) at 12/12/2020 2015 Last data filed at 12/12/2020 1100 Gross per 24 hour  Intake 250 ml  Output --  Net 250 ml   Filed Weights   12/09/20 0500 12/10/20 0454 12/11/20 0242  Weight: 95.9 kg 92.2 kg 96.3 kg    Physical Exam:  General exam: awake, alert, no acute distress, obese Respiratory system: lungs clear, normal respiratory effort. Cardiovascular system: normal S1/S2, RRR, no JVD, murmurs, rubs, gallops, trace LE edema.   Gastrointestinal system: soft, NT, ND, +bowel sounds. Central nervous system: A&O x3, stable dysarthric speech. no other gross focal neurologic deficits Psychiatry: normal mood, congruent affect, judgement and insight appear normal  Labs   Data Reviewed: I have personally reviewed following labs and imaging studies  CBC: Recent Labs  Lab 12/08/20 0956 12/09/20 0617 12/10/20 0933 12/11/20 0556 12/12/20 0434 12/12/20 1732  WBC 1.0* 1.7* 2.0* 2.8* 7.6  --   NEUTROABS 0.0* 0.1* 0.1* 0.3* 0.9*  --   HGB 8.2* 8.4* 8.2* 8.5* 8.1* 8.1*  HCT 25.4* 26.7* 25.6* 26.9* 25.9* 26.1*  MCV 91.7 92.4 92.8 91.5 92.2  --   PLT 113* 130* 122* 134* 127*  --    Basic Metabolic Panel: Recent Labs  Lab 12/08/20 0956 12/09/20 0617 12/10/20 0513 12/11/20 0556 12/12/20 0434  NA 139 137  --   --  134*  K 3.6 4.6  --   --  4.3  CL 106 104  --   --  104  CO2 23 24  --   --  22  GLUCOSE 112* 104*  --   --  107*  BUN 56* 53*  --   --  54*  CREATININE 3.50* 3.57* 3.70* 3.60* 3.74*  CALCIUM 8.5* 8.6*  --   --  8.6*  MG 2.1  --   --   --   --    GFR: Estimated Creatinine Clearance: 16.8 mL/min (A) (by C-G formula based on SCr  of 3.74 mg/dL (H)). Liver Function Tests: No results for input(s): AST, ALT, ALKPHOS, BILITOT, PROT, ALBUMIN in the last 168 hours. No results for input(s): LIPASE, AMYLASE in the last 168 hours. No results for input(s): AMMONIA in the last 168 hours. Coagulation Profile: No results for input(s): INR, PROTIME in the last 168 hours. Cardiac Enzymes: No results for input(s): CKTOTAL, CKMB, CKMBINDEX, TROPONINI in the last 168 hours. BNP (last 3 results) No results for input(s): PROBNP in the last 8760 hours. HbA1C: No results for input(s): HGBA1C in the last 72 hours. CBG: No results for input(s): GLUCAP in the last 168 hours. Lipid Profile: No results for input(s): CHOL, HDL, LDLCALC, TRIG, CHOLHDL, LDLDIRECT in the last 72 hours. Thyroid Function Tests: No results for input(s): TSH, T4TOTAL, FREET4, T3FREE, THYROIDAB in the last 72 hours. Anemia Panel: No results for input(s): VITAMINB12, FOLATE, FERRITIN, TIBC, IRON, RETICCTPCT in the last 72 hours.  Sepsis Labs: No results for input(s): PROCALCITON, LATICACIDVEN in the last 168 hours.  Recent Results (from the past 240 hour(s))  Resp Panel by RT-PCR (Flu A&B, Covid) Nasopharyngeal Swab     Status: None   Collection Time: 12/04/20 11:07 AM   Specimen: Nasopharyngeal  Swab; Nasopharyngeal(NP) swabs in vial transport medium  Result Value Ref Range Status   SARS Coronavirus 2 by RT PCR NEGATIVE NEGATIVE Final    Comment: (NOTE) SARS-CoV-2 target nucleic acids are NOT DETECTED.  The SARS-CoV-2 RNA is generally detectable in upper respiratory specimens during the acute phase of infection. The lowest concentration of SARS-CoV-2 viral copies this assay can detect is 138 copies/mL. A negative result does not preclude SARS-Cov-2 infection and should not be used as the sole basis for treatment or other patient management decisions. A negative result may occur with  improper specimen collection/handling, submission of specimen  other than nasopharyngeal swab, presence of viral mutation(s) within the areas targeted by this assay, and inadequate number of viral copies(<138 copies/mL). A negative result must be combined with clinical observations, patient history, and epidemiological information. The expected result is Negative.  Fact Sheet for Patients:  EntrepreneurPulse.com.au  Fact Sheet for Healthcare Providers:  IncredibleEmployment.be  This test is no t yet approved or cleared by the [place] FDA and  has been authorized for detection and/or diagnosis of SARS-CoV-2 by FDA under an Emergency Use Authorization (EUA). This EUA will remain  in effect (meaning this test can be used) for the duration of the COVID-19 declaration under Section 564(b)(1) of the Act, 21 U.S.C.section 360bbb-3(b)(1), unless the authorization is terminated  or revoked sooner.       Influenza A by PCR NEGATIVE NEGATIVE Final   Influenza B by PCR NEGATIVE NEGATIVE Final    Comment: (NOTE) The Xpert Xpress SARS-CoV-2/FLU/RSV plus assay is intended as an aid in the diagnosis of influenza from Nasopharyngeal swab specimens and should not be used as a sole basis for treatment. Nasal washings and aspirates are unacceptable for Xpert Xpress SARS-CoV-2/FLU/RSV testing.  Fact Sheet for Patients: EntrepreneurPulse.com.au  Fact Sheet for Healthcare Providers: IncredibleEmployment.be  This test is not yet approved or cleared by the [place] FDA and has been authorized for detection and/or diagnosis of SARS-CoV-2 by FDA under an Emergency Use Authorization (EUA). This EUA will remain in effect (meaning this test can be used) for the duration of the COVID-19 declaration under Section 564(b)(1) of the Act, 21 U.S.C. section 360bbb-3(b)(1), unless the authorization is terminated or revoked.  Performed at [facility], [place], [place] [address]       Imaging Studies   DG Wrist 2 Views Left  Result Date: 12/11/2020 CLINICAL DATA:  Wrist pain EXAM: LEFT WRIST - 2 VIEW COMPARISON:  None. FINDINGS: No fracture or malalignment. Moderate arthritis at the first [facility] joint and STT interval. Prominent soft tissue swelling at the wrist and dorsum of the hand. IMPRESSION: 1. Prominent soft tissue swelling without acute osseous abnormality 2. Arthritis at the first [facility] joint and STT interval Electronically Signed   By: Donavan Foil M.D.   On: 12/11/2020 15:30     Medications   Scheduled Meds:  (feeding supplement) PROSource Plus  30 mL Oral TID BM   allopurinol  50 mg Oral QODAY   vitamin C  500 mg Oral BID   atorvastatin  40 mg Oral Daily   budesonide (PULMICORT) nebulizer solution  0.5 mg Nebulization BID   diltiazem  180 mg Oral Daily   epoetin (EPOGEN/PROCRIT) injection  20,000 Units Subcutaneous Weekly   feeding supplement  1 Container Oral TID BM   ferrous gluconate  324 mg Oral Q breakfast   hydrALAZINE  100 mg Oral Q8H   levothyroxine  100 mcg Oral  Q0600   mouth rinse  15 mL Mouth Rinse BID   metoprolol tartrate  50 mg Oral BID   multivitamin with minerals  1 tablet Oral Daily   pantoprazole  40 mg Oral BID   sodium chloride flush  3 mL Intravenous Q12H   sodium chloride flush  3 mL Intravenous Q12H   venlafaxine  50 mg Oral BID   Continuous Infusions:  sodium chloride 10 mL/hr at 12/05/20 0604   sodium chloride Stopped (12/06/20 1004)   sodium chloride 250 mL (11/25/20 1208)   sodium chloride 50 mL/hr at 12/11/20 1734   doxycycline (VIBRAMYCIN) IV 100 mg (12/12/20 0941)   metronidazole 500 mg (12/12/20 1538)       LOS: 33 days    Time spent: 30 minutes with > 50% spent at bedside and in coordination of care     Ezekiel Slocumb, DO [facility]  12/12/2020, 8:15 PM      If 7PM-7AM, please contact night-coverage. How to contact the [facility] Attending or Consulting provider [place] or covering provider during after hours [place], for this patient?    Check the care team in [facility] and look for a) attending/consulting TRH provider listed and b) the [facility] team listed Log into [email or web address] and use [place]'s universal password to access. If you do not have the password, please contact the hospital operator. Locate the [facility] provider you are looking for under [facility] and page to a number that you can be directly reached. If you still have difficulty reaching the provider, please page the [facility] (Director on Call) for the Hospitalists listed on amion for assistance.

## 2020-12-12 NOTE — Progress Notes (Signed)
Occupational Therapy Treatment Patient Details Name: Autumn [name] MRN: [id number] DOB: Nov 18, 1950 Today's Date: 12/12/2020   History of present illness 70 yo F admitted 10/29 s/p falls at home, worsening left foot chronic diabetic ulcer with osteomyelitis 2nd+3rd metatarsals and cellulitis noted on MRI, s/p L foot amputation of the 2nd metatarsal and toe, I&D deep abscess multiple fascial planes, and bone biopsy open deep third metatarsal on 10/31. Other comorbidities include sepsis without shock secondary to osteomyelitis and cellulitis left foot (IV zosyn), AKI on CKD, rapid afib (Cardizem + Heparin gtt), NSTEMI suspect demand ischemia, anemia acute on chronic. Transferred to ICU and intubated 11/2 after evolving neuro changes with unresponsiveness after coughing episode. 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   OT comments  Ms Garczynski seen for OT/PT co-treatment on this date. Upon arrival to room pt reclined in bed, noted to have had BM> RN notified of dark, sticky BM and in to assess. Pt requires  MAX A toileting at bed level. MAX A for LB access in sidelying. SETUP self-drinking at bed level. Goals remain appropriate, frequency decreased to reflect pt's decreased activity tolerance. Pt continues to benefit from skilled OT services to maximize return to PLOF and minimize risk of future falls, injury, caregiver burden, and readmission. Will continue to follow POC. Discharge recommendation remains appropriate.     Recommendations for follow up therapy are one component of a multi-disciplinary discharge planning process, led by the attending physician.  Recommendations may be updated based on patient status, additional functional criteria and insurance authorization.    Follow Up Recommendations  Skilled nursing-short term rehab (<3 hours/day)    Assistance Recommended at Discharge  Frequent or constant Supervision/Assistance  Equipment Recommendations  Other (comment) (defer to next venue of care)    Recommendations for Other Services      Precautions / Restrictions Precautions Precautions: Fall Restrictions Weight Bearing Restrictions: Yes LLE Weight Bearing: Non weight bearing Other Position/Activity Restrictions: wound vac       Mobility Bed Mobility Overal bed mobility: Needs Assistance Bed Mobility: Rolling Rolling: Mod assist;+2 for physical assistance         General bed mobility comments: patient found to be soiled in bed, required mod assist for rolling to get cleaned up. Reporting sob with this activity. saturations > 90%    Transfers                   General transfer comment: not attempted this date. Patient fatigued with rolling in bed to get cleaned up and sheets changed.         ADL either performed or assessed with clinical judgement   ADL Overall ADL's : Needs assistance/impaired                                       General ADL Comments: MAX A toileting at bed level. MAX A for LB access in sidelying. SETUP self-drinking at bed level.      Cognition Arousal/Alertness: Awake/alert Behavior During Therapy: WFL for tasks assessed/performed Overall Cognitive Status: Within Functional Limits for tasks assessed Area of Impairment: Problem solving                 Orientation Level: Disoriented to;Place;Time   Memory: Decreased short-term memory Following Commands: Follows one step commands consistently Safety/Judgement: Decreased awareness of  deficits;Decreased awareness of safety Awareness: Intellectual;Emergent Problem Solving: Requires verbal cues            Exercises Other Exercises Other Exercises: Pt educated re: importance of mobility Other Exercises: toileting, rolling, sidelying tolerance           Pertinent Vitals/ Pain       Pain Assessment: No/denies pain          Frequency  Min 2X/week        Progress Toward Goals  OT Goals(current goals can now be found in the care plan section)  Progress towards OT goals: Progressing toward goals  Acute Rehab OT Goals OT Goal Formulation: With patient Time For Goal Achievement: 12/26/20 Potential to Achieve Goals: Good ADL Goals Pt Will Perform Grooming: sitting;with set-up Pt Will Perform Lower Body Dressing: with mod assist;sitting/lateral leans Pt Will Transfer to Toilet: squat pivot transfer;bedside commode;with max assist Pt/caregiver will Perform Home Exercise Program: Increased ROM;Increased strength;Both right and left upper extremity;With minimal assist  Plan Discharge plan remains appropriate;Frequency needs to be updated    Co-evaluation                 AM-PAC OT "6 Clicks" Daily Activity     Outcome Measure   Help from another person eating meals?: A Little Help from another person taking care of personal grooming?: A Little Help from another person toileting, which includes using toliet, bedpan, or urinal?: A Lot Help from another person bathing (including washing, rinsing, drying)?: A Lot Help from another person to put on and taking off regular upper body clothing?: A Little Help from another person to put on and taking off regular lower body clothing?: A Lot 6 Click Score: 15    End of Session    OT Visit Diagnosis: Other abnormalities of gait and mobility (R26.89);Hemiplegia and hemiparesis;Muscle weakness (generalized) (M62.81)   Activity Tolerance Patient limited by fatigue   Patient Left in bed;with call bell/phone within reach;with bed alarm set   Nurse Communication Mobility status        Time: 1610-9604 OT Time Calculation (min): 23 min  Charges: OT General Charges $OT Visit: 1 Visit OT Treatments $Self Care/Home Management : 8-22 mins  Dessie Coma, M.S. OTR/L  12/12/20, 4:30 PM  ascom [phone]

## 2020-12-12 NOTE — Progress Notes (Signed)
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 Other comorbidities include sepsis without shock secondary to osteomyelitis and cellulitis left foot (IV zosyn), AKI on CKD, rapid afib (Cardizem + Heparin gtt), NSTEMI suspect demand ischemia, anemia acute on chronic. Transferred to ICU and intubated 11/2 after evolving neuro changes with unresponsiveness after coughing episode. 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    PT Comments    Patient received in bed, patient weak appearing, slurred speech, reluctant to participate in therapy. She was assisted to roll onto side and found to be soiled and bed soiled. (Black stool, RN notified)  Assisted with patient rolling and clean up/sheets changed. Patient fatigued after this and unable to do more. Patient will continue to benefit from skilled PT while here to improve functional independence and strength.      Recommendations for follow up therapy are one component of a multi-disciplinary discharge planning process, led by the attending physician.  Recommendations may be updated based on patient status, additional functional criteria and insurance authorization.  Follow Up Recommendations  Skilled nursing-short term rehab (<3 hours/day)     Assistance Recommended at Discharge Frequent or constant Supervision/Assistance  Equipment Recommendations       Recommendations for Other Services       Precautions / Restrictions  Precautions Precautions: Fall Restrictions Weight Bearing Restrictions: Yes LLE Weight Bearing: Non weight bearing Other Position/Activity Restrictions: wound vac     Mobility  Bed Mobility Overal bed mobility: Needs Assistance Bed Mobility: Rolling Rolling: Mod assist;+2 for physical assistance         General bed mobility comments: patient found to be soiled in bed, required mod assist for rolling to get cleaned up. Reporting sob with this activity. saturations > 90%    Transfers                   General transfer comment: not attempted this date. Patient fatigued with rolling in bed to get cleaned up and sheets changed.    Ambulation/Gait               General Gait Details: unable to at this time   Stairs             Wheelchair Mobility    Modified Rankin (Stroke Patients Only)       Balance                                            Cognition Arousal/Alertness: Awake/alert Behavior During Therapy: WFL for tasks assessed/performed Overall Cognitive Status: Within Functional Limits for tasks assessed Area of Impairment: Problem solving                 Orientation Level: Disoriented to;Place;Time   Memory: Decreased short-term memory Following Commands: Follows one step commands consistently Safety/Judgement: Decreased awareness of deficits;Decreased awareness of safety Awareness: Intellectual;Emergent Problem Solving: Requires verbal cues  Exercises      General Comments        Pertinent Vitals/Pain Pain Assessment: No/denies pain    Home Living                          Prior Function            PT Goals (current goals can now be found in the care plan section) Acute Rehab PT Goals Patient Stated Goal: to go to rehab PT Goal Formulation: With patient Time For Goal Achievement: 12/26/20 Potential to Achieve Goals: Fair Additional Goals Additional Goal #1: Pt will  maintain NWB precautions without physical assist in order to allow full healing of L foot Progress towards PT goals: Not progressing toward goals - comment (patient seems to be getting weaker with prolonged hospitalization)    Frequency    Min 2X/week      PT Plan Current plan remains appropriate    Co-evaluation              AM-PAC PT "6 Clicks" Mobility   Outcome Measure  Help needed turning from your back to your side while in a flat bed without using bedrails?: A Lot Help needed moving from lying on your back to sitting on the side of a flat bed without using bedrails?: A Lot Help needed moving to and from a bed to a chair (including a wheelchair)?: Total Help needed standing up from a chair using your arms (e.g., wheelchair or bedside chair)?: Total Help needed to walk in hospital room?: Total Help needed climbing 3-5 steps with a railing? : Total 6 Click Score: 8    End of Session   Activity Tolerance: Patient limited by fatigue Patient left: in bed;with bed alarm set;with call bell/phone within reach Nurse Communication: Mobility status PT Visit Diagnosis: Other abnormalities of gait and mobility (R26.89);Hemiplegia and hemiparesis;Muscle weakness (generalized) (M62.81);History of falling (Z91.81) Hemiplegia - Right/Left: Right Hemiplegia - dominant/non-dominant: Dominant Hemiplegia - caused by: Cerebral infarction     Time: 9163-8466 PT Time Calculation (min) (ACUTE ONLY): 27 min  Charges:  $Therapeutic Activity: 8-22 mins                     Latham Kinzler, PT, GCS 12/12/20,3:52 PM

## 2020-12-13 DIAGNOSIS — M86172 Other acute osteomyelitis, left ankle and foot: Secondary | ICD-10-CM | POA: Diagnosis not present

## 2020-12-13 DIAGNOSIS — E1122 Type 2 diabetes mellitus with diabetic chronic kidney disease: Secondary | ICD-10-CM | POA: Diagnosis not present

## 2020-12-13 DIAGNOSIS — A419 Sepsis, unspecified organism: Secondary | ICD-10-CM | POA: Diagnosis not present

## 2020-12-13 DIAGNOSIS — I10 Essential (primary) hypertension: Secondary | ICD-10-CM | POA: Diagnosis not present

## 2020-12-13 LAB — CBC WITH DIFFERENTIAL/PLATELET
Abs Immature Granulocytes: 0.92 10*3/uL — ABNORMAL HIGH (ref 0.00–0.07)
Basophils Absolute: 0.1 10*3/uL (ref 0.0–0.1)
Basophils Relative: 0 %
Eosinophils Absolute: 0.1 10*3/uL (ref 0.0–0.5)
Eosinophils Relative: 1 %
HCT: 26.4 % — ABNORMAL LOW (ref 36.0–46.0)
Hemoglobin: 8.2 g/dL — ABNORMAL LOW (ref 12.0–15.0)
Immature Granulocytes: 4 %
Lymphocytes Relative: 11 %
Lymphs Abs: 2.3 10*3/uL (ref 0.7–4.0)
MCH: 28.9 pg (ref 26.0–34.0)
MCHC: 31.1 g/dL (ref 30.0–36.0)
MCV: 93 fL (ref 80.0–100.0)
Monocytes Absolute: 3.8 10*3/uL — ABNORMAL HIGH (ref 0.1–1.0)
Monocytes Relative: 18 %
Neutro Abs: 13.9 10*3/uL — ABNORMAL HIGH (ref 1.7–7.7)
Neutrophils Relative %: 66 %
Platelets: 147 10*3/uL — ABNORMAL LOW (ref 150–400)
RBC: 2.84 MIL/uL — ABNORMAL LOW (ref 3.87–5.11)
RDW: 17.2 % — ABNORMAL HIGH (ref 11.5–15.5)
WBC: 21.1 10*3/uL — ABNORMAL HIGH (ref 4.0–10.5)
nRBC: 0.5 % — ABNORMAL HIGH (ref 0.0–0.2)

## 2020-12-13 LAB — COMPREHENSIVE METABOLIC PANEL
ALT: 11 U/L (ref 0–44)
AST: 23 U/L (ref 15–41)
Albumin: 2.4 g/dL — ABNORMAL LOW (ref 3.5–5.0)
Alkaline Phosphatase: 138 U/L — ABNORMAL HIGH (ref 38–126)
Anion gap: 9 (ref 5–15)
BUN: 55 mg/dL — ABNORMAL HIGH (ref 8–23)
CO2: 21 mmol/L — ABNORMAL LOW (ref 22–32)
Calcium: 8.7 mg/dL — ABNORMAL LOW (ref 8.9–10.3)
Chloride: 107 mmol/L (ref 98–111)
Creatinine, Ser: 3.83 mg/dL — ABNORMAL HIGH (ref 0.44–1.00)
GFR, Estimated: 12 mL/min — ABNORMAL LOW (ref >60)
Glucose, Bld: 88 mg/dL (ref 70–99)
Potassium: 4 mmol/L (ref 3.5–5.1)
Sodium: 137 mmol/L (ref 135–145)
Total Bilirubin: 0.6 mg/dL (ref 0.3–1.2)
Total Protein: 5.4 g/dL — ABNORMAL LOW (ref 6.5–8.1)

## 2020-12-13 LAB — MAGNESIUM: Magnesium: 2 mg/dL (ref 1.7–2.4)

## 2020-12-13 NOTE — Progress Notes (Signed)
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 Other comorbidities include sepsis without shock secondary to osteomyelitis and cellulitis left foot (IV zosyn), AKI on CKD, rapid afib (Cardizem + Heparin gtt), NSTEMI suspect demand ischemia, anemia acute on chronic. Transferred to ICU and intubated 11/2 after evolving neuro changes with unresponsiveness after coughing episode. 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    PT Comments    Pt was long sitting in bed eating banana upon arriving. [place] was still on banana and author assisted pt with opening. She is alert but had cognition deficits present. Was able to correctly state why she was in hospital however unable to correctly state what hospital she was at. She was not aware of NWB. She did progress form supine to short sit with +1 assistance. Increased time and vcs throughout for full participation however author questions pt's effort throughout. Acute PT recommends DC to SNF to address deficits while maximizing independence with ADLs.   Recommendations for follow up therapy are one component of a multi-disciplinary discharge planning process, led by the attending physician.  Recommendations may be updated based on patient status, additional functional criteria and insurance authorization.  Follow Up Recommendations  Skilled nursing-short term rehab (<3 hours/day)     Assistance Recommended at Discharge Frequent or constant  Supervision/Assistance  Equipment Recommendations  Other (comment) (defer to next level of care)       Precautions / Restrictions Precautions Precautions: Fall Restrictions Weight Bearing Restrictions: Yes LLE Weight Bearing: Non weight bearing     Mobility  Bed Mobility Overal bed mobility: Needs Assistance Bed Mobility: Rolling Rolling: Mod assist;Max assist   Supine to sit: Mod assist;HOB elevated Sit to supine: Min assist;Mod assist   General bed mobility comments: Pt was able to progress to EOB with mod assist, required less assistance to return to supine after EOB activity.    Transfers    General transfer comment: Pt was unwilling to try OOB activity and with max encouragement    Ambulation/Gait      General Gait Details: unwilling and unsafe due to wt bearing restrictions      Balance Overall balance assessment: Needs assistance Sitting-balance support: Feet supported Sitting balance-Leahy Scale: Fair Sitting balance - Comments: ptsat EOB x ~ 15 minutes htroughout session. unwilling to go to recliner but did perform several exercises EOB             Cognition Arousal/Alertness: Awake/alert Behavior During Therapy: WFL for tasks assessed/performed Overall Cognitive Status: Within Functional Limits for tasks assessed Area of Impairment: Orientation;Safety/judgement;Awareness;Problem solving        Orientation Level: Disoriented to;Place;Time      General Comments: Pt is alert however does have severe cognition deficits. Needs alot of encouragement to participate and author questions if pt gives maximal effort throughout        Exercises General Exercises - Lower Extremity Ankle Circles/Pumps: AROM;Strengthening;Both;Supine;10 reps Quad Sets: AROM;Strengthening;Both;Supine;10 reps Gluteal Sets: AROM;10 reps Heel Slides: Right;10 reps Hip ABduction/ADduction: AAROM;Strengthening;Both;15 reps;Supine Straight Leg Raises: AROM;10 reps  Pertinent Vitals/Pain Pain Assessment: No/denies pain Pain Score: 0-No pain     PT Goals (current goals can now be found in the care plan section) Acute Rehab PT Goals Patient Stated Goal: to go to rehab Progress towards PT goals: Progressing toward goals    Frequency    Min 2X/week      PT Plan Current plan remains appropriate    Co-evaluation     PT goals addressed during session: Mobility/safety with mobility;Balance;Proper use of DME;Strengthening/ROM        AM-PAC PT "6 Clicks" Mobility   Outcome Measure  Help needed turning from your back to your side while in a flat bed without using bedrails?: A Lot Help needed moving from lying on your back to sitting on the side of a flat bed without using bedrails?: A Lot Help needed moving to and from a bed to a chair (including a wheelchair)?: Total Help needed standing up from a chair using your arms (e.g., wheelchair or bedside chair)?: Total Help needed to walk in hospital room?: Total Help needed climbing 3-5 steps with a railing? : Total 6 Click Score: 8    End of Session Equipment Utilized During Treatment: Gait belt Activity Tolerance: Patient limited by fatigue Patient left: in bed;with bed alarm set;with call bell/phone within reach Nurse Communication: Mobility status PT Visit Diagnosis: Other abnormalities of gait and mobility (R26.89);Hemiplegia and hemiparesis;Muscle weakness (generalized) (M62.81);History of falling (Z91.81) Hemiplegia - Right/Left: Right Hemiplegia - dominant/non-dominant: Dominant Hemiplegia - caused by: Cerebral infarction     Time: 1572-6203 PT Time Calculation (min) (ACUTE ONLY): 19 min  Charges:  $Therapeutic Activity: 8-22 mins                     Julaine Fusi PTA 12/13/20, 4:33 PM

## 2020-12-13 NOTE — Progress Notes (Addendum)
While working with therapy, patient had an episode of dark stool.  Will continue to monitor

## 2020-12-13 NOTE — Progress Notes (Signed)
Patient started experiencing worsening SOB and CP.  On assessment: patient's pain was worse with palpation, lung sounds clear but diminished and patient seemed very anxious.     Paged MD and gave patient a breathing treatment.   MD ordered low dose of xanax

## 2020-12-13 NOTE — Progress Notes (Signed)
   PODIATRY PROGRESS NOTE  NAME ARIEAL CUOCO MRN [id number] DOB 13-May-1950 DOA 11/09/2020   CC: Diabetic foot infection LT Chief Complaint  Patient presents with   Fall     History of present illness: 70 y.o. female patient postop day 32 s/p Incision and drainage. 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 LT. DOS: 11/11/2020.  Doing much better than she was when I last saw her 2 weeks ago, the plantar wound is nearly fully healed and there is very little VAC output  Past Medical History:  Diagnosis Date   Diabetes mellitus without complication ([place])    Hypertension    Kidney stone    Thyroid disease     CBC Latest Ref Rng & Units 12/13/2020 12/12/2020 12/12/2020  WBC 4.0 - 10.5 K/uL 21.1(H) - 7.6  Hemoglobin 12.0 - 15.0 g/dL 8.2(L) 8.1(L) 8.1(L)  Hematocrit 36.0 - 46.0 % 26.4(L) 26.1(L) 25.9(L)  Platelets 150 - 400 K/uL 147(L) - 127(L)    BMP Latest Ref Rng & Units 12/13/2020 12/12/2020 12/11/2020  Glucose 70 - 99 mg/dL 88 107(H) -  BUN 8 - 23 mg/dL 55(H) 54(H) -  Creatinine 0.44 - 1.00 mg/dL 3.83(H) 3.74(H) 3.60(H)  Sodium 135 - 145 mmol/L 137 134(L) -  Potassium 3.5 - 5.1 mmol/L 4.0 4.3 -  Chloride 98 - 111 mmol/L 107 104 -  CO2 22 - 32 mmol/L 21(L) 22 -  Calcium 8.9 - 10.3 mg/dL 8.7(L) 8.6(L) -         Physical Exam:   Sutures intact dorsal.  Exposed tendon proximally. No purulence.  No malodor.  Minimal drainage.  No active bleeding.  No strikethrough.  Wound VAC on operating.  Plantar ulceration is nearly completely healed    ASSESSMENT/PLAN OF CARE 1.  S/P second ray amputation. I&D LT foot. DOS: 11/11/2020 - Doing much better the plantar ulceration is nearly completely healed she can begin WB on the foot in a postop shoe which I have ordered, should help her stroke rehab is well to be able to WB on the foot -Saline wet-to-dry dressings daily for now -She is going to be on antibiotics through 12/12 per ID -Likely will restart VAC on the dorsal incision in 1-2 week I will remove  sutures and possibly debride at bedside at that time   Lanae Crumbly, DPM 12/13/2020     [address], [place] [address]                Office [phone]  Fax [phone]

## 2020-12-13 NOTE — TOC Progression Note (Signed)
Transition of Care [facility]) - Progression Note    Patient Details  Name: Autumn [name] MRN: [id number] Date of Birth: 12-24-1950  Transition of Care [facility]) CM/SW [employer], [place] Phone Number: 12/13/2020, 10:15 AM  Clinical Narrative:     M[facility]el Corning will re start auth today for insurance as patient has been not medically ready to dc.   [facility] reports they are also still working on getting Bipap at facility.    Expected Discharge Plan: [place] Barriers to Discharge: Continued Medical Work up  Expected Discharge Plan and Services Expected Discharge Plan: [place] arrangements for the past 2 months: Single Family Home                                       Social Determinants of Health (SDOH) Interventions    Readmission Risk Interventions No flowsheet data found.

## 2020-12-13 NOTE — Progress Notes (Signed)
Patient had 2 more episodes of dark stools.  MD made aware  Will hold the Eliquis for now and check a hemoglobin

## 2020-12-13 NOTE — Progress Notes (Signed)
[facility]  PROGRESS NOTE   Subjective:   Patient drowsy this morning Breakfast tray at bedside  Creatinine 3.82  Objective:  Vital signs in last 24 hours:  Temp:  [97.6 F (36.4 C)-98.8 F (37.1 C)] 97.9 F (36.6 C) (12/02 1141) Pulse Rate:  [84-106] 98 (12/02 1141) Resp:  [18-20] 19 (12/02 1141) BP: (119-140)/(82-94) 132/82 (12/02 1141) SpO2:  [92 %-97 %] 97 % (12/02 1141)  Weight change:  Filed Weights   12/09/20 0500 12/10/20 0454 12/11/20 0242  Weight: 95.9 kg 92.2 kg 96.3 kg    Intake/Output: I/O last 3 completed shifts: In: 830 [P.O.:250; I.V.:380; IV Piggyback:200] Out: -    Intake/Output this shift:  Total I/O In: 480 [P.O.:480] Out: 600 [Urine:600]  Physical Exam: General:  No acute distress, resting in bed  Head:  Normocephalic, atraumatic. Moist oral mucosal membranes  Lungs:   normal effort on room air, clear bilaterally  Heart:  irregular  Abdomen:   Soft, nondistended  Extremities: Trace right lower extremity peripheral edema. Gauze dressing left foot, NPWV   Neurologic:  Awake, alert, following commands  Skin:  No lesions       Basic Metabolic Panel: Recent Labs  Lab 12/08/20 0956 12/09/20 0617 12/10/20 0513 12/11/20 0556 12/12/20 0434 12/13/20 0536  NA 139 137  --   --  134* 137  K 3.6 4.6  --   --  4.3 4.0  CL 106 104  --   --  104 107  CO2 23 24  --   --  22 21*  GLUCOSE 112* 104*  --   --  107* 88  BUN 56* 53*  --   --  54* 55*  CREATININE 3.50* 3.57* 3.70* 3.60* 3.74* 3.83*  CALCIUM 8.5* 8.6*  --   --  8.6* 8.7*  MG 2.1  --   --   --   --  2.0     CBC: Recent Labs  Lab 12/09/20 0617 12/10/20 0933 12/11/20 0556 12/12/20 0434 12/12/20 1732 12/13/20 0536  WBC 1.7* 2.0* 2.8* 7.6  --  21.1*  NEUTROABS 0.1* 0.1* 0.3* 0.9*  --  13.9*  HGB 8.4* 8.2* 8.5* 8.1* 8.1* 8.2*  HCT 26.7* 25.6* 26.9* 25.9* 26.1* 26.4*  MCV 92.4 92.8 91.5 92.2  --  93.0  PLT 130* 122* 134* 127*  --  147*      Urinalysis: No  results for input(s): COLORURINE, LABSPEC, PHURINE, GLUCOSEU, HGBUR, BILIRUBINUR, KETONESUR, PROTEINUR, UROBILINOGEN, NITRITE, LEUKOCYTESUR in the last 72 hours.  Invalid input(s): APPERANCEUR    Imaging: DG Wrist 2 Views Left  Result Date: 12/11/2020 CLINICAL DATA:  Wrist pain EXAM: LEFT WRIST - 2 VIEW COMPARISON:  None. FINDINGS: No fracture or malalignment. Moderate arthritis at the first [facility] joint and STT interval. Prominent soft tissue swelling at the wrist and dorsum of the hand. IMPRESSION: 1. Prominent soft tissue swelling without acute osseous abnormality 2. Arthritis at the first [facility] joint and STT interval Electronically Signed   By: Donavan Foil M.D.   On: 12/11/2020 15:30     Medications:    sodium chloride 10 mL/hr at 12/05/20 0604   sodium chloride Stopped (12/06/20 1004)   sodium chloride 250 mL (11/25/20 1208)   sodium chloride 50 mL/hr at 12/11/20 1734   doxycycline (VIBRAMYCIN) IV 100 mg (12/13/20 0910)   metronidazole 500 mg (12/13/20 1340)    (feeding supplement) PROSource Plus  30 mL Oral TID BM   allopurinol  50 mg Oral QODAY  vitamin C  500 mg Oral BID   atorvastatin  40 mg Oral Daily   budesonide (PULMICORT) nebulizer solution  0.5 mg Nebulization BID   diltiazem  180 mg Oral Daily   epoetin (EPOGEN/PROCRIT) injection  20,000 Units Subcutaneous Weekly   feeding supplement  1 Container Oral TID BM   ferrous gluconate  324 mg Oral Q breakfast   hydrALAZINE  100 mg Oral Q8H   levothyroxine  100 mcg Oral Q0600   mouth rinse  15 mL Mouth Rinse BID   metoprolol tartrate  50 mg Oral BID   multivitamin with minerals  1 tablet Oral Daily   pantoprazole  40 mg Oral BID   sodium chloride flush  3 mL Intravenous Q12H   sodium chloride flush  3 mL Intravenous Q12H   venlafaxine  50 mg Oral BID    Assessment/ Plan:     Principal Problem:   Sepsis ([place]) Active Problems:   Atrial fibrillation with RVR (HCC)   Osteomyelitis of foot, left, acute (HCC)   CKD  stage 4 due to type 2 diabetes mellitus (HCC)   Liver cirrhosis secondary to NASH (nonalcoholic steatohepatitis) (HCC)   Hypertension   History of anemia due to CKD   Frequent falls   AKI (acute kidney injury) ([place])   Cellulitis and abscess of foot, except toes   Infectious tenosynovitis   Wheeze   Atrial fibrillation with rapid ventricular response (HCC)   Bilateral carotid artery stenosis   Neutropenia (HCC)   Drug-induced neutropenia (HCC)   Normocytic anemia   Thrombocytopenia (HCC)  Ms. Autumn [name] is a 70 y.o. white female with hypertension, diabetes mellitus type II, peripheral vascular disease, sleep apnea, atrial fibrillation who is admitted to [facility] on 11/09/2020 for Atrial fibrillation with rapid ventricular response ([place]) [I48.91] AKI (acute kidney injury) ([place]) [N17.9] Osteomyelitis of foot, left, acute ([place]) [M78.675] Atrial fibrillation with RVR ([place]) [I48.91] Worsening renal function [N28.9] Syncope, unspecified syncope type [R55]  Hospital course complicated by acute kidney injury and requiring amputation on 10/29 by Dr. Sherryle Lis. Patient with atrial fibrillation and acute CVA this admission.   #1: Acute kidney injury on chronic kidney disease stage IV with proteinuria: baseline creatinine of 3.19, GFR of 15 on 09/27/2020. Chronic kidney disease secondary to diabetes. Followed by Dr. Radene Knee, [facility]. Holding lisinopril -restarting as outpatient can be considered Acute kidney injury secondary to poor oral intake -Holding diuretics at this time -Creatinine remains elevated - Will continue IVF for now, poor appetite remains   #2: Osteomyelitis: status post left 2nd metatarsal amputation.  Culture shows MSSA/strep mitis/bacteroids and Pasteurella.  Doxycycline and metronidazole for antibiotic therapy. Appreciate ID recommendations   #3: Anemia with chronic kidney disease: status post PRBC transfusions. Appreciate GI input. Status post EGD and colonoscopy.  Dr.  Haig Prophet on November 29, 2020. Noted to have internal hemorrhoids, multiple polyps removed, diverticulosis in sigmoid and descending colon. -Hemoglobin currently 8.2.  EPO 20,000 units subcu weekly    #4: Diabetes mellitus type II with chronic kidney disease: insulin dependent. Hemoglobin A1c of 6.3% on November 09, 2020.   Glucose stable   LOS: [facility] 12/2/20222:22 PM

## 2020-12-13 NOTE — Progress Notes (Signed)
PROGRESS NOTE    SIGRID SCHWEBACH   WUJ:[id number]  DOB: [birth year]  PCP: Harlow Ohms, MD    DOA: 11/09/2020 LOS: 35    Brief Narrative / Hospital Course to Date:   Autumn [name] is a 70 y.o. 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  She'd been following with a [facility] podiatrist outpatient and had recently refused surgery.  She'd recently been treated with antibiotics at [facility] for the diabetic foot infection with enterobacter aerogans culture positive.  She was admitted on 10/29 due to falls at home with worsening L foot chronic diabetic ulcer with osteomyelitis of the 2nd and 3rd metatarsals and cellulitis noted on MRI.  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  Her post operative course was complicated by unresponsiveness and neurologic changes. She was found to have a stroke and was intubated for airway protection.  She was extubated 11/3 and transferred to [facility] service on 11/4.  Hospital course further complicated by acute on chronic anemia with melena after starting on anticoagulation, requiring blood transfusions and GI evaluation.    She developed severe neutropenia and this was attributed to IV Unasyn.  Antibiotics were adjusted and she was started on Neupogen.  She developed atrial fibrillation with RVR which is improved with rate control drugs (metoprolol and Cardizem).  Assessment & Plan   Principal Problem:   Sepsis ([place]) Active Problems:   Atrial fibrillation with RVR (HCC)   Osteomyelitis of foot, left, acute ([place])   CKD stage 4 due to type 2 diabetes mellitus ([place])   Liver cirrhosis secondary to NASH (nonalcoholic steatohepatitis) ([place])   Hypertension   History of anemia due to CKD   Frequent falls   AKI (acute kidney injury) ([place])   Cellulitis and abscess of foot, except toes   Infectious  tenosynovitis   Whee[place]   Atrial fibrillation with rapid ventricular response (HCC)   Bilateral carotid artery stenosis   Neutropenia (HCC)   Drug-induced neutropenia (HCC)   Normocytic anemia   Thrombocytopenia (HCC)   Septic shock from left diabetic foot infection, left foot osteomyelitis and cellulitis, septic arthritis of second MTP joint: S/p amputation ray second left metatarsal, I&D deep abscess multiple fascial planes of the left foot on 11/12/2020.  Of note, surgical wound culture showed strep mitis/oralis, staff aureus, abundant Bacteroides species and rare Pasteurella multocida. --Abx: IV doxycycline and IV Flagyl.   Transition to oral antibiotics once nausea resolves.  She continues to have this intermittently. --Follow-up ID recommendations --Podiatry to reassess and update wound recommendations Wound vac with no output recently.   Severe leukopenia/neutropenia: neurtropenia is improving. Hematology consulted.   Suspect due to Abx and/or diflucan Completed 5 days Granix on 12/1.    Recurrent Melena - in 12/1 afternoon, pt noted to have black stools.  GI bleeding earlier this admission and GI evaluation as outlined.  Possibly due to oral iron supplement although stools had reportedly been normal in color. 12/2: Hemoglobin remaining stable --Hold Eliquis, resume tomorrow morning if hemoglobin remains stable --Monitor closely.   --Bleeding scan if active bleeding.   Atrial fibrillation with RVR, - HR's controlled. Had chest pressure during episode, resolved.  Troponin was unremarkable. --Rate control with cardizem and metoprolol --Hold Eliquis for recurrent melena   Hypertension:  --Continue Cardizem, metoprolol and Eliquis.  Acute stroke with expressive aphasia and dysphagia: --Continue Lipitor and Eliquis.   --Continue dysphagia 3 diet.   Acute GI bleeding/melena on 11/26/2020: EGD on 11/28/2018 showed just an esophageal mucosa, gastritis, erythematous  duodenopathy.  Colonoscopy 11/29/2020 showed multiple polyps that were removed but the colon prep was poor.  Continue Protonix.   Esophageal candidiasis: Fluconazole was discontinued on 12/09/2020 because of neutropenia.  No further treatment indicated.  Monitor.   Right hand/thumb pain and stiffness: Probably from arthritis/gout.   Uric acid was 6.8. Left wrist x-ray on 11/30 showed prominent soft tissue swelling without acute osseous abnormalities, arthritis at the first [facility] joint and STT interval. --Continue allopurinol.   --Analgesics as needed.     Acute hypoxic respiratory failure, probable OSA: Continue BiPAP at night   Acute on chronic diastolic CHF: 2D echo showed normal EF, mild LVH, severely dilated left atrium, indeterminate LV diastolic parameters.    Aspiration pneumonia: Completed treatment   Acute metabolic encephalopathy - resolved --Delirium precautions  Thrombocytopenia: Improving.  Monitor CBC.  CKD stage IV - Nephrology following. Now with Cr slightly worsening in setting of poor PO intake. On gentle IV hydration. Monitor BMP.   Other comorbidities include NASH liver cirrhosis, hypothyroidism, type II DM    Obesity: Body mass index is 34.27 kg/m.  Complicates overall care and prognosis.  Recommend lifestyle modifications including physical activity and diet for weight loss and overall long-term health.   DVT prophylaxis: Place and maintain sequential compression device Start: 11/15/20 1527   Diet:  Diet Orders (From admission, onward)     Start     Ordered   12/11/20 1322  Diet regular Room service appropriate? Yes; Fluid consistency: Thin  Diet effective now       Question Answer Comment  Room service appropriate? Yes   Fluid consistency: Thin      12/11/20 1321              Code Status: Full Code   Subjective 12/13/20    Pt seen awake resting in bed today.  She reports feeling fairly well today.  She did not have any nausea this morning.   Seems in a little better spirits today.  We talked about her dog and she showed me pictures.  Hopes to get out of the hospital soon.   Disposition Plan & Communication   Status is: Inpatient  Remains inpatient appropriate because: Had recurrent melena, monitoring hemoglobin off Eliquis given GI bleed earlier this admission.   Consults, Procedures, Significant Events   Consultants:  Gastroenterology PCCM Neurology Infectious disease Hematology   Procedures:  Intubation on 11/13/2020 EGD and colonoscopy  Arch aortogram, selective cannulation of the left common carotid artery Amputation ray second metatarsal and, I&D deep abscess multiple fascial planes, bone biopsy third metatarsal  Antimicrobials:  Anti-infectives (From admission, onward)    Start     Dose/Rate Route Frequency Ordered Stop   12/10/20 1200  vancomycin (VANCOREADY) IVPB 750 mg/150 mL  Status:  Discontinued        750 mg 150 mL/hr over 60 Minutes Intravenous Every 48 hours 12/08/20 1236 12/09/20 2017   12/10/20 1000  doxycycline (VIBRAMYCIN) 100 mg in sodium chloride 0.9 % 250 mL IVPB        100 mg 125 mL/hr over 120 Minutes Intravenous Every 12 hours 12/09/20 2017 12/23/20 2359   12/08/20 1400  metroNIDAZOLE (FLAGYL) IVPB 500 mg        500 mg 100 mL/hr over 60 Minutes Intravenous Every  12 hours 12/08/20 1231 12/23/20 2359   12/08/20 1400  ciprofloxacin (CIPRO) IVPB 400 mg  Status:  Discontinued        400 mg 200 mL/hr over 60 Minutes Intravenous Every 24 hours 12/08/20 1231 12/09/20 2017   12/08/20 1315  vancomycin (VANCOREADY) IVPB 2000 mg/400 mL        2,000 mg 200 mL/hr over 120 Minutes Intravenous  Once 12/08/20 1218 12/08/20 1506   12/05/20 1130  fluconazole (DIFLUCAN) tablet 200 mg  Status:  Discontinued        200 mg Oral Daily 12/05/20 1046 12/09/20 0725   11/30/20 1400  fluconazole (DIFLUCAN) IVPB 200 mg  Status:  Discontinued        200 mg 100 mL/hr over 60 Minutes Intravenous Every 24 hours  11/29/20 1633 12/05/20 1044   11/29/20 1800  fluconazole (DIFLUCAN) IVPB 400 mg        400 mg 100 mL/hr over 120 Minutes Intravenous  Once 11/29/20 1633 11/30/20 0724   11/29/20 1700  fluconazole (DIFLUCAN) IVPB 200 mg  Status:  Discontinued        200 mg 100 mL/hr over 60 Minutes Intravenous Every 24 hours 11/29/20 1611 11/29/20 1633   11/23/20 0000  ceFAZolin (ANCEF) IVPB 1 g/50 mL premix       Note to Pharmacy: To be given in specials   1 g 100 mL/hr over 30 Minutes Intravenous  Once 11/22/20 1106 11/22/20 1750   11/14/20 2200  Ampicillin-Sulbactam (UNASYN) 3 g in sodium chloride 0.9 % 100 mL IVPB  Status:  Discontinued        3 g 200 mL/hr over 30 Minutes Intravenous Every 12 hours 11/14/20 1610 12/08/20 1143   11/14/20 0900  piperacillin-tazobactam (ZOSYN) IVPB 2.25 g  Status:  Discontinued        2.25 g 100 mL/hr over 30 Minutes Intravenous Every 8 hours 11/14/20 0805 11/14/20 1609   11/13/20 2200  piperacillin-tazobactam (ZOSYN) IVPB 3.375 g  Status:  Discontinued        3.375 g 12.5 mL/hr over 240 Minutes Intravenous Every 12 hours 11/13/20 0758 11/14/20 0805   11/13/20 0600  piperacillin-tazobactam (ZOSYN) IVPB 2.25 g        2.25 g 100 mL/hr over 30 Minutes Intravenous Every 8 hours 11/12/20 2155 11/13/20 1725   11/11/20 1600  vancomycin (VANCOCIN) IVPB 1000 mg/200 mL premix  Status:  Discontinued        1,000 mg 200 mL/hr over 60 Minutes Intravenous Every 48 hours 11/09/20 1618 11/10/20 1045   11/11/20 1558  vancomycin (VANCOCIN) powder  Status:  Discontinued          As needed 11/11/20 1558 11/11/20 1558   11/10/20 2000  vancomycin (VANCOCIN) IVPB 1000 mg/200 mL premix  Status:  Discontinued        1,000 mg 200 mL/hr over 60 Minutes Intravenous Every 48 hours 11/10/20 1918 11/12/20 0940   11/09/20 1800  ceFEPIme (MAXIPIME) 2 g in sodium chloride 0.9 % 100 mL IVPB  Status:  Discontinued        2 g 200 mL/hr over 30 Minutes Intravenous Every 24 hours 11/09/20 1619 11/12/20  2146   11/09/20 1619  vancomycin variable dose per unstable renal function (pharmacist dosing)  Status:  Discontinued         Does not apply See admin instructions 11/09/20 1619 11/12/20 1521   11/09/20 1615  vancomycin (VANCOREADY) IVPB 1750 mg/350 mL        1,750 mg 175  mL/hr over 120 Minutes Intravenous  Once 11/09/20 1606 11/09/20 1947   11/09/20 1600  metroNIDAZOLE (FLAGYL) IVPB 500 mg  Status:  Discontinued        500 mg 100 mL/hr over 60 Minutes Intravenous Every 8 hours 11/09/20 1555 11/12/20 2146         Micro    Objective   Vitals:   12/13/20 0009 12/13/20 0824 12/13/20 1141 12/13/20 1438  BP: 138/88 (!) 140/94 132/82 138/73  Pulse: 91 (!) 106 98 63  Resp: 18 18 19 18   Temp: 98.8 F (37.1 C) 97.6 F (36.4 C) 97.9 F (36.6 C) 98.3 F (36.8 C)  TempSrc:   Oral   SpO2: 96% 97% 97% 97%  Weight:      Height:        Intake/Output Summary (Last 24 hours) at 12/13/2020 1638 Last data filed at 12/13/2020 1437 Gross per 24 hour  Intake 1300 ml  Output 900 ml  Net 400 ml   Filed Weights   12/09/20 0500 12/10/20 0454 12/11/20 0242  Weight: 95.9 kg 92.2 kg 96.3 kg    Physical Exam:  General exam: awake, alert, no acute distress, obese Respiratory system: lungs clear, normal respiratory effort, on room air. Cardiovascular system: normal S1/S2, RRR, stable trace LE edema.   Gastrointestinal system: soft, non-tender abdomen Central nervous system: A&O x3, dysarthric speech little better today. no other gross focal neurologic deficits Psychiatry: normal mood, congruent affect, judgement and insight appear normal  Labs   Data Reviewed: I have personally reviewed following labs and imaging studies  CBC: Recent Labs  Lab 12/09/20 0617 12/10/20 0933 12/11/20 0556 12/12/20 0434 12/12/20 1732 12/13/20 0536  WBC 1.7* 2.0* 2.8* 7.6  --  21.1*  NEUTROABS 0.1* 0.1* 0.3* 0.9*  --  13.9*  HGB 8.4* 8.2* 8.5* 8.1* 8.1* 8.2*  HCT 26.7* 25.6* 26.9* 25.9* 26.1* 26.4*   MCV 92.4 92.8 91.5 92.2  --  93.0  PLT 130* 122* 134* 127*  --  841*   Basic Metabolic Panel: Recent Labs  Lab 12/08/20 0956 12/09/20 0617 12/10/20 0513 12/11/20 0556 12/12/20 0434 12/13/20 0536  NA 139 137  --   --  134* 137  K 3.6 4.6  --   --  4.3 4.0  CL 106 104  --   --  104 107  CO2 23 24  --   --  22 21*  GLUCOSE 112* 104*  --   --  107* 88  BUN 56* 53*  --   --  54* 55*  CREATININE 3.50* 3.57* 3.70* 3.60* 3.74* 3.83*  CALCIUM 8.5* 8.6*  --   --  8.6* 8.7*  MG 2.1  --   --   --   --  2.0   GFR: Estimated Creatinine Clearance: 16.4 mL/min (A) (by C-G formula based on SCr of 3.83 mg/dL (H)). Liver Function Tests: Recent Labs  Lab 12/13/20 0536  AST 23  ALT 11  ALKPHOS 138*  BILITOT 0.6  PROT 5.4*  ALBUMIN 2.4*   No results for input(s): LIPASE, AMYLASE in the last 168 hours. No results for input(s): AMMONIA in the last 168 hours. Coagulation Profile: No results for input(s): INR, PROTIME in the last 168 hours. Cardiac Enzymes: No results for input(s): CKTOTAL, CKMB, CKMBINDEX, TROPONINI in the last 168 hours. BNP (last 3 results) No results for input(s): PROBNP in the last 8760 hours. HbA1C: No results for input(s): HGBA1C in the last 72 hours. CBG: No results for input(s):  GLUCAP in the last 168 hours. Lipid Profile: No results for input(s): CHOL, HDL, LDLCALC, TRIG, CHOLHDL, LDLDIRECT in the last 72 hours. Thyroid Function Tests: No results for input(s): TSH, T4TOTAL, FREET4, T3FREE, THYROIDAB in the last 72 hours. Anemia Panel: No results for input(s): VITAMINB12, FOLATE, FERRITIN, TIBC, IRON, RETICCTPCT in the last 72 hours.  Sepsis Labs: No results for input(s): PROCALCITON, LATICACIDVEN in the last 168 hours.  Recent Results (from the past 240 hour(s))  Resp Panel by RT-PCR (Flu A&B, Covid) Nasopharyngeal Swab     Status: None   Collection Time: 12/04/20 11:07 AM   Specimen: Nasopharyngeal Swab; Nasopharyngeal(NP) swabs in vial transport medium   Result Value Ref Range Status   SARS Coronavirus 2 by RT PCR NEGATIVE NEGATIVE Final    Comment: (NOTE) SARS-CoV-2 target nucleic acids are NOT DETECTED.  The SARS-CoV-2 RNA is generally detectable in upper respiratory specimens during the acute phase of infection. The lowest concentration of SARS-CoV-2 viral copies this assay can detect is 138 copies/mL. A negative result does not preclude SARS-Cov-2 infection and should not be used as the sole basis for treatment or other patient management decisions. A negative result may occur with  improper specimen collection/handling, submission of specimen other than nasopharyngeal swab, presence of viral mutation(s) within the areas targeted by this assay, and inadequate number of viral copies(<138 copies/mL). A negative result must be combined with clinical observations, patient history, and epidemiological information. The expected result is Negative.  Fact Sheet for Patients:  EntrepreneurPulse.com.au  Fact Sheet for Healthcare Providers:  IncredibleEmployment.be  This test is no t yet approved or cleared by the [place] FDA and  has been authorized for detection and/or diagnosis of SARS-CoV-2 by FDA under an Emergency Use Authorization (EUA). This EUA will remain  in effect (meaning this test can be used) for the duration of the COVID-19 declaration under Section 564(b)(1) of the Act, 21 U.S.C.section 360bbb-3(b)(1), unless the authorization is terminated  or revoked sooner.       Influenza A by PCR NEGATIVE NEGATIVE Final   Influenza B by PCR NEGATIVE NEGATIVE Final    Comment: (NOTE) The Xpert Xpress SARS-CoV-2/FLU/RSV plus assay is intended as an aid in the diagnosis of influenza from Nasopharyngeal swab specimens and should not be used as a sole basis for treatment. Nasal washings and aspirates are unacceptable for Xpert Xpress SARS-CoV-2/FLU/RSV testing.  Fact Sheet for  Patients: EntrepreneurPulse.com.au  Fact Sheet for Healthcare Providers: IncredibleEmployment.be  This test is not yet approved or cleared by the [place] FDA and has been authorized for detection and/or diagnosis of SARS-CoV-2 by FDA under an Emergency Use Authorization (EUA). This EUA will remain in effect (meaning this test can be used) for the duration of the COVID-19 declaration under Section 564(b)(1) of the Act, 21 U.S.C. section 360bbb-3(b)(1), unless the authorization is terminated or revoked.  Performed at [facility], [address] [place], [place] [address]       Imaging Studies   No results found.   Medications   Scheduled Meds:  (feeding supplement) PROSource Plus  30 mL Oral TID BM   allopurinol  50 mg Oral QODAY   vitamin C  500 mg Oral BID   atorvastatin  40 mg Oral Daily   budesonide (PULMICORT) nebulizer solution  0.5 mg Nebulization BID   diltiazem  180 mg Oral Daily   epoetin (EPOGEN/PROCRIT) injection  20,000 Units Subcutaneous Weekly   feeding supplement  1 Container Oral TID BM  ferrous gluconate  324 mg Oral Q breakfast   hydrALAZINE  100 mg Oral Q8H   levothyroxine  100 mcg Oral Q0600   mouth rinse  15 mL Mouth Rinse BID   metoprolol tartrate  50 mg Oral BID   multivitamin with minerals  1 tablet Oral Daily   pantoprazole  40 mg Oral BID   sodium chloride flush  3 mL Intravenous Q12H   sodium chloride flush  3 mL Intravenous Q12H   venlafaxine  50 mg Oral BID   Continuous Infusions:  sodium chloride 10 mL/hr at 12/05/20 0604   sodium chloride Stopped (12/06/20 1004)   sodium chloride 250 mL (11/25/20 1208)   sodium chloride 50 mL/hr at 12/11/20 1734   doxycycline (VIBRAMYCIN) IV 100 mg (12/13/20 0910)   metronidazole 500 mg (12/13/20 1340)       LOS: 34 days    Time spent: 30 minutes with > 50% spent at bedside and in coordination of care     Ezekiel Slocumb, DO [facility]  12/13/2020, 4:38 PM      If 7PM-7AM, please contact night-coverage. How to contact the [facility] Attending or Consulting provider [place] or covering provider during after hours [place], for this patient?    Check the care team in [facility] and look for a) attending/consulting [facility] provider listed and b) the [facility] team listed Log into [email or web address] and use [place] universal password to access. If you do not have the password, please contact the hospital operator. Locate the [facility] provider you are looking for under [facility] and page to a number that you can be directly reached. If you still have difficulty reaching the provider, please page the [facility] (Director on Call) for the Hospitalists listed on amion for assistance.

## 2020-12-13 NOTE — Progress Notes (Signed)
Changed patient dressing.  ID physician assessed wound during this time.  Recommended Wound Nurse to reassess site to determine if it needs additional treatment.    Patient tolerated dressing change well.  No drainage noted at both sites.

## 2020-12-14 DIAGNOSIS — N184 Chronic kidney disease, stage 4 (severe): Secondary | ICD-10-CM | POA: Diagnosis not present

## 2020-12-14 DIAGNOSIS — M86172 Other acute osteomyelitis, left ankle and foot: Secondary | ICD-10-CM | POA: Diagnosis not present

## 2020-12-14 DIAGNOSIS — E1122 Type 2 diabetes mellitus with diabetic chronic kidney disease: Secondary | ICD-10-CM | POA: Diagnosis not present

## 2020-12-14 LAB — BASIC METABOLIC PANEL
Anion gap: 12 (ref 5–15)
BUN: 55 mg/dL — ABNORMAL HIGH (ref 8–23)
CO2: 18 mmol/L — ABNORMAL LOW (ref 22–32)
Calcium: 8.5 mg/dL — ABNORMAL LOW (ref 8.9–10.3)
Chloride: 106 mmol/L (ref 98–111)
Creatinine, Ser: 3.7 mg/dL — ABNORMAL HIGH (ref 0.44–1.00)
GFR, Estimated: 13 mL/min — ABNORMAL LOW (ref >60)
Glucose, Bld: 100 mg/dL — ABNORMAL HIGH (ref 70–99)
Potassium: 4.2 mmol/L (ref 3.5–5.1)
Sodium: 136 mmol/L (ref 135–145)

## 2020-12-14 LAB — CBC WITH DIFFERENTIAL/PLATELET
Abs Immature Granulocytes: 1.11 10*3/uL — ABNORMAL HIGH (ref 0.00–0.07)
Basophils Absolute: 0.1 10*3/uL (ref 0.0–0.1)
Basophils Relative: 0 %
Eosinophils Absolute: 0.1 10*3/uL (ref 0.0–0.5)
Eosinophils Relative: 0 %
HCT: 26.7 % — ABNORMAL LOW (ref 36.0–46.0)
Hemoglobin: 8.2 g/dL — ABNORMAL LOW (ref 12.0–15.0)
Immature Granulocytes: 5 %
Lymphocytes Relative: 11 %
Lymphs Abs: 2.5 10*3/uL (ref 0.7–4.0)
MCH: 29 pg (ref 26.0–34.0)
MCHC: 30.7 g/dL (ref 30.0–36.0)
MCV: 94.3 fL (ref 80.0–100.0)
Monocytes Absolute: 2.6 10*3/uL — ABNORMAL HIGH (ref 0.1–1.0)
Monocytes Relative: 11 %
Neutro Abs: 17.1 10*3/uL — ABNORMAL HIGH (ref 1.7–7.7)
Neutrophils Relative %: 73 %
Platelets: 154 10*3/uL (ref 150–400)
RBC: 2.83 MIL/uL — ABNORMAL LOW (ref 3.87–5.11)
RDW: 17.5 % — ABNORMAL HIGH (ref 11.5–15.5)
Smear Review: NORMAL
WBC: 23.5 10*3/uL — ABNORMAL HIGH (ref 4.0–10.5)
nRBC: 0.5 % — ABNORMAL HIGH (ref 0.0–0.2)

## 2020-12-14 MED ORDER — DOXYCYCLINE HYCLATE 100 MG PO TABS
100.0000 mg | ORAL_TABLET | Freq: Two times a day (BID) | ORAL | Status: DC
  Administered 2020-12-14 – 2020-12-21 (×15): 100 mg via ORAL
  Filled 2020-12-14 (×15): qty 1

## 2020-12-14 MED ORDER — APIXABAN 5 MG PO TABS
5.0000 mg | ORAL_TABLET | Freq: Two times a day (BID) | ORAL | Status: DC
  Administered 2020-12-14 – 2020-12-21 (×15): 5 mg via ORAL
  Filled 2020-12-14 (×15): qty 1

## 2020-12-14 MED ORDER — METRONIDAZOLE 500 MG/100ML IV SOLN
500.0000 mg | Freq: Two times a day (BID) | INTRAVENOUS | Status: DC
  Administered 2020-12-14 – 2020-12-21 (×14): 500 mg via INTRAVENOUS
  Filled 2020-12-14 (×17): qty 100

## 2020-12-14 NOTE — Progress Notes (Signed)
[facility]  PROGRESS NOTE   Subjective:    Patient was seen today on the second floor Patient is awake resting comfortably in the bed. Patient responding appropriately Patient offers no new specific physical complaints  Objective:  Vital signs in last 24 hours:  Temp:  [97.8 F (36.6 C)-98.7 F (37.1 C)] 98.1 F (36.7 C) (12/03 1103) Pulse Rate:  [63-98] 88 (12/03 1103) Resp:  [17-22] 18 (12/03 1103) BP: (120-138)/(69-84) 134/81 (12/03 1103) SpO2:  [86 %-97 %] 97 % (12/03 1103) Weight:  [96.5 kg] 96.5 kg (12/03 0300)  Weight change:  Filed Weights   12/10/20 0454 12/11/20 0242 12/14/20 0300  Weight: 92.2 kg 96.3 kg 96.5 kg    Intake/Output: I/O last 3 completed shifts: In: 0017 [P.O.:720; I.V.:480; IV Piggyback:550] Out: 1100 [Urine:1100]   Intake/Output this shift:  Total I/O In: 480 [P.O.:480] Out: 300 [Urine:300]  Physical Exam: General:  No acute distress, resting in bed  Head:  Normocephalic, atraumatic. Moist oral mucosal membranes  Lungs:   normal effort on room air, Decreased breath sounds at bases  Heart:  irregular  Abdomen:   Soft, nondistended  Extremities: Trace right lower extremity peripheral edema. Gauze dressing left foot, NPWV   Neurologic:  Awake, alert, following commands  Skin:  No lesions       Basic Metabolic Panel: Recent Labs  Lab 12/08/20 0956 12/09/20 0617 12/10/20 0513 12/11/20 0556 12/12/20 0434 12/13/20 0536 12/14/20 0513  NA 139 137  --   --  134* 137 136  K 3.6 4.6  --   --  4.3 4.0 4.2  CL 106 104  --   --  104 107 106  CO2 23 24  --   --  22 21* 18*  GLUCOSE 112* 104*  --   --  107* 88 100*  BUN 56* 53*  --   --  54* 55* 55*  CREATININE 3.50* 3.57* 3.70* 3.60* 3.74* 3.83* 3.70*  CALCIUM 8.5* 8.6*  --   --  8.6* 8.7* 8.5*  MG 2.1  --   --   --   --  2.0  --     CBC: Recent Labs  Lab 12/10/20 0933 12/11/20 0556 12/12/20 0434 12/12/20 1732 12/13/20 0536 12/14/20 0513  WBC 2.0* 2.8* 7.6  --   21.1* 23.5*  NEUTROABS 0.1* 0.3* 0.9*  --  13.9* 17.1*  HGB 8.2* 8.5* 8.1* 8.1* 8.2* 8.2*  HCT 25.6* 26.9* 25.9* 26.1* 26.4* 26.7*  MCV 92.8 91.5 92.2  --  93.0 94.3  PLT 122* 134* 127*  --  147* 154     Urinalysis: No results for input(s): COLORURINE, LABSPEC, PHURINE, GLUCOSEU, HGBUR, BILIRUBINUR, KETONESUR, PROTEINUR, UROBILINOGEN, NITRITE, LEUKOCYTESUR in the last 72 hours.  Invalid input(s): APPERANCEUR    Imaging: No results found.   Medications:    sodium chloride 10 mL/hr at 12/05/20 0604   sodium chloride Stopped (12/06/20 1004)   sodium chloride 50 mL/hr at 12/14/20 4944   metronidazole      (feeding supplement) PROSource Plus  30 mL Oral TID BM   allopurinol  50 mg Oral QODAY   apixaban  5 mg Oral BID   vitamin C  500 mg Oral BID   atorvastatin  40 mg Oral Daily   budesonide (PULMICORT) nebulizer solution  0.5 mg Nebulization BID   diltiazem  180 mg Oral Daily   doxycycline  100 mg Oral Q12H   epoetin (EPOGEN/PROCRIT) injection  20,000 Units Subcutaneous Weekly  feeding supplement  1 Container Oral TID BM   ferrous gluconate  324 mg Oral Q breakfast   hydrALAZINE  100 mg Oral Q8H   levothyroxine  100 mcg Oral Q0600   mouth rinse  15 mL Mouth Rinse BID   metoprolol tartrate  50 mg Oral BID   multivitamin with minerals  1 tablet Oral Daily   pantoprazole  40 mg Oral BID   sodium chloride flush  3 mL Intravenous Q12H   venlafaxine  50 mg Oral BID    Assessment/ Plan:     Principal Problem:   Sepsis ([place]) Active Problems:   Atrial fibrillation with RVR (HCC)   Osteomyelitis of foot, left, acute (HCC)   CKD stage 4 due to type 2 diabetes mellitus (HCC)   Liver cirrhosis secondary to NASH (nonalcoholic steatohepatitis) (HCC)   Hypertension   History of anemia due to CKD   Frequent falls   AKI (acute kidney injury) ([place])   Cellulitis and abscess of foot, except toes   Infectious tenosynovitis   Wheeze   Atrial fibrillation with rapid ventricular  response (HCC)   Bilateral carotid artery stenosis   Neutropenia (HCC)   Drug-induced neutropenia (HCC)   Normocytic anemia   Thrombocytopenia (HCC)  Ms. Autumn [name] is a 70 y.o. white female with hypertension, diabetes mellitus type II, peripheral vascular disease, sleep apnea, atrial fibrillation who is admitted to [facility] on 11/09/2020 for Atrial fibrillation with rapid ventricular response ([place]) [I48.91] AKI (acute kidney injury) ([place]) [N17.9] Osteomyelitis of foot, left, acute (HCC) [B28.413] Atrial fibrillation with RVR ([place]) [I48.91] Worsening renal function [N28.9] Syncope, unspecified syncope type [R55]  Hospital course complicated by acute kidney injury and requiring amputation on 10/29 by Dr. Sherryle Lis. Patient with atrial fibrillation and acute CVA this admission.   #1: Renal. Patient has acute kidney injury on chronic kidney disease Patient had AKI secondary to ATN Patient lisinopril is now being held Patient has CKD stage IV. Patient has CKD going back to 2019. Patient has CKD most likely from diabetes mellitus. Patient baseline creatinine is around 3.2--3.6.   Acute kidney injury on chronic kidney disease stage IV with proteinuria: baseline creatinine of 3.19, GFR of 15 on 09/27/2020.   Patient 's  creatinine peaked at 5.2. Patient creatinine is now stable at 3.7    #2: Osteomyelitis: Patient was admitted with osteomyelitis. 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   Patient is currently being followed by podiatry/ID     #3: Anemia with chronic kidney disease:  Anemia of chronic disease Patient hemoglobin stable at 8.2 Patient is on Epogen Patient did receive PRBC during this admission Patient also underwent GI work-up as well Status post EGD and colonoscopy.  Dr. Haig Prophet on November 29, 2020.  Patient was noted to have internal hemorrhoids, multiple polyps removed, diverticulosis in sigmoid and descending colon. We will continue EPO  20,000 units subcu weekly    #4: Diabetes mellitus type II with chronic kidney disease: insulin dependent. Hemoglobin A1c of 6.3% on November 09, 2020.   Glucose stable   #5Hypertension Patient blood pressure is at goal   Plan We will continue current treatment plan for now Patient AKI is improving Patient hemoglobin is stable    LOS: [facility] 12/3/20221:52 PM

## 2020-12-14 NOTE — Progress Notes (Signed)
PROGRESS NOTE    Autumn [name]   PNT:[id number]  DOB: Jan 05, 1951  PCP: Harlow Ohms, MD    DOA: 11/09/2020 LOS: 23    Brief Narrative / Hospital Course to Date:   Autumn [name] is a 70 y.o. 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  She'd been following with a [facility] podiatrist outpatient and had recently refused surgery.  She'd recently been treated with antibiotics at [facility] for the diabetic foot infection with enterobacter aerogans culture positive.  She was admitted on 10/29 due to falls at home with worsening L foot chronic diabetic ulcer with osteomyelitis of the 2nd and 3rd metatarsals and cellulitis noted on MRI.  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  Her post operative course was complicated by unresponsiveness and neurologic changes. She was found to have a stroke and was intubated for airway protection.  She was extubated 11/3 and transferred to [facility] service on 11/4.  Hospital course further complicated by acute on chronic anemia with melena after starting on anticoagulation, requiring blood transfusions and GI evaluation.    She developed severe neutropenia and this was attributed to IV Unasyn.  Antibiotics were adjusted and she was started on Neupogen.  She developed atrial fibrillation with RVR which is improved with rate control drugs (metoprolol and Cardizem).  Assessment & Plan   Principal Problem:   Sepsis ([place]) Active Problems:   Atrial fibrillation with RVR (HCC)   Osteomyelitis of foot, left, acute ([place])   CKD stage 4 due to type 2 diabetes mellitus ([place])   Liver cirrhosis secondary to NASH (nonalcoholic steatohepatitis) ([place])   Hypertension   History of anemia due to CKD   Frequent falls   AKI (acute kidney injury) ([place])   Cellulitis and abscess of foot, except toes   Infectious  tenosynovitis   Wheeze   Atrial fibrillation with rapid ventricular response (HCC)   Bilateral carotid artery stenosis   Neutropenia (HCC)   Drug-induced neutropenia (HCC)   Normocytic anemia   Thrombocytopenia (HCC)   Septic shock from left diabetic foot infection, left foot osteomyelitis and cellulitis, septic arthritis of second MTP joint: S/p amputation ray second left metatarsal, I&D deep abscess multiple fascial planes of the left foot on 11/12/2020.  Of note, surgical wound culture showed strep mitis/oralis, staff aureus, abundant Bacteroides species and rare Pasteurella multocida. --Abx: IV doxycycline and IV Flagyl.   --Transition to PO antibiotics this afternoon w/nausea improved --Follow-up ID recommendations --Podiatry reassessed - see recs for wound care   Severe leukopenia/neutropenia: neurtropenia is improving. Hematology consulted.   Suspect due to Abx and/or diflucan Completed 5 days Granix on 12/1.    Recurrent Melena - in 12/1 afternoon, pt noted to have black stools.  GI bleeding earlier this admission and GI evaluation as outlined.  Possibly due to oral iron supplement although stools had reportedly been normal in color. 12/2: Hemoglobin remaining stable --Resume Eliquis and monitor Hbg --Monitor closely.   --Bleeding scan if active bleeding.   Atrial fibrillation with RVR, - HR's controlled. Had chest pressure during episode, resolved.  Troponin was unremarkable. --Rate control with cardizem and metoprolol --Resume Eliquis (held due to recurrent melena)   Hypertension --Continue Cardizem, metoprolol.     Acute stroke with expressive aphasia and dysphagia: --Continue Lipitor and Eliquis.   --Continue dysphagia 3  diet.   Acute GI bleeding/melena on 11/26/2020: EGD on 11/28/2018 showed just an esophageal mucosa, gastritis, erythematous duodenopathy.  Colonoscopy 11/29/2020 showed multiple polyps that were removed but the colon prep was poor.  Continue  Protonix.   Esophageal candidiasis: Fluconazole was discontinued on 12/09/2020 because of neutropenia.  No further treatment indicated.  Monitor.   Right hand/thumb pain and stiffness: Probably from arthritis/gout.   Uric acid was 6.8. Left wrist x-ray on 11/30 showed prominent soft tissue swelling without acute osseous abnormalities, arthritis at the first [facility] joint and STT interval. --Continue allopurinol.   --Analgesics as needed.     Acute hypoxic respiratory failure, probable OSA: Continue BiPAP at night   Acute on chronic diastolic CHF: 2D echo showed normal EF, mild LVH, severely dilated left atrium, indeterminate LV diastolic parameters.    Aspiration pneumonia: Completed treatment   Acute metabolic encephalopathy - resolved --Delirium precautions  Thrombocytopenia: Improving.  Monitor CBC.  CKD stage IV - Nephrology following. Now with Cr slightly worsening in setting of poor PO intake. On gentle IV hydration. Monitor BMP.   Other comorbidities include NASH liver cirrhosis, hypothyroidism, type II DM    Obesity: Body mass index is 34.34 kg/m.  Complicates overall care and prognosis.  Recommend lifestyle modifications including physical activity and diet for weight loss and overall long-term health.   DVT prophylaxis: Place and maintain sequential compression device Start: 11/15/20 1527 apixaban (ELIQUIS) tablet 5 mg   Diet:  Diet Orders (From admission, onward)     Start     Ordered   12/11/20 1322  Diet regular Room service appropriate? Yes; Fluid consistency: Thin  Diet effective now       Question Answer Comment  Room service appropriate? Yes   Fluid consistency: Thin      12/11/20 1321              Code Status: Full Code   Subjective 12/14/20    Pt reports feeling well today.  No nausea this AM.  Hopes to go to rehab soon.  Enjoys having her dog's picture on bedside table.  In good spirits.  No other acute complaints.   Disposition Plan &  Communication   Status is: Inpatient  Remains inpatient appropriate because: Had recurrent melena, monitoring hemoglobin with resuming Eliquis, worsened renal function.   Consults, Procedures, Significant Events   Consultants:  Gastroenterology PCCM Neurology Infectious disease Hematology   Procedures:  Intubation on 11/13/2020 EGD and colonoscopy  Arch aortogram, selective cannulation of the left common carotid artery Amputation ray second metatarsal and, I&D deep abscess multiple fascial planes, bone biopsy third metatarsal  Antimicrobials:  Anti-infectives (From admission, onward)    Start     Dose/Rate Route Frequency Ordered Stop   12/14/20 1800  metroNIDAZOLE (FLAGYL) IVPB 500 mg        500 mg 100 mL/hr over 60 Minutes Intravenous 2 times daily 12/14/20 0837     12/14/20 1000  doxycycline (VIBRA-TABS) tablet 100 mg        100 mg Oral Every 12 hours 12/14/20 0836     12/10/20 1200  vancomycin (VANCOREADY) IVPB 750 mg/150 mL  Status:  Discontinued        750 mg 150 mL/hr over 60 Minutes Intravenous Every 48 hours 12/08/20 1236 12/09/20 2017   12/10/20 1000  doxycycline (VIBRAMYCIN) 100 mg in sodium chloride 0.9 % 250 mL IVPB  Status:  Discontinued        100 mg 125 mL/hr over 120 Minutes  Intravenous Every 12 hours 12/09/20 2017 12/14/20 0836   12/08/20 1400  metroNIDAZOLE (FLAGYL) IVPB 500 mg  Status:  Discontinued        500 mg 100 mL/hr over 60 Minutes Intravenous Every 12 hours 12/08/20 1231 12/14/20 0837   12/08/20 1400  ciprofloxacin (CIPRO) IVPB 400 mg  Status:  Discontinued        400 mg 200 mL/hr over 60 Minutes Intravenous Every 24 hours 12/08/20 1231 12/09/20 2017   12/08/20 1315  vancomycin (VANCOREADY) IVPB 2000 mg/400 mL        2,000 mg 200 mL/hr over 120 Minutes Intravenous  Once 12/08/20 1218 12/08/20 1506   12/05/20 1130  fluconazole (DIFLUCAN) tablet 200 mg  Status:  Discontinued        200 mg Oral Daily 12/05/20 1046 12/09/20 0725   11/30/20 1400   fluconazole (DIFLUCAN) IVPB 200 mg  Status:  Discontinued        200 mg 100 mL/hr over 60 Minutes Intravenous Every 24 hours 11/29/20 1633 12/05/20 1044   11/29/20 1800  fluconazole (DIFLUCAN) IVPB 400 mg        400 mg 100 mL/hr over 120 Minutes Intravenous  Once 11/29/20 1633 11/30/20 0724   11/29/20 1700  fluconazole (DIFLUCAN) IVPB 200 mg  Status:  Discontinued        200 mg 100 mL/hr over 60 Minutes Intravenous Every 24 hours 11/29/20 1611 11/29/20 1633   11/23/20 0000  ceFAZolin (ANCEF) IVPB 1 g/50 mL premix       Note to Pharmacy: To be given in specials   1 g 100 mL/hr over 30 Minutes Intravenous  Once 11/22/20 1106 11/22/20 1750   11/14/20 2200  Ampicillin-Sulbactam (UNASYN) 3 g in sodium chloride 0.9 % 100 mL IVPB  Status:  Discontinued        3 g 200 mL/hr over 30 Minutes Intravenous Every 12 hours 11/14/20 1610 12/08/20 1143   11/14/20 0900  piperacillin-tazobactam (ZOSYN) IVPB 2.25 g  Status:  Discontinued        2.25 g 100 mL/hr over 30 Minutes Intravenous Every 8 hours 11/14/20 0805 11/14/20 1609   11/13/20 2200  piperacillin-tazobactam (ZOSYN) IVPB 3.375 g  Status:  Discontinued        3.375 g 12.5 mL/hr over 240 Minutes Intravenous Every 12 hours 11/13/20 0758 11/14/20 0805   11/13/20 0600  piperacillin-tazobactam (ZOSYN) IVPB 2.25 g        2.25 g 100 mL/hr over 30 Minutes Intravenous Every 8 hours 11/12/20 2155 11/13/20 1725   11/11/20 1600  vancomycin (VANCOCIN) IVPB 1000 mg/200 mL premix  Status:  Discontinued        1,000 mg 200 mL/hr over 60 Minutes Intravenous Every 48 hours 11/09/20 1618 11/10/20 1045   11/11/20 1558  vancomycin (VANCOCIN) powder  Status:  Discontinued          As needed 11/11/20 1558 11/11/20 1558   11/10/20 2000  vancomycin (VANCOCIN) IVPB 1000 mg/200 mL premix  Status:  Discontinued        1,000 mg 200 mL/hr over 60 Minutes Intravenous Every 48 hours 11/10/20 1918 11/12/20 0940   11/09/20 1800  ceFEPIme (MAXIPIME) 2 g in sodium chloride 0.9  % 100 mL IVPB  Status:  Discontinued        2 g 200 mL/hr over 30 Minutes Intravenous Every 24 hours 11/09/20 1619 11/12/20 2146   11/09/20 1619  vancomycin variable dose per unstable renal function (pharmacist dosing)  Status:  Discontinued  Does not apply See admin instructions 11/09/20 1619 11/12/20 1521   11/09/20 1615  vancomycin (VANCOREADY) IVPB 1750 mg/350 mL        1,750 mg 175 mL/hr over 120 Minutes Intravenous  Once 11/09/20 1606 11/09/20 1947   11/09/20 1600  metroNIDAZOLE (FLAGYL) IVPB 500 mg  Status:  Discontinued        500 mg 100 mL/hr over 60 Minutes Intravenous Every 8 hours 11/09/20 1555 11/12/20 2146         Micro    Objective   Vitals:   12/14/20 0743 12/14/20 0810 12/14/20 1103 12/14/20 1500  BP: 130/84  134/81 136/74  Pulse: 98 98 88 82  Resp: 18 (!) 22 18   Temp: 98 F (36.7 C)  98.1 F (36.7 C) 98.1 F (36.7 C)  TempSrc:    Oral  SpO2: 96% 93% 97% 96%  Weight:      Height:        Intake/Output Summary (Last 24 hours) at 12/14/2020 1757 Last data filed at 12/14/2020 1739 Gross per 24 hour  Intake 960 ml  Output 800 ml  Net 160 ml   Filed Weights   12/10/20 0454 12/11/20 0242 12/14/20 0300  Weight: 92.2 kg 96.3 kg 96.5 kg    Physical Exam:  General exam: awake, alert, no acute distress, obese Respiratory system: normal respiratory effort, on room air. Cardiovascular system: RRR, stable trace LE edema.   Central nervous system: A&O x3, dysarthric speech stable and improved. no other gross focal neurologic deficits Psychiatry: normal mood, congruent affect, judgement and insight appear normal  Labs   Data Reviewed: I have personally reviewed following labs and imaging studies  CBC: Recent Labs  Lab 12/10/20 0933 12/11/20 0556 12/12/20 0434 12/12/20 1732 12/13/20 0536 12/14/20 0513  WBC 2.0* 2.8* 7.6  --  21.1* 23.5*  NEUTROABS 0.1* 0.3* 0.9*  --  13.9* 17.1*  HGB 8.2* 8.5* 8.1* 8.1* 8.2* 8.2*  HCT 25.6* 26.9* 25.9* 26.1*  26.4* 26.7*  MCV 92.8 91.5 92.2  --  93.0 94.3  PLT 122* 134* 127*  --  147* 937   Basic Metabolic Panel: Recent Labs  Lab 12/08/20 0956 12/09/20 0617 12/10/20 0513 12/11/20 0556 12/12/20 0434 12/13/20 0536 12/14/20 0513  NA 139 137  --   --  134* 137 136  K 3.6 4.6  --   --  4.3 4.0 4.2  CL 106 104  --   --  104 107 106  CO2 23 24  --   --  22 21* 18*  GLUCOSE 112* 104*  --   --  107* 88 100*  BUN 56* 53*  --   --  54* 55* 55*  CREATININE 3.50* 3.57* 3.70* 3.60* 3.74* 3.83* 3.70*  CALCIUM 8.5* 8.6*  --   --  8.6* 8.7* 8.5*  MG 2.1  --   --   --   --  2.0  --    GFR: Estimated Creatinine Clearance: 17 mL/min (A) (by C-G formula based on SCr of 3.7 mg/dL (H)). Liver Function Tests: Recent Labs  Lab 12/13/20 0536  AST 23  ALT 11  ALKPHOS 138*  BILITOT 0.6  PROT 5.4*  ALBUMIN 2.4*   No results for input(s): LIPASE, AMYLASE in the last 168 hours. No results for input(s): AMMONIA in the last 168 hours. Coagulation Profile: No results for input(s): INR, PROTIME in the last 168 hours. Cardiac Enzymes: No results for input(s): CKTOTAL, CKMB, CKMBINDEX, TROPONINI in the last 168  hours. BNP (last 3 results) No results for input(s): PROBNP in the last 8760 hours. HbA1C: No results for input(s): HGBA1C in the last 72 hours. CBG: No results for input(s): GLUCAP in the last 168 hours. Lipid Profile: No results for input(s): CHOL, HDL, LDLCALC, TRIG, CHOLHDL, LDLDIRECT in the last 72 hours. Thyroid Function Tests: No results for input(s): TSH, T4TOTAL, FREET4, T3FREE, THYROIDAB in the last 72 hours. Anemia Panel: No results for input(s): VITAMINB12, FOLATE, FERRITIN, TIBC, IRON, RETICCTPCT in the last 72 hours.  Sepsis Labs: No results for input(s): PROCALCITON, LATICACIDVEN in the last 168 hours.  No results found for this or any previous visit (from the past 240 hour(s)).     Imaging Studies   No results found.   Medications   Scheduled Meds:  (feeding  supplement) PROSource Plus  30 mL Oral TID BM   allopurinol  50 mg Oral QODAY   apixaban  5 mg Oral BID   vitamin C  500 mg Oral BID   atorvastatin  40 mg Oral Daily   budesonide (PULMICORT) nebulizer solution  0.5 mg Nebulization BID   diltiazem  180 mg Oral Daily   doxycycline  100 mg Oral Q12H   epoetin (EPOGEN/PROCRIT) injection  20,000 Units Subcutaneous Weekly   feeding supplement  1 Container Oral TID BM   ferrous gluconate  324 mg Oral Q breakfast   hydrALAZINE  100 mg Oral Q8H   levothyroxine  100 mcg Oral Q0600   mouth rinse  15 mL Mouth Rinse BID   metoprolol tartrate  50 mg Oral BID   multivitamin with minerals  1 tablet Oral Daily   pantoprazole  40 mg Oral BID   sodium chloride flush  3 mL Intravenous Q12H   venlafaxine  50 mg Oral BID   Continuous Infusions:  sodium chloride 10 mL/hr at 12/05/20 0604   sodium chloride Stopped (12/06/20 1004)   sodium chloride 50 mL/hr at 12/14/20 3710   metronidazole 500 mg (12/14/20 1714)       LOS: 35 days    Time spent: 25 minutes with > 50% spent at bedside and in coordination of care     Ezekiel Slocumb, DO [facility]  12/14/2020, 5:57 PM      If 7PM-7AM, please contact night-coverage. How to contact the [facility] Attending or Consulting provider [place] or covering provider during after hours [place], for this patient?    Check the care team in [facility] and look for a) attending/consulting [facility] provider listed and b) the [facility] team listed Log into [email or web address] and use [place]'s universal password to access. If you do not have the password, please contact the hospital operator. Locate the [facility] provider you are looking for under [facility] and page to a number that you can be directly reached. If you still have difficulty reaching the provider, please page the [facility] (Director on Call) for the Hospitalists listed on amion for assistance.

## 2020-12-15 DIAGNOSIS — D708 Other neutropenia: Secondary | ICD-10-CM

## 2020-12-15 DIAGNOSIS — A419 Sepsis, unspecified organism: Secondary | ICD-10-CM | POA: Diagnosis not present

## 2020-12-15 DIAGNOSIS — M25532 Pain in left wrist: Secondary | ICD-10-CM

## 2020-12-15 DIAGNOSIS — N179 Acute kidney failure, unspecified: Secondary | ICD-10-CM | POA: Diagnosis not present

## 2020-12-15 DIAGNOSIS — M86172 Other acute osteomyelitis, left ankle and foot: Secondary | ICD-10-CM | POA: Diagnosis not present

## 2020-12-15 DIAGNOSIS — J9601 Acute respiratory failure with hypoxia: Secondary | ICD-10-CM

## 2020-12-15 DIAGNOSIS — I639 Cerebral infarction, unspecified: Secondary | ICD-10-CM

## 2020-12-15 LAB — CBC WITH DIFFERENTIAL/PLATELET
Abs Immature Granulocytes: 0 10*3/uL (ref 0.00–0.07)
Basophils Absolute: 0.2 10*3/uL — ABNORMAL HIGH (ref 0.0–0.1)
Basophils Relative: 1 %
Eosinophils Absolute: 0.2 10*3/uL (ref 0.0–0.5)
Eosinophils Relative: 1 %
HCT: 28.2 % — ABNORMAL LOW (ref 36.0–46.0)
Hemoglobin: 8.8 g/dL — ABNORMAL LOW (ref 12.0–15.0)
Lymphocytes Relative: 10 %
Lymphs Abs: 1.7 10*3/uL (ref 0.7–4.0)
MCH: 29.3 pg (ref 26.0–34.0)
MCHC: 31.2 g/dL (ref 30.0–36.0)
MCV: 94 fL (ref 80.0–100.0)
Monocytes Absolute: 0.5 10*3/uL (ref 0.1–1.0)
Monocytes Relative: 3 %
Neutro Abs: 14.5 10*3/uL — ABNORMAL HIGH (ref 1.7–7.7)
Neutrophils Relative %: 85 %
Platelets: 144 10*3/uL — ABNORMAL LOW (ref 150–400)
RBC: 3 MIL/uL — ABNORMAL LOW (ref 3.87–5.11)
RDW: 17.3 % — ABNORMAL HIGH (ref 11.5–15.5)
Smear Review: NORMAL
WBC: 17 10*3/uL — ABNORMAL HIGH (ref 4.0–10.5)
nRBC: 0.5 % — ABNORMAL HIGH (ref 0.0–0.2)

## 2020-12-15 LAB — BASIC METABOLIC PANEL
Anion gap: 8 (ref 5–15)
BUN: 53 mg/dL — ABNORMAL HIGH (ref 8–23)
CO2: 21 mmol/L — ABNORMAL LOW (ref 22–32)
Calcium: 8.7 mg/dL — ABNORMAL LOW (ref 8.9–10.3)
Chloride: 107 mmol/L (ref 98–111)
Creatinine, Ser: 3.85 mg/dL — ABNORMAL HIGH (ref 0.44–1.00)
GFR, Estimated: 12 mL/min — ABNORMAL LOW (ref >60)
Glucose, Bld: 115 mg/dL — ABNORMAL HIGH (ref 70–99)
Potassium: 3.9 mmol/L (ref 3.5–5.1)
Sodium: 136 mmol/L (ref 135–145)

## 2020-12-15 NOTE — Plan of Care (Signed)

## 2020-12-15 NOTE — Plan of Care (Signed)
  Problem: Clinical Measurements: Goal: Respiratory complications will improve Outcome: Progressing   

## 2020-12-15 NOTE — Progress Notes (Signed)
[facility]  PROGRESS NOTE   Subjective:    Patient was seen today on the second floor Patient is awake resting comfortably in the bed. Patient responding appropriately Patient offers no new specific physical complaints  Objective:  Vital signs in last 24 hours:  Temp:  [97.2 F (36.2 C)-98.1 F (36.7 C)] 98.1 F (36.7 C) (12/04 1635) Pulse Rate:  [72-97] 76 (12/04 1635) Resp:  [16-18] 18 (12/04 1635) BP: (130-155)/(63-94) 130/78 (12/04 1635) SpO2:  [95 %-99 %] 99 % (12/04 1635) Weight:  [100.4 kg] 100.4 kg (12/04 0500)  Weight change: 3.9 kg Filed Weights   12/11/20 0242 12/14/20 0300 12/15/20 0500  Weight: 96.3 kg 96.5 kg 100.4 kg    Intake/Output: I/O last 3 completed shifts: In: 1036.7 [P.O.:960; IV Piggyback:76.7] Out: 950 [Urine:950]   Intake/Output this shift:  No intake/output data recorded.  Physical Exam: General:  No acute distress, resting in bed  Head:  Normocephalic, atraumatic. Moist oral mucosal membranes  Lungs:   normal effort on room air, Decreased breath sounds at bases  Heart:  irregular  Abdomen:   Soft, nondistended  Extremities: Trace right lower extremity peripheral edema. Gauze dressing left foot, NPWV   Neurologic:  Awake, alert, following commands  Skin:  No lesions       Basic Metabolic Panel: Recent Labs  Lab 12/09/20 0617 12/10/20 0513 12/11/20 0556 12/12/20 0434 12/13/20 0536 12/14/20 0513 12/15/20 0536  NA 137  --   --  134* 137 136 136  K 4.6  --   --  4.3 4.0 4.2 3.9  CL 104  --   --  104 107 106 107  CO2 24  --   --  22 21* 18* 21*  GLUCOSE 104*  --   --  107* 88 100* 115*  BUN 53*  --   --  54* 55* 55* 53*  CREATININE 3.57*   < > 3.60* 3.74* 3.83* 3.70* 3.85*  CALCIUM 8.6*  --   --  8.6* 8.7* 8.5* 8.7*  MG  --   --   --   --  2.0  --   --    < > = values in this interval not displayed.    CBC: Recent Labs  Lab 12/11/20 0556 12/12/20 0434 12/12/20 1732 12/13/20 0536 12/14/20 0513  12/15/20 0536  WBC 2.8* 7.6  --  21.1* 23.5* 17.0*  NEUTROABS 0.3* 0.9*  --  13.9* 17.1* 14.5*  HGB 8.5* 8.1* 8.1* 8.2* 8.2* 8.8*  HCT 26.9* 25.9* 26.1* 26.4* 26.7* 28.2*  MCV 91.5 92.2  --  93.0 94.3 94.0  PLT 134* 127*  --  147* 154 144*     Urinalysis: No results for input(s): COLORURINE, LABSPEC, PHURINE, GLUCOSEU, HGBUR, BILIRUBINUR, KETONESUR, PROTEINUR, UROBILINOGEN, NITRITE, LEUKOCYTESUR in the last 72 hours.  Invalid input(s): APPERANCEUR    Imaging: No results found.   Medications:    sodium chloride 10 mL/hr at 12/05/20 0604   sodium chloride Stopped (12/06/20 1004)   sodium chloride 50 mL/hr at 12/15/20 0612   metronidazole 500 mg (12/15/20 0907)    (feeding supplement) PROSource Plus  30 mL Oral TID BM   allopurinol  50 mg Oral QODAY   apixaban  5 mg Oral BID   vitamin C  500 mg Oral BID   atorvastatin  40 mg Oral Daily   budesonide (PULMICORT) nebulizer solution  0.5 mg Nebulization BID   diltiazem  180 mg Oral Daily   doxycycline  100 mg  Oral Q12H   epoetin (EPOGEN/PROCRIT) injection  20,000 Units Subcutaneous Weekly   feeding supplement  1 Container Oral TID BM   ferrous gluconate  324 mg Oral Q breakfast   hydrALAZINE  100 mg Oral Q8H   levothyroxine  100 mcg Oral Q0600   mouth rinse  15 mL Mouth Rinse BID   metoprolol tartrate  50 mg Oral BID   multivitamin with minerals  1 tablet Oral Daily   pantoprazole  40 mg Oral BID   sodium chloride flush  3 mL Intravenous Q12H   venlafaxine  50 mg Oral BID    Assessment/ Plan:     Principal Problem:   Sepsis ([place]) Active Problems:   Atrial fibrillation with RVR (HCC)   Osteomyelitis of foot, left, acute (HCC)   CKD stage 4 due to type 2 diabetes mellitus ([place])   Liver cirrhosis secondary to NASH (nonalcoholic steatohepatitis) (HCC)   Hypertension   History of anemia due to CKD   Frequent falls   AKI (acute kidney injury) ([place])   Cellulitis and abscess of foot, except toes   Infectious  tenosynovitis   Wheeze   Atrial fibrillation with rapid ventricular response (HCC)   Bilateral carotid artery stenosis   Neutropenia (HCC)   Drug-induced neutropenia (HCC)   Normocytic anemia   Thrombocytopenia (HCC)   Acute hypoxemic respiratory failure (HCC)   Left wrist pain   Stroke [facility])  Autumn [name] is a 70 y.o. white female with hypertension, diabetes mellitus type II, peripheral vascular disease, sleep apnea, atrial fibrillation who is admitted to [facility] on 11/09/2020 for Atrial fibrillation with rapid ventricular response ([place]) [I48.91] AKI (acute kidney injury) ([place]) [N17.9] Osteomyelitis of foot, left, acute ([place]) [Q22.297] Atrial fibrillation with RVR ([place]) [I48.91] Worsening renal function [N28.9] Syncope, unspecified syncope type [R55]  Hospital course complicated by acute kidney injury and requiring amputation on 10/29 by Dr. Sherryle Lis. Patient with atrial fibrillation and acute CVA this admission.   #1: Acute kidney injury on chronic kidney disease stage IV with proteinuria: baseline creatinine of 3.19, GFR of 15 on 09/27/2020.   Patient has acute kidney injury on chronic kidney disease Patient had AKI secondary to ATN Patient lisinopril is now being held Patient has CKD stage IV. Patient has CKD going back to 2019. Patient has CKD most likely from diabetes mellitus. Patient baseline creatinine is around 3.2--3.6.    Patient 's  creatinine peaked at 5.2. Patient creatinine is now stable at 3.7--3.8    #2: Osteomyelitis: Patient was admitted with osteomyelitis. 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   Patient is currently being followed by podiatry/ID     #3: Anemia with chronic kidney disease:  Anemia of chronic disease Patient hemoglobin better at 8.8 Patient is on Epogen Patient did receive PRBC during this admission Patient also underwent GI work-up as well Status post EGD and colonoscopy.  Dr. Haig Prophet on November 29, 2020.   Patient was noted to have internal hemorrhoids, multiple polyps removed, diverticulosis in sigmoid and descending colon. We will continue EPO 20,000 units subcu weekly    #4: Diabetes mellitus type II with chronic kidney disease: insulin dependent. Hemoglobin A1c of 6.3% on November 09, 2020.   Glucose stable   #5Hypertension Patient blood pressure is at goal   Plan  We will continue current treatment plan for now Patient hemoglobin is  improving Patient creatinine is stable    LOS: [place] [facility] 12/4/20225:40 PM

## 2020-12-15 NOTE — Progress Notes (Signed)
PROGRESS NOTE    Autumn [name]  ZOX:[id number] DOB: 12-05-1950 DOA: 11/09/2020 PCP: Harlow Ohms, MD    Chief Complaint  Patient presents with   Fall    Brief Narrative:  Autumn [name] is a 70 y.o. 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  She'd been following with a [facility] podiatrist outpatient and had recently refused surgery.  She'd recently been treated with antibiotics at [facility] for the diabetic foot infection with enterobacter aerogans culture positive.  She was admitted on 10/29 due to falls at home with worsening L foot chronic diabetic ulcer with osteomyelitis of the 2nd and 3rd metatarsals and cellulitis noted on MRI.  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  Her post operative course was complicated by unresponsiveness and neurologic changes. She was found to have a stroke and was intubated for airway protection.  She was extubated 11/3 and transferred to [facility] service on 11/4.  Hospital course further complicated by acute on chronic anemia with melena after starting on anticoagulation, requiring blood transfusions and GI evaluation.    She developed severe neutropenia and this was attributed to IV Unasyn.  Antibiotics were adjusted and she was started on Neupogen.   She developed atrial fibrillation with RVR which is improved with rate control drugs (metoprolol and Cardizem).   Assessment & Plan:   Principal Problem:   Sepsis ([place]) Active Problems:   Atrial fibrillation with RVR (HCC)   Osteomyelitis of foot, left, acute ([place])   CKD stage 4 due to type 2 diabetes mellitus (HCC)   Liver cirrhosis secondary to NASH (nonalcoholic steatohepatitis) ([place])   Hypertension   History of anemia due to CKD   Frequent falls   AKI (acute kidney injury) ([place])   Cellulitis and abscess of foot, except toes    Infectious tenosynovitis   Wheeze   Atrial fibrillation with rapid ventricular response (HCC)   Bilateral carotid artery stenosis   Neutropenia (HCC)   Drug-induced neutropenia (HCC)   Normocytic anemia   Thrombocytopenia (HCC)  #1 septic shock secondary to left diabetic foot infection/left foot osteomyelitis and cellulitis/septic arthritis of second MTP joint -Patient status post amputation of the left second ray metatarsal, I&D deep abscess multiple fascial planes of the left foot on 11/12/2020. -Surgical wound cultures grew strep mitis/oralis/staff aureus/abundant bacteriology species and rare Pasteurella multocida -Patient was on IV Doxy and IV Flagyl. -Patient transition to oral antibiotics with improvement with nausea. -Podiatry and ID following and appreciate input and recommendations.  2.  Severe leukopenia/neutropenia -Felt secondary to antibiotics and antifungal medication. -Patient seen by hematology/oncology and antibiotics changed per ID recommendations. -Status post 5 days Granix. -Resolved.  3.  Recurrent melena -Patient noted to have black stools on 12/12/2020. -Patient noted to have a GI bleed earlier in the hospitalization and seen by GI and evaluated. -Black stools likely secondary to oral iron supplementation although stools were reported to be normal in color. -H&H stable. -Eliquis resumed with stabilization of hemoglobin. -If repeat ongoing bleeding may need a bleeding scan for further evaluation. -Currently stable.  4.  A. fib with RVR -Patient noted to have some chest pressure during this episode which is subsequently resolved. -High-sensitivity troponins unremarkable. -Currently rate controlled on metoprolol and Cardizem. -Continue Eliquis for anticoagulation.  5.  Hypertension -Metoprolol, Cardizem.  6.  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  fib. -MRI brain concerning for acute to early subacute infarction in the left posterior frontal cortical and subcortical brain possibly affecting precentral gyrus.  No mass-effect or hemorrhage.  Chronic small vessel ischemic changes noted. -Carotid ultrasound done concerning for severe carotid disease. -2D echo done with small patent PFO. -Patient noted to be in A. fib and currently on anticoagulation. -Patient seen in consultation by vascular surgery, MRA angiogram done of head and neck with lots of artifact noted due to motion. -Patient seen by vascular surgery underwent left carotid angiogram with the distalmost aspect of internal carotid artery as well as anterior cerebral middle cerebral artery.  Widely patent with no critical lesions noted. -PT/OT/ST. -On Eliquis for anticoagulation, statin, lifestyle modifications. -Continue dysphagia 3 diet.  7.  Acute kidney injury on chronic kidney disease stage IV with proteinuria -Baseline creatinine approximately 3.19 -Felt likely secondary to ATN in the setting of ACE inhibitor. -Creatinine went up as high as 5.2 and currently stabilized at 3.7. -Nephrology following.  8.  Anemia of chronic disease/acute GI bleed/melena -Hemoglobin stable at 8.8. -Status post transfusion 4 unit packed red blood cells during the hospitalization. -Patient seen in consultation by GI and underwent EGD, colonoscopy and noted to have internal hemorrhoids, multiple polyps which were removed, diverticulosis in the sigmoid and descending colon. -Currently on Epogen.  9.  Esophageal candidiasis -Noted on upper endoscopy. -Was on fluconazole and discontinued 12/09/2020 secondary to neutropenia. -No further treatment indicated at this time.  10.  Acute hypoxic respiratory failure, probable OSA -BiPAP nightly.  11.  Right hand/thumb pain and stiffness -Felt likely secondary to arthritis/gout. -Uric acid level at 6.8. -Plain films of the left breast with prominent  soft tissue swelling without acute osseous abnormalities, arthritis at first [facility] joint and STT interval. -Continue allopurinol. -Pain management.  12.  Acute on chronic diastolic CHF -2D echo with normal EF, mild LVH, severely dilated left atrium. -Improved currently stable.  13.  Aspiration pneumonia -Status post full course antibiotic treatment.  14.  Acute metabolic encephalopathy -Resolved.  15.  Obesity -Lifestyle modification outpatient follow-up with PCP.   DVT prophylaxis: Eliquis Code Status: Full Family Communication: Updated patient.  No family at bedside Disposition:   Status is: Inpatient  Remains inpatient appropriate because: Severity of illness       Consultants:  Cardiology: Dr. Oval Linsey 11/10/2020 Podiatry: Dr. Jacqualyn Posey 11/10/2020 Nephrology: Dr. Candiss Norse 11/10/2020 ID: Dr. Ramon Dredge 11/12/2020 Critical care medicine Dr. Mortimer Fries 11/13/2020 Neurology: Dr.Linzden 11/13/2020 Palliative care: Jordan Hawks, FNP 11/18/2020 Vascular surgery: Dr.Schnier 11/20/2020 Wound care Gastroenterology: Dr. Haig Prophet 11/26/2020 Hematology/oncology: Dr.Rao 12/08/2020   Procedures:  Upper endoscopy 11/27/2020-Per Dr. Haig Prophet gastroenterology Colonoscopy 11/29/2020 per Dr. Haig Prophet gastroenterology CT head 11/09/2020, 11/13/2020, 11/19/2020, 11/21/2020 Plain films of the left wrist 12/11/2020 Chest x-ray 11/21/2020 Abdominal films 11/15/2020, 11/17/2020 MRI brain 11/13/2020 MRI angiogram head and neck 11/19/2020 MRI left ankle 11/13/2020 MRI left foot 11/09/2020 Bilateral carotid ultrasound 11/19/2020 Renal ultrasound 11/09/2020 Left lower extremity Dopplers 11/09/2020 2D echo 11/10/2020 Aortic arch/selective cannulation of left common carotid/cervical imaging of the left carotid artery/intracranial imaging of left cerebral arterial system/[employer] right common femoral per Dr. Delana Meyer, vascular surgery 11/22/2020 Second ray amputation/I&D of the left foot with bone biopsy of  left third metatarsal Intubation 11/13/2020>>> extubation 11/14/2020 Transfusion 4 units packed red blood cells  Antimicrobials:  Anti-infectives (From admission, onward)    Start  Dose/Rate Route Frequency Ordered Stop   12/14/20 1800  metroNIDAZOLE (FLAGYL) IVPB 500 mg        500 mg 100 mL/hr over 60 Minutes Intravenous 2 times daily 12/14/20 0837     12/14/20 1000  doxycycline (VIBRA-TABS) tablet 100 mg        100 mg Oral Every 12 hours 12/14/20 0836     12/10/20 1200  vancomycin (VANCOREADY) IVPB 750 mg/150 mL  Status:  Discontinued        750 mg 150 mL/hr over 60 Minutes Intravenous Every 48 hours 12/08/20 1236 12/09/20 2017   12/10/20 1000  doxycycline (VIBRAMYCIN) 100 mg in sodium chloride 0.9 % 250 mL IVPB  Status:  Discontinued        100 mg 125 mL/hr over 120 Minutes Intravenous Every 12 hours 12/09/20 2017 12/14/20 0836   12/08/20 1400  metroNIDAZOLE (FLAGYL) IVPB 500 mg  Status:  Discontinued        500 mg 100 mL/hr over 60 Minutes Intravenous Every 12 hours 12/08/20 1231 12/14/20 0837   12/08/20 1400  ciprofloxacin (CIPRO) IVPB 400 mg  Status:  Discontinued        400 mg 200 mL/hr over 60 Minutes Intravenous Every 24 hours 12/08/20 1231 12/09/20 2017   12/08/20 1315  vancomycin (VANCOREADY) IVPB 2000 mg/400 mL        2,000 mg 200 mL/hr over 120 Minutes Intravenous  Once 12/08/20 1218 12/08/20 1506   12/05/20 1130  fluconazole (DIFLUCAN) tablet 200 mg  Status:  Discontinued        200 mg Oral Daily 12/05/20 1046 12/09/20 0725   11/30/20 1400  fluconazole (DIFLUCAN) IVPB 200 mg  Status:  Discontinued        200 mg 100 mL/hr over 60 Minutes Intravenous Every 24 hours 11/29/20 1633 12/05/20 1044   11/29/20 1800  fluconazole (DIFLUCAN) IVPB 400 mg        400 mg 100 mL/hr over 120 Minutes Intravenous  Once 11/29/20 1633 11/30/20 0724   11/29/20 1700  fluconazole (DIFLUCAN) IVPB 200 mg  Status:  Discontinued        200 mg 100 mL/hr over 60 Minutes Intravenous Every 24  hours 11/29/20 1611 11/29/20 1633   11/23/20 0000  ceFAZolin (ANCEF) IVPB 1 g/50 mL premix       Note to Pharmacy: To be given in specials   1 g 100 mL/hr over 30 Minutes Intravenous  Once 11/22/20 1106 11/22/20 1750   11/14/20 2200  Ampicillin-Sulbactam (UNASYN) 3 g in sodium chloride 0.9 % 100 mL IVPB  Status:  Discontinued        3 g 200 mL/hr over 30 Minutes Intravenous Every 12 hours 11/14/20 1610 12/08/20 1143   11/14/20 0900  piperacillin-tazobactam (ZOSYN) IVPB 2.25 g  Status:  Discontinued        2.25 g 100 mL/hr over 30 Minutes Intravenous Every 8 hours 11/14/20 0805 11/14/20 1609   11/13/20 2200  piperacillin-tazobactam (ZOSYN) IVPB 3.375 g  Status:  Discontinued        3.375 g 12.5 mL/hr over 240 Minutes Intravenous Every 12 hours 11/13/20 0758 11/14/20 0805   11/13/20 0600  piperacillin-tazobactam (ZOSYN) IVPB 2.25 g        2.25 g 100 mL/hr over 30 Minutes Intravenous Every 8 hours 11/12/20 2155 11/13/20 1725   11/11/20 1600  vancomycin (VANCOCIN) IVPB 1000 mg/200 mL premix  Status:  Discontinued        1,000 mg 200 mL/hr over 60 Minutes  Intravenous Every 48 hours 11/09/20 1618 11/10/20 1045   11/11/20 1558  vancomycin (VANCOCIN) powder  Status:  Discontinued          As needed 11/11/20 1558 11/11/20 1558   11/10/20 2000  vancomycin (VANCOCIN) IVPB 1000 mg/200 mL premix  Status:  Discontinued        1,000 mg 200 mL/hr over 60 Minutes Intravenous Every 48 hours 11/10/20 1918 11/12/20 0940   11/09/20 1800  ceFEPIme (MAXIPIME) 2 g in sodium chloride 0.9 % 100 mL IVPB  Status:  Discontinued        2 g 200 mL/hr over 30 Minutes Intravenous Every 24 hours 11/09/20 1619 11/12/20 2146   11/09/20 1619  vancomycin variable dose per unstable renal function (pharmacist dosing)  Status:  Discontinued         Does not apply See admin instructions 11/09/20 1619 11/12/20 1521   11/09/20 1615  vancomycin (VANCOREADY) IVPB 1750 mg/350 mL        1,750 mg 175 mL/hr over 120 Minutes  Intravenous  Once 11/09/20 1606 11/09/20 1947   11/09/20 1600  metroNIDAZOLE (FLAGYL) IVPB 500 mg  Status:  Discontinued        500 mg 100 mL/hr over 60 Minutes Intravenous Every 8 hours 11/09/20 1555 11/12/20 2146         Subjective: Patient laying in bed denies any chest pain.  No shortness of breath.  No abdominal pain.  Overall feeling well.  Tolerated some of her breakfast.  Watching television.  Objective: Vitals:   12/15/20 0439 12/15/20 0500 12/15/20 0809 12/15/20 0833  BP: 131/63  (!) 141/86   Pulse: 72  91 93  Resp: 16  18 16   Temp: 97.8 F (36.6 C)  (!) 97.5 F (36.4 C)   TempSrc:      SpO2: 96%  95% 97%  Weight:  100.4 kg    Height:        Intake/Output Summary (Last 24 hours) at 12/15/2020 1200 Last data filed at 12/15/2020 0500 Gross per 24 hour  Intake 556.67 ml  Output 650 ml  Net -93.33 ml   Filed Weights   12/11/20 0242 12/14/20 0300 12/15/20 0500  Weight: 96.3 kg 96.5 kg 100.4 kg    Examination:  General exam: Appears calm and comfortable  Respiratory system: Clear to auscultation bilaterally, no wheezes, no crackles, no rhonchi.  Fair air movement.  Speaking in full sentences.Marland Kitchen Respiratory effort normal. Cardiovascular system: S1 & S2 heard, RRR. No JVD, murmurs, rubs, gallops or clicks. No pedal edema. Gastrointestinal system: Abdomen is nondistended, soft and nontender. No organomegaly or masses felt. Normal bowel sounds heard. Central nervous system: Alert and oriented. No focal neurological deficits. Extremities: Left foot bandaged, status post left second toe amputation.  Symmetric 5 x 5 power. Skin: No rashes, lesions or ulcers Psychiatry: Judgement and insight appear normal. Mood & affect appropriate.     Data Reviewed: I have personally reviewed following labs and imaging studies  CBC: Recent Labs  Lab 12/11/20 0556 12/12/20 0434 12/12/20 1732 12/13/20 0536 12/14/20 0513 12/15/20 0536  WBC 2.8* 7.6  --  21.1* 23.5* 17.0*   NEUTROABS 0.3* 0.9*  --  13.9* 17.1* 14.5*  HGB 8.5* 8.1* 8.1* 8.2* 8.2* 8.8*  HCT 26.9* 25.9* 26.1* 26.4* 26.7* 28.2*  MCV 91.5 92.2  --  93.0 94.3 94.0  PLT 134* 127*  --  147* 154 144*    Basic Metabolic Panel: Recent Labs  Lab 12/09/20 0617 12/10/20 0513 12/11/20  7616 12/12/20 0434 12/13/20 0536 12/14/20 0513 12/15/20 0536  NA 137  --   --  134* 137 136 136  K 4.6  --   --  4.3 4.0 4.2 3.9  CL 104  --   --  104 107 106 107  CO2 24  --   --  22 21* 18* 21*  GLUCOSE 104*  --   --  107* 88 100* 115*  BUN 53*  --   --  54* 55* 55* 53*  CREATININE 3.57*   < > 3.60* 3.74* 3.83* 3.70* 3.85*  CALCIUM 8.6*  --   --  8.6* 8.7* 8.5* 8.7*  MG  --   --   --   --  2.0  --   --    < > = values in this interval not displayed.    GFR: Estimated Creatinine Clearance: 16.7 mL/min (A) (by C-G formula based on SCr of 3.85 mg/dL (H)).  Liver Function Tests: Recent Labs  Lab 12/13/20 0536  AST 23  ALT 11  ALKPHOS 138*  BILITOT 0.6  PROT 5.4*  ALBUMIN 2.4*    CBG: No results for input(s): GLUCAP in the last 168 hours.   No results found for this or any previous visit (from the past 240 hour(s)).       Radiology Studies: No results found.      Scheduled Meds:  (feeding supplement) PROSource Plus  30 mL Oral TID BM   allopurinol  50 mg Oral QODAY   apixaban  5 mg Oral BID   vitamin C  500 mg Oral BID   atorvastatin  40 mg Oral Daily   budesonide (PULMICORT) nebulizer solution  0.5 mg Nebulization BID   diltiazem  180 mg Oral Daily   doxycycline  100 mg Oral Q12H   epoetin (EPOGEN/PROCRIT) injection  20,000 Units Subcutaneous Weekly   feeding supplement  1 Container Oral TID BM   ferrous gluconate  324 mg Oral Q breakfast   hydrALAZINE  100 mg Oral Q8H   levothyroxine  100 mcg Oral Q0600   mouth rinse  15 mL Mouth Rinse BID   metoprolol tartrate  50 mg Oral BID   multivitamin with minerals  1 tablet Oral Daily   pantoprazole  40 mg Oral BID   sodium chloride  flush  3 mL Intravenous Q12H   venlafaxine  50 mg Oral BID   Continuous Infusions:  sodium chloride 10 mL/hr at 12/05/20 0604   sodium chloride Stopped (12/06/20 1004)   sodium chloride 50 mL/hr at 12/15/20 0612   metronidazole 500 mg (12/15/20 0907)     LOS: 36 days    Time spent: 40 minutes    Irine Seal, MD [facility]   To contact the attending provider between 7A-7P or the covering provider during after hours 7P-7A, please log into the web site [email or web address] and access using universal [place] password for that web site. If you do not have the password, please call the hospital operator.  12/15/2020, 12:00 PM

## 2020-12-16 LAB — CBC WITH DIFFERENTIAL/PLATELET
Abs Immature Granulocytes: 1.38 10*3/uL — ABNORMAL HIGH (ref 0.00–0.07)
Basophils Absolute: 0.1 10*3/uL (ref 0.0–0.1)
Basophils Relative: 1 %
Eosinophils Absolute: 0.1 10*3/uL (ref 0.0–0.5)
Eosinophils Relative: 0 %
HCT: 27.3 % — ABNORMAL LOW (ref 36.0–46.0)
Hemoglobin: 8.4 g/dL — ABNORMAL LOW (ref 12.0–15.0)
Immature Granulocytes: 9 %
Lymphocytes Relative: 11 %
Lymphs Abs: 1.7 10*3/uL (ref 0.7–4.0)
MCH: 28.9 pg (ref 26.0–34.0)
MCHC: 30.8 g/dL (ref 30.0–36.0)
MCV: 93.8 fL (ref 80.0–100.0)
Monocytes Absolute: 1.3 10*3/uL — ABNORMAL HIGH (ref 0.1–1.0)
Monocytes Relative: 9 %
Neutro Abs: 11.2 10*3/uL — ABNORMAL HIGH (ref 1.7–7.7)
Neutrophils Relative %: 70 %
Platelets: 134 10*3/uL — ABNORMAL LOW (ref 150–400)
RBC: 2.91 MIL/uL — ABNORMAL LOW (ref 3.87–5.11)
RDW: 17.2 % — ABNORMAL HIGH (ref 11.5–15.5)
Smear Review: NORMAL
WBC: 15.8 10*3/uL — ABNORMAL HIGH (ref 4.0–10.5)
nRBC: 0.4 % — ABNORMAL HIGH (ref 0.0–0.2)

## 2020-12-16 LAB — RENAL FUNCTION PANEL
Albumin: 2.6 g/dL — ABNORMAL LOW (ref 3.5–5.0)
Anion gap: 9 (ref 5–15)
BUN: 53 mg/dL — ABNORMAL HIGH (ref 8–23)
CO2: 19 mmol/L — ABNORMAL LOW (ref 22–32)
Calcium: 8.6 mg/dL — ABNORMAL LOW (ref 8.9–10.3)
Chloride: 110 mmol/L (ref 98–111)
Creatinine, Ser: 3.7 mg/dL — ABNORMAL HIGH (ref 0.44–1.00)
GFR, Estimated: 13 mL/min — ABNORMAL LOW (ref >60)
Glucose, Bld: 107 mg/dL — ABNORMAL HIGH (ref 70–99)
Phosphorus: 3.4 mg/dL (ref 2.5–4.6)
Potassium: 4 mmol/L (ref 3.5–5.1)
Sodium: 138 mmol/L (ref 135–145)

## 2020-12-16 NOTE — Progress Notes (Signed)
PT Cancellation Note  Patient Details Name: Autumn [name] MRN: [id number] DOB: 1950/11/03   Cancelled Treatment:    Reason Eval/Treat Not Completed: Other (comment).  Therapist charted reviewed and attempted to see pt.  Pt's post-op shoe not present in room at this time.  Pt also refused therapy due to nausea and just starting to feel better.  Pt will re-attempt at later date as medically appropriate along with post-op shoe being present in room.   Gwenlyn Saran, PT, DPT 12/16/20, 3:40 PM

## 2020-12-16 NOTE — Progress Notes (Signed)
Speech Language Pathology Treatment:    Patient Details Name: Autumn [name] MRN: [id number] DOB: [birth year] Today's Date: 12/16/2020 Time: 3254-9826 SLP Time Calculation (min) (ACUTE ONLY): 15 min  Assessment / Plan / Recommendation Clinical Impression  Pt seen for skilled SLP services targeting speech intelligibility in setting of stroke. Upon SLP entrance to room, pt in semi-reclined position in bed. Pt with some SOB which pt attributes to recent repositioning by nursing staff. Pt with decreased breath support with pt stating 3-4 words per breathgroup. Breath support "trailing off" and inadequate for last word(s) of phrases/sentences. SLP repositioned pt to upright at ~70 degrees. Marked improvement in breath support with pt stating 7+ words per breathgroup and pt able to maintain breath support for duration of lengthy sentences. Reviewed SLOP strategies for improved speech intelligibility. Pt able to demonstrate after review with min verbal cues at the conversation level. Pt's speech continues with minimal articulatory imprecision; however, intelligibility improved with adequate breath support/positioning and slowed speech rate. Attempted cognitive-linguistic tx targeting working and short term memory, and pt politely declined stating "I don't feel up to it today."  Pt educated re: SLP progress to date and SLP POC including continuation of skilled SLP at services at d/c. Noted pt with pending d/c to SNF. Pt eager to d/c to SNF to continue rehabilitation process.   Recommend continuation of SLP services while in house and at d/c targeting functional cognitive-linguistic ability and speech intelligibility.   SLP to continue to f/u per SLP POC.     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  She'd been following with a [facility] podiatrist outpatient and had recently refused surgery.   She'd recently been treated with antibiotics at [facility] for the diabetic foot infection with enterobacter aerogans culture positive.  She was admitted on 10/29 due to falls at home with worsening L foot chronic diabetic ulcer with osteomyelitis of the 2nd and 3rd metatarsals and cellulitis noted on MRI.  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  Her post operative course was complicated by unresponsiveness and neurologic changes on 11/13/2021. She was found to have a stroke (MRI Brain showed acute to early subacute infarction in the left posterior frontal cortical and subcortical brain, possibly affecting the precentral gyrus) and was intubated for airway protection.  She was extubated 11/3 and transferred to [facility] service on 11/4.      SLP Plan  Continue with current plan of care      Recommendations for follow up therapy are one component of a multi-disciplinary discharge planning process, led by the attending physician.  Recommendations may be updated based on patient status, additional functional criteria and insurance authorization.    Recommendations       Follow Up Recommendations: Skilled nursing-short term rehab (<3 hours/day) Assistance recommended at discharge: Intermittent Supervision/Assistance SLP Visit Diagnosis: Dysarthria and anarthria (R47.1);Cognitive communication deficit (E15.830) Plan: Continue with current plan of care       Cherrie Gauze, M.S., [facility] [phone] [place])                 Quintella Baton  12/16/2020, 2:29 PM

## 2020-12-16 NOTE — Progress Notes (Signed)
[facility]  PROGRESS NOTE   Subjective:   Patient resting comfortably, breakfast tray on table. Continues to complain of nausea Denies shortness of breath  IV fluids remain at 50 MLS/hour  Objective:  Vital signs in last 24 hours:  Temp:  [97.8 F (36.6 C)-98.7 F (37.1 C)] 97.8 F (36.6 C) (12/05 1122) Pulse Rate:  [74-102] 74 (12/05 1122) Resp:  [14-20] 20 (12/05 1122) BP: (126-140)/(78-84) 136/78 (12/05 1122) SpO2:  [95 %-99 %] 97 % (12/05 1122) Weight:  [97.9 kg] 97.9 kg (12/05 0407)  Weight change: -2.5 kg Filed Weights   12/14/20 0300 12/15/20 0500 12/16/20 0407  Weight: 96.5 kg 100.4 kg 97.9 kg    Intake/Output: I/O last 3 completed shifts: In: -  Out: 650 [Urine:650]   Intake/Output this shift:  No intake/output data recorded.  Physical Exam: General:  No acute distress, resting in bed  Head:  Normocephalic, atraumatic. Moist oral mucosal membranes  Lungs:   normal effort on room air, clear bilaterally  Heart:  irregular  Abdomen:   Soft, nondistended  Extremities: Trace right lower extremity peripheral edema. Gauze dressing left foot, NPWV   Neurologic:  Awake, alert, following commands  Skin:  No lesions       Basic Metabolic Panel: Recent Labs  Lab 12/12/20 0434 12/13/20 0536 12/14/20 0513 12/15/20 0536 12/16/20 0402  NA 134* 137 136 136 138  K 4.3 4.0 4.2 3.9 4.0  CL 104 107 106 107 110  CO2 22 21* 18* 21* 19*  GLUCOSE 107* 88 100* 115* 107*  BUN 54* 55* 55* 53* 53*  CREATININE 3.74* 3.83* 3.70* 3.85* 3.70*  CALCIUM 8.6* 8.7* 8.5* 8.7* 8.6*  MG  --  2.0  --   --   --   PHOS  --   --   --   --  3.4     CBC: Recent Labs  Lab 12/12/20 0434 12/12/20 1732 12/13/20 0536 12/14/20 0513 12/15/20 0536 12/16/20 0402  WBC 7.6  --  21.1* 23.5* 17.0* 15.8*  NEUTROABS 0.9*  --  13.9* 17.1* 14.5* 11.2*  HGB 8.1* 8.1* 8.2* 8.2* 8.8* 8.4*  HCT 25.9* 26.1* 26.4* 26.7* 28.2* 27.3*  MCV 92.2  --  93.0 94.3 94.0 93.8  PLT 127*   --  147* 154 144* 134*      Urinalysis: No results for input(s): COLORURINE, LABSPEC, PHURINE, GLUCOSEU, HGBUR, BILIRUBINUR, KETONESUR, PROTEINUR, UROBILINOGEN, NITRITE, LEUKOCYTESUR in the last 72 hours.  Invalid input(s): APPERANCEUR    Imaging: No results found.   Medications:    sodium chloride 10 mL/hr at 12/05/20 0604   sodium chloride Stopped (12/06/20 1004)   metronidazole 500 mg (12/16/20 0816)    (feeding supplement) PROSource Plus  30 mL Oral TID BM   allopurinol  50 mg Oral QODAY   apixaban  5 mg Oral BID   vitamin C  500 mg Oral BID   atorvastatin  40 mg Oral Daily   budesonide (PULMICORT) nebulizer solution  0.5 mg Nebulization BID   diltiazem  180 mg Oral Daily   doxycycline  100 mg Oral Q12H   epoetin (EPOGEN/PROCRIT) injection  20,000 Units Subcutaneous Weekly   feeding supplement  1 Container Oral TID BM   ferrous gluconate  324 mg Oral Q breakfast   hydrALAZINE  100 mg Oral Q8H   levothyroxine  100 mcg Oral Q0600   mouth rinse  15 mL Mouth Rinse BID   metoprolol tartrate  50 mg Oral BID  multivitamin with minerals  1 tablet Oral Daily   pantoprazole  40 mg Oral BID   sodium chloride flush  3 mL Intravenous Q12H   venlafaxine  50 mg Oral BID    Assessment/ Plan:     Principal Problem:   Sepsis ([place]) Active Problems:   Atrial fibrillation with RVR (HCC)   Osteomyelitis of foot, left, acute (HCC)   CKD stage 4 due to type 2 diabetes mellitus (HCC)   Liver cirrhosis secondary to NASH (nonalcoholic steatohepatitis) (HCC)   Hypertension   History of anemia due to CKD   Frequent falls   AKI (acute kidney injury) ([place])   Cellulitis and abscess of foot, except toes   Infectious tenosynovitis   [place]   Atrial fibrillation with rapid ventricular response (HCC)   Bilateral carotid artery stenosis   Neutropenia (HCC)   Drug-induced neutropenia (HCC)   Normocytic anemia   Thrombocytopenia (HCC)   Acute hypoxemic respiratory failure (HCC)   Left  wrist pain   Stroke [facility])  Autumn [name] is a 70 y.o. white female with hypertension, diabetes mellitus type II, peripheral vascular disease, sleep apnea, atrial fibrillation who is admitted to [facility] on 11/09/2020 for Atrial fibrillation with rapid ventricular response ([place]) [I48.91] AKI (acute kidney injury) ([place]) [N17.9] Osteomyelitis of foot, left, acute ([place]) [N05.397] Atrial fibrillation with RVR ([place]) [I48.91] Worsening renal function [N28.9] Syncope, unspecified syncope type [R55]  Hospital course complicated by acute kidney injury and requiring amputation on 10/29 by Dr. Sherryle Lis. Patient with atrial fibrillation and acute CVA this admission.   #1: Acute kidney injury on chronic kidney disease stage IV with proteinuria: baseline creatinine of 3.19, GFR of 15 on 09/27/2020. Chronic kidney disease secondary to diabetes. Followed by Dr. Radene Knee, [facility]. Holding lisinopril -restarting as outpatient can be considered Acute kidney injury secondary to poor oral intake -Very little improvement in creatinine since initiation of IV fluids.  Therefore we will discontinue IV fluids and encourage oral intake with antiemetics if needed. -We will monitor for need to reinitiate diuretics   #2: Osteomyelitis: status post left 2nd metatarsal amputation.  Culture shows MSSA/strep mitis/bacteroids and Pasteurella.  Continue doxycycline and metronidazole for antibiotic therapy. Appreciate ID recommendations   #3: Anemia with chronic kidney disease: status post PRBC transfusions. Appreciate GI input. Status post EGD and colonoscopy.  Dr. Haig Prophet on November 29, 2020. Noted to have internal hemorrhoids, multiple polyps removed, diverticulosis in sigmoid and descending colon. -Hemoglobin 8.4.  EPO 20,000 units subcu weekly started last week   #4: Diabetes mellitus type II with chronic kidney disease: insulin dependent. Hemoglobin A1c of 6.3% on November 09, 2020.      LOS: [facility] 12/5/20222:35 PM

## 2020-12-16 NOTE — Progress Notes (Signed)
Occupational Therapy Treatment Patient Details Name: Autumn [name] MRN: [id number] DOB: [birth year] Today's Date: 12/16/2020   History of present illness 70 yo F admitted 10/29 s/p falls at home, worsening left foot chronic diabetic ulcer with osteomyelitis 2nd+3rd metatarsals and cellulitis noted on MRI, s/p L foot amputation of the 2nd metatarsal and toe, I&D deep abscess multiple fascial planes, and bone biopsy open deep third metatarsal on 10/31. Other comorbidities include sepsis without shock secondary to osteomyelitis and cellulitis left foot (IV zosyn), AKI on CKD, rapid afib (Cardizem + Heparin gtt), NSTEMI suspect demand ischemia, anemia acute on chronic. Transferred to ICU and intubated 11/2 after evolving neuro changes with unresponsiveness after coughing episode. 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   OT comments  Ms. Grillo seen for OT treatment on this date. Upon arrival to room pt awake/alert and reporting 8/10 nausea. Pt lying in bed. Pt agreeable to tx but refusing EOB/OOB.  Pt MOD I for self-drinking, but unable to open drink 2/2 limited fine motor skills. Pt instructed/led through adaptive strategies to open drink. Unsuccessful at using pincer grasp to open container. Unsucessful at using gross grasp for opening plastic utensils to be used as AE to open container. OT obtained plastic utensil and pt able to use gross grasp to hold utensil and puncture container lid for self-drinking. Pt return demonstrated instruction provided. Pt making limited progress toward goals, plan for EOB next session. RN notified post-op shoe not in room. Pt continues to benefit from skilled OT services to maximize return to PLOF and minimize risk of future falls, injury, caregiver burden, and readmission. Will continue to follow POC. Discharge recommendation remains appropriate.      Recommendations for follow up therapy are one component of a multi-disciplinary discharge planning process, led by the attending physician.  Recommendations may be updated based on patient status, additional functional criteria and insurance authorization.    Follow Up Recommendations  Skilled nursing-short term rehab (<3 hours/day)    Assistance Recommended at Discharge Frequent or constant Supervision/Assistance  Equipment Recommendations  Other (comment) (defer to next venue of care)    Recommendations for Other Services      Precautions / Restrictions Precautions Precautions: Fall Restrictions Weight Bearing Restrictions: Yes LLE Weight Bearing: Weight bearing as tolerated (in post op shoe)       Mobility Bed Mobility               General bed mobility comments: Pt refused EOB/OOB mobility    Transfers                   General transfer comment: Pt unwilling     Balance                                           ADL either performed or assessed with clinical judgement   ADL Overall ADL's : Needs assistance/impaired                                       General ADL Comments: Pt refused EOB and OOB 2/2 nausea. Pt MOD A for self-drinking, difficulty with opening container. Unsuccessful at using pincer grasp to open container. Unsucessful at using gross grasp for  opening plastic utensils to be used as AE to open container. OT obtained plastic utensil and pt able to use gross grasp to hold utensil and puncture container lid for self-drinking.    Extremity/Trunk Assessment              Vision       Perception     Praxis      Cognition Arousal/Alertness: Awake/alert Behavior During Therapy: WFL for tasks assessed/performed Overall Cognitive Status: Within Functional Limits for tasks assessed                                            Exercises Other Exercises Other Exercises: Pt educ re:  OT role, importance of movement for functional mobility, adaptive strategies for AE use Other Exercises: Therapeutic listening, opening cranberry juice w/ 3 trials for successful self feeding   Shoulder Instructions       General Comments      Pertinent Vitals/ Pain       Pain Assessment: No/denies pain  Home Living                                          Prior Functioning/Environment              Frequency           Progress Toward Goals  OT Goals(current goals can now be found in the care plan section)  Progress towards OT goals: Progressing toward goals  Acute Rehab OT Goals OT Goal Formulation: With patient Time For Goal Achievement: 12/26/20 Potential to Achieve Goals: Good ADL Goals Pt Will Perform Grooming: sitting;with set-up Pt Will Perform Lower Body Dressing: with mod assist;sitting/lateral leans Pt Will Transfer to Toilet: squat pivot transfer;bedside commode;with max assist Pt/caregiver will Perform Home Exercise Program: Increased ROM;Increased strength;Both right and left upper extremity;With minimal assist  Plan Discharge plan remains appropriate;Frequency needs to be updated    Co-evaluation                 AM-PAC OT "6 Clicks" Daily Activity     Outcome Measure   Help from another person eating meals?: A Lot Help from another person taking care of personal grooming?: A Little Help from another person toileting, which includes using toliet, bedpan, or urinal?: A Lot Help from another person bathing (including washing, rinsing, drying)?: A Lot Help from another person to put on and taking off regular upper body clothing?: A Lot Help from another person to put on and taking off regular lower body clothing?: A Lot 6 Click Score: 13    End of Session    OT Visit Diagnosis: Other abnormalities of gait and mobility (R26.89);Hemiplegia and hemiparesis;Muscle weakness (generalized) (M62.81)   Activity Tolerance Patient  limited by fatigue;Other (comment) (Pt limited by 8/10 nausea)   Patient Left in bed;with call bell/phone within reach;with bed alarm set   Nurse Communication Mobility status;Other (comment) (dicussed trying to locate post-op boot)        Time: 1056-1110 OT Time Calculation (min): 14 min  Charges: OT General Charges $OT Visit: 1 Visit OT Treatments $Self Care/Home Management : 8-22 mins  Nino Glow, Markus Daft 12/16/2020, 12:47 PM

## 2020-12-16 NOTE — Progress Notes (Signed)
PROGRESS NOTE    Autumn [name]   PYP:[id number]  DOB: 06-14-1950  PCP: Harlow Ohms, MD    DOA: 11/09/2020 LOS: 62    Brief Narrative / Hospital Course to Date:   ORTHA METTS is a 70 y.o. 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  She'd been following with a [facility] podiatrist outpatient and had recently refused surgery.  She'd recently been treated with antibiotics at [facility] for the diabetic foot infection with enterobacter aerogans culture positive.  She was admitted on 10/29 due to falls at home with worsening L foot chronic diabetic ulcer with osteomyelitis of the 2nd and 3rd metatarsals and cellulitis noted on MRI.  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  Her post operative course was complicated by unresponsiveness and neurologic changes. She was found to have a stroke and was intubated for airway protection.  She was extubated 11/3 and transferred to [facility] service on 11/4.  Hospital course further complicated by acute on chronic anemia with melena after starting on anticoagulation, requiring blood transfusions and GI evaluation.    She developed severe neutropenia and this was attributed to IV Unasyn.  Antibiotics were adjusted and she was started on Neupogen.  She developed atrial fibrillation with RVR which is improved with rate control drugs (metoprolol and Cardizem).  Assessment & Plan   Principal Problem:   Sepsis ([place]) Active Problems:   Atrial fibrillation with RVR (HCC)   Osteomyelitis of foot, left, acute ([place])   CKD stage 4 due to type 2 diabetes mellitus ([place])   Liver cirrhosis secondary to NASH (nonalcoholic steatohepatitis) ([place])   Hypertension   History of anemia due to CKD   Frequent falls   AKI (acute kidney injury) ([place])   Cellulitis and abscess of foot, except toes   Infectious  tenosynovitis   Wheeze   Atrial fibrillation with rapid ventricular response (HCC)   Bilateral carotid artery stenosis   Neutropenia (HCC)   Drug-induced neutropenia (HCC)   Normocytic anemia   Thrombocytopenia (HCC)   Acute hypoxemic respiratory failure (HCC)   Left wrist pain   Stroke (HCC)   Septic shock from left diabetic foot infection, left foot osteomyelitis and cellulitis, septic arthritis of second MTP joint: S/p amputation ray second left metatarsal, I&D deep abscess multiple fascial planes of the left foot on 11/12/2020.  Of note, surgical wound culture showed strep mitis/oralis, staff aureus, abundant Bacteroides species and rare Pasteurella multocida. --Abx: doxycycline and Flagyl through 12/12 --Transitioned to PO antibiotics on 12/3 afternoon --Follow-up ID recommendations --Podiatry reassessed - see recs for wound care and follow up --Post-op shoe ordered - need to obtain and work with PT in shoe prior to d/c to ensure fit    Severe leukopenia/neutropenia: neurtropenia is improving. Hematology consulted.   Suspect due to Abx and/or diflucan Completed 5 days Granix on 12/1.    Recurrent Melena - Resolved.   Occurred on 12/1 afternoon, pt noted to have black stools.  GI bleeding earlier this admission and GI evaluation as outlined.  Possibly due to oral iron supplement although stools had reportedly been normal in color. 12/2-3: Hemoglobin remaining stable --Resumed Eliquis and Hbg stable --Monitor closely.   --Bleeding scan if active bleeding.   Atrial fibrillation with RVR, - HR's controlled. Had chest pressure during episode, resolved.  Troponin was  unremarkable. --Rate control with cardizem and metoprolol --Continue Eliquis (held due to recurrent melena)   Hypertension --Continue Cardizem, metoprolol.     Acute stroke with expressive aphasia and dysphagia: --Continue Lipitor and Eliquis.   --Continue dysphagia 3 diet.   Acute GI bleeding/melena on  11/26/2020: EGD on 11/28/2018 showed just an esophageal mucosa, gastritis, erythematous duodenopathy.  Colonoscopy 11/29/2020 showed multiple polyps that were removed but the colon prep was poor.  Continue Protonix.   Esophageal candidiasis: Fluconazole was discontinued on 12/09/2020 because of neutropenia.  No further treatment indicated.  Monitor.   Right hand/thumb pain and stiffness: Probably from arthritis/gout.   Uric acid was 6.8. Left wrist x-ray on 11/30 showed prominent soft tissue swelling without acute osseous abnormalities, arthritis at the first [facility] joint and STT interval. --Continue allopurinol.   --Analgesics as needed.     Acute hypoxic respiratory failure, probable OSA: Continue BiPAP at night   Acute on chronic diastolic CHF: 2D echo showed normal EF, mild LVH, severely dilated left atrium, indeterminate LV diastolic parameters.    Aspiration pneumonia: Completed treatment   Acute metabolic encephalopathy - resolved --Delirium precautions  Thrombocytopenia: Improving.  Monitor CBC.  CKD stage IV - Nephrology following. Now with Cr slightly worsening in setting of poor PO intake. On gentle IV hydration. Monitor BMP.   Other comorbidities include NASH liver cirrhosis, hypothyroidism, type II DM    Obesity: Body mass index is 34.84 kg/m.  Complicates overall care and prognosis.  Recommend lifestyle modifications including physical activity and diet for weight loss and overall long-term health.   DVT prophylaxis: Place and maintain sequential compression device Start: 11/15/20 1527 apixaban (ELIQUIS) tablet 5 mg   Diet:  Diet Orders (From admission, onward)     Start     Ordered   12/11/20 1322  Diet regular Room service appropriate? Yes; Fluid consistency: Thin  Diet effective now       Question Answer Comment  Room service appropriate? Yes   Fluid consistency: Thin      12/11/20 1321              Code Status: Full Code   Subjective 12/16/20     Pt denies having nausea this AM. Doesn't think the oral antibiotics have bothered her yet.  Overall feels well.  Anxious about going to a new place soon.  Appetite fair.  No other acute complaints.    Disposition Plan & Communication   Status is: Inpatient  Remains inpatient appropriate because: Had recurrent melena, monitoring hemoglobin with resuming Eliquis, worsened renal function.   Consults, Procedures, Significant Events   Consultants:  Gastroenterology PCCM Neurology Infectious disease Hematology   Procedures:  Intubation on 11/13/2020 EGD and colonoscopy  Arch aortogram, selective cannulation of the left common carotid artery Amputation ray second metatarsal and, I&D deep abscess multiple fascial planes, bone biopsy third metatarsal  Antimicrobials:  Anti-infectives (From admission, onward)    Start     Dose/Rate Route Frequency Ordered Stop   12/14/20 1800  metroNIDAZOLE (FLAGYL) IVPB 500 mg        500 mg 100 mL/hr over 60 Minutes Intravenous 2 times daily 12/14/20 0837     12/14/20 1000  doxycycline (VIBRA-TABS) tablet 100 mg        100 mg Oral Every 12 hours 12/14/20 0836     12/10/20 1200  vancomycin (VANCOREADY) IVPB 750 mg/150 mL  Status:  Discontinued        750 mg 150 mL/hr over 60 Minutes Intravenous  Every 48 hours 12/08/20 1236 12/09/20 2017   12/10/20 1000  doxycycline (VIBRAMYCIN) 100 mg in sodium chloride 0.9 % 250 mL IVPB  Status:  Discontinued        100 mg 125 mL/hr over 120 Minutes Intravenous Every 12 hours 12/09/20 2017 12/14/20 0836   12/08/20 1400  metroNIDAZOLE (FLAGYL) IVPB 500 mg  Status:  Discontinued        500 mg 100 mL/hr over 60 Minutes Intravenous Every 12 hours 12/08/20 1231 12/14/20 0837   12/08/20 1400  ciprofloxacin (CIPRO) IVPB 400 mg  Status:  Discontinued        400 mg 200 mL/hr over 60 Minutes Intravenous Every 24 hours 12/08/20 1231 12/09/20 2017   12/08/20 1315  vancomycin (VANCOREADY) IVPB 2000 mg/400 mL        2,000  mg 200 mL/hr over 120 Minutes Intravenous  Once 12/08/20 1218 12/08/20 1506   12/05/20 1130  fluconazole (DIFLUCAN) tablet 200 mg  Status:  Discontinued        200 mg Oral Daily 12/05/20 1046 12/09/20 0725   11/30/20 1400  fluconazole (DIFLUCAN) IVPB 200 mg  Status:  Discontinued        200 mg 100 mL/hr over 60 Minutes Intravenous Every 24 hours 11/29/20 1633 12/05/20 1044   11/29/20 1800  fluconazole (DIFLUCAN) IVPB 400 mg        400 mg 100 mL/hr over 120 Minutes Intravenous  Once 11/29/20 1633 11/30/20 0724   11/29/20 1700  fluconazole (DIFLUCAN) IVPB 200 mg  Status:  Discontinued        200 mg 100 mL/hr over 60 Minutes Intravenous Every 24 hours 11/29/20 1611 11/29/20 1633   11/23/20 0000  ceFAZolin (ANCEF) IVPB 1 g/50 mL premix       Note to Pharmacy: To be given in specials   1 g 100 mL/hr over 30 Minutes Intravenous  Once 11/22/20 1106 11/22/20 1750   11/14/20 2200  Ampicillin-Sulbactam (UNASYN) 3 g in sodium chloride 0.9 % 100 mL IVPB  Status:  Discontinued        3 g 200 mL/hr over 30 Minutes Intravenous Every 12 hours 11/14/20 1610 12/08/20 1143   11/14/20 0900  piperacillin-tazobactam (ZOSYN) IVPB 2.25 g  Status:  Discontinued        2.25 g 100 mL/hr over 30 Minutes Intravenous Every 8 hours 11/14/20 0805 11/14/20 1609   11/13/20 2200  piperacillin-tazobactam (ZOSYN) IVPB 3.375 g  Status:  Discontinued        3.375 g 12.5 mL/hr over 240 Minutes Intravenous Every 12 hours 11/13/20 0758 11/14/20 0805   11/13/20 0600  piperacillin-tazobactam (ZOSYN) IVPB 2.25 g        2.25 g 100 mL/hr over 30 Minutes Intravenous Every 8 hours 11/12/20 2155 11/13/20 1725   11/11/20 1600  vancomycin (VANCOCIN) IVPB 1000 mg/200 mL premix  Status:  Discontinued        1,000 mg 200 mL/hr over 60 Minutes Intravenous Every 48 hours 11/09/20 1618 11/10/20 1045   11/11/20 1558  vancomycin (VANCOCIN) powder  Status:  Discontinued          As needed 11/11/20 1558 11/11/20 1558   11/10/20 2000   vancomycin (VANCOCIN) IVPB 1000 mg/200 mL premix  Status:  Discontinued        1,000 mg 200 mL/hr over 60 Minutes Intravenous Every 48 hours 11/10/20 1918 11/12/20 0940   11/09/20 1800  ceFEPIme (MAXIPIME) 2 g in sodium chloride 0.9 % 100 mL IVPB  Status:  Discontinued        2 g 200 mL/hr over 30 Minutes Intravenous Every 24 hours 11/09/20 1619 11/12/20 2146   11/09/20 1619  vancomycin variable dose per unstable renal function (pharmacist dosing)  Status:  Discontinued         Does not apply See admin instructions 11/09/20 1619 11/12/20 1521   11/09/20 1615  vancomycin (VANCOREADY) IVPB 1750 mg/350 mL        1,750 mg 175 mL/hr over 120 Minutes Intravenous  Once 11/09/20 1606 11/09/20 1947   11/09/20 1600  metroNIDAZOLE (FLAGYL) IVPB 500 mg  Status:  Discontinued        500 mg 100 mL/hr over 60 Minutes Intravenous Every 8 hours 11/09/20 1555 11/12/20 2146         Micro    Objective   Vitals:   12/16/20 0407 12/16/20 0815 12/16/20 1122 12/16/20 1520  BP: 126/79 129/78 136/78 128/83  Pulse: 82 (!) 102 74 73  Resp: 20 18 20 18   Temp: 98.3 F (36.8 C) 97.8 F (36.6 C) 97.8 F (36.6 C) 97.6 F (36.4 C)  TempSrc: Oral     SpO2: 97% 97% 97% 96%  Weight: 97.9 kg     Height:        Intake/Output Summary (Last 24 hours) at 12/16/2020 1919 Last data filed at 12/15/2020 2023 Gross per 24 hour  Intake --  Output 300 ml  Net -300 ml   Filed Weights   12/14/20 0300 12/15/20 0500 12/16/20 0407  Weight: 96.5 kg 100.4 kg 97.9 kg    Physical Exam:  General exam: awake, alert, no acute distress, obese Respiratory system: lungs clear b/l, normal respiratory effort, on room air. Cardiovascular system: RRR, 1+ LE edema.   Central nervous system: A&O x3, dysarthric speech stable. no other gross focal neurologic deficits GI; soft non-tender abdomen Psychiatry: normal mood, congruent affect, judgement and insight appear normal  Labs   Data Reviewed: I have personally reviewed  following labs and imaging studies  CBC: Recent Labs  Lab 12/12/20 0434 12/12/20 1732 12/13/20 0536 12/14/20 0513 12/15/20 0536 12/16/20 0402  WBC 7.6  --  21.1* 23.5* 17.0* 15.8*  NEUTROABS 0.9*  --  13.9* 17.1* 14.5* 11.2*  HGB 8.1* 8.1* 8.2* 8.2* 8.8* 8.4*  HCT 25.9* 26.1* 26.4* 26.7* 28.2* 27.3*  MCV 92.2  --  93.0 94.3 94.0 93.8  PLT 127*  --  147* 154 144* 622*   Basic Metabolic Panel: Recent Labs  Lab 12/12/20 0434 12/13/20 0536 12/14/20 0513 12/15/20 0536 12/16/20 0402  NA 134* 137 136 136 138  K 4.3 4.0 4.2 3.9 4.0  CL 104 107 106 107 110  CO2 22 21* 18* 21* 19*  GLUCOSE 107* 88 100* 115* 107*  BUN 54* 55* 55* 53* 53*  CREATININE 3.74* 3.83* 3.70* 3.85* 3.70*  CALCIUM 8.6* 8.7* 8.5* 8.7* 8.6*  MG  --  2.0  --   --   --   PHOS  --   --   --   --  3.4   GFR: Estimated Creatinine Clearance: 17.2 mL/min (A) (by C-G formula based on SCr of 3.7 mg/dL (H)). Liver Function Tests: Recent Labs  Lab 12/13/20 0536 12/16/20 0402  AST 23  --   ALT 11  --   ALKPHOS 138*  --   BILITOT 0.6  --   PROT 5.4*  --   ALBUMIN 2.4* 2.6*   No results for input(s): LIPASE, AMYLASE in the last 168 hours.  No results for input(s): AMMONIA in the last 168 hours. Coagulation Profile: No results for input(s): INR, PROTIME in the last 168 hours. Cardiac Enzymes: No results for input(s): CKTOTAL, CKMB, CKMBINDEX, TROPONINI in the last 168 hours. BNP (last 3 results) No results for input(s): PROBNP in the last 8760 hours. HbA1C: No results for input(s): HGBA1C in the last 72 hours. CBG: No results for input(s): GLUCAP in the last 168 hours. Lipid Profile: No results for input(s): CHOL, HDL, LDLCALC, TRIG, CHOLHDL, LDLDIRECT in the last 72 hours. Thyroid Function Tests: No results for input(s): TSH, T4TOTAL, FREET4, T3FREE, THYROIDAB in the last 72 hours. Anemia Panel: No results for input(s): VITAMINB12, FOLATE, FERRITIN, TIBC, IRON, RETICCTPCT in the last 72 hours.  Sepsis  Labs: No results for input(s): PROCALCITON, LATICACIDVEN in the last 168 hours.  No results found for this or any previous visit (from the past 240 hour(s)).     Imaging Studies   No results found.   Medications   Scheduled Meds:  (feeding supplement) PROSource Plus  30 mL Oral TID BM   allopurinol  50 mg Oral QODAY   apixaban  5 mg Oral BID   vitamin C  500 mg Oral BID   atorvastatin  40 mg Oral Daily   budesonide (PULMICORT) nebulizer solution  0.5 mg Nebulization BID   diltiazem  180 mg Oral Daily   doxycycline  100 mg Oral Q12H   epoetin (EPOGEN/PROCRIT) injection  20,000 Units Subcutaneous Weekly   feeding supplement  1 Container Oral TID BM   ferrous gluconate  324 mg Oral Q breakfast   hydrALAZINE  100 mg Oral Q8H   levothyroxine  100 mcg Oral Q0600   mouth rinse  15 mL Mouth Rinse BID   metoprolol tartrate  50 mg Oral BID   multivitamin with minerals  1 tablet Oral Daily   pantoprazole  40 mg Oral BID   sodium chloride flush  3 mL Intravenous Q12H   venlafaxine  50 mg Oral BID   Continuous Infusions:  sodium chloride 10 mL/hr at 12/05/20 0604   sodium chloride Stopped (12/06/20 1004)   metronidazole 500 mg (12/16/20 1806)       LOS: 37 days    Time spent: 25 minutes with > 50% spent at bedside and in coordination of care     Ezekiel Slocumb, DO [facility]  12/16/2020, 7:19 PM      If 7PM-7AM, please contact night-coverage. How to contact the [facility] Attending or Consulting provider [place] or covering provider during after hours [place], for this patient?    Check the care team in [facility] and look for a) attending/consulting TRH provider listed and b) the [facility] team listed Log into [email or web address] and use [place]'s universal password to access. If you do not have the password, please contact the hospital operator. Locate the [facility] provider you are looking for under [facility] and page to a number that you can be directly reached. If you  still have difficulty reaching the provider, please page the [facility] (Director on Call) for the Hospitalists listed on amion for assistance.

## 2020-12-17 LAB — CBC WITH DIFFERENTIAL/PLATELET
Abs Immature Granulocytes: 1.46 10*3/uL — ABNORMAL HIGH (ref 0.00–0.07)
Basophils Absolute: 0.1 10*3/uL (ref 0.0–0.1)
Basophils Relative: 1 %
Eosinophils Absolute: 0.1 10*3/uL (ref 0.0–0.5)
Eosinophils Relative: 1 %
HCT: 27.4 % — ABNORMAL LOW (ref 36.0–46.0)
Hemoglobin: 8.4 g/dL — ABNORMAL LOW (ref 12.0–15.0)
Immature Granulocytes: 10 %
Lymphocytes Relative: 10 %
Lymphs Abs: 1.5 10*3/uL (ref 0.7–4.0)
MCH: 29.1 pg (ref 26.0–34.0)
MCHC: 30.7 g/dL (ref 30.0–36.0)
MCV: 94.8 fL (ref 80.0–100.0)
Monocytes Absolute: 0.9 10*3/uL (ref 0.1–1.0)
Monocytes Relative: 6 %
Neutro Abs: 11.1 10*3/uL — ABNORMAL HIGH (ref 1.7–7.7)
Neutrophils Relative %: 72 %
Platelets: 128 10*3/uL — ABNORMAL LOW (ref 150–400)
RBC: 2.89 MIL/uL — ABNORMAL LOW (ref 3.87–5.11)
RDW: 17.7 % — ABNORMAL HIGH (ref 11.5–15.5)
Smear Review: NORMAL
WBC: 15.2 10*3/uL — ABNORMAL HIGH (ref 4.0–10.5)
nRBC: 0.4 % — ABNORMAL HIGH (ref 0.0–0.2)

## 2020-12-17 LAB — BASIC METABOLIC PANEL
Anion gap: 5 (ref 5–15)
BUN: 52 mg/dL — ABNORMAL HIGH (ref 8–23)
CO2: 20 mmol/L — ABNORMAL LOW (ref 22–32)
Calcium: 8.6 mg/dL — ABNORMAL LOW (ref 8.9–10.3)
Chloride: 110 mmol/L (ref 98–111)
Creatinine, Ser: 3.84 mg/dL — ABNORMAL HIGH (ref 0.44–1.00)
GFR, Estimated: 12 mL/min — ABNORMAL LOW (ref >60)
Glucose, Bld: 109 mg/dL — ABNORMAL HIGH (ref 70–99)
Potassium: 4.1 mmol/L (ref 3.5–5.1)
Sodium: 135 mmol/L (ref 135–145)

## 2020-12-17 NOTE — TOC Progression Note (Signed)
Transition of Care [facility]) - Progression Note    Patient Details  Name: Autumn [name] MRN: [id number] Date of Birth: 06/10/1950  Transition of Care [facility]) CM/SW [place], [place] Phone Number: 12/17/2020, 2:46 PM  Clinical Narrative:     [place]umn [place] who had previously re started insurance auth with [facility] now reports today that admissions are held for today due to covid, will re assess tomorrow.   Autumn [name] is continuing to work on finding BiPap for patient.    Expected Discharge Plan: [place] Barriers to Discharge: Continued Medical Work up  Expected Discharge Plan and Services Expected Discharge Plan: [place] arrangements for the past 2 months: Single Family Home                                       Social Determinants of Health (SDOH) Interventions    Readmission Risk Interventions No flowsheet data found.

## 2020-12-17 NOTE — Progress Notes (Signed)
Nutrition Follow-up  DOCUMENTATION CODES:   Obesity unspecified  INTERVENTION:   -Continue Boost Breeze po TID, each supplement provides 250 kcal and 9 grams of protein  -Continue 30 ml Prosource Plus TID, each supplement provides 100 kcals and 15 grams protein -Continue MVI with minerals daily -Continue liberalize diet to regular -Continue feeding assistance with meals  NUTRITION DIAGNOSIS:   Increased nutrient needs related to wound healing as evidenced by estimated needs.  Ongoing  GOAL:   Patient will meet greater than or equal to 90% of their needs  Progressing   MONITOR:   PO intake, Supplement acceptance, Diet advancement, Labs, Weight trends, Skin, I & O's  REASON FOR ASSESSMENT:   Consult Wound healing  ASSESSMENT:   Patient is a 70 year old female who presents to the ER for evaluation of frequent falls and is found to be septic from left foot osteomyelitis and also in rapid A. fib.  10/31- s/p  Procedure(s): AMPUTATION RAY - 2nd, bone biopsy 3rd metatarsal head 11/2- intubated 11/3- MRI of brain revealed acute lt precentral gyrus stroke, extubated 11/4- s/p BSE- advanced to dysphagia 1 diet with nectar thick liquids 11/8- s/p BSE- advanced to dysphagia 2 diet with thin liquids, NGT d/c 11/11- s/p aortogram 11/16- s/p EGD- revealed white nummular lesions in esophageal mucosa, gastritis, and erythematous duodenopathy  Reviewed I/O's: -60 ml x 24 hours and -1.3 L since 12/03/20  UOP: 300 ml x 24 hours  Pt receiving nursing care at time of visit. Per chart review, pt has been refusing therapy.   Pt's intake has improved since last visit. Noted meal completions 50-90%. Pt is taking Boost Breeze and Prosource supplements.   Pt awaiting SNF placement.    Medications reviewed and include vitamin C and cardizem.    Labs reviewed.   Diet Order:   Diet Order             Diet regular Room service appropriate? Yes; Fluid consistency: Thin  Diet effective  now                   EDUCATION NEEDS:   Education needs have been addressed  Skin:  Skin Assessment: Skin Integrity Issues: Skin Integrity Issues:: Wound VAC Wound Vac: lt foot Diabetic Ulcer: lt foot  Last BM:  12/13/20  Height:   Ht Readings from Last 1 Encounters:  11/09/20 5\' 6"  (1.676 m)    Weight:   Wt Readings from Last 1 Encounters:  12/16/20 97.9 kg    Ideal Body Weight:  59.1 kg  BMI:  Body mass index is 34.84 kg/m.  Estimated Nutritional Needs:   Kcal:  1800-2100kcal/day  Protein:  90-105g/day  Fluid:  1.5-1.8L/day    Loistine Chance, RD, LDN, [place] Registered Dietitian II Certified Diabetes Care and Education Specialist Please refer to AMION for RD and/or RD on-call/weekend/after hours pager

## 2020-12-17 NOTE — Progress Notes (Signed)
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 Other comorbidities include sepsis without shock secondary to osteomyelitis and cellulitis left foot (IV zosyn), AKI on CKD, rapid afib (Cardizem + Heparin gtt), NSTEMI suspect demand ischemia, anemia acute on chronic. Transferred to ICU and intubated 11/2 after evolving neuro changes with unresponsiveness after coughing episode. 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    PT Comments    Pt tolerated treatment fair today, and was overall limited with participation secondary to fatigue, generalized weakness, and impaired processing. Required max assist today for bed mobility, and was unable to stand despite total assist from PT. Demonstrates impaired proximal stability noted by R posterolateral lean, requiring increased multimodal cueing for anterior weight shift/correction. Questionable effort by pt as it did not seem that she was actively participating with UE/LE during transfer despite max encouragement. Increased assist and cueing required at end of session due to significant fatigue from prolonged sitting EOB. Increased time/effort for limited mobility during session, as pt required rest breaks and was noted to have labored breathing (SpO2 >90% throughout session). Pt will continue to benefit from skilled acute PT services to address deficits for return to baseline function. Will continue to recommend SNF at DC.    Recommendations for follow up  therapy are one component of a multi-disciplinary discharge planning process, led by the attending physician.  Recommendations may be updated based on patient status, additional functional criteria and insurance authorization.  Follow Up Recommendations  Skilled nursing-short term rehab (<3 hours/day)     Assistance Recommended at Discharge Frequent or constant Supervision/Assistance  Equipment Recommendations  Other (comment) (defer to post acute)       Precautions / Restrictions Precautions Precautions: Fall Restrictions Weight Bearing Restrictions: Yes LLE Weight Bearing: Weight bearing as tolerated (in post op shoe)     Mobility  Bed Mobility Overal bed mobility: Needs Assistance Bed Mobility: Supine to Sit;Sit to Supine     Supine to sit: Max assist;HOB elevated Sit to supine: Max assist   General bed mobility comments: for trunk/BLE facilitation; hand over hand cues and encouragement for sequencing/participation    Transfers Overall transfer level: Needs assistance Equipment used: Rolling walker (2 wheels) Transfers: Bed to chair/wheelchair/BSC;Sit to/from Stand Sit to Stand: Total assist;From elevated surface   Squat pivot transfers: Total assist       General transfer comment: attempted STS from elevated bed height via RW and squat pivot, but unable. questionable effort from pt as it did not seem like she was actively assisting with BUE/BLE    Ambulation/Gait               General Gait Details: deferred, unable to stand     Balance Overall balance assessment: Needs assistance Sitting-balance support: Feet supported;Bilateral upper extremity supported Sitting balance-Leahy Scale: Poor Sitting balance - Comments: increased R posterolateral lean in sitting, requring multimodal cues from PT for anterior weight shift/correction       Standing balance comment: unable to assess  Cognition Arousal/Alertness:  Awake/alert Behavior During Therapy: WFL for tasks assessed/performed Overall Cognitive Status: Within Functional Limits for tasks assessed                                 General Comments: Pt is alert however does have severe cognition deficits. Needs encouragement for participation. Impaired processing, requiring increased time for sequencing and performance of task. Questionable effort during session.        Exercises Other Exercises Other Exercises: Participated in bed mobility, with attempt for STS from EOB. Other Exercises: Pt educated re: PT role/POC, DC recommendations, benefits of therapy/participation, WB precautions in surgical shoe.    General Comments General comments (skin integrity, edema, etc.): kerlix intact to L foot without strikethrough      Pertinent Vitals/Pain Pain Assessment: No/denies pain     PT Goals (current goals can now be found in the care plan section) Acute Rehab PT Goals Patient Stated Goal: to go to rehab PT Goal Formulation: With patient Time For Goal Achievement: 12/26/20 Potential to Achieve Goals: Fair Progress towards PT goals: Progressing toward goals    Frequency    Min 2X/week      PT Plan Current plan remains appropriate       AM-PAC PT "6 Clicks" Mobility   Outcome Measure  Help needed turning from your back to your side while in a flat bed without using bedrails?: A Lot Help needed moving from lying on your back to sitting on the side of a flat bed without using bedrails?: A Lot Help needed moving to and from a bed to a chair (including a wheelchair)?: Total Help needed standing up from a chair using your arms (e.g., wheelchair or bedside chair)?: Total Help needed to walk in hospital room?: Total Help needed climbing 3-5 steps with a railing? : Total 6 Click Score: 8    End of Session Equipment Utilized During Treatment: Gait belt Activity Tolerance: Patient limited by fatigue Patient left: in  bed;with call bell/phone within reach;with bed alarm set (LLE elevated; purewick donned; bed in chair position) Nurse Communication: Mobility status PT Visit Diagnosis: Other abnormalities of gait and mobility (R26.89);Hemiplegia and hemiparesis;Muscle weakness (generalized) (M62.81);History of falling (Z91.81) Hemiplegia - Right/Left: Right Hemiplegia - dominant/non-dominant: Dominant Hemiplegia - caused by: Cerebral infarction     Time: 3614-4315 PT Time Calculation (min) (ACUTE ONLY): 24 min  Charges:  $Therapeutic Activity: 23-37 mins                       Herminio Commons, PT, DPT 2:28 PM,12/17/20

## 2020-12-17 NOTE — Progress Notes (Signed)
PROGRESS NOTE    Autumn [name]   BWG:[id number]  DOB: Oct 11, 1950  PCP: Harlow Ohms, MD    DOA: 11/09/2020 LOS: 83    Brief Narrative / Hospital Course to Date:   Autumn [name] is a 70 y.o. 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  She'd been following with a [facility] podiatrist outpatient and had recently refused surgery.  She'd recently been treated with antibiotics at [facility] for the diabetic foot infection with enterobacter aerogans culture positive.  She was admitted on 10/29 due to falls at home with worsening L foot chronic diabetic ulcer with osteomyelitis of the 2nd and 3rd metatarsals and cellulitis noted on MRI.  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  Her post operative course was complicated by unresponsiveness and neurologic changes. She was found to have a stroke and was intubated for airway protection.  She was extubated 11/3 and transferred to [facility] service on 11/4.  Hospital course further complicated by acute on chronic anemia with melena after starting on anticoagulation, requiring blood transfusions and GI evaluation.    She developed severe neutropenia and this was attributed to IV Unasyn.  Antibiotics were adjusted and she was started on Neupogen.  She developed atrial fibrillation with RVR which is improved with rate control drugs (metoprolol and Cardizem).  Assessment & Plan   Principal Problem:   Sepsis ([place]) Active Problems:   Atrial fibrillation with RVR (HCC)   Osteomyelitis of foot, left, acute ([place])   CKD stage 4 due to type 2 diabetes mellitus ([place])   Liver cirrhosis secondary to NASH (nonalcoholic steatohepatitis) ([place])   Hypertension   History of anemia due to CKD   Frequent falls   AKI (acute kidney injury) ([place])   Cellulitis and abscess of foot, except toes   Infectious  tenosynovitis   Wheeze   Atrial fibrillation with rapid ventricular response (HCC)   Bilateral carotid artery stenosis   Neutropenia (HCC)   Drug-induced neutropenia (HCC)   Normocytic anemia   Thrombocytopenia (HCC)   Acute hypoxemic respiratory failure (HCC)   Left wrist pain   Stroke (HCC)   Septic shock from left diabetic foot infection, left foot osteomyelitis and cellulitis, septic arthritis of second MTP joint: S/p amputation ray second left metatarsal, I&D deep abscess multiple fascial planes of the left foot on 11/12/2020.  Of note, surgical wound culture showed strep mitis/oralis, staff aureus, abundant Bacteroides species and rare Pasteurella multocida. --Abx: doxycycline and Flagyl through 12/12 --Transitioned to PO antibiotics on 12/3 afternoon --Follow-up ID recommendations --Podiatry reassessed - see recs for wound care and follow up --Post-op shoe ordered - need to obtain and work with PT in shoe prior to d/c to ensure fit    Severe leukopenia/neutropenia: neurtropenia is improving. Hematology consulted.   Suspect due to Abx and/or diflucan Completed 5 days Granix on 12/1.    Recurrent Melena - Resolved.   Occurred on 12/1 afternoon, pt noted to have black stools.  GI bleeding earlier this admission and GI evaluation as outlined.  Possibly due to oral iron supplement although stools had reportedly been normal in color. 12/2-3: Hemoglobin remaining stable --Resumed Eliquis and Hbg stable --Monitor closely.   --Bleeding scan if active bleeding.   Atrial fibrillation with RVR, - HR's controlled. Had chest pressure during episode, resolved.  Troponin was  unremarkable. --Rate control with cardizem and metoprolol --Continue Eliquis  Hypertension --Continue Cardizem, metoprolol.     Acute stroke with expressive aphasia and dysphagia: --Continue Lipitor and Eliquis.   --SLP following --Now tolerating regular diet Aphasia much improved, speech mildly dysarthric which  waxes and wanes.   Acute GI bleeding/melena on 11/26/2020: EGD on 11/28/2018 showed just an esophageal mucosa, gastritis, erythematous duodenopathy.  Colonoscopy 11/29/2020 showed multiple polyps that were removed but the colon prep was poor.  Continue Protonix.   Esophageal candidiasis: Fluconazole was discontinued on 12/09/2020 because of neutropenia.  No further treatment indicated.  Monitor.   Right hand/thumb pain and stiffness: Probably from arthritis/gout.   Uric acid was 6.8. Left wrist x-ray on 11/30 showed prominent soft tissue swelling without acute osseous abnormalities, arthritis at the first [facility] joint and STT interval. --Continue allopurinol.   --Analgesics as needed.     Acute hypoxic respiratory failure, probable OSA: Continue BiPAP at night.  TOC awaiting SNF to confirm they will have BiPAP for her.   Acute on chronic diastolic CHF: 2D echo showed normal EF, mild LVH, severely dilated left atrium, indeterminate LV diastolic parameters.    Aspiration pneumonia: Completed treatment   Acute metabolic encephalopathy - resolved --Delirium precautions  Thrombocytopenia: Improving.  Monitor CBC.  CKD stage IV - Nephrology following. Now with Cr slightly worsening in setting of poor PO intake. On gentle IV hydration. Monitor BMP.  Generalized weakness / physical debility - due to prolonged hospital stay and stroke.  Has become increasingly weak. --PT/OT following --SNF placement pending auth and BiPAP available at facility --OOB and mobilize as often as possible   Other comorbidities include NASH liver cirrhosis, hypothyroidism, type II DM    Obesity: Body mass index is 34.84 kg/m.  Complicates overall care and prognosis.  Recommend lifestyle modifications including physical activity and diet for weight loss and overall long-term health.   DVT prophylaxis: Place and maintain sequential compression device Start: 11/15/20 1527 apixaban (ELIQUIS) tablet 5 mg   Diet:   Diet Orders (From admission, onward)     Start     Ordered   12/11/20 1322  Diet regular Room service appropriate? Yes; Fluid consistency: Thin  Diet effective now       Question Answer Comment  Room service appropriate? Yes   Fluid consistency: Thin      12/11/20 1321              Code Status: Full Code   Subjective 12/17/20    Pt awake resting in bed.  Has not received the post-op shoe to attempt weight-bearing yet, as of AM encounter today.  She denies nausea this AM, but did have some yesterday.  No other acute complaints.  Says she's feeling well overall but very weak.     Disposition Plan & Communication   Status is: Inpatient  Remains inpatient appropriate because: awaiting confirmation of BiPAP at SNF, insurance auth pending for SNF, work with PT in postop shoe now that she's cleared for weight-bearing.   Consults, Procedures, Significant Events   Consultants:  Gastroenterology PCCM Neurology Infectious disease Hematology   Procedures:  Intubation on 11/13/2020 EGD and colonoscopy  Arch aortogram, selective cannulation of the left common carotid artery Amputation ray second metatarsal and, I&D deep abscess multiple fascial planes, bone biopsy third metatarsal  Antimicrobials:  Anti-infectives (From admission, onward)    Start     Dose/Rate Route Frequency Ordered Stop   12/14/20 1800  metroNIDAZOLE (FLAGYL) IVPB 500 mg  500 mg 100 mL/hr over 60 Minutes Intravenous 2 times daily 12/14/20 0837     12/14/20 1000  doxycycline (VIBRA-TABS) tablet 100 mg        100 mg Oral Every 12 hours 12/14/20 0836     12/10/20 1200  vancomycin (VANCOREADY) IVPB 750 mg/150 mL  Status:  Discontinued        750 mg 150 mL/hr over 60 Minutes Intravenous Every 48 hours 12/08/20 1236 12/09/20 2017   12/10/20 1000  doxycycline (VIBRAMYCIN) 100 mg in sodium chloride 0.9 % 250 mL IVPB  Status:  Discontinued        100 mg 125 mL/hr over 120 Minutes Intravenous Every 12  hours 12/09/20 2017 12/14/20 0836   12/08/20 1400  metroNIDAZOLE (FLAGYL) IVPB 500 mg  Status:  Discontinued        500 mg 100 mL/hr over 60 Minutes Intravenous Every 12 hours 12/08/20 1231 12/14/20 0837   12/08/20 1400  ciprofloxacin (CIPRO) IVPB 400 mg  Status:  Discontinued        400 mg 200 mL/hr over 60 Minutes Intravenous Every 24 hours 12/08/20 1231 12/09/20 2017   12/08/20 1315  vancomycin (VANCOREADY) IVPB 2000 mg/400 mL        2,000 mg 200 mL/hr over 120 Minutes Intravenous  Once 12/08/20 1218 12/08/20 1506   12/05/20 1130  fluconazole (DIFLUCAN) tablet 200 mg  Status:  Discontinued        200 mg Oral Daily 12/05/20 1046 12/09/20 0725   11/30/20 1400  fluconazole (DIFLUCAN) IVPB 200 mg  Status:  Discontinued        200 mg 100 mL/hr over 60 Minutes Intravenous Every 24 hours 11/29/20 1633 12/05/20 1044   11/29/20 1800  fluconazole (DIFLUCAN) IVPB 400 mg        400 mg 100 mL/hr over 120 Minutes Intravenous  Once 11/29/20 1633 11/30/20 0724   11/29/20 1700  fluconazole (DIFLUCAN) IVPB 200 mg  Status:  Discontinued        200 mg 100 mL/hr over 60 Minutes Intravenous Every 24 hours 11/29/20 1611 11/29/20 1633   11/23/20 0000  ceFAZolin (ANCEF) IVPB 1 g/50 mL premix       Note to Pharmacy: To be given in specials   1 g 100 mL/hr over 30 Minutes Intravenous  Once 11/22/20 1106 11/22/20 1750   11/14/20 2200  Ampicillin-Sulbactam (UNASYN) 3 g in sodium chloride 0.9 % 100 mL IVPB  Status:  Discontinued        3 g 200 mL/hr over 30 Minutes Intravenous Every 12 hours 11/14/20 1610 12/08/20 1143   11/14/20 0900  piperacillin-tazobactam (ZOSYN) IVPB 2.25 g  Status:  Discontinued        2.25 g 100 mL/hr over 30 Minutes Intravenous Every 8 hours 11/14/20 0805 11/14/20 1609   11/13/20 2200  piperacillin-tazobactam (ZOSYN) IVPB 3.375 g  Status:  Discontinued        3.375 g 12.5 mL/hr over 240 Minutes Intravenous Every 12 hours 11/13/20 0758 11/14/20 0805   11/13/20 0600   piperacillin-tazobactam (ZOSYN) IVPB 2.25 g        2.25 g 100 mL/hr over 30 Minutes Intravenous Every 8 hours 11/12/20 2155 11/13/20 1725   11/11/20 1600  vancomycin (VANCOCIN) IVPB 1000 mg/200 mL premix  Status:  Discontinued        1,000 mg 200 mL/hr over 60 Minutes Intravenous Every 48 hours 11/09/20 1618 11/10/20 1045   11/11/20 1558  vancomycin (VANCOCIN) powder  Status:  Discontinued  As needed 11/11/20 1558 11/11/20 1558   11/10/20 2000  vancomycin (VANCOCIN) IVPB 1000 mg/200 mL premix  Status:  Discontinued        1,000 mg 200 mL/hr over 60 Minutes Intravenous Every 48 hours 11/10/20 1918 11/12/20 0940   11/09/20 1800  ceFEPIme (MAXIPIME) 2 g in sodium chloride 0.9 % 100 mL IVPB  Status:  Discontinued        2 g 200 mL/hr over 30 Minutes Intravenous Every 24 hours 11/09/20 1619 11/12/20 2146   11/09/20 1619  vancomycin variable dose per unstable renal function (pharmacist dosing)  Status:  Discontinued         Does not apply See admin instructions 11/09/20 1619 11/12/20 1521   11/09/20 1615  vancomycin (VANCOREADY) IVPB 1750 mg/350 mL        1,750 mg 175 mL/hr over 120 Minutes Intravenous  Once 11/09/20 1606 11/09/20 1947   11/09/20 1600  metroNIDAZOLE (FLAGYL) IVPB 500 mg  Status:  Discontinued        500 mg 100 mL/hr over 60 Minutes Intravenous Every 8 hours 11/09/20 1555 11/12/20 2146         Micro    Objective   Vitals:   12/17/20 0726 12/17/20 1143 12/17/20 1502 12/17/20 1941  BP: 134/73 130/79 126/73 (!) 153/81  Pulse: 82 79 79 68  Resp: 20 18 18 18   Temp: 97.9 F (36.6 C) 97.7 F (36.5 C) 97.8 F (36.6 C) 97.7 F (36.5 C)  TempSrc:  Oral Oral Oral  SpO2: 97% 97% 99% 97%  Weight:      Height:        Intake/Output Summary (Last 24 hours) at 12/17/2020 2006 Last data filed at 12/17/2020 1500 Gross per 24 hour  Intake 240 ml  Output 500 ml  Net -260 ml   Filed Weights   12/14/20 0300 12/15/20 0500 12/16/20 0407  Weight: 96.5 kg 100.4 kg 97.9  kg    Physical Exam:  General exam: awake, alert, no acute distress, obese Respiratory system: lungs clear b/l, normal respiratory effort, on room air. Cardiovascular system: RRR, 1+ LE edema stable R>L.   Central nervous system: A&O x3, dysarthric speech stable. no other gross focal neurologic deficits Psychiatry: normal mood, congruent affect, judgement and insight appear normal  Labs   Data Reviewed: I have personally reviewed following labs and imaging studies  CBC: Recent Labs  Lab 12/13/20 0536 12/14/20 0513 12/15/20 0536 12/16/20 0402 12/17/20 0645  WBC 21.1* 23.5* 17.0* 15.8* 15.2*  NEUTROABS 13.9* 17.1* 14.5* 11.2* 11.1*  HGB 8.2* 8.2* 8.8* 8.4* 8.4*  HCT 26.4* 26.7* 28.2* 27.3* 27.4*  MCV 93.0 94.3 94.0 93.8 94.8  PLT 147* 154 144* 134* 563*   Basic Metabolic Panel: Recent Labs  Lab 12/13/20 0536 12/14/20 0513 12/15/20 0536 12/16/20 0402 12/17/20 0645  NA 137 136 136 138 135  K 4.0 4.2 3.9 4.0 4.1  CL 107 106 107 110 110  CO2 21* 18* 21* 19* 20*  GLUCOSE 88 100* 115* 107* 109*  BUN 55* 55* 53* 53* 52*  CREATININE 3.83* 3.70* 3.85* 3.70* 3.84*  CALCIUM 8.7* 8.5* 8.7* 8.6* 8.6*  MG 2.0  --   --   --   --   PHOS  --   --   --  3.4  --    GFR: Estimated Creatinine Clearance: 16.5 mL/min (A) (by C-G formula based on SCr of 3.84 mg/dL (H)). Liver Function Tests: Recent Labs  Lab 12/13/20 0536 12/16/20 0402  AST 23  --   ALT 11  --   ALKPHOS 138*  --   BILITOT 0.6  --   PROT 5.4*  --   ALBUMIN 2.4* 2.6*   No results for input(s): LIPASE, AMYLASE in the last 168 hours. No results for input(s): AMMONIA in the last 168 hours. Coagulation Profile: No results for input(s): INR, PROTIME in the last 168 hours. Cardiac Enzymes: No results for input(s): CKTOTAL, CKMB, CKMBINDEX, TROPONINI in the last 168 hours. BNP (last 3 results) No results for input(s): PROBNP in the last 8760 hours. HbA1C: No results for input(s): HGBA1C in the last 72  hours. CBG: No results for input(s): GLUCAP in the last 168 hours. Lipid Profile: No results for input(s): CHOL, HDL, LDLCALC, TRIG, CHOLHDL, LDLDIRECT in the last 72 hours. Thyroid Function Tests: No results for input(s): TSH, T4TOTAL, FREET4, T3FREE, THYROIDAB in the last 72 hours. Anemia Panel: No results for input(s): VITAMINB12, FOLATE, FERRITIN, TIBC, IRON, RETICCTPCT in the last 72 hours.  Sepsis Labs: No results for input(s): PROCALCITON, LATICACIDVEN in the last 168 hours.  No results found for this or any previous visit (from the past 240 hour(s)).     Imaging Studies   No results found.   Medications   Scheduled Meds:  (feeding supplement) PROSource Plus  30 mL Oral TID BM   allopurinol  50 mg Oral QODAY   apixaban  5 mg Oral BID   vitamin C  500 mg Oral BID   atorvastatin  40 mg Oral Daily   budesonide (PULMICORT) nebulizer solution  0.5 mg Nebulization BID   diltiazem  180 mg Oral Daily   doxycycline  100 mg Oral Q12H   epoetin (EPOGEN/PROCRIT) injection  20,000 Units Subcutaneous Weekly   feeding supplement  1 Container Oral TID BM   ferrous gluconate  324 mg Oral Q breakfast   hydrALAZINE  100 mg Oral Q8H   levothyroxine  100 mcg Oral Q0600   mouth rinse  15 mL Mouth Rinse BID   metoprolol tartrate  50 mg Oral BID   multivitamin with minerals  1 tablet Oral Daily   pantoprazole  40 mg Oral BID   sodium chloride flush  3 mL Intravenous Q12H   venlafaxine  50 mg Oral BID   Continuous Infusions:  sodium chloride 10 mL/hr at 12/05/20 0604   sodium chloride Stopped (12/06/20 1004)   metronidazole 500 mg (12/17/20 1758)       LOS: 38 days    Time spent: 25 minutes with > 50% spent at bedside and in coordination of care     Ezekiel Slocumb, DO [facility]  12/17/2020, 8:06 PM      If 7PM-7AM, please contact night-coverage. How to contact the [facility] Attending or Consulting provider [place] or covering provider during after hours [place],  for this patient?    Check the care team in [facility] and look for a) attending/consulting [facility] provider listed and b) the [facility] team listed Log into [email or web address] and use [place]'s universal password to access. If you do not have the password, please contact the hospital operator. Locate the [facility] provider you are looking for under [facility] and page to a number that you can be directly reached. If you still have difficulty reaching the provider, please page the [facility] (Director on Call) for the Hospitalists listed on amion for assistance.

## 2020-12-17 NOTE — Progress Notes (Signed)
PT Cancellation Note  Patient Details Name: Autumn [name] MRN: [id number] DOB: 05/23/1950   Cancelled Treatment:    Reason Eval/Treat Not Completed: Other (comment). Pt post op shoe not in room. Secure messaged RN and attending MD about obtaining post op shoe for participation with therapy. Will follow up with therapy intervention when surgical shoe available.  Herminio Commons, PT, DPT 10:42 AM,12/17/20

## 2020-12-18 DIAGNOSIS — R6521 Severe sepsis with septic shock: Secondary | ICD-10-CM

## 2020-12-18 DIAGNOSIS — B449 Aspergillosis, unspecified: Secondary | ICD-10-CM

## 2020-12-18 DIAGNOSIS — I480 Paroxysmal atrial fibrillation: Secondary | ICD-10-CM

## 2020-12-18 LAB — CBC WITH DIFFERENTIAL/PLATELET
Abs Immature Granulocytes: 1.44 10*3/uL — ABNORMAL HIGH (ref 0.00–0.07)
Basophils Absolute: 0.1 10*3/uL (ref 0.0–0.1)
Basophils Relative: 1 %
Eosinophils Absolute: 0.1 10*3/uL (ref 0.0–0.5)
Eosinophils Relative: 0 %
HCT: 26.7 % — ABNORMAL LOW (ref 36.0–46.0)
Hemoglobin: 8.3 g/dL — ABNORMAL LOW (ref 12.0–15.0)
Immature Granulocytes: 10 %
Lymphocytes Relative: 10 %
Lymphs Abs: 1.5 10*3/uL (ref 0.7–4.0)
MCH: 29.2 pg (ref 26.0–34.0)
MCHC: 31.1 g/dL (ref 30.0–36.0)
MCV: 94 fL (ref 80.0–100.0)
Monocytes Absolute: 0.8 10*3/uL (ref 0.1–1.0)
Monocytes Relative: 5 %
Neutro Abs: 11.2 10*3/uL — ABNORMAL HIGH (ref 1.7–7.7)
Neutrophils Relative %: 74 %
Platelets: 119 10*3/uL — ABNORMAL LOW (ref 150–400)
RBC: 2.84 MIL/uL — ABNORMAL LOW (ref 3.87–5.11)
RDW: 17.8 % — ABNORMAL HIGH (ref 11.5–15.5)
Smear Review: NORMAL
WBC: 15.1 10*3/uL — ABNORMAL HIGH (ref 4.0–10.5)
nRBC: 0.4 % — ABNORMAL HIGH (ref 0.0–0.2)

## 2020-12-18 LAB — BASIC METABOLIC PANEL
Anion gap: 6 (ref 5–15)
BUN: 55 mg/dL — ABNORMAL HIGH (ref 8–23)
CO2: 19 mmol/L — ABNORMAL LOW (ref 22–32)
Calcium: 8.4 mg/dL — ABNORMAL LOW (ref 8.9–10.3)
Chloride: 110 mmol/L (ref 98–111)
Creatinine, Ser: 3.78 mg/dL — ABNORMAL HIGH (ref 0.44–1.00)
GFR, Estimated: 12 mL/min — ABNORMAL LOW (ref >60)
Glucose, Bld: 106 mg/dL — ABNORMAL HIGH (ref 70–99)
Potassium: 4.1 mmol/L (ref 3.5–5.1)
Sodium: 135 mmol/L (ref 135–145)

## 2020-12-18 NOTE — Progress Notes (Signed)
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 Other comorbidities include sepsis without shock secondary to osteomyelitis and cellulitis left foot (IV zosyn), AKI on CKD, rapid afib (Cardizem + Heparin gtt), NSTEMI suspect demand ischemia, anemia acute on chronic. Transferred to ICU and intubated 11/2 after evolving neuro changes with unresponsiveness after coughing episode. 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   OT comments  Ms. Doublin seen for OT treatment on this date. Upon arrival to room pt awake/alert. Pt in bed and pt agreeable to tx. Pt MAX A for LBD, don/doff socks, seated EOB. Pt MIN A for grooming, seated EOB ~ 10 min. Pt MOD A x2 for STS x 2 with use of post-op shoe. Pt making good progress toward goals. Pt continues to benefit from skilled OT services to maximize return to PLOF and minimize risk of future falls, injury, caregiver burden, and readmission. Will continue to follow POC. Discharge recommendation remains appropriate.     Recommendations for follow up therapy are one component of a multi-disciplinary discharge planning process, led by the attending physician.  Recommendations may be updated based on patient status, additional functional criteria and insurance authorization.    Follow Up Recommendations  Skilled nursing-short term rehab (<3 hours/day)    Assistance Recommended at Discharge Frequent or constant Supervision/Assistance  Equipment Recommendations   Other (comment) (defer to next venue of care)    Recommendations for Other Services      Precautions / Restrictions Precautions Precautions: Fall Restrictions Weight Bearing Restrictions: Yes LLE Weight Bearing: Weight bearing as tolerated Other Position/Activity Restrictions: post op shoe on LLE       Mobility Bed Mobility Overal bed mobility: Needs Assistance Bed Mobility: Supine to Sit;Sit to Supine Rolling: Mod assist;+2 for physical assistance   Supine to sit: Mod assist;+2 for physical assistance Sit to supine: Mod assist;+2 for physical assistance   General bed mobility comments: pt able to initiate movement, ultimately needing help with LE and trunk control    Transfers Overall transfer level: Needs assistance Equipment used: 2 person hand held assist Transfers: Sit to/from Stand Sit to Stand: Mod assist;From elevated surface;+2 physical assistance           General transfer comment: two transfers performed. pt able to initiate movement, good anterior lean noted, facilitation for hip extension once up in standing. maintain ~20seconds at the most     Balance Overall balance assessment: Needs assistance Sitting-balance support: Feet supported Sitting balance-Leahy Scale: Fair Sitting balance - Comments: instances of good balance, but able to maintain seated midline once positioned and with encouragement   Standing balance support: Bilateral upper extremity supported Standing balance-Leahy Scale: Poor                             ADL either performed or assessed with clinical judgement   ADL Overall ADL's : Needs assistance/impaired  General ADL Comments: Pt MAX A for LBD, don/doff socks, seated EOB. Pt MIN A for grooming, seated EOB ~ 10 min.    Extremity/Trunk Assessment               Cognition Arousal/Alertness: Awake/alert Behavior During Therapy: WFL for tasks  assessed/performed Overall Cognitive Status: Impaired/Different from baseline Area of Impairment: Problem solving                       Following Commands: Follows one step commands consistently                  Exercises Exercises: Other exercises Other Exercises Other Exercises: Pt educ re: edema mgmt, OT role, importance of mvmt to maintain functional mobility, falls prevention Other Exercises: Sup<>sit, LBD, sit<>stand x2, grooming   Shoulder Instructions       General Comments      Pertinent Vitals/ Pain       Pain Assessment: No/denies pain  Home Living                                          Prior Functioning/Environment              Frequency  Min 2X/week        Progress Toward Goals  OT Goals(current goals can now be found in the care plan section)  Progress towards OT goals: Progressing toward goals  Acute Rehab OT Goals OT Goal Formulation: With patient Time For Goal Achievement: 12/26/20 Potential to Achieve Goals: Good ADL Goals Pt Will Perform Grooming: sitting;with set-up Pt Will Perform Lower Body Dressing: with mod assist;sitting/lateral leans Pt Will Transfer to Toilet: squat pivot transfer;bedside commode;with max assist Pt/caregiver will Perform Home Exercise Program: Increased ROM;Increased strength;Both right and left upper extremity;With minimal assist  Plan Discharge plan remains appropriate;Frequency needs to be updated    Co-evaluation    PT/OT/SLP Co-Evaluation/Treatment: Yes Reason for Co-Treatment: Complexity of the patient's impairments (multi-system involvement);To address functional/ADL transfers;For patient/therapist safety PT goals addressed during session: Mobility/safety with mobility;Balance OT goals addressed during session: ADL's and self-care      AM-PAC OT "6 Clicks" Daily Activity     Outcome Measure   Help from another person eating meals?: A Little Help from another person  taking care of personal grooming?: A Little Help from another person toileting, which includes using toliet, bedpan, or urinal?: A Lot Help from another person bathing (including washing, rinsing, drying)?: A Lot Help from another person to put on and taking off regular upper body clothing?: A Little Help from another person to put on and taking off regular lower body clothing?: A Lot 6 Click Score: 15    End of Session    OT Visit Diagnosis: Other abnormalities of gait and mobility (R26.89);Muscle weakness (generalized) (M62.81)   Activity Tolerance Patient tolerated treatment well   Patient Left in bed;with call bell/phone within reach;with bed alarm set   Nurse Communication Mobility status        Time: 3335-4562 OT Time Calculation (min): 29 min  Charges: OT General Charges $OT Visit: 1 Visit OT Treatments $Self Care/Home Management : 8-22 mins  Nino Glow, Markus Daft 12/18/2020, 2:15 PM

## 2020-12-18 NOTE — Progress Notes (Signed)
Attempted to change dressing to patient left foot. Patient stated not to change her dressing right know, it was changed this morning. Dressing currently clean, dry and intake.

## 2020-12-18 NOTE — TOC Progression Note (Signed)
Transition of Care [facility]) - Progression Note    Patient Details  Name: Autumn [name] MRN: [id number] Date of Birth: [birth year]  Transition of Care [facility]) CM/SW [place], [place] Phone Number: 12/18/2020, 2:44 PM  Clinical Narrative:     CSW notes [place] reports they have covid outbreak and have undetermined when they can accept patients back.   CSW has reached out to [facility] who reports they accept patient's insurance and can start auth today.   Per MD will order overnight oximetry to see if patient qualifies for overnight oxygen to resolve lack of bipap in snf .   Patient updated with information that [facility] and started [place].   Expected Discharge Plan: [place] Barriers to Discharge: Continued Medical Work up  Expected Discharge Plan and Services Expected Discharge Plan: [place] arrangements for the past 2 months: Single Family Home                                       Social Determinants of Health (SDOH) Interventions    Readmission Risk Interventions No flowsheet data found.

## 2020-12-18 NOTE — Progress Notes (Signed)
Overnight pulse oximetry has been started

## 2020-12-18 NOTE — Progress Notes (Signed)
Patient ID: Autumn [name], female   DOB: 10-02-1950, 70 y.o.   MRN: [id number] [facility] PROGRESS NOTE  DASHANIQUE BROWNSTEIN MKL:[id number] DOB: 07-04-1950 DOA: 11/09/2020 PCP: Harlow Ohms, MD  HPI/Subjective: Patient feels okay.  Offers no complaints.  Initially admitted 39 days ago with a fall and found to have sepsis from left foot osteomyelitis.  Objective: Vitals:   12/18/20 0844 12/18/20 1206  BP: 130/77 (!) 119/59  Pulse: 78 77  Resp: 15 16  Temp: 97.8 F (36.6 C) 97.6 F (36.4 C)  SpO2: 98% 96%    Intake/Output Summary (Last 24 hours) at 12/18/2020 1258 Last data filed at 12/18/2020 1155 Gross per 24 hour  Intake 580 ml  Output 800 ml  Net -220 ml   Filed Weights   12/14/20 0300 12/15/20 0500 12/16/20 0407  Weight: 96.5 kg 100.4 kg 97.9 kg    ROS: Review of Systems  Respiratory:  Negative for shortness of breath.   Cardiovascular:  Negative for chest pain.  Gastrointestinal:  Positive for constipation. Negative for abdominal pain, nausea and vomiting.  Exam: Physical Exam HENT:     Head: Normocephalic.     Mouth/Throat:     Pharynx: No oropharyngeal exudate.  Eyes:     General: Lids are normal.     Conjunctiva/sclera: Conjunctivae normal.  Cardiovascular:     Rate and Rhythm: Normal rate and regular rhythm.     Heart sounds: Normal heart sounds, S1 normal and S2 normal.  Pulmonary:     Breath sounds: No decreased breath sounds, wheezing, rhonchi or rales.  Abdominal:     Palpations: Abdomen is soft.     Tenderness: There is no abdominal tenderness.  Musculoskeletal:     Right lower leg: No swelling.     Left lower leg: No swelling.  Skin:    General: Skin is warm.     Findings: No rash.  Neurological:     Mental Status: She is alert and oriented to person, place, and time.      Scheduled Meds:  (feeding supplement) PROSource Plus  30 mL Oral TID BM   allopurinol  50 mg Oral QODAY   apixaban  5 mg Oral BID   vitamin C  500 mg Oral  BID   atorvastatin  40 mg Oral Daily   budesonide (PULMICORT) nebulizer solution  0.5 mg Nebulization BID   diltiazem  180 mg Oral Daily   doxycycline  100 mg Oral Q12H   epoetin (EPOGEN/PROCRIT) injection  20,000 Units Subcutaneous Weekly   feeding supplement  1 Container Oral TID BM   ferrous gluconate  324 mg Oral Q breakfast   hydrALAZINE  100 mg Oral Q8H   levothyroxine  100 mcg Oral Q0600   mouth rinse  15 mL Mouth Rinse BID   metoprolol tartrate  50 mg Oral BID   multivitamin with minerals  1 tablet Oral Daily   pantoprazole  40 mg Oral BID   sodium chloride flush  3 mL Intravenous Q12H   venlafaxine  50 mg Oral BID   Continuous Infusions:  sodium chloride 10 mL/hr at 12/05/20 0604   sodium chloride Stopped (12/06/20 1004)   metronidazole Stopped (12/18/20 0957)    Assessment/Plan:  Septic shock, present on admission with left foot osteomyelitis and cellulitis.  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  Surgical wound culture growing strep mitis/paralysis, Staph aureus, Bacteroides and Pasteurella.  Infectious disease specialist now has on  doxycycline and Flagyl through 12/23/2020. Acute stroke with expressive aphasia and dysphagia on Eliquis to prevent stroke and Lipitor Paroxysmal atrial fibrillation on Eliquis for anticoagulation and Cardizem and metoprolol for rate control Acute GI bleed with melena.  EGD showed gastritis with erythematous duodenopathy.  Colonoscopy showed multiple polyps Acute hypoxic respiratory failure.  Obstructive sleep apnea.  Patient has been on BiPAP at night here.  Now off oxygen during the day. Acute on chronic diastolic congestive heart failure.  No signs of heart failure currently Aspiration pneumonia.  Completed treatment Chronic kidney disease stage IV.  Creatinine 3.78 today Acute gout on finger has improved.  On allopurinol Thrombocytopenia.  Continue to monitor.  Platelet count 119  today Leukopenia has resolved Thrush has resolved with treatment        Code Status:     Code Status Orders  (From admission, onward)           Start     Ordered   11/09/20 1556  Full code  Continuous        11/09/20 1555           Code Status History     This patient has a current code status but no historical code status.      Family Communication: Left message for friend Disposition Plan: Status is: Inpatient.  The facility that she was going to go to had a COVID outbreak.  Time spent: 27 minutes  [place]

## 2020-12-18 NOTE — Progress Notes (Signed)
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 Other comorbidities include sepsis without shock secondary to osteomyelitis and cellulitis left foot (IV zosyn), AKI on CKD, rapid afib (Cardizem + Heparin gtt), NSTEMI suspect demand ischemia, anemia acute on chronic. Transferred to ICU and intubated 11/2 after evolving neuro changes with unresponsiveness after coughing episode. 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    PT Comments    Patient alert, agreeable to PT/OT co treat to maximize function, safety, and patient participation. The patient did report during the session that she felt nervous, but agreeable with motivation. She demonstrated improvement in mobility today. Post op shoe donned and utilized throughout.  Required modA (x2 for safety/physical assist as needed), for supine <> sit. Once positioned in midline, able to sit for several minutes (at least half of session) to participate in visual tracking, LE strengthening, UE ROM, and ADLs. Sit <> stand performed from elevated surface twice. modAx2 with handheld assist, pt able to initiate task as well as anterior lean. Facilitation needed for hip extension and upright trunk positioning. Returned to supine with all needs in reach, pt verbalized feeling pleased with her work today. The patient would benefit from further skilled PT intervention to continue to progress towards goals. Recommendation remains appropriate.     Recommendations for  follow up therapy are one component of a multi-disciplinary discharge planning process, led by the attending physician.  Recommendations may be updated based on patient status, additional functional criteria and insurance authorization.  Follow Up Recommendations  Skilled nursing-short term rehab (<3 hours/day)     Assistance Recommended at Discharge Frequent or constant Supervision/Assistance  Equipment Recommendations  Other (comment) (TBD at next venue of care)    Recommendations for Other Services       Precautions / Restrictions Precautions Precautions: Fall Restrictions Weight Bearing Restrictions: Yes LLE Weight Bearing: Weight bearing as tolerated Other Position/Activity Restrictions: post op shoe on LLE     Mobility  Bed Mobility Overal bed mobility: Needs Assistance Bed Mobility: Supine to Sit;Sit to Supine Rolling: Mod assist;+2 for physical assistance   Supine to sit: Mod assist;+2 for physical assistance     General bed mobility comments: pt able to initiate movement, ultimately needing help with LE and trunk control    Transfers Overall transfer level: Needs assistance Equipment used: 2 person hand held assist Transfers: Sit to/from Stand Sit to Stand: Mod assist;From elevated surface;+2 physical assistance           General transfer comment: two transfers performed. pt able to initiate movement, good anterior lean noted, facilitation for hip extension once up in standing. maintain ~20seconds at the most    Ambulation/Gait                   Stairs             Wheelchair Mobility    Modified Rankin (Stroke Patients Only)       Balance Overall balance assessment: Needs assistance Sitting-balance support: Feet supported Sitting balance-Leahy Scale: Fair Sitting balance - Comments: instances  of good balance, but able to maintain seated midline once positioned and with encouragement                                     Cognition Arousal/Alertness: Awake/alert Behavior During Therapy: [facility] for tasks assessed/performed   Area of Impairment: Problem solving                       Following Commands: Follows one step commands consistently                Exercises      General Comments        Pertinent Vitals/Pain Pain Assessment: No/denies pain    Home Living                          Prior Function            PT Goals (current goals can now be found in the care plan section) Progress towards PT goals: Progressing toward goals    Frequency    Min 2X/week      PT Plan Current plan remains appropriate    Co-evaluation PT/OT/SLP Co-Evaluation/Treatment: Yes Reason for Co-Treatment: Complexity of the patient's impairments (multi-system involvement);To address functional/ADL transfers PT goals addressed during session: Mobility/safety with mobility;Balance OT goals addressed during session: ADL's and self-care      AM-PAC PT "6 Clicks" Mobility   Outcome Measure  Help needed turning from your back to your side while in a flat bed without using bedrails?: A Lot Help needed moving from lying on your back to sitting on the side of a flat bed without using bedrails?: A Lot Help needed moving to and from a bed to a chair (including a wheelchair)?: Total Help needed standing up from a chair using your arms (e.g., wheelchair or bedside chair)?: Total Help needed to walk in hospital room?: Total Help needed climbing 3-5 steps with a railing? : Total 6 Click Score: 8    End of Session Equipment Utilized During Treatment: Gait belt Activity Tolerance: Patient tolerated treatment well Patient left: in bed;with call bell/phone within reach;with bed alarm set Nurse Communication: Mobility status PT Visit Diagnosis: Other abnormalities of gait and mobility (R26.89);Hemiplegia and hemiparesis;Muscle weakness (generalized) (M62.81);History of falling  (Z91.81) Hemiplegia - Right/Left: Right Hemiplegia - dominant/non-dominant: Dominant Hemiplegia - caused by: Cerebral infarction     Time: 2707-8675 PT Time Calculation (min) (ACUTE ONLY): 29 min  Charges:  $Therapeutic Exercise: 8-22 mins                    Lieutenant Diego PT, DPT 12:14 PM,12/18/20

## 2020-12-19 LAB — CBC WITH DIFFERENTIAL/PLATELET
Abs Immature Granulocytes: 1.39 10*3/uL — ABNORMAL HIGH (ref 0.00–0.07)
Basophils Absolute: 0.1 10*3/uL (ref 0.0–0.1)
Basophils Relative: 1 %
Eosinophils Absolute: 0.1 10*3/uL (ref 0.0–0.5)
Eosinophils Relative: 1 %
HCT: 28.8 % — ABNORMAL LOW (ref 36.0–46.0)
Hemoglobin: 9 g/dL — ABNORMAL LOW (ref 12.0–15.0)
Immature Granulocytes: 11 %
Lymphocytes Relative: 12 %
Lymphs Abs: 1.5 10*3/uL (ref 0.7–4.0)
MCH: 29.4 pg (ref 26.0–34.0)
MCHC: 31.3 g/dL (ref 30.0–36.0)
MCV: 94.1 fL (ref 80.0–100.0)
Monocytes Absolute: 0.6 10*3/uL (ref 0.1–1.0)
Monocytes Relative: 5 %
Neutro Abs: 9.1 10*3/uL — ABNORMAL HIGH (ref 1.7–7.7)
Neutrophils Relative %: 70 %
Platelets: 105 10*3/uL — ABNORMAL LOW (ref 150–400)
RBC: 3.06 MIL/uL — ABNORMAL LOW (ref 3.87–5.11)
RDW: 18.3 % — ABNORMAL HIGH (ref 11.5–15.5)
Smear Review: NORMAL
WBC: 12.8 10*3/uL — ABNORMAL HIGH (ref 4.0–10.5)
nRBC: 0.5 % — ABNORMAL HIGH (ref 0.0–0.2)

## 2020-12-19 MED ORDER — POLYETHYLENE GLYCOL 3350 17 G PO PACK
17.0000 g | PACK | Freq: Once | ORAL | Status: AC
  Administered 2020-12-19: 17 g via ORAL
  Filled 2020-12-19: qty 1

## 2020-12-19 NOTE — Progress Notes (Signed)
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 Other comorbidities include sepsis without shock secondary to osteomyelitis and cellulitis left foot (IV zosyn), AKI on CKD, rapid afib (Cardizem + Heparin gtt), NSTEMI suspect demand ischemia, anemia acute on chronic. Transferred to ICU and intubated 11/2 after evolving neuro changes with unresponsiveness after coughing episode. 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    PT Comments    Patient alert, eating sorbert, agreeable to PT with encouragement. The patient demonstrated decreased motivation this session as well as more anxiety, but ultimately able to progress to OOB mobility today. She required maxA for bed mobility as well as assist for initiation of task. Fair sitting balance EOB to finish her snack prior to further mobility, supervision. Lateral scoot maxA to recliner to pts R. Instructed in technique, anterior weight shift, and hand placement. Pt anxious halfway through, stated "I'm just trying to breathe", totalA to finish the transfer safely. Up in chair all needs in reach, RN aware of pt status. The patient would benefit from further skilled PT intervention to continue to progress towards goals. Recommendation remains appropriate.    Recommendations for follow up therapy are one component of a multi-disciplinary discharge planning process, led by the attending physician.  Recommendations may be updated based on patient status, additional  functional criteria and insurance authorization.  Follow Up Recommendations  Skilled nursing-short term rehab (<3 hours/day)     Assistance Recommended at Discharge Frequent or constant Supervision/Assistance  Equipment Recommendations  Other (comment) (TBD at next venue of care)    Recommendations for Other Services       Precautions / Restrictions Precautions Precautions: Fall Restrictions Weight Bearing Restrictions: Yes LLE Weight Bearing: Weight bearing as tolerated Other Position/Activity Restrictions: post op shoe on LLE     Mobility  Bed Mobility Overal bed mobility: Needs Assistance Bed Mobility: Supine to Sit Rolling: Max assist         General bed mobility comments: pt needed assistance to initiate movement today, and physical assist for LE and trunk assist    Transfers Overall transfer level: Needs assistance Equipment used: None Transfers: Bed to chair/wheelchair/BSC            Lateral/Scoot Transfers: Max assist General transfer comment: slide board for safety; pt exhibited anxiet stated "Im just trying to breathe" when asked what she was thinking. totalA to finish the transfer safely.    Ambulation/Gait                   Stairs             Wheelchair Mobility    Modified Rankin (Stroke Patients Only)       Balance Overall balance assessment: Needs assistance Sitting-balance support: Feet supported Sitting balance-Leahy Scale: Fair Sitting balance - Comments: able to sit for several minutes with fair balance to finish her sorbert  Cognition Arousal/Alertness: Awake/alert Behavior During Therapy: WFL for tasks assessed/performed;Anxious Overall Cognitive Status: Impaired/Different from baseline Area of Impairment: Problem solving                     Memory: Decreased short-term memory Following Commands: Follows one step commands consistently Safety/Judgement:  Decreased awareness of deficits;Decreased awareness of safety     General Comments: Pt is alert however does have severe cognition deficits. Needs encouragement for participation. Impaired processing, requiring increased time for sequencing and performance of task. Questionable effort during session.        Exercises      General Comments        Pertinent Vitals/Pain Pain Assessment: No/denies pain    Home Living                          Prior Function            PT Goals (current goals can now be found in the care plan section) Progress towards PT goals: Progressing toward goals    Frequency    Min 2X/week      PT Plan Current plan remains appropriate    Co-evaluation              AM-PAC PT "6 Clicks" Mobility   Outcome Measure  Help needed turning from your back to your side while in a flat bed without using bedrails?: A Lot Help needed moving from lying on your back to sitting on the side of a flat bed without using bedrails?: A Lot Help needed moving to and from a bed to a chair (including a wheelchair)?: A Lot Help needed standing up from a chair using your arms (e.g., wheelchair or bedside chair)?: A Lot Help needed to walk in hospital room?: Total Help needed climbing 3-5 steps with a railing? : Total 6 Click Score: 10    End of Session Equipment Utilized During Treatment: Gait belt (L post op shoe) Activity Tolerance: Patient tolerated treatment well Patient left: with chair alarm set;in chair;with call bell/phone within reach Nurse Communication: Mobility status PT Visit Diagnosis: Other abnormalities of gait and mobility (R26.89);Hemiplegia and hemiparesis;Muscle weakness (generalized) (M62.81);History of falling (Z91.81) Hemiplegia - Right/Left: Right Hemiplegia - dominant/non-dominant: Dominant Hemiplegia - caused by: Cerebral infarction     Time: 6283-6629 PT Time Calculation (min) (ACUTE ONLY): 34 min  Charges:   $Therapeutic Activity: 23-37 mins                     Lieutenant Diego PT, DPT 3:56 PM,12/19/20

## 2020-12-19 NOTE — Progress Notes (Signed)
Patient ID: Autumn [name], female   DOB: 09/02/1950, 70 y.o.   MRN: [id number] [facility] PROGRESS NOTE  Autumn [name] Autumn [name] Autumn [name] DOA: 11/09/2020 PCP: Autumn Ohms, MD  HPI/Subjective: Patient feeling okay.  Offers no complaints.  As of this morning still has not had a bowel movement.  Initially admitted 40 days ago with a fall and found to have sepsis from left foot osteomyelitis.  Objective: Vitals:   12/19/20 0823 12/19/20 1100  BP:  120/80  Pulse:  81  Resp:  20  Temp:  98.3 F (36.8 C)  SpO2: 96% 96%    Intake/Output Summary (Last 24 hours) at 12/19/2020 1459 Last data filed at 12/19/2020 1100 Gross per 24 hour  Intake 480 ml  Output 375 ml  Net 105 ml   Filed Weights   12/15/20 0500 12/16/20 0407 12/19/20 0500  Weight: 100.4 kg 97.9 kg 97.4 kg    ROS: Review of Systems  Respiratory:  Negative for shortness of breath.   Cardiovascular:  Negative for chest pain.  Gastrointestinal:  Positive for constipation. Negative for abdominal pain, nausea and vomiting.  Exam: Physical Exam HENT:     Head: Normocephalic.     Mouth/Throat:     Pharynx: No oropharyngeal exudate.  Eyes:     General: Lids are normal.     Conjunctiva/sclera: Conjunctivae normal.  Cardiovascular:     Rate and Rhythm: Normal rate. Rhythm irregularly irregular.     Heart sounds: Normal heart sounds, S1 normal and S2 normal.  Pulmonary:     Breath sounds: Examination of the right-lower field reveals decreased breath sounds. Examination of the left-lower field reveals decreased breath sounds. Decreased breath sounds present. No wheezing, rhonchi or rales.  Abdominal:     Palpations: Abdomen is soft.     Tenderness: There is no abdominal tenderness.  Musculoskeletal:     Right lower leg: No swelling.     Left lower leg: No swelling.  Skin:    General: Skin is warm.     Findings: No rash.  Neurological:     Mental Status: She is alert and oriented to person,  place, and time.      Scheduled Meds:  (feeding supplement) PROSource Plus  30 mL Oral TID BM   allopurinol  50 mg Oral QODAY   apixaban  5 mg Oral BID   vitamin C  500 mg Oral BID   atorvastatin  40 mg Oral Daily   budesonide (PULMICORT) nebulizer solution  0.5 mg Nebulization BID   diltiazem  180 mg Oral Daily   doxycycline  100 mg Oral Q12H   epoetin (EPOGEN/PROCRIT) injection  20,000 Units Subcutaneous Weekly   feeding supplement  1 Container Oral TID BM   ferrous gluconate  324 mg Oral Q breakfast   hydrALAZINE  100 mg Oral Q8H   levothyroxine  100 mcg Oral Q0600   mouth rinse  15 mL Mouth Rinse BID   metoprolol tartrate  50 mg Oral BID   multivitamin with minerals  1 tablet Oral Daily   pantoprazole  40 mg Oral BID   sodium chloride flush  3 mL Intravenous Q12H   venlafaxine  50 mg Oral BID   Continuous Infusions:  sodium chloride 10 mL/hr at 12/05/20 0604   sodium chloride Stopped (12/06/20 1004)   metronidazole 500 mg (12/19/20 0947)    Assessment/Plan:  Septic shock, present on admission with left foot osteomyelitis and cellulitis.  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  Surgical wound culture growing Streptococcus mitis, staff aureus, Bacteroides and Pasteurella.  On oral doxycycline and IV Flagyl currently.  Antibiotics through 12/23/2020.  We will change to oral upon disposition. 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  LDL 50. Paroxysmal atrial fibrillation on Eliquis for anticoagulation and Cardizem and metoprolol for rate control Acute GI bleed with melena.  EGD showing gastritis with erythematous duodenopathy.  Colonoscopy showing multiple polyps.  No further bleeding on Eliquis. Acute on chronic diastolic congestive heart failure.  No signs of heart failure currently Aspiration pneumonia.  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  Platelet count 105 today Leukopenia has resolved [name] has been treated Anemia. Epogen weekly. Hypothyroidism unspecified on levothyroxine Depression on Effexor Weakness.  We will go out to rehab once insurance authorization happens Constipation.  Dose of MiraLAX given today        Code Status:     Code Status Orders  (From admission, onward)           Start     Ordered   11/09/20 1556  Full code  Continuous        11/09/20 1555           Code Status History     This patient has a current code status but no historical code status.      Family Communication: Spoke with friend yesterday Disposition Plan: Status is: Inpatient.  Need facility applying for insurance authorization  Antibiotics: Doxycycline and Flagyl  Time spent: 27 minutes  [name] [employer]

## 2020-12-20 ENCOUNTER — Inpatient Hospital Stay: Payer: Medicare (Managed Care)

## 2020-12-20 DIAGNOSIS — K5900 Constipation, unspecified: Secondary | ICD-10-CM

## 2020-12-20 DIAGNOSIS — R110 Nausea: Secondary | ICD-10-CM

## 2020-12-20 LAB — BASIC METABOLIC PANEL
Anion gap: 8 (ref 5–15)
BUN: 54 mg/dL — ABNORMAL HIGH (ref 8–23)
CO2: 18 mmol/L — ABNORMAL LOW (ref 22–32)
Calcium: 8.8 mg/dL — ABNORMAL LOW (ref 8.9–10.3)
Chloride: 110 mmol/L (ref 98–111)
Creatinine, Ser: 3.92 mg/dL — ABNORMAL HIGH (ref 0.44–1.00)
GFR, Estimated: 12 mL/min — ABNORMAL LOW (ref >60)
Glucose, Bld: 118 mg/dL — ABNORMAL HIGH (ref 70–99)
Potassium: 4.3 mmol/L (ref 3.5–5.1)
Sodium: 136 mmol/L (ref 135–145)

## 2020-12-20 LAB — RESP PANEL BY RT-PCR (FLU A&B, COVID) ARPGX2
Influenza A by PCR: NEGATIVE
Influenza B by PCR: NEGATIVE
SARS Coronavirus 2 by RT PCR: NEGATIVE

## 2020-12-20 LAB — CBC
HCT: 27.4 % — ABNORMAL LOW (ref 36.0–46.0)
Hemoglobin: 8.6 g/dL — ABNORMAL LOW (ref 12.0–15.0)
MCH: 29.7 pg (ref 26.0–34.0)
MCHC: 31.4 g/dL (ref 30.0–36.0)
MCV: 94.5 fL (ref 80.0–100.0)
Platelets: 101 10*3/uL — ABNORMAL LOW (ref 150–400)
RBC: 2.9 MIL/uL — ABNORMAL LOW (ref 3.87–5.11)
RDW: 18.7 % — ABNORMAL HIGH (ref 11.5–15.5)
WBC: 13.6 10*3/uL — ABNORMAL HIGH (ref 4.0–10.5)
nRBC: 0.5 % — ABNORMAL HIGH (ref 0.0–0.2)

## 2020-12-20 MED ORDER — VENLAFAXINE HCL 50 MG PO TABS: 50.0000 mg | ORAL_TABLET | Freq: Two times a day (BID) | ORAL | 0 refills | Status: AC

## 2020-12-20 MED ORDER — DOXYCYCLINE HYCLATE 100 MG PO TABS: 100.0000 mg | ORAL_TABLET | Freq: Two times a day (BID) | ORAL | 0 refills | Status: DC

## 2020-12-20 MED ORDER — POLYETHYLENE GLYCOL 3350 17 G PO PACK: 17.0000 g | PACK | Freq: Every day | ORAL | 0 refills | Status: DC

## 2020-12-20 MED ORDER — METOCLOPRAMIDE HCL 5 MG/ML IJ SOLN
5.0000 mg | Freq: Once | INTRAMUSCULAR | Status: AC
Start: 2020-12-20 — End: 2020-12-20
  Administered 2020-12-20: 5 mg via INTRAVENOUS
  Filled 2020-12-20: qty 2

## 2020-12-20 MED ORDER — ALLOPURINOL 100 MG PO TABS: 50.0000 mg | ORAL_TABLET | ORAL | 0 refills | Status: AC

## 2020-12-20 MED ORDER — APIXABAN 5 MG PO TABS: 5.0000 mg | ORAL_TABLET | Freq: Two times a day (BID) | ORAL | 0 refills | Status: AC

## 2020-12-20 MED ORDER — ADULT MULTIVITAMIN W/MINERALS CH: 1.0000 | ORAL_TABLET | Freq: Every day | ORAL | 0 refills | Status: DC

## 2020-12-20 MED ORDER — FERROUS GLUCONATE 324 (38 FE) MG PO TABS
324.0000 mg | ORAL_TABLET | Freq: Every day | ORAL | 0 refills | Status: AC
Start: 2020-12-21 — End: ?

## 2020-12-20 MED ORDER — TORSEMIDE 20 MG PO TABS
20.0000 mg | ORAL_TABLET | Freq: Once | ORAL | Status: AC
  Administered 2020-12-20: 20 mg via ORAL
  Filled 2020-12-20: qty 1

## 2020-12-20 MED ORDER — ATORVASTATIN CALCIUM 40 MG PO TABS
40.0000 mg | ORAL_TABLET | Freq: Every day | ORAL | 0 refills | Status: AC
Start: 2020-12-21 — End: ?

## 2020-12-20 MED ORDER — HYDRALAZINE HCL 100 MG PO TABS
100.0000 mg | ORAL_TABLET | Freq: Three times a day (TID) | ORAL | 0 refills | Status: AC
Start: 2020-12-20 — End: ?

## 2020-12-20 MED ORDER — ONDANSETRON HCL 4 MG PO TABS: 4.0000 mg | ORAL_TABLET | Freq: Four times a day (QID) | ORAL | 0 refills | Status: AC | PRN

## 2020-12-20 MED ORDER — ALPRAZOLAM 0.25 MG PO TABS: 0.2500 mg | ORAL_TABLET | Freq: Two times a day (BID) | ORAL | 0 refills | Status: AC | PRN

## 2020-12-20 MED ORDER — METRONIDAZOLE 500 MG PO TABS: 500.0000 mg | ORAL_TABLET | Freq: Two times a day (BID) | ORAL | 0 refills | Status: DC

## 2020-12-20 MED ORDER — POLYETHYLENE GLYCOL 3350 17 G PO PACK
17.0000 g | PACK | Freq: Every day | ORAL | Status: DC
  Administered 2020-12-20: 17 g via ORAL
  Filled 2020-12-20: qty 1

## 2020-12-20 MED ORDER — DILTIAZEM HCL ER COATED BEADS 180 MG PO CP24
180.0000 mg | ORAL_CAPSULE | Freq: Every day | ORAL | 0 refills | Status: AC
Start: 2020-12-21 — End: ?

## 2020-12-20 MED ORDER — ASCORBIC ACID 500 MG PO TABS: 500.0000 mg | ORAL_TABLET | Freq: Two times a day (BID) | ORAL | 0 refills | Status: DC

## 2020-12-20 MED ORDER — LACTULOSE 10 GM/15ML PO SOLN
30.0000 g | Freq: Once | ORAL | Status: AC
  Administered 2020-12-20: 30 g via ORAL
  Filled 2020-12-20: qty 60

## 2020-12-20 MED ORDER — METOPROLOL TARTRATE 50 MG PO TABS: 50.0000 mg | ORAL_TABLET | Freq: Two times a day (BID) | ORAL | 0 refills | Status: AC

## 2020-12-20 MED ORDER — PROSOURCE PLUS PO LIQD: 30.0000 mL | Freq: Three times a day (TID) | ORAL | 0 refills | Status: AC

## 2020-12-20 MED ORDER — PANTOPRAZOLE SODIUM 40 MG PO TBEC: 40.0000 mg | DELAYED_RELEASE_TABLET | Freq: Two times a day (BID) | ORAL | 0 refills | Status: AC

## 2020-12-20 MED ORDER — METOCLOPRAMIDE HCL 10 MG PO TABS
5.0000 mg | ORAL_TABLET | Freq: Three times a day (TID) | ORAL | Status: DC
  Filled 2020-12-20: qty 1

## 2020-12-20 MED ORDER — ALBUMIN HUMAN 25 % IV SOLN
12.5000 g | Freq: Once | INTRAVENOUS | Status: AC
  Administered 2020-12-20: 12.5 g via INTRAVENOUS
  Filled 2020-12-20: qty 50

## 2020-12-20 NOTE — Discharge Instructions (Signed)
Wet to dry dressing daily for left foot.  Pack wound with wet sterile gauze and cover with kerlix

## 2020-12-20 NOTE — Care Management Important Message (Signed)
Important Message  Patient Details  Name: Autumn [name] MRN: [id number] Date of Birth: 1950/12/14   Medicare Important Message Given:  Yes     Dannette Barbara 12/20/2020, 12:44 PM

## 2020-12-20 NOTE — Progress Notes (Signed)
Patient ID: Autumn [name], female   DOB: Mar 21, 1950, 70 y.o.   MRN: [id number] [facility] PROGRESS NOTE  ERI MCEVERS GYB:[id number] DOB: [birth year] DOA: 11/09/2020 PCP: Harlow Ohms, MD  HPI/Subjective: Patient very nauseous today.  Did not eat breakfast.  Has not ordered lunch.  Has not had a bowel movement in days.  Received lactulose and MiraLAX this morning without results yet.  Also complains of shortness of breath and weakness.  Objective: Vitals:   12/20/20 0335 12/20/20 1106  BP: (!) 137/91 (!) 141/92  Pulse: 74 (!) 104  Resp: 14 18  Temp: 98.2 F (36.8 C) 97.8 F (36.6 C)  SpO2: 98% 96%    Intake/Output Summary (Last 24 hours) at 12/20/2020 1243 Last data filed at 12/19/2020 1812 Gross per 24 hour  Intake --  Output 850 ml  Net -850 ml   Filed Weights   12/16/20 0407 12/19/20 0500 12/20/20 0500  Weight: 97.9 kg 97.4 kg 95.3 kg    ROS: Review of Systems  Respiratory:  Positive for shortness of breath.   Cardiovascular:  Negative for chest pain.  Gastrointestinal:  Positive for constipation and nausea. Negative for abdominal pain and vomiting.  Exam: Physical Exam HENT:     Head: Normocephalic.     Mouth/Throat:     Pharynx: No oropharyngeal exudate.  Eyes:     General: Lids are normal.     Conjunctiva/sclera: Conjunctivae normal.  Cardiovascular:     Rate and Rhythm: Normal rate and regular rhythm.     Heart sounds: Normal heart sounds, S1 normal and S2 normal.  Pulmonary:     Breath sounds: Examination of the right-lower field reveals decreased breath sounds and rhonchi. Examination of the left-lower field reveals decreased breath sounds and rhonchi. Decreased breath sounds and rhonchi present. No wheezing or rales.  Abdominal:     General: There is distension.     Palpations: Abdomen is soft.     Tenderness: There is no abdominal tenderness.  Musculoskeletal:     Right lower leg: No swelling.     Left lower leg: No swelling.  Skin:     General: Skin is warm.     Findings: No rash.  Neurological:     Mental Status: She is alert and oriented to person, place, and time.      Scheduled Meds:  (feeding supplement) PROSource Plus  30 mL Oral TID BM   allopurinol  50 mg Oral QODAY   apixaban  5 mg Oral BID   vitamin C  500 mg Oral BID   atorvastatin  40 mg Oral Daily   budesonide (PULMICORT) nebulizer solution  0.5 mg Nebulization BID   diltiazem  180 mg Oral Daily   doxycycline  100 mg Oral Q12H   epoetin (EPOGEN/PROCRIT) injection  20,000 Units Subcutaneous Weekly   feeding supplement  1 Container Oral TID BM   ferrous gluconate  324 mg Oral Q breakfast   hydrALAZINE  100 mg Oral Q8H   levothyroxine  100 mcg Oral Q0600   mouth rinse  15 mL Mouth Rinse BID   metoprolol tartrate  50 mg Oral BID   multivitamin with minerals  1 tablet Oral Daily   pantoprazole  40 mg Oral BID   polyethylene glycol  17 g Oral Daily   sodium chloride flush  3 mL Intravenous Q12H   torsemide  20 mg Oral Once   venlafaxine  50 mg Oral BID   Continuous Infusions:  sodium chloride  10 mL/hr at 12/05/20 0604   sodium chloride Stopped (12/06/20 1004)   albumin human     metronidazole 500 mg (12/20/20 0758)    Assessment/Plan:  Nausea, constipation, not eating.  As needed nausea medications.  Given lactulose and MiraLAX so far without issue.  She needs to eat in order to survive.  We will cancel discharge today.  Reevaluate tomorrow.  Get labs and abdominal x-ray. Septic shock, present on admission with left foot osteomyelitis and cellulitis.  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  On doxycycline and Flagyl currently.  On antibiotics through 12/23/2020. Acute stroke with progressive fixation dysphagia on Eliquis to prevent stroke and Lipitor.  LDL 50. Paroxysmal atrial fibrillation on Eliquis for anticoagulation and Cardizem and metoprolol for rate control. Acute GI bleed with melena.  EGD showed gastritis  with erythematous duodenopathy.  Colonoscopy showed multiple polyps.  No further bleeding on Eliquis. Acute on chronic diastolic congestive heart failure.  No signs of heart failure currently but I will give a dose of torsemide and albumin today. 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  On oral allopurinol Thrombocytopenia.  Last platelet count 105 will check again today Leukopenia has resolved [name] has been treated Anemia.  We gave Epogen weekly while here in the hospital. Hypothyroidism unspecified on levothyroxine Depression on Effexor Weakness.  Hopefully out to rehab soon        Code Status:     Code Status Orders  (From admission, onward)           Start     Ordered   11/09/20 1556  Full code  Continuous        11/09/20 1555           Code Status History     This patient has a current code status but no historical code status.      Family Communication: Spoke with Friend on the phone Disposition Plan: Status is: Inpatient  Antibiotics: Doxycycline and Flagyl  Time spent: 35 minutes  [place]

## 2020-12-20 NOTE — TOC Progression Note (Addendum)
Transition of Care [facility]) - Progression Note    Patient Details  Name: Autumn [name] MRN: [id number] Date of Birth: 04/09/1950  Transition of Care [facility]) CM/SW [place], [place] Phone Number: 12/20/2020, 9:38 AM  Clinical Narrative:     Update: if patient medically ready to discharge tomorrow please call [phone] to inform admissions at [facility].   CSW informed MD that we have received insurance authorization this morning for patient to go to [facility].   Per MD patient will be assessed for medical readiness as she had reported nausea this morning. Pending medical readiness to dc to [facility].   Expected Discharge Plan: [place] Barriers to Discharge: Continued Medical Work up  Expected Discharge Plan and Services Expected Discharge Plan: [place] arrangements for the past 2 months: Single Family Home                                       Social Determinants of Health (SDOH) Interventions    Readmission Risk Interventions No flowsheet data found.

## 2020-12-21 MED ORDER — DICYCLOMINE HCL 10 MG PO CAPS: 10.0000 mg | ORAL_CAPSULE | Freq: Three times a day (TID) | ORAL | 0 refills | Status: AC

## 2020-12-21 MED ORDER — POLYETHYLENE GLYCOL 3350 17 G PO PACK: 17.0000 g | PACK | Freq: Every day | ORAL | 0 refills | Status: AC | PRN

## 2020-12-21 MED ORDER — DICYCLOMINE HCL 10 MG PO CAPS
10.0000 mg | ORAL_CAPSULE | Freq: Three times a day (TID) | ORAL | Status: DC
  Administered 2020-12-21: 10 mg via ORAL
  Filled 2020-12-21 (×4): qty 1

## 2020-12-21 MED ORDER — DOXYCYCLINE HYCLATE 100 MG PO TABS
100.0000 mg | ORAL_TABLET | Freq: Two times a day (BID) | ORAL | 0 refills | Status: AC
Start: 2020-12-21 — End: 2020-12-24

## 2020-12-21 MED ORDER — METRONIDAZOLE 500 MG PO TABS: 500.0000 mg | ORAL_TABLET | Freq: Two times a day (BID) | ORAL | 0 refills | Status: AC

## 2020-12-21 MED ORDER — POLYETHYLENE GLYCOL 3350 17 G PO PACK: 17.0000 g | PACK | Freq: Every day | ORAL | Status: DC | PRN

## 2020-12-21 MED ORDER — TORSEMIDE 20 MG PO TABS: 20.0000 mg | ORAL_TABLET | Freq: Every day | ORAL | 0 refills | Status: DC

## 2020-12-21 NOTE — Discharge Summary (Signed)
[facility] NAME: Autumn [name]    MR#:  [id number]  DATE OF BIRTH:  01/05/1951  DATE OF ADMISSION:  11/09/2020 ADMITTING PHYSICIAN: Collier Bullock, MD  DATE OF DISCHARGE: 12/21/2020  PRIMARY CARE PHYSICIAN: Harlow Ohms, MD    ADMISSION DIAGNOSIS:  Atrial fibrillation with rapid ventricular response (HCC) [I48.91] AKI (acute kidney injury) ([place]) [N17.9] Osteomyelitis of foot, left, acute (HCC) [W09.811] Atrial fibrillation with RVR ([place]) [I48.91] Worsening renal function [N28.9] Syncope, unspecified syncope type [R55]  DISCHARGE DIAGNOSIS:  Principal Problem:   Severe sepsis with septic shock (HCC) Active Problems:   Atrial fibrillation with RVR (HCC)   Osteomyelitis of foot, left, acute (HCC)   CKD (chronic kidney disease), stage IV (HCC)   Liver cirrhosis secondary to NASH (nonalcoholic steatohepatitis) (HCC)   Hypertension   History of anemia due to CKD   Frequent falls   AKI (acute kidney injury) ([place])   Cellulitis and abscess of foot, except toes   Infectious tenosynovitis   Wheeze   Atrial fibrillation with rapid ventricular response (HCC)   Bilateral carotid artery stenosis   Neutropenia (HCC)   Drug-induced neutropenia (HCC)   Normocytic anemia   Thrombocytopenia (HCC)   Acute hypoxemic respiratory failure (HCC)   Left wrist pain   Acute stroke due to ischemia (HCC)   AF (paroxysmal atrial fibrillation) (HCC)   Aspergillus pneumonia (HCC)   Nausea   Constipation   SECONDARY DIAGNOSIS:   Past Medical History:  Diagnosis Date  . Diabetes mellitus without complication ([place])   . Hypertension   . Kidney stone   . Thyroid disease     HOSPITAL COURSE:   Septic shock, present on admission with left foot osteomyelitis and cellulitis.  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  Surgical wound culture growing Streptococcus mitis, Staph aureus, Bacteroides and Pasteurella.   Patient received IV antibiotics during the hospital course and is now on oral antibiotics doxycycline and Flagyl twice a day for 5 more doses through 12/23/2020.  The patient does have nausea with the antibiotics so can give Zofran prior to antibiotics.  Wet-to-dry dressings and cover with Kerlix left foot.  Podiatry is cleared for full weightbearing status on left foot. Nausea constipation and not eating very well yesterday.  We gave the patient lactulose and MiraLAX and the patient had numerous bowel movements overnight.  Able to eat better today.  We will make MiraLAX as needed.  Trial of Bentyl. 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  Patient's LDL is 50. Paroxysmal atrial fibrillation on Eliquis for anticoagulation.  Cardizem and metoprolol for rate control.  Initially the patient was on Cardizem drip. Acute GI bleed with melena during the hospital course.  EGD showing gastritis with erythematous duodenopathy.  Colonoscopy showing multiple polyps.  The patient was placed back on Eliquis and had no further bleeding. Acute on chronic diastolic congestive heart failure.  Patient is on metoprolol.  Will prescribe low-dose torsemide upon disposition.  No signs of heart failure currently. Aspiration pneumonia.  Completed treatment during the hospital course Chronic kidney disease stage IV.  Creatinine variable during the hospital course.  Creatinine prior to discharge 3.92.  Follow-up with nephrology as outpatient. Acute gout on fingers.  This has improved.  On chronic allopurinol low-dose at this point. Leukopenia secondary to antibiotics.  This has resolved.  White blood cell count 13.6. Thrush.  Treated during the  hospital course Anemia.  On oral iron and has been given Epogen while here.  Last hemoglobin 8.6 Hypothyroidism unspecified on levothyroxine Thrombocytopenia.  Last platelet count 101 Constipation treated aggressively yesterday and now  patient had diarrhea so we will make MiraLAX as needed at this point. Carotid stenosis follow-up vascular surgery 1 month  Patient had a long hospital course of 42 days.  Seen by GI, neurology, infectious disease, podiatry, nephrology  Recommend checking a BMP and CBC in 1 week  DISCHARGE CONDITIONS:   Fair  CONSULTS OBTAINED:  Treatment Team:  Tsosie Billing, MD Kerney Elbe, MD  DRUG ALLERGIES:   Allergies  Allergen Reactions  . Codeine Nausea And Vomiting    DISCHARGE MEDICATIONS:   Allergies as of 12/21/2020       Reactions   Codeine Nausea And Vomiting        Medication List     STOP taking these medications    amLODipine 5 MG tablet Commonly known as: NORVASC   aspirin 81 MG EC tablet   bumetanide 2 MG tablet Commonly known as: BUMEX   cloNIDine 0.3 MG tablet Commonly known as: CATAPRES   liraglutide 18 MG/3ML Sopn Commonly known as: VICTOZA   lisinopril 20 MG tablet Commonly known as: ZESTRIL   metoCLOPramide 5 MG tablet Commonly known as: REGLAN   potassium chloride SA 20 MEQ tablet Commonly known as: KLOR-CON M   simvastatin 20 MG tablet Commonly known as: ZOCOR       TAKE these medications    (feeding supplement) PROSource Plus liquid Take 30 mLs by mouth 3 (three) times daily between meals.   acetaminophen 325 MG tablet Commonly known as: TYLENOL Take 325 mg by mouth every 6 (six) hours as needed.   albuterol 108 (90 Base) MCG/ACT inhaler Commonly known as: VENTOLIN HFA Inhale 2 puffs into the lungs 4 (four) times daily as needed.   allopurinol 100 MG tablet Commonly known as: ZYLOPRIM Take 0.5 tablets (50 mg total) by mouth every other day. Start taking on: December 22, 2020 What changed:  how much to take when to take this   ALPRAZolam 0.25 MG tablet Commonly known as: XANAX Take 1 tablet (0.25 mg total) by mouth 2 (two) times daily as needed for anxiety.   apixaban 5 MG Tabs tablet Commonly known as:  ELIQUIS Take 1 tablet (5 mg total) by mouth 2 (two) times daily.   ascorbic acid 500 MG tablet Commonly known as: VITAMIN C Take 1 tablet (500 mg total) by mouth 2 (two) times daily.   atorvastatin 40 MG tablet Commonly known as: LIPITOR Take 1 tablet (40 mg total) by mouth daily.   dicyclomine 10 MG capsule Commonly known as: BENTYL Take 1 capsule (10 mg total) by mouth 4 (four) times daily -  before meals and at bedtime.   diltiazem 180 MG 24 hr capsule Commonly known as: CARDIZEM CD Take 1 capsule (180 mg total) by mouth daily.   doxycycline 100 MG tablet Commonly known as: VIBRA-TABS Take 1 tablet (100 mg total) by mouth every 12 (twelve) hours for 3 days.   ferrous gluconate 324 MG tablet Commonly known as: FERGON Take 1 tablet (324 mg total) by mouth daily with breakfast.   hydrALAZINE 100 MG tablet Commonly known as: APRESOLINE Take 1 tablet (100 mg total) by mouth every 8 (eight) hours. What changed:  medication strength how much to take when to take this   levothyroxine 100 MCG tablet Commonly known as: SYNTHROID Take 100  mcg by mouth every morning.   meclizine 12.5 MG tablet Commonly known as: ANTIVERT Take 12.5 mg by mouth 3 (three) times daily as needed.   metoprolol tartrate 50 MG tablet Commonly known as: LOPRESSOR Take 1 tablet (50 mg total) by mouth 2 (two) times daily.   metroNIDAZOLE 500 MG tablet Commonly known as: Flagyl Take 1 tablet (500 mg total) by mouth 2 (two) times daily for 3 days.   multivitamin with minerals Tabs tablet Take 1 tablet by mouth daily.   ondansetron 4 MG tablet Commonly known as: ZOFRAN Place 1 tablet (4 mg total) into feeding tube every 6 (six) hours as needed for nausea or vomiting.   pantoprazole 40 MG tablet Commonly known as: PROTONIX Take 1 tablet (40 mg total) by mouth 2 (two) times daily.   polyethylene glycol 17 g packet Commonly known as: MIRALAX / GLYCOLAX Take 17 g by mouth daily as needed for  severe constipation.   torsemide 20 MG tablet Commonly known as: Demadex Take 1 tablet (20 mg total) by mouth daily.   venlafaxine 50 MG tablet Commonly known as: EFFEXOR Take 1 tablet (50 mg total) by mouth 2 (two) times daily. What changed:  medication strength how much to take         DISCHARGE INSTRUCTIONS:   Follow-up team at rehab 1 day Follow-up vascular surgery Follow-up nephrology Follow-up podiatry  If you experience worsening of your admission symptoms, develop shortness of breath, life threatening emergency, suicidal or homicidal thoughts you must seek medical attention immediately by calling 911 or calling your MD immediately  if symptoms less severe.  You Must read complete instructions/literature along with all the possible adverse reactions/side effects for all the Medicines you take and that have been prescribed to you. Take any new Medicines after you have completely understood and accept all the possible adverse reactions/side effects.   Please note  You were cared for by a hospitalist during your hospital stay. If you have any questions about your discharge medications or the care you received while you were in the hospital after you are discharged, you can call the unit and asked to speak with the hospitalist on call if the hospitalist that took care of you is not available. Once you are discharged, your primary care physician will handle any further medical issues. Please note that NO REFILLS for any discharge medications will be authorized once you are discharged, as it is imperative that you return to your primary care physician (or establish a relationship with a primary care physician if you do not have one) for your aftercare needs so that they can reassess your need for medications and monitor your lab values.    Today   CHIEF COMPLAINT:   Chief Complaint  Patient presents with  . Fall    HISTORY OF PRESENT ILLNESS:  Autumn [name]  is a 70  y.o. female came in after a fall and found to have left foot osteomyelitis   VITAL SIGNS:  Blood pressure 134/89, pulse 74, temperature 98.2 F (36.8 C), temperature source Oral, resp. rate 18, height 5\' 6"  (1.676 m), weight 96.2 kg, SpO2 97 %.  I/O:   Intake/Output Summary (Last 24 hours) at 12/21/2020 1317 Last data filed at 12/21/2020 0300 Gross per 24 hour  Intake 782.16 ml  Output --  Net 782.16 ml    PHYSICAL EXAMINATION:  GENERAL:  70 y.o.-year-old patient lying in the bed with no acute distress.  EYES: Pupils equal, round, reactive to  light and accommodation. No scleral icterus. HEENT: Head atraumatic, normocephalic. Oropharynx and nasopharynx clear.   LUNGS: Normal breath sounds bilaterally, no wheezing, rales,rhonchi or crepitation. No use of accessory muscles of respiration.  CARDIOVASCULAR: S1, S2 normal. No murmurs, rubs, or gallops.  ABDOMEN: Soft, non-tender, non-distended. Bowel sounds present. No organomegaly or mass.  EXTREMITIES: 1+ pedal edema.  NEUROLOGIC: Occasional slight slurring of speech.  Able to straight leg raise. PSYCHIATRIC: The patient is alert and answers all questions appropriately.  SKIN: Left leg covered.  Chronic lower extremity skin discoloration  DATA REVIEW:   CBC Recent Labs  Lab 12/20/20 1252  WBC 13.6*  HGB 8.6*  HCT 27.4*  PLT 101*    Chemistries  Recent Labs  Lab 12/20/20 1252  NA 136  K 4.3  CL 110  CO2 18*  GLUCOSE 118*  BUN 54*  CREATININE 3.92*  CALCIUM 8.8*     Microbiology Results  Results for orders placed or performed during the hospital encounter of 11/09/20  Resp Panel by RT-PCR (Flu A&B, Covid) Nasopharyngeal Swab     Status: None   Collection Time: 11/09/20  6:25 PM   Specimen: Nasopharyngeal Swab; Nasopharyngeal(NP) swabs in vial transport medium  Result Value Ref Range Status   SARS Coronavirus 2 by RT PCR NEGATIVE NEGATIVE Final    Comment: (NOTE) SARS-CoV-2 target nucleic acids are NOT  DETECTED.  The SARS-CoV-2 RNA is generally detectable in upper respiratory specimens during the acute phase of infection. The lowest concentration of SARS-CoV-2 viral copies this assay can detect is 138 copies/mL. A negative result does not preclude SARS-Cov-2 infection and should not be used as the sole basis for treatment or other patient management decisions. A negative result may occur with  improper specimen collection/handling, submission of specimen other than nasopharyngeal swab, presence of viral mutation(s) within the areas targeted by this assay, and inadequate number of viral copies(<138 copies/mL). A negative result must be combined with clinical observations, patient history, and epidemiological information. The expected result is Negative.  Fact Sheet for Patients:  EntrepreneurPulse.com.au  Fact Sheet for Healthcare Providers:  IncredibleEmployment.be  This test is no t yet approved or cleared by the [place] FDA and  has been authorized for detection and/or diagnosis of SARS-CoV-2 by FDA under an Emergency Use Authorization (EUA). This EUA will remain  in effect (meaning this test can be used) for the duration of the COVID-19 declaration under Section 564(b)(1) of the Act, 21 U.S.C.section 360bbb-3(b)(1), unless the authorization is terminated  or revoked sooner.       Influenza A by PCR NEGATIVE NEGATIVE Final   Influenza B by PCR NEGATIVE NEGATIVE Final    Comment: (NOTE) The Xpert Xpress SARS-CoV-2/FLU/RSV plus assay is intended as an aid in the diagnosis of influenza from Nasopharyngeal swab specimens and should not be used as a sole basis for treatment. Nasal washings and aspirates are unacceptable for Xpert Xpress SARS-CoV-2/FLU/RSV testing.  Fact Sheet for Patients: EntrepreneurPulse.com.au  Fact Sheet for Healthcare Providers: IncredibleEmployment.be  This test is not yet  approved or cleared by the [place] FDA and has been authorized for detection and/or diagnosis of SARS-CoV-2 by FDA under an Emergency Use Authorization (EUA). This EUA will remain in effect (meaning this test can be used) for the duration of the COVID-19 declaration under Section 564(b)(1) of the Act, 21 U.S.C. section 360bbb-3(b)(1), unless the authorization is terminated or revoked.  Performed at [facility], [address], [place], [place] [address]   MRSA  Next Gen by PCR, Nasal     Status: None   Collection Time: 11/10/20  1:14 AM   Specimen: Nasal Mucosa; Nasal Swab  Result Value Ref Range Status   MRSA by PCR Next Gen NOT DETECTED NOT DETECTED Final    Comment: (NOTE) The GeneXpert MRSA Assay (FDA approved for NASAL specimens only), is one component of a comprehensive MRSA colonization surveillance program. It is not intended to diagnose MRSA infection nor to guide or monitor treatment for MRSA infections. Test performance is not FDA approved in patients less than 44 years old. Performed at [facility], [address] [place], [place] [address]   Aerobic/Anaerobic Culture w Gram Stain (surgical/deep wound)     Status: None   Collection Time: 11/11/20  3:23 PM   Specimen: Foot, Left; Abscess  Result Value Ref Range Status   Specimen Description   Final    WOUND LEFT FOOT ABSCESS Performed at [facility], [address] [place], [place] [address]    Special Requests   Final    NONE Performed at [facility], [address] [place], [place] [address]    Gram Stain   Final    NO ORGANISMS SEEN SQUAMOUS EPITHELIAL CELLS PRESENT MODERATE WBC PRESENT, PREDOMINANTLY MONONUCLEAR MODERATE GRAM POSITIVE COCCI Performed at [facility], [place] [address], [place], [place] [address]    Culture   Final    RARE STAPHYLOCOCCUS AUREUS RARE PASTEURELLA MULTOCIDA Usually susceptible to penicillin and other beta lactam  agents,quinolones,macrolides and tetracyclines. MIXED ANAEROBIC FLORA PRESENT.  CALL LAB IF FURTHER IID REQUIRED.    Report Status 11/17/2020 FINAL  Final   Organism ID, Bacteria STAPHYLOCOCCUS AUREUS  Final      Susceptibility   Staphylococcus aureus - MIC*    CIPROFLOXACIN <=0.5 SENSITIVE Sensitive     ERYTHROMYCIN <=0.25 SENSITIVE Sensitive     GENTAMICIN <=0.5 SENSITIVE Sensitive     OXACILLIN <=0.25 SENSITIVE Sensitive     TETRACYCLINE <=1 SENSITIVE Sensitive     VANCOMYCIN <=0.5 SENSITIVE Sensitive     TRIMETH/SULFA <=10 SENSITIVE Sensitive     CLINDAMYCIN <=0.25 SENSITIVE Sensitive     RIFAMPIN <=0.5 SENSITIVE Sensitive     Inducible Clindamycin NEGATIVE Sensitive     * RARE STAPHYLOCOCCUS AUREUS  Aerobic/Anaerobic Culture w Gram Stain (surgical/deep wound)     Status: None   Collection Time: 11/11/20  3:32 PM   Specimen: Other Source; Tissue  Result Value Ref Range Status   Specimen Description   Final    WOUND 2ND METATARSAL Performed at [facility], [address], [place], [place] [address]    Special Requests   Final    NONE Performed at [facility], [place], [place] [address]    Gram Stain   Final    NO ORGANISMS SEEN SQUAMOUS EPITHELIAL CELLS PRESENT FEW WBC PRESENT, PREDOMINANTLY MONONUCLEAR FEW GRAM POSITIVE COCCI Performed at [facility], [place] [address], [place], [place] [address]    Culture   Final    RARE STREPTOCOCCUS MITIS/ORALIS RARE PASTEURELLA MULTOCIDA Usually susceptible to penicillin and other beta lactam agents,quinolones,macrolides and tetracyclines. ABUNDANT BACTEROIDES SPECIES BETA LACTAMASE POSITIVE RARE STAPHYLOCOCCUS AUREUS    Report Status 11/17/2020 FINAL  Final   Organism ID, Bacteria STREPTOCOCCUS MITIS/ORALIS  Final   Organism ID, Bacteria STAPHYLOCOCCUS AUREUS  Final      Susceptibility   Staphylococcus aureus - MIC*    CIPROFLOXACIN <=0.5 SENSITIVE Sensitive  ERYTHROMYCIN <=0.25  SENSITIVE Sensitive     GENTAMICIN <=0.5 SENSITIVE Sensitive     OXACILLIN <=0.25 SENSITIVE Sensitive     TETRACYCLINE <=1 SENSITIVE Sensitive     VANCOMYCIN <=0.5 SENSITIVE Sensitive     TRIMETH/SULFA <=10 SENSITIVE Sensitive     CLINDAMYCIN <=0.25 SENSITIVE Sensitive     RIFAMPIN <=0.5 SENSITIVE Sensitive     Inducible Clindamycin NEGATIVE Sensitive     * RARE STAPHYLOCOCCUS AUREUS   Streptococcus mitis/oralis - MIC*    TETRACYCLINE 0.5 SENSITIVE Sensitive     VANCOMYCIN 0.5 SENSITIVE Sensitive     CLINDAMYCIN <=0.25 SENSITIVE Sensitive     * RARE STREPTOCOCCUS MITIS/ORALIS  Aerobic/Anaerobic Culture w Gram Stain (surgical/deep wound)     Status: None   Collection Time: 11/11/20  4:26 PM   Specimen: Other Source; Tissue  Result Value Ref Range Status   Specimen Description   Final    WOUND LEFT METATARSAL Performed at [facility], [address] [place], [place] [address]    Special Requests   Final    NONE Performed at [facility], [place], [place] [address]    Gram Stain NO WBC SEEN NO ORGANISMS SEEN   Final   Culture   Final    RARE BACTEROIDES SPECIES BETA LACTAMASE POSITIVE RARE PEPTOSTREPTOCOCCUS MICROS Standardized susceptibility testing for this organism is not available. Performed at [facility], [place] [address] [place], [place] [address]    Report Status 11/17/2020 FINAL  Final  MRSA Next Gen by PCR, Nasal     Status: None   Collection Time: 11/13/20 11:22 AM   Specimen: Nasal Mucosa; Nasal Swab  Result Value Ref Range Status   MRSA by PCR Next Gen NOT DETECTED NOT DETECTED Final    Comment: (NOTE) The GeneXpert MRSA Assay (FDA approved for NASAL specimens only), is one component of a comprehensive MRSA colonization surveillance program. It is not intended to diagnose MRSA infection nor to guide or monitor treatment for MRSA infections. Test performance is not FDA approved in patients less than 33  years old. Performed at [facility], [address] [place], [place] [address]   CULTURE, BLOOD (ROUTINE X 2) w Reflex to ID Panel     Status: None   Collection Time: 11/13/20 12:44 PM   Specimen: BLOOD  Result Value Ref Range Status   Specimen Description BLOOD LEFT ANTECUBITAL  Final   Special Requests   Final    BOTTLES DRAWN AEROBIC AND ANAEROBIC Blood Culture adequate volume   Culture   Final    NO GROWTH 5 DAYS Performed at [facility], [address] [place], [place] [address]    Report Status 11/18/2020 FINAL  Final  CULTURE, BLOOD (ROUTINE X 2) w Reflex to ID Panel     Status: None   Collection Time: 11/13/20 12:52 PM   Specimen: BLOOD  Result Value Ref Range Status   Specimen Description BLOOD BLOOD LEFT HAND  Final   Special Requests   Final    BOTTLES DRAWN AEROBIC ONLY Blood Culture results may not be optimal due to an inadequate volume of blood received in culture bottles   Culture   Final    NO GROWTH 5 DAYS Performed at [facility], [address] [place], [place] [address]    Report Status 11/18/2020 FINAL  Final  Culture, Respiratory w Gram Stain     Status: None   Collection Time: 11/13/20  3:43 PM  Specimen: Tracheal Aspirate; Respiratory  Result Value Ref Range Status   Specimen Description   Final    TRACHEAL ASPIRATE Performed at [facility], [address], [place], [place] [address]    Special Requests   Final    NONE Performed at [facility], [place], [place] [address]    Gram Stain   Final    NO SQUAMOUS EPITHELIAL CELLS SEEN FEW WBC SEEN NO ORGANISMS SEEN Performed at [facility], [place] [address], [place], [place] [address]    Culture RARE CANDIDA ALBICANS  Final   Report Status 11/16/2020 FINAL  Final  KOH prep     Status: None   Collection Time: 11/27/20 11:40 AM   Specimen: Esophagus  Result Value Ref Range Status   Specimen Description ESOPHAGUS  Final    Special Requests Normal  Final   KOH Prep   Final    YEAST PRESENT Performed at [facility], [address] [place], [place] [address]    Report Status 11/27/2020 FINAL  Final  Resp Panel by RT-PCR (Flu A&B, Covid) Nasopharyngeal Swab     Status: None   Collection Time: 12/04/20 11:07 AM   Specimen: Nasopharyngeal Swab; Nasopharyngeal(NP) swabs in vial transport medium  Result Value Ref Range Status   SARS Coronavirus 2 by RT PCR NEGATIVE NEGATIVE Final    Comment: (NOTE) SARS-CoV-2 target nucleic acids are NOT DETECTED.  The SARS-CoV-2 RNA is generally detectable in upper respiratory specimens during the acute phase of infection. The lowest concentration of SARS-CoV-2 viral copies this assay can detect is 138 copies/mL. A negative result does not preclude SARS-Cov-2 infection and should not be used as the sole basis for treatment or other patient management decisions. A negative result may occur with  improper specimen collection/handling, submission of specimen other than nasopharyngeal swab, presence of viral mutation(s) within the areas targeted by this assay, and inadequate number of viral copies(<138 copies/mL). A negative result must be combined with clinical observations, patient history, and epidemiological information. The expected result is Negative.  Fact Sheet for Patients:  EntrepreneurPulse.com.au  Fact Sheet for Healthcare Providers:  IncredibleEmployment.be  This test is no t yet approved or cleared by the [place] FDA and  has been authorized for detection and/or diagnosis of SARS-CoV-2 by FDA under an Emergency Use Authorization (EUA). This EUA will remain  in effect (meaning this test can be used) for the duration of the COVID-19 declaration under Section 564(b)(1) of the Act, 21 U.S.C.section 360bbb-3(b)(1), unless the authorization is terminated  or revoked sooner.       Influenza A by PCR NEGATIVE  NEGATIVE Final   Influenza B by PCR NEGATIVE NEGATIVE Final    Comment: (NOTE) The Xpert Xpress SARS-CoV-2/FLU/RSV plus assay is intended as an aid in the diagnosis of influenza from Nasopharyngeal swab specimens and should not be used as a sole basis for treatment. Nasal washings and aspirates are unacceptable for Xpert Xpress SARS-CoV-2/FLU/RSV testing.  Fact Sheet for Patients: EntrepreneurPulse.com.au  Fact Sheet for Healthcare Providers: IncredibleEmployment.be  This test is not yet approved or cleared by the [place] FDA and has been authorized for detection and/or diagnosis of SARS-CoV-2 by FDA under an Emergency Use Authorization (EUA). This EUA will remain in effect (meaning this test can be used) for the duration of the COVID-19 declaration under Section 564(b)(1) of the Act, 21 U.S.C. section 360bbb-3(b)(1), unless the authorization is terminated or revoked.  Performed at [facility],  [place], [place] [address]   Resp Panel by RT-PCR (Flu A&B, Covid) Nasopharyngeal Swab     Status: None   Collection Time: 12/20/20  9:23 AM   Specimen: Nasopharyngeal Swab; Nasopharyngeal(NP) swabs in vial transport medium  Result Value Ref Range Status   SARS Coronavirus 2 by RT PCR NEGATIVE NEGATIVE Final    Comment: (NOTE) SARS-CoV-2 target nucleic acids are NOT DETECTED.  The SARS-CoV-2 RNA is generally detectable in upper respiratory specimens during the acute phase of infection. The lowest concentration of SARS-CoV-2 viral copies this assay can detect is 138 copies/mL. A negative result does not preclude SARS-Cov-2 infection and should not be used as the sole basis for treatment or other patient management decisions. A negative result may occur with  improper specimen collection/handling, submission of specimen other than nasopharyngeal swab, presence of viral mutation(s) within the areas targeted by this  assay, and inadequate number of viral copies(<138 copies/mL). A negative result must be combined with clinical observations, patient history, and epidemiological information. The expected result is Negative.  Fact Sheet for Patients:  EntrepreneurPulse.com.au  Fact Sheet for Healthcare Providers:  IncredibleEmployment.be  This test is no t yet approved or cleared by the [place] FDA and  has been authorized for detection and/or diagnosis of SARS-CoV-2 by FDA under an Emergency Use Authorization (EUA). This EUA will remain  in effect (meaning this test can be used) for the duration of the COVID-19 declaration under Section 564(b)(1) of the Act, 21 U.S.C.section 360bbb-3(b)(1), unless the authorization is terminated  or revoked sooner.       Influenza A by PCR NEGATIVE NEGATIVE Final   Influenza B by PCR NEGATIVE NEGATIVE Final    Comment: (NOTE) The Xpert Xpress SARS-CoV-2/FLU/RSV plus assay is intended as an aid in the diagnosis of influenza from Nasopharyngeal swab specimens and should not be used as a sole basis for treatment. Nasal washings and aspirates are unacceptable for Xpert Xpress SARS-CoV-2/FLU/RSV testing.  Fact Sheet for Patients: EntrepreneurPulse.com.au  Fact Sheet for Healthcare Providers: IncredibleEmployment.be  This test is not yet approved or cleared by the [place] FDA and has been authorized for detection and/or diagnosis of SARS-CoV-2 by FDA under an Emergency Use Authorization (EUA). This EUA will remain in effect (meaning this test can be used) for the duration of the COVID-19 declaration under Section 564(b)(1) of the Act, 21 U.S.C. section 360bbb-3(b)(1), unless the authorization is terminated or revoked.  Performed at [facility], [address] [place], [place] [address]     RADIOLOGY:  Bea Graff 2 Views  Result Date: 12/20/2020 CLINICAL DATA:   Abdominal pain and distension.  Sepsis. EXAM: ABDOMEN - 2 VIEW COMPARISON:  11/17/2020 FINDINGS: Normal bowel gas pattern.  No obstruction or ileus. Surgical clips in the abdomen and pelvis on the left. No urinary tract calcifications. Degenerative changes in the lumbar spine. IMPRESSION: Normal bowel gas pattern. Electronically Signed   By: Franchot Gallo M.D.   On: 12/20/2020 15:01       Management plans discussed with the patient, family and they are in agreement.  CODE STATUS:     Code Status Orders  (From admission, onward)           Start     Ordered   11/09/20 1556  Full code  Continuous        11/09/20 1555           Code Status History     This patient has a  current code status but no historical code status.       TOTAL TIME TAKING CARE OF THIS PATIENT: 35 minutes.    Loletha Grayer M.D on 12/21/2020 at 1:17 PM   [facility]  CC: Primary care physician; Harlow Ohms, MD

## 2020-12-21 NOTE — TOC Transition Note (Addendum)
Transition of Care [facility]) - CM/SW Discharge Note   Patient Details  Name: Autumn [name] MRN: [id number] Date of Birth: 16-Oct-1950  Transition of Care [facility] CM/SW Contact:  Izola Price, RN Phone Number: 12/21/2020, 2:57 PM   Clinical Narrative: Patient being discharged today and transferred to [facility]. Confirmed with AC at [facility]. Going to Room 304-A. Inboxed DC Summary per request. [facility] to transport to facility. Left VM for patient and alternate contact. Original choice had been [facility] but changed to [facility] due to [employer] issues. Simmie Davies RN CM   Update: Unit RN and RN CM unable to contact live person for report/handoff.  Left VM x2 for [facility] that patient was being sent as we had earlier approval, DC summary sent earlier, and room number assigned-- and to call RN CM back asap if that was not okay. RN CM advised Unit RN to go ahead with transport just as RN CM received a call back from [facility] letting us know that was okay to send without nurse to nurse handoff as they had dc summary in hand. Advised unit RN to document her attempt to make a phone call report/handoff and RN CM would document conversation with [facility] that this was okay to send patient to facility. RN CM explained our multiple attempts to reach out and messages left with [facility] for handoff. Simmie Davies RN CM   413 pm. Patient is now in route to facility and [facility] aware. Simmie Davies RN CM    Final next level of care: Skilled Nursing Facility Barriers to Discharge: Barriers Resolved   Patient Goals and CMS Choice        Discharge Placement              Patient chooses bed at: [facility] Patient to be transferred to facility by: [facility] Name of family member notified: Left voicemail on patient mobile and alternate contact to call RN CM back. Patient and family notified of of transfer: 12/21/20  Discharge Plan and Services                DME Arranged: N/A DME Agency: NA       HH Arranged: NA HH  Agency: NA        Social Determinants of Health (SDOH) Interventions     Readmission Risk Interventions No flowsheet data found.

## 2020-12-21 NOTE — Progress Notes (Signed)
Patient left at 1545 going to [facility]. Left a voicemail for her friend [name] to notify her of patient leaving. Also attempted multiple times and numbers to call report to [facility]. Notified the case manager that I was unable to get anyone to answer and she attempted also. Finally got the [facility] on the phone who stated that it was ok to send the patient without nurse to nurse handoff. Patient alert and oriented times 4 with no complaints open leaving. All of her belongings including cell phone were packed and sent with her.

## 2020-12-21 NOTE — Progress Notes (Signed)
SLP Cancellation Note  Patient Details Name: WILENE PHARO MRN: [id number] DOB: 01-30-1950   Cancelled treatment:       Reason Eval/Treat Not Completed: Fatigue/lethargy limiting ability to participate;Medical issues which prohibited therapy (chart reviewed; met w/ pt). Pt c/o N/V and stated she did not feel well; she appeared fatigued. Per MD note, pt has not eaten meals today. When asked about this, pt stated she felt "too sick" to eat and did not want anything. (No bowel movements in several days reported.) ST services will hold on speech therapy exs(dysarthria) until pt feels better. Encouraged pt to use her "bigger" speech w/ effort and to emphasis her words when she can.      Orinda Kenner, MS, CCC-SLP Speech Language Pathologist Rehab Services [phone] [facility] 12/21/2020, 8:05 AM

## 2020-12-26 ENCOUNTER — Ambulatory Visit: Payer: Medicare (Managed Care) | Admitting: Podiatry

## 2020-12-30 ENCOUNTER — Other Ambulatory Visit: Payer: Self-pay

## 2020-12-30 ENCOUNTER — Encounter: Payer: Self-pay | Admitting: Podiatry

## 2020-12-30 ENCOUNTER — Ambulatory Visit (INDEPENDENT_AMBULATORY_CARE_PROVIDER_SITE_OTHER): Payer: Medicare (Managed Care) | Admitting: Podiatry

## 2020-12-30 DIAGNOSIS — L97523 Non-pressure chronic ulcer of other part of left foot with necrosis of muscle: Secondary | ICD-10-CM

## 2021-01-01 ENCOUNTER — Telehealth: Payer: Self-pay | Admitting: *Deleted

## 2021-01-01 NOTE — Telephone Encounter (Signed)
Yes that is fine. There's an order from yesterday if you can write the measurements in from my note

## 2021-01-01 NOTE — Telephone Encounter (Signed)
Autumn [name] w/ [facility]([phone]) is calling to request wound measurements for wound and their wound vac settings only goes up to 120 instead of 125,is this ok? May fax order to [phone])Please advise.

## 2021-01-01 NOTE — Telephone Encounter (Signed)
Can she use the wound vac 120 instead of the 125?

## 2021-01-01 NOTE — Telephone Encounter (Signed)
Yes

## 2021-01-01 NOTE — Progress Notes (Signed)
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 female returns for post-op check.  She is doing well her speech is getting better and feels her strength is returning  Review of Systems: Negative except as noted in the HPI. Denies N/V/F/Ch.   Objective:  There were no vitals filed for this visit. There is no height or weight on file to calculate BMI. Constitutional Well developed. Well nourished.  Vascular Foot warm and well perfused. Capillary refill normal to all digits.  Calf is soft and supple, no posterior calf or knee pain, negative Bevelyn Buckles' sign  Neurologic Dysarthric speech, quite improved in the last exam with her. Oriented to person, place, and time. Epicritic sensation to light touch grossly reduced bilaterally.  Dermatologic Dorsal full-thickness foot ulceration to deep tissue with exposed tendon measuring 11 cm x 2.0 cm x 1.5 cm.  Plantar ulceration measuring 1.0 x 1.0 x 0.2 cm.  Granular wound bed mild fibrosis in the dorsal wound.  No cellulitis purulence or malodor no signs of infection.  Orthopedic: No pain in foot her strength is improving        Assessment:   1. 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  Ulcer left -We discussed the etiology and factors that are a part of the wound healing process.  We also discussed the risk of infection both soft tissue and osteomyelitis from open ulceration.  Discussed the risk of limb loss if this happens or worsens. -Debridement as below. -Dressed with saline wet-to-dry, DSD. -Continue home dressing changes  3 times weekly I would like the nursing facility to restart the wound VAC for the dorsal wound and I wrote an order for this -Continue  off-loading with restricted weight bearing. -Vascular testing: Not indicated she has intact pulses -HgbA1c: 11/09/2020 6.3% -Last antibiotics: Not currently on them I see no indication to start her -Imaging: No need for further imaging at this point  Procedure: Excisional Debridement of Wound Rationale: Removal of non-viable soft tissue from the wound to promote healing.  Anesthesia: none Post-Debridement Wound Measurements: 11.0 cm x 2.0 cm x 1.5 cm  Type of Debridement: Sharp Excisional Tissue Removed: Non-viable soft tissue Depth of Debridement: Muscle and tendon, necrotic EHL and EHB tendons excised Technique: Sharp excisional debridement to bleeding, viable wound base.  Dressing: Dry, sterile, compression dressing. Disposition: Patient tolerated procedure well.    Return in about 3 weeks (around 01/20/2021) for wound care.       Return in about 3 weeks (around 01/20/2021) for wound care.

## 2021-01-01 NOTE — Telephone Encounter (Signed)
Called and spoke with [facility] at [facility] and they will need a new order faxed to their office with the wound measurements and wound vac settings at 120 mmhg,has to be documented in patient's chart. Please advise.

## 2021-01-01 NOTE — Addendum Note (Signed)
Addended bySherryle Lis, Dinita Migliaccio R on: 01/01/2021 04:53 PM   Modules accepted: Orders

## 2021-01-02 NOTE — Telephone Encounter (Signed)
Faxed new order to [facility],01/02/21,received confirmation.

## 2021-01-09 ENCOUNTER — Inpatient Hospital Stay (HOSPITAL_COMMUNITY)
Admission: EM | Admit: 2021-01-09 | Discharge: 2021-01-25 | DRG: 981 | Disposition: A | Discharge status: Skilled Nursing Facility | Payer: Medicare (Managed Care) | Source: Skilled Nursing Facility | Attending: Student in an Organized Health Care Education/Training Program | Admitting: Student in an Organized Health Care Education/Training Program

## 2021-01-09 ENCOUNTER — Inpatient Hospital Stay (HOSPITAL_COMMUNITY): Payer: Medicare (Managed Care)

## 2021-01-09 ENCOUNTER — Other Ambulatory Visit: Payer: Self-pay

## 2021-01-09 ENCOUNTER — Emergency Department (HOSPITAL_COMMUNITY): Payer: Medicare (Managed Care)

## 2021-01-09 DIAGNOSIS — D689 Coagulation defect, unspecified: Secondary | ICD-10-CM | POA: Diagnosis present

## 2021-01-09 DIAGNOSIS — M86672 Other chronic osteomyelitis, left ankle and foot: Secondary | ICD-10-CM | POA: Diagnosis present

## 2021-01-09 DIAGNOSIS — M109 Gout, unspecified: Secondary | ICD-10-CM | POA: Diagnosis present

## 2021-01-09 DIAGNOSIS — M86172 Other acute osteomyelitis, left ankle and foot: Secondary | ICD-10-CM

## 2021-01-09 DIAGNOSIS — D631 Anemia in chronic kidney disease: Secondary | ICD-10-CM | POA: Diagnosis present

## 2021-01-09 DIAGNOSIS — N179 Acute kidney failure, unspecified: Secondary | ICD-10-CM | POA: Diagnosis not present

## 2021-01-09 DIAGNOSIS — E872 Acidosis, unspecified: Secondary | ICD-10-CM | POA: Diagnosis present

## 2021-01-09 DIAGNOSIS — Z86010 Personal history of colonic polyps: Secondary | ICD-10-CM | POA: Diagnosis not present

## 2021-01-09 DIAGNOSIS — Z6831 Body mass index (BMI) 31.0-31.9, adult: Secondary | ICD-10-CM

## 2021-01-09 DIAGNOSIS — D696 Thrombocytopenia, unspecified: Secondary | ICD-10-CM | POA: Diagnosis not present

## 2021-01-09 DIAGNOSIS — Z7901 Long term (current) use of anticoagulants: Secondary | ICD-10-CM

## 2021-01-09 DIAGNOSIS — N189 Chronic kidney disease, unspecified: Secondary | ICD-10-CM | POA: Diagnosis present

## 2021-01-09 DIAGNOSIS — Z79899 Other long term (current) drug therapy: Secondary | ICD-10-CM

## 2021-01-09 DIAGNOSIS — K640 First degree hemorrhoids: Secondary | ICD-10-CM | POA: Diagnosis present

## 2021-01-09 DIAGNOSIS — I252 Old myocardial infarction: Secondary | ICD-10-CM

## 2021-01-09 DIAGNOSIS — N390 Urinary tract infection, site not specified: Secondary | ICD-10-CM | POA: Diagnosis present

## 2021-01-09 DIAGNOSIS — E119 Type 2 diabetes mellitus without complications: Secondary | ICD-10-CM

## 2021-01-09 DIAGNOSIS — F05 Delirium due to known physiological condition: Secondary | ICD-10-CM | POA: Diagnosis not present

## 2021-01-09 DIAGNOSIS — N186 End stage renal disease: Secondary | ICD-10-CM | POA: Diagnosis present

## 2021-01-09 DIAGNOSIS — D638 Anemia in other chronic diseases classified elsewhere: Secondary | ICD-10-CM | POA: Diagnosis not present

## 2021-01-09 DIAGNOSIS — Z6828 Body mass index (BMI) 28.0-28.9, adult: Secondary | ICD-10-CM

## 2021-01-09 DIAGNOSIS — E669 Obesity, unspecified: Secondary | ICD-10-CM | POA: Diagnosis present

## 2021-01-09 DIAGNOSIS — Z87891 Personal history of nicotine dependence: Secondary | ICD-10-CM | POA: Diagnosis not present

## 2021-01-09 DIAGNOSIS — Z885 Allergy status to narcotic agent status: Secondary | ICD-10-CM

## 2021-01-09 DIAGNOSIS — Z992 Dependence on renal dialysis: Secondary | ICD-10-CM

## 2021-01-09 DIAGNOSIS — D62 Acute posthemorrhagic anemia: Secondary | ICD-10-CM | POA: Diagnosis present

## 2021-01-09 DIAGNOSIS — D61818 Other pancytopenia: Secondary | ICD-10-CM | POA: Diagnosis present

## 2021-01-09 DIAGNOSIS — K2970 Gastritis, unspecified, without bleeding: Secondary | ICD-10-CM | POA: Diagnosis present

## 2021-01-09 DIAGNOSIS — E43 Unspecified severe protein-calorie malnutrition: Secondary | ICD-10-CM | POA: Insufficient documentation

## 2021-01-09 DIAGNOSIS — E1122 Type 2 diabetes mellitus with diabetic chronic kidney disease: Secondary | ICD-10-CM | POA: Diagnosis present

## 2021-01-09 DIAGNOSIS — J1289 Other viral pneumonia: Secondary | ICD-10-CM | POA: Diagnosis not present

## 2021-01-09 DIAGNOSIS — I4891 Unspecified atrial fibrillation: Secondary | ICD-10-CM | POA: Diagnosis not present

## 2021-01-09 DIAGNOSIS — N170 Acute kidney failure with tubular necrosis: Secondary | ICD-10-CM | POA: Diagnosis present

## 2021-01-09 DIAGNOSIS — Z90710 Acquired absence of both cervix and uterus: Secondary | ICD-10-CM

## 2021-01-09 DIAGNOSIS — E8809 Other disorders of plasma-protein metabolism, not elsewhere classified: Secondary | ICD-10-CM | POA: Diagnosis present

## 2021-01-09 DIAGNOSIS — R34 Anuria and oliguria: Secondary | ICD-10-CM | POA: Diagnosis present

## 2021-01-09 DIAGNOSIS — E039 Hypothyroidism, unspecified: Secondary | ICD-10-CM | POA: Diagnosis present

## 2021-01-09 DIAGNOSIS — U071 COVID-19: Secondary | ICD-10-CM | POA: Diagnosis present

## 2021-01-09 DIAGNOSIS — K648 Other hemorrhoids: Secondary | ICD-10-CM | POA: Diagnosis not present

## 2021-01-09 DIAGNOSIS — D6959 Other secondary thrombocytopenia: Secondary | ICD-10-CM | POA: Diagnosis present

## 2021-01-09 DIAGNOSIS — K7581 Nonalcoholic steatohepatitis (NASH): Secondary | ICD-10-CM | POA: Diagnosis present

## 2021-01-09 DIAGNOSIS — K7460 Unspecified cirrhosis of liver: Secondary | ICD-10-CM | POA: Diagnosis present

## 2021-01-09 DIAGNOSIS — J9601 Acute respiratory failure with hypoxia: Secondary | ICD-10-CM | POA: Diagnosis present

## 2021-01-09 DIAGNOSIS — I509 Heart failure, unspecified: Secondary | ICD-10-CM

## 2021-01-09 DIAGNOSIS — K922 Gastrointestinal hemorrhage, unspecified: Secondary | ICD-10-CM | POA: Diagnosis not present

## 2021-01-09 DIAGNOSIS — G4733 Obstructive sleep apnea (adult) (pediatric): Secondary | ICD-10-CM | POA: Diagnosis present

## 2021-01-09 DIAGNOSIS — K769 Liver disease, unspecified: Secondary | ICD-10-CM

## 2021-01-09 DIAGNOSIS — G9349 Other encephalopathy: Secondary | ICD-10-CM | POA: Diagnosis present

## 2021-01-09 DIAGNOSIS — F32A Depression, unspecified: Secondary | ICD-10-CM | POA: Diagnosis present

## 2021-01-09 DIAGNOSIS — R0902 Hypoxemia: Secondary | ICD-10-CM

## 2021-01-09 DIAGNOSIS — R0600 Dyspnea, unspecified: Secondary | ICD-10-CM | POA: Diagnosis present

## 2021-01-09 DIAGNOSIS — I132 Hypertensive heart and chronic kidney disease with heart failure and with stage 5 chronic kidney disease, or end stage renal disease: Secondary | ICD-10-CM | POA: Diagnosis present

## 2021-01-09 DIAGNOSIS — I480 Paroxysmal atrial fibrillation: Secondary | ICD-10-CM | POA: Diagnosis present

## 2021-01-09 DIAGNOSIS — Z8249 Family history of ischemic heart disease and other diseases of the circulatory system: Secondary | ICD-10-CM

## 2021-01-09 DIAGNOSIS — I5032 Chronic diastolic (congestive) heart failure: Secondary | ICD-10-CM | POA: Diagnosis present

## 2021-01-09 DIAGNOSIS — Z8673 Personal history of transient ischemic attack (TIA), and cerebral infarction without residual deficits: Secondary | ICD-10-CM

## 2021-01-09 DIAGNOSIS — J1282 Pneumonia due to coronavirus disease 2019: Secondary | ICD-10-CM | POA: Diagnosis present

## 2021-01-09 DIAGNOSIS — N049 Nephrotic syndrome with unspecified morphologic changes: Secondary | ICD-10-CM | POA: Diagnosis present

## 2021-01-09 DIAGNOSIS — N184 Chronic kidney disease, stage 4 (severe): Secondary | ICD-10-CM | POA: Diagnosis not present

## 2021-01-09 DIAGNOSIS — R0603 Acute respiratory distress: Secondary | ICD-10-CM

## 2021-01-09 DIAGNOSIS — N250 Renal osteodystrophy: Secondary | ICD-10-CM | POA: Diagnosis present

## 2021-01-09 DIAGNOSIS — K5900 Constipation, unspecified: Secondary | ICD-10-CM | POA: Diagnosis present

## 2021-01-09 DIAGNOSIS — Z87442 Personal history of urinary calculi: Secondary | ICD-10-CM

## 2021-01-09 DIAGNOSIS — K625 Hemorrhage of anus and rectum: Secondary | ICD-10-CM | POA: Diagnosis not present

## 2021-01-09 DIAGNOSIS — E1165 Type 2 diabetes mellitus with hyperglycemia: Secondary | ICD-10-CM | POA: Diagnosis present

## 2021-01-09 DIAGNOSIS — Z79890 Hormone replacement therapy (postmenopausal): Secondary | ICD-10-CM

## 2021-01-09 DIAGNOSIS — D649 Anemia, unspecified: Secondary | ICD-10-CM | POA: Diagnosis not present

## 2021-01-09 DIAGNOSIS — I129 Hypertensive chronic kidney disease with stage 1 through stage 4 chronic kidney disease, or unspecified chronic kidney disease: Secondary | ICD-10-CM | POA: Diagnosis not present

## 2021-01-09 LAB — PROTIME-INR
INR: 3.4 — ABNORMAL HIGH (ref 0.8–1.2)
Prothrombin Time: 34.4 seconds — ABNORMAL HIGH (ref 11.4–15.2)

## 2021-01-09 LAB — CBC
HCT: 24.2 % — ABNORMAL LOW (ref 36.0–46.0)
Hemoglobin: 7.4 g/dL — ABNORMAL LOW (ref 12.0–15.0)
MCH: 29.2 pg (ref 26.0–34.0)
MCHC: 30.6 g/dL (ref 30.0–36.0)
MCV: 95.7 fL (ref 80.0–100.0)
Platelets: 159 10*3/uL (ref 150–400)
RBC: 2.53 MIL/uL — ABNORMAL LOW (ref 3.87–5.11)
RDW: 18.7 % — ABNORMAL HIGH (ref 11.5–15.5)
WBC: 5 10*3/uL (ref 4.0–10.5)
nRBC: 0 % (ref 0.0–0.2)

## 2021-01-09 LAB — HEPATIC FUNCTION PANEL
ALT: 9 U/L (ref 0–44)
AST: 17 U/L (ref 15–41)
Albumin: 2.4 g/dL — ABNORMAL LOW (ref 3.5–5.0)
Alkaline Phosphatase: 66 U/L (ref 38–126)
Bilirubin, Direct: <0.1 mg/dL (ref 0.0–0.2)
Total Bilirubin: 0.7 mg/dL (ref 0.3–1.2)
Total Protein: 5.2 g/dL — ABNORMAL LOW (ref 6.5–8.1)

## 2021-01-09 LAB — TROPONIN I (HIGH SENSITIVITY)
Troponin I (High Sensitivity): 17 ng/L (ref <18)
Troponin I (High Sensitivity): 17 ng/L (ref <18)

## 2021-01-09 LAB — FIBRINOGEN: Fibrinogen: 395 mg/dL (ref 210–475)

## 2021-01-09 LAB — URINALYSIS, COMPLETE (UACMP) WITH MICROSCOPIC
Bilirubin Urine: NEGATIVE
Glucose, UA: NEGATIVE mg/dL
Hgb urine dipstick: NEGATIVE
Ketones, ur: NEGATIVE mg/dL
Nitrite: NEGATIVE
Protein, ur: 30 mg/dL — AB
Specific Gravity, Urine: 1.01 (ref 1.005–1.030)
pH: 5 (ref 5.0–8.0)

## 2021-01-09 LAB — BASIC METABOLIC PANEL
Anion gap: 16 — ABNORMAL HIGH (ref 5–15)
BUN: 83 mg/dL — ABNORMAL HIGH (ref 8–23)
CO2: 14 mmol/L — ABNORMAL LOW (ref 22–32)
Calcium: 8.4 mg/dL — ABNORMAL LOW (ref 8.9–10.3)
Chloride: 106 mmol/L (ref 98–111)
Creatinine, Ser: 6.31 mg/dL — ABNORMAL HIGH (ref 0.44–1.00)
GFR, Estimated: 7 mL/min — ABNORMAL LOW (ref >60)
Glucose, Bld: 129 mg/dL — ABNORMAL HIGH (ref 70–99)
Potassium: 3.9 mmol/L (ref 3.5–5.1)
Sodium: 136 mmol/L (ref 135–145)

## 2021-01-09 LAB — GLUCOSE, CAPILLARY: Glucose-Capillary: 120 mg/dL — ABNORMAL HIGH (ref 70–99)

## 2021-01-09 LAB — C-REACTIVE PROTEIN: CRP: 3.5 mg/dL — ABNORMAL HIGH (ref <1.0)

## 2021-01-09 LAB — RESP PANEL BY RT-PCR (FLU A&B, COVID) ARPGX2
Influenza A by PCR: NEGATIVE
Influenza B by PCR: NEGATIVE
SARS Coronavirus 2 by RT PCR: POSITIVE — AB

## 2021-01-09 LAB — I-STAT ARTERIAL BLOOD GAS, ED
Acid-base deficit: 9 mmol/L — ABNORMAL HIGH (ref 0.0–2.0)
Bicarbonate: 16.8 mmol/L — ABNORMAL LOW (ref 20.0–28.0)
Calcium, Ion: 1.2 mmol/L (ref 1.15–1.40)
HCT: 21 % — ABNORMAL LOW (ref 36.0–46.0)
Hemoglobin: 7.1 g/dL — ABNORMAL LOW (ref 12.0–15.0)
O2 Saturation: 89 %
Potassium: 3.8 mmol/L (ref 3.5–5.1)
Sodium: 136 mmol/L (ref 135–145)
TCO2: 18 mmol/L — ABNORMAL LOW (ref 22–32)
pCO2 arterial: 33 mmHg (ref 32.0–48.0)
pH, Arterial: 7.316 — ABNORMAL LOW (ref 7.350–7.450)
pO2, Arterial: 61 mmHg — ABNORMAL LOW (ref 83.0–108.0)

## 2021-01-09 LAB — FERRITIN: Ferritin: 41 ng/mL (ref 11–307)

## 2021-01-09 LAB — TRIGLYCERIDES: Triglycerides: 73 mg/dL (ref <150)

## 2021-01-09 LAB — D-DIMER, QUANTITATIVE: D-Dimer, Quant: 2.14 ug/mL-FEU — ABNORMAL HIGH (ref 0.00–0.50)

## 2021-01-09 LAB — BRAIN NATRIURETIC PEPTIDE: B Natriuretic Peptide: 660.5 pg/mL — ABNORMAL HIGH (ref 0.0–100.0)

## 2021-01-09 LAB — HEPARIN LEVEL (UNFRACTIONATED): Heparin Unfractionated: >1.1 IU/mL — ABNORMAL HIGH (ref 0.30–0.70)

## 2021-01-09 LAB — LACTATE DEHYDROGENASE: LDH: 168 U/L (ref 98–192)

## 2021-01-09 LAB — LACTIC ACID, PLASMA
Lactic Acid, Venous: 1.1 mmol/L (ref 0.5–1.9)
Lactic Acid, Venous: 1 mmol/L (ref 0.5–1.9)

## 2021-01-09 LAB — PROCALCITONIN: Procalcitonin: 125.67 ng/mL

## 2021-01-09 LAB — APTT: aPTT: >200 seconds (ref 24–36)

## 2021-01-09 LAB — MAGNESIUM: Magnesium: 2.1 mg/dL (ref 1.7–2.4)

## 2021-01-09 MED ORDER — LACTATED RINGERS IV SOLN: INTRAVENOUS | Status: DC

## 2021-01-09 MED ORDER — DEXAMETHASONE 6 MG PO TABS
6.0000 mg | ORAL_TABLET | Freq: Every day | ORAL | Status: DC
  Administered 2021-01-10 – 2021-01-14 (×5): 6 mg via ORAL
  Filled 2021-01-09 (×5): qty 1

## 2021-01-09 MED ORDER — SENNOSIDES-DOCUSATE SODIUM 8.6-50 MG PO TABS: 1.0000 | ORAL_TABLET | Freq: Every evening | ORAL | Status: DC | PRN

## 2021-01-09 MED ORDER — HEPARIN SODIUM (PORCINE) 5000 UNIT/ML IJ SOLN: 5000.0000 [IU] | Freq: Three times a day (TID) | INTRAMUSCULAR | Status: DC

## 2021-01-09 MED ORDER — ALBUMIN HUMAN 25 % IV SOLN
25.0000 g | Freq: Once | INTRAVENOUS | Status: AC
  Administered 2021-01-10: 02:00:00 25 g via INTRAVENOUS
  Filled 2021-01-09: qty 100

## 2021-01-09 MED ORDER — HEPARIN (PORCINE) 25000 UT/250ML-% IV SOLN
1000.0000 [IU]/h | INTRAVENOUS | Status: DC
  Administered 2021-01-09: 22:00:00 1400 [IU]/h via INTRAVENOUS
  Filled 2021-01-09: qty 250

## 2021-01-09 MED ORDER — LEVOTHYROXINE SODIUM 100 MCG PO TABS
100.0000 ug | ORAL_TABLET | Freq: Every day | ORAL | Status: DC
Start: 2021-01-10 — End: 2021-01-25
  Administered 2021-01-11 – 2021-01-25 (×15): 100 ug via ORAL
  Filled 2021-01-09 (×15): qty 1

## 2021-01-09 MED ORDER — ACETAMINOPHEN 325 MG PO TABS
650.0000 mg | ORAL_TABLET | Freq: Four times a day (QID) | ORAL | Status: DC | PRN
  Administered 2021-01-13: 650 mg via ORAL
  Filled 2021-01-09: qty 2

## 2021-01-09 MED ORDER — BUMETANIDE 0.25 MG/ML IJ SOLN
0.5000 mg | Freq: Once | INTRAMUSCULAR | Status: AC
  Administered 2021-01-09: 15:00:00 0.5 mg via INTRAVENOUS
  Filled 2021-01-09: qty 2

## 2021-01-09 MED ORDER — PANTOPRAZOLE SODIUM 40 MG PO TBEC
40.0000 mg | DELAYED_RELEASE_TABLET | Freq: Two times a day (BID) | ORAL | Status: DC
Start: 2021-01-09 — End: 2021-01-14
  Administered 2021-01-10 – 2021-01-14 (×9): 40 mg via ORAL
  Filled 2021-01-09 (×10): qty 1

## 2021-01-09 MED ORDER — VENLAFAXINE HCL 50 MG PO TABS: 50.0000 mg | ORAL_TABLET | Freq: Two times a day (BID) | ORAL | Status: DC

## 2021-01-09 MED ORDER — ALBUTEROL SULFATE (2.5 MG/3ML) 0.083% IN NEBU: 3.0000 mL | INHALATION_SOLUTION | RESPIRATORY_TRACT | Status: DC | PRN

## 2021-01-09 MED ORDER — FUROSEMIDE 10 MG/ML IJ SOLN
80.0000 mg | Freq: Once | INTRAMUSCULAR | Status: AC
Start: 2021-01-10 — End: 2021-01-10
  Administered 2021-01-10: 02:00:00 80 mg via INTRAVENOUS
  Filled 2021-01-09: qty 8

## 2021-01-09 MED ORDER — FUROSEMIDE 10 MG/ML IJ SOLN
80.0000 mg | Freq: Once | INTRAMUSCULAR | Status: DC
  Filled 2021-01-09: qty 8

## 2021-01-09 MED ORDER — DEXAMETHASONE SODIUM PHOSPHATE 10 MG/ML IJ SOLN
6.0000 mg | Freq: Once | INTRAMUSCULAR | Status: AC
  Administered 2021-01-09: 14:00:00 6 mg via INTRAVENOUS
  Filled 2021-01-09: qty 1

## 2021-01-09 MED ORDER — ALBUMIN HUMAN 25 % IV SOLN: 25.0000 g | Freq: Once | INTRAVENOUS | Status: DC

## 2021-01-09 MED ORDER — ATORVASTATIN CALCIUM 40 MG PO TABS
40.0000 mg | ORAL_TABLET | Freq: Every day | ORAL | Status: DC
  Administered 2021-01-10 – 2021-01-24 (×15): 40 mg via ORAL
  Filled 2021-01-09 (×15): qty 1

## 2021-01-09 MED ORDER — SODIUM BICARBONATE 650 MG PO TABS
650.0000 mg | ORAL_TABLET | Freq: Three times a day (TID) | ORAL | Status: DC
  Administered 2021-01-10: 09:00:00 650 mg via ORAL
  Filled 2021-01-09 (×2): qty 1

## 2021-01-09 NOTE — ED Triage Notes (Addendum)
Pt from [facility] for eval of shob x 2 days with productive cough. Had CXR yesterday which shows bilateral pneumonia and covid test today was positive. Pt has productive cough, SpO2 88% room air, so was placed on O2 by staff at the facility over the last couple of days. Has also been receiving breathing tx at the facility. Also has wound to L foot, for which she has a wound vac at the facility. EMS attempted to bring wound vac with the patient, but staff states the wound vac cannot leave the facility.

## 2021-01-09 NOTE — Progress Notes (Signed)
Pt transported to 3E23 on NRB, then placed back on auto CPAP (5-18) with 5L O2 bled in. Pt tolerating well.

## 2021-01-09 NOTE — ED Notes (Signed)
Attempted report 

## 2021-01-09 NOTE — ED Provider Notes (Signed)
[facility] DEPARTMENT Provider Note   CSN: [id number] Arrival date & time: 01/09/21  1219     History Chief Complaint  Patient presents with   Pneumonia    Autumn [name] is a 70 y.o. female with past medical history of A. fib, osteomyelitis of the left foot, CKD 4, liver disease secondary to [place], nephrotic syndrome, chronic heart failure, who presents today for evaluation of shortness of breath and cough.  She tested positive for COVID-19.  She states her symptoms started 3 days ago.  She does not normally wear any oxygen, she is on 4 L currently.  She reports productive cough and that she has been able to hear herself breathing worsening for the past 2 to 3 days. She is vaccinated against covid.   HPI     Past Medical History:  Diagnosis Date   Diabetes mellitus without complication ([place])    Hypertension    Kidney stone    Thyroid disease     Patient Active Problem List   Diagnosis Date Noted   Nausea    Constipation    AF (paroxysmal atrial fibrillation) (HCC)    Aspergillus pneumonia (HCC)    Acute hypoxemic respiratory failure (HCC)    Left wrist pain    Acute stroke due to ischemia (HCC)    Neutropenia ([place]) 12/08/2020   Drug-induced neutropenia (HCC)    Normocytic anemia    Thrombocytopenia (HCC)    Bilateral carotid artery stenosis    Atrial fibrillation with rapid ventricular response (HCC)    Cellulitis and abscess of foot, except toes    Infectious tenosynovitis    Wheeze    AKI (acute kidney injury) ([place])    Anemia of chronic disease    Hypothyroidism    Atrial fibrillation with RVR ([place]) 11/09/2020   Osteomyelitis of foot, left, acute ([place]) 11/09/2020   CKD (chronic kidney disease), stage IV ([place]) 11/09/2020   Liver cirrhosis secondary to NASH (nonalcoholic steatohepatitis) ([place]) 11/09/2020   Hypertension    History of anemia due to CKD    Severe sepsis with septic shock (HCC)    Frequent falls    Diabetic ulcer  of left midfoot associated with type 2 diabetes mellitus ([place]) 06/29/2020   Volume overload 05/20/2020   Moderate episode of recurrent major depressive disorder ([place]) 03/08/2020   Chronic right-sided heart failure ([place]) 08/22/2019   Orthostatic hypotension 11/07/2018   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 11/10/2017   Nephrotic syndrome 05/24/2017   Iron deficiency anemia due to chronic blood loss 08/30/2015   Risk for falls 04/19/2015   Edema of left lower extremity 02/15/2015   Vertigo 02/15/2015   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 06/01/2012   Chronic nonalcoholic liver disease 30/86/5784   GERD (gastroesophageal reflux disease) 06/01/2012   Calculus of kidney and ureter 07/29/2011    Past Surgical History:  Procedure Laterality Date   ABDOMINAL HYSTERECTOMY     AMPUTATION Left 11/11/2020   Procedure: AMPUTATION RAY - 2nd metatarsal and 3rd metatarsal head;  Surgeon: [name], DPM;  Location: [facility] OR[facility];  Service: Podiatry;  Laterality: Left;   CAROTID ANGIOGRAPHY Left 11/22/2020   Procedure: CAROTID ANGIOGRAPHY;  Surgeon: Katha Cabal, MD;  Location: [facility];  Service: Cardiovascular;  Laterality: Left;   COLONOSCOPY WITH PROPOFOL N/A 11/29/2020   Procedure: COLONOSCOPY WITH PROPOFOL;  Surgeon: Lesly Rubenstein, MD;  Location: [facility];  Service:  Endoscopy;  Laterality: N/A;   ESOPHAGOGASTRODUODENOSCOPY (EGD) WITH PROPOFOL N/A 11/27/2020   Procedure: ESOPHAGOGASTRODUODENOSCOPY (EGD) WITH PROPOFOL;  Surgeon: Lesly Rubenstein, MD;  Location: [facility] END[facility]COPY;  Service: Endoscopy;  Laterality: N/A;   URETERAL STENT PLACEMENT       OB History   No obstetric history on file.     Family History  Problem Relation Age of Onset   Hypertension Mother     Social History   Tobacco Use   Smoking status: Former   Smokeless tobacco: Never  Substance Use Topics   Alcohol use:  Never   Drug use: Never    Home Medications Prior to Admission medications   Medication Sig Start Date End Date Taking? Authorizing Provider  acetaminophen (TYLENOL) 325 MG tablet Take 325 mg by mouth every 6 (six) hours as needed.    [provider]  albuterol (VENTOLIN HFA) 108 (90 Base) MCG/ACT inhaler Inhale 2 puffs into the lungs 4 (four) times daily as needed. 03/25/18   [provider]  allopurinol (ZYLOPRIM) 100 MG tablet Take 0.5 tablets (50 mg total) by mouth every other day. 12/22/20   Loletha Grayer, MD  ALPRAZolam Duanne Moron) 0.25 MG tablet Take 1 tablet (0.25 mg total) by mouth 2 (two) times daily as needed for anxiety. 12/20/20   Loletha Grayer, MD  apixaban (ELIQUIS) 5 MG TABS tablet Take 1 tablet (5 mg total) by mouth 2 (two) times daily. 12/20/20   Loletha Grayer, MD  ascorbic acid (VITAMIN C) 500 MG tablet Take 1 tablet (500 mg total) by mouth 2 (two) times daily. 12/20/20   Loletha Grayer, MD  atorvastatin (LIPITOR) 40 MG tablet Take 1 tablet (40 mg total) by mouth daily. 12/21/20   Loletha Grayer, MD  dicyclomine (BENTYL) 10 MG capsule Take 1 capsule (10 mg total) by mouth 4 (four) times daily -  before meals and at bedtime. 12/21/20   Loletha Grayer, MD  diltiazem (CARDIZEM CD) 180 MG 24 hr capsule Take 1 capsule (180 mg total) by mouth daily. 12/21/20   Loletha Grayer, MD  doxycycline (VIBRA-TABS) 100 MG tablet Take 100 mg by mouth 2 (two) times daily. 12/27/20   [provider]  ferrous gluconate (FERGON) 324 MG tablet Take 1 tablet (324 mg total) by mouth daily with breakfast. 12/21/20   Loletha Grayer, MD  hydrALAZINE (APRESOLINE) 100 MG tablet Take 1 tablet (100 mg total) by mouth every 8 (eight) hours. 12/20/20   Loletha Grayer, MD  levothyroxine (SYNTHROID) 100 MCG tablet Take 100 mcg by mouth every morning. 10/04/20   [provider]  liraglutide (VICTOZA) 18 MG/3ML SOPN Inject into the skin. 10/07/18 07/03/21  [provider]  meclizine (ANTIVERT) 12.5 MG tablet Take 12.5 mg by mouth 3 (three) times daily as needed. 08/30/20   [provider]  metoprolol tartrate (LOPRESSOR) 50 MG tablet Take 1 tablet (50 mg total) by mouth 2 (two) times daily. 12/20/20   Loletha Grayer, MD  metroNIDAZOLE (FLAGYL) 500 MG tablet  12/21/20   [provider]  Multiple Vitamin (MULTIVITAMIN WITH MINERALS) TABS tablet Take 1 tablet by mouth daily. 12/20/20   Loletha Grayer, MD  Nutritional Supplements (,FEEDING SUPPLEMENT, PROSOURCE PLUS) liquid Take 30 mLs by mouth 3 (three) times daily between meals. 12/20/20   Loletha Grayer, MD  ondansetron (ZOFRAN) 4 MG tablet Place 1 tablet (4 mg total) into feeding tube every 6 (six) hours as needed for nausea or vomiting. 12/20/20   Loletha Grayer, MD  pantoprazole (PROTONIX) 40  MG tablet Take 1 tablet (40 mg total) by mouth 2 (two) times daily. 12/20/20   Loletha Grayer, MD  polyethylene glycol (MIRALAX / GLYCOLAX) 17 g packet Take 17 g by mouth daily as needed for severe constipation. 12/21/20   Loletha Grayer, MD  torsemide (DEMADEX) 20 MG tablet Take 1 tablet (20 mg total) by mouth daily. 12/21/20   Loletha Grayer, MD  venlafaxine (EFFEXOR) 50 MG tablet Take 1 tablet (50 mg total) by mouth 2 (two) times daily. 12/20/20   Loletha Grayer, MD    Allergies    Codeine  Review of Systems   Review of Systems  Constitutional:  Positive for chills and fatigue. Negative for fever.  HENT:  Negative for congestion.   Eyes:  Negative for visual disturbance.  Respiratory:  Positive for cough, chest tightness, shortness of breath and wheezing.   Gastrointestinal:  Negative for abdominal pain, diarrhea, nausea and rectal pain.  Genitourinary:  Negative for dysuria.  Musculoskeletal:  Positive for arthralgias and myalgias.  Skin:  Positive for wound. Negative for color change and rash.  Neurological:  Negative for headaches.  Psychiatric/Behavioral:  Negative for  confusion.   All other systems reviewed and are negative.  Physical Exam Updated Vital Signs BP 134/81    Pulse 72    Temp (!) 97.5 F (36.4 C) (Oral)    Resp 16    SpO2 99%   Physical Exam Vitals and nursing note reviewed.  Constitutional:      Appearance: She is ill-appearing (Acute on chronically ill-appearing).  HENT:     Head: Normocephalic and atraumatic.     Mouth/Throat:     Mouth: Mucous membranes are moist.  Eyes:     Conjunctiva/sclera: Conjunctivae normal.  Cardiovascular:     Rate and Rhythm: Normal rate. Rhythm irregular.  Pulmonary:     Effort: Respiratory distress present.     Comments: Patient has audible rails from the doorway.  She has increased work of breathing with increased rate and effort.  Frequent wet sounding cough.  Has 2-4 word dyspnea Abdominal:     General: There is no distension.  Musculoskeletal:     Cervical back: Normal range of motion and neck supple.     Comments: No obvious acute injury, wound VAC dressing in place on left foot.  Skin:    General: Skin is warm.  Neurological:     Mental Status: She is alert.     Comments: Awake and alert, answers all questions appropriately.  Speech is not slurred.  Psychiatric:        Mood and Affect: Mood normal.        Behavior: Behavior normal.    ED Results / Procedures / Treatments   Labs (all labs ordered are listed, but only abnormal results are displayed) Labs Reviewed  CBC - Abnormal; Notable for the following components:      Result Value   RBC 2.53 (*)    Hemoglobin 7.4 (*)    HCT 24.2 (*)    RDW 18.7 (*)    All other components within normal limits  BASIC METABOLIC PANEL - Abnormal; Notable for the following components:   CO2 14 (*)    Glucose, Bld 129 (*)    BUN 83 (*)    Creatinine, Ser 6.31 (*)    Calcium 8.4 (*)    GFR, Estimated 7 (*)    Anion gap 16 (*)    All other components within normal limits  I-STAT ARTERIAL BLOOD GAS,  ED - Abnormal; Notable for the following  components:   pH, Arterial 7.316 (*)    pO2, Arterial 61 (*)    Bicarbonate 16.8 (*)    TCO2 18 (*)    Acid-base deficit 9.0 (*)    HCT 21.0 (*)    Hemoglobin 7.1 (*)    All other components within normal limits  RESP PANEL BY RT-PCR (FLU A&B, COVID) ARPGX2  CULTURE, BLOOD (ROUTINE X 2)  CULTURE, BLOOD (ROUTINE X 2)  MAGNESIUM  LACTIC ACID, PLASMA  LACTIC ACID, PLASMA  D-DIMER, QUANTITATIVE  PROCALCITONIN  LACTATE DEHYDROGENASE  FERRITIN  TRIGLYCERIDES  FIBRINOGEN  C-REACTIVE PROTEIN  BRAIN NATRIURETIC PEPTIDE  HEPATIC FUNCTION PANEL  URINALYSIS, ROUTINE W REFLEX MICROSCOPIC  TYPE AND SCREEN  TROPONIN I (HIGH SENSITIVITY)    EKG EKG Interpretation  Date/Time:  Thursday January 09 2021 12:24:45 EST Ventricular Rate:  73 PR Interval:    QRS Duration: 99 QT Interval:  540 QTC Calculation: 596 R Axis:   7 Text Interpretation: Atrial fibrillation Low voltage, precordial leads Prolonged QT interval Confirmed by Campbell Stall ([id number]) on 19/41/7408 12:44:37 PM  Radiology DG Chest Portable 1 View  Result Date: 01/09/2021 CLINICAL DATA:  Shortness of breath. EXAM: PORTABLE CHEST 1 VIEW COMPARISON:  November 21, 2020. FINDINGS: Stable cardiomegaly. Mild central pulmonary vascular congestion is again noted. Right upper and lower lobe opacities are noted concerning for edema or possibly pneumonia. Bony thorax is unremarkable. IMPRESSION: Stable cardiomegaly with mild central pulmonary vascular congestion. Right upper and lower lobe opacities are noted concerning for edema or possibly pneumonia. Electronically Signed   By: Marijo Conception M.D.   On: 01/09/2021 13:21    Procedures .Critical Care Performed by: Lorin Glass, PA-C Authorized by: Lorin Glass, PA-C   Critical care provider statement:    Critical care time (minutes):  30   Critical care time was exclusive of:  Separately billable procedures and treating other patients and teaching time   Critical care  was necessary to treat or prevent imminent or life-threatening deterioration of the following conditions:  Respiratory failure and renal failure   Critical care was time spent personally by me on the following activities:  Development of treatment plan with patient or surrogate, discussions with consultants, evaluation of patient's response to treatment, examination of patient, ordering and review of laboratory studies, ordering and review of radiographic studies, ordering and performing treatments and interventions, pulse oximetry, re-evaluation of patient's condition and review of old charts   Medications Ordered in ED Medications  lactated ringers infusion ( Intravenous New Bag/Given 01/09/21 1352)  bumetanide (BUMEX) injection 0.5 mg (has no administration in time range)  dexamethasone (DECADRON) injection 6 mg (6 mg Intravenous Given 01/09/21 1348)    ED Course  I have reviewed the triage vital signs and the nursing notes.  Pertinent labs & imaging results that were available during my care of the patient were reviewed by me and considered in my medical decision making (see chart for details).  Clinical Course as of 01/09/21 1528  Thu Jan 09, 2021  1424 Spoke with RT about need for bipap with fluid over load picture.   [EH]  1519 I spoke with internal medicine teaching service who will see patient for admission.  [EH]    Clinical Course User Index [EH] Ollen Gross   MDM Rules/Calculators/A&P  ADRIYANNA CHRISTIANS was evaluated in Emergency Department on 01/09/2021 for the symptoms described in the history of present illness. She was evaluated in the context of the global COVID-19 pandemic, which necessitated consideration that the patient might be at risk for infection with the SARS-CoV-2 virus that causes COVID-19. Institutional protocols and algorithms that pertain to the evaluation of patients at risk for COVID-19 are in a state of rapid change  based on information released by regulatory bodies including the CDC and federal and state organizations. These policies and algorithms were followed during the patient's care in the ED.  Patient is a 70 year old woman who presents today for evaluation of 3 days of feeling sick with new hypoxia.  She is COVID-positive.  She is vaccinated. On exam she appears ill, she has rales audible from the doorway. Chest x-ray shows central pulmonary vascular congestion. She was placed on BiPAP for fluid overload and increased work of breathing.  ABG was obtained.  Labs are obtained, her creatinine has bumped significantly as it was 3.9 to 2 weeks ago, is now 6.31.  Your COVID test is positive.  Albumin is low baseline. [place] preadmission orders are placed. She is given Bumex and very gentle hydration as she appears to be third spacing and requires both diuretic for respiratory status, in the setting of AKI requiring hydration.    I spoke with the IMTS service who will see patient for admission.   The patient appears reasonably stabilized for admission considering the current resources, flow, and capabilities available in the ED at this time, and I doubt any other [facility] requiring further screening and/or treatment in the ED prior to admission assuming timely admission and bed placement.  Note: Portions of this report may have been transcribed using voice recognition software. Every effort was made to ensure accuracy; however, inadvertent computerized transcription errors may be present   Final Clinical Impression(s) / ED Diagnoses Final diagnoses:  COVID-19  Hypoxia  AKI (acute kidney injury) ([place])  Acute on chronic congestive heart failure, unspecified heart failure type ([place])  Osteomyelitis of foot, left, acute ([place])  Chronic nonalcoholic liver disease    Rx / DC Orders ED Discharge Orders     None        [place], [place], PA-C 93/81/82 9937    Lianne Cure, DO 16/96/78 2314

## 2021-01-09 NOTE — Progress Notes (Signed)
ANTICOAGULATION CONSULT NOTE - Initial Consult  Pharmacy Consult for Heparin Indication: atrial fibrillation  Allergies  Allergen Reactions   Codeine Nausea And Vomiting    Patient Measurements:   Heparin Dosing Weight: 96.4 kg  Vital Signs: Temp: 97.5 F (36.4 C) (12/29 1225) Temp Source: Oral (12/29 1225) BP: 128/77 (12/29 1700) Pulse Rate: 75 (12/29 1700)  Labs: Recent Labs    01/09/21 1242 01/09/21 1326 01/09/21 1336 01/09/21 1523  HGB 7.4*  --  7.1*  --   HCT 24.2*  --  21.0*  --   PLT 159  --   --   --   CREATININE 6.31*  --   --   --   TROPONINIHS  --  17  --  17    CrCl cannot be calculated (Unknown ideal weight.).   Medical History: Past Medical History:  Diagnosis Date   Diabetes mellitus without complication ([place])    Hypertension    Kidney stone    Thyroid disease     Medications:  (Not in a hospital admission)  Scheduled:   [START ON 01/10/2021] dexamethasone  6 mg Oral Daily   heparin  5,000 Units Subcutaneous Q8H   Infusions:  PRN: acetaminophen, senna-docusate  Assessment: 8 yof with a history of AF, osteomyelitis of lt foot, CKD, liver disease 2/2 NASH, nephrotic syndrome, HF. Patient is presenting with SOB. Heparin per pharmacy consult placed for atrial fibrillation.  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 Admission complicated by acute CVA w/ expressive aphasia, acute GIB, and aspiration pneumonia.   Patient is on apixaban prior to arrival. Last dose is undetermined at this time. Have ordered baseline aPTT and heparin level.  Will require aPTT monitoring due to likely falsely high anti-Xa level secondary to DOAC use.  Hgb 7.1; plt 159 - slightly below baseline  Goal of Therapy:  Heparin level 0.3-0.7 units/ml aPTT 66-102 seconds Monitor platelets by anticoagulation protocol: Yes   Plan:  No initial  heparin bolus Start heparin infusion at 1400 units/hr at 2100 tonight Check aPTT & anti-Xa level in 8 hours and daily while on heparin Continue to monitor via aPTT until levels are correlated Continue to monitor H&H and platelets  Lorelei Pont, PharmD, BCPS 01/09/2021 5:35 PM ED Clinical Pharmacist -  [phone]

## 2021-01-09 NOTE — H&P (Signed)
Date: 01/09/2021               Patient Name:  Autumn [name] MRN: [id number]  DOB: 07/04/1950 Age / Sex: 70 y.o., female   PCP: Harlow Ohms, MD         Medical Service: Internal Medicine Teaching Service         Attending Physician: Dr. Sid Falcon, MD    First Contact: Dr. Ileene Musa Pager: [phone]  Second Contact: Dr. Collene Gobble Pager: [phone]       After Hours (After 5p/  First Contact Pager: [phone]  weekends / holidays): Second Contact Pager: [phone]   Chief Complaint: dyspnea  History of Present Illness:  Autumn [name] is [phone] with chronic kidney disease IV 2/2 nephrotic syndrome, cirrhosis 2/2 NASH, chronic diastolic heart failure, paroxysmal atrial fibrillation on Eliquis, hypertension, type II diabetes mellitus, hypothyroidism, gout, previous CVA presenting to [facility] via EMS from [facility] for dyspnea. History difficult to obtain given patient currently on BiPAP. Patient reports her symptoms started around the same time she tested positive for COVID, 2-3 days ago. Autumn [name] describes productive cough and dyspnea which has worsened over this time. She also mentions decreased appetite and urine output. She denies fever, nausea, vomiting, abdominal pain, foot pain.   Of note, Autumn [name] was recently admitted from 10/29-12/10 for septic shock 2/2 osteomyelitis of L foot and cellulitis. During that hospitalization she underwent amputation of second metatarsal and third metatarsal head of L foot. Other complications arose during that hospitalization, including acute CVA w/ expressive aphasia, acute GI bleed, and aspiration pneumonia.   Meds:  Acetaminophen 325mg  q6h PRN Albuterol 2 puffs QID PRN Allopurinol 50mg  QOD Alprazolam 0.25mg  BID PRN Apixaban 5mg  BID Ascorbic acid 500mg  BID Atorvastatin 40mg  QD Dicyclomine 10mg  QID Diltiazem 180mg  QD Ferrous gluconate 324mg  QD Hydralazine 100mg    Allergies: Allergies as of 01/09/2021 - Review Complete 01/09/2021   Allergen Reaction Noted   Codeine Nausea And Vomiting 02/08/2013   Past Medical History:  Diagnosis Date   Diabetes mellitus without complication ([place])    Hypertension    Kidney stone    Thyroid disease    Family History:  Family History  Problem Relation Age of Onset   Hypertension Mother    Social History:  - Patient presenting from [facility]; prior to previous hospitalization was at home. Unclear on how mobile she is and able to complete ADL/IADL. - Per chart review, former smoker, denies alcohol/drug use.  Review of Systems: A complete ROS was negative except as per HPI.   Physical Exam: Blood pressure 128/77, pulse 75, temperature (!) 97.5 F (36.4 C), temperature source Oral, resp. rate 15, SpO2 100 %. Gen: elderly female wearing bipap resting in bed CV: irregularly irregular rhythm, 3+ pitting edema of the arms, legs, and sacum Lungs: Coarse breath sounds bilaterally, tachypneic on bipap GI: soft, nontender, nondistended Skin: warm and dry Psych: normal affect  EKG: personally reviewed my interpretation is rate-controlled atrial fibrillation, left axis deviation, prolonged QT  CXR: personally reviewed my interpretation is pulmonary congestion w/ possible superimposed pneumonia  Assessment & Plan by Problem: Autumn [name] is [phone] with chronic kidney disease IV 2/2 nephrotic syndrome, cirrhosis 2/2 NASH, chronic diastolic heart failure, paroxysmal atrial fibrillation on Eliquis, hypertension, type II diabetes mellitus, hypothyroidism, gout, previous CVA admitted 12/29 with COVID-19 pneumonia and acute on chronic renal failure.  Principal Problem:   Pneumonia due to COVID-19 virus  #Acute hypoxic respiratory failure due to COVID-19  pneumonia Patient is short of breath and has a new oxygen requirement of 4 L [place]. Blood gas showed a pH of 7.316 and a bicarb of 16.8 so she was put on bipap. She is tolerating this ok so far. Given the severity of her presentation and  underlying comorbidities will start steroids but hold off on remdesivir given kidney function. On CXR Right upper and lower lobe opacities concerning for edema vs pneumonia. Mild vascular congestion seen on XR as well. Given her overall volume overloaded state and negative procal favor edema. Will continue with bipap overnight if she is able to tolerate it and see if her breathing improves after she receives her lasix/albumin - telemetry - trend inflammatory marks - continuous pulse ox - decadron 6 mg daily - f/u Bcx  #Acute on chronic renal failure Patient is extremely edematous on exam and reports a decreasing urine output over the last several days coinciding with her recent illness. Her baseline creatinine is about 3.7-3.9 and is up to 6.31 today. Suspect this is related to her underlying viral illness however will order renal ultrasound and consult nephrology. Given her acidosis and worsening edema she may require dialysis in the near future if her kidneys continue to worsen. She was given both IV fluids and 0.5 mg of bumex together in the ED. Nephrology is recommending albumin and lasix together but her overall renal prognosis is poor.  - f/u renal ultrasound - nephro consult, appreciate assistance - f/u UA - strict I/O - avoid nephrotoxic medications  #Chronic diastolic heart failure #Paroxysmal atrial fibrillation Given patient's poor renal function as above will hold eliquis and use heparin at this time. - holding home metoprolol 50, cardizem 180, and torsemide - continue lipitor 40  #Type II diabetes mellitus Last A1c was 6.3 in october - f/u A1c - takes victoza at thome - ssi  #Iron deficiency anemia On iron supplements at home. Ferritin 41 on admission - daily CBC - transfuse <7  L Foot Wound Has second left ray and metatarsal amputated at last hospital admission.Was using a wound vac at SNF however this was not able to be brought with EMS. -consult to wound  care  #Prior CVA Had expressive aphasia and dysphagia. LDL was 50 at prior hospitalization, continue heparin and atorvastatin as above. - NPO 2/2 bipap but will do dysphagia 3 diet once able  #Hypertension - holding metoprolol and cardizem   #Hypothyroidism  - Continue levothyroxine  #Gout - holding allopurinol 2/2 AKI  #Cirrhosis 2/2 NASH - CTM  Anxiety depression -  holding venlafazine in setting of AKI -  hold home xanax  Dispo: Admit patient to Inpatient with expected length of stay greater than 2 midnights.  Signed: [name], MD 01/09/2021, 6:16 PM  Pager: [phone] After 5pm on weekdays and 1pm on weekends: On Call pager: [phone]

## 2021-01-09 NOTE — Consult Note (Signed)
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  She recently had a prolonged hospitalization at [facility] from 10/29-12/10 for septic shock secondary to left foot osteomyelitis for which he underwent ray amputation of her second and third metatarsal.  She had acute kidney injury during that hospitalization with creatinine rising from her baseline of 3.12 range 3.5-3.8 at the time of discharge.  She also had acute GI bleed, A. fib with RVR and an acute CVA with expressive aphasia at that time and was discharged to a skilled nursing facility.  She was brought by EMS from [facility] today with 4-day history of increasingly worsening shortness of breath, productive cough and chest tightness along with decreased appetite and reduced urine output.  She denies any fevers but reports cold intolerance and some chills.  She denies any nausea, vomiting or diarrhea and reports to be undergoing wound care of her left foot at the nursing home.  Medication list reviewed and significant for no NSAIDs prior to admission, no recent iodinated intravenous contrast exposure and ongoing use of torsemide 20 mg daily.  Not on RAAS blocker, metformin or SGLT2 inhibitor.  Prior to her hospitalization at [facility], she reports that she was living independently with a group of 3 friends.  She does not have any healthcare power of attorney and does not have any family/children to assist with medical decision making.  She is not sure at this time whether she would undertake chronic hemodialysis and is unable to tell me if she would want resuscitation in the setting of cardiac  arrest or intubation/ventilator support for respiratory failure.  Past Medical History:  Diagnosis Date   Diabetes mellitus without complication ([place])    Hypertension    Kidney stone    Thyroid disease     Past Surgical History:  Procedure Laterality Date   ABDOMINAL HYSTERECTOMY     AMPUTATION Left 11/11/2020   Procedure: AMPUTATION RAY - 2nd metatarsal and 3rd metatarsal head;  Surgeon: [name], DPM;  Location: [facility] ORS;  Service: Podiatry;  Laterality: Left;   CAROTID ANGIOGRAPHY Left 11/22/2020   Procedure: CAROTID ANGIOGRAPHY;  Surgeon: Katha Cabal, MD;  Location: [place] [facility];  Service: Cardiovascular;  Laterality: Left;   COLONOSCOPY WITH PROPOFOL N/A 11/29/2020   Procedure: COLONOSCOPY WITH PROPOFOL;  Surgeon: Lesly Rubenstein, MD;  Location: [facility] ENDOSCOPY;  Service: Endoscopy;  Laterality: N/A;   ESOPHAGOGASTRODUODENOSCOPY (EGD) WITH PROPOFOL N/A 11/27/2020   Procedure: ESOPHAGOGASTRODUODENOSCOPY (EGD) WITH PROPOFOL;  Surgeon: Lesly Rubenstein, MD;  Location: [facility] ENDOSCOPY;  Service: Endoscopy;  Laterality: N/A;   URETERAL STENT PLACEMENT      Family History  Problem Relation Age of Onset   Hypertension Mother     Social History:  reports that she has quit smoking. She has never used smokeless tobacco. She reports that she does not drink alcohol and does not use drugs.  Allergies:  Allergies  Allergen Reactions   Codeine Nausea And Vomiting    Medications: I have reviewed the patient's current medications. Scheduled:  [START ON 01/10/2021] atorvastatin  40 mg Oral Daily   [START ON 01/10/2021] dexamethasone  6 mg Oral Daily   [START ON 01/10/2021] levothyroxine  100 mcg Oral Q0600   pantoprazole  40 mg Oral BID   [START ON 01/10/2021] venlafaxine  50 mg Oral BID WC    BMP Latest Ref Rng & Units 01/09/2021 01/09/2021 12/20/2020  Glucose 70 - 99 mg/dL - 129(H) 118(H)  BUN 8 - 23 mg/dL - 83(H) 54(H)  Creatinine 0.44 - 1.00 mg/dL -  6.31(H) 3.92(H)  Sodium 135 - 145 mmol/L 136 136 136  Potassium 3.5 - 5.1 mmol/L 3.8 3.9 4.3  Chloride 98 - 111 mmol/L - 106 110  CO2 22 - 32 mmol/L - 14(L) 18(L)  Calcium 8.9 - 10.3 mg/dL - 8.4(L) 8.8(L)   CBC Latest Ref Rng & Units 01/09/2021 01/09/2021 12/20/2020  WBC 4.0 - 10.5 K/uL - 5.0 13.6(H)  Hemoglobin 12.0 - 15.0 g/dL 7.1(L) 7.4(L) 8.6(L)  Hematocrit 36.0 - 46.0 % 21.0(L) 24.2(L) 27.4(L)  Platelets 150 - 400 K/uL - 159 101(L)    DG Chest Portable 1 View  Result Date: 01/09/2021 CLINICAL DATA:  Shortness of breath. EXAM: PORTABLE CHEST 1 VIEW COMPARISON:  November 21, 2020. FINDINGS: Stable cardiomegaly. Mild central pulmonary vascular congestion is again noted. Right upper and lower lobe opacities are noted concerning for edema or possibly pneumonia. Bony thorax is unremarkable. IMPRESSION: Stable cardiomegaly with mild central pulmonary vascular congestion. Right upper and lower lobe opacities are noted concerning for edema or possibly pneumonia. Electronically Signed   By: Marijo Conception M.D.   On: 01/09/2021 13:21    Review of Systems  Reason unable to perform ROS: Limited by BiPAP/expressive aphasia.  Pertinent positives in HPI above.  Blood pressure 128/77, pulse 75, temperature (!) 97.5 F (36.4 C), temperature source Oral, resp. rate 15, SpO2 100 %. Physical Exam Vitals and nursing note reviewed.  Constitutional:      Appearance: She is obese. She is ill-appearing.     Comments: On BiPAP  HENT:     Head: Normocephalic and atraumatic.     Right Ear: External ear normal.     Left Ear: External ear normal.  Eyes:     Extraocular Movements: Extraocular movements intact.     Conjunctiva/sclera: Conjunctivae normal.  Cardiovascular:     Rate and Rhythm: Normal rate. Rhythm irregular.     Pulses: Normal pulses.     Heart sounds: Normal heart sounds.  Pulmonary:     Breath sounds: Rales present.     Comments: Transmitted breath sounds bilaterally with fine rales  left base.  No wheeze/rhonchi Abdominal:     General: Abdomen is flat. Bowel sounds are normal.     Palpations: Abdomen is soft.     Tenderness: There is no abdominal tenderness. There is no guarding.  Musculoskeletal:     Cervical back: Neck supple.     Right lower leg: Edema present.     Left lower leg: Edema present.     Comments: 2-3+ bilateral lower extremity edema with 1-2+ thigh edema as well as edema over abdominal wall  Skin:    General: Skin is warm and dry.     Coloration: Skin is pale.     Findings: No rash.  Neurological:     Mental Status: She is alert.    Assessment/Plan: 1.  Acute kidney injury on chronic kidney disease stage IV/V: This appears to be likely hemodynamically mediated with decreased effective arterial blood volume in the setting of significant third spacing from underlying proteinuric chronic kidney disease/recent acute illness.  I suspect that she has an element of ATN associated  with her acute COVID pneumonia.  Unfortunately she has been having decreasing urine output leading up to her presentation to the emergency room and this portends a poor prognosis as intravenous fluid administration is limited by her evidence of anasarca and developing pulmonary edema seen on chest x-ray.  I will attempt to augment urine output with tandem use of albumin and furosemide and await results of renal ultrasound to verify that she does not have any obstruction (exam not supportive of urine retention). Avoid nephrotoxic medications including NSAIDs and iodinated intravenous contrast exposure unless the latter is absolutely indicated.  Preferred narcotic agents for pain control are hydromorphone, fentanyl, and methadone. Morphine should not be used. Avoid Baclofen and avoid oral sodium phosphate and magnesium citrate based laxatives / bowel preps. Continue strict Input and Output monitoring. Will monitor the patient closely with you and intervene or adjust therapy as indicated by  changes in clinical status/labs.  She does not have any acute indications for dialysis at this time and if this is undertaken, there will need to be lengthy discussion as well as weighing risks and benefits with the patient as she is currently hesitant to undertake it (more than likely will be long-term dialysis dependency given underlying severity of chronic kidney disease). 2.  Acute hypoxic respiratory failure secondary to COVID-19 pneumonia: Started on corticosteroids for significant hypoxic respiratory failure and appropriately holding off on remdesivir use in the setting of significantly impaired renal function.  Ongoing oxygenation support from BiPAP with possible transition to HFNC per primary service. 3.  Anemia: Likely secondary to multiple factors including underlying chronic kidney disease and recent acute illness/surgical losses.  I will repeat iron studies to decide on need for intravenous iron supplementation and thereafter begin ESA. 4.  Metabolic acidosis: This is mixed anion gap/non-anion gap when corrected for hypoalbuminemia.  Likely from underlying chronic kidney disease/cirrhosis and now worsened by acute kidney injury/ketosis from limited oral intake.  Begin nutritional support and monitor with supportive care of acute kidney injury. 5.  Atrial fibrillation: When seen she had stable hemodynamic status with good rate control, continue to hold beta-blocker and diltiazem at this time with transition to heparin drip from Eliquis. 6.  Left foot wound: Status post ray amputation during recent hospitalization, management per wound care and resume antibiotics (prior to admission was on doxycycline)   Hisayo Delossantos K. 01/09/2021, 6:03 PM

## 2021-01-09 NOTE — Progress Notes (Signed)
Pt removed from servo vent NIV and placed on bipap through the dream station. Volumes exceeding pt's 10cc with an IPAP of 10 and EPAP of 5. Pt tolerated fine. Flow set at 3lpm.

## 2021-01-09 NOTE — ED Notes (Signed)
RT called for ABG collection.

## 2021-01-09 NOTE — ED Notes (Signed)
Phlebotomy at bedside.

## 2021-01-10 DIAGNOSIS — J1282 Pneumonia due to coronavirus disease 2019: Secondary | ICD-10-CM

## 2021-01-10 DIAGNOSIS — N179 Acute kidney failure, unspecified: Secondary | ICD-10-CM

## 2021-01-10 DIAGNOSIS — U071 COVID-19: Principal | ICD-10-CM

## 2021-01-10 LAB — CBC WITH DIFFERENTIAL/PLATELET
Abs Immature Granulocytes: 0.03 10*3/uL (ref 0.00–0.07)
Basophils Absolute: 0 10*3/uL (ref 0.0–0.1)
Basophils Relative: 0 %
Eosinophils Absolute: 0 10*3/uL (ref 0.0–0.5)
Eosinophils Relative: 0 %
HCT: 21.3 % — ABNORMAL LOW (ref 36.0–46.0)
Hemoglobin: 6.7 g/dL — CL (ref 12.0–15.0)
Immature Granulocytes: 1 %
Lymphocytes Relative: 20 %
Lymphs Abs: 0.5 10*3/uL — ABNORMAL LOW (ref 0.7–4.0)
MCH: 29.6 pg (ref 26.0–34.0)
MCHC: 31.5 g/dL (ref 30.0–36.0)
MCV: 94.2 fL (ref 80.0–100.0)
Monocytes Absolute: 0.2 10*3/uL (ref 0.1–1.0)
Monocytes Relative: 6 %
Neutro Abs: 2 10*3/uL (ref 1.7–7.7)
Neutrophils Relative %: 73 %
Platelets: 132 10*3/uL — ABNORMAL LOW (ref 150–400)
RBC: 2.26 MIL/uL — ABNORMAL LOW (ref 3.87–5.11)
RDW: 18.7 % — ABNORMAL HIGH (ref 11.5–15.5)
WBC: 2.7 10*3/uL — ABNORMAL LOW (ref 4.0–10.5)
nRBC: 0 % (ref 0.0–0.2)

## 2021-01-10 LAB — GLUCOSE, CAPILLARY
Glucose-Capillary: 107 mg/dL — ABNORMAL HIGH (ref 70–99)
Glucose-Capillary: 108 mg/dL — ABNORMAL HIGH (ref 70–99)
Glucose-Capillary: 144 mg/dL — ABNORMAL HIGH (ref 70–99)
Glucose-Capillary: 132 mg/dL — ABNORMAL HIGH (ref 70–99)
Glucose-Capillary: 143 mg/dL — ABNORMAL HIGH (ref 70–99)

## 2021-01-10 LAB — COMPREHENSIVE METABOLIC PANEL
ALT: 9 U/L (ref 0–44)
AST: 14 U/L — ABNORMAL LOW (ref 15–41)
Albumin: 2.8 g/dL — ABNORMAL LOW (ref 3.5–5.0)
Alkaline Phosphatase: 54 U/L (ref 38–126)
Anion gap: 17 — ABNORMAL HIGH (ref 5–15)
BUN: 85 mg/dL — ABNORMAL HIGH (ref 8–23)
CO2: 14 mmol/L — ABNORMAL LOW (ref 22–32)
Calcium: 8.8 mg/dL — ABNORMAL LOW (ref 8.9–10.3)
Chloride: 108 mmol/L (ref 98–111)
Creatinine, Ser: 6.65 mg/dL — ABNORMAL HIGH (ref 0.44–1.00)
GFR, Estimated: 6 mL/min — ABNORMAL LOW (ref >60)
Glucose, Bld: 112 mg/dL — ABNORMAL HIGH (ref 70–99)
Potassium: 4 mmol/L (ref 3.5–5.1)
Sodium: 139 mmol/L (ref 135–145)
Total Bilirubin: 0.8 mg/dL (ref 0.3–1.2)
Total Protein: 5.4 g/dL — ABNORMAL LOW (ref 6.5–8.1)

## 2021-01-10 LAB — APTT
aPTT: 69 seconds — ABNORMAL HIGH (ref 24–36)
aPTT: >200 seconds (ref 24–36)
aPTT: >200 seconds (ref 24–36)

## 2021-01-10 LAB — PREPARE RBC (CROSSMATCH)

## 2021-01-10 LAB — HEMOGLOBIN A1C
Hgb A1c MFr Bld: 4.2 % — ABNORMAL LOW (ref 4.8–5.6)
Mean Plasma Glucose: 73.84 mg/dL

## 2021-01-10 LAB — D-DIMER, QUANTITATIVE: D-Dimer, Quant: 1.91 ug/mL-FEU — ABNORMAL HIGH (ref 0.00–0.50)

## 2021-01-10 LAB — MAGNESIUM: Magnesium: 2.2 mg/dL (ref 1.7–2.4)

## 2021-01-10 LAB — PHOSPHORUS: Phosphorus: 6 mg/dL — ABNORMAL HIGH (ref 2.5–4.6)

## 2021-01-10 LAB — HEMOGLOBIN AND HEMATOCRIT, BLOOD
HCT: 25.2 % — ABNORMAL LOW (ref 36.0–46.0)
Hemoglobin: 8.3 g/dL — ABNORMAL LOW (ref 12.0–15.0)

## 2021-01-10 LAB — FERRITIN: Ferritin: 35 ng/mL (ref 11–307)

## 2021-01-10 LAB — C-REACTIVE PROTEIN: CRP: 1.9 mg/dL — ABNORMAL HIGH (ref <1.0)

## 2021-01-10 MED ORDER — HEPARIN (PORCINE) 25000 UT/250ML-% IV SOLN
700.0000 [IU]/h | INTRAVENOUS | Status: DC
  Filled 2021-01-10: qty 250

## 2021-01-10 MED ORDER — METOPROLOL TARTRATE 50 MG PO TABS
50.0000 mg | ORAL_TABLET | Freq: Once | ORAL | Status: AC
  Administered 2021-01-10: 22:00:00 50 mg via ORAL
  Filled 2021-01-10: qty 1

## 2021-01-10 MED ORDER — INSULIN ASPART 100 UNIT/ML IJ SOLN
0.0000 [IU] | INTRAMUSCULAR | Status: DC
  Administered 2021-01-10 – 2021-01-11 (×5): 2 [IU] via SUBCUTANEOUS
  Administered 2021-01-11: 16:00:00 3 [IU] via SUBCUTANEOUS
  Administered 2021-01-11 (×2): 2 [IU] via SUBCUTANEOUS
  Administered 2021-01-11: 20:00:00 3 [IU] via SUBCUTANEOUS
  Administered 2021-01-12 (×3): 2 [IU] via SUBCUTANEOUS
  Administered 2021-01-13: 17:00:00 3 [IU] via SUBCUTANEOUS
  Administered 2021-01-13 – 2021-01-14 (×5): 2 [IU] via SUBCUTANEOUS
  Administered 2021-01-14: 01:00:00 3 [IU] via SUBCUTANEOUS
  Administered 2021-01-14 – 2021-01-15 (×3): 2 [IU] via SUBCUTANEOUS

## 2021-01-10 MED ORDER — SODIUM CHLORIDE 0.9 % IV SOLN
1.0000 g | INTRAVENOUS | Status: AC
  Administered 2021-01-10 – 2021-01-14 (×5): 1 g via INTRAVENOUS
  Filled 2021-01-10 (×5): qty 10

## 2021-01-10 MED ORDER — ADULT MULTIVITAMIN W/MINERALS CH
1.0000 | ORAL_TABLET | Freq: Every day | ORAL | Status: DC
  Administered 2021-01-10 – 2021-01-17 (×8): 1 via ORAL
  Filled 2021-01-10 (×8): qty 1

## 2021-01-10 MED ORDER — CHLORHEXIDINE GLUCONATE CLOTH 2 % EX PADS
6.0000 | MEDICATED_PAD | Freq: Every day | CUTANEOUS | Status: DC
  Administered 2021-01-10 – 2021-01-24 (×13): 6 via TOPICAL

## 2021-01-10 MED ORDER — SODIUM CHLORIDE 0.9% IV SOLUTION: Freq: Once | INTRAVENOUS | Status: AC

## 2021-01-10 MED ORDER — ENSURE ENLIVE PO LIQD
237.0000 mL | Freq: Three times a day (TID) | ORAL | Status: DC
  Administered 2021-01-10 – 2021-01-17 (×13): 237 mL via ORAL

## 2021-01-10 MED ORDER — SODIUM BICARBONATE 650 MG PO TABS
1300.0000 mg | ORAL_TABLET | Freq: Three times a day (TID) | ORAL | Status: DC
  Administered 2021-01-10 – 2021-01-15 (×16): 1300 mg via ORAL
  Filled 2021-01-10 (×16): qty 2

## 2021-01-10 NOTE — Progress Notes (Signed)
PTT>200. Heparin infusion placed on hold. MD paged with lab result. Awaiting further orders

## 2021-01-10 NOTE — Progress Notes (Signed)
Lab called with critical PTT. Pt's PTT greater than 200. Message sent to provider to make them aware.

## 2021-01-10 NOTE — Progress Notes (Signed)
HD#1 Subjective:  Overnight Events: NAEO   Patient says that her breathing is doing better. She is feeling cold this morning and would like another blanket.  Objective:  Vital signs in last 24 hours: Vitals:   01/09/21 2151 01/10/21 0055 01/10/21 0613 01/10/21 0800  BP: 130/89 128/85 (!) 123/95 (!) 138/102  Pulse: 73 97 (!) 118 91  Resp: 15 11 19 13   Temp: (!) 97 F (36.1 C) 98.6 F (37 C) 98.5 F (36.9 C) 97.8 F (36.6 C)  TempSrc: Oral Axillary Axillary Axillary  SpO2: 97% 100% 97% 100%  Weight: 85.6 kg  85.1 kg   Height: 5\' 7"  (1.702 m)      Supplemental O2: Nasal Cannula SpO2: 100 % O2 Flow Rate (L/min): 4 L/min FiO2 (%): 40 %   Physical Exam:  Gen: elderly female resting comfortably in bed CV: irregularly irregular rhythm, 3+ pitting edema of the arms, legs, and sacrum Lungs: Coarse breath sounds bilaterally, normal WOB on 2L [place] GI: soft, nontender, nondistended Neuro: alert, oriented to self and time, did not know she was in the hospital Skin: warm and dry Psych: normal affect  Filed Weights   01/09/21 1908 01/09/21 2151 01/10/21 0613  Weight: 96.4 kg 85.6 kg 85.1 kg     Intake/Output Summary (Last 24 hours) at 01/10/2021 0834 Last data filed at 01/10/2021 3149 Gross per 24 hour  Intake 200 ml  Output 0 ml  Net 200 ml   Net IO Since Admission: 200 mL [01/10/21 0834]  Pertinent Labs: CBC Latest Ref Rng & Units 01/10/2021 01/09/2021 01/09/2021  WBC 4.0 - 10.5 K/uL 2.7(L) - 5.0  Hemoglobin 12.0 - 15.0 g/dL 6.7(LL) 7.1(L) 7.4(L)  Hematocrit 36.0 - 46.0 % 21.3(L) 21.0(L) 24.2(L)  Platelets 150 - 400 K/uL 132(L) - 159    CMP Latest Ref Rng & Units 01/10/2021 01/09/2021 01/09/2021  Glucose 70 - 99 mg/dL 112(H) - -  BUN 8 - 23 mg/dL 85(H) - -  Creatinine 0.44 - 1.00 mg/dL 6.65(H) - -  Sodium 135 - 145 mmol/L 139 136 -  Potassium 3.5 - 5.1 mmol/L 4.0 3.8 -  Chloride 98 - 111 mmol/L 108 - -  CO2 22 - 32 mmol/L 14(L) - -  Calcium 8.9 - 10.3 mg/dL  8.8(L) - -  Total Protein 6.5 - 8.1 g/dL 5.4(L) - 5.2(L)  Total Bilirubin 0.3 - 1.2 mg/dL 0.8 - 0.7  Alkaline Phos 38 - 126 U/L 54 - 66  AST 15 - 41 U/L 14(L) - 17  ALT 0 - 44 U/L 9 - 9    Imaging: US RENAL  Result Date: 01/09/2021 CLINICAL DATA:  AKI EXAM: RENAL / URINARY TRACT ULTRASOUND COMPLETE COMPARISON:  11/09/2020 FINDINGS: Right Kidney: Renal measurements: 9.1 x 4.5 x 4.3 cm = volume: 93.1 mL. Increased renal echogenicity. No mass or hydronephrosis visualized. Left Kidney: Renal measurements: 8.9 x 4.4 x 3.9 cm = volume: 79.1 mL. Increased renal echogenicity. No mass or hydronephrosis visualized. Bladder: Appears normal for degree of bladder distention. Other: Ascites in the right upper quadrant. Left-greater-than-right pleural effusions. IMPRESSION: 1. Increased bilateral renal parenchymal echogenicity, similar to the prior exam, consistent with chronic medical renal disease. Otherwise unremarkable kidneys. 2. Right upper quadrant ascites. 3. Left-greater-than-right pleural effusions. Electronically Signed   By: Merilyn Baba M.D.   On: 01/09/2021 19:00   DG Chest Portable 1 View  Result Date: 01/09/2021 CLINICAL DATA:  Shortness of breath. EXAM: PORTABLE CHEST 1 VIEW COMPARISON:  November 21, 2020.  FINDINGS: Stable cardiomegaly. Mild central pulmonary vascular congestion is again noted. Right upper and lower lobe opacities are noted concerning for edema or possibly pneumonia. Bony thorax is unremarkable. IMPRESSION: Stable cardiomegaly with mild central pulmonary vascular congestion. Right upper and lower lobe opacities are noted concerning for edema or possibly pneumonia. Electronically Signed   By: Marijo Conception M.D.   On: 01/09/2021 13:21    Assessment/Plan:   Principal Problem:   Pneumonia due to COVID-19 virus   Patient Summary: Autumn [name] is a 70 y.o. with chronic kidney disease IV 2/2 nephrotic syndrome, cirrhosis 2/2 NASH, chronic diastolic heart failure,  paroxysmal atrial fibrillation on Eliquis, hypertension, type II diabetes mellitus, hypothyroidism, gout, previous CVA admitted 12/29 with COVID-19 pneumonia and acute on chronic renal failure   #Acute hypoxic respiratory failure due to COVID-19 pneumonia Patient was on bipap overnight. Lungs sound better this morning and patient is less tachypneic and was able to transition to [place] from bipap and has been maintaining her oxygen. May be able to wean her back to room air today. Breathing may have improved in the setting of her lasix though she is still oliguric. - telemetry - inflammatory markers improving - continuous pulse ox - decadron 6 mg daily - f/u Bcx    #Acute on chronic renal failure Nephrology is gave albumin and lasix together yesterday however patient has not urinated overnight. Renal ultrasound similar to prior exam with medical renal disease, no hydronephrosis noted. Had foley placed so we can better monitor UOP, about 300 of urine has been put out. UA showed 30 protein, hazy color, large amount of leukocytes, and rare bacteria. Breathing has improved so she may be responding to the lasix.  - nephro consult, appreciate assistance - strict I/O; foley - avoid nephrotoxic medications   #Chronic diastolic heart failure #Paroxysmal atrial fibrillation PTT elevated to >200 overnight, heparin turned off temporarily and restarted at a lower rate.  - f/u repeat PTT - heparin per pharmacy - holding home metoprolol 50, cardizem 180, and torsemide - continue lipitor 40   #Type II diabetes mellitus Last A1c was 6.3 in October, repeat today 4.2 likely artificially low 2/2 CKD and anemia - takes victoza at thome - ssi  #combined Iron deficiency anemia and anemia of chronic disease On iron supplements at home. Ferritin 41 on admission. Hemoglobin dropped to 6.7 this morning. Will transfuse 1 unit pRBC. May need EPO in the future. Total iron deficit about 1500 prior to transfusion. - daily  CBC - transfuse <7 - f/u post transfusion H/H - transfuse 1 unit 12/30   # L Foot Wound Has second left ray and metatarsal amputated at last hospital admission.Was using a wound vac at SNF however this was not able to be brought with EMS. -consult to wound care   #Prior CVA Had expressive aphasia and dysphagia. LDL was 50 at prior hospitalization. - continue heparin and atorvastatin as above. -  dysphagia 3 diet once able   #Hypertension - holding metoprolol and cardizem    #Hypothyroidism  - Continue levothyroxine   #Gout - holding allopurinol 2/2 AKI   #Cirrhosis 2/2 NASH - CTM   Anxiety depression -  holding venlafazine in setting of AKI -  hold home xanax   [name], MD Internal Medicine Resident PGY-1 Pager [phone] Please contact the on call pager after 5 pm and on weekends at [phone].

## 2021-01-10 NOTE — Progress Notes (Signed)
ANTICOAGULATION CONSULT NOTE   Pharmacy Consult for heparin Indication: atrial fibrillation  Allergies  Allergen Reactions   Codeine Nausea And Vomiting    Patient Measurements: Height: 5\' 7"  (170.2 cm) Weight: 85.1 kg (187 lb 9.8 oz) IBW/kg (Calculated) : 61.6 Heparin Dosing Weight: 79.6 kg  Vital Signs: Temp: 98.3 F (36.8 C) (12/30 1540) Temp Source: Oral (12/30 1540) BP: 136/89 (12/30 1540) Pulse Rate: 133 (12/30 1540)  Labs: Recent Labs    01/09/21 1242 01/09/21 1326 01/09/21 1336 01/09/21 1523 01/09/21 1814 01/09/21 2242 01/10/21 0258 01/10/21 0629 01/10/21 1622  HGB 7.4*  --  7.1*  --   --   --   --  6.7* 8.3*  HCT 24.2*  --  21.0*  --   --   --   --  21.3* 25.2*  PLT 159  --   --   --   --   --   --  132*  --   APTT  --   --   --   --   --  >200* 69*  --  >200*  LABPROT  --   --   --   --   --  34.4*  --   --   --   INR  --   --   --   --   --  3.4*  --   --   --   HEPARINUNFRC  --   --   --   --  >1.10*  --   --   --   --   CREATININE 6.31*  --   --   --   --   --   --  6.65*  --   TROPONINIHS  --  17  --  17  --   --   --   --   --      Estimated Creatinine Clearance: 9.1 mL/min (A) (by C-G formula based on SCr of 6.65 mg/dL (H)).   Inpatient infusions:   cefTRIAXone (ROCEPHIN)  IV 1 g (01/10/21 1223)   heparin 1,000 Units/hr (01/10/21 1146)    Assessment: 76 yof with a history of AF, osteomyelitis of lt foot, CKD, liver disease 2/2 NASH, nephrotic syndrome, HF. Patient is presenting with SOB. Heparin per pharmacy consult placed for atrial fibrillation.  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 Admission complicated by acute CVA w/ expressive aphasia, acute GIB, and aspiration pneumonia.   Patient is on apixaban prior to arrival. Last dose is undetermined at this time. Have ordered baseline aPTT and heparin level.  Will  require aPTT monitoring due to likely falsely high anti-Xa level secondary to DOAC use.  2nd: Suspected aPTT >200 at 16:22 was in error as drawn from same arm IV heparin was infusing. Ordered a STAT redraw which was delayed due to phleb staffing? Repeat aPTT also >200. No bleeding reported, Hgb up to 8.3 s/p 1 unit PRBC.  Goal of Therapy:  Heparin level 0.3-0.7 units/ml aPTT 66-102 seconds Monitor platelets by anticoagulation protocol: Yes   Plan:  Hold heparin infusion for 1 hour (spoke with RN) Resume heparin infusion at 700 units/hr 8 hr aPTT Daily heparin level, aPTT, CBC Monitor for s/sx of bleeding  Thank you for involving pharmacy in this patient's care.  Renold Genta, PharmD, BCPS Clinical Pharmacist Clinical phone for 01/10/2021 until 10p is [phone] 01/10/2021 5:39 PM  **Pharmacist  phone directory can be found on [email or web address] listed under [place]**

## 2021-01-10 NOTE — Progress Notes (Signed)
Initial Nutrition Assessment  DOCUMENTATION CODES:  Not applicable  INTERVENTION:  Add Ensure Plus High Protein po TID, each supplement provides 350 kcal and 20 grams of protein.   Add MVI with minerals daily.  Encourage PO and supplement intake.  NUTRITION DIAGNOSIS:  Increased nutrient needs related to acute illness (COVID-19) as evidenced by estimated needs.  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 Admitted with PNA due to COVID-19.  RD working remotely. Attempted to call patient's room phone. Pt did not answer.  Pt with limited weight history in Epic. Per Care Everywhere, pt weighed 90.2 kg on 09/02/20.  Pt currently weighs 85.1 kg. Pt has lost ~11 lbs (5.5%) in the last 4 months, which is not necessarily significant for the time frame.  Of note, pt with moderate BUE and BLE edema.   Pt recently admitted to [facility] with diabetic foot ulcer and osteomyelitis, as well as sepsis. Followed closely by RD.  Medications: reviewed; dexamethasone, SSI, Synthroid, Protonix BID, Na Bicarb TID  Labs: reviewed; CBG 107-120 (H) HbA1c: 4.2% (01/10/2021) - likely artificially low 2/2 CKD and anemia  NUTRITION - FOCUSED PHYSICAL EXAM: Unable to perform - defer to follow-up  Diet Order:   Diet Order             DIET DYS 3 Room service appropriate? Yes; Fluid consistency: Thin  Diet effective now                  EDUCATION NEEDS:  No education needs have been  identified at this time  Skin:  Skin Assessment: Skin Integrity Issues: Skin Integrity Issues:: Other (Comment) Other: MASD on buttocks  Last BM:  01/08/21  Height:  Ht Readings from Last 1 Encounters:  01/09/21 5\' 7"  (1.702 m)   Weight:  Wt Readings from Last 1 Encounters:  01/10/21 85.1 kg   BMI:  Body mass index is 29.38 kg/m.  Estimated Nutritional Needs:  Kcal:  1900-2100 Protein:  90-105 grams Fluid:  >1.9 L  Derrel Nip, RD, LDN (she/her/hers) Clinical Inpatient Dietitian RD Pager/After-Hours/Weekend Pager # in [place]

## 2021-01-10 NOTE — Progress Notes (Signed)
°  Date: 01/10/2021  Patient name: Autumn [name]  Medical record number: [id number]  Date of birth: 26-Dec-1950   I have seen and evaluated Autumn [name] and discussed their care with the Residency Team. Briefly, Autumn [name] is a 70 year old woman who presented with acute respiratory failure and COVID, first testing + about 2-3 days prior to admission.  She has further had decreased PO intake and decreased urine output leading to an AKI.  She does have CKD at baseline.  She recently had a prolonged admission with medical issues including acute CVA at that time, GI bleeding and pneumonia.  Her BUN on admission was 83, Cr was 6.31 (baseline around 3-4).  BNP 660, H/H 7.4 and 24.2.  UA showed pyuria with modest proteinuria.  BC X 2 have NG in 24 hours.   PMHx, Fam Hx, and/or Soc Hx : Patient lives in SNF and presents from there for AMS and encephalopathy which is improving.   Vitals:   01/10/21 1040 01/10/21 1050  BP: 135/87   Pulse: (!) 101   Resp: 13   Temp: 98.2 F (36.8 C) 98.2 F (36.8 C)  SpO2: 100%    Gen: Elderly woman, wearing bipap, but this is not on.  Oxygen running through the machine.  Confused, unsure of where she was, but alert and oriented to name and year.  Eyes: Widely open eyes, anicteric sclerae HENT: neck is supple CV: Mild tachycardia, regular, no murmur Pulm: Course breath sounds, though mostly clear, no wheezing, breathing comfortably on 2LNC Abd: Soft, +BS Psych: Confused, pleasant, clear speech.   CXR with right upper and lower lobe opacities, concerning for pneumonia (read by Dr. Nyoka Cowden).   Assessment and Plan: I have seen and evaluated the patient as outlined above. 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  Will need to do more chart review to discover if patient had nephrotic range protein in the past.    Acute metabolic vs. Toxic encephalopathy - Improving with treatment of COVID, oxygen   L foot wound - Wound care has seen, replacement wound vac ordered while in house.   Other issues per Dr. Flora Lipps daily note.  We will be monitoring and following renal recommendations to improve renal function.  Dialysis may be needed this hospitalization, but no indication now.   Sid Falcon, MD 12/30/202210:56 AM

## 2021-01-10 NOTE — TOC Progression Note (Signed)
Transition of Care [facility]) - Progression Note    Patient Details  Name: Autumn [name] MRN: [id number] Date of Birth: May 14, 1950  Transition of Care [facility]) CM/SW [place], RN Phone Number:[phone]  01/10/2021, 3:47 PM  Clinical Narrative:     Transition of Care [facility]) Screening Note   Patient Details  Name: Autumn [name] Date of Birth: [birth year]   Transition of Care [facility]) CM/SW Contact:    Angelita Ingles, RN Phone Number: 01/10/2021, 3:47 PM    Transition of Care Department [facility]) has reviewed patient and no TOC needs have been identified at this time. We will continue to monitor patient advancement through interdisciplinary progression rounds. If new patient transition needs arise, please place a TOC consult.          Expected Discharge Plan and Services                                                 Social Determinants of Health (SDOH) Interventions    Readmission Risk Interventions No flowsheet data found.

## 2021-01-10 NOTE — Progress Notes (Signed)
Ms. Gunderson is a 70 year old with CKD, paroxysmal A. Fib, and HFrEF. Paged due to heart rate being in the 130s to 150s.  On chart review heart rate has been steadily increasing throughout the day.  Vitals:   01/10/21 1400 01/10/21 1540  BP: (!) 141/89 136/89  Pulse: (!) 120 (!) 133  Resp: 15 18  Temp: 98.7 F (37.1 C) 98.3 F (36.8 C)  SpO2: 98% 97%  Gen: no distress, laying in bed Heart: irregularly irregular, tachycardic Lungs: CTAB, normal pulmonary effort on 2 LNC Abd: Soft, non tender MSK: 2+ pitting edema of arms and legs Psych: alert and oriented to self and year  EKG with Afib with RVR  A/P: Home meds of metoprolol, cardizem, and diuretics initially held due to concern for ATN. EKG showed a fib with RVR, but patient is asymptomatic. Will give one dose of home medication metoprolol tartrate 50 mg at this time.

## 2021-01-10 NOTE — Consult Note (Addendum)
[facility] Nurse Consult Note: Consult requested for left foot. Pt is in isolation for Covid and was wearing a negative pressure wound therapy device prior to admission at a SNF, according to progress notes.  This was removed when she was transferred to the hospital.  Reason for Consult: Left anterior foot with full thickness post-op wound, 7X2.5X1.5cm, 85% red, 15% yellow slough, small amt tan drainage, no odor or fluctuance.  Pt was medicated for pain prior to the procedure and tolerated with minimal amt discomfort.  Applied barrier ring to wound edges to attempt to maintain a seal, then 1 piece black foam to 111mm cont suction.   Dressing procedure/placement/frequency:  Topical treatment orders provided for bedside nurses to perform as follows:  1. WOC will change left foot Vac Q M/W/F 2. Please remove Vac dressing to left foot when Pt is discharged and apply moist gauze dressing.  Facility can reapply Vac dressing after she is transferred.   [place] team will plan to change dressing on Mon if patient is still in the hospital at that time. Julien Girt MSN, RN, [place], [place], [place]

## 2021-01-10 NOTE — Progress Notes (Signed)
Nephrology Follow-Up Consult note   Assessment/Recommendations: Autumn [name] is a/an 70 y.o. female with a past medical history significant for DM2, HTN, hypothyroidism, NASH cirrhosis, pAfib, CVA, admitted for COVID pneumonia c/b AKI.     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  Termed as being proteinuric kidney disease.  UA does show some protein but not extensive.  CKD has been progressive also with recent AKI during hospitalization for osteomyelitis.  Baseline creatinine around 3.6-4 at this time.  Now with AKI likely secondary to ATN associated with COVID.  Creatinine slightly worse today not fully unexpected.  Respiratory status has improved.  Some concern for mild volume overload but will hold diuretics for now -Continue with discussions regarding dialysis.  May need to use during this hospitalization but no acute indication at this time. -Hold diuretics and fluids for now -Correct anemia -Continue to monitor daily Cr, Dose meds for GFR -Monitor Daily I/Os, Daily weight  -Maintain MAP>65 for optimal renal perfusion.  -Avoid nephrotoxic medications including NSAIDs and Vanc/Zosyn combo  Acute hypoxic respiratory failure secondary to COVID-pneumonia: Improved today.  Continue management per primary team.  Hold diuretics  Anemia: Likely multifactorial with acute illness, chronic inflammation, CKD contributing.  Hemoglobin 6.7 today.  Transfusion per primary team.  Consider Aranesp in the near future.  Metabolic acidosis: Mixed anion gap and non-anion gap likely secondary to acute kidney injury.  Increase sodium bicarb to 1300 mg 3 times daily  UTI: Pyuria on urinalysis.  Minimal symptoms were because of AKI we will treat.  Ceftriaxone 1 g x 5 days.    Atrial fibrillation: Management per primary Left foot wound: Status post amputation.  Management per  primary Diabetes Mellitus Type 2 with Hyperglycemia: Management per primary    Recommendations conveyed to primary service.    [facility] 01/10/2021 10:06 AM  ___________________________________________________________  CC: COVID pneumonia, AKI  Interval History/Subjective: Patient states she is feeling a lot better today.  Feels like her breathing has improved.  Not as tired.  Feels like she urinated some yesterday but not as much as usual.    Creatinine slightly higher at 6.6.  Undocumented urine output.  UA overall unimpressive minimal proteinuria but does have a fair amount of pyuria.  Hgb 6.7. Renal ultrasound does show signs of chronic kidney disease but otherwise no significant concerns.  We had further discussions regarding dialysis today.  The patient is trying to best understand what dialysis would look like for her.  She has not made a decision on whether it is something she would want to do here or outside the hospital.  However, I do think that she would do dialysis if it was necessary.  She will continue to think about it.   Medications:  Current Facility-Administered Medications  Medication Dose Route Frequency Provider Last Rate Last Admin   0.9 %  sodium chloride infusion (Manually program via Guardrails IV Fluids)   Intravenous Once Demaio, Alexa, MD       acetaminophen (TYLENOL) tablet 650 mg  650 mg Oral Q6H PRN Sanjuan Dame, MD       albuterol (PROVENTIL) (2.5 MG/3ML) 0.083% nebulizer solution 3 mL  3 mL Inhalation Q4H PRN Sanjuan Dame, MD       atorvastatin (LIPITOR) tablet 40 mg  40 mg Oral Daily Sanjuan Dame, MD   40 mg at 01/10/21 0924   dexamethasone (DECADRON)  tablet 6 mg  6 mg Oral Daily Sanjuan Dame, MD   6 mg at 01/10/21 5462   heparin ADULT infusion 100 units/mL (25000 units/262mL)  1,000 Units/hr Intravenous Continuous Erenest Blank, RPH 10 mL/hr at 01/10/21 0454 1,000 Units/hr at 01/10/21 0454    insulin aspart (novoLOG) injection 0-15 Units  0-15 Units Subcutaneous Q4H Sanjuan Dame, MD       levothyroxine (SYNTHROID) tablet 100 mcg  100 mcg Oral Q0600 Sanjuan Dame, MD       pantoprazole (PROTONIX) EC tablet 40 mg  40 mg Oral BID Sanjuan Dame, MD   40 mg at 01/10/21 7035   senna-docusate (Senokot-S) tablet 1 tablet  1 tablet Oral QHS PRN Sanjuan Dame, MD       sodium bicarbonate tablet 1,300 mg  1,300 mg Oral TID Reesa Chew, MD          Review of Systems: 10 systems reviewed and negative except per interval history/subjective  Physical Exam: Vitals:   01/10/21 0613 01/10/21 0800  BP: (!) 123/95 (!) 138/102  Pulse: (!) 118 91  Resp: 19 13  Temp: 98.5 F (36.9 C) 97.8 F (36.6 C)  SpO2: 97% 100%   No intake/output data recorded.  Intake/Output Summary (Last 24 hours) at 01/10/2021 1006 Last data filed at 01/10/2021 0093 Gross per 24 hour  Intake 200 ml  Output 0 ml  Net 200 ml   Constitutional: Chronically ill-appearing, lying in bed, no distress ENMT: ears and nose without scars or lesions, dry mucous membranes CV: normal rate, no edema Respiratory: Bilateral chest rise with no increased work of breathing Gastrointestinal: soft, non-tender, no palpable masses or hernias Skin: no visible lesions or rashes Psych: alert, judgement/insight appropriate, appropriate mood and affect   Test Results I personally reviewed new and old clinical labs and radiology tests Lab Results  Component Value Date   NA 139 01/10/2021   K 4.0 01/10/2021   CL 108 01/10/2021   CO2 14 (L) 01/10/2021   BUN 85 (H) 01/10/2021   CREATININE 6.65 (H) 01/10/2021   CALCIUM 8.8 (L) 01/10/2021   ALBUMIN 2.8 (L) 01/10/2021   PHOS 6.0 (H) 01/10/2021

## 2021-01-10 NOTE — Progress Notes (Signed)
Received call from lab stating pt's Hgb is 6.7. Message sent to provider to make aware.

## 2021-01-10 NOTE — Progress Notes (Incomplete)
ANTICOAGULATION CONSULT NOTE   Pharmacy Consult for Heparin Indication: atrial fibrillation  Allergies  Allergen Reactions   Codeine Nausea And Vomiting    Patient Measurements: Height: 5\' 7"  (170.2 cm) Weight: 85.1 kg (187 lb 9.8 oz) IBW/kg (Calculated) : 61.6 Heparin Dosing Weight: 96.4 kg  Vital Signs: Temp: 97.8 F (36.6 C) (12/30 0800) Temp Source: Axillary (12/30 0800) BP: 138/102 (12/30 0800) Pulse Rate: 91 (12/30 0800)  Labs: Recent Labs    01/09/21 1242 01/09/21 1326 01/09/21 1336 01/09/21 1523 01/09/21 1814 01/09/21 2242 01/10/21 0258 01/10/21 0629  HGB 7.4*  --  7.1*  --   --   --   --  6.7*  HCT 24.2*  --  21.0*  --   --   --   --  21.3*  PLT 159  --   --   --   --   --   --  132*  APTT  --   --   --   --   --  >200* 69*  --   LABPROT  --   --   --   --   --  34.4*  --   --   INR  --   --   --   --   --  3.4*  --   --   HEPARINUNFRC  --   --   --   --  >1.10*  --   --   --   CREATININE 6.31*  --   --   --   --   --   --  6.65*  TROPONINIHS  --  17  --  17  --   --   --   --      Estimated Creatinine Clearance: 9.1 mL/min (A) (by C-G formula based on SCr of 6.65 mg/dL (H)).   Medical History: Past Medical History:  Diagnosis Date   Diabetes mellitus without complication ([place])    Hypertension    Kidney stone    Thyroid disease     Medications:  Medications Prior to Admission  Medication Sig Dispense Refill Last Dose   acetaminophen (TYLENOL) 325 MG tablet Take 325 mg by mouth every 6 (six) hours as needed.      albuterol (VENTOLIN HFA) 108 (90 Base) MCG/ACT inhaler Inhale 2 puffs into the lungs 4 (four) times daily as needed.      allopurinol (ZYLOPRIM) 100 MG tablet Take 0.5 tablets (50 mg total) by mouth every other day. 15 tablet 0    ALPRAZolam (XANAX) 0.25 MG tablet Take 1 tablet (0.25 mg total) by mouth 2 (two) times daily as needed for anxiety. 15 tablet 0    apixaban (ELIQUIS) 5 MG TABS tablet Take 1 tablet (5 mg total) by mouth 2  (two) times daily. 60 tablet 0    ascorbic acid (VITAMIN C) 500 MG tablet Take 1 tablet (500 mg total) by mouth 2 (two) times daily. 60 tablet 0    atorvastatin (LIPITOR) 40 MG tablet Take 1 tablet (40 mg total) by mouth daily. 30 tablet 0    dicyclomine (BENTYL) 10 MG capsule Take 1 capsule (10 mg total) by mouth 4 (four) times daily -  before meals and at bedtime. 120 capsule 0    diltiazem (CARDIZEM CD) 180 MG 24 hr capsule Take 1 capsule (180 mg total) by mouth daily. 30 capsule 0    doxycycline (VIBRA-TABS) 100 MG tablet Take 100 mg by mouth 2 (two) times daily.  ferrous gluconate (FERGON) 324 MG tablet Take 1 tablet (324 mg total) by mouth daily with breakfast. 30 tablet 0    hydrALAZINE (APRESOLINE) 100 MG tablet Take 1 tablet (100 mg total) by mouth every 8 (eight) hours. 90 tablet 0    levothyroxine (SYNTHROID) 100 MCG tablet Take 100 mcg by mouth every morning.      liraglutide (VICTOZA) 18 MG/3ML SOPN Inject into the skin.      meclizine (ANTIVERT) 12.5 MG tablet Take 12.5 mg by mouth 3 (three) times daily as needed.      metoprolol tartrate (LOPRESSOR) 50 MG tablet Take 1 tablet (50 mg total) by mouth 2 (two) times daily. 60 tablet 0    metroNIDAZOLE (FLAGYL) 500 MG tablet       Multiple Vitamin (MULTIVITAMIN WITH MINERALS) TABS tablet Take 1 tablet by mouth daily. 30 tablet 0    Nutritional Supplements (,FEEDING SUPPLEMENT, PROSOURCE PLUS) liquid Take 30 mLs by mouth 3 (three) times daily between meals. 2700 mL 0    ondansetron (ZOFRAN) 4 MG tablet Place 1 tablet (4 mg total) into feeding tube every 6 (six) hours as needed for nausea or vomiting. 30 tablet 0    pantoprazole (PROTONIX) 40 MG tablet Take 1 tablet (40 mg total) by mouth 2 (two) times daily. 60 tablet 0    polyethylene glycol (MIRALAX / GLYCOLAX) 17 g packet Take 17 g by mouth daily as needed for severe constipation. 14 each 0    torsemide (DEMADEX) 20 MG tablet Take 1 tablet (20 mg total) by mouth daily. 30 tablet 0     venlafaxine (EFFEXOR) 50 MG tablet Take 1 tablet (50 mg total) by mouth 2 (two) times daily. 60 tablet 0     Scheduled:   atorvastatin  40 mg Oral Daily   dexamethasone  6 mg Oral Daily   furosemide  80 mg Intravenous Once   insulin aspart  0-15 Units Subcutaneous Q4H   levothyroxine  100 mcg Oral Q0600   pantoprazole  40 mg Oral BID   sodium bicarbonate  650 mg Oral TID   Infusions:   albumin human     heparin 1,000 Units/hr (01/10/21 0454)   PRN: acetaminophen, albuterol, senna-docusate  Assessment: 49 yof with a history of AF, osteomyelitis of lt foot, CKD, liver disease 2/2 NASH, nephrotic syndrome, HF. Patient is presenting with SOB. Heparin per pharmacy consult placed for atrial fibrillation.  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 Admission complicated by acute CVA w/ expressive aphasia, acute GIB, and aspiration pneumonia.   Patient is on apixaban prior to arrival. Last dose is undetermined at this time. Baseline heparin level elevated which indicates she has taken apixaban recently.   Will continue aPTT monitoring due to falsely high anti-Xa level secondary to DOAC use.  Hgb continues to trend down to 6.7, plt also down ot 132. No bleeding issues noted.   Goal of Therapy:  Heparin level 0.3-0.7 units/ml aPTT 66-102 seconds Monitor platelets by anticoagulation protocol: Yes   Plan:    Erin Hearing PharmD., BCPS Clinical Pharmacist 01/10/2021 8:26 AM

## 2021-01-10 NOTE — Progress Notes (Signed)
ANTICOAGULATION CONSULT NOTE   Pharmacy Consult for Heparin Indication: atrial fibrillation  Allergies  Allergen Reactions   Codeine Nausea And Vomiting    Patient Measurements: Height: 5\' 7"  (170.2 cm) Weight: 85.6 kg (188 lb 11.4 oz) IBW/kg (Calculated) : 61.6 Heparin Dosing Weight: 96.4 kg  Vital Signs: Temp: 98.6 F (37 C) (12/30 0055) Temp Source: Axillary (12/30 0055) BP: 128/85 (12/30 0055) Pulse Rate: 97 (12/30 0055)  Labs: Recent Labs    01/09/21 1242 01/09/21 1326 01/09/21 1336 01/09/21 1523 01/09/21 1814 01/09/21 2242 01/10/21 0258  HGB 7.4*  --  7.1*  --   --   --   --   HCT 24.2*  --  21.0*  --   --   --   --   PLT 159  --   --   --   --   --   --   APTT  --   --   --   --   --  >200* 69*  LABPROT  --   --   --   --   --  34.4*  --   INR  --   --   --   --   --  3.4*  --   HEPARINUNFRC  --   --   --   --  >1.10*  --   --   CREATININE 6.31*  --   --   --   --   --   --   TROPONINIHS  --  17  --  17  --   --   --      Estimated Creatinine Clearance: 9.6 mL/min (A) (by C-G formula based on SCr of 6.31 mg/dL (H)).   Medical History: Past Medical History:  Diagnosis Date   Diabetes mellitus without complication ([place])    Hypertension    Kidney stone    Thyroid disease     Medications:  Medications Prior to Admission  Medication Sig Dispense Refill Last Dose   acetaminophen (TYLENOL) 325 MG tablet Take 325 mg by mouth every 6 (six) hours as needed.      albuterol (VENTOLIN HFA) 108 (90 Base) MCG/ACT inhaler Inhale 2 puffs into the lungs 4 (four) times daily as needed.      allopurinol (ZYLOPRIM) 100 MG tablet Take 0.5 tablets (50 mg total) by mouth every other day. 15 tablet 0    ALPRAZolam (XANAX) 0.25 MG tablet Take 1 tablet (0.25 mg total) by mouth 2 (two) times daily as needed for anxiety. 15 tablet 0    apixaban (ELIQUIS) 5 MG TABS tablet Take 1 tablet (5 mg total) by mouth 2 (two) times daily. 60 tablet 0    ascorbic acid (VITAMIN C) 500 MG  tablet Take 1 tablet (500 mg total) by mouth 2 (two) times daily. 60 tablet 0    atorvastatin (LIPITOR) 40 MG tablet Take 1 tablet (40 mg total) by mouth daily. 30 tablet 0    dicyclomine (BENTYL) 10 MG capsule Take 1 capsule (10 mg total) by mouth 4 (four) times daily -  before meals and at bedtime. 120 capsule 0    diltiazem (CARDIZEM CD) 180 MG 24 hr capsule Take 1 capsule (180 mg total) by mouth daily. 30 capsule 0    doxycycline (VIBRA-TABS) 100 MG tablet Take 100 mg by mouth 2 (two) times daily.      ferrous gluconate (FERGON) 324 MG tablet Take 1 tablet (324 mg total) by mouth daily with breakfast. 30  tablet 0    hydrALAZINE (APRESOLINE) 100 MG tablet Take 1 tablet (100 mg total) by mouth every 8 (eight) hours. 90 tablet 0    levothyroxine (SYNTHROID) 100 MCG tablet Take 100 mcg by mouth every morning.      liraglutide (VICTOZA) 18 MG/3ML SOPN Inject into the skin.      meclizine (ANTIVERT) 12.5 MG tablet Take 12.5 mg by mouth 3 (three) times daily as needed.      metoprolol tartrate (LOPRESSOR) 50 MG tablet Take 1 tablet (50 mg total) by mouth 2 (two) times daily. 60 tablet 0    metroNIDAZOLE (FLAGYL) 500 MG tablet       Multiple Vitamin (MULTIVITAMIN WITH MINERALS) TABS tablet Take 1 tablet by mouth daily. 30 tablet 0    Nutritional Supplements (,FEEDING SUPPLEMENT, PROSOURCE PLUS) liquid Take 30 mLs by mouth 3 (three) times daily between meals. 2700 mL 0    ondansetron (ZOFRAN) 4 MG tablet Place 1 tablet (4 mg total) into feeding tube every 6 (six) hours as needed for nausea or vomiting. 30 tablet 0    pantoprazole (PROTONIX) 40 MG tablet Take 1 tablet (40 mg total) by mouth 2 (two) times daily. 60 tablet 0    polyethylene glycol (MIRALAX / GLYCOLAX) 17 g packet Take 17 g by mouth daily as needed for severe constipation. 14 each 0    torsemide (DEMADEX) 20 MG tablet Take 1 tablet (20 mg total) by mouth daily. 30 tablet 0    venlafaxine (EFFEXOR) 50 MG tablet Take 1 tablet (50 mg total) by  mouth 2 (two) times daily. 60 tablet 0     Scheduled:   atorvastatin  40 mg Oral Daily   dexamethasone  6 mg Oral Daily   furosemide  80 mg Intravenous Once   levothyroxine  100 mcg Oral Q0600   pantoprazole  40 mg Oral BID   sodium bicarbonate  650 mg Oral TID   Infusions:   albumin human     heparin 1,400 Units/hr (01/09/21 2154)   PRN: acetaminophen, albuterol, senna-docusate  Assessment: 77 yof with a history of AF, osteomyelitis of lt foot, CKD, liver disease 2/2 NASH, nephrotic syndrome, HF. Patient is presenting with SOB. Heparin per pharmacy consult placed for atrial fibrillation.  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 Admission complicated by acute CVA w/ expressive aphasia, acute GIB, and aspiration pneumonia.   Patient is on apixaban prior to arrival. Last dose is undetermined at this time. Have ordered baseline aPTT and heparin level.  Will require aPTT monitoring due to likely falsely high anti-Xa level secondary to DOAC use.  Hgb 7.1; plt 159 - slightly below baseline  12/30 AM update:  Heparin had been off for a couple of hours per RN aPTT is 69 after the off-time  Goal of Therapy:  Heparin level 0.3-0.7 units/ml aPTT 66-102 seconds Monitor platelets by anticoagulation protocol: Yes   Plan:  Re-start heparin drip at lower rate of 1000 units/hr 1200 aPTT and heparin level  Narda Bonds, PharmD, BCPS Clinical Pharmacist Phone: [phone]

## 2021-01-11 LAB — CBC WITH DIFFERENTIAL/PLATELET
Abs Immature Granulocytes: 0.06 10*3/uL (ref 0.00–0.07)
Basophils Absolute: 0 10*3/uL (ref 0.0–0.1)
Basophils Relative: 0 %
Eosinophils Absolute: 0 10*3/uL (ref 0.0–0.5)
Eosinophils Relative: 0 %
HCT: 25.5 % — ABNORMAL LOW (ref 36.0–46.0)
Hemoglobin: 8.1 g/dL — ABNORMAL LOW (ref 12.0–15.0)
Immature Granulocytes: 1 %
Lymphocytes Relative: 11 %
Lymphs Abs: 0.8 10*3/uL (ref 0.7–4.0)
MCH: 28.5 pg (ref 26.0–34.0)
MCHC: 31.8 g/dL (ref 30.0–36.0)
MCV: 89.8 fL (ref 80.0–100.0)
Monocytes Absolute: 0.6 10*3/uL (ref 0.1–1.0)
Monocytes Relative: 8 %
Neutro Abs: 5.4 10*3/uL (ref 1.7–7.7)
Neutrophils Relative %: 80 %
Platelets: 194 10*3/uL (ref 150–400)
RBC: 2.84 MIL/uL — ABNORMAL LOW (ref 3.87–5.11)
RDW: 19.8 % — ABNORMAL HIGH (ref 11.5–15.5)
WBC: 6.8 10*3/uL (ref 4.0–10.5)
nRBC: 0 % (ref 0.0–0.2)

## 2021-01-11 LAB — COMPREHENSIVE METABOLIC PANEL
ALT: 9 U/L (ref 0–44)
AST: 15 U/L (ref 15–41)
Albumin: 3 g/dL — ABNORMAL LOW (ref 3.5–5.0)
Alkaline Phosphatase: 58 U/L (ref 38–126)
Anion gap: 16 — ABNORMAL HIGH (ref 5–15)
BUN: 95 mg/dL — ABNORMAL HIGH (ref 8–23)
CO2: 14 mmol/L — ABNORMAL LOW (ref 22–32)
Calcium: 8.8 mg/dL — ABNORMAL LOW (ref 8.9–10.3)
Chloride: 107 mmol/L (ref 98–111)
Creatinine, Ser: 6.79 mg/dL — ABNORMAL HIGH (ref 0.44–1.00)
GFR, Estimated: 6 mL/min — ABNORMAL LOW (ref >60)
Glucose, Bld: 131 mg/dL — ABNORMAL HIGH (ref 70–99)
Potassium: 4 mmol/L (ref 3.5–5.1)
Sodium: 137 mmol/L (ref 135–145)
Total Bilirubin: 0.8 mg/dL (ref 0.3–1.2)
Total Protein: 5.4 g/dL — ABNORMAL LOW (ref 6.5–8.1)

## 2021-01-11 LAB — GLUCOSE, CAPILLARY
Glucose-Capillary: 121 mg/dL — ABNORMAL HIGH (ref 70–99)
Glucose-Capillary: 139 mg/dL — ABNORMAL HIGH (ref 70–99)
Glucose-Capillary: 139 mg/dL — ABNORMAL HIGH (ref 70–99)
Glucose-Capillary: 144 mg/dL — ABNORMAL HIGH (ref 70–99)
Glucose-Capillary: 157 mg/dL — ABNORMAL HIGH (ref 70–99)
Glucose-Capillary: 164 mg/dL — ABNORMAL HIGH (ref 70–99)

## 2021-01-11 LAB — HEPARIN LEVEL (UNFRACTIONATED): Heparin Unfractionated: >1.1 IU/mL — ABNORMAL HIGH (ref 0.30–0.70)

## 2021-01-11 LAB — APTT
aPTT: 154 seconds — ABNORMAL HIGH (ref 24–36)
aPTT: 126 seconds — ABNORMAL HIGH (ref 24–36)

## 2021-01-11 LAB — MAGNESIUM: Magnesium: 2.2 mg/dL (ref 1.7–2.4)

## 2021-01-11 LAB — FERRITIN: Ferritin: 54 ng/mL (ref 11–307)

## 2021-01-11 LAB — D-DIMER, QUANTITATIVE: D-Dimer, Quant: 3 ug/mL-FEU — ABNORMAL HIGH (ref 0.00–0.50)

## 2021-01-11 LAB — PHOSPHORUS: Phosphorus: 5.5 mg/dL — ABNORMAL HIGH (ref 2.5–4.6)

## 2021-01-11 LAB — C-REACTIVE PROTEIN: CRP: 0.9 mg/dL (ref <1.0)

## 2021-01-11 MED ORDER — DILTIAZEM HCL ER COATED BEADS 180 MG PO CP24
180.0000 mg | ORAL_CAPSULE | Freq: Every day | ORAL | Status: DC
  Administered 2021-01-11 – 2021-01-24 (×14): 180 mg via ORAL
  Filled 2021-01-11 (×14): qty 1

## 2021-01-11 MED ORDER — SODIUM BICARBONATE 8.4 % IV SOLN
INTRAVENOUS | Status: AC
  Filled 2021-01-11: qty 1000

## 2021-01-11 MED ORDER — HEPARIN (PORCINE) 25000 UT/250ML-% IV SOLN
500.0000 [IU]/h | INTRAVENOUS | Status: DC
Start: 2021-01-11 — End: 2021-01-12

## 2021-01-11 MED ORDER — DILTIAZEM HCL-DEXTROSE 125-5 MG/125ML-% IV SOLN (PREMIX)
5.0000 mg/h | INTRAVENOUS | Status: DC
  Filled 2021-01-11: qty 125

## 2021-01-11 MED ORDER — DILTIAZEM LOAD VIA INFUSION
20.0000 mg | Freq: Once | INTRAVENOUS | Status: AC
  Administered 2021-01-11: 20 mg via INTRAVENOUS
  Filled 2021-01-11: qty 20

## 2021-01-11 NOTE — Progress Notes (Signed)
ANTICOAGULATION CONSULT NOTE - Follow Up Consult  Pharmacy Consult for heparin Indication: atrial fibrillation  Allergies  Allergen Reactions   Codeine Nausea And Vomiting    Patient Measurements: Height: 5\' 7"  (170.2 cm) Weight: 89.4 kg (197 lb 1.5 oz) IBW/kg (Calculated) : 61.6 Heparin Dosing Weight: 79.6 kg  Vital Signs: Temp: 98.6 F (37 C) (12/31 2000) Temp Source: Oral (12/31 2000) BP: 135/83 (12/31 2000) Pulse Rate: 101 (12/31 2000)  Labs: Recent Labs    01/09/21 1242 01/09/21 1326 01/09/21 1336 01/09/21 1523 01/09/21 1814 01/09/21 2242 01/10/21 0258 01/10/21 0629 01/10/21 1622 01/10/21 1842 01/11/21 0635 01/11/21 1838  HGB 7.4*  --    < >  --   --   --   --  6.7* 8.3*  --  8.1*  --   HCT 24.2*  --    < >  --   --   --   --  21.3* 25.2*  --  25.5*  --   PLT 159  --   --   --   --   --   --  132*  --   --  194  --   APTT  --   --   --   --   --  >200*   < >  --  >200* >200* 154* 126*  LABPROT  --   --   --   --   --  34.4*  --   --   --   --   --   --   INR  --   --   --   --   --  3.4*  --   --   --   --   --   --   HEPARINUNFRC  --   --   --   --  >1.10*  --   --   --   --   --  >1.10*  --   CREATININE 6.31*  --   --   --   --   --   --  6.65*  --   --  6.79*  --   TROPONINIHS  --  17  --  17  --   --   --   --   --   --   --   --    < > = values in this interval not displayed.     Estimated Creatinine Clearance: 9.1 mL/min (A) (by C-G formula based on SCr of 6.79 mg/dL (H)).  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 Patient is presenting with SOB. Heparin per pharmacy consult placed for atrial fibrillation.   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 Admission complicated by acute CVA w/ expressive aphasia, acute GIB, and aspiration pneumonia.    Patient  is on apixaban prior to arrival. Last dose is undetermined at this time. Will require aPTT monitoring due to likely falsely high anti-Xa level secondary to DOAC use.  Tonight's APTT continues to be supratherapeutic at 126 seconds  after holding heparin drip x1 hour  and restarting heparin at reduced rate of 550 units/hr this AM. No bleeding or issues with infusion reported.  Hgb up to 8.1 s/p 1 unit PRBC. PLTC 194k.  Goal of Therapy:  Heparin level 0.3-0.7 units/ml aPTT 66-102 seconds Monitor  platelets by anticoagulation protocol: Yes   Plan:  Decrease heparin infusion to 400 units/hr Check APTT in ~8h  @0500  Daily heparin level and APTT until correlating Daily CBC and monitor for s/sx of bleeding   Thank you for including pharmacy in the care of this patient.  Nicole Cella, RPh Clinical Pharmacist 01/11/2021  8:35 PM  Please check [email or web address] for unit-specific pharmacy phone numbers.

## 2021-01-11 NOTE — Progress Notes (Signed)
Nephrology Follow-Up Consult note   Assessment/Recommendations: Autumn [name] is a/an 70 y.o. female with a past medical history significant for DM2, HTN, hypothyroidism, NASH cirrhosis, pAfib, CVA, admitted for COVID pneumonia c/b AKI.     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  Termed as being proteinuric kidney disease.  UA does show some protein but not extensive.  CKD has been progressive also with recent AKI during hospitalization for osteomyelitis.  Baseline creatinine around 3.6-4 at this time.  Now with AKI likely secondary to ATN associated with COVID.  Creatinine slightly worse today not fully unexpected.  Respiratory status has improved.   -Continue with discussions regarding dialysis.  May need to use during this hospitalization but no acute indication at this time. -Try gentle hydration with sodium bicarb as below.  Monitor respiratory status closely.  Could consider IV diuretics if needed -Continue to monitor daily Cr, Dose meds for GFR -Monitor Daily I/Os, Daily weight  -Maintain MAP>65 for optimal renal perfusion.  -Avoid nephrotoxic medications including NSAIDs and Vanc/Zosyn combo  Acute hypoxic respiratory failure secondary to COVID-pneumonia: Improved today.  Continue management per primary team.    Anemia: Likely multifactorial with acute illness, chronic inflammation, CKD contributing.  Hemoglobin has improved after transfusion yesterday.  Continue to monitor  Metabolic acidosis: Mixed anion gap and non-anion gap likely secondary to acute kidney injury.  continue sodium bicarb to 1300 mg 3 times daily.  Add IV sodium bicarb 100 cc/h for 10 hours.  Monitor respiratory status closely  UTI: Pyuria on urinalysis.  Minimal symptoms were because of AKI we will treat.  Ceftriaxone 1 g x 5 days.    Atrial fibrillation: Management per primary Left foot  wound: Status post amputation.  Management per primary Diabetes Mellitus Type 2 with Hyperglycemia: Management per primary    Recommendations conveyed to primary service.    [facility] 01/11/2021 9:15 AM  ___________________________________________________________  CC: COVID pneumonia, AKI  Interval History/Subjective: Patient states she feels okay today.  Denies significant shortness of breath.  Urine output seems slightly improved with 850 cc produced yesterday.  Creatinine slightly higher at 6.8 today.  No acute indication for dialysis.  Discussed dialysis again today and the patient is still contemplative.   Medications:  Current Facility-Administered Medications  Medication Dose Route Frequency Provider Last Rate Last Admin   acetaminophen (TYLENOL) tablet 650 mg  650 mg Oral Q6H PRN Sanjuan Dame, MD       albuterol (PROVENTIL) (2.5 MG/3ML) 0.083% nebulizer solution 3 mL  3 mL Inhalation Q4H PRN Sanjuan Dame, MD       atorvastatin (LIPITOR) tablet 40 mg  40 mg Oral Daily Sanjuan Dame, MD   40 mg at 01/10/21 6546   cefTRIAXone (ROCEPHIN) 1 g in sodium chloride 0.9 % 100 mL IVPB  1 g Intravenous Q24H Reesa Chew, MD 200 mL/hr at 01/10/21 1223 1 g at 01/10/21 1223   Chlorhexidine Gluconate Cloth 2 % PADS 6 each  6 each Topical Daily Sid Falcon, MD   6 each at 01/10/21 1522   dexamethasone (DECADRON) tablet 6 mg  6 mg Oral Daily Sanjuan Dame, MD   6 mg at 01/10/21 5035   diltiazem (CARDIZEM) 125 mg in dextrose 5% 125 mL (1 mg/mL) infusion  5-15 mg/hr Intravenous Titrated Harvie Heck, MD 5 mL/hr at 01/11/21 0833 5 mg/hr at 01/11/21 [phone]  feeding supplement (ENSURE ENLIVE / ENSURE PLUS) liquid 237 mL  237 mL Oral TID BM Gilles Chiquito B, MD   237 mL at 01/10/21 2023   heparin ADULT infusion 100 units/mL (25000 units/274mL)  550 Units/hr Intravenous Continuous Beldon, Brianna S, RPH       insulin aspart (novoLOG) injection  0-15 Units  0-15 Units Subcutaneous Q4H Sanjuan Dame, MD   2 Units at 01/11/21 4403   levothyroxine (SYNTHROID) tablet 100 mcg  100 mcg Oral Q0600 Sanjuan Dame, MD   100 mcg at 01/11/21 4742   multivitamin with minerals tablet 1 tablet  1 tablet Oral Daily Gilles Chiquito B, MD   1 tablet at 01/10/21 1223   pantoprazole (PROTONIX) EC tablet 40 mg  40 mg Oral BID Sanjuan Dame, MD   40 mg at 01/10/21 2136   senna-docusate (Senokot-S) tablet 1 tablet  1 tablet Oral QHS PRN Sanjuan Dame, MD       sodium bicarbonate 150 mEq in dextrose 5 % 1,150 mL infusion   Intravenous Continuous Reesa Chew, MD       sodium bicarbonate tablet 1,300 mg  1,300 mg Oral TID Reesa Chew, MD   1,300 mg at 01/10/21 2136      Review of Systems: 10 systems reviewed and negative except per interval history/subjective  Physical Exam: Vitals:   01/11/21 0500 01/11/21 0752  BP: (!) 138/94 (!) 138/99  Pulse: (!) 134 (!) 122  Resp: (!) 23 19  Temp:  97.6 F (36.4 C)  SpO2: 93% 95%   No intake/output data recorded.  Intake/Output Summary (Last 24 hours) at 01/11/2021 0915 Last data filed at 01/11/2021 0500 Gross per 24 hour  Intake 728.5 ml  Output 850 ml  Net -121.5 ml   Constitutional: Chronically ill-appearing, lying in bed, no distress ENMT: ears and nose without scars or lesions, dry mucous membranes CV: normal rate, no edema Respiratory: Bilateral chest rise with no increased work of breathing Gastrointestinal: soft, non-tender, no palpable masses or hernias Skin: no visible lesions or rashes Psych: alert, judgement/insight appropriate, appropriate mood and affect   Test Results I personally reviewed new and old clinical labs and radiology tests Lab Results  Component Value Date   NA 137 01/11/2021   K 4.0 01/11/2021   CL 107 01/11/2021   CO2 14 (L) 01/11/2021   BUN 95 (H) 01/11/2021   CREATININE 6.79 (H) 01/11/2021   CALCIUM 8.8 (L) 01/11/2021   ALBUMIN 3.0 (L)  01/11/2021   PHOS 5.5 (H) 01/11/2021

## 2021-01-11 NOTE — Progress Notes (Signed)
ANTICOAGULATION CONSULT NOTE - Follow Up Consult  Pharmacy Consult for heparin Indication: atrial fibrillation  Allergies  Allergen Reactions   Codeine Nausea And Vomiting    Patient Measurements: Height: 5\' 7"  (170.2 cm) Weight: 89.4 kg (197 lb 1.5 oz) IBW/kg (Calculated) : 61.6 Heparin Dosing Weight: 79.6 kg  Vital Signs: Temp: 97.6 F (36.4 C) (12/31 0752) Temp Source: Oral (12/31 0752) BP: 138/99 (12/31 0752) Pulse Rate: 122 (12/31 0752)  Labs: Recent Labs    01/09/21 1242 01/09/21 1326 01/09/21 1336 01/09/21 1523 01/09/21 1814 01/09/21 2242 01/10/21 0258 01/10/21 0629 01/10/21 1622 01/10/21 1842 01/11/21 0635  HGB 7.4*  --    < >  --   --   --   --  6.7* 8.3*  --  8.1*  HCT 24.2*  --    < >  --   --   --   --  21.3* 25.2*  --  25.5*  PLT 159  --   --   --   --   --   --  132*  --   --  194  APTT  --   --   --   --   --  >200*   < >  --  >200* >200* 154*  LABPROT  --   --   --   --   --  34.4*  --   --   --   --   --   INR  --   --   --   --   --  3.4*  --   --   --   --   --   HEPARINUNFRC  --   --   --   --  >1.10*  --   --   --   --   --  >1.10*  CREATININE 6.31*  --   --   --   --   --   --  6.65*  --   --  6.79*  TROPONINIHS  --  17  --  17  --   --   --   --   --   --   --    < > = values in this interval not displayed.    Estimated Creatinine Clearance: 9.1 mL/min (A) (by C-G formula based on SCr of 6.79 mg/dL (H)).  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 Patient is presenting with SOB. Heparin per pharmacy consult placed for atrial fibrillation.   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 Admission complicated by acute CVA w/ expressive aphasia, acute GIB, and aspiration pneumonia.    Patient is on apixaban prior to arrival. Last dose is undetermined  at this time. Will require aPTT monitoring due to likely falsely high anti-Xa level secondary to DOAC use.  Most recent APTT continues to be supratherapeutic at 154. No bleeding or issues with infusion reported, Hgb up to 8.1 s/p 1 unit PRBC.  Goal of Therapy:  Heparin level 0.3-0.7 units/ml aPTT 66-102 seconds Monitor platelets by anticoagulation protocol: Yes   Plan:  Hold heparin infusion for 1 hour (spoke with RN) Resume heparin infusion at 550 units/hr Check APTT in ~8h Daily heparin level and APTT until correlating Daily CBC and monitor for s/sx of bleeding  Thank you for including pharmacy in  the care of this patient.  Zenaida Deed, PharmD PGY1 Acute Care Pharmacy Resident  Phone: [phone] 01/11/2021  8:56 AM  Please check [email or web address] for unit-specific pharmacy phone numbers.

## 2021-01-11 NOTE — Progress Notes (Signed)
HD#2 Subjective:  Overnight Events: NAEO   Patient says that yesterday was very tiring. She thinks that her breathing is doing alright today. No burning at her catheter site. Has not yet started her breakfast but has it in front of her.  Objective:  Vital signs in last 24 hours: Vitals:   01/10/21 1400 01/10/21 1540 01/11/21 0500 01/11/21 0550  BP: (!) 141/89 136/89 (!) 138/94   Pulse: (!) 120 (!) 133 (!) 134   Resp: 15 18 (!) 23   Temp: 98.7 F (37.1 C) 98.3 F (36.8 C)    TempSrc: Oral Oral    SpO2: 98% 97% 93%   Weight:    89.4 kg  Height:       Supplemental O2: Nasal Cannula SpO2: 93 % O2 Flow Rate (L/min): 2 L/min FiO2 (%): 40 %   Physical Exam:  Gen: elderly female resting comfortably in bed CV: irregularly irregular rhythm, 3+ pitting edema of the arms, legs, and sacrum Lungs: Coarse breath sounds bilaterally, normal WOB on 2L New Site GI: soft, nontender, nondistended GU: foley in place draining clear yellow urine Neuro: alert, oriented to self, year, and place, slow to answer questions/difficulty concentrating. Not able to state the president Skin: warm and dry Psych: normal affect  Filed Weights   01/09/21 2151 01/10/21 0613 01/11/21 0550  Weight: 85.6 kg 85.1 kg 89.4 kg     Intake/Output Summary (Last 24 hours) at 01/11/2021 0659 Last data filed at 01/11/2021 0500 Gross per 24 hour  Intake 728.5 ml  Output 850 ml  Net -121.5 ml   Net IO Since Admission: 78.5 mL [01/11/21 0659]  Pertinent Labs: CBC Latest Ref Rng & Units 01/10/2021 01/10/2021 01/09/2021  WBC 4.0 - 10.5 K/uL - 2.7(L) -  Hemoglobin 12.0 - 15.0 g/dL 8.3(L) 6.7(LL) 7.1(L)  Hematocrit 36.0 - 46.0 % 25.2(L) 21.3(L) 21.0(L)  Platelets 150 - 400 K/uL - 132(L) -    CMP Latest Ref Rng & Units 01/10/2021 01/09/2021 01/09/2021  Glucose 70 - 99 mg/dL 112(H) - -  BUN 8 - 23 mg/dL 85(H) - -  Creatinine 0.44 - 1.00 mg/dL 6.65(H) - -  Sodium 135 - 145 mmol/L 139 136 -  Potassium 3.5 - 5.1  mmol/L 4.0 3.8 -  Chloride 98 - 111 mmol/L 108 - -  CO2 22 - 32 mmol/L 14(L) - -  Calcium 8.9 - 10.3 mg/dL 8.8(L) - -  Total Protein 6.5 - 8.1 g/dL 5.4(L) - 5.2(L)  Total Bilirubin 0.3 - 1.2 mg/dL 0.8 - 0.7  Alkaline Phos 38 - 126 U/L 54 - 66  AST 15 - 41 U/L 14(L) - 17  ALT 0 - 44 U/L 9 - 9    Imaging: No results found.  Assessment/Plan:   Principal Problem:   COVID-19   Patient Summary: Autumn [name] is a 70 y.o. 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 Nephro recommended holding further diuresis yesterday. Weight is slightly down at 89.4 kg from 85.1. Will follow up on nephro's recommendations today. No urgent need for dialysis but she is approaching it.  - nephro consult, appreciate assistance - strict I/O; foley - avoid nephrotoxic medications  #UTI Pyuria seen on UA, per nephrology  will plan for ceftriaxone 1 g for 5 days. Denies dysuria at this time. - continue ceftriaxone  #Acute hypoxic respiratory failure due to COVID-19 pneumonia  Patient is able to speak in full sentences and has normal work of breathing on the 2L [place]. She does not seem to be in any respiratory distress and she is not coughing. Suspect that acute COVID may be worsening underlying renal disease. - telemetry - inflammatory markers improving - continuous pulse ox - decadron 6 mg daily - f/u Bcx; NGTD at 24 hours   #Chronic diastolic heart failure #Paroxysmal atrial fibrillation Patient went into afib with RVR yesterday with rates in the 130-140s. She was given 50 of metoprolol but is still tachycardic. Started dilt drip to try and  get better rate control. HR has since improved to the 100s. Will CTM - heparin per pharmacy - dilt infusion - holding home metoprolol 50 and torsemide - continue lipitor 40   #Type II diabetes mellitus Last A1c was 6.3 in October, repeat today 4.2 likely artificially low 2/2 CKD and anemia - takes victoza at [facility] - ssi  #combined Iron deficiency anemia and anemia of chronic disease Received 1 u pRBC on 12/30 and responded appropriately. May need EPO/iron in the future. Total iron deficit about 1500 prior to transfusion. - daily CBC - transfuse <7   # L Foot Wound Wound vac replaced by wound care. Will remove vac dressing upon discharge to have facility replace vac dressing after transfer. -consult to wound care   #Prior CVA Had expressive aphasia and dysphagia. LDL was 50 at prior hospitalization. - continue heparin and atorvastatin as above. -  dysphagia 3 diet once able   #Hypertension Started dilt as above. - holding metoprolol   #Hypothyroidism  - Continue levothyroxine   #Gout - holding allopurinol 2/2 AKI   #Cirrhosis 2/2 NASH - CTM   Anxiety depression -  holding venlafazine in setting of AKI -  hold home xanax  [name], MD Internal Medicine Resident PGY-1 Pager [phone] Please contact the on call pager after 5 pm and on weekends at [phone].

## 2021-01-12 ENCOUNTER — Encounter (HOSPITAL_COMMUNITY): Payer: Self-pay | Admitting: Internal Medicine

## 2021-01-12 DIAGNOSIS — U071 COVID-19: Secondary | ICD-10-CM | POA: Diagnosis not present

## 2021-01-12 DIAGNOSIS — N179 Acute kidney failure, unspecified: Secondary | ICD-10-CM | POA: Diagnosis not present

## 2021-01-12 LAB — D-DIMER, QUANTITATIVE: D-Dimer, Quant: 2.99 ug/mL-FEU — ABNORMAL HIGH (ref 0.00–0.50)

## 2021-01-12 LAB — CBC
HCT: 23.3 % — ABNORMAL LOW (ref 36.0–46.0)
Hemoglobin: 7.7 g/dL — ABNORMAL LOW (ref 12.0–15.0)
MCH: 29.5 pg (ref 26.0–34.0)
MCHC: 33 g/dL (ref 30.0–36.0)
MCV: 89.3 fL (ref 80.0–100.0)
Platelets: 127 10*3/uL — ABNORMAL LOW (ref 150–400)
RBC: 2.61 MIL/uL — ABNORMAL LOW (ref 3.87–5.11)
RDW: 18.7 % — ABNORMAL HIGH (ref 11.5–15.5)
WBC: 7 10*3/uL (ref 4.0–10.5)
nRBC: 0 % (ref 0.0–0.2)

## 2021-01-12 LAB — GLUCOSE, CAPILLARY
Glucose-Capillary: 113 mg/dL — ABNORMAL HIGH (ref 70–99)
Glucose-Capillary: 117 mg/dL — ABNORMAL HIGH (ref 70–99)
Glucose-Capillary: 119 mg/dL — ABNORMAL HIGH (ref 70–99)
Glucose-Capillary: 134 mg/dL — ABNORMAL HIGH (ref 70–99)
Glucose-Capillary: 134 mg/dL — ABNORMAL HIGH (ref 70–99)
Glucose-Capillary: 141 mg/dL — ABNORMAL HIGH (ref 70–99)

## 2021-01-12 LAB — COMPREHENSIVE METABOLIC PANEL
ALT: 9 U/L (ref 0–44)
AST: 16 U/L (ref 15–41)
Albumin: 2.8 g/dL — ABNORMAL LOW (ref 3.5–5.0)
Alkaline Phosphatase: 52 U/L (ref 38–126)
Anion gap: 11 (ref 5–15)
BUN: 96 mg/dL — ABNORMAL HIGH (ref 8–23)
CO2: 19 mmol/L — ABNORMAL LOW (ref 22–32)
Calcium: 8.4 mg/dL — ABNORMAL LOW (ref 8.9–10.3)
Chloride: 108 mmol/L (ref 98–111)
Creatinine, Ser: 6.98 mg/dL — ABNORMAL HIGH (ref 0.44–1.00)
GFR, Estimated: 6 mL/min — ABNORMAL LOW (ref >60)
Glucose, Bld: 117 mg/dL — ABNORMAL HIGH (ref 70–99)
Potassium: 3.8 mmol/L (ref 3.5–5.1)
Sodium: 138 mmol/L (ref 135–145)
Total Bilirubin: 0.6 mg/dL (ref 0.3–1.2)
Total Protein: 5.2 g/dL — ABNORMAL LOW (ref 6.5–8.1)

## 2021-01-12 LAB — CBC WITH DIFFERENTIAL/PLATELET
Abs Immature Granulocytes: 0.05 10*3/uL (ref 0.00–0.07)
Basophils Absolute: 0 10*3/uL (ref 0.0–0.1)
Basophils Relative: 0 %
Eosinophils Absolute: 0 10*3/uL (ref 0.0–0.5)
Eosinophils Relative: 0 %
HCT: 23.6 % — ABNORMAL LOW (ref 36.0–46.0)
Hemoglobin: 7.4 g/dL — ABNORMAL LOW (ref 12.0–15.0)
Immature Granulocytes: 1 %
Lymphocytes Relative: 13 %
Lymphs Abs: 0.6 10*3/uL — ABNORMAL LOW (ref 0.7–4.0)
MCH: 28.1 pg (ref 26.0–34.0)
MCHC: 31.4 g/dL (ref 30.0–36.0)
MCV: 89.7 fL (ref 80.0–100.0)
Monocytes Absolute: 0.2 10*3/uL (ref 0.1–1.0)
Monocytes Relative: 6 %
Neutro Abs: 3.5 10*3/uL (ref 1.7–7.7)
Neutrophils Relative %: 80 %
Platelets: UNDETERMINED 10*3/uL (ref 150–400)
RBC: 2.63 MIL/uL — ABNORMAL LOW (ref 3.87–5.11)
RDW: 18.9 % — ABNORMAL HIGH (ref 11.5–15.5)
WBC: 4.4 10*3/uL (ref 4.0–10.5)
nRBC: 0 % (ref 0.0–0.2)

## 2021-01-12 LAB — C-REACTIVE PROTEIN: CRP: 0.6 mg/dL (ref <1.0)

## 2021-01-12 LAB — IRON AND TIBC
Iron: 102 ug/dL (ref 28–170)
Saturation Ratios: 46 % — ABNORMAL HIGH (ref 10.4–31.8)
TIBC: 221 ug/dL — ABNORMAL LOW (ref 250–450)
UIBC: 119 ug/dL

## 2021-01-12 LAB — PREPARE RBC (CROSSMATCH)

## 2021-01-12 LAB — FERRITIN: Ferritin: 71 ng/mL (ref 11–307)

## 2021-01-12 LAB — HEPARIN LEVEL (UNFRACTIONATED): Heparin Unfractionated: >1.1 IU/mL — ABNORMAL HIGH (ref 0.30–0.70)

## 2021-01-12 LAB — PHOSPHORUS: Phosphorus: 4.9 mg/dL — ABNORMAL HIGH (ref 2.5–4.6)

## 2021-01-12 LAB — APTT
aPTT: 56 seconds — ABNORMAL HIGH (ref 24–36)
aPTT: 39 seconds — ABNORMAL HIGH (ref 24–36)

## 2021-01-12 LAB — MAGNESIUM: Magnesium: 2.3 mg/dL (ref 1.7–2.4)

## 2021-01-12 MED ORDER — SODIUM CHLORIDE 0.9% IV SOLUTION: Freq: Once | INTRAVENOUS | Status: AC

## 2021-01-12 MED ORDER — METOPROLOL TARTRATE 50 MG PO TABS
50.0000 mg | ORAL_TABLET | Freq: Two times a day (BID) | ORAL | Status: DC
  Administered 2021-01-12 – 2021-01-24 (×25): 50 mg via ORAL
  Filled 2021-01-12 (×26): qty 1

## 2021-01-12 NOTE — Progress Notes (Signed)
ANTICOAGULATION CONSULT NOTE - Follow Up Consult  Pharmacy Consult for heparin Indication: atrial fibrillation  Allergies  Allergen Reactions   Codeine Nausea And Vomiting    Patient Measurements: Height: 5\' 7"  (170.2 cm) Weight: 87.5 kg (192 lb 14.4 oz) IBW/kg (Calculated) : 61.6 Heparin Dosing Weight: 79.6 kg  Vital Signs: Temp: 97.7 F (36.5 C) (01/01 1149) Temp Source: Oral (01/01 1149) BP: 146/97 (01/01 1500) Pulse Rate: 100 (01/01 1500)  Labs: Recent Labs    01/09/21 1814 01/09/21 2242 01/10/21 0258 01/10/21 0629 01/10/21 1622 01/10/21 1842 01/11/21 0635 01/11/21 1838 01/12/21 0610 01/12/21 1542  HGB  --   --    < > 6.7* 8.3*  --  8.1*  --  7.4*  --   HCT  --   --    < > 21.3* 25.2*  --  25.5*  --  23.6*  --   PLT  --   --   --  132*  --   --  194  --  PLATELET CLUMPS NOTED ON SMEAR, UNABLE TO ESTIMATE  --   APTT  --  >200*   < >  --  >200*   < > 154* 126* 56* 39*  LABPROT  --  34.4*  --   --   --   --   --   --   --   --   INR  --  3.4*  --   --   --   --   --   --   --   --   HEPARINUNFRC >1.10*  --   --   --   --   --  >1.10*  --  >1.10*  --   CREATININE  --   --   --  6.65*  --   --  6.79*  --  6.98*  --    < > = values in this interval not displayed.     Estimated Creatinine Clearance: 8.8 mL/min (A) (by C-G formula based on SCr of 6.98 mg/dL (H)).  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 Patient is presenting with SOB. Heparin per pharmacy consult placed for atrial fibrillation.   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 Admission complicated by acute CVA w/ expressive aphasia, acute GIB, and aspiration pneumonia.    Patient is on apixaban prior to arrival. Last dose is undetermined at this time.   Most recent APTT is now subtherapeutic at 39.  Nursing reports bleeding in stools and MD has ordered to hold heparin at this time.Will continue to follow for plans to restart heparin after bleeding resolves.  Goal of Therapy:  Heparin level 0.3-0.7 units/ml aPTT 66-102 seconds Monitor platelets by anticoagulation protocol: Yes   Plan:  Hold heparin per MD Daily CBC  Thank you for allowing pharmacy to participate in this patient's care.  Reatha Harps, PharmD PGY1 Pharmacy Resident 01/12/2021 4:49 PM Check [email or web address] for unit specific pharmacy number

## 2021-01-12 NOTE — Progress Notes (Signed)
HD#3 Subjective:  Overnight Events: NAEO   Patient feeling ok this morning. Still a little cold. Not having any trouble breathing or pain with urination. Knows she is in the hospital.  Objective:  Vital signs in last 24 hours: Vitals:   01/11/21 2000 01/12/21 0030 01/12/21 0330 01/12/21 0459  BP: 135/83 128/88 (!) 145/89   Pulse: (!) 101 (!) 105 (!) 110   Resp: 16 16 18    Temp: 98.6 F (37 C) 98.6 F (37 C) 98.7 F (37.1 C)   TempSrc: Oral Axillary Axillary   SpO2: 97% 98% 98%   Weight:    87.5 kg  Height:       Supplemental O2:  CPAP SpO2: 98 % O2 Flow Rate (L/min): 2 L/min FiO2 (%): 40 %   Physical Exam:  Gen: elderly female resting comfortably in bed wearing CPAP CV: irregularly irregular rhythm, tachycardic, 3+ pitting edema of the arms, legs, and sacrum Lungs: Coarse breath sounds bilaterally, normal WOB on 2L via CPAP GI: soft, nontender, nondistended GU: foley in place draining clear yellow urine Neuro: alert, oriented to self, year, and place Skin: warm and dry Psych: normal affect  Filed Weights   01/10/21 0613 01/11/21 0550 01/12/21 0459  Weight: 85.1 kg 89.4 kg 87.5 kg     Intake/Output Summary (Last 24 hours) at 01/12/2021 0752 Last data filed at 01/11/2021 2142 Gross per 24 hour  Intake 661.63 ml  Output 120 ml  Net 541.63 ml   Net IO Since Admission: 620.13 mL [01/12/21 0752]  Pertinent Labs: CBC Latest Ref Rng & Units 01/12/2021 01/11/2021 01/10/2021  WBC 4.0 - 10.5 K/uL 4.4 6.8 -  Hemoglobin 12.0 - 15.0 g/dL 7.4(L) 8.1(L) 8.3(L)  Hematocrit 36.0 - 46.0 % 23.6(L) 25.5(L) 25.2(L)  Platelets 150 - 400 K/uL PENDING 194 -    CMP Latest Ref Rng & Units 01/11/2021 01/10/2021 01/09/2021  Glucose 70 - 99 mg/dL 131(H) 112(H) -  BUN 8 - 23 mg/dL 95(H) 85(H) -  Creatinine 0.44 - 1.00 mg/dL 6.79(H) 6.65(H) -  Sodium 135 - 145 mmol/L 137 139 136  Potassium 3.5 - 5.1 mmol/L 4.0 4.0 3.8  Chloride 98 - 111 mmol/L 107 108 -  CO2 22 - 32 mmol/L 14(L)  14(L) -  Calcium 8.9 - 10.3 mg/dL 8.8(L) 8.8(L) -  Total Protein 6.5 - 8.1 g/dL 5.4(L) 5.4(L) -  Total Bilirubin 0.3 - 1.2 mg/dL 0.8 0.8 -  Alkaline Phos 38 - 126 U/L 58 54 -  AST 15 - 41 U/L 15 14(L) -  ALT 0 - 44 U/L 9 9 -    Imaging: No results found.  Assessment/Plan:   Principal Problem:   COVID-19   Patient Summary: Autumn [name] is a 71 y.o. 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 Received IV sodium bicarb yesterday for acidosis. Poor urine output with only 120 documented. Weight up to 87.5 kg. Creatinine still continues to rise, 6.98 today. Per nephro's note if improvement is not seen in next 1-2 days HD may be needed. - nephro consult, appreciate assistance - strict I/O; foley - avoid nephrotoxic medications   #UTI Pyuria seen on UA, per nephrology will plan for ceftriaxone 1 g  for 5 days. Denies dysuria at this time. - continue ceftriaxone, end date 1/3   #Acute hypoxic respiratory failure due to COVID-19 pneumonia  Patient is able to speak in full sentences and has normal work of breathing on the 2L. She does not seem to be in any respiratory distress and she is not coughing. Suspect that acute COVID may be worsening underlying renal disease. - telemetry - inflammatory markers improving - continuous pulse ox - decadron 6 mg daily - f/u Bcx; NGTD at 48 hours   #Chronic diastolic heart failure #Paroxysmal atrial fibrillation Patient back on her home dose of diltiazem, restarting home metoprolol today. HR improved overnight to the 100-110s though slightly elevated this morning prior to metoprolol dosing.  - heparin  per pharmacy - continue home diltiazem, restart metoprolol - holding home torsemide - continue lipitor 40   #Type II diabetes mellitus Last A1c was 6.3 in October, repeat today 4.2 likely artificially low 2/2 CKD and anemia - takes victoza at [facility] - ssi  #combined Iron deficiency anemia and anemia of chronic disease Received 1 u pRBC on 12/30 and responded appropriately. May need EPO/iron in the future. Total iron deficit about 1500 prior to transfusion. Hemoglobin slightly down today to 7.4. - daily CBC - transfuse <7   # L Foot Wound Wound vac replaced by wound care. Will remove vac dressing upon discharge to have facility replace vac dressing after transfer. -consult to wound care   #Prior CVA Had expressive aphasia and dysphagia. LDL was 50 at prior hospitalization. - continue heparin and atorvastatin as above. -  dysphagia 3 diet once able   #Hypertension Started dilt as above. - holding metoprolol   #Hypothyroidism  - Continue levothyroxine   #Gout - holding allopurinol 2/2 AKI   #Cirrhosis 2/2 NASH - CTM   Anxiety depression -  holding venlafazine in setting of AKI -  hold home xanax   [name], MD Internal Medicine Resident PGY-1 Pager [phone] Please contact the on call pager after 5 pm and on weekends at [phone].

## 2021-01-12 NOTE — Progress Notes (Signed)
ANTICOAGULATION CONSULT NOTE - Follow Up Consult  Pharmacy Consult for heparin Indication: atrial fibrillation  Allergies  Allergen Reactions   Codeine Nausea And Vomiting    Patient Measurements: Height: 5\' 7"  (170.2 cm) Weight: 87.5 kg (192 lb 14.4 oz) IBW/kg (Calculated) : 61.6 Heparin Dosing Weight: 79.6 kg  Vital Signs: Temp: 98.7 F (37.1 C) (01/01 0330) Temp Source: Axillary (01/01 0330) BP: 143/117 (01/01 0852) Pulse Rate: 100 (01/01 0852)  Labs: Recent Labs    01/09/21 1326 01/09/21 1336 01/09/21 1523 01/09/21 1814 01/09/21 2242 01/10/21 0258 01/10/21 0629 01/10/21 1622 01/10/21 1842 01/11/21 0635 01/11/21 1838 01/12/21 0610  HGB  --    < >  --   --   --   --  6.7* 8.3*  --  8.1*  --  7.4*  HCT  --    < >  --   --   --   --  21.3* 25.2*  --  25.5*  --  23.6*  PLT  --   --   --   --   --   --  132*  --   --  194  --  PLATELET CLUMPS NOTED ON SMEAR, UNABLE TO ESTIMATE  APTT  --   --   --   --  >200*   < >  --  >200*   < > 154* 126* 56*  LABPROT  --   --   --   --  34.4*  --   --   --   --   --   --   --   INR  --   --   --   --  3.4*  --   --   --   --   --   --   --   HEPARINUNFRC  --   --   --  >1.10*  --   --   --   --   --  >1.10*  --  >1.10*  CREATININE  --   --   --   --   --   --  6.65*  --   --  6.79*  --  6.98*  TROPONINIHS 17  --  17  --   --   --   --   --   --   --   --   --    < > = values in this interval not displayed.     Estimated Creatinine Clearance: 8.8 mL/min (A) (by C-G formula based on SCr of 6.98 mg/dL (H)).  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 Patient is presenting with SOB. Heparin per pharmacy consult placed for atrial fibrillation.   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 Admission complicated by acute CVA w/ expressive  aphasia, acute GIB, and aspiration pneumonia.    Patient is on apixaban prior to arrival. Last dose is undetermined at this time. Will require aPTT monitoring due to likely falsely high anti-Xa level secondary to DOAC use.  Most recent APTT is now subtherapeutic at 56. No bleeding or issues with infusion noted per chart review, Hgb 7.4.  Goal of Therapy:  Heparin level 0.3-0.7 units/ml aPTT 66-102 seconds Monitor platelets by anticoagulation protocol: Yes   Plan:  Increase to heparin 500 units/hr - conservative increase due to patient's  sensitivity to changes in heparin rate Check APTT in ~8h Daily heparin level and APTT until correlating Daily CBC and monitor for s/sx of bleeding  Thank you for including pharmacy in the care of this patient.  Zenaida Deed, PharmD PGY1 Acute Care Pharmacy Resident  Phone: [phone] 01/12/2021  8:52 AM  Please check [email or web address] for unit-specific pharmacy phone numbers.

## 2021-01-12 NOTE — Progress Notes (Signed)
PT Cancellation Note  Patient Details Name: Autumn [name] MRN: [id number] DOB: [birth year]   Cancelled Treatment:    Reason Eval/Treat Not Completed: Other (comment)  Orders received, chart reviewed;  Contacted pt's RN, who informed me that she just had a large, bloody BM, and requested we hold PT for today;   Will follow up tomorrow,   Roney Marion, [place] Pager [phone] Office [place] 01/12/2021, 4:00 PM

## 2021-01-12 NOTE — Progress Notes (Signed)
Pt stated she doesn't want to wear CPAP for the night.  

## 2021-01-12 NOTE — Progress Notes (Signed)
Paged by RN for large bloody BM. Black/burgundy in color with clots. Evaluated patient at bedside. Patient reported no dizziness or lightheadedness, no CP or SOB. However, she has not attempted to exert herself since BM. Did endorse some abdominal pain. No other symptoms.  On physical exam: Blood pressure stable. She is tachycardic with HR ~110.  Patient is alert and oriented. Does not appear to be in acute distress.  Cardiac: tachycardic 110, regular rhythm Lungs: normal work of breathing Abdominal: soft, tenderness to palpation epigastric region   Assessment:  GIB in the setting of Heparin administration with known gastritis, hemorrhoids, and diverticuli seen on endoscopy and colonoscopy from Nov 2022. Tachycardic, but hemodynamically stable and asymptomatic otherwise. Plan: Given patient's previous hgb of 7.4 prior to bloody BM, as well as her tachycardia to 110, will plan to type and screen, transfuse 1 unit PRBCs and check follow up CBC.  Holding heparin for now, this can likely be resumed if she remains hemodynamically stable and has no further episodes of bleeding.

## 2021-01-12 NOTE — Hospital Course (Addendum)
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 On initial exam she was diffusely edematous and CXR on admission was concerning for some pulmonary edema. Her creatinine was baseline was around 3.6-3.0 and she was noted to have CKD IV. However on admission, her Cr was 6.31. Nephrology was consulted and she was given lasix and albumin together which increased urine output slightly but not significantly. After this, she was also given oral and IV sodium bicarb to try and help her acidosis. Despite these measures, creatinine did not show any improvement and slowly worsened during her hospitalization. Patient had R IJ tunneled cath placed on 1/3 for temporary dialysis access. Vascular surgery was consulted for permanent access placement and patient was scheduled to have this done on 1/3 as well, however, this was delayed in the setting of ongoing GI bleeding. Bleeding resolved and patient had right AV fistula placed on 1/4. She received her first dialysis session on 1/4 and tolerated it well. Patient was then transitioned to Tuesday, Thursday, Saturday HD schedule while hospitalized and will be transitioned to Monday, Wednesday, Friday schedule per SNF. She remained oliguric during admission. CXR with no cavitary lesions in the lung fields and patient has no other signs of TB, thus patient is cleared for outpatient HD. Patient was not able to get dialysis on the day of discharge, but will be sent out with lasix 80 mg daily and kclor 30mEq daily x 4 days, until she gets HD on Monday.   #Acute hypoxic respiratory failure due to COVID-19 pneumonia Patient came in acutely short of breath and was found to be COVID positive. She was initially requiring 4 L oxygen and then was put on BIPAP to try and help with her acidosis of 7.316 and a low bicarb. She was started on steroids with decadron 6 mg daily but was  not given remdesivir given her renal failure as above. She was able to be weaned off of the supplemental O2 and inflammatory markers continued to trend down during hospitalization. Decadron was stopped after 6 days of therapy, as patient's respiratory function remained stable. Did not require supplemental O2 after this.   #Uremic bleeding #Upper GI bleed #Anemia of chronic disease Patient had previous hospitalization in November where she had melena on anticoagulation. EGD and colonoscopy at that time showed gastritis and a few colonic polyps, which were removed. Bleeding was self limited at that time, and the patient was safe to resume anticoagulation. During this hospitalization, the patient had persistent bleeding with acute blood loss anemia requiring 4 total units of PRBC. She was anticoagulated with heparin for her Afib, however, this was discontinued on 1/1 in the setting of her bleeding. Patient was also noted to have significant ecchymosis on her arms and chest. She had an HD catheter placed on 1/3 and had oozing from the tunnel insertion site that day. Suspect this patient is having slow bleeding from her previously identified gastritis. Her BUN on 1/3 was elevated to 106, and she had thrombocytopenia likely from NASH cirrhosis, putting patient at risk for uremic platelets which is worsening her bleeding. Treated upper GI bleed with IV protonix 40 mg bid, IV octreotide, midodrine 5 mg tid, and a dose of DDAVP. GI consulted and felt that bleeding was related to hemmorhoids, thus treated patient with hydrocortisone suppositories and did not pursue full GI bleed workout, as she has recently had  this done. Bleeding resolved and patient was started back on anticoagulation. On 1/12, Hb dropped to 6.8 (from 7.2) and patient was given 1u PRBC. Post transfusion Hb stable.   #Protein-calorie malnutrition #Deconditioning Severe malnutrition 2/2 to multiple comorbidities. Continued Boost Breeze supplements  tid per RD recommendations.   #Lethargy and altered mental status Patient reportedly more lethargic overnight on 1/11 and was difficult to arouse. ABG with no acidosis or hypercarbia noted. Upon our evaluation later that morning, she is difficult to arouse and will not keep her eyes open for a long period of time, thus obtained CT head. CT head with no acute intracranial abnormalities. Lactic acid is reassuring. We obtained blood cultures due to recent history of infection, looked at wound on her foot which did not show signs of purulence or cellulitis. This could be hospital delirium with waxing and waning course. No sedating medications on patient's med list.   #Thrombocytopenia Thrombocytopenia is likely in the setting of uremia. Treated with DDAVP as noted above. Platelets remained stable.   #Cirrhosis 2/2 NASH No ascites noted on imaging or on exam. Patient was started on ceftriaxone daily in the setting of UTI, however, continued this antibiotic in the setting of SBP prophylaxis until her bleeding resolves.   #Chronic diastolic heart failure #Paroxysmal atrial fibrillation #Hypertension Patient was rate controlled on home metoprolol and cardizem which were initially held on admission in the setting of the acute illness above. She developed afib with RVR was briefly started on a cardizem drip which was transitioned back to her home dose of cardizem. Metoprolol was also added back the next day. Home torsemide was held. Held torsemide at discharge in the setting of her worsening kidney function, however, resumed hydralazine although this was held during admission. Recommend follow up with PCP and nephrologist regarding hydralazine.   #UTI Pyuria seen on UA and patient had minimal urinary symptoms. She was started on ceftriaxone 1 g for 5 days and the course of treatment ended on 1/2.   #Left foot Wound Had second left ray and metatarsal amputated at last hospital admission in November  secondary to osteomyelitis. Was using a wound vac at SNF however this was not able to be brought with EMS. She was seen by wound care here and a wound vac was replaced.

## 2021-01-12 NOTE — Progress Notes (Addendum)
Patient had large bloody bowel movement. Stool ranging from black to burgundy with blood clots. MD notified, coming to bedside. Order to hold heparin IV.

## 2021-01-12 NOTE — Progress Notes (Signed)
Nephrology Follow-Up Consult note   Assessment/Recommendations: Autumn [name] is a/an 71 y.o. female with a past medical history significant for DM2, HTN, hypothyroidism, NASH cirrhosis, pAfib, CVA, admitted for COVID pneumonia c/b AKI.     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  Termed as being proteinuric kidney disease.  UA does show some protein but not extensive.  CKD has been progressive also with recent AKI during hospitalization for osteomyelitis.  Baseline creatinine around 3.6-4 at this time.  Now with AKI likely secondary to ATN associated with COVID.  Creatinine continues to slowly worsen today up to 7. Labs came back after I saw patient today and she continues to be hesitant to start dialysis. I would re-address tomorrow but if Crt does not improve in the next 1-2 days I think she will likely need to start HD.  -Continue to monitor daily Cr, Dose meds for GFR -Monitor Daily I/Os, Daily weight  -Maintain MAP>65 for optimal renal perfusion.  -Avoid nephrotoxic medications including NSAIDs and Vanc/Zosyn combo  Acute hypoxic respiratory failure secondary to COVID-pneumonia: overall improved.  Continue management per primary team.    Anemia: Likely multifactorial with acute illness, chronic inflammation, CKD contributing.  Continue transfusion per primary team. Will check iron stores and consider ESA.   Metabolic acidosis: Mixed anion gap and non-anion gap likely secondary to acute kidney injury.  continue sodium bicarb to 1300 mg 3 times daily.  S/p IV bicarb with good response.  UTI: Pyuria on urinalysis.  Minimal symptoms were because of AKI we will treat.  Ceftriaxone 1 g x 5 days.    Atrial fibrillation: More tachycardic. Management per primary Left foot wound: Status post amputation.  Management per primary Diabetes Mellitus Type 2 with Hyperglycemia:  Management per primary    Recommendations conveyed to primary service.    [facility] 01/12/2021 9:20 AM  ___________________________________________________________  CC: COVID pneumonia, AKI  Interval History/Subjective: Minimal UOP documented. Patient continues to feel well and denies significant SOB. A lot more tachycardia over last 24 hours.   Medications:  Current Facility-Administered Medications  Medication Dose Route Frequency Provider Last Rate Last Admin   acetaminophen (TYLENOL) tablet 650 mg  650 mg Oral Q6H PRN Sanjuan Dame, MD       albuterol (PROVENTIL) (2.5 MG/3ML) 0.083% nebulizer solution 3 mL  3 mL Inhalation Q4H PRN Sanjuan Dame, MD       atorvastatin (LIPITOR) tablet 40 mg  40 mg Oral Daily Sanjuan Dame, MD   40 mg at 01/12/21 0917   cefTRIAXone (ROCEPHIN) 1 g in sodium chloride 0.9 % 100 mL IVPB  1 g Intravenous Q24H Reesa Chew, MD 200 mL/hr at 01/11/21 1016 1 g at 01/11/21 1016   Chlorhexidine Gluconate Cloth 2 % PADS 6 each  6 each Topical Daily Sid Falcon, MD   6 each at 01/12/21 0918   dexamethasone (DECADRON) tablet 6 mg  6 mg Oral Daily Sanjuan Dame, MD   6 mg at 01/12/21 6812   diltiazem (CARDIZEM CD) 24 hr capsule 180 mg  180 mg Oral Daily Sanjuan Dame, MD   180 mg at 01/12/21 0916   feeding supplement (ENSURE ENLIVE / ENSURE PLUS) liquid 237 mL  237 mL Oral TID BM Gilles Chiquito B, MD   237 mL at 01/12/21 0918   heparin ADULT infusion 100 units/mL (25000 units/259mL)  500 Units/hr Intravenous  Continuous Liz Beach, RPH 5 mL/hr at 01/12/21 0918 500 Units/hr at 01/12/21 0918   insulin aspart (novoLOG) injection 0-15 Units  0-15 Units Subcutaneous Q4H Sanjuan Dame, MD   2 Units at 01/12/21 0057   levothyroxine (SYNTHROID) tablet 100 mcg  100 mcg Oral Q0600 Sanjuan Dame, MD   100 mcg at 01/12/21 0917   metoprolol tartrate (LOPRESSOR) tablet 50 mg  50 mg Oral BID Sanjuan Dame, MD   50 mg at 01/12/21 7371   multivitamin with minerals tablet 1 tablet  1 tablet Oral Daily Gilles Chiquito B, MD   1 tablet at 01/12/21 0917   pantoprazole (PROTONIX) EC tablet 40 mg  40 mg Oral BID Sanjuan Dame, MD   40 mg at 01/12/21 0626   senna-docusate (Senokot-S) tablet 1 tablet  1 tablet Oral QHS PRN Sanjuan Dame, MD       sodium bicarbonate tablet 1,300 mg  1,300 mg Oral TID Reesa Chew, MD   1,300 mg at 01/12/21 9485      Review of Systems: 10 systems reviewed and negative except per interval history/subjective  Physical Exam: Vitals:   01/12/21 0852 01/12/21 0916  BP: (!) 143/117 (!) 133/93  Pulse: 100   Resp: 17   Temp:    SpO2: 100%    No intake/output data recorded.  Intake/Output Summary (Last 24 hours) at 01/12/2021 0920 Last data filed at 01/11/2021 2142 Gross per 24 hour  Intake 661.63 ml  Output 120 ml  Net 541.63 ml   Constitutional: Chronically ill-appearing, lying in bed, no distress ENMT: ears and nose without scars or lesions, dry mucous membranes CV: normal rate, no edema Respiratory: Bilateral chest rise with no increased work of breathing Gastrointestinal: soft, non-tender, no palpable masses or hernias Skin: no visible lesions or rashes Psych: alert, judgement/insight appropriate, appropriate mood and affect   Test Results I personally reviewed new and old clinical labs and radiology tests Lab Results  Component Value Date   NA 138 01/12/2021   K 3.8 01/12/2021   CL 108 01/12/2021   CO2 19 (L) 01/12/2021   BUN 96 (H) 01/12/2021   CREATININE 6.98 (H) 01/12/2021   CALCIUM 8.4 (L) 01/12/2021   ALBUMIN 2.8 (L) 01/12/2021   PHOS 4.9 (H) 01/12/2021

## 2021-01-13 ENCOUNTER — Inpatient Hospital Stay (HOSPITAL_COMMUNITY): Payer: Medicare (Managed Care)

## 2021-01-13 ENCOUNTER — Encounter (HOSPITAL_COMMUNITY): Payer: Medicare (Managed Care)

## 2021-01-13 DIAGNOSIS — I4891 Unspecified atrial fibrillation: Secondary | ICD-10-CM

## 2021-01-13 DIAGNOSIS — E1122 Type 2 diabetes mellitus with diabetic chronic kidney disease: Secondary | ICD-10-CM

## 2021-01-13 DIAGNOSIS — Z87891 Personal history of nicotine dependence: Secondary | ICD-10-CM

## 2021-01-13 DIAGNOSIS — Z7901 Long term (current) use of anticoagulants: Secondary | ICD-10-CM

## 2021-01-13 DIAGNOSIS — I129 Hypertensive chronic kidney disease with stage 1 through stage 4 chronic kidney disease, or unspecified chronic kidney disease: Secondary | ICD-10-CM

## 2021-01-13 DIAGNOSIS — N184 Chronic kidney disease, stage 4 (severe): Secondary | ICD-10-CM

## 2021-01-13 DIAGNOSIS — N179 Acute kidney failure, unspecified: Secondary | ICD-10-CM

## 2021-01-13 DIAGNOSIS — J1289 Other viral pneumonia: Secondary | ICD-10-CM

## 2021-01-13 DIAGNOSIS — U071 COVID-19: Secondary | ICD-10-CM

## 2021-01-13 DIAGNOSIS — Z79899 Other long term (current) drug therapy: Secondary | ICD-10-CM

## 2021-01-13 LAB — COMPREHENSIVE METABOLIC PANEL
ALT: 10 U/L (ref 0–44)
AST: 19 U/L (ref 15–41)
Albumin: 2.7 g/dL — ABNORMAL LOW (ref 3.5–5.0)
Alkaline Phosphatase: 53 U/L (ref 38–126)
Anion gap: 16 — ABNORMAL HIGH (ref 5–15)
BUN: 102 mg/dL — ABNORMAL HIGH (ref 8–23)
CO2: 19 mmol/L — ABNORMAL LOW (ref 22–32)
Calcium: 8.4 mg/dL — ABNORMAL LOW (ref 8.9–10.3)
Chloride: 104 mmol/L (ref 98–111)
Creatinine, Ser: 7.03 mg/dL — ABNORMAL HIGH (ref 0.44–1.00)
GFR, Estimated: 6 mL/min — ABNORMAL LOW (ref >60)
Glucose, Bld: 121 mg/dL — ABNORMAL HIGH (ref 70–99)
Potassium: 4.1 mmol/L (ref 3.5–5.1)
Sodium: 139 mmol/L (ref 135–145)
Total Bilirubin: 0.8 mg/dL (ref 0.3–1.2)
Total Protein: 4.9 g/dL — ABNORMAL LOW (ref 6.5–8.1)

## 2021-01-13 LAB — TYPE AND SCREEN
ABO/RH(D): O POS
Antibody Screen: NEGATIVE
Unit division: 0
Unit division: 0
Unit division: 0

## 2021-01-13 LAB — BPAM RBC
Blood Product Expiration Date: 202301042359
Blood Product Expiration Date: 202301192359
Blood Product Expiration Date: 202301202359
ISSUE DATE / TIME: 202212290845
ISSUE DATE / TIME: 202212301026
ISSUE DATE / TIME: 202301011722
Unit Type and Rh: 5100
Unit Type and Rh: 5100
Unit Type and Rh: 9500

## 2021-01-13 LAB — CBC WITH DIFFERENTIAL/PLATELET
Abs Immature Granulocytes: 0.09 10*3/uL — ABNORMAL HIGH (ref 0.00–0.07)
Basophils Absolute: 0 10*3/uL (ref 0.0–0.1)
Basophils Relative: 0 %
Eosinophils Absolute: 0 10*3/uL (ref 0.0–0.5)
Eosinophils Relative: 0 %
HCT: 25.4 % — ABNORMAL LOW (ref 36.0–46.0)
Hemoglobin: 8.3 g/dL — ABNORMAL LOW (ref 12.0–15.0)
Immature Granulocytes: 1 %
Lymphocytes Relative: 4 %
Lymphs Abs: 0.5 10*3/uL — ABNORMAL LOW (ref 0.7–4.0)
MCH: 28.3 pg (ref 26.0–34.0)
MCHC: 32.7 g/dL (ref 30.0–36.0)
MCV: 86.7 fL (ref 80.0–100.0)
Monocytes Absolute: 1 10*3/uL (ref 0.1–1.0)
Monocytes Relative: 8 %
Neutro Abs: 10.2 10*3/uL — ABNORMAL HIGH (ref 1.7–7.7)
Neutrophils Relative %: 87 %
Platelets: 115 10*3/uL — ABNORMAL LOW (ref 150–400)
RBC: 2.93 MIL/uL — ABNORMAL LOW (ref 3.87–5.11)
RDW: 22.2 % — ABNORMAL HIGH (ref 11.5–15.5)
WBC: 11.7 10*3/uL — ABNORMAL HIGH (ref 4.0–10.5)
nRBC: 0 % (ref 0.0–0.2)

## 2021-01-13 LAB — GLUCOSE, CAPILLARY
Glucose-Capillary: 119 mg/dL — ABNORMAL HIGH (ref 70–99)
Glucose-Capillary: 123 mg/dL — ABNORMAL HIGH (ref 70–99)
Glucose-Capillary: 126 mg/dL — ABNORMAL HIGH (ref 70–99)
Glucose-Capillary: 138 mg/dL — ABNORMAL HIGH (ref 70–99)
Glucose-Capillary: 160 mg/dL — ABNORMAL HIGH (ref 70–99)
Glucose-Capillary: 94 mg/dL (ref 70–99)

## 2021-01-13 LAB — C-REACTIVE PROTEIN: CRP: 0.5 mg/dL (ref <1.0)

## 2021-01-13 LAB — CBC
HCT: 23.2 % — ABNORMAL LOW (ref 36.0–46.0)
Hemoglobin: 7.6 g/dL — ABNORMAL LOW (ref 12.0–15.0)
MCH: 28.5 pg (ref 26.0–34.0)
MCHC: 32.8 g/dL (ref 30.0–36.0)
MCV: 86.9 fL (ref 80.0–100.0)
Platelets: 114 10*3/uL — ABNORMAL LOW (ref 150–400)
RBC: 2.67 MIL/uL — ABNORMAL LOW (ref 3.87–5.11)
RDW: 22.2 % — ABNORMAL HIGH (ref 11.5–15.5)
WBC: 8.2 10*3/uL (ref 4.0–10.5)
nRBC: 0.2 % (ref 0.0–0.2)

## 2021-01-13 LAB — PHOSPHORUS: Phosphorus: 5.3 mg/dL — ABNORMAL HIGH (ref 2.5–4.6)

## 2021-01-13 LAB — HEPATITIS B SURFACE ANTIBODY,QUALITATIVE: Hep B S Ab: REACTIVE — AB

## 2021-01-13 LAB — HEMOGLOBIN AND HEMATOCRIT, BLOOD
HCT: 25.7 % — ABNORMAL LOW (ref 36.0–46.0)
Hemoglobin: 8.3 g/dL — ABNORMAL LOW (ref 12.0–15.0)

## 2021-01-13 LAB — HEPATITIS B SURFACE ANTIGEN: Hepatitis B Surface Ag: NONREACTIVE

## 2021-01-13 LAB — FERRITIN: Ferritin: 48 ng/mL (ref 11–307)

## 2021-01-13 LAB — MAGNESIUM: Magnesium: 2.2 mg/dL (ref 1.7–2.4)

## 2021-01-13 LAB — D-DIMER, QUANTITATIVE: D-Dimer, Quant: 3.73 ug/mL-FEU — ABNORMAL HIGH (ref 0.00–0.50)

## 2021-01-13 LAB — HEPARIN LEVEL (UNFRACTIONATED): Heparin Unfractionated: >1.1 IU/mL — ABNORMAL HIGH (ref 0.30–0.70)

## 2021-01-13 MED ORDER — PENTAFLUOROPROP-TETRAFLUOROETH EX AERO: 1.0000 "application " | INHALATION_SPRAY | CUTANEOUS | Status: DC | PRN

## 2021-01-13 MED ORDER — HEPARIN SODIUM (PORCINE) 1000 UNIT/ML DIALYSIS
1000.0000 [IU] | INTRAMUSCULAR | Status: DC | PRN
  Administered 2021-01-15: 1000 [IU] via INTRAVENOUS_CENTRAL
  Filled 2021-01-13: qty 1

## 2021-01-13 MED ORDER — LIDOCAINE HCL (PF) 1 % IJ SOLN: 5.0000 mL | INTRAMUSCULAR | Status: DC | PRN

## 2021-01-13 MED ORDER — ALTEPLASE 2 MG IJ SOLR: 2.0000 mg | Freq: Once | INTRAMUSCULAR | Status: DC | PRN

## 2021-01-13 MED ORDER — LIDOCAINE-PRILOCAINE 2.5-2.5 % EX CREA
1.0000 "application " | TOPICAL_CREAM | CUTANEOUS | Status: DC | PRN
  Filled 2021-01-13: qty 5

## 2021-01-13 MED ORDER — SODIUM CHLORIDE 0.9 % IV SOLN: 100.0000 mL | INTRAVENOUS | Status: DC | PRN

## 2021-01-13 MED ORDER — CHLORHEXIDINE GLUCONATE CLOTH 2 % EX PADS
6.0000 | MEDICATED_PAD | Freq: Every day | CUTANEOUS | Status: DC
  Administered 2021-01-13: 6 via TOPICAL

## 2021-01-13 NOTE — Evaluation (Signed)
Occupational Therapy Evaluation Patient Details Name: Autumn [name] MRN: [id number] DOB: [birth year] Today's Date: 01/13/2021   History of Present Illness Pt is a 71 y/o female admitted 12/29 is dyspnea.  +COVID, with pneumonia,  acute on chronic renal failure, AKI, UTI.  GIB 1/1. PMH includes: DM2, HTN, NASH cirrhosis, afib, CVA, CKD4, L LE wound/vac.   Clinical Impression     Pt supine in bed and cleared for engagement in OT with RN.  Patient admitted for above and limited by problem list below, including cognition, weakness, impaired activity tolerance.  Assisted pt with rolling in bed, mod assist, and requires up to total assist for ADLs at bed level (RN aware of large bloody BM).  Patient disoriented to place (reports [place]), but able to follow simple commands with increased time.  Poor historian, but reports from SNF and required assist recently with ADLs, mobility.  Will follow acutely.    Recommendations for follow up therapy are one component of a multi-disciplinary discharge planning process, led by the attending physician.  Recommendations may be updated based on patient status, additional functional criteria and insurance authorization.   Follow Up Recommendations  Skilled nursing-short term rehab (<3 hours/day)    Assistance Recommended at Discharge Frequent or constant Supervision/Assistance  Functional Status Assessment  Patient has had a recent decline in their functional status and demonstrates the ability to make significant improvements in function in a reasonable and predictable amount of time.  Equipment Recommendations  Other (comment) (TBD)    Recommendations for Other Services       Precautions / Restrictions Precautions Precautions: Fall Precaution Comments: wound vac and post op shoe to L LE Restrictions Weight Bearing Restrictions: No LLE Weight Bearing: Weight bearing as tolerated Other Position/Activity Restrictions: post op shoe on LLE       Mobility Bed Mobility Overal bed mobility: Needs Assistance Bed Mobility: Rolling Rolling: Mod assist         General bed mobility comments: mod assist to roll L/R with cueing for technique    Transfers                          Balance                                           ADL either performed or assessed with clinical judgement   ADL Overall ADL's : Needs assistance/impaired     Grooming: Wash/dry face;Set up;Bed level           Upper Body Dressing : Moderate assistance;Bed level   Lower Body Dressing: Total assistance;Bed level     Toilet Transfer Details (indicate cue type and reason): deferred Toileting- Clothing Manipulation and Hygiene: Total assistance;Bed level       Functional mobility during ADLs: Moderate assistance (limited to rolling in bed) General ADL Comments: bed level with total assist for hygiene and RN notified due to large bloody BM in bed.     Vision   Additional Comments: furhter assessment     Perception     Praxis      Pertinent Vitals/Pain Pain Assessment: No/denies pain     Hand Dominance Right   Extremity/Trunk Assessment Upper Extremity Assessment Upper Extremity Assessment: Generalized weakness (L UE edema, Limited B shoulder ROM to 90)   Lower Extremity Assessment Lower Extremity Assessment: Defer to PT evaluation  Communication Communication Communication: Expressive difficulties   Cognition Arousal/Alertness: Awake/alert Behavior During Therapy: WFL for tasks assessed/performed;Anxious Overall Cognitive Status: Impaired/Different from baseline Area of Impairment: Awareness;Problem solving;Memory;Following commands;Orientation                 Orientation Level: Disoriented to;Place (reports [place])   Memory: Decreased short-term memory Following Commands: Follows one step commands consistently;Follows one step commands with increased time   Awareness:  Emergent Problem Solving: Slow processing;Requires verbal cues General Comments: pt following commands with increased time, no awareness to +BM in bed.     General Comments       Exercises     Shoulder Instructions      Home Living Family/patient expects to be discharged to:: Skilled nursing facility                                        Prior Functioning/Environment Prior Level of Function : Needs assist;Patient poor historian/Family not available             Mobility Comments: reports assist to scoot to wheelchair, assist with wc mobility ADLs Comments: reports assist for ADLs from bed with assist, feeding self "if I felt like eating"        OT Problem List: Decreased strength;Decreased range of motion;Decreased activity tolerance;Impaired balance (sitting and/or standing);Decreased coordination;Decreased cognition;Decreased safety awareness;Impaired UE functional use      OT Treatment/Interventions: Self-care/ADL training;Therapeutic exercise;Energy conservation;DME and/or AE instruction;Therapeutic activities;Cognitive remediation/compensation;Patient/family education;Balance training    OT Goals(Current goals can be found in the care plan section) Acute Rehab OT Goals Patient Stated Goal: get better OT Goal Formulation: With patient Time For Goal Achievement: 01/27/21 Potential to Achieve Goals: Fair  OT Frequency: Min 2X/week   Barriers to D/C:            Co-evaluation              AM-PAC OT "6 Clicks" Daily Activity     Outcome Measure Help from another person eating meals?: A Little Help from another person taking care of personal grooming?: A Little Help from another person toileting, which includes using toliet, bedpan, or urinal?: Total Help from another person bathing (including washing, rinsing, drying)?: A Lot Help from another person to put on and taking off regular upper body clothing?: A Lot Help from another person to  put on and taking off regular lower body clothing?: Total 6 Click Score: 12   End of Session Equipment Utilized During Treatment: Oxygen Nurse Communication: Mobility status  Activity Tolerance: Patient tolerated treatment well Patient left: in bed;with call bell/phone within reach;with bed alarm set  OT Visit Diagnosis: Other abnormalities of gait and mobility (R26.89);Muscle weakness (generalized) (M62.81)                Time: 1025-8527 OT Time Calculation (min): 32 min Charges:  OT General Charges $OT Visit: 1 Visit OT Evaluation $OT Eval Moderate Complexity: 1 Mod OT Treatments $Self Care/Home Management : 8-22 mins  Jolaine Artist, OT Acute Rehabilitation Services Pager [phone] Office [phone]   Delight Stare 01/13/2021, 11:34 AM

## 2021-01-13 NOTE — Progress Notes (Signed)
PT Cancellation Note  Patient Details Name: Autumn [name] MRN: [id number] DOB: 02-10-1950   Cancelled Treatment:    Reason Eval/Treat Not Completed: Medical issues which prohibited therapy.  Awaiting guidance on pt's GI bleeding.  Retry at another time.   Ramond Dial 01/13/2021, 10:21 AM  Mee Hives, PT PhD Acute Rehab Dept. Number: [place] [facility] [place]

## 2021-01-13 NOTE — NC FL2 (Signed)
[place] MEDICAID FL2 LEVEL OF CARE SCREENING TOOL     IDENTIFICATION  Patient Name: Autumn [name] Birthdate: Dec 28, 1950 Sex: female Admission Date (Current Location): 01/09/2021  [facility] [place] Number:  Herbalist and Address:  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[place])    Acute hypoxemic respiratory failure ([place])    Left wrist pain    Acute stroke due to ischemia [facility])    Neutropenia ([place]) 12/08/2020   Drug-induced neutropenia (HCC)    Normocytic anemia    Thrombocytopenia ([place])    Bilateral carotid artery stenosis    Atrial fibrillation with rapid ventricular response ([place])    Cellulitis and abscess of foot, except toes    Infectious tenosynovitis    Wheeze    AKI (acute kidney injury) ([place])    Anemia of chronic disease    Hypothyroidism    Atrial fibrillation with RVR ([place]) 11/09/2020   Osteomyelitis of foot, left, acute ([place]) 11/09/2020   CKD (chronic kidney disease), stage IV ([place]) 11/09/2020   Liver cirrhosis secondary to NASH (nonalcoholic steatohepatitis) ([place]) 11/09/2020   Hypertension    History of anemia due to CKD    Severe sepsis with septic shock ([place])    Frequent falls    Diabetic ulcer of left midfoot associated with type 2 diabetes mellitus ([place]) 06/29/2020   Volume overload 05/20/2020   Moderate episode of recurrent major depressive disorder ([place]) 03/08/2020    Chronic right-sided heart failure ([place]) 08/22/2019   Orthostatic hypotension 11/07/2018   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 11/10/2017   Nephrotic syndrome 05/24/2017   Iron deficiency anemia due to chronic blood loss 08/30/2015   Risk for falls 04/19/2015   Edema of left lower extremity 02/15/2015   Vertigo 02/15/2015   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 06/01/2012   Chronic nonalcoholic liver disease 41/32/4401   GERD (gastroesophageal reflux disease) 06/01/2012   Calculus of kidney and ureter 07/29/2011    Orientation RESPIRATION BLADDER Height & Weight     Self, Time, Situation, Place    Incontinent, External catheter Weight: 192 lb 3.9 oz (87.2 kg) Height:  5\' 7"  (170.2 cm)  BEHAVIORAL SYMPTOMS/MOOD NEUROLOGICAL BOWEL NUTRITION STATUS   (None)  (None) Incontinent Diet (DYS 3)  AMBULATORY STATUS COMMUNICATION OF NEEDS Skin   Extensive Assist Verbally Wound Vac, Skin abrasions, Bruising, Other (Comment), Surgical wounds (Toe amputation, rash. Incision on left foot: Wet to dry and wound vac.)                       Personal Care Assistance Level of Assistance  Bathing, Feeding, Dressing           Functional Limitations Info  Sight, Hearing, Speech Sight Info: Adequate Hearing Info: Adequate Speech Info: Adequate (Slurred/Dysarthria)    SPECIAL CARE  FACTORS FREQUENCY  PT (By licensed PT), OT (By licensed OT), Speech therapy                    Contractures Contractures Info: Not present    Additional Factors Info  Code Status, Allergies Code Status Info: Full Allergies Info: Codeine           Current Medications (01/13/2021):  This is the current hospital active medication list Current Facility-Administered Medications  Medication Dose Route Frequency Provider Last Rate Last Admin   0.9 %  sodium chloride infusion  100 mL Intravenous PRN Dwana Melena, MD       0.9 %  sodium  chloride infusion  100 mL Intravenous PRN Dwana Melena, MD       acetaminophen (TYLENOL) tablet 650 mg  650 mg Oral Q6H PRN Sanjuan Dame, MD   650 mg at 01/13/21 0854   albuterol (PROVENTIL) (2.5 MG/3ML) 0.083% nebulizer solution 3 mL  3 mL Inhalation Q4H PRN Sanjuan Dame, MD       alteplase (CATHFLO ACTIVASE) injection 2 mg  2 mg Intracatheter Once PRN Dwana Melena, MD       atorvastatin (LIPITOR) tablet 40 mg  40 mg Oral Daily Sanjuan Dame, MD   40 mg at 01/13/21 0855   cefTRIAXone (ROCEPHIN) 1 g in sodium chloride 0.9 % 100 mL IVPB  1 g Intravenous Q24H Reesa Chew, MD   Stopped at 01/13/21 1319   Chlorhexidine Gluconate Cloth 2 % PADS 6 each  6 each Topical Daily Sid Falcon, MD   6 each at 01/12/21 0918   Chlorhexidine Gluconate Cloth 2 % PADS 6 each  6 each Topical Q0600 Dwana Melena, MD   6 each at 01/13/21 0855   dexamethasone (DECADRON) tablet 6 mg  6 mg Oral Daily Sanjuan Dame, MD   6 mg at 01/13/21 0855   diltiazem (CARDIZEM CD) 24 hr capsule 180 mg  180 mg Oral Daily Sanjuan Dame, MD   180 mg at 01/13/21 0855   feeding supplement (ENSURE ENLIVE / ENSURE PLUS) liquid 237 mL  237 mL Oral TID BM Gilles Chiquito B, MD   237 mL at 01/13/21 1253   heparin injection 1,000 Units  1,000 Units Dialysis PRN Dwana Melena, MD       insulin aspart (novoLOG) injection 0-15 Units  0-15 Units Subcutaneous Q4H Sanjuan Dame, MD   3 Units at 01/13/21 1715   levothyroxine (SYNTHROID) tablet 100 mcg  100 mcg Oral Q0600 Sanjuan Dame, MD   100 mcg at 01/13/21 0518   lidocaine (PF) (XYLOCAINE) 1 % injection 5 mL  5 mL Intradermal PRN Dwana Melena, MD       lidocaine-prilocaine (EMLA) cream 1 application  1 application Topical PRN Dwana Melena, MD       metoprolol tartrate (LOPRESSOR) tablet 50 mg  50 mg Oral BID Sanjuan Dame, MD   50 mg at 01/13/21 1962   multivitamin with minerals tablet 1 tablet  1 tablet Oral Daily Gilles Chiquito B, MD   1 tablet at 01/13/21 0855    pantoprazole (PROTONIX) EC tablet 40 mg  40 mg Oral BID Sanjuan Dame, MD   40 mg at 01/13/21 0855   pentafluoroprop-tetrafluoroeth (GEBAUERS) aerosol 1 application  1 application Topical PRN Dwana Melena, MD       senna-docusate (Senokot-S) tablet 1 tablet  1 tablet Oral QHS PRN Sanjuan Dame, MD  sodium bicarbonate tablet 1,300 mg  1,300 mg Oral TID Reesa Chew, MD   1,300 mg at 01/13/21 1715     Discharge Medications: Please see discharge summary for a list of discharge medications.  Relevant Imaging Results:  Relevant Lab Results:   Additional Information SS#: [id number]; Moderna COVID-19 Vaccine 06/21/2020  Pfizer COVID-19 Vaccine 11/22/2019  Reece Agar, LCSWA

## 2021-01-13 NOTE — Consult Note (Signed)
Hospital Consult    Reason for Consult:  Evaluate permanent hemodialysis access Referring Physician:  Nephrology MRN #:  [id number]  History of Present Illness: This is a 71 y.o. female with history of diabetes, NASH cirrhosis, atrial fibrillation on eliquis, HTN and an AKI on stage IV CKD admitted with COVID pneumonia that vascular surgery has been consulted for permanent hemodialysis access.  Patient states she is right-handed.  She has had no previous vascular access in the past.  Plan is tunneled dialysis catheter tomorrow with IR tomorrow.  Unfortunately she did have a GI bleed last night while being bridged off of her DOAC on heparin.  The heparin has since been held.  She has no chest wall implants.  She does have notable left arm swelling that she states has been chronic.  Past Medical History:  Diagnosis Date   Diabetes mellitus without complication ([place])    Hypertension    Kidney stone    Thyroid disease     Past Surgical History:  Procedure Laterality Date   ABDOMINAL HYSTERECTOMY     AMPUTATION Left 11/11/2020   Procedure: AMPUTATION RAY - 2nd metatarsal and 3rd metatarsal head;  Surgeon: [name], DPM;  Location: [facility] ORS;  Service: Podiatry;  Laterality: Left;   CAROTID ANGIOGRAPHY Left 11/22/2020   Procedure: CAROTID ANGIOGRAPHY;  Surgeon: Katha Cabal, MD;  Location: [place] [facility] [facility];  Service: Cardiovascular;  Laterality: Left;   COLONOSCOPY WITH PROPOFOL N/A 11/29/2020   Procedure: COLONOSCOPY WITH PROPOFOL;  Surgeon: Lesly Rubenstein, MD;  Location: [facility] ENDOSCOPY;  Service: Endoscopy;  Laterality: N/A;   ESOPHAGOGASTRODUODENOSCOPY (EGD) WITH PROPOFOL N/A 11/27/2020   Procedure: ESOPHAGOGASTRODUODENOSCOPY (EGD) WITH PROPOFOL;  Surgeon: Lesly Rubenstein, MD;  Location: [facility] ENDOSCOPY;  Service: Endoscopy;  Laterality: N/A;   URETERAL STENT PLACEMENT      Allergies  Allergen Reactions   Codeine Nausea And Vomiting    Prior to Admission  medications   Medication Sig Start Date End Date Taking? Authorizing Provider  acetaminophen (TYLENOL) 325 MG tablet Take 325 mg by mouth every 6 (six) hours as needed.   Yes [provider]  albuterol (PROVENTIL) (2.5 MG/3ML) 0.083% nebulizer solution Take 2.5 mg by nebulization every 8 (eight) hours as needed for wheezing or shortness of breath.   Yes [provider]  albuterol (VENTOLIN HFA) 108 (90 Base) MCG/ACT inhaler Inhale 2 puffs into the lungs 4 (four) times daily as needed for wheezing. 03/25/18  Yes [provider]  allopurinol (ZYLOPRIM) 100 MG tablet Take 0.5 tablets (50 mg total) by mouth every other day. 12/22/20  Yes Wieting, Richard, MD  ALPRAZolam Duanne Moron) 0.25 MG tablet Take 1 tablet (0.25 mg total) by mouth 2 (two) times daily as needed for anxiety. 12/20/20  Yes Wieting, Richard, MD  apixaban (ELIQUIS) 5 MG TABS tablet Take 1 tablet (5 mg total) by mouth 2 (two) times daily. 12/20/20  Yes Wieting, Richard, MD  ascorbic acid (VITAMIN C) 500 MG tablet Take 1 tablet (500 mg total) by mouth 2 (two) times daily. 12/20/20  Yes Wieting, Richard, MD  atorvastatin (LIPITOR) 40 MG tablet Take 1 tablet (40 mg total) by mouth daily. 12/21/20  Yes Wieting, Richard, MD  dicyclomine (BENTYL) 10 MG capsule Take 1 capsule (10 mg total) by mouth 4 (four) times daily -  before meals and at bedtime. 12/21/20  Yes Wieting, Richard, MD  diltiazem (CARDIZEM CD) 180 MG 24 hr capsule Take 1 capsule (180 mg total) by mouth daily. 12/21/20  Yes Loletha Grayer, MD  doxycycline (VIBRA-TABS) 100 MG tablet Take 100 mg by mouth 2 (two) times daily. 12/27/20  Yes [provider]  ferrous gluconate (FERGON) 324 MG tablet Take 1 tablet (324 mg total) by mouth daily with breakfast. 12/21/20  Yes Wieting, Richard, MD  hydrALAZINE (APRESOLINE) 100 MG tablet Take 1 tablet (100 mg total) by mouth every 8 (eight) hours. 12/20/20  Yes Wieting, Richard, MD  levofloxacin (LEVAQUIN) 500 MG  tablet Take 500 mg by mouth daily.   Yes [provider]  levothyroxine (SYNTHROID) 100 MCG tablet Take 100 mcg by mouth every morning. 10/04/20  Yes [provider]  meclizine (ANTIVERT) 12.5 MG tablet Take 12.5 mg by mouth 3 (three) times daily as needed for dizziness. 08/30/20  Yes [provider]  metoprolol tartrate (LOPRESSOR) 50 MG tablet Take 1 tablet (50 mg total) by mouth 2 (two) times daily. 12/20/20  Yes Loletha Grayer, MD  metroNIDAZOLE (FLAGYL) 500 MG tablet  12/21/20  Yes [provider]  Multiple Vitamin (MULTIVITAMIN WITH MINERALS) TABS tablet Take 1 tablet by mouth daily. 12/20/20  Yes Wieting, Richard, MD  Nutritional Supplements (,FEEDING SUPPLEMENT, PROSOURCE PLUS) liquid Take 30 mLs by mouth 3 (three) times daily between meals. 12/20/20  Yes Wieting, Richard, MD  ondansetron (ZOFRAN) 4 MG tablet Place 1 tablet (4 mg total) into feeding tube every 6 (six) hours as needed for nausea or vomiting. 12/20/20  Yes Wieting, Richard, MD  pantoprazole (PROTONIX) 40 MG tablet Take 1 tablet (40 mg total) by mouth 2 (two) times daily. 12/20/20  Yes Wieting, Richard, MD  polyethylene glycol (MIRALAX / GLYCOLAX) 17 g packet Take 17 g by mouth daily as needed for severe constipation. 12/21/20  Yes Wieting, Richard, MD  predniSONE (DELTASONE) 20 MG tablet Take 20 mg by mouth 2 (two) times daily. Take two tablets (40mg  total) by mouth daily for 5 days   Yes [provider]  torsemide (DEMADEX) 20 MG tablet Take 1 tablet (20 mg total) by mouth daily. 12/21/20  Yes Wieting, Richard, MD  tuberculin (TUBERSOL) 5 UNIT/0.1ML injection Inject 0.1 mLs into the skin once. Once every 14 days   Yes [provider]  venlafaxine (EFFEXOR) 50 MG tablet Take 1 tablet (50 mg total) by mouth 2 (two) times daily. 12/20/20  Yes Wieting, Richard, MD  liraglutide (VICTOZA) 18 MG/3ML SOPN Inject into the skin. Patient not taking: Reported on 01/10/2021 10/07/18 07/03/21   [provider]    Social History   Socioeconomic History   Marital status: Single    Spouse name: Not on file   Number of children: Not on file   Years of education: Not on file   Highest education level: Not on file  Occupational History   Not on file  Tobacco Use   Smoking status: Former   Smokeless tobacco: Never  Substance and Sexual Activity   Alcohol use: Never   Drug use: Never   Sexual activity: Not on file  Other Topics Concern   Not on file  Social History Narrative   Not on file   Social Determinants of Health   Financial Resource Strain: Not on file  Food Insecurity: Not on file  Transportation Needs: Not on file  Physical Activity: Not on file  Stress: Not on file  Social Connections: Not on file  Intimate Partner Violence: Not on file     Family History  Problem Relation Age of Onset   Hypertension Mother  ROS: [x]  Positive   [ ]  Negative   [ ]  All sytems reviewed and are negative  Cardiovascular: []  chest pain/pressure []  palpitations []  SOB lying flat []  DOE []  pain in legs while walking []  pain in legs at rest []  pain in legs at night []  non-healing ulcers []  hx of DVT []  swelling in legs X left arm swelling Pulmonary: []  productive cough []  asthma/wheezing []  home O2  Neurologic: []  weakness in []  arms []  legs []  numbness in []  arms []  legs []  hx of CVA []  mini stroke [] difficulty speaking or slurred speech []  temporary loss of vision in one eye []  dizziness  Hematologic: []  hx of cancer []  bleeding problems []  problems with blood clotting easily  Endocrine:   []  diabetes []  thyroid disease  GI []  vomiting blood []  blood in stool  GU: []  CKD/renal failure []  HD--[]  M/W/F or []  T/T/S []  burning with urination []  blood in urine  Psychiatric: []  anxiety []  depression  Musculoskeletal: []  arthritis []  joint pain  Integumentary: []  rashes []  ulcers  Constitutional: []  fever []   chills   Physical Examination  Vitals:   01/13/21 0806 01/13/21 1222  BP: 135/78 (!) 133/93  Pulse: 99 (!) 106  Resp: 14 14  Temp: 97.6 F (36.4 C) (!) 97.5 F (36.4 C)  SpO2: 97% 98%   Body mass index is 30.11 kg/m.  General:  NAD Gait: Not observed HENT: WNL, normocephalic Pulmonary: normal non-labored breathing Cardiac: Irregular Abdomen:  soft, NT/ND Vascular Exam/Pulses: Palpable radial brachial pulses bilateral upper extremity No upper extremity tissue loss Extremities: without ischemic changes Musculoskeletal: no muscle wasting or atrophy  Neurologic: A&O X 3; Appropriate Affect ; SENSATION: normal; MOTOR FUNCTION:  moving all extremities equally. Speech is fluent/normal   CBC    Component Value Date/Time   WBC 11.7 (H) 01/13/2021 0056   RBC 2.93 (L) 01/13/2021 0056   HGB 8.3 (L) 01/13/2021 0056   HGB 8.3 (L) 01/13/2021 0056   HCT 25.4 (L) 01/13/2021 0056   HCT 25.7 (L) 01/13/2021 0056   PLT 115 (L) 01/13/2021 0056   MCV 86.7 01/13/2021 0056   MCH 28.3 01/13/2021 0056   MCHC 32.7 01/13/2021 0056   RDW 22.2 (H) 01/13/2021 0056   LYMPHSABS 0.5 (L) 01/13/2021 0056   MONOABS 1.0 01/13/2021 0056   EOSABS 0.0 01/13/2021 0056   BASOSABS 0.0 01/13/2021 0056    BMET    Component Value Date/Time   NA 139 01/13/2021 0056   K 4.1 01/13/2021 0056   CL 104 01/13/2021 0056   CO2 19 (L) 01/13/2021 0056   GLUCOSE 121 (H) 01/13/2021 0056   BUN 102 (H) 01/13/2021 0056   CREATININE 7.03 (H) 01/13/2021 0056   CALCIUM 8.4 (L) 01/13/2021 0056   GFRNONAA 6 (L) 01/13/2021 0056   GFRAA 21 (L) 09/02/2018 1731    COAGS: Lab Results  Component Value Date   INR 3.4 (H) 01/09/2021   INR 1.3 (H) 11/22/2020   INR 1.3 (H) 11/09/2020     Non-Invasive Vascular Imaging:    Upper extremity vein mapping pending   ASSESSMENT/PLAN: This is a 71 y.o. female with history of diabetes, NASH cirrhosis, atrial fibrillation on eliquis, HTN and an AKI on stage IV CKD admitted  with COVID pneumonia that vascular surgery has been consulted for permanent hemodialysis access.  Patient is right-handed and discussed normally would recommend permanent access in the nondominant arm which would be her left arm.  I am somewhat concerned that she  has obvious left upper extremity swelling on exam that she states is chronic and this could certainly get worse if we place a fistula in her left arm.  I will await the results of vein mapping to make decisions about which arm would be best for access.  We will follow.  I discussed basic steps of fistula access surgery.  Discussed using vein versus graft.  Discussed risks including failure to mature and steal syndrome.  Certainly if her GI bleeding becomes an ongoing issue this would have to be stabilized before proceeding to the OR.  Marty Heck, MD [facility] Office: [place]

## 2021-01-13 NOTE — Consult Note (Signed)
[place] Nurse Consult Note: Reason for Consult:Left foot NPWT dressing change.  Alarm was unable to be fixed last PM, so saline dressing in place.  Wound type:surgical, s/p amputation 2nd ray and 3rd metatarsal head.  Nonhealing segment present to dorsal foot.  Pressure Injury POA: NA Measurement: 5 cm x 1.5 cm slough to wound bed.  Wound bed:40% red friable tissue with 60% yellow slough Drainage (amount, consistency, odor) minimal serosanguinous Periwound:scarring and contraction at suture line from toes to dorsal foot.  Barrier ring used around periwound to promote seal Dressing procedure/placement/frequency:1 piece black foam.  COvered with drape.  Trac pad placed over barrier ring segment to avoid trauma to periwound. Seal immediately achieved.  Supplies in room for next visit.  Will follow.  Domenic Moras MSN, RN, FNP-BC CWON Wound, Ostomy, Continence Nurse Pager [phone]

## 2021-01-13 NOTE — TOC Progression Note (Signed)
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         Expected Discharge Plan and Services                                                 Social Determinants of Health (SDOH) Interventions    Readmission Risk Interventions No flowsheet data found.

## 2021-01-13 NOTE — Evaluation (Signed)
Physical Therapy Evaluation Patient Details Name: Autumn [name] MRN: [id number] DOB: 1950/11/24 Today's Date: 01/13/2021  History of Present Illness  Pt is a 71 y/o female admitted 12/29 c SOB.  +COVID, with pneumonia,  acute on chronic renal failure, AKI, UTI.  GIB 1/1. Nursing cleared today for PT eval.  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  Even so, her ability to scoot on side of bed is dependent, and requires step by step cues for return to bed.  Pt is on RA, maintaining O2 sats at 96% or greater during entire session including repositioning to get higher in the bed.  Follow her for goals of acute PT with expectation that SNF care will resume given her strength, low sit balance and low endurance that will all hinder standing and progression to resume gait.       Recommendations for follow up therapy are one component of a multi-disciplinary discharge planning process, led by the attending physician.  Recommendations may be updated based on patient status, additional functional criteria and insurance authorization.  Follow Up Recommendations Skilled nursing-short term rehab (<3 hours/day)    Assistance Recommended at Discharge Frequent or constant Supervision/Assistance  Functional Status Assessment Patient has had a recent decline in their functional status and demonstrates the ability to make significant improvements in function in a reasonable and predictable amount of time.  Equipment Recommendations  None recommended by PT    Recommendations for Other Services       Precautions / Restrictions Precautions Precautions: Fall Precaution Comments: wound vac and post op shoe to L LE Restrictions Weight Bearing Restrictions: Yes LLE Weight Bearing: Weight  bearing as tolerated Other Position/Activity Restrictions: post op shoe on LLE      Mobility  Bed Mobility Overal bed mobility: Needs Assistance Bed Mobility: Rolling;Sidelying to Sit;Sit to Sidelying Rolling: Mod assist Sidelying to sit: Mod assist     Sit to sidelying: Mod assist General bed mobility comments: pt requires direct assist to maintain sit balance with initial help mod assist then min guard    Transfers Overall transfer level: Needs assistance Equipment used: None              Lateral/Scoot Transfers: Total assist General transfer comment: pt is too weak to scoot on the bed, unable to get to chair    Ambulation/Gait               General Gait Details: deferred  Stairs            Wheelchair Mobility    Modified Rankin (Stroke Patients Only)       Balance Overall balance assessment: Needs assistance Sitting-balance support: Feet supported Sitting balance-Leahy Scale: Poor                                       Pertinent Vitals/Pain Pain Assessment: No/denies pain    Home Living Family/patient expects to be discharged to:: Skilled nursing facility Living Arrangements: Non-relatives/Friends Available Help at Discharge: Friend(s);Available 24 hours/day Type of Home: House Home Access: Stairs to enter Entrance Stairs-Rails: Can reach both Entrance Stairs-Number of Steps: 4   Home Layout: One  level Home Equipment: Rollator (4 wheels) Additional Comments: home info from chart, pt unable to report    Prior Function Prior Level of Function : Needs assist;Other (comment);Patient poor historian/Family not available (pt cannot even recall when she last stood up)             Mobility Comments: assist to get to chair ADLs Comments: see OT note     Hand Dominance   Dominant Hand: Right    Extremity/Trunk Assessment   Upper Extremity Assessment Upper Extremity Assessment: Defer to OT evaluation    Lower  Extremity Assessment Lower Extremity Assessment: Generalized weakness RLE Deficits / Details: 3/5 RLE strength LLE Deficits / Details: L knee and hip were 2/5 strength, L ankle in PF contracture    Cervical / Trunk Assessment Cervical / Trunk Assessment: Kyphotic (mild)  Communication   Communication: Expressive difficulties  Cognition Arousal/Alertness: Awake/alert Behavior During Therapy: WFL for tasks assessed/performed;Anxious Overall Cognitive Status: Impaired/Different from baseline Area of Impairment: Problem solving;Awareness;Safety/judgement;Following commands;Memory;Attention;Orientation                 Orientation Level: Place;Time;Situation Current Attention Level: Selective Memory: Decreased recall of precautions;Decreased short-term memory Following Commands: Follows one step commands inconsistently;Follows one step commands with increased time Safety/Judgement: Decreased awareness of safety;Decreased awareness of deficits (did not remember L foot surgery) Awareness: Intellectual Problem Solving: Slow processing;Requires verbal cues;Requires tactile cues General Comments: delayed responses but delayed recall of any information        General Comments General comments (skin integrity, edema, etc.): pt was assisted to sit up on side of bed to control core, able with min guard at end of session.  pt has generalized weakness and low endurance to sit    Exercises     Assessment/Plan    PT Assessment Patient needs continued PT services  PT Problem List Decreased strength;Decreased range of motion;Decreased activity tolerance;Decreased balance;Decreased mobility;Decreased coordination;Decreased cognition;Decreased safety awareness;Cardiopulmonary status limiting activity;Decreased skin integrity       PT Treatment Interventions Functional mobility training;Therapeutic activities;Therapeutic exercise;Balance training;Neuromuscular re-education;Patient/family  education;Gait training;DME instruction    PT Goals (Current goals can be found in the Care Plan section)  Acute Rehab PT Goals Patient Stated Goal: to go to rehab PT Goal Formulation: With patient Time For Goal Achievement: 01/27/21 Potential to Achieve Goals: Fair    Frequency Min 2X/week   Barriers to discharge   came from rehab setting per pt    Co-evaluation               AM-PAC PT "6 Clicks" Mobility  Outcome Measure Help needed turning from your back to your side while in a flat bed without using bedrails?: A Lot Help needed moving from lying on your back to sitting on the side of a flat bed without using bedrails?: A Lot Help needed moving to and from a bed to a chair (including a wheelchair)?: A Lot Help needed standing up from a chair using your arms (e.g., wheelchair or bedside chair)?: Total Help needed to walk in hospital room?: Total Help needed climbing 3-5 steps with a railing? : Total 6 Click Score: 9    End of Session   Activity Tolerance: Patient tolerated treatment well Patient left: in bed;with call bell/phone within reach;with bed alarm set;Other (comment) (not up to chair as pt needs a shoe to protect L foot surgery) Nurse Communication: Mobility status PT Visit Diagnosis: Muscle weakness (generalized) (M62.81);Difficulty in walking, not elsewhere classified (R26.2);Other abnormalities of gait and mobility (  R26.89) Hemiplegia - Right/Left: Right Hemiplegia - dominant/non-dominant: Dominant Hemiplegia - caused by: Cerebral infarction    Time: 1050-1126 PT Time Calculation (min) (ACUTE ONLY): 36 min   Charges:   PT Evaluation $PT Eval Moderate Complexity: 1 Mod PT Treatments $Therapeutic Activity: 8-22 mins       Ramond Dial 01/13/2021, 12:56 PM  Mee Hives, PT PhD Acute Rehab Dept. Number: [place] [facility] [place]

## 2021-01-13 NOTE — Progress Notes (Signed)
Pt doesn't want to wear CPAP 

## 2021-01-13 NOTE — TOC Initial Note (Incomplete)
Transition of Care [facility]) - Initial/Assessment Note    Patient Details  Name: Autumn [name] MRN: [id number] Date of Birth: November 27, 1950  [facility]) CM/SW Contact:    Tresa Endo Phone Number: 01/13/2021, 6:22 PM  Clinical Narrative:                 CSW received SNF consult. CSW met with pt (include others in the room) at bedside. CSW introduced self and explained role at the hospital. Pt reports that PTA the (where they where living and with who). PT reports ( level of mobility and ADLS, if thy used equipment and what kind of equipment, I also ask if they drive). PT reports (any other relevant information) (if there's also a substance abuse consult I include that here as well)  CSW reviewed PT/OT recommendations for SNF. Pt reports (feelings toward recommendation). Pt gave CSW permission to fax out to facilities in the area. Pt has no preference of facility at this time. CSW gave pt medicare.gov rating list to review. PT reports they are (covid status, include if they have a booster or not)  CSW will continue to follow.    Expected Discharge Plan: Skilled Nursing Facility Barriers to Discharge: Continued Medical Work up   Patient Goals and CMS Choice Patient states their goals for this hospitalization and ongoing recovery are:: Rehab CMS Medicare.gov Compare Post Acute Care list provided to:: Patient Choice offered to / list presented to : Patient  Expected Discharge Plan and Services Expected Discharge Plan: [place] In-house Referral: Clinical Social Work   Post Acute Care Choice: [place] Living arrangements for the past 2 months: [place]                                      Prior Living Arrangements/Services Living arrangements for the past 2 months: [place] Lives with:: Facility Resident Patient language and need for interpreter reviewed:: Yes Do you feel safe going back  to the place where you live?: Yes      Need for Family Participation in Patient Care: Yes (Comment) Care giver support system in place?: Yes (comment)   Criminal Activity/Legal Involvement Pertinent to Current Situation/Hospitalization: No - Comment as needed  Activities of Daily Living Home Assistive Devices/Equipment: Eyeglasses ADL Screening (condition at time of admission) Patient's cognitive ability adequate to safely complete daily activities?: Yes Is the patient deaf or have difficulty hearing?: No Does the patient have difficulty seeing, even when wearing glasses/contacts?: No Does the patient have difficulty concentrating, remembering, or making decisions?: No Patient able to express need for assistance with ADLs?: Yes Does the patient have difficulty dressing or bathing?: Yes Independently performs ADLs?: No Communication: Independent Dressing (OT): Dependent Is this a change from baseline?: Pre-admission baseline Grooming: Dependent Is this a change from baseline?: Pre-admission baseline Feeding: Needs assistance Is this a change from baseline?: Pre-admission baseline Bathing: Dependent Is this a change from baseline?: Pre-admission baseline Toileting: Dependent Is this a change from baseline?: Pre-admission baseline In/Out Bed: Dependent Is this a change from baseline?: Pre-admission baseline Walks in Home: Dependent Is this a change from baseline?: Pre-admission baseline Does the patient have difficulty walking or climbing stairs?: Yes Weakness of Legs: Both Weakness of Arms/Hands: Both  Permission Sought/Granted Permission sought to share information with : Family Supports, Chartered certified accountant granted to share information with : Yes,  Verbal Permission Granted  Share Information with NAME: [name] (Friend)   [phone]  Permission granted to share info w AGENCY: [facility] and [place] granted to share info w Relationship:  [name] (Friend)   [phone]  Permission granted to share info w Contact Information: [name] (Friend)   [phone]  Emotional Assessment Appearance:: Appears stated age Attitude/Demeanor/Rapport: Unable to Assess Affect (typically observed): Unable to Assess Orientation: : Oriented to Self, Oriented to Place, Oriented to  Time, Oriented to Situation Alcohol / Substance Use: Not Applicable Psych Involvement: No (comment)  Admission diagnosis:  Chronic nonalcoholic liver disease [O77.0] Hypoxia [R09.02] AKI (acute kidney injury) ([place]) [N17.9] Osteomyelitis of foot, left, acute (HCC) [M86.172] Acute on chronic congestive heart failure, unspecified heart failure type ([place]) [I50.9] Pneumonia due to COVID-19 virus [U07.1, J12.82] COVID-19 [U07.1] Patient Active Problem List   Diagnosis Date Noted   COVID-19 01/09/2021   Nausea    Constipation    AF (paroxysmal atrial fibrillation) (HCC)    Aspergillus pneumonia (HCC)    Acute hypoxemic respiratory failure (HCC)    Left wrist pain    Acute stroke due to ischemia (HCC)    Neutropenia (HCC) 12/08/2020   Drug-induced neutropenia (HCC)    Normocytic anemia    Thrombocytopenia (HCC)    Bilateral carotid artery stenosis    Atrial fibrillation with rapid ventricular response (HCC)    Cellulitis and abscess of foot, except toes    Infectious tenosynovitis    Wheeze    AKI (acute kidney injury) ([place])    Anemia of chronic disease    Hypothyroidism    Atrial fibrillation with RVR ([place]) 11/09/2020   Osteomyelitis of foot, left, acute ([place]) 11/09/2020   CKD (chronic kidney disease), stage IV ([place]) 11/09/2020   Liver cirrhosis secondary to NASH (nonalcoholic steatohepatitis) ([place]) 11/09/2020   Hypertension    History of anemia due to CKD    Severe sepsis with septic shock (HCC)    Frequent falls    Diabetic ulcer of left midfoot associated with type 2 diabetes mellitus ([place]) 06/29/2020   Volume overload 05/20/2020    Moderate episode of recurrent major depressive disorder ([place]) 03/08/2020   Chronic right-sided heart failure (HCC) 08/22/2019   Orthostatic hypotension 11/07/2018   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 11/10/2017   Nephrotic syndrome 05/24/2017   Iron deficiency anemia due to chronic blood loss 08/30/2015   Risk for falls 04/19/2015   Edema of left lower extremity 02/15/2015   Vertigo 02/15/2015   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 [place] [address] Phone: [phone] Fax: [phone]     Social Determinants of Health (SDOH) Interventions    Readmission Risk Interventions No flowsheet data found.

## 2021-01-13 NOTE — Progress Notes (Signed)
HD#4 SUBJECTIVE:  Patient Summary: Autumn [name] is a 71 y.o. 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   Overnight Events: Had episodes of bloody bowel movements overnight, transfused 1u PRBC and post transfusion Hb 8.3.   Interim History: This is hospital day 4 for Autumn [name] who was seen and evaluated at the bedside this morning. She states that she feels more short of breath today and is having a hard time taking a deep breath. Patient notes that this seems to have come on all of a sudden and she was not exerting herself when her breathing worsened. She also noted that she had a few bowel movements overnight that were bloody, although she is unsure if it was bright red blood or dark.   OBJECTIVE:  Vital Signs: Vitals:   01/13/21 0400 01/13/21 0430 01/13/21 0500 01/13/21 0530  BP: (!) 143/97 (!) 141/115 133/79 133/83  Pulse: 98 (!) 105 (!) 109 97  Resp: 15 15 15 17   Temp: (!) 97.4 F (36.3 C)   (!) 97.5 F (36.4 C)  TempSrc: Oral   Oral  SpO2: 98% 97% 98% 94%  Weight: 87.2 kg     Height:       Supplemental O2: Nasal Cannula SpO2: 94 % O2 Flow Rate (L/min): 2 L/min FiO2 (%): 40 %  Filed Weights   01/11/21 0550 01/12/21 0459 01/13/21 0400  Weight: 89.4 kg 87.5 kg 87.2 kg     Intake/Output Summary (Last 24 hours) at 01/13/2021 4696 Last data filed at 01/13/2021 0457 Gross per 24 hour  Intake 890.5 ml  Output 575 ml  Net 315.5 ml   Net IO Since Admission: 935.63 mL [01/13/21 0611]  Physical Exam: General: Elderly female laying in bed. No acute distress. CV: Irregularly irregular. 1-2+ pitting edema bilateral lower extremities Pulmonary: Coarse breath sounds bilaterally, normal WOB on room air.  Abdominal: Soft, nontender, nondistended. Normal bowel sounds. 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  Neuro: A&Ox3. No focal deficit. Psych: Normal mood and affect    ASSESSMENT/PLAN:  Assessment: Principal Problem:   COVID-19   Plan: #Acute on chronic renal failure BUN/Cr remains elevated at 102 and 7.03 respectively, which has been slowing worsening since admission. GFR 6. Patient remains oliguric with 575 cc of urine output recorded in the last 24 hrs. Considering dialysis in the next day or two per nephro, as no improvement in kidney function has been seen.  - Nephro consulted, appreciate recs - Tunneled cath access needed for dialysis - Vein mapping for permanent access  - Strict I&Os; foley in place  - Avoid nephrotoxins   #UTI vs asymptomatic bacteruria  Pyuria seen on UA- minimal symptoms in the setting of AKI. Will treat with ceftriaxone 1 g for 5 days.  - Ceftriaxone day 4/5  #Acute hypoxic respiratory failure 2/2 COVID pneumonia SpO2 remains stable on room air. She is not in any respiratory distress and is able to speak in full sentences, although she feels more short of breath today. - Repeat CXR pending - Continue telemetry monitoring - Continuous pulse ox - Decadron 6 mg daily; day 5  #Chronic diastolic heart failure #Paroxysmal Afib #Hypertension HR improved, although still tachycardic in the 100s.  - Continue dilt 180 mg daily and  metoprolol 50 mg bid - Holding home torsemide  - Continue lipitor 40 mg  - Hold heparin  #Possible GI bleed #Anemia Anemia likely multifactorial with acute illness, chronic inflammation, and CKD contributing. Patient did have numerous episodes of dark, bloody bowel movements overnight. Hb 7.4 and received 1u PRBC. Post transfusion Hb 8.3. Last colonoscopy in November with polyps that were removed, EGD at that time showed gastritis.  - Consider EPO - Repeat CBC in PM  - Will consider GI consult and CTA if active bleeding continues - Hold heparin    #Type 2 diabetes A1c 4.2, likely falsely low in the setting of CKD. On victoza at home. 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  - Wound care consulted\  #Previous CVA - Continue Lipitor as above - Holding heparin as above - Dysphagia 3 diet   Best Practice: Diet: Dyshagia IVF: Fluids: none VTE: Place and maintain sequential compression device Start: 01/12/21 1731 Code: Full AB: Ceftriaxone Therapy Recs: Pending DISPO: Anticipated discharge to Home pending Medical stability.  Signature: Buddy Duty, D.O.  Internal Medicine Resident, PGY-1 [facility]  Pager: [phone] 6:11 AM, 01/13/2021   Please contact the on call pager after 5 pm and on weekends at [phone].

## 2021-01-13 NOTE — Progress Notes (Signed)
Patient had a bloody bowel movement. MD notified.

## 2021-01-13 NOTE — Progress Notes (Signed)
[facility] Progress Note   71 y.o. female with a past medical history significant for DM2, HTN, hypothyroidism, NASH cirrhosis, pAfib, CVA, admitted for COVID pneumonia c/b AKI on top of advanced CKD4.     Assessment/ Plan:   1) AKI on CKD 4: Baseline CKD likely arterionephrosclerosis with diabetic kidney disease and possibly some contribution of HRS given cirrhosis although less likely.    UA does show some protein but not extensive.  CKD has been progressive also with recent AKI during hospitalization for osteomyelitis.  Baseline creatinine around 3.6-4 at this time.   Now with AKI likely secondary to ATN associated with COVID.  Creatinine continues to slowly worsen with levels up to 7.  Patient  hesitant to start dialysis and I had to educate and counsel at length today. Finally she stated you don't know till you try and she is not quite ready to move towards comfort measures in the setting of worsening renal function.  Requested a tunneled catheter for initiation of dialysis; vein map, restrict the left arm. VVS for permanent access.   -Continue to monitor daily Cr, Dose meds for GFR -Monitor Daily I/Os, Daily weight  -Maintain MAP>65 for optimal renal perfusion.  -Avoid nephrotoxic medications including NSAIDs and Vanc/Zosyn combo   2) Acute hypoxic respiratory failure secondary to COVID-pneumonia: overall improved.  Continue management per primary team.     3) Anemia: Likely multifactorial with acute illness, chronic inflammation, CKD contributing.  Continue transfusion per primary team. Will check iron stores and consider ESA. May not respond to ESA with the inflammatory state   4) Metabolic acidosis: Mixed anion gap and non-anion gap likely secondary to acute kidney injury.  continue sodium bicarb to 1300 mg 3 times daily.  S/p IV bicarb with good response.   5) UTI: Pyuria on urinalysis.  Minimal symptoms were because of AKI we will treat.  Ceftriaxone 1 g x 5 days.      6) Atrial fibrillation: More tachycardic. Management per primary  7) Left foot wound: Status post amputation.  Management per primary  8) Diabetes Mellitus Type 2 with Hyperglycemia: Management per primary      Subjective:   C/o shortness of breath and anorexia + blood from rectum. Denies f/c.   Objective:   BP 135/78 (BP Location: Right Arm)    Pulse 99    Temp 97.6 F (36.4 C) (Oral)    Resp 14    Ht 5\' 7"  (1.702 m)    Wt 87.2 kg    SpO2 97%    BMI 30.11 kg/m   Intake/Output Summary (Last 24 hours) at 01/13/2021 9211 Last data filed at 01/13/2021 0457 Gross per 24 hour  Intake 890.5 ml  Output 575 ml  Net 315.5 ml   Weight change: -0.3 kg  Physical Exam: Constitutional: Chronically ill-appearing, lying in bed, no distress ENMT: ears and nose without scars or lesions, dry mucous membranes CV: tachy  Respiratory: Bilateral chest rise with no increased work of breathing Gastrointestinal: soft, non-tender, no palpable masses or hernias Skin: no visible lesions or rashes Psych: alert, judgement/insight appropriate, appropriate mood and affect Ext: 1+ edema b/l  Imaging: No results found.  Labs: BMET Recent Labs  Lab 01/09/21 1242 01/09/21 1336 01/10/21 0629 01/11/21 0635 01/12/21 0610 01/13/21 0056  NA 136 136 139 137 138 139  K 3.9 3.8 4.0 4.0 3.8 4.1  CL 106  --  108 107 108 104  CO2 14*  --  14* 14* 19* 19*  GLUCOSE 129*  --  112* 131* 117* 121*  BUN 83*  --  85* 95* 96* 102*  CREATININE 6.31*  --  6.65* 6.79* 6.98* 7.03*  CALCIUM 8.4*  --  8.8* 8.8* 8.4* 8.4*  PHOS  --   --  6.0* 5.5* 4.9* 5.3*   CBC Recent Labs  Lab 01/10/21 0629 01/10/21 1622 01/11/21 0635 01/12/21 0610 01/12/21 1646 01/13/21 0056  WBC 2.7*  --  6.8 4.4 7.0 11.7*  NEUTROABS 2.0  --  5.4 3.5  --  10.2*  HGB 6.7*   < > 8.1* 7.4* 7.7* 8.3*   8.3*  HCT 21.3*   < > 25.5* 23.6* 23.3* 25.4*   25.7*  MCV 94.2  --  89.8 89.7 89.3 86.7  PLT 132*  --  194 PLATELET CLUMPS NOTED ON  SMEAR, UNABLE TO ESTIMATE 127* 115*   < > = values in this interval not displayed.    Medications:     atorvastatin  40 mg Oral Daily   Chlorhexidine Gluconate Cloth  6 each Topical Daily   dexamethasone  6 mg Oral Daily   diltiazem  180 mg Oral Daily   feeding supplement  237 mL Oral TID BM   insulin aspart  0-15 Units Subcutaneous Q4H   levothyroxine  100 mcg Oral Q0600   metoprolol tartrate  50 mg Oral BID   multivitamin with minerals  1 tablet Oral Daily   pantoprazole  40 mg Oral BID   sodium bicarbonate  1,300 mg Oral TID      Otelia Santee, MD 01/13/2021, 8:07 AM

## 2021-01-13 NOTE — Progress Notes (Signed)
Orthopedic Tech Progress Note Patient Details:  Autumn [name] December 30, 1950 [id number]  Ortho Devices Type of Ortho Device: Postop shoe/boot Ortho Device/Splint Location: LLE Ortho Device/Splint Interventions: Ordered, Application, Adjustment   Post Interventions Patient Tolerated: Well Instructions Provided: Care of device  Janit Pagan 01/13/2021, 3:04 PM

## 2021-01-14 ENCOUNTER — Inpatient Hospital Stay (HOSPITAL_COMMUNITY): Payer: Medicare (Managed Care)

## 2021-01-14 DIAGNOSIS — K625 Hemorrhage of anus and rectum: Secondary | ICD-10-CM | POA: Diagnosis not present

## 2021-01-14 DIAGNOSIS — D638 Anemia in other chronic diseases classified elsewhere: Secondary | ICD-10-CM

## 2021-01-14 DIAGNOSIS — N186 End stage renal disease: Secondary | ICD-10-CM

## 2021-01-14 DIAGNOSIS — K648 Other hemorrhoids: Secondary | ICD-10-CM

## 2021-01-14 DIAGNOSIS — Z86010 Personal history of colonic polyps: Secondary | ICD-10-CM

## 2021-01-14 DIAGNOSIS — U071 COVID-19: Secondary | ICD-10-CM | POA: Diagnosis not present

## 2021-01-14 DIAGNOSIS — Z7901 Long term (current) use of anticoagulants: Secondary | ICD-10-CM

## 2021-01-14 HISTORY — PX: IR US GUIDE VASC ACCESS RIGHT: IMG2390

## 2021-01-14 HISTORY — PX: IR FLUORO GUIDE CV LINE RIGHT: IMG2283

## 2021-01-14 LAB — COMPREHENSIVE METABOLIC PANEL WITH GFR
ALT: 13 U/L (ref 0–44)
AST: 22 U/L (ref 15–41)
Albumin: 2.5 g/dL — ABNORMAL LOW (ref 3.5–5.0)
Alkaline Phosphatase: 53 U/L (ref 38–126)
Anion gap: 15 (ref 5–15)
BUN: 106 mg/dL — ABNORMAL HIGH (ref 8–23)
CO2: 20 mmol/L — ABNORMAL LOW (ref 22–32)
Calcium: 8.5 mg/dL — ABNORMAL LOW (ref 8.9–10.3)
Chloride: 105 mmol/L (ref 98–111)
Creatinine, Ser: 7.11 mg/dL — ABNORMAL HIGH (ref 0.44–1.00)
GFR, Estimated: 6 mL/min — ABNORMAL LOW
Glucose, Bld: 168 mg/dL — ABNORMAL HIGH (ref 70–99)
Potassium: 4.2 mmol/L (ref 3.5–5.1)
Sodium: 140 mmol/L (ref 135–145)
Total Bilirubin: 0.9 mg/dL (ref 0.3–1.2)
Total Protein: 4.7 g/dL — ABNORMAL LOW (ref 6.5–8.1)

## 2021-01-14 LAB — HEPATITIS B SURFACE ANTIBODY, QUANTITATIVE: Hep B S AB Quant (Post): 23.4 m[IU]/mL (ref >9.9)

## 2021-01-14 LAB — GLUCOSE, CAPILLARY
Glucose-Capillary: 104 mg/dL — ABNORMAL HIGH (ref 70–99)
Glucose-Capillary: 105 mg/dL — ABNORMAL HIGH (ref 70–99)
Glucose-Capillary: 126 mg/dL — ABNORMAL HIGH (ref 70–99)
Glucose-Capillary: 135 mg/dL — ABNORMAL HIGH (ref 70–99)
Glucose-Capillary: 148 mg/dL — ABNORMAL HIGH (ref 70–99)
Glucose-Capillary: 152 mg/dL — ABNORMAL HIGH (ref 70–99)

## 2021-01-14 LAB — CULTURE, BLOOD (ROUTINE X 2)
Culture: NO GROWTH
Culture: NO GROWTH
Special Requests: ADEQUATE
Special Requests: ADEQUATE

## 2021-01-14 LAB — CBC
HCT: 20.3 % — ABNORMAL LOW (ref 36.0–46.0)
HCT: 26.3 % — ABNORMAL LOW (ref 36.0–46.0)
Hemoglobin: 6.7 g/dL — CL (ref 12.0–15.0)
Hemoglobin: 8.8 g/dL — ABNORMAL LOW (ref 12.0–15.0)
MCH: 28.4 pg (ref 26.0–34.0)
MCH: 29.3 pg (ref 26.0–34.0)
MCHC: 33 g/dL (ref 30.0–36.0)
MCHC: 33.5 g/dL (ref 30.0–36.0)
MCV: 86 fL (ref 80.0–100.0)
MCV: 87.7 fL (ref 80.0–100.0)
Platelets: 102 10*3/uL — ABNORMAL LOW (ref 150–400)
Platelets: 122 10*3/uL — ABNORMAL LOW (ref 150–400)
RBC: 2.36 MIL/uL — ABNORMAL LOW (ref 3.87–5.11)
RBC: 3 MIL/uL — ABNORMAL LOW (ref 3.87–5.11)
RDW: 21.8 % — ABNORMAL HIGH (ref 11.5–15.5)
RDW: 19.9 % — ABNORMAL HIGH (ref 11.5–15.5)
WBC: 6.7 10*3/uL (ref 4.0–10.5)
WBC: 10.1 10*3/uL (ref 4.0–10.5)
nRBC: 0 % (ref 0.0–0.2)
nRBC: 0.2 % (ref 0.0–0.2)

## 2021-01-14 LAB — PROTIME-INR
INR: 1.3 — ABNORMAL HIGH (ref 0.8–1.2)
Prothrombin Time: 15.9 seconds — ABNORMAL HIGH (ref 11.4–15.2)

## 2021-01-14 LAB — MAGNESIUM: Magnesium: 2.1 mg/dL (ref 1.7–2.4)

## 2021-01-14 LAB — PHOSPHORUS: Phosphorus: 5.4 mg/dL — ABNORMAL HIGH (ref 2.5–4.6)

## 2021-01-14 LAB — PREPARE RBC (CROSSMATCH)

## 2021-01-14 LAB — HEPARIN LEVEL (UNFRACTIONATED): Heparin Unfractionated: 0.63 [IU]/mL (ref 0.30–0.70)

## 2021-01-14 MED ORDER — SODIUM CHLORIDE 0.9 % IV SOLN
20.0000 ug | Freq: Once | INTRAVENOUS | Status: AC
  Administered 2021-01-14: 20 ug via INTRAVENOUS
  Filled 2021-01-14: qty 5

## 2021-01-14 MED ORDER — HEPARIN SODIUM (PORCINE) 1000 UNIT/ML IJ SOLN
INTRAMUSCULAR | Status: AC | PRN
  Administered 2021-01-14: 3200 [IU] via INTRAVENOUS

## 2021-01-14 MED ORDER — PANTOPRAZOLE SODIUM 40 MG PO TBEC
40.0000 mg | DELAYED_RELEASE_TABLET | Freq: Two times a day (BID) | ORAL | Status: DC
  Administered 2021-01-14 – 2021-01-24 (×19): 40 mg via ORAL
  Filled 2021-01-14 (×20): qty 1

## 2021-01-14 MED ORDER — CEFAZOLIN SODIUM-DEXTROSE 2-4 GM/100ML-% IV SOLN
INTRAVENOUS | Status: AC
  Filled 2021-01-14: qty 100

## 2021-01-14 MED ORDER — PANTOPRAZOLE SODIUM 40 MG IV SOLR
40.0000 mg | Freq: Two times a day (BID) | INTRAVENOUS | Status: DC
  Administered 2021-01-14: 40 mg via INTRAVENOUS
  Filled 2021-01-14: qty 40

## 2021-01-14 MED ORDER — HEPARIN SODIUM (PORCINE) 1000 UNIT/ML IJ SOLN
INTRAMUSCULAR | Status: AC
  Filled 2021-01-14: qty 10

## 2021-01-14 MED ORDER — MIDODRINE HCL 5 MG PO TABS: 5.0000 mg | ORAL_TABLET | Freq: Three times a day (TID) | ORAL | Status: DC

## 2021-01-14 MED ORDER — FENTANYL CITRATE (PF) 100 MCG/2ML IJ SOLN
INTRAMUSCULAR | Status: AC | PRN
  Administered 2021-01-14: 25 ug via INTRAVENOUS

## 2021-01-14 MED ORDER — MIDAZOLAM HCL 2 MG/2ML IJ SOLN
INTRAMUSCULAR | Status: AC | PRN
  Administered 2021-01-14: .5 mg via INTRAVENOUS

## 2021-01-14 MED ORDER — SODIUM CHLORIDE 0.9% IV SOLUTION: Freq: Once | INTRAVENOUS | Status: DC

## 2021-01-14 MED ORDER — OCTREOTIDE LOAD VIA INFUSION
50.0000 ug | Freq: Once | INTRAVENOUS | Status: AC
Start: 2021-01-14 — End: 2021-01-14
  Administered 2021-01-14: 50 ug via INTRAVENOUS
  Filled 2021-01-14: qty 25

## 2021-01-14 MED ORDER — SODIUM CHLORIDE 0.9 % IV SOLN
2.0000 g | INTRAVENOUS | Status: DC
  Filled 2021-01-14: qty 20

## 2021-01-14 MED ORDER — LIDOCAINE-EPINEPHRINE 1 %-1:100000 IJ SOLN
INTRAMUSCULAR | Status: AC
  Administered 2021-01-14: 10 mL
  Filled 2021-01-14: qty 1

## 2021-01-14 MED ORDER — CEFAZOLIN SODIUM-DEXTROSE 2-4 GM/100ML-% IV SOLN
INTRAVENOUS | Status: AC | PRN
  Administered 2021-01-14: 2 g via INTRAVENOUS

## 2021-01-14 MED ORDER — MIDAZOLAM HCL 2 MG/2ML IJ SOLN
INTRAMUSCULAR | Status: AC
  Filled 2021-01-14: qty 2

## 2021-01-14 MED ORDER — SODIUM CHLORIDE 0.9 % IV SOLN
50.0000 ug/h | INTRAVENOUS | Status: DC
  Administered 2021-01-14: 50 ug/h via INTRAVENOUS
  Filled 2021-01-14: qty 1

## 2021-01-14 MED ORDER — FENTANYL CITRATE (PF) 100 MCG/2ML IJ SOLN
INTRAMUSCULAR | Status: AC
  Filled 2021-01-14: qty 2

## 2021-01-14 MED ORDER — HYDROCORTISONE ACETATE 25 MG RE SUPP
25.0000 mg | Freq: Two times a day (BID) | RECTAL | Status: DC
  Administered 2021-01-14 – 2021-01-19 (×5): 25 mg via RECTAL
  Filled 2021-01-14 (×24): qty 1

## 2021-01-14 NOTE — Consult Note (Addendum)
[facility] Consult: 12:34 PM 01/14/2021  LOS: 5 days    Referring Provider: Dr Damita Dunnings  Primary Care Physician:  Harlow Ohms, MD Primary Gastroenterologist:   Dr Andrey Farmer in [place].      Reason for Consultation:  anemia.     HPI: Autumn [name] is a 71 y.o. female.  PMH OSA.  Obesity.  Depression.  NIDDM.  Diabetic foot ulcers with osteomyelitis.  CKD stage IV/V.  NASH cirrhosis.  CKD.  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  Cerebral atrophy.   Bil, > 90% carotid artery disease by vascular study 11/22/2020.  A. fib, RVR new onset 10/2020.  NSTEMI.  Chronic Eliquis.  Hypothyroidism. Pancytopenia, hematology treated her with Neupogen, iron supplements.  Degenerative spine disease.  S/p hysterectomy.   2017 colonoscopy incomplete due to severe sigmoid angulation. 11/27/2020 EGD by Dr. Andrey Farmer at [facility].  For evaluation melena.  White, nummular esophageal lesions, brushings confirmed as fungal by KOH prep.  Gastritis.  Duodenal erythema. 11/29/2020 colonoscopy for hematochezia/BPR, melena.  Dr. Haig Prophet.  A total of 5 polyps, sized 310 mm in size removed from cecum, transverse, descending colon.  Sigmoid/descending colon diverticulosis.  Nonbleeding, grade 1 internal hemorrhoids.  Pathology showed TA polyps, 1 area of low-grade dysplasia present at cautery from polyp in the distal transverse colon.  No high-grade dysplasia, malignancy.   05/21/2020 MRI abdomen showed cirrhotic liver, no suspicious masses.  Multiple subcentimeter simple hepatic cysts.  Bile ducts normal.  Spleen enlarged at 14.6 cm.  Small volume ascites.  Mild, generalized body wall edema.  Colon diverticulosis.  Now admitted with worsening renal function and COVID-19 pneumonia.   Vascular consulted for placement of HD access. GI requested for eval of ongoing anemia, BPR.  Anemia has been a chronic issue for this patient.  7.0 on 11/12/20.  In early December it ranged between 8 and 9.  MCV normal in mid 80s.  3 PRBCs within first 2 weeks 11/2020.   Over course of last 6 days Hgb 6.7 ... 2 PRBC .Marland Kitchen  8.3 ... 6.7 .Marland Kitchen 1 PRBC .. 8.8. Platelets as low as 63 in Oct 2022, currently 122.   Ferritin 71.  Iron 102.  TIBC low at 221.  Iron sats elevated at 46%.  Folate, B12 normal. Patient had a bloody bowel movement yesterday morning. Progressive  late stage CKD w plans for initiation of hemodialysis. BNP 660 LFTs normal though albumin is low at 2.5.   INR 1.3, was as high as 3.4 6 days ago. Hgb A1c 4.2 two weeks ago.    Patient describes many weeks if not a few months of bleeding per rectum.  This has become more frequent and of larger volume since starting on Eliquis.  Yesterday she had an episode where she passed blood.  Does not necessarily have stool when she passes the blood.  No dysphagia though, lacking the bulk of her teeth, she is careful to chew her food well and eat soft foods.  No nausea, vomiting.  No  anorexia.  Was living with her best friend [name], Nancy's husband and Nancy's brother-in-law.  But since hospitalization in October/November has been at [facility].   No ETOH past or present.  No illicit drug use.  Previous smoker.  Past Medical History:  Diagnosis Date   Diabetes mellitus without complication ([place])    Hypertension    Kidney stone    Thyroid disease     Past Surgical History:  Procedure Laterality Date   ABDOMINAL HYSTERECTOMY     AMPUTATION Left 11/11/2020   Procedure: AMPUTATION RAY - 2nd metatarsal and 3rd metatarsal head;  Surgeon: [name], DPM;  Location: [facility] ORS;  Service: Podiatry;  Laterality: Left;   CAROTID ANGIOGRAPHY Left 11/22/2020   Procedure: CAROTID ANGIOGRAPHY;  Surgeon: Katha Cabal, MD;  Location: [facility];   Service: Cardiovascular;  Laterality: Left;   COLONOSCOPY WITH PROPOFOL N/A 11/29/2020   Procedure: COLONOSCOPY WITH PROPOFOL;  Surgeon: Lesly Rubenstein, MD;  Location: [facility] END[facility]COPY;  Service: Endoscopy;  Laterality: N/A;   ESOPHAGOGASTRODUODENOSCOPY (EGD) WITH PROPOFOL N/A 11/27/2020   Procedure: ESOPHAGOGASTRODUODENOSCOPY (EGD) WITH PROPOFOL;  Surgeon: Lesly Rubenstein, MD;  Location: [facility] ENDOSCOPY;  Service: Endoscopy;  Laterality: N/A;   IR FLUORO GUIDE CV LINE RIGHT  01/14/2021   IR US GUIDE VASC ACCESS RIGHT  01/14/2021   URETERAL STENT PLACEMENT      Prior to Admission medications   Medication Sig Start Date End Date Taking? Authorizing Provider  acetaminophen (TYLENOL) 325 MG tablet Take 325 mg by mouth every 6 (six) hours as needed.   Yes [provider]  albuterol (PROVENTIL) (2.5 MG/3ML) 0.083% nebulizer solution Take 2.5 mg by nebulization every 8 (eight) hours as needed for wheezing or shortness of breath.   Yes [provider]  albuterol (VENTOLIN HFA) 108 (90 Base) MCG/ACT inhaler Inhale 2 puffs into the lungs 4 (four) times daily as needed for wheezing. 03/25/18  Yes [provider]  allopurinol (ZYLOPRIM) 100 MG tablet Take 0.5 tablets (50 mg total) by mouth every other day. 12/22/20  Yes Wieting, Richard, MD  ALPRAZolam Duanne Moron) 0.25 MG tablet Take 1 tablet (0.25 mg total) by mouth 2 (two) times daily as needed for anxiety. 12/20/20  Yes Wieting, Richard, MD  apixaban (ELIQUIS) 5 MG TABS tablet Take 1 tablet (5 mg total) by mouth 2 (two) times daily. 12/20/20  Yes Wieting, Richard, MD  ascorbic acid (VITAMIN C) 500 MG tablet Take 1 tablet (500 mg total) by mouth 2 (two) times daily. 12/20/20  Yes Wieting, Richard, MD  atorvastatin (LIPITOR) 40 MG tablet Take 1 tablet (40 mg total) by mouth daily. 12/21/20  Yes Wieting, Richard, MD  dicyclomine (BENTYL) 10 MG capsule Take 1 capsule (10 mg total) by mouth 4 (four) times daily -  before meals and at  bedtime. 12/21/20  Yes Wieting, Richard, MD  diltiazem (CARDIZEM CD) 180 MG 24 hr capsule Take 1 capsule (180 mg total) by mouth daily. 12/21/20  Yes Wieting, Richard, MD  doxycycline (VIBRA-TABS) 100 MG tablet Take 100 mg by mouth 2 (two) times daily. 12/27/20  Yes [provider]  ferrous gluconate (FERGON) 324 MG tablet Take 1 tablet (324 mg total) by mouth daily with breakfast. 12/21/20  Yes Wieting, Richard, MD  hydrALAZINE (APRESOLINE) 100 MG tablet Take 1 tablet (100 mg total) by mouth every 8 (eight) hours. 12/20/20  Yes Wieting, Richard, MD  levofloxacin (LEVAQUIN) 500 MG tablet Take 500 mg by mouth daily.   Yes [provider]  levothyroxine (SYNTHROID) 100 MCG tablet Take 100 mcg by mouth every morning. 10/04/20  Yes [provider]  meclizine (ANTIVERT) 12.5 MG tablet Take 12.5 mg by mouth 3 (three) times daily as needed for dizziness. 08/30/20  Yes [provider]  metoprolol tartrate (LOPRESSOR) 50 MG tablet Take 1 tablet (50 mg total) by mouth 2 (two) times daily. 12/20/20  Yes Loletha Grayer, MD  metroNIDAZOLE (FLAGYL) 500 MG tablet  12/21/20  Yes [provider]  Multiple Vitamin (MULTIVITAMIN WITH MINERALS) TABS tablet Take 1 tablet by mouth daily. 12/20/20  Yes Wieting, Richard, MD  Nutritional Supplements (,FEEDING SUPPLEMENT, PROSOURCE PLUS) liquid Take 30 mLs by mouth 3 (three) times daily between meals. 12/20/20  Yes Wieting, Richard, MD  ondansetron (ZOFRAN) 4 MG tablet Place 1 tablet (4 mg total) into feeding tube every 6 (six) hours as needed for nausea or vomiting. 12/20/20  Yes Wieting, Richard, MD  pantoprazole (PROTONIX) 40 MG tablet Take 1 tablet (40 mg total) by mouth 2 (two) times daily. 12/20/20  Yes Wieting, Richard, MD  polyethylene glycol (MIRALAX / GLYCOLAX) 17 g packet Take 17 g by mouth daily as needed for severe constipation. 12/21/20  Yes Wieting, Richard, MD  predniSONE (DELTASONE) 20 MG tablet Take 20 mg by mouth 2  (two) times daily. Take two tablets (40mg  total) by mouth daily for 5 days   Yes [provider]  torsemide (DEMADEX) 20 MG tablet Take 1 tablet (20 mg total) by mouth daily. 12/21/20  Yes Wieting, Richard, MD  tuberculin (TUBERSOL) 5 UNIT/0.1ML injection Inject 0.1 mLs into the skin once. Once every 14 days   Yes [provider]  venlafaxine (EFFEXOR) 50 MG tablet Take 1 tablet (50 mg total) by mouth 2 (two) times daily. 12/20/20  Yes Wieting, Richard, MD  liraglutide (VICTOZA) 18 MG/3ML SOPN Inject into the skin. Patient not taking: Reported on 01/10/2021 10/07/18 07/03/21  [provider]    Scheduled Meds:  sodium chloride   Intravenous Once   atorvastatin  40 mg Oral Daily   Chlorhexidine Gluconate Cloth  6 each Topical Daily   Chlorhexidine Gluconate Cloth  6 each Topical Q0600   diltiazem  180 mg Oral Daily   feeding supplement  237 mL Oral TID BM   heparin sodium (porcine)       insulin aspart  0-15 Units Subcutaneous Q4H   levothyroxine  100 mcg Oral Q0600   metoprolol tartrate  50 mg Oral BID   midodrine  5 mg Oral TID WC   multivitamin with minerals  1 tablet Oral Daily   octreotide  50 mcg Intravenous Once   pantoprazole (PROTONIX) IV  40 mg Intravenous Q12H   sodium bicarbonate  1,300 mg Oral TID   Infusions:  sodium chloride     sodium chloride     desmopressin (DDAVP) IV for Bleeding     octreotide  (SANDOSTATIN)    IV infusion     PRN Meds: sodium chloride, sodium chloride, acetaminophen, albuterol, alteplase, heparin, lidocaine (PF), lidocaine-prilocaine, pentafluoroprop-tetrafluoroeth, senna-docusate   Allergies as of 01/09/2021 - Review Complete 01/09/2021  Allergen Reaction Noted   Codeine Nausea And Vomiting 02/08/2013    Family History  Problem Relation Age of Onset   Hypertension Mother     Social History   Socioeconomic History   Marital status: Single    Spouse name: Not on file   Number of children: Not on file    Years of education: Not on file  Highest education level: Not on file  Occupational History   Not on file  Tobacco Use   Smoking status: Former   Smokeless tobacco: Never  Substance and Sexual Activity   Alcohol use: Never   Drug use: Never   Sexual activity: Not on file  Other Topics Concern   Not on file  Social History Narrative   Not on file   Social Determinants of Health   Financial Resource Strain: Not on file  Food Insecurity: Not on file  Transportation Needs: Not on file  Physical Activity: Not on file  Stress: Not on file  Social Connections: Not on file  Intimate Partner Violence: Not on file    REVIEW OF SYSTEMS: Constitutional: Essentially nonambulatory for the past several weeks. ENT:  No nose bleeds Pulm: Shortness of breath and cough have improved. CV:  No palpitations, no LE edema.  Chronic left upper extremity swelling. GU:  No hematuria, no frequency GI: Per HPI. Heme: Patient bruises easily. Transfusions: See HPI.  She does not recall transfusions prior to October 2022. Neuro:  No headaches, no peripheral tingling or numbness.  No syncope, no seizures. Derm:  No itching, no rash or sores.  Endocrine:  No sweats or chills.  No polyuria or dysuria Immunization: Has been vaccinated against COVID-19. Travel:  None beyond local counties in last few months.    PHYSICAL EXAM: Vital signs in last 24 hours: Vitals:   01/14/21 0925 01/14/21 0950  BP: (!) 147/125 (!) 155/101  Pulse: (!) 116 (!) 111  Resp: 18   Temp:    SpO2: 100%    Wt Readings from Last 3 Encounters:  01/13/21 87.2 kg  12/21/20 96.2 kg  09/02/18 94.3 kg    General: Obese, chronically ill-appearing.  Comfortable. Head: No facial asymmetry or swelling.  No signs of head trauma. Eyes: Conjunctiva pale.  No scleral icterus. Ears: Not hard of hearing Nose: No congestion or discharge Mouth: Very few native teeth remain.  Tongue is midline.  Mucosa is pink, moist, clear. Neck: No  JVD, no masses, no thyromegaly.  HD catheter on right. Lungs: No increased work of breathing.  No cough.  Breath sounds diminished at the bases. Heart: Irregularly irregular. Abdomen: Obese, soft.  No tenderness.  Bowel sounds active.  Do not appreciate HSM, masses, bruits, hernias..   Rectal: Did not perform DRE but visually inspected rectum.  No visible hemorrhoids or blood. Musc/Skeltl: No joint redness, swelling or gross deformities. Extremities: No CCE.  Wound VAC on dorsal right foot.  Missing third and fourth toes on right Neurologic: Appropriate.  Oriented x3.  Some expressive aphasia and slowing of speech.. Skin: Purpura on the arms, some bruising on the right upper chest/lower neck. Nodes: No cervical adenopathy. Psych: Affect blunted though pleasant, cooperative.  Intake/Output from previous day: 01/02 0701 - 01/03 0700 In: 616.4 [P.O.:100; Blood:396; IV Piggyback:120.4] Out: 350 [Urine:350] Intake/Output this shift: No intake/output data recorded.  LAB RESULTS: Recent Labs    01/13/21 1131 01/14/21 0036 01/14/21 1020  WBC 8.2 6.7 10.1  HGB 7.6* 6.7* 8.8*  HCT 23.2* 20.3* 26.3*  PLT 114* 102* 122*   BMET Lab Results  Component Value Date   NA 140 01/14/2021   NA 139 01/13/2021   NA 138 01/12/2021   K 4.2 01/14/2021   K 4.1 01/13/2021   K 3.8 01/12/2021   CL 105 01/14/2021   CL 104 01/13/2021   CL 108 01/12/2021   CO2 20 (L) 01/14/2021  CO2 19 (L) 01/13/2021   CO2 19 (L) 01/12/2021   GLUCOSE 168 (H) 01/14/2021   GLUCOSE 121 (H) 01/13/2021   GLUCOSE 117 (H) 01/12/2021   BUN 106 (H) 01/14/2021   BUN 102 (H) 01/13/2021   BUN 96 (H) 01/12/2021   CREATININE 7.11 (H) 01/14/2021   CREATININE 7.03 (H) 01/13/2021   CREATININE 6.98 (H) 01/12/2021   CALCIUM 8.5 (L) 01/14/2021   CALCIUM 8.4 (L) 01/13/2021   CALCIUM 8.4 (L) 01/12/2021   LFT Recent Labs    01/12/21 0610 01/13/21 0056 01/14/21 0036  PROT 5.2* 4.9* 4.7*  ALBUMIN 2.8* 2.7* 2.5*  AST 16 19  22   ALT 9 10 13   ALKPHOS 52 53 53  BILITOT 0.6 0.8 0.9   PT/INR Lab Results  Component Value Date   INR 3.4 (H) 01/09/2021   INR 1.3 (H) 11/22/2020   INR 1.3 (H) 11/09/2020   Hepatitis Panel Recent Labs    01/13/21 0056  HEPBSAG NON REACTIVE   C-Diff No components found for: CDIFF Lipase     Component Value Date/Time   LIPASE 44 03/30/2017 1948    Drugs of Abuse  No results found for: LABOPIA, COCAINSCRNUR, LABBENZ, AMPHETMU, THCU, LABBARB   RADIOLOGY STUDIES: IR Fluoro Guide CV Line Right  Result Date: 01/14/2021 INDICATION: Long-term dialysis access EXAM: 1. Ultrasound-guided puncture of the right internal jugular vein 2. Placement of a tunneled hemodialysis catheter using fluoroscopic guidance MEDICATIONS: None ANESTHESIA/SEDATION: Moderate (conscious) sedation was employed during this procedure. A total of Versed 0.5 mg and Fentanyl 25 mcg was administered intravenously. Moderate Sedation Time: 22 minutes. [name] level of consciousness and vital signs were monitored continuously by radiology nursing throughout the procedure under my direct supervision. FLUOROSCOPY TIME:  Fluoroscopy Time: 2 minutes 8 seconds with 2 exposures COMPLICATIONS: None immediate. PROCEDURE: Informed written consent was obtained from the patient after a thorough discussion of the procedural risks, benefits and alternatives. All questions were addressed. Maximal Sterile Barrier Technique was utilized including caps, mask, sterile gowns, sterile gloves, sterile drape, hand hygiene and skin antiseptic. A timeout was performed prior to the initiation of the procedure. The patient was placed supine on the exam table. The right neck and chest was prepped and draped in the standard sterile fashion. Ultrasound was used to evaluate the right internal jugular vein, which found to be patent and suitable for access. An ultrasound image was permanently stored in the electronic medical record. Using ultrasound  guidance, the right internal jugular vein was directly punctured using a 21 gauge micropuncture set. An 018 wire was advanced into the SVC, followed by serial tract dilation and placement of an 035 wire which was advanced into the IVC. Attention was then turned to an infraclavicular location, where a small dermatotomy was made after the administration of additional local anesthetic. From this location, a 19 cm tip-to-cuff hemodialysis catheter was tunneled to the venotomy site. A peel-away sheath was then advanced over the access wire. Through this peel-away sheath, the hemodialysis catheter was advanced into the central veins, such that the tip was positioned near the superior cavoatrial junction. The line was found to flush and aspirate appropriately. It was sutured to the skin using 0 Prolene suture, and the venotomy was closed with Dermabond. A sterile dressing was placed. The patient tolerated the procedure well without immediate complication. IMPRESSION: Successful placement of a tunneled hemodialysis catheter via the right internal jugular vein. The line is ready for immediate use. Electronically Signed   By: Murrell Redden  El-Abd M.D.   On: 01/14/2021 10:21   IR US Guide Vasc Access Right  Result Date: 01/14/2021 INDICATION: Long-term dialysis access EXAM: 1. Ultrasound-guided puncture of the right internal jugular vein 2. Placement of a tunneled hemodialysis catheter using fluoroscopic guidance MEDICATIONS: None ANESTHESIA/SEDATION: Moderate (conscious) sedation was employed during this procedure. A total of Versed 0.5 mg and Fentanyl 25 mcg was administered intravenously. Moderate Sedation Time: 22 minutes. [name] level of consciousness and vital signs were monitored continuously by radiology nursing throughout the procedure under my direct supervision. FLUOROSCOPY TIME:  Fluoroscopy Time: 2 minutes 8 seconds with 2 exposures COMPLICATIONS: None immediate. PROCEDURE: Informed written consent was  obtained from the patient after a thorough discussion of the procedural risks, benefits and alternatives. All questions were addressed. Maximal Sterile Barrier Technique was utilized including caps, mask, sterile gowns, sterile gloves, sterile drape, hand hygiene and skin antiseptic. A timeout was performed prior to the initiation of the procedure. The patient was placed supine on the exam table. The right neck and chest was prepped and draped in the standard sterile fashion. Ultrasound was used to evaluate the right internal jugular vein, which found to be patent and suitable for access. An ultrasound image was permanently stored in the electronic medical record. Using ultrasound guidance, the right internal jugular vein was directly punctured using a 21 gauge micropuncture set. An 018 wire was advanced into the SVC, followed by serial tract dilation and placement of an 035 wire which was advanced into the IVC. Attention was then turned to an infraclavicular location, where a small dermatotomy was made after the administration of additional local anesthetic. From this location, a 19 cm tip-to-cuff hemodialysis catheter was tunneled to the venotomy site. A peel-away sheath was then advanced over the access wire. Through this peel-away sheath, the hemodialysis catheter was advanced into the central veins, such that the tip was positioned near the superior cavoatrial junction. The line was found to flush and aspirate appropriately. It was sutured to the skin using 0 Prolene suture, and the venotomy was closed with Dermabond. A sterile dressing was placed. The patient tolerated the procedure well without immediate complication. IMPRESSION: Successful placement of a tunneled hemodialysis catheter via the right internal jugular vein. The line is ready for immediate use. Electronically Signed   By: Albin Felling M.D.   On: 01/14/2021 10:21   DG CHEST PORT 1 VIEW  Result Date: 01/13/2021 CLINICAL DATA:  Dyspnea.  COVID  positive. EXAM: PORTABLE CHEST 1 VIEW COMPARISON:  01/09/2021. FINDINGS: Interstitial hazy airspace lung opacities have improved from the prior study. Mild residual opacities are most noted on the right. No new lung abnormalities. No convincing pleural effusion.  No pneumothorax. Cardiac silhouette is normal in size. IMPRESSION: 1. Interval improvement in interstitial airspace lung opacities. This is consistent with improved multifocal pneumonia given the history of COVID-19 positive status. Electronically Signed   By: Lajean Manes M.D.   On: 01/13/2021 09:15      IMPRESSION:   Painless hematochezia/bleeding per rectum.  Suspect this is hemorrhoidal in nature.     Chronic anemia.  This is anemia of CKD, and other chronic illnesses.  On po iron at home.  Required 3 PRBCs within the first couple of weeks of November 2022.  2 PRBCs during this current admission.  FOBT status not determined.  At EGD and colonoscopy 6 weeks ago Dr. Haig Prophet noted Candida I will esophagitis, gastritis, duodenal erythema and removed multiple tubular adenomatous polyps from the colon.  Non-bleeding, non-prolapsed hemorrhoids noted.      NASH cirrhosis.  No evidence for portal hypertensive gastropathy or gastro esophageal varices on recent EGD. Hep B immune by serologies 11/2.    Progressive advanced CKD.  Anticipating need to begin HD soon.  Vasc surgery completing pre-op eval for placement of fistula.  Temp HD catheter placed by IR today.       COVID 19 PNA.  Latest x-ray shows improvement of multifocal pneumonia.  Completed 5 days of Decadron.    CVA early 11/2020 following toe amputations.    Afib.  RVR in 11/2020.  Was on Eliquis, switched to heparin which is now discontinued in setting of anemia.  Coagulopathy, INR has gone from 3.4 to 1.3 over the course of the admission.    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      Pyuria vs asymptomatic bacteriuria.  Day 5/5 ceftriaxone.    PLAN:      Case discussed with Dr. Henrene Pastor.  Adding hydrocortisone rectal suppositories.  Stopping octreotide.  Switching Protonix back to 40 mg po bid.    Adding hydrocortisone suppositories.  Note that she is currently NPO.  From a GI perspective this is not necessary.  Recommend reinitiating solid diet (carb modified/renal) but defer decision to attending medical team.   Autumn [name]  01/14/2021, 12:34 PM Phone [phone]  GI ATTENDING  History, laboratories, x-rays, prior endoscopy reports, pathology reviewed.  Patient seen and examined.  Agree with comprehensive consultation note as outlined above.  Extremely complicated patient with MULTIPLE SIGNIFICANT medical problems.  Her chronic anemia is MULTIFACTORIAL (chronic renal insufficiency, diabetes, chronic osteomyelitis, cirrhosis, pancytopenia seen by hematology).  She has had GI work-up as outlined above.  No significant abnormalities.  Small polyps.  She reports minor intermittent rectal bleeding.  Known hemorrhoids on colonoscopy.  Her history is consistent with minor intermittent rectal bleeding (worse with anticoagulation) secondary to known internal hemorrhoids.  At this point, do not recommend repeating extensive GI work-up.  Rather, treat for hemorrhoids with medicated suppositories as ordered.  Continue to monitor her stools over time.  I would expect things to improve within a week or so.  If not, she may require diagnostic flexible sigmoidoscopy.  We are available as needed.  Thanks  Docia Chuck. Geri Seminole., M.D. [facility]

## 2021-01-14 NOTE — Progress Notes (Signed)
Upper extremity vein mapping has been completed.   Preliminary results in CV Proc.   Autumn [name] 01/14/2021 2:37 PM

## 2021-01-14 NOTE — Progress Notes (Signed)
HD#5 SUBJECTIVE:  Patient Summary: Autumn [name] is a 71 y.o. 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   Overnight Events: Patient continued to have further episodes of bloody bowel movements. Hb dropped to 6.7, although patient remained hemodynamically stable. Transfused 1u PRBC.   Interim History: This is hospital day 5 for Autumn [name] who was seen and evaluated at the bedside this morning. The patient had her tunneled dialysis catheter placed this morning and feels well. She continues to have dark, bloody bowel movements with 2 episodes noted overnight. The patient denies any weakness or lightheadedness, but does note that she feels fatigued.   OBJECTIVE:  Vital Signs: Vitals:   01/14/21 0229 01/14/21 0310 01/14/21 0315 01/14/21 0400  BP: (!) 134/99 (!) 141/106  (!) 150/94  Pulse: 86 78 96 (!) 111  Resp: 12 15  13   Temp: 97.6 F (36.4 C) 97.6 F (36.4 C)    TempSrc: Oral Oral    SpO2: 99% 96%  99%  Weight:      Height:       Supplemental O2: Nasal Cannula SpO2: 99 % O2 Flow Rate (L/min): 2 L/min FiO2 (%): 40 %  Filed Weights   01/11/21 0550 01/12/21 0459 01/13/21 0400  Weight: 89.4 kg 87.5 kg 87.2 kg     Intake/Output Summary (Last 24 hours) at 01/14/2021 0548 Last data filed at 01/14/2021 0110 Gross per 24 hour  Intake 220.44 ml  Output 350 ml  Net -129.56 ml   Net IO Since Admission: 806.07 mL [01/14/21 0548]  Physical Exam: General: Chronically ill appearing female laying in bed. No acute distress. CV: Tachycardic rate, irregular rhythm, no murmurs Pulmonary: Diminished breath sounds in bilateral lung bases, with normal work of breathing on 2 L [place]  Abdominal: Soft, nontender, nondistended. Normal bowel sounds. Extremities: Palpable radial pulses. 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  Neuro: A&Ox3. No focal deficit. 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 BUN/Cr elevated to 106 and 7.11 respectively, which has been slowing worsening since admission. GFR stable at 6. Patient remains oliguric with worsening urine output, only 350 cc recorded in the last 24 hrs. Vascular surgery consulted yesterday for initiation of dialysis access.  - Nephro consulted, appreciate recs - Vascular surgery consulted for permanent access placement, will defer in setting of ongoing GI bleed - Start DDVAP 20 mcg  - Strict I&Os; foley remains in place - Avoid nephrotoxins   #Acute hypoxic respiratory failure 2/2 COVID pneumonia Overall remains clinically stable from a respiratory standpoint. SpO2 >90% on room air to 2 L [place]. Repeat CXR yesterday with improvement in interstitial airspace lung opacities, consistent with improved multifocal pneumonia.  - Continuous pulse ox and telemetry - Decadron 6 mg daily (day 6); will discontinue after today   #Acute on chronic anemia #Upper GI bleed, suspected Patient continues to have dark, bloody bowel movements. Had Hb drop of 6.7 overnight, although patient remained hemodynamically stable. S/p 1u PRBC last night, with 3u PRBC total transfused this admission. Last cscope/EGD in November with colonic polyps removed and gastritis noted. Ongoing bleeding could also be secondary  to uremia, as some active bleeding was noted at tunneled cath site, as well.  - GI consulted, appreciate recs - Switch protonix to 40 mg IV bid - Start octreotide 50 mcg bolus, followed by 50 mcg/hr infusion - Midodrine 5 mg tid  - Continue to monitor CBC - Continue to hold heparin  #Thrombocytopenia Thrombocytopenia is likely in the setting  of uremia. Will treat uremia with DDAVP as noted above.  - Monitor CBC  #UTI vs asymptomatic bacteruria  Pyuria seen on UA, although minimal symptoms noted in the setting of AKI. Will treat with ceftriaxone 1 g for 5 days.  - Ceftriaxone day 5/5  #Chronic diastolic heart failure #Paroxysmal Afib #Hypertension Patient remains in Afib and HR has been in the 90-100s.  - Continue dilt 180 mg and metoprolol 50 mg bid - Hold torsemide  - Continue lipitor 40 mg  #Type 2 diabetes A1c 4.2, likely falsely low in the setting of CKD. Only on Victoza at home. Required 10u of SSI in the last 24 hrs. - Continue SSI  Best Practice: Diet: NPO IVF: Fluids: none VTE: Place and maintain sequential compression device Start: 01/12/21 1731 Code: Full AB: Ceftriaxone day 5/5 Therapy Recs: SNF, DME: none DISPO: Anticipated discharge to Skilled nursing facility pending Medical stability.  Signature: Buddy Duty, D.O.  Internal Medicine Resident, PGY-1 [facility]  Pager: [phone] 5:48 AM, 01/14/2021   Please contact the on call pager after 5 pm and on weekends at [phone].

## 2021-01-14 NOTE — TOC Progression Note (Signed)
Transition of Care [facility]) - Progression Note    Patient Details  Name: Autumn [name] MRN: [id number] Date of Birth: 1950/09/29  [facility] of [facility]) CM/SW Contact  Reece Agar, [place] Phone Number: 01/14/2021, 6:16 PM  Clinical Narrative:    CSW contacted [facility] to inquire about pt being able to return with HD. There was no answer CSW left a message and will follow up in the morning.    Expected Discharge Plan: [place] Barriers to Discharge: Continued Medical Work up  Expected Discharge Plan and Services Expected Discharge Plan: [place] In-house Referral: Clinical Social Work   Post Acute Care Choice: [place] Living arrangements for the past 2 months: [place]                                       Social Determinants of Health (SDOH) Interventions    Readmission Risk Interventions No flowsheet data found.

## 2021-01-14 NOTE — Progress Notes (Signed)
Pt stated she doesn't want to wear CPAP

## 2021-01-14 NOTE — Progress Notes (Addendum)
Per [place] Pa, ok to continue actionoctreotide until the bag ran out. Will continue to monitor.

## 2021-01-14 NOTE — Progress Notes (Signed)
[facility] Progress Note   71 y.o. female with a past medical history significant for DM2, HTN, hypothyroidism, NASH cirrhosis, pAfib, CVA, admitted for COVID pneumonia c/b AKI on top of advanced CKD4.     Assessment/ Plan:   1) AKI on CKD 4: Baseline CKD likely arterionephrosclerosis with diabetic kidney disease and possibly some contribution of HRS given cirrhosis although less likely.    UA does show some protein but not extensive.  CKD has been progressive also with recent AKI during hospitalization for osteomyelitis.  Baseline creatinine around 3.6-4 at this time.   Now with AKI likely secondary to ATN associated with COVID.  Creatinine continues to slowly worsen with levels >7.  Patient  hesitant to start dialysis but finally agreed to it stating you don't know till you try.   Requested a tunneled catheter for initiation of dialysis; vein map, restrict the left arm. Appreciate Dr. Carlis Abbott VVS seeing the patient in preparation for permanent access.    Appreciate VIR seeing for TC. Will start dialysis when she has access.  -Continue to monitor daily Cr, Dose meds for GFR -Monitor Daily I/Os, Daily weight  -Maintain MAP>65 for optimal renal perfusion.  -Avoid nephrotoxic medications including NSAIDs and Vanc/Zosyn combo   2) Acute hypoxic respiratory failure secondary to COVID-pneumonia: overall improved.  Continue management per primary team.     3) Anemia: Likely multifactorial with acute illness, chronic inflammation, CKD contributing.  Continue transfusion per primary team. Will check iron stores and consider ESA. May not respond to ESA with the inflammatory state - Still has blood per rectum and will need transfusion today.   4) Metabolic acidosis: Mixed anion gap and non-anion gap likely secondary to acute kidney injury.  continue sodium bicarb to 1300 mg 3 times daily.  S/p IV bicarb with good response.   5) UTI: Pyuria on urinalysis.  Minimal symptoms were because  of AKI we will treat.  Ceftriaxone 1 g x 5 days.     6) Atrial fibrillation: More tachycardic. Management per primary  7) Left foot wound: Status post amputation.  Management per primary  8) Diabetes Mellitus Type 2 with Hyperglycemia: Management per primary      Subjective:   Denies  shortness of breath and cough but still very poor appetitie.  Still has blood from rectum. Denies f/c.   Objective:   BP (!) 150/90 (BP Location: Right Arm)    Pulse (!) 125    Temp (!) 97.5 F (36.4 C) (Oral)    Resp 15    Ht 5\' 7"  (1.702 m)    Wt 87.2 kg    SpO2 97%    BMI 30.11 kg/m   Intake/Output Summary (Last 24 hours) at 01/14/2021 6295 Last data filed at 01/14/2021 2841 Gross per 24 hour  Intake 616.44 ml  Output 350 ml  Net 266.44 ml   Weight change:   Physical Exam: Constitutional: Chronically ill-appearing, lying in bed, no distress ENMT: ears and nose without scars or lesions, dry mucous membranes CV: tachy  Respiratory: Bilateral chest rise with no increased work of breathing Gastrointestinal: soft, non-tender, no palpable masses or hernias Skin: no visible lesions or rashes Psych: alert, judgement/insight appropriate, appropriate mood and affect Ext: 1+ edema b/l  Imaging: DG CHEST PORT 1 VIEW  Result Date: 01/13/2021 CLINICAL DATA:  Dyspnea.  COVID positive. EXAM: PORTABLE CHEST 1 VIEW COMPARISON:  01/09/2021. FINDINGS: Interstitial hazy airspace lung opacities have improved from the prior study. Mild residual opacities are most  noted on the right. No new lung abnormalities. No convincing pleural effusion.  No pneumothorax. Cardiac silhouette is normal in size. IMPRESSION: 1. Interval improvement in interstitial airspace lung opacities. This is consistent with improved multifocal pneumonia given the history of COVID-19 positive status. Electronically Signed   By: Lajean Manes M.D.   On: 01/13/2021 09:15    Labs: BMET Recent Labs  Lab 01/09/21 1242 01/09/21 1336 01/10/21 0629  01/11/21 0635 01/12/21 0610 01/13/21 0056 01/14/21 0036  NA 136 136 139 137 138 139 140  K 3.9 3.8 4.0 4.0 3.8 4.1 4.2  CL 106  --  108 107 108 104 105  CO2 14*  --  14* 14* 19* 19* 20*  GLUCOSE 129*  --  112* 131* 117* 121* 168*  BUN 83*  --  85* 95* 96* 102* 106*  CREATININE 6.31*  --  6.65* 6.79* 6.98* 7.03* 7.11*  CALCIUM 8.4*  --  8.8* 8.8* 8.4* 8.4* 8.5*  PHOS  --   --  6.0* 5.5* 4.9* 5.3* 5.4*   CBC Recent Labs  Lab 01/10/21 0629 01/10/21 1622 01/11/21 0635 01/12/21 0610 01/12/21 1646 01/13/21 0056 01/13/21 1131 01/14/21 0036  WBC 2.7*  --  6.8 4.4 7.0 11.7* 8.2 6.7  NEUTROABS 2.0  --  5.4 3.5  --  10.2*  --   --   HGB 6.7*   < > 8.1* 7.4* 7.7* 8.3*   8.3* 7.6* 6.7*  HCT 21.3*   < > 25.5* 23.6* 23.3* 25.4*   25.7* 23.2* 20.3*  MCV 94.2  --  89.8 89.7 89.3 86.7 86.9 86.0  PLT 132*  --  194 PLATELET CLUMPS NOTED ON SMEAR, UNABLE TO ESTIMATE 127* 115* 114* 102*   < > = values in this interval not displayed.    Medications:     sodium chloride   Intravenous Once   atorvastatin  40 mg Oral Daily   Chlorhexidine Gluconate Cloth  6 each Topical Daily   Chlorhexidine Gluconate Cloth  6 each Topical Q0600   dexamethasone  6 mg Oral Daily   diltiazem  180 mg Oral Daily   feeding supplement  237 mL Oral TID BM   insulin aspart  0-15 Units Subcutaneous Q4H   levothyroxine  100 mcg Oral Q0600   metoprolol tartrate  50 mg Oral BID   multivitamin with minerals  1 tablet Oral Daily   pantoprazole  40 mg Oral BID   sodium bicarbonate  1,300 mg Oral TID      Otelia Santee, MD 01/14/2021, 7:14 AM

## 2021-01-14 NOTE — Plan of Care (Signed)
  Problem: Education: Goal: Knowledge of General Education information will improve Description Including pain rating scale, medication(s)/side effects and non-pharmacologic comfort measures Outcome: Progressing   Problem: Health Behavior/Discharge Planning: Goal: Ability to manage health-related needs will improve Outcome: Progressing   

## 2021-01-14 NOTE — Care Management Important Message (Signed)
Important Message  Patient Details  Name: MAZIKEEN HEHN MRN: [id number] Date of Birth: 1950/07/04   Medicare Important Message Given:  Yes     [name] 01/14/2021, 11:01 AM

## 2021-01-14 NOTE — Procedures (Signed)
Interventional Radiology Procedure Note  Date of Procedure: 01/14/2021  Procedure: Tunneled HD line  Findings:  1. Tunneled HD placement via right IJ   Complications: No immediate complications noted.   Estimated Blood Loss: minimal  Follow-up and Recommendations: 1. Ready for use    Albin Felling, MD  Vascular & Interventional Radiology  01/14/2021 11:28 AM

## 2021-01-14 NOTE — Consult Note (Signed)
Chief Complaint: Patient was seen in consultation today for  Chief Complaint  Patient presents with   Pneumonia    Referring Physician(s): Dr. Augustin Coupe   Supervising Physician: Aletta Edouard  Patient Status: [facility] - In-pt  History of Present Illness: Autumn [name] is a 71 y.o. female with a medical history significant for DM2, HTN, NASH cirrhosis, chronic kidney disease stage IV and paroxysmal atrial fibrillation. She was admitted to the hospital 01/09/21 with Covid pneumonia complicated by acute on chronic kidney disease. Her creatinine levels have continued to worsen and are now >7. The nephrology team is preparing her for hemodialysis.  Interventional Radiology has been asked to evaluate this patient for an image-guided tunneled dialysis catheter.   Past Medical History:  Diagnosis Date   Diabetes mellitus without complication ([place])    Hypertension    Kidney stone    Thyroid disease     Past Surgical History:  Procedure Laterality Date   ABDOMINAL HYSTERECTOMY     AMPUTATION Left 11/11/2020   Procedure: AMPUTATION RAY - 2nd metatarsal and 3rd metatarsal head;  Surgeon: [name], DPM;  Location: [facility] ORS;  Service: Podiatry;  Laterality: Left;   CAROTID ANGIOGRAPHY Left 11/22/2020   Procedure: CAROTID ANGIOGRAPHY;  Surgeon: Katha Cabal, MD;  Location: [facility];  Service: Cardiovascular;  Laterality: Left;   COLONOSCOPY WITH PROPOFOL N/A 11/29/2020   Procedure: COLONOSCOPY WITH PROPOFOL;  Surgeon: Lesly Rubenstein, MD;  Location: [facility] ENDOSCOPY;  Service: Endoscopy;  Laterality: N/A;   ESOPHAGOGASTRODUODENOSCOPY (EGD) WITH PROPOFOL N/A 11/27/2020   Procedure: ESOPHAGOGASTRODUODENOSCOPY (EGD) WITH PROPOFOL;  Surgeon: Lesly Rubenstein, MD;  Location: [facility] ENDOSCOPY;  Service: Endoscopy;  Laterality: N/A;   URETERAL STENT PLACEMENT      Allergies: Codeine  Medications: Prior to Admission medications   Medication Sig Start Date End  Date Taking? Authorizing Provider  acetaminophen (TYLENOL) 325 MG tablet Take 325 mg by mouth every 6 (six) hours as needed.   Yes [provider]  albuterol (PROVENTIL) (2.5 MG/3ML) 0.083% nebulizer solution Take 2.5 mg by nebulization every 8 (eight) hours as needed for wheezing or shortness of breath.   Yes [provider]  albuterol (VENTOLIN HFA) 108 (90 Base) MCG/ACT inhaler Inhale 2 puffs into the lungs 4 (four) times daily as needed for wheezing. 03/25/18  Yes [provider]  allopurinol (ZYLOPRIM) 100 MG tablet Take 0.5 tablets (50 mg total) by mouth every other day. 12/22/20  Yes Wieting, Richard, MD  ALPRAZolam Duanne Moron) 0.25 MG tablet Take 1 tablet (0.25 mg total) by mouth 2 (two) times daily as needed for anxiety. 12/20/20  Yes Wieting, Richard, MD  apixaban (ELIQUIS) 5 MG TABS tablet Take 1 tablet (5 mg total) by mouth 2 (two) times daily. 12/20/20  Yes Wieting, Richard, MD  ascorbic acid (VITAMIN C) 500 MG tablet Take 1 tablet (500 mg total) by mouth 2 (two) times daily. 12/20/20  Yes Wieting, Richard, MD  atorvastatin (LIPITOR) 40 MG tablet Take 1 tablet (40 mg total) by mouth daily. 12/21/20  Yes Wieting, Richard, MD  dicyclomine (BENTYL) 10 MG capsule Take 1 capsule (10 mg total) by mouth 4 (four) times daily -  before meals and at bedtime. 12/21/20  Yes Wieting, Richard, MD  diltiazem (CARDIZEM CD) 180 MG 24 hr capsule Take 1 capsule (180 mg total) by mouth daily. 12/21/20  Yes Wieting, Richard, MD  doxycycline (VIBRA-TABS) 100 MG tablet Take 100 mg by mouth 2 (two) times daily. 12/27/20  Yes  [provider]  ferrous gluconate (FERGON) 324 MG tablet Take 1 tablet (324 mg total) by mouth daily with breakfast. 12/21/20  Yes Wieting, Richard, MD  hydrALAZINE (APRESOLINE) 100 MG tablet Take 1 tablet (100 mg total) by mouth every 8 (eight) hours. 12/20/20  Yes Wieting, Richard, MD  levofloxacin (LEVAQUIN) 500 MG tablet Take 500 mg by mouth daily.   Yes  [provider]  levothyroxine (SYNTHROID) 100 MCG tablet Take 100 mcg by mouth every morning. 10/04/20  Yes [provider]  meclizine (ANTIVERT) 12.5 MG tablet Take 12.5 mg by mouth 3 (three) times daily as needed for dizziness. 08/30/20  Yes [provider]  metoprolol tartrate (LOPRESSOR) 50 MG tablet Take 1 tablet (50 mg total) by mouth 2 (two) times daily. 12/20/20  Yes Loletha Grayer, MD  metroNIDAZOLE (FLAGYL) 500 MG tablet  12/21/20  Yes [provider]  Multiple Vitamin (MULTIVITAMIN WITH MINERALS) TABS tablet Take 1 tablet by mouth daily. 12/20/20  Yes Wieting, Richard, MD  Nutritional Supplements (,FEEDING SUPPLEMENT, PROSOURCE PLUS) liquid Take 30 mLs by mouth 3 (three) times daily between meals. 12/20/20  Yes Wieting, Richard, MD  ondansetron (ZOFRAN) 4 MG tablet Place 1 tablet (4 mg total) into feeding tube every 6 (six) hours as needed for nausea or vomiting. 12/20/20  Yes Wieting, Richard, MD  pantoprazole (PROTONIX) 40 MG tablet Take 1 tablet (40 mg total) by mouth 2 (two) times daily. 12/20/20  Yes Wieting, Richard, MD  polyethylene glycol (MIRALAX / GLYCOLAX) 17 g packet Take 17 g by mouth daily as needed for severe constipation. 12/21/20  Yes Wieting, Richard, MD  predniSONE (DELTASONE) 20 MG tablet Take 20 mg by mouth 2 (two) times daily. Take two tablets (40mg  total) by mouth daily for 5 days   Yes [provider]  torsemide (DEMADEX) 20 MG tablet Take 1 tablet (20 mg total) by mouth daily. 12/21/20  Yes Wieting, Richard, MD  tuberculin (TUBERSOL) 5 UNIT/0.1ML injection Inject 0.1 mLs into the skin once. Once every 14 days   Yes [provider]  venlafaxine (EFFEXOR) 50 MG tablet Take 1 tablet (50 mg total) by mouth 2 (two) times daily. 12/20/20  Yes Wieting, Richard, MD  liraglutide (VICTOZA) 18 MG/3ML SOPN Inject into the skin. Patient not taking: Reported on 01/10/2021 10/07/18 07/03/21  [provider]     Family  History  Problem Relation Age of Onset   Hypertension Mother     Social History   Socioeconomic History   Marital status: Single    Spouse name: Not on file   Number of children: Not on file   Years of education: Not on file   Highest education level: Not on file  Occupational History   Not on file  Tobacco Use   Smoking status: Former   Smokeless tobacco: Never  Substance and Sexual Activity   Alcohol use: Never   Drug use: Never   Sexual activity: Not on file  Other Topics Concern   Not on file  Social History Narrative   Not on file   Social Determinants of Health   Financial Resource Strain: Not on file  Food Insecurity: Not on file  Transportation Needs: Not on file  Physical Activity: Not on file  Stress: Not on file  Social Connections: Not on file    Review of Systems: A 12 point ROS discussed and pertinent positives are indicated in the HPI above.  All other systems are negative.  Review of Systems  Respiratory:  Negative for cough and shortness of breath.   Cardiovascular:  Negative for chest pain.  Gastrointestinal:  Positive for nausea. Negative for diarrhea and vomiting.  Psychiatric/Behavioral:  The patient is nervous/anxious.    Vital Signs: BP (!) 150/90 (BP Location: Right Arm)    Pulse (!) 125    Temp (!) 97.5 F (36.4 C) (Oral)    Resp 15    Ht 5\' 7"  (1.702 m)    Wt 192 lb 3.9 oz (87.2 kg)    SpO2 97%    BMI 30.11 kg/m   Physical Exam HENT:     Mouth/Throat:     Mouth: Mucous membranes are moist.     Pharynx: Oropharynx is clear.  Pulmonary:     Effort: Pulmonary effort is normal.  Neurological:     Mental Status: She is alert and oriented to person, place, and time.    Imaging: US RENAL  Result Date: 01/09/2021 CLINICAL DATA:  AKI EXAM: RENAL / URINARY TRACT ULTRASOUND COMPLETE COMPARISON:  11/09/2020 FINDINGS: Right Kidney: Renal measurements: 9.1 x 4.5 x 4.3 cm = volume: 93.1 mL. Increased renal echogenicity. No mass or  hydronephrosis visualized. Left Kidney: Renal measurements: 8.9 x 4.4 x 3.9 cm = volume: 79.1 mL. Increased renal echogenicity. No mass or hydronephrosis visualized. Bladder: Appears normal for degree of bladder distention. Other: Ascites in the right upper quadrant. Left-greater-than-right pleural effusions. IMPRESSION: 1. Increased bilateral renal parenchymal echogenicity, similar to the prior exam, consistent with chronic medical renal disease. Otherwise unremarkable kidneys. 2. Right upper quadrant ascites. 3. Left-greater-than-right pleural effusions. Electronically Signed   By: Merilyn Baba M.D.   On: 01/09/2021 19:00   DG CHEST PORT 1 VIEW  Result Date: 01/13/2021 CLINICAL DATA:  Dyspnea.  COVID positive. EXAM: PORTABLE CHEST 1 VIEW COMPARISON:  01/09/2021. FINDINGS: Interstitial hazy airspace lung opacities have improved from the prior study. Mild residual opacities are most noted on the right. No new lung abnormalities. No convincing pleural effusion.  No pneumothorax. Cardiac silhouette is normal in size. IMPRESSION: 1. Interval improvement in interstitial airspace lung opacities. This is consistent with improved multifocal pneumonia given the history of COVID-19 positive status. Electronically Signed   By: Lajean Manes M.D.   On: 01/13/2021 09:15   DG Chest Portable 1 View  Result Date: 01/09/2021 CLINICAL DATA:  Shortness of breath. EXAM: PORTABLE CHEST 1 VIEW COMPARISON:  November 21, 2020. FINDINGS: Stable cardiomegaly. Mild central pulmonary vascular congestion is again noted. Right upper and lower lobe opacities are noted concerning for edema or possibly pneumonia. Bony thorax is unremarkable. IMPRESSION: Stable cardiomegaly with mild central pulmonary vascular congestion. Right upper and lower lobe opacities are noted concerning for edema or possibly pneumonia. Electronically Signed   By: Marijo Conception M.D.   On: 01/09/2021 13:21   DG Abd 2 Views  Result Date: 12/20/2020 CLINICAL  DATA:  Abdominal pain and distension.  Sepsis. EXAM: ABDOMEN - 2 VIEW COMPARISON:  11/17/2020 FINDINGS: Normal bowel gas pattern.  No obstruction or ileus. Surgical clips in the abdomen and pelvis on the left. No urinary tract calcifications. Degenerative changes in the lumbar spine. IMPRESSION: Normal bowel gas pattern. Electronically Signed   By: Franchot Gallo M.D.   On: 12/20/2020 15:01    Labs:  CBC: Recent Labs    01/12/21 1646 01/13/21 0056 01/13/21 1131 01/14/21 0036  WBC 7.0 11.7* 8.2 6.7  HGB 7.7* 8.3*   8.3* 7.6* 6.7*  HCT 23.3* 25.4*   25.7* 23.2* 20.3*  PLT  127* 115* 114* 102*    COAGS: Recent Labs    11/09/20 1818 11/09/20 1825 11/22/20 0709 01/09/21 2242 01/10/21 0258 01/11/21 0635 01/11/21 1838 01/12/21 0610 01/12/21 1542  INR 1.3*  --  1.3* 3.4*  --   --   --   --   --   APTT  --    < > 33 >200*   < > 154* 126* 56* 39*   < > = values in this interval not displayed.    BMP: Recent Labs    01/11/21 0635 01/12/21 0610 01/13/21 0056 01/14/21 0036  NA 137 138 139 140  K 4.0 3.8 4.1 4.2  CL 107 108 104 105  CO2 14* 19* 19* 20*  GLUCOSE 131* 117* 121* 168*  BUN 95* 96* 102* 106*  CALCIUM 8.8* 8.4* 8.4* 8.5*  CREATININE 6.79* 6.98* 7.03* 7.11*  GFRNONAA 6* 6* 6* 6*    LIVER FUNCTION TESTS: Recent Labs    01/11/21 0635 01/12/21 0610 01/13/21 0056 01/14/21 0036  BILITOT 0.8 0.6 0.8 0.9  AST 15 16 19 22   ALT 9 9 10 13   ALKPHOS 58 52 53 53  PROT 5.4* 5.2* 4.9* 4.7*  ALBUMIN 3.0* 2.8* 2.7* 2.5*    TUMOR MARKERS: No results for input(s): AFPTM, CEA, CA199, CHROMGRNA in the last 8760 hours.  Assessment and Plan:  Covid pneumonia complicated by acute on chronic kidney disease; hemodialysis needed: [name], 71 year old female, presents today to the [facility] department for an image-guided tunneled dialysis catheter.   Risks and benefits discussed with the patient including, but not limited to bleeding,  infection, vascular injury, pneumothorax which may require chest tube placement, air embolism or even death  All of the patient's questions were answered, patient is agreeable to proceed.  Consent signed and in chart.   Thank you for this interesting consult.  I greatly enjoyed meeting MALOREE UPLINGER and look forward to participating in their care.  A copy of this report was sent to the requesting provider on this date.  Electronically Signed: Soyla Dryer, AGACNP-BC [phone] 01/14/2021, 8:06 AM   I spent a total of 20 Minutes    in face to face in clinical consultation, greater than 50% of which was counseling/coordinating care for tunneled dialysis catheter.

## 2021-01-14 NOTE — Progress Notes (Signed)
[facility] [place]  Subjective  - bloody stools again last night.   Objective (!) 150/90 (!) 125 (!) 97.5 F (36.4 C) (Oral) (P) 14 (P) 98%  Intake/Output Summary (Last 24 hours) at 01/14/2021 0828 Last data filed at 01/14/2021 4967 Gross per 24 hour  Intake 616.44 ml  Output 300 ml  Net 316.44 ml    Bilateral radial and brachial pulses palpable upper extremities Left arm swelling  Laboratory Lab Results: Recent Labs    01/13/21 1131 01/14/21 0036  WBC 8.2 6.7  HGB 7.6* 6.7*  HCT 23.2* 20.3*  PLT 114* 102*   BMET Recent Labs    01/13/21 0056 01/14/21 0036  NA 139 140  K 4.1 4.2  CL 104 105  CO2 19* 20*  GLUCOSE 121* 168*  BUN 102* 106*  CREATININE 7.03* 7.11*  CALCIUM 8.4* 8.5*    COAG Lab Results  Component Value Date   INR 3.4 (H) 01/09/2021   INR 1.3 (H) 11/22/2020   INR 1.3 (H) 11/09/2020   No results found for: PTT  Assessment/Planning:  72 year old female that vascular was consulted for permanent dialysis access.  Awaiting vein mapping today to make a decision about which arm for permanent access given some baseline left arm swelling.  She continues to have bloody stools as her dominant complaint.  We will follow.  Marty Heck 01/14/2021 8:28 AM --

## 2021-01-15 ENCOUNTER — Inpatient Hospital Stay (HOSPITAL_COMMUNITY): Payer: Medicare (Managed Care) | Admitting: Certified Registered Nurse Anesthetist

## 2021-01-15 ENCOUNTER — Encounter (HOSPITAL_COMMUNITY)
Admission: EM | Disposition: A | Discharge status: Skilled Nursing Facility | Payer: Self-pay | Source: Skilled Nursing Facility | Attending: Student in an Organized Health Care Education/Training Program

## 2021-01-15 ENCOUNTER — Encounter (HOSPITAL_COMMUNITY): Payer: Self-pay | Admitting: Internal Medicine

## 2021-01-15 DIAGNOSIS — N186 End stage renal disease: Secondary | ICD-10-CM

## 2021-01-15 DIAGNOSIS — U071 COVID-19: Secondary | ICD-10-CM | POA: Diagnosis not present

## 2021-01-15 DIAGNOSIS — K922 Gastrointestinal hemorrhage, unspecified: Secondary | ICD-10-CM | POA: Diagnosis not present

## 2021-01-15 HISTORY — PX: AV FISTULA PLACEMENT: SHX1204

## 2021-01-15 LAB — GLUCOSE, CAPILLARY
Glucose-Capillary: 140 mg/dL — ABNORMAL HIGH (ref 70–99)
Glucose-Capillary: 130 mg/dL — ABNORMAL HIGH (ref 70–99)
Glucose-Capillary: 129 mg/dL — ABNORMAL HIGH (ref 70–99)
Glucose-Capillary: 105 mg/dL — ABNORMAL HIGH (ref 70–99)
Glucose-Capillary: 116 mg/dL — ABNORMAL HIGH (ref 70–99)
Glucose-Capillary: 111 mg/dL — ABNORMAL HIGH (ref 70–99)

## 2021-01-15 LAB — BPAM RBC
Blood Product Expiration Date: 202301212359
ISSUE DATE / TIME: 202301030241
Unit Type and Rh: 5100

## 2021-01-15 LAB — TYPE AND SCREEN
ABO/RH(D): O POS
Antibody Screen: NEGATIVE
Unit division: 0

## 2021-01-15 LAB — CBC
HCT: 23.4 % — ABNORMAL LOW (ref 36.0–46.0)
Hemoglobin: 7.6 g/dL — ABNORMAL LOW (ref 12.0–15.0)
MCH: 29.3 pg (ref 26.0–34.0)
MCHC: 32.5 g/dL (ref 30.0–36.0)
MCV: 90.3 fL (ref 80.0–100.0)
Platelets: 80 10*3/uL — ABNORMAL LOW (ref 150–400)
RBC: 2.59 MIL/uL — ABNORMAL LOW (ref 3.87–5.11)
RDW: 20 % — ABNORMAL HIGH (ref 11.5–15.5)
WBC: 5.9 10*3/uL (ref 4.0–10.5)
nRBC: 0 % (ref 0.0–0.2)

## 2021-01-15 LAB — BASIC METABOLIC PANEL
Anion gap: 15 (ref 5–15)
BUN: 109 mg/dL — ABNORMAL HIGH (ref 8–23)
CO2: 19 mmol/L — ABNORMAL LOW (ref 22–32)
Calcium: 8.6 mg/dL — ABNORMAL LOW (ref 8.9–10.3)
Chloride: 106 mmol/L (ref 98–111)
Creatinine, Ser: 7.09 mg/dL — ABNORMAL HIGH (ref 0.44–1.00)
GFR, Estimated: 6 mL/min — ABNORMAL LOW (ref >60)
Glucose, Bld: 159 mg/dL — ABNORMAL HIGH (ref 70–99)
Potassium: 4.5 mmol/L (ref 3.5–5.1)
Sodium: 140 mmol/L (ref 135–145)

## 2021-01-15 LAB — PROTIME-INR
INR: 1.3 — ABNORMAL HIGH (ref 0.8–1.2)
Prothrombin Time: 16.6 seconds — ABNORMAL HIGH (ref 11.4–15.2)

## 2021-01-15 SURGERY — INSERTION OF ARTERIOVENOUS (AV) GORE-TEX GRAFT ARM
Anesthesia: Monitor Anesthesia Care | Site: Arm Upper | Laterality: Right

## 2021-01-15 MED ORDER — HEPARIN 6000 UNIT IRRIGATION SOLUTION
Status: AC
  Filled 2021-01-15: qty 500

## 2021-01-15 MED ORDER — ONDANSETRON HCL 4 MG/2ML IJ SOLN
INTRAMUSCULAR | Status: AC
  Filled 2021-01-15: qty 6

## 2021-01-15 MED ORDER — FENTANYL CITRATE (PF) 250 MCG/5ML IJ SOLN
INTRAMUSCULAR | Status: AC
  Filled 2021-01-15: qty 5

## 2021-01-15 MED ORDER — PHENYLEPHRINE HCL (PRESSORS) 10 MG/ML IV SOLN
INTRAVENOUS | Status: DC | PRN
  Administered 2021-01-15: 40 ug via INTRAVENOUS

## 2021-01-15 MED ORDER — ROCURONIUM BROMIDE 10 MG/ML (PF) SYRINGE
PREFILLED_SYRINGE | INTRAVENOUS | Status: AC
  Filled 2021-01-15: qty 20

## 2021-01-15 MED ORDER — FENTANYL CITRATE (PF) 250 MCG/5ML IJ SOLN
INTRAMUSCULAR | Status: DC | PRN
  Administered 2021-01-15 (×2): 25 ug via INTRAVENOUS
  Administered 2021-01-15: 50 ug via INTRAVENOUS

## 2021-01-15 MED ORDER — HEPARIN 6000 UNIT IRRIGATION SOLUTION
Status: DC | PRN
  Administered 2021-01-15: 1

## 2021-01-15 MED ORDER — SODIUM CHLORIDE 0.9 % IV SOLN: INTRAVENOUS | Status: DC

## 2021-01-15 MED ORDER — PHENYLEPHRINE 40 MCG/ML (10ML) SYRINGE FOR IV PUSH (FOR BLOOD PRESSURE SUPPORT)
PREFILLED_SYRINGE | INTRAVENOUS | Status: AC
  Filled 2021-01-15: qty 20

## 2021-01-15 MED ORDER — DILTIAZEM HCL-DEXTROSE 125-5 MG/125ML-% IV SOLN (PREMIX)
INTRAVENOUS | Status: DC | PRN
  Administered 2021-01-15: 5 mg/h via INTRAVENOUS

## 2021-01-15 MED ORDER — CHLORHEXIDINE GLUCONATE 0.12 % MT SOLN: 15.0000 mL | Freq: Once | OROMUCOSAL | Status: DC

## 2021-01-15 MED ORDER — CHLORHEXIDINE GLUCONATE 0.12 % MT SOLN
OROMUCOSAL | Status: AC
  Filled 2021-01-15: qty 15

## 2021-01-15 MED ORDER — LIDOCAINE HCL 1 % IJ SOLN
INTRAMUSCULAR | Status: DC | PRN
  Administered 2021-01-15: 9 mL

## 2021-01-15 MED ORDER — PROPOFOL 1000 MG/100ML IV EMUL
INTRAVENOUS | Status: AC
  Filled 2021-01-15: qty 100

## 2021-01-15 MED ORDER — DEXAMETHASONE SODIUM PHOSPHATE 10 MG/ML IJ SOLN
INTRAMUSCULAR | Status: AC
  Filled 2021-01-15: qty 3

## 2021-01-15 MED ORDER — HEPARIN SODIUM (PORCINE) 1000 UNIT/ML IJ SOLN
INTRAMUSCULAR | Status: DC | PRN
  Administered 2021-01-15: 3000 [IU] via INTRAVENOUS

## 2021-01-15 MED ORDER — DEXTROSE 5 % IV SOLN
INTRAVENOUS | Status: DC | PRN
  Administered 2021-01-15: 2 g via INTRAVENOUS

## 2021-01-15 MED ORDER — PROPOFOL 10 MG/ML IV BOLUS
INTRAVENOUS | Status: AC
  Filled 2021-01-15: qty 20

## 2021-01-15 MED ORDER — 0.9 % SODIUM CHLORIDE (POUR BTL) OPTIME
TOPICAL | Status: DC | PRN
  Administered 2021-01-15: 2000 mL

## 2021-01-15 MED ORDER — MIDAZOLAM HCL 2 MG/2ML IJ SOLN
INTRAMUSCULAR | Status: AC
  Filled 2021-01-15: qty 2

## 2021-01-15 MED ORDER — LIDOCAINE HCL (PF) 1 % IJ SOLN
INTRAMUSCULAR | Status: AC
  Filled 2021-01-15: qty 30

## 2021-01-15 MED ORDER — LIDOCAINE 2% (20 MG/ML) 5 ML SYRINGE
INTRAMUSCULAR | Status: AC
  Filled 2021-01-15: qty 20

## 2021-01-15 MED ORDER — ONDANSETRON HCL 4 MG/2ML IJ SOLN
INTRAMUSCULAR | Status: DC | PRN
Start: 2021-01-15 — End: 2021-01-15
  Administered 2021-01-15: 4 mg via INTRAVENOUS

## 2021-01-15 MED ORDER — ORAL CARE MOUTH RINSE: 15.0000 mL | Freq: Once | OROMUCOSAL | Status: DC

## 2021-01-15 MED ORDER — SODIUM CHLORIDE 0.9 % IV SOLN
INTRAVENOUS | Status: AC
  Filled 2021-01-15: qty 20

## 2021-01-15 MED ORDER — PROPOFOL 500 MG/50ML IV EMUL
INTRAVENOUS | Status: DC | PRN
  Administered 2021-01-15: 100 ug/kg/min via INTRAVENOUS

## 2021-01-15 MED ORDER — OXYCODONE-ACETAMINOPHEN 5-325 MG PO TABS: 1.0000 | ORAL_TABLET | Freq: Four times a day (QID) | ORAL | Status: DC | PRN

## 2021-01-15 MED ORDER — DILTIAZEM HCL-DEXTROSE 125-5 MG/125ML-% IV SOLN (PREMIX): INTRAVENOUS | Status: DC | PRN

## 2021-01-15 SURGICAL SUPPLY — 41 items
ARMBAND PINK RESTRICT EXTREMIT (MISCELLANEOUS) ×4 IMPLANT
BAG COUNTER SPONGE SURGICOUNT (BAG) ×2 IMPLANT
BLADE CLIPPER SURG (BLADE) ×2 IMPLANT
CANISTER SUCT 3000ML PPV (MISCELLANEOUS) ×2 IMPLANT
CLIP VESOCCLUDE MED 6/CT (CLIP) ×2 IMPLANT
CLIP VESOCCLUDE SM WIDE 6/CT (CLIP) ×2 IMPLANT
COVER PROBE W GEL 5X96 (DRAPES) ×2 IMPLANT
DECANTER SPIKE VIAL GLASS SM (MISCELLANEOUS) ×2 IMPLANT
DERMABOND ADVANCED (GAUZE/BANDAGES/DRESSINGS) ×1
DERMABOND ADVANCED .7 DNX12 (GAUZE/BANDAGES/DRESSINGS) ×1 IMPLANT
DRAPE WARM FLUID 44X44 (DRAPES) ×1 IMPLANT
ELECT REM PT RETURN 9FT ADLT (ELECTROSURGICAL) ×2
ELECTRODE REM PT RTRN 9FT ADLT (ELECTROSURGICAL) ×1 IMPLANT
GAUZE 4X4 16PLY ~~LOC~~+RFID DBL (SPONGE) ×1 IMPLANT
GLOVE SRG 8 PF TXTR STRL LF DI (GLOVE) ×1 IMPLANT
GLOVE SURG ENC MOIS LTX SZ7.5 (GLOVE) ×2 IMPLANT
GLOVE SURG MICRO LTX SZ6.5 (GLOVE) ×1 IMPLANT
GLOVE SURG UNDER POLY LF SZ8 (GLOVE) ×1
GOWN STRL REUS W/ TWL LRG LVL3 (GOWN DISPOSABLE) ×2 IMPLANT
GOWN STRL REUS W/ TWL XL LVL3 (GOWN DISPOSABLE) ×2 IMPLANT
GOWN STRL REUS W/TWL LRG LVL3 (GOWN DISPOSABLE) ×2
GOWN STRL REUS W/TWL XL LVL3 (GOWN DISPOSABLE) ×2
HEMOSTAT SPONGE AVITENE ULTRA (HEMOSTASIS) IMPLANT
KIT BASIN OR (CUSTOM PROCEDURE TRAY) ×2 IMPLANT
KIT TURNOVER KIT B (KITS) ×2 IMPLANT
LOOP VESSEL MINI RED (MISCELLANEOUS) ×1 IMPLANT
NS IRRIG 1000ML POUR BTL (IV SOLUTION) ×2 IMPLANT
PACK CV ACCESS (CUSTOM PROCEDURE TRAY) ×2 IMPLANT
PAD ARMBOARD 7.5X6 YLW CONV (MISCELLANEOUS) ×4 IMPLANT
PENCIL BUTTON HOLSTER BLD 10FT (ELECTRODE) ×1 IMPLANT
SPONGE T-LAP 18X18 ~~LOC~~+RFID (SPONGE) ×1 IMPLANT
SUT GORETEX 6.0 TT13 (SUTURE) IMPLANT
SUT MNCRL AB 4-0 PS2 18 (SUTURE) ×2 IMPLANT
SUT PROLENE 6 0 BV (SUTURE) ×2 IMPLANT
SUT PROLENE 7 0 BV 1 (SUTURE) IMPLANT
SUT SILK 2 0 PERMA HAND 18 BK (SUTURE) IMPLANT
SUT VIC AB 3-0 SH 27 (SUTURE) ×2
SUT VIC AB 3-0 SH 27X BRD (SUTURE) ×2 IMPLANT
TOWEL GREEN STERILE (TOWEL DISPOSABLE) ×2 IMPLANT
UNDERPAD 30X36 HEAVY ABSORB (UNDERPADS AND DIAPERS) ×2 IMPLANT
WATER STERILE IRR 1000ML POUR (IV SOLUTION) ×2 IMPLANT

## 2021-01-15 NOTE — Progress Notes (Signed)
Contacted by Dr Augustin Coupe to assist with out-pt HD arrangements. Unable to meet with pt this afternoon due to pt being off the floor for a  procedure. Will attempt to meet with pt tomorrow.   Melven Sartorius Renal Navigator [phone]

## 2021-01-15 NOTE — Consult Note (Addendum)
WOC Nurse Consult Note: Pt is in isolation for Covid and was wearing a negative pressure wound therapy device prior to admission at a SNF, according to progress notes.  Reason for Consult: Left anterior foot with full thickness post-op wound, 5X1.5X.8 cm, 50% red, 50% yellow slough, small amt tan drainage, no odor or fluctuance.  Pt was medicated for pain prior to the procedure and tolerated with minimal amt discomfort.  Applied barrier ring to wound edges to attempt to maintain a seal, then 1 piece black foam to 163mm cont suction.    Dressing procedure/placement/frequency:  Topical treatment orders provided for bedside nurses to perform as follows:  1. WOC will change left foot Vac Q M/W/F 2. Please remove Vac dressing to left foot when Pt is discharged and apply moist gauze dressing.  Facility can reapply Vac dressing after she is transferred.    [place] team will plan to change dressing on Fri if patient is still in the hospital at that time. Julien Girt MSN, RN, [place], [place], [place]

## 2021-01-15 NOTE — Anesthesia Postprocedure Evaluation (Signed)
Anesthesia Post Note  Patient: Autumn [name]  Procedure(s) Performed: INSERTION OF brachiobasilic  ARTERIOVENOUS (AV) GRAFT ARM (Right: Arm Upper)     Patient location during evaluation: PACU Anesthesia Type: MAC Level of consciousness: awake and alert Pain management: pain level controlled Vital Signs Assessment: post-procedure vital signs reviewed and stable Respiratory status: spontaneous breathing, nonlabored ventilation and respiratory function stable Cardiovascular status: stable and blood pressure returned to baseline Postop Assessment: no apparent nausea or vomiting Anesthetic complications: no   No notable events documented.  Last Vitals:  Vitals:   01/15/21 1630 01/15/21 1645  BP: 115/66 114/70  Pulse: 95 95  Resp: 12 12  Temp: (!) 36.4 C (!) 36.4 C  SpO2: 94% 94%    Last Pain:  Vitals:   01/15/21 1630  TempSrc:   PainSc: 0-No pain                 Natalio Salois,W. EDMOND

## 2021-01-15 NOTE — Transfer of Care (Signed)
Immediate Anesthesia Transfer of Care Note  Patient: Autumn [name]  Procedure(s) Performed: INSERTION OF brachiobasilic  ARTERIOVENOUS (AV) GRAFT ARM (Right: Arm Upper)  Patient Location: PACU  Anesthesia Type:MAC  Level of Consciousness: awake, alert  and oriented  Airway & Oxygen Therapy: Patient Spontanous Breathing  Post-op Assessment: Report given to RN and Post -op Vital signs reviewed and stable  Post vital signs: Reviewed and stable  Last Vitals:  Vitals Value Taken Time  BP 115/66 01/15/21 1631  Temp    Pulse 88 01/15/21 1633  Resp 12 01/15/21 1633  SpO2 96 % 01/15/21 1633  Vitals shown include unvalidated device data.  Last Pain:  Vitals:   01/15/21 1452  TempSrc:   PainSc: 0-No pain      Patients Stated Pain Goal: 0 (12/92/90 9030)  Complications: No notable events documented.

## 2021-01-15 NOTE — Progress Notes (Signed)
PT Cancellation Note  Patient Details Name: ANGELE WIEMANN MRN: [id number] DOB: 21-Mar-1950   Cancelled Treatment:    Reason Eval/Treat Not Completed: Patient at procedure or test/unavailable;Patient declined, no reason specified. Attempted PT session several times today, with pt being at HD treatment this morning. When re-attempted this afternoon, pt reported feeling too fatigued to attempt OOB mobility, sitting EOB, or supine exercises today. Pt being transferred to surgery at end of PT attempt. Will plan to follow-up another day as able.   Moishe Spice, PT, DPT Acute Rehabilitation Services  Pager: [phone] Office: [place] 01/15/2021, 1:55 PM

## 2021-01-15 NOTE — Progress Notes (Addendum)
°  Progress Note    01/15/2021 8:16 AM * No surgery found *  Subjective: No complaints   Vitals:   01/14/21 2034 01/15/21 0756  BP: 136/90 (!) 146/101  Pulse: (!) 107 99  Resp: 16 15  Temp: 97.6 F (36.4 C) 98.3 F (36.8 C)  SpO2: 93% 93%   Physical Exam: Lungs: Non labored Extremities: Edematous left arm; symmetrical radial pulses Neurologic: A&O  CBC    Component Value Date/Time   WBC 5.9 01/15/2021 0125   RBC 2.59 (L) 01/15/2021 0125   HGB 7.6 (L) 01/15/2021 0125   HCT 23.4 (L) 01/15/2021 0125   PLT 80 (L) 01/15/2021 0125   MCV 90.3 01/15/2021 0125   MCH 29.3 01/15/2021 0125   MCHC 32.5 01/15/2021 0125   RDW 20.0 (H) 01/15/2021 0125   LYMPHSABS 0.5 (L) 01/13/2021 0056   MONOABS 1.0 01/13/2021 0056   EOSABS 0.0 01/13/2021 0056   BASOSABS 0.0 01/13/2021 0056    BMET    Component Value Date/Time   NA 140 01/15/2021 0125   K 4.5 01/15/2021 0125   CL 106 01/15/2021 0125   CO2 19 (L) 01/15/2021 0125   GLUCOSE 159 (H) 01/15/2021 0125   BUN 109 (H) 01/15/2021 0125   CREATININE 7.09 (H) 01/15/2021 0125   CALCIUM 8.6 (L) 01/15/2021 0125   GFRNONAA 6 (L) 01/15/2021 0125   GFRAA 21 (L) 09/02/2018 1731    INR    Component Value Date/Time   INR 1.3 (H) 01/15/2021 0125     Intake/Output Summary (Last 24 hours) at 01/15/2021 0816 Last data filed at 01/14/2021 1807 Gross per 24 hour  Intake 206.57 ml  Output 300 ml  Net -93.43 ml     Assessment/Plan:  71 y.o. female who is being seen in consultation for evaluation of permanent dialysis access  Vein mapping demonstrates adequate right basilic and marginal right cephalic for conduit use.  Given left arm edema it is preferable to proceed with access placement in the right arm.  Despite being right-handed patient is agreeable to right arm access placement.  Plan will be for right arm AV fistula creation.  Please restrict right upper extremity.  Dr. Carlis Abbott will evaluate the patient later today and provide further  treatment plans including timing of the above procedure   Dagoberto Ligas, PA-C [facility] [phone] 01/15/2021 8:16 AM  I have seen and evaluated the patient. I agree with the PA note as documented above.  Vein mapping shows much better vein in the right arm.  She has left arm swelling at baseline so I would like to avoid the left arm as a fistula in that arm will make her swelling much worse.  She has not eaten this morning so I will add her onto the OR schedule this afternoon for right arm AV fistula.  Please keep NPO.  Consent order placed.  Okay with her dialyzing this morning.  Marty Heck, MD [facility] Office: [phone]

## 2021-01-15 NOTE — Anesthesia Preprocedure Evaluation (Addendum)
Anesthesia Evaluation  Patient identified by MRN, date of birth, ID band Patient awake    Reviewed: Allergy & Precautions, H&P , NPO status , Patient's Chart, lab work & pertinent test results, reviewed documented beta blocker date and time   Airway Mallampati: II  TM Distance: >3 FB Neck ROM: Full    Dental no notable dental hx. (+) Edentulous Upper, Edentulous Lower, Dental Advisory Given   Pulmonary neg pulmonary ROS, former smoker,    Pulmonary exam normal breath sounds clear to auscultation       Cardiovascular hypertension, Pt. on medications and Pt. on home beta blockers + dysrhythmias Atrial Fibrillation  Rhythm:Irregular Rate:Tachycardia     Neuro/Psych Depression CVA    GI/Hepatic Neg liver ROS, GERD  Medicated,  Endo/Other  diabetesHypothyroidism   Renal/GU ESRF and DialysisRenal disease  negative genitourinary   Musculoskeletal  (+) Arthritis , Osteoarthritis,    Abdominal   Peds  Hematology  (+) Blood dyscrasia, anemia ,   Anesthesia Other Findings   Reproductive/Obstetrics negative OB ROS                            Anesthesia Physical Anesthesia Plan  ASA: 3  Anesthesia Plan: MAC   Post-op Pain Management: Minimal or no pain anticipated   Induction: Intravenous  PONV Risk Score and Plan: 3 and Propofol infusion, Ondansetron and Treatment may vary due to age or medical condition  Airway Management Planned: Simple Face Mask and Natural Airway  Additional Equipment:   Intra-op Plan:   Post-operative Plan:   Informed Consent: I have reviewed the patients History and Physical, chart, labs and discussed the procedure including the risks, benefits and alternatives for the proposed anesthesia with the patient or authorized representative who has indicated his/her understanding and acceptance.     Dental advisory given  Plan Discussed with: CRNA  Anesthesia Plan  Comments:        Anesthesia Quick Evaluation

## 2021-01-15 NOTE — Progress Notes (Signed)
OT Cancellation Note  Patient Details Name: Autumn [name] MRN: [id number] DOB: Nov 09, 1950   Cancelled Treatment:    Reason Eval/Treat Not Completed: Patient at procedure or test/ unavailable- pt off unit at HD.  Will follow and see as able.   Jolaine Artist, OT Acute Rehabilitation Services Pager [phone] Office [phone]   Delight Stare 01/15/2021, 10:39 AM

## 2021-01-15 NOTE — Op Note (Signed)
OPERATIVE NOTE   PROCEDURE: right first stage basilic vein transposition (brachiobasilic arteriovenous fistula) placement  PRE-OPERATIVE DIAGNOSIS: ESRD  POST-OPERATIVE DIAGNOSIS: same  SURGEON: Marty Heck, MD  ASSISTANT(S): Leontine Locket, PA  ANESTHESIA:  MAC  ESTIMATED BLOOD LOSS: Minimal  FINDING(S): 1.  Basilic vein: 5.7-8 mm, acceptable 2.  Brachial artery: 4.5 mm, disease free 3.  Venous outflow: palpable thrill  4.  Radial flow: palpable radial pulse  SPECIMEN(S):  none  INDICATIONS:   Autumn [name] is a 71 y.o. female who presents with ESRD and need for permanent hemodialysis access.  The patient is scheduled for right first stage basilic vein transposition.  The patient is aware the risks include but are not limited to: bleeding, infection, steal syndrome, nerve damage, ischemic monomelic neuropathy, failure to mature, and need for additional procedures.  The patient is aware of the risks of the procedure and elects to proceed forward.   DESCRIPTION: After full informed written consent was obtained from the patient, the patient was brought back to the operating room and placed supine upon the operating table.  Prior to induction, the patient received IV antibiotics.   After obtaining adequate anesthesia, the patient was then prepped and draped in the standard fashion for a right arm access procedure.  I turned my attention first to identifying the patient's cephalic vein that was small in the upper arm.  I then identified the basilic vein that looked adequate.      Using SonoSite guidance, the location of the basilic vein and brachial artery were marked out on the skin at the level of the elbow.   At this point, I injected local anesthetic to obtain a field block of the antecubitum.  In total, I injected about 9 mL of 1% lidocaine without epinephrine.  I made a transverse incision at the level of the antecubitum and dissected through the subcutaneous  tissue and fascia to gain exposure of the brachial artery.  This was noted to be 4.5 mm in diameter externally.  This was dissected out proximally and distally and controlled with vessel loops .  I then dissected out the basilic vein.  This was noted to be 2.5 - 3 mm mm in diameter externally.  The distal segment of the vein was ligated with a  2-0 silk, and the vein was transected.  The proximal segment was interrogated with serial dilators.  The vein accepted up to a 5 mm dilator without any difficulty.  I then instilled the heparinized saline into the vein and clamped it.  At this point, I reset my exposure of the brachial artery.  The patient was given 3,000 units of IV heparin.  I then placed the artery under tension proximally and distally.  I made an arteriotomy with a #11 blade, and then I extended the arteriotomy with a Potts scissor.  I injected heparinized saline proximal and distal to this arteriotomy.  The vein was then sewn to the artery in an end-to-side configuration with a running stitch of 6-0 Prolene.  Prior to completing this anastomosis, I allowed the vein and artery to backbleed.  There was no evidence of clot from any vessels.  I completed the anastomosis in the usual fashion and then released all vessel loops and clamps.    There was a palpable thrill in the venous outflow, and there was a palpable radial pulse.  At this point, I irrigated out the surgical wound.  There was no further active bleeding.  The  subcutaneous tissue was reapproximated with a running stitch of 3-0 Vicryl.  The skin was then reapproximated with a running subcuticular stitch of 4-0 Monocryl.  The skin was then cleaned, dried, and reinforced with Dermabond.  The patient tolerated this procedure well.   COMPLICATIONS: None  CONDITION: Stable  Marty Heck MD [facility] Office: [place]   01/15/2021, 4:23 PM

## 2021-01-15 NOTE — Progress Notes (Signed)
[facility] Progress Note   71 y.o. female with a past medical history significant for DM2, HTN, hypothyroidism, NASH cirrhosis, pAfib, CVA, admitted for COVID pneumonia c/b AKI on top of advanced CKD4.     Assessment/ Plan:   1) AKI on CKD 4: Baseline CKD likely arterionephrosclerosis with diabetic kidney disease and possibly some contribution of HRS given cirrhosis although less likely.    UA does show some protein but not extensive.  CKD has been progressive also with recent AKI during hospitalization for osteomyelitis.  Baseline creatinine around 3.6-4 at this time.   Now with AKI likely secondary to ATN associated with COVID.  Creatinine continues to slowly worsen with levels >7.  Patient  hesitant to start dialysis but finally agreed to it stating you don't know till you try.   Appreciate VIR tunneled catheter for initiation of dialysis Seen on HD today 3K bath goal UF 1L net; appears comfortable and tolerating treatment.  Appreciate Dr. Carlis Abbott VVS seeing the patient in preparation for permanent access in rt arm today.    Next HD tomorrow + CLIP; renal navigator aware.  -Continue to monitor daily Cr, Dose meds for GFR -Monitor Daily I/Os, Daily weight  -Maintain MAP>65 for optimal renal perfusion.  -Avoid nephrotoxic medications including NSAIDs and Vanc/Zosyn combo   2) Acute hypoxic respiratory failure secondary to COVID-pneumonia: overall improved.  Continue management per primary team.     3) Anemia: Likely multifactorial with acute illness, chronic inflammation, CKD contributing.  Continue transfusion per primary team. Will check iron stores and consider ESA. May not respond to ESA with the inflammatory state - S/P  transfusion   4) Metabolic acidosis: Mixed anion gap and non-anion gap likely secondary to acute kidney injury.  continue sodium bicarb to 1300 mg 3 times daily.  S/p IV bicarb with good response.   5) UTI: Pyuria on urinalysis.  Minimal symptoms  were because of AKI we will treat.  Ceftriaxone 1 g x 5 days.     6) Atrial fibrillation: tachycardic. Management per primary  7) Left foot wound: Status post amputation, wound open.  Management per primary  8) Diabetes Mellitus Type 2 with Hyperglycemia: Management per primary      Subjective:   Denies  shortness of breath and cough but still very poor appetitie.  Denies further  blood from rectum. Denies f/c.   Objective:   BP (!) 149/116    Pulse (!) 115    Temp 98 F (36.7 C) (Temporal)    Resp 15    Ht 5\' 7"  (1.702 m)    Wt 88.5 kg    SpO2 94%    BMI 30.56 kg/m   Intake/Output Summary (Last 24 hours) at 01/15/2021 1034 Last data filed at 01/14/2021 1807 Gross per 24 hour  Intake 206.57 ml  Output 300 ml  Net -93.43 ml   Weight change:   Physical Exam: Constitutional: Chronically ill-appearing, lying in bed, no distress ENMT: ears and nose without scars or lesions, dry mucous membranes CV: tachy  Respiratory: Bilateral chest rise with no increased work of breathing Gastrointestinal: soft, non-tender, no palpable masses or hernias Skin: no visible lesions or rashes Psych: alert, judgement/insight appropriate, appropriate mood and affect Ext: 1+ edema b/l Access: RIJ TC  Imaging: IR Fluoro Guide CV Line Right  Result Date: 01/14/2021 INDICATION: Long-term dialysis access EXAM: 1. Ultrasound-guided puncture of the right internal jugular vein 2. Placement of a tunneled hemodialysis catheter using fluoroscopic guidance MEDICATIONS: None ANESTHESIA/SEDATION: Moderate (  conscious) sedation was employed during this procedure. A total of Versed 0.5 mg and Fentanyl 25 mcg was administered intravenously. Moderate Sedation Time: 22 minutes. [name] level of consciousness and vital signs were monitored continuously by radiology nursing throughout the procedure under my direct supervision. FLUOROSCOPY TIME:  Fluoroscopy Time: 2 minutes 8 seconds with 2 exposures COMPLICATIONS: None  immediate. PROCEDURE: Informed written consent was obtained from the patient after a thorough discussion of the procedural risks, benefits and alternatives. All questions were addressed. Maximal Sterile Barrier Technique was utilized including caps, mask, sterile gowns, sterile gloves, sterile drape, hand hygiene and skin antiseptic. A timeout was performed prior to the initiation of the procedure. The patient was placed supine on the exam table. The right neck and chest was prepped and draped in the standard sterile fashion. Ultrasound was used to evaluate the right internal jugular vein, which found to be patent and suitable for access. An ultrasound image was permanently stored in the electronic medical record. Using ultrasound guidance, the right internal jugular vein was directly punctured using a 21 gauge micropuncture set. An 018 wire was advanced into the SVC, followed by serial tract dilation and placement of an 035 wire which was advanced into the IVC. Attention was then turned to an infraclavicular location, where a small dermatotomy was made after the administration of additional local anesthetic. From this location, a 19 cm tip-to-cuff hemodialysis catheter was tunneled to the venotomy site. A peel-away sheath was then advanced over the access wire. Through this peel-away sheath, the hemodialysis catheter was advanced into the central veins, such that the tip was positioned near the superior cavoatrial junction. The line was found to flush and aspirate appropriately. It was sutured to the skin using 0 Prolene suture, and the venotomy was closed with Dermabond. A sterile dressing was placed. The patient tolerated the procedure well without immediate complication. IMPRESSION: Successful placement of a tunneled hemodialysis catheter via the right internal jugular vein. The line is ready for immediate use. Electronically Signed   By: Albin Felling M.D.   On: 01/14/2021 10:21   IR US Guide Vasc Access  Right  Result Date: 01/14/2021 INDICATION: Long-term dialysis access EXAM: 1. Ultrasound-guided puncture of the right internal jugular vein 2. Placement of a tunneled hemodialysis catheter using fluoroscopic guidance MEDICATIONS: None ANESTHESIA/SEDATION: Moderate (conscious) sedation was employed during this procedure. A total of Versed 0.5 mg and Fentanyl 25 mcg was administered intravenously. Moderate Sedation Time: 22 minutes. [name] level of consciousness and vital signs were monitored continuously by radiology nursing throughout the procedure under my direct supervision. FLUOROSCOPY TIME:  Fluoroscopy Time: 2 minutes 8 seconds with 2 exposures COMPLICATIONS: None immediate. PROCEDURE: Informed written consent was obtained from the patient after a thorough discussion of the procedural risks, benefits and alternatives. All questions were addressed. Maximal Sterile Barrier Technique was utilized including caps, mask, sterile gowns, sterile gloves, sterile drape, hand hygiene and skin antiseptic. A timeout was performed prior to the initiation of the procedure. The patient was placed supine on the exam table. The right neck and chest was prepped and draped in the standard sterile fashion. Ultrasound was used to evaluate the right internal jugular vein, which found to be patent and suitable for access. An ultrasound image was permanently stored in the electronic medical record. Using ultrasound guidance, the right internal jugular vein was directly punctured using a 21 gauge micropuncture set. An 018 wire was advanced into the SVC, followed by serial tract dilation and placement of  an 035 wire which was advanced into the IVC. Attention was then turned to an infraclavicular location, where a small dermatotomy was made after the administration of additional local anesthetic. From this location, a 19 cm tip-to-cuff hemodialysis catheter was tunneled to the venotomy site. A peel-away sheath was then advanced  over the access wire. Through this peel-away sheath, the hemodialysis catheter was advanced into the central veins, such that the tip was positioned near the superior cavoatrial junction. The line was found to flush and aspirate appropriately. It was sutured to the skin using 0 Prolene suture, and the venotomy was closed with Dermabond. A sterile dressing was placed. The patient tolerated the procedure well without immediate complication. IMPRESSION: Successful placement of a tunneled hemodialysis catheter via the right internal jugular vein. The line is ready for immediate use. Electronically Signed   By: Albin Felling M.D.   On: 01/14/2021 10:21   VAS [place] UPPER EXT VEIN MAPPING (PRE-OP AVF)  Result Date: 01/14/2021 UPPER EXTREMITY VEIN MAPPING Patient Name:  Autumn [name]  Date of Exam:   01/14/2021 Medical Rec #: [id number]           Accession #:    [id number] Date of Birth: July 10, 1950           Patient Gender: F Patient Age:   77 years Exam Location:  [facility] Procedure:      VAS [place] UPPER EXT VEIN MAPPING (PRE-OP AVF) Referring Phys: Otelia Santee --------------------------------------------------------------------------------  Indications: Pre-access. Comparison Study: no prior Performing Technologist: Archie Patten RVS  Examination Guidelines: A complete evaluation includes B-mode imaging, spectral Doppler, color Doppler, and power Doppler as needed of all accessible portions of each vessel. Bilateral testing is considered an integral part of a complete examination. Limited examinations for reoccurring indications may be performed as noted. +-----------------+-------------+----------+---------+  Right Cephalic    Diameter (cm) Depth (cm) Findings   +-----------------+-------------+----------+---------+  Shoulder              0.32         1.13               +-----------------+-------------+----------+---------+  Prox upper arm        0.23         1.08                +-----------------+-------------+----------+---------+  Mid upper arm         0.24         0.65               +-----------------+-------------+----------+---------+  Dist upper arm        0.26         0.89               +-----------------+-------------+----------+---------+  Antecubital fossa     0.25         0.52    branching  +-----------------+-------------+----------+---------+  Prox forearm          0.19         0.47               +-----------------+-------------+----------+---------+  Mid forearm           0.19         0.81               +-----------------+-------------+----------+---------+  Dist forearm          0.19         0.49  branching  +-----------------+-------------+----------+---------+  Wrist                 0.09         0.63               +-----------------+-------------+----------+---------+ +-----------------+-------------+----------+---------+  Right Basilic     Diameter (cm) Depth (cm) Findings   +-----------------+-------------+----------+---------+  Prox upper arm        0.40         1.01               +-----------------+-------------+----------+---------+  Mid upper arm         0.49         0.97               +-----------------+-------------+----------+---------+  Dist upper arm        0.38         0.88               +-----------------+-------------+----------+---------+  Antecubital fossa     0.30         0.72               +-----------------+-------------+----------+---------+  Prox forearm          0.21         0.64    branching  +-----------------+-------------+----------+---------+  Mid forearm           0.16         0.50               +-----------------+-------------+----------+---------+  Distal forearm        0.17         0.53               +-----------------+-------------+----------+---------+ +-----------------+-------------+----------+--------------+  Left Cephalic     Diameter (cm) Depth (cm)    Findings     +-----------------+-------------+----------+--------------+  Shoulder               0.17         1.34                    +-----------------+-------------+----------+--------------+  Prox upper arm        0.17         0.90                    +-----------------+-------------+----------+--------------+  Mid upper arm         0.15         0.68                    +-----------------+-------------+----------+--------------+  Dist upper arm        0.20         0.60                    +-----------------+-------------+----------+--------------+  Antecubital fossa     0.22         0.62      branching     +-----------------+-------------+----------+--------------+  Prox forearm                               not visualized  +-----------------+-------------+----------+--------------+  Mid forearm                                not visualized  +-----------------+-------------+----------+--------------+  Dist  forearm                               not visualized  +-----------------+-------------+----------+--------------+  Wrist                                      not visualized  +-----------------+-------------+----------+--------------+ +-----------------+-------------+----------+--------------+  Left Basilic      Diameter (cm) Depth (cm)    Findings     +-----------------+-------------+----------+--------------+  Prox upper arm        0.35         1.40                    +-----------------+-------------+----------+--------------+  Mid upper arm         0.32         1.19                    +-----------------+-------------+----------+--------------+  Dist upper arm        0.32         1.37                    +-----------------+-------------+----------+--------------+  Antecubital fossa     0.23         1.31      branching     +-----------------+-------------+----------+--------------+  Prox forearm          0.10         0.44      branching     +-----------------+-------------+----------+--------------+  Mid forearm                                not visualized   +-----------------+-------------+----------+--------------+  Distal forearm                             not visualized  +-----------------+-------------+----------+--------------+ *See table(s) above for measurements and observations.  Diagnosing physician: Deitra Mayo MD Electronically signed by Deitra Mayo MD on 01/14/2021 at 5:59:30 PM.    Final     Labs: BMET Recent Labs  Lab 01/09/21 1242 01/09/21 1336 01/10/21 7939 01/11/21 0300 01/12/21 0610 01/13/21 0056 01/14/21 0036 01/15/21 0125  NA 136 136 139 137 138 139 140 140  K 3.9 3.8 4.0 4.0 3.8 4.1 4.2 4.5  CL 106  --  108 107 108 104 105 106  CO2 14*  --  14* 14* 19* 19* 20* 19*  GLUCOSE 129*  --  112* 131* 117* 121* 168* 159*  BUN 83*  --  85* 95* 96* 102* 106* 109*  CREATININE 6.31*  --  6.65* 6.79* 6.98* 7.03* 7.11* 7.09*  CALCIUM 8.4*  --  8.8* 8.8* 8.4* 8.4* 8.5* 8.6*  PHOS  --   --  6.0* 5.5* 4.9* 5.3* 5.4*  --    CBC Recent Labs  Lab 01/10/21 0629 01/10/21 1622 01/11/21 0635 01/12/21 0610 01/12/21 1646 01/13/21 0056 01/13/21 1131 01/14/21 0036 01/14/21 1020 01/15/21 0125  WBC 2.7*  --  6.8 4.4   < > 11.7* 8.2 6.7 10.1 5.9  NEUTROABS 2.0  --  5.4 3.5  --  10.2*  --   --   --   --   HGB 6.7*   < > 8.1* 7.4*   < >  8.3*   8.3* 7.6* 6.7* 8.8* 7.6*  HCT 21.3*   < > 25.5* 23.6*   < > 25.4*   25.7* 23.2* 20.3* 26.3* 23.4*  MCV 94.2  --  89.8 89.7   < > 86.7 86.9 86.0 87.7 90.3  PLT 132*  --  194 PLATELET CLUMPS NOTED ON SMEAR, UNABLE TO ESTIMATE   < > 115* 114* 102* 122* 80*   < > = values in this interval not displayed.    Medications:     sodium chloride   Intravenous Once   atorvastatin  40 mg Oral Daily   Chlorhexidine Gluconate Cloth  6 each Topical Daily   Chlorhexidine Gluconate Cloth  6 each Topical Q0600   diltiazem  180 mg Oral Daily   feeding supplement  237 mL Oral TID BM   hydrocortisone  25 mg Rectal BID   insulin aspart  0-15 Units Subcutaneous Q4H   levothyroxine  100 mcg Oral  Q0600   metoprolol tartrate  50 mg Oral BID   multivitamin with minerals  1 tablet Oral Daily   pantoprazole  40 mg Oral BID   sodium bicarbonate  1,300 mg Oral TID      Otelia Santee, MD 01/15/2021, 10:34 AM

## 2021-01-15 NOTE — Progress Notes (Addendum)
HD#6 SUBJECTIVE:  Patient Summary: Autumn [name] is a 71 y.o. 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   Overnight Events: No acute events overnight  Interim History: This is hospital day 6 for Autumn [name] who was seen and evaluated at the bedside this morning. She has not had any bowel movements in the last day. No acute complaints this morning.   OBJECTIVE:  Vital Signs: Vitals:   01/14/21 1731 01/14/21 1733 01/14/21 2034 01/15/21 0531  BP: (!) 133/97  136/90   Pulse: (!) 122 91 (!) 107   Resp: 13 15 16    Temp: 98 F (36.7 C)  97.6 F (36.4 C)   TempSrc: Oral  Oral   SpO2: 98% 98% 93%   Weight:    88.1 kg  Height:       Supplemental O2: Nasal Cannula SpO2: 93 % O2 Flow Rate (L/min): 2 L/min FiO2 (%): 40 %  Filed Weights   01/12/21 0459 01/13/21 0400 01/15/21 0531  Weight: 87.5 kg 87.2 kg 88.1 kg     Intake/Output Summary (Last 24 hours) at 01/15/2021 0555 Last data filed at 01/14/2021 1807 Gross per 24 hour  Intake 602.57 ml  Output 300 ml  Net 302.57 ml   Net IO Since Admission: 1,108.64 mL [01/15/21 0555]  Physical Exam: General: Chronically ill appearing female laying in bed. No acute distress. Neck: R IJ tunneled cath in place CV: Tachycardic rate, irregular rhythm, no murmurs. 2+ bilateral pitting edema.  Pulmonary: Diminished breath sounds in b/l lung bases, normal work of breathing on 2 L [place] Abdominal: Soft, nontender, nondistended.  Extremities: Palpable radial pulses. 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  GU: Foley in place Skin: Warm and dry. Ecchymosis over arms and chest Neuro: A&Ox3.  No focal deficit. Psych: Normal mood and affect    ASSESSMENT/PLAN:   Principal Problem:    COVID-19 Active Problems:   Atrial fibrillation with RVR (HCC)   Acute kidney injury superimposed on CKD (HCC)   Liver cirrhosis secondary to NASH (nonalcoholic steatohepatitis) (HCC)   Anemia of chronic disease   Type II diabetes mellitus (HCC)   GI bleeding   #AKI on CKD IV #[place] bleeding CKD complicated by diabetic kidney disease and possibly hepatorenal syndrome, given history of cirrhosis. BUN/Cr remains markedly elevated at 109/7.09 and GFR 6. Patient remains oliguric with 300cc UOP in the last 24 hrs. R IJ HD catheter placed on 1/3 with IR. Patient was noted to have blood oozing from HD cath site yesterday, likely in the setting of uremic bleeding and she received 1 dose of DDAVP yesterday- no further active bleeding noted today. Plan for dialysis today and fistula placement with vascular surgery in right arm.  - Nephro consulted, appreciate recs - HD today and tomorrow  - Appreciate vascular surgery's assistance with permanent access placement - Strict I&Os and daily weights - Avoid nephrotoxic agents  #COVID pneumonia Overall remains clinically stable from a respiratory standpoint. Received 6 days of decadron 6 mg and SpO2 remains >90%.   #Acute on chronic anemia #GI bleed suspected #NASH Cirrhosis  Patient is s/p 3u of PRBC this admission. GI bleed suspected in the setting of ongoing melena, however, this appears to be improved today with no melanotic stools noted in the last  24 hrs. GI was consulted yesterday and recommended medical treatment of the internal hemorrhoids only at this time.  - Hydrocortisone suppositories - Flexible sigmoidoscopy if no improvement - Continue protonix 40 mg bid - Continue to hold heparin, will resume anticoagulation if bleeding does not continue - Will discontinue ceftriaxone ppx at this time  #Thrombocytopenia Platelets dropped from 122 to 80 today. Thrombocytopenia likely due to cirrhosis. - Monitor CBC  #UTI  Patient completed 5 day  course of ceftriaxone.   Best Practice: Diet: NPO IVF: Fluids: none VTE: Place and maintain sequential compression device Start: 01/12/21 1731 Restart heparin in 1-2 days if no further GI bleeding Code: Full AB: Ceftriaxone Therapy Recs: SNF Family contact: Juanda Bond, to be updated  DISPO: Anticipated discharge to Skilled nursing facility pending Medical stability.  Signature: Buddy Duty, D.O.  Internal Medicine Resident, PGY-1 [facility]  Pager: [phone] 5:55 AM, 01/15/2021   Please contact the on call pager after 5 pm and on weekends at [phone].

## 2021-01-15 NOTE — Discharge Instructions (Addendum)
[facility]  Discharge Instructions  AV Fistula or Graft Surgery for Dialysis Access  Please refer to the following instructions for your post-procedure care. Your surgeon or physician assistant will discuss any changes with you.  Activity  You may drive the day following your surgery, if you are comfortable and no longer taking prescription pain medication. Resume full activity as the soreness in your incision resolves.  Bathing/Showering  You may shower after you go home. Keep your incision dry for 48 hours. Do not soak in a bathtub, hot tub, or swim until the incision heals completely. You may not shower if you have a hemodialysis catheter.  Incision Care  Clean your incision with mild soap and water after 48 hours. Pat the area dry with a clean towel. You do not need a bandage unless otherwise instructed. Do not apply any ointments or creams to your incision. You may have skin glue on your incision. Do not peel it off. It will come off on its own in about one week. Your arm may swell a bit after surgery. To reduce swelling use pillows to elevate your arm so it is above your heart. Your doctor will tell you if you need to lightly wrap your arm with an ACE bandage.  Diet  Resume your normal diet. There are not special food restrictions following this procedure. In order to heal from your surgery, it is CRITICAL to get adequate nutrition. Your body requires vitamins, minerals, and protein. Vegetables are the best source of vitamins and minerals. Vegetables also provide the perfect balance of protein. Processed food has little nutritional value, so try to avoid this.  Medications  Resume taking all of your medications. If your incision is causing pain, you may take over-the counter pain relievers such as acetaminophen (Tylenol). If you were prescribed a stronger pain medication, please be aware these medications can cause nausea and constipation. Prevent  nausea by taking the medication with a snack or meal. Avoid constipation by drinking plenty of fluids and eating foods with high amount of fiber, such as fruits, vegetables, and grains.  Do not take Tylenol if you are taking prescription pain medications.  Follow up Your surgeon may want to see you in the office following your access surgery. If so, this will be arranged at the time of your surgery.  Please call us immediately for any of the following conditions:  Increased pain, redness, drainage (pus) from your incision site Fever of 101 degrees or higher Severe or worsening pain at your incision site Hand pain or numbness.  Reduce your risk of vascular disease:  Stop smoking. If you would like help, call [facility] at 1-800-QUIT-NOW 813 001 4325) or [place] at [place] your cholesterol Maintain a desired weight Control your diabetes Keep your blood pressure down  Dialysis  It will take several weeks to several months for your new dialysis access to be ready for use. Your surgeon will determine when it is okay to use it. Your nephrologist will continue to direct your dialysis. You can continue to use your Permcath until your new access is ready for use.   01/15/2021 Autumn [name] [id number] December 11, 1950  Surgeon(s): Marty Heck, MD  Procedure(s): Creation of right 1st stage basilic vein transposition  x Do not stick fistula for 12 weeks    If you have any questions, please call the office at [phone].    Dear Autumn [name],  You were hospitalized for worsening of your  kidney function, that was likely caused by your COVID infection. You were hospitalized for a while and were started on dialysis, which you will continue to do on Monday, Wednesday, and Fridays. Your rehab facility will work on your transportation.   For your Afib, continue to take your cardizem and metoprolol to keep your heart rate under control.   Please be sure to let  your doctors if you continue to have any bowel movements with bright red or dark blood. Please also let your doctor know if you have any drainage coming from the wound in your foot. I am glad you are feeling better and hope that you do well with rehab!

## 2021-01-16 ENCOUNTER — Encounter (HOSPITAL_COMMUNITY): Payer: Self-pay | Admitting: Vascular Surgery

## 2021-01-16 DIAGNOSIS — Z9889 Other specified postprocedural states: Secondary | ICD-10-CM

## 2021-01-16 DIAGNOSIS — U071 COVID-19: Secondary | ICD-10-CM | POA: Diagnosis not present

## 2021-01-16 LAB — CBC
HCT: 23.1 % — ABNORMAL LOW (ref 36.0–46.0)
Hemoglobin: 7.6 g/dL — ABNORMAL LOW (ref 12.0–15.0)
MCH: 29.6 pg (ref 26.0–34.0)
MCHC: 32.9 g/dL (ref 30.0–36.0)
MCV: 89.9 fL (ref 80.0–100.0)
Platelets: 74 10*3/uL — ABNORMAL LOW (ref 150–400)
RBC: 2.57 MIL/uL — ABNORMAL LOW (ref 3.87–5.11)
RDW: 20.2 % — ABNORMAL HIGH (ref 11.5–15.5)
WBC: 8.2 10*3/uL (ref 4.0–10.5)
nRBC: 0 % (ref 0.0–0.2)

## 2021-01-16 LAB — GLUCOSE, CAPILLARY
Glucose-Capillary: 101 mg/dL — ABNORMAL HIGH (ref 70–99)
Glucose-Capillary: 110 mg/dL — ABNORMAL HIGH (ref 70–99)
Glucose-Capillary: 118 mg/dL — ABNORMAL HIGH (ref 70–99)
Glucose-Capillary: 176 mg/dL — ABNORMAL HIGH (ref 70–99)
Glucose-Capillary: 87 mg/dL (ref 70–99)
Glucose-Capillary: 93 mg/dL (ref 70–99)

## 2021-01-16 LAB — RENAL FUNCTION PANEL
Albumin: 2.5 g/dL — ABNORMAL LOW (ref 3.5–5.0)
Anion gap: 12 (ref 5–15)
BUN: 65 mg/dL — ABNORMAL HIGH (ref 8–23)
CO2: 22 mmol/L (ref 22–32)
Calcium: 8.2 mg/dL — ABNORMAL LOW (ref 8.9–10.3)
Chloride: 104 mmol/L (ref 98–111)
Creatinine, Ser: 4.82 mg/dL — ABNORMAL HIGH (ref 0.44–1.00)
GFR, Estimated: 9 mL/min — ABNORMAL LOW (ref >60)
Glucose, Bld: 107 mg/dL — ABNORMAL HIGH (ref 70–99)
Phosphorus: 5 mg/dL — ABNORMAL HIGH (ref 2.5–4.6)
Potassium: 4.1 mmol/L (ref 3.5–5.1)
Sodium: 138 mmol/L (ref 135–145)

## 2021-01-16 MED ORDER — INSULIN ASPART 100 UNIT/ML IJ SOLN
0.0000 [IU] | Freq: Three times a day (TID) | INTRAMUSCULAR | Status: DC
  Administered 2021-01-17 – 2021-01-18 (×3): 2 [IU] via SUBCUTANEOUS
  Administered 2021-01-19: 3 [IU] via SUBCUTANEOUS
  Administered 2021-01-19: 2 [IU] via SUBCUTANEOUS
  Administered 2021-01-20: 3 [IU] via SUBCUTANEOUS
  Administered 2021-01-22 – 2021-01-23 (×3): 2 [IU] via SUBCUTANEOUS
  Administered 2021-01-24: 3 [IU] via SUBCUTANEOUS
  Administered 2021-01-24: 2 [IU] via SUBCUTANEOUS

## 2021-01-16 MED ORDER — HEPARIN SODIUM (PORCINE) 1000 UNIT/ML IJ SOLN
INTRAMUSCULAR | Status: AC
  Filled 2021-01-16: qty 4

## 2021-01-16 MED ORDER — SODIUM BICARBONATE 650 MG PO TABS
650.0000 mg | ORAL_TABLET | Freq: Two times a day (BID) | ORAL | Status: DC
  Administered 2021-01-16 – 2021-01-17 (×3): 650 mg via ORAL
  Filled 2021-01-16 (×3): qty 1

## 2021-01-16 MED ORDER — HEPARIN SODIUM (PORCINE) 5000 UNIT/ML IJ SOLN
5000.0000 [IU] | Freq: Three times a day (TID) | INTRAMUSCULAR | Status: DC
  Administered 2021-01-16 – 2021-01-19 (×2): 5000 [IU] via SUBCUTANEOUS
  Filled 2021-01-16 (×4): qty 1

## 2021-01-16 MED ORDER — INSULIN ASPART 100 UNIT/ML IJ SOLN: 0.0000 [IU] | Freq: Every day | INTRAMUSCULAR | Status: DC

## 2021-01-16 MED ORDER — HEPARIN SODIUM (PORCINE) 1000 UNIT/ML IJ SOLN
INTRAMUSCULAR | Status: AC
  Administered 2021-01-16: 1000 [IU]
  Filled 2021-01-16: qty 4

## 2021-01-16 NOTE — Progress Notes (Addendum)
POST OPERATIVE OFFICE NOTE    CC:  F/u for surgery  HPI:  This is a 71 y.o. female who is s/p right first stage basilic fistula creation on 01/15/21 by Dr. Carlis Abbott.    Pt returns today for follow up.  Pt states doing OK  Allergies  Allergen Reactions   Codeine Nausea And Vomiting    Current Facility-Administered Medications  Medication Dose Route Frequency Provider Last Rate Last Admin   0.9 %  sodium chloride infusion (Manually program via Guardrails IV Fluids)   Intravenous Once Rhyne, Samantha J, PA-C       0.9 %  sodium chloride infusion   Intravenous Continuous Rhyne, Hulen Shouts, PA-C 10 mL/hr at 01/15/21 1436 Restarted at 01/15/21 1504   acetaminophen (TYLENOL) tablet 650 mg  650 mg Oral Q6H PRN Gabriel Earing, PA-C   650 mg at 01/13/21 0854   albuterol (PROVENTIL) (2.5 MG/3ML) 0.083% nebulizer solution 3 mL  3 mL Inhalation Q4H PRN Rhyne, Samantha J, PA-C       atorvastatin (LIPITOR) tablet 40 mg  40 mg Oral Daily Rhyne, Samantha J, PA-C   40 mg at 01/15/21 1745   chlorhexidine (PERIDEX) 0.12 % solution 15 mL  15 mL Mouth/Throat Once Rhyne, Samantha J, PA-C       Or   MEDLINE mouth rinse  15 mL Mouth Rinse Once Rhyne, Samantha J, PA-C       Chlorhexidine Gluconate Cloth 2 % PADS 6 each  6 each Topical Daily Rhyne, Hulen Shouts, PA-C   6 each at 01/15/21 1745   diltiazem (CARDIZEM CD) 24 hr capsule 180 mg  180 mg Oral Daily Rhyne, Samantha J, PA-C   180 mg at 01/15/21 1748   feeding supplement (ENSURE ENLIVE / ENSURE PLUS) liquid 237 mL  237 mL Oral TID BM Rhyne, Samantha J, PA-C   237 mL at 01/15/21 2158   hydrocortisone (ANUSOL-HC) suppository 25 mg  25 mg Rectal BID Rhyne, Samantha J, PA-C   25 mg at 01/15/21 0108   insulin aspart (novoLOG) injection 0-15 Units  0-15 Units Subcutaneous Q4H Rhyne, Hulen Shouts, PA-C   2 Units at 01/15/21 0526   levothyroxine (SYNTHROID) tablet 100 mcg  100 mcg Oral Q0600 Gabriel Earing, PA-C   100 mcg at 01/16/21 0449   metoprolol tartrate  (LOPRESSOR) tablet 50 mg  50 mg Oral BID Rhyne, Samantha J, PA-C   50 mg at 01/15/21 2157   multivitamin with minerals tablet 1 tablet  1 tablet Oral Daily Gabriel Earing, PA-C   1 tablet at 01/15/21 1748   oxyCODONE-acetaminophen (PERCOCET/ROXICET) 5-325 MG per tablet 1 tablet  1 tablet Oral Q6H PRN Rhyne, Samantha J, PA-C       pantoprazole (PROTONIX) EC tablet 40 mg  40 mg Oral BID Rhyne, Samantha J, PA-C   40 mg at 01/15/21 2156   senna-docusate (Senokot-S) tablet 1 tablet  1 tablet Oral QHS PRN Rhyne, Samantha J, PA-C       sodium bicarbonate tablet 1,300 mg  1,300 mg Oral TID Rhyne, Samantha J, PA-C   1,300 mg at 01/15/21 2157     ROS:  See HPI  Physical Exam:    Incision:  Incision healing well Extremities:  Palpable radial pulse and thrill in fistula.  Left hand motor and sensation intact    Assessment/Plan:  This is a 71 y.o. female who is s/p: right UE first stage basilic av fistula   F/U with VVS in 4-5 weeks with  duplex to plan second stage basilic  Call vascular with any concerns do not stick fistula for 12 weeks.   Roxy Horseman PA-C [facility] [phone]  I have seen and evaluated the patient. I agree with the PA note as documented above.  Postop day 1 status post right first stage brachiobasilic fistula.  Excellent thrill this morning.  Incision looks good.  Palpable radial pulse.  No signs of steal.  We will arrange follow-up in 4 weeks with fistula duplex and then will need second stage transposition as discussed with patient.  We will sign off.  Please call with questions or concerns.  She has a TDC for immediate dialysis needs  Marty Heck, MD [facility] [place] Office: [phone]

## 2021-01-16 NOTE — Progress Notes (Signed)
Occupational Therapy Treatment Patient Details Name: Autumn [name] MRN: [id number] DOB: August 22, 1950 Today's Date: 01/16/2021   History of present illness Pt is a 71 y/o female admitted 12/29 c SOB.  +COVID, with pneumonia,  acute on chronic renal failure, AKI, UTI.  GIB 1/1. Nursing cleared today for PT eval.  PMH includes: DM2, HTN, NASH cirrhosis, afib, CVA, CKD4, L LE wound/vac post amp of L foot 2nd and 3rd met heads   OT comments  Pt noted in the start of session limited initiation of RUE with tactile and verbal cues then requiring hand over hand to assist (noted in 12/6 limited use in RUE). Pt required max assist for UE ADLS and total for LE. Pt when completing sitting at EOB limited tolerance as requiring mod assist as R lean and max cues. Pt limited as reported starting to feel ill but no significant changes in BP with positional changes.  Pt currently with functional limitations due to the deficits listed below (see OT Problem List).  Pt will benefit from skilled OT to increase their safety and independence with ADL and functional mobility for ADL to facilitate discharge to venue listed below.     Recommendations for follow up therapy are one component of a multi-disciplinary discharge planning process, led by the attending physician.  Recommendations may be updated based on patient status, additional functional criteria and insurance authorization.    Follow Up Recommendations  Skilled nursing-short term rehab (<3 hours/day)    Assistance Recommended at Discharge Frequent or constant Supervision/Assistance  Patient can return home with the following      Equipment Recommendations       Recommendations for Other Services      Precautions / Restrictions Precautions Precautions: Fall Precaution Comments: wound vac and post op shoe to L LE Restrictions Weight Bearing Restrictions: Yes LLE Weight Bearing: Weight bearing as tolerated Other Position/Activity Restrictions: post  op shoe on LLE       Mobility Bed Mobility Overal bed mobility: Needs Assistance Bed Mobility: Supine to Sit;Sit to Supine;Rolling Rolling: Mod assist Sidelying to sit: Mod assist Supine to sit: Mod assist;+2 for physical assistance;+2 for safety/equipment;HOB elevated Sit to supine: Max assist;+2 for physical assistance;+2 for safety/equipment Sit to sidelying: Mod assist General bed mobility comments: Pt cued to flex knee and reach to bed rail to roll each direction, modA. ModAx2 to initiate leg movement off L EOB, good initiation noted with L leg compared to R with tactile cues, and to bring R UE across to L bed rail to pull trunk to ascend and scoot to EOB. MaxAx2 to manage legs and trunk back to supine as pt became nauseated sitting EOB.    Transfers Overall transfer level: Needs assistance Equipment used: None Transfers: Bed to chair/wheelchair/BSC Sit to Stand: Mod assist;From elevated surface;+2 physical assistance     Squat pivot transfers: Total assist Anterior-Posterior transfers: Min guard  Lateral/Scoot Transfers: Max assist;+2 physical assistance;+2 safety/equipment General transfer comment: Pt able to display some minor initiation to clear buttocks with x1 lateral scoot to L on EOB, maxAx2.     Balance Overall balance assessment: Needs assistance Sitting-balance support: Feet supported;Single extremity supported;Bilateral upper extremity supported Sitting balance-Leahy Scale: Poor Sitting balance - Comments: UE support and pt with tendency to lean posteriorly and laterally to R. Pt able to maintain static sitting balance unsupported for ~2-3 sec before requiring min-modA to prevent LOB at EOB. Tactile and verbal cues provided to facilitate L lateral trunk flexion, anterior pelvic tilt, and  scapular adduction to improve midline posture, maintaining briefly without assistance. Postural control: Posterior lean;Right lateral lean Standing balance support: Bilateral upper  extremity supported Standing balance-Leahy Scale: Poor Standing balance comment: Unable to stand today                           ADL either performed or assessed with clinical judgement   ADL Overall ADL's : Needs assistance/impaired Eating/Feeding: Moderate assistance;Cueing for safety;Cueing for sequencing;Sitting   Grooming: Wash/dry hands;Wash/dry face;Maximal assistance;Sitting;Bed level   Upper Body Bathing: Maximal assistance;Cueing for safety;Cueing for sequencing;Sitting;Bed level   Lower Body Bathing: Total assistance;Cueing for safety;Cueing for sequencing   Upper Body Dressing : Maximal assistance;Cueing for safety;Cueing for sequencing;Sitting;Bed level   Lower Body Dressing: Total assistance;Bed level       Toileting- Clothing Manipulation and Hygiene: Total assistance;Bed level         General ADL Comments: Attempted latteral scotting but started to vomit    Extremity/Trunk Assessment Upper Extremity Assessment Upper Extremity Assessment: RUE deficits/detail;LUE deficits/detail RUE Deficits / Details: Pt noted when attempting to cue to use RUE unable to intiate without hand over hand and verbal cues and increase in time. It was noted from 12/6 changes in RUE. Pt at this time noted to have AROM at wrist and distally post cued and hand over hand. However, proximally they required AAROM. RUE Coordination: decreased fine motor;decreased gross motor LUE Deficits / Details: Pt increase in AROM at elbow and distally but limited AROM at shoulder as crepitus and PROM to about 160 at shoulder flexion LUE Coordination: decreased fine motor;decreased gross motor   Lower Extremity Assessment Lower Extremity Assessment: Defer to PT evaluation RLE Deficits / Details: 3/5 RLE strength LLE Deficits / Details: L knee and hip were 2/5 strength, L ankle in PF contracture        Vision       Perception     Praxis      Cognition Arousal/Alertness:  Awake/alert Behavior During Therapy: WFL for tasks assessed/performed;Anxious Overall Cognitive Status: Impaired/Different from baseline Area of Impairment: Problem solving;Awareness;Safety/judgement;Following commands;Memory;Attention                 Orientation Level: Place;Time;Situation Current Attention Level: Sustained Memory: Decreased recall of precautions;Decreased short-term memory Following Commands: Follows one step commands inconsistently;Follows one step commands with increased time Safety/Judgement: Decreased awareness of safety;Decreased awareness of deficits Awareness: Intellectual Problem Solving: Slow processing;Decreased initiation;Difficulty sequencing;Requires verbal cues;Requires tactile cues General Comments: Pt with increased processing and initiation time, especially when motor planning R side of body (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 Poor awareness and attention to R side also. Poor awareness into deficits and problem-solving how to fix them, like sitting posture. Follows simple commands inconsistently, pointing feet into plantarflexion repeatedly even though max cues to dorsiflex          Exercises Exercises: Other exercises General Exercises - Upper Extremity Shoulder Flexion: AAROM;Right;5 reps;Seated Other Exercises Other Exercises: PROM and AAROM to bil ankles into dorsiflexion Other Exercises: Gentle massage to reduce edema in lower extremities Other Exercises: placed order for hard grey PRAFOs for bil ankles   Shoulder Instructions       General Comments VSS on RA, BP stable throughout even though pt nauseated sitting EOB (possibly due to not eating for ~24 hrs)    Pertinent Vitals/ Pain       Pain Assessment: Faces Faces Pain Scale: Hurts little more Breathing: occasional labored breathing,  short period of hyperventilation Negative Vocalization: none Facial Expression: sad, frightened, frown Body Language: tense,  distressed pacing, fidgeting Consolability: distracted or reassured by voice/touch PAINAD Score: 4 Facial Expression: Relaxed, neutral Body Movements: Absence of movements Muscle Tension: Relaxed Compliance with ventilator (intubated pts.): N/A Vocalization (extubated pts.): N/A CPOT Total: 0 Pain Location: generalized with mobility and ROM to bil UE and bil ankles into dosiflexion Pain Descriptors / Indicators: Discomfort;Grimacing;Guarding Pain Intervention(s): Limited activity within patient's tolerance;Monitored during session;Repositioned  Home Living                                          Prior Functioning/Environment              Frequency  Min 2X/week        Progress Toward Goals  OT Goals(current goals can now be found in the care plan section)  Progress towards OT goals: Progressing toward goals  Acute Rehab OT Goals Patient Stated Goal: to rest OT Goal Formulation: With patient Time For Goal Achievement: 01/27/21 Potential to Achieve Goals: Fair ADL Goals Pt Will Perform Grooming: with set-up;sitting Pt Will Perform Upper Body Bathing: with min assist;sitting Pt Will Perform Lower Body Dressing: with mod assist;sitting/lateral leans Pt Will Transfer to Toilet: squat pivot transfer;bedside commode;with max assist Pt/caregiver will Perform Home Exercise Program: Increased strength;Increased ROM;Both right and left upper extremity;With written HEP provided;With minimal assist Additional ADL Goal #1: Pt will complete rolling and bed mobility with no more than min assist as precursor to ADLs.  Plan Discharge plan remains appropriate;Frequency needs to be updated    Co-evaluation    PT/OT/SLP Co-Evaluation/Treatment: Yes Reason for Co-Treatment: Complexity of the patient's impairments (multi-system involvement);Necessary to address cognition/behavior during functional activity;For patient/therapist safety;To address functional/ADL  transfers PT goals addressed during session: Mobility/safety with mobility;Balance;Strengthening/ROM OT goals addressed during session: ADL's and self-care      AM-PAC OT "6 Clicks" Daily Activity     Outcome Measure   Help from another person eating meals?: A Little Help from another person taking care of personal grooming?: A Lot Help from another person toileting, which includes using toliet, bedpan, or urinal?: Total Help from another person bathing (including washing, rinsing, drying)?: A Lot Help from another person to put on and taking off regular upper body clothing?: A Lot Help from another person to put on and taking off regular lower body clothing?: Total 6 Click Score: 11    End of Session    OT Visit Diagnosis: Other abnormalities of gait and mobility (R26.89);Muscle weakness (generalized) (M62.81) Hemiplegia - Right/Left: Right Hemiplegia - dominant/non-dominant: Dominant   Activity Tolerance Patient tolerated treatment well   Patient Left in bed;with call bell/phone within reach;with bed alarm set   Nurse Communication Mobility status        Time: 1950-9326 OT Time Calculation (min): 29 min  Charges: OT General Charges $OT Visit: 1 Visit OT Treatments $Self Care/Home Management : 23-37 mins  Joeseph Amor OTR/L  Acute Rehab Services  [phone] office number [phone] pager number   Joeseph Amor 01/16/2021, 10:17 AM

## 2021-01-16 NOTE — Progress Notes (Signed)
Orthopedic Tech Progress Note Patient Details:  Autumn [name] Dec 05, 1950 [id number]  Pt was not in room when we arrived, [facility] boots are at bedside.  Ortho Devices Type of Ortho Device: Prafo boot/shoe (Bilateral) Ortho Device/Splint Location: at bedside Ortho Device/Splint Interventions: Ordered   Post Interventions Patient Tolerated: Well Instructions Provided: Care of device  Jamecia Lerman Jeri Modena 01/16/2021, 12:11 PM

## 2021-01-16 NOTE — TOC Progression Note (Signed)
Transition of Care [facility]) - Progression Note    Patient Details  Name: Autumn [name] MRN: [id number] Date of Birth: 03-04-1950  [facility] CM/SW Contact  Autumn [name], [place] Phone Number: 01/16/2021, 12:18 PM  Clinical Narrative:   Autumn [name] to Autumn [name] in admissions for [facility] who confirms they are not able to accept pt back with HD. Pt was STR at [place]. PT continues to recommend SNF for rehab and pt is agreeable. New SNF search started, will f/u with offers as available. Will need outpt HD arranged once SNF chosen. Pt will also need auth for SNF.   Autumn Feinstein, MSW, LCSW [phone] (coverage)      Expected Discharge Plan: [place] Barriers to Discharge: Continued Medical Work up  Expected Discharge Plan and Services Expected Discharge Plan: [place] In-house Referral: Clinical Social Work   Post Acute Care Choice: [place] Living arrangements for the past 2 months: [place]                                       Social Determinants of Health (SDOH) Interventions    Readmission Risk Interventions No flowsheet data found.

## 2021-01-16 NOTE — Progress Notes (Signed)
Attempted to reach pt several times via phone due to pt's covid diagnosis but have been unsuccessful. Will continue efforts to reach pt to discuss out-pt HD needs. Chart reviewed and therapy recs for snf noted. Will need to coordinate out-pt HD clinic according to which snf pt admits to at d/c. Contacted TOC staff and will continue to follow and assist.    Melven Sartorius Renal Navigator [phone]

## 2021-01-16 NOTE — Progress Notes (Signed)
HD#7 SUBJECTIVE:  Patient Summary: Autumn [name] is a 71 y.o. 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 She reports that she is feeling well, but she just feels tired. She denies any further shortness of breath. Denies any dizziness or lightheadedness.    OBJECTIVE:  Vital Signs: Vitals:   01/16/21 0930 01/16/21 1000 01/16/21 1030 01/16/21 1100  BP: 125/75 112/76 115/68 124/67  Pulse: 94 94 (!) 105 98  Resp: 17 19 20 14   Temp:      TempSrc:      SpO2:      Weight:      Height:       Supplemental O2: Room Air SpO2: 94 %   Filed Weights   01/15/21 1215 01/16/21 0000 01/16/21 0854  Weight: 87.5 kg 86.7 kg 87.1 kg     Intake/Output Summary (Last 24 hours) at 01/16/2021 1122 Last data filed at 01/16/2021 0820 Gross per 24 hour  Intake 470 ml  Output 1110 ml  Net -640 ml   Net IO Since Admission: 468.64 mL [01/16/21 1122]  Physical Exam: General: Chronically ill appearing female laying in bed. No acute distress. Neck: R IJ tunneled cath in place  CV: Regular rate, irregular rhythm, no murmurs. 1-2+ b/l pitting edema  Pulmonary: Diminished breath sounds in b/l lung bases. Normal WOB on room air Abdominal: Soft, nontender, nondistended.  Extremities: Palpable radial pulses. 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 1st stage R AV fistula in place Skin: Warm and dry.  Neuro: A&Ox3. No focal deficit. Psych: Normal mood and affect    ASSESSMENT/PLAN:  Assessment: Principal Problem:   COVID-19 Active Problems:   Atrial fibrillation with RVR (HCC)   Acute kidney  injury superimposed on CKD (HCC)   Liver cirrhosis secondary to NASH (nonalcoholic steatohepatitis) (HCC)   Anemia of chronic disease   Type II diabetes mellitus (HCC)   GI bleeding   Plan: #AKI on CKD IV #Uremic bleeding Patient had temporary HD cath placed on 1/3 and R AV fistula placed on 1/4; received first dialysis session on 1/4 and tolerated it well with 1L fluid removed. Plan for another HD session today with UF goal of 1.5L. Urine output continues to decline, with only 100 cc urine output in the last 24 hrs.  - Nephro consulted, appreciate recs - HD again today - Strict I&Os and daily weights - Avoid nephrotoxic agents - Will need to coordinate outpatient HD with SNF placement  #Acute on chronic anemia #GI bleed vs hemorrhoidal bleeding, stable #NASH Cirrhosis  Patient is s/p 3u of PRBC this admission. GI bleeding appears to have stopped, with no further episodes of melena or hematochezia in 48 hrs. Hb remains stable on CBC at 7.6. Discontinued ceftriaxone ppx on 1/4. Likely will not need to pursue flexible sigmoidoscopy at this time, given improvement in bleeding. - Continue hydrocortisone suppositories bid - Protonix 40 mg bid - Resume DVT prophylaxis today, will resume full anticoagulation if bleeding does not resume - Monitor CBC  #Thrombocytopenia likely 2/2 to cirrhosis Platelets continue to down trend, from 80 to 74 today.  - Monitor CBC  #COVID  pneumonia Overall remains clinically stable from a respiratory standpoint and remains off of supplemental oxygen. Received 6 days of decadron 6 mg.   Best Practice: Diet: Renal diet IVF: Fluids: none VTE: heparin injection 5,000 Units Start: 01/16/21 1400 Code: Full AB: None Therapy Recs: SNF Family Contact: Attempted to contact patient's friend, [name] DISPO: Anticipated discharge to Skilled nursing facility pending Medical stability.  Signature: Buddy Duty, D.O.  Internal Medicine Resident, PGY-1 [facility] Res[facility]  Pager: [phone] 11:22 AM, 01/16/2021   Please contact the on call pager after 5 pm and on weekends at [phone].

## 2021-01-16 NOTE — Progress Notes (Signed)
[facility] Progress Note   71 y.o. female with a past medical history significant for DM2, HTN, hypothyroidism, NASH cirrhosis, pAfib, CVA, admitted for COVID pneumonia c/b AKI on top of advanced CKD4.     Assessment/ Plan:   1) AKI on CKD 4: Baseline CKD likely arterionephrosclerosis with diabetic kidney disease and possibly some contribution of HRS given cirrhosis although less likely.    UA does show some protein but not extensive.  CKD has been progressive also with recent AKI during hospitalization for osteomyelitis.  Baseline creatinine around 3.6-4 at this time.   Now with AKI likely secondary to ATN associated with COVID.  Creatinine continues to slowly worsen with levels >7.  Patient  hesitant to start dialysis but finally agreed to it stating you don't know till you try. HD #1 on 1/4  Appreciate VIR tunneled catheter for initiation of dialysis; on for HD #2 today. HD #3 on Sat.  Appreciate Dr. Carlis Abbott VVS seeing the patient in preparation for permanent access in rt arm and placing a BBF so promptly. Beautiful thrill today.  CLIP; renal navigator aware.  -Continue to monitor daily Cr, Dose meds for GFR -Monitor Daily I/Os, Daily weight  -Maintain MAP>65 for optimal renal perfusion.  -Avoid nephrotoxic medications including NSAIDs.   2) Acute hypoxic respiratory failure secondary to COVID-pneumonia: overall improved.  Continue management per primary team.     3) Anemia: Likely multifactorial with acute illness, chronic inflammation, CKD contributing.  Continue transfusion per primary team. Will check iron stores and consider ESA. May not respond to ESA with the inflammatory state - S/P  transfusion; GI believes it's more c/w hemorrhoidal bleeding and no scope indicated.   4) Metabolic acidosis: Mixed anion gap and non-anion gap likely secondary to acute kidney injury.  changed  sodium bicarb to 650 mg 2 times daily.  Should be able to d/c soon now that pt is on hd.    5) UTI: Pyuria on urinalysis.  Minimal symptoms were because of AKI we will treat.  Ceftriaxone 1 g x 5 days.     6) Atrial fibrillation: tachycardic. Management per primary  7) Left foot wound: Status post amputation, wound open.  Management per primary  8) Diabetes Mellitus Type 2 with Hyperglycemia: Management per primary      Subjective:   Denies  shortness of breath and cough but still very poor appetitie.  Denies further  blood from rectum. Denies f/c.   Objective:   BP (!) 127/91    Pulse 99    Temp (!) 97.4 F (36.3 C) (Oral)    Resp 19    Ht 5\' 7"  (1.702 m)    Wt 86.7 kg    SpO2 95%    BMI 29.94 kg/m   Intake/Output Summary (Last 24 hours) at 01/16/2021 6333 Last data filed at 01/16/2021 0820 Gross per 24 hour  Intake 470 ml  Output 1110 ml  Net -640 ml   Weight change: 0.4 kg  Physical Exam: Constitutional: Chronically ill-appearing, lying in bed, no distress ENMT: ears and nose without scars or lesions, dry mucous membranes CV: tachy  Respiratory: Bilateral chest rise with no increased work of breathing Gastrointestinal: soft, non-tender, no palpable masses or hernias Skin: no visible lesions or rashes Psych: alert, judgement/insight appropriate, appropriate mood and affect Ext: 1+ edema b/l Access: RIJ TC  Imaging: IR Fluoro Guide CV Line Right  Result Date: 01/14/2021 INDICATION: Long-term dialysis access EXAM: 1. Ultrasound-guided puncture of the right internal jugular  vein 2. Placement of a tunneled hemodialysis catheter using fluoroscopic guidance MEDICATIONS: None ANESTHESIA/SEDATION: Moderate (conscious) sedation was employed during this procedure. A total of Versed 0.5 mg and Fentanyl 25 mcg was administered intravenously. Moderate Sedation Time: 22 minutes. [name] level of consciousness and vital signs were monitored continuously by radiology nursing throughout the procedure under my direct supervision. FLUOROSCOPY TIME:  Fluoroscopy Time: 2 minutes 8  seconds with 2 exposures COMPLICATIONS: None immediate. PROCEDURE: Informed written consent was obtained from the patient after a thorough discussion of the procedural risks, benefits and alternatives. All questions were addressed. Maximal Sterile Barrier Technique was utilized including caps, mask, sterile gowns, sterile gloves, sterile drape, hand hygiene and skin antiseptic. A timeout was performed prior to the initiation of the procedure. The patient was placed supine on the exam table. The right neck and chest was prepped and draped in the standard sterile fashion. Ultrasound was used to evaluate the right internal jugular vein, which found to be patent and suitable for access. An ultrasound image was permanently stored in the electronic medical record. Using ultrasound guidance, the right internal jugular vein was directly punctured using a 21 gauge micropuncture set. An 018 wire was advanced into the SVC, followed by serial tract dilation and placement of an 035 wire which was advanced into the IVC. Attention was then turned to an infraclavicular location, where a small dermatotomy was made after the administration of additional local anesthetic. From this location, a 19 cm tip-to-cuff hemodialysis catheter was tunneled to the venotomy site. A peel-away sheath was then advanced over the access wire. Through this peel-away sheath, the hemodialysis catheter was advanced into the central veins, such that the tip was positioned near the superior cavoatrial junction. The line was found to flush and aspirate appropriately. It was sutured to the skin using 0 Prolene suture, and the venotomy was closed with Dermabond. A sterile dressing was placed. The patient tolerated the procedure well without immediate complication. IMPRESSION: Successful placement of a tunneled hemodialysis catheter via the right internal jugular vein. The line is ready for immediate use. Electronically Signed   By: Albin Felling M.D.   On:  01/14/2021 10:21   IR US Guide Vasc Access Right  Result Date: 01/14/2021 INDICATION: Long-term dialysis access EXAM: 1. Ultrasound-guided puncture of the right internal jugular vein 2. Placement of a tunneled hemodialysis catheter using fluoroscopic guidance MEDICATIONS: None ANESTHESIA/SEDATION: Moderate (conscious) sedation was employed during this procedure. A total of Versed 0.5 mg and Fentanyl 25 mcg was administered intravenously. Moderate Sedation Time: 22 minutes. [name] level of consciousness and vital signs were monitored continuously by radiology nursing throughout the procedure under my direct supervision. FLUOROSCOPY TIME:  Fluoroscopy Time: 2 minutes 8 seconds with 2 exposures COMPLICATIONS: None immediate. PROCEDURE: Informed written consent was obtained from the patient after a thorough discussion of the procedural risks, benefits and alternatives. All questions were addressed. Maximal Sterile Barrier Technique was utilized including caps, mask, sterile gowns, sterile gloves, sterile drape, hand hygiene and skin antiseptic. A timeout was performed prior to the initiation of the procedure. The patient was placed supine on the exam table. The right neck and chest was prepped and draped in the standard sterile fashion. Ultrasound was used to evaluate the right internal jugular vein, which found to be patent and suitable for access. An ultrasound image was permanently stored in the electronic medical record. Using ultrasound guidance, the right internal jugular vein was directly punctured using a 21 gauge micropuncture set. An  018 wire was advanced into the SVC, followed by serial tract dilation and placement of an 035 wire which was advanced into the IVC. Attention was then turned to an infraclavicular location, where a small dermatotomy was made after the administration of additional local anesthetic. From this location, a 19 cm tip-to-cuff hemodialysis catheter was tunneled to the venotomy  site. A peel-away sheath was then advanced over the access wire. Through this peel-away sheath, the hemodialysis catheter was advanced into the central veins, such that the tip was positioned near the superior cavoatrial junction. The line was found to flush and aspirate appropriately. It was sutured to the skin using 0 Prolene suture, and the venotomy was closed with Dermabond. A sterile dressing was placed. The patient tolerated the procedure well without immediate complication. IMPRESSION: Successful placement of a tunneled hemodialysis catheter via the right internal jugular vein. The line is ready for immediate use. Electronically Signed   By: Albin Felling M.D.   On: 01/14/2021 10:21   VAS [place] UPPER EXT VEIN MAPPING (PRE-OP AVF)  Result Date: 01/14/2021 UPPER EXTREMITY VEIN MAPPING Patient Name:  Autumn [name]  Date of Exam:   01/14/2021 Medical Rec #: [id number]           Accession #:    [id number] Date of Birth: 1950/03/14           Patient Gender: F Patient Age:   80 years Exam Location:  [facility] Procedure:      VAS [place] UPPER EXT VEIN MAPPING (PRE-OP AVF) Referring Phys: Otelia Santee --------------------------------------------------------------------------------  Indications: Pre-access. Comparison Study: no prior Performing Technologist: Archie Patten RVS  Examination Guidelines: A complete evaluation includes B-mode imaging, spectral Doppler, color Doppler, and power Doppler as needed of all accessible portions of each vessel. Bilateral testing is considered an integral part of a complete examination. Limited examinations for reoccurring indications may be performed as noted. +-----------------+-------------+----------+---------+  Right Cephalic    Diameter (cm) Depth (cm) Findings   +-----------------+-------------+----------+---------+  Shoulder              0.32         1.13               +-----------------+-------------+----------+---------+  Prox upper arm        0.23         1.08                +-----------------+-------------+----------+---------+  Mid upper arm         0.24         0.65               +-----------------+-------------+----------+---------+  Dist upper arm        0.26         0.89               +-----------------+-------------+----------+---------+  Antecubital fossa     0.25         0.52    branching  +-----------------+-------------+----------+---------+  Prox forearm          0.19         0.47               +-----------------+-------------+----------+---------+  Mid forearm           0.19         0.81               +-----------------+-------------+----------+---------+  Dist forearm  0.19         0.49    branching  +-----------------+-------------+----------+---------+  Wrist                 0.09         0.63               +-----------------+-------------+----------+---------+ +-----------------+-------------+----------+---------+  Right Basilic     Diameter (cm) Depth (cm) Findings   +-----------------+-------------+----------+---------+  Prox upper arm        0.40         1.01               +-----------------+-------------+----------+---------+  Mid upper arm         0.49         0.97               +-----------------+-------------+----------+---------+  Dist upper arm        0.38         0.88               +-----------------+-------------+----------+---------+  Antecubital fossa     0.30         0.72               +-----------------+-------------+----------+---------+  Prox forearm          0.21         0.64    branching  +-----------------+-------------+----------+---------+  Mid forearm           0.16         0.50               +-----------------+-------------+----------+---------+  Distal forearm        0.17         0.53               +-----------------+-------------+----------+---------+ +-----------------+-------------+----------+--------------+  Left Cephalic     Diameter (cm) Depth (cm)    Findings     +-----------------+-------------+----------+--------------+   Shoulder              0.17         1.34                    +-----------------+-------------+----------+--------------+  Prox upper arm        0.17         0.90                    +-----------------+-------------+----------+--------------+  Mid upper arm         0.15         0.68                    +-----------------+-------------+----------+--------------+  Dist upper arm        0.20         0.60                    +-----------------+-------------+----------+--------------+  Antecubital fossa     0.22         0.62      branching     +-----------------+-------------+----------+--------------+  Prox forearm                               not visualized  +-----------------+-------------+----------+--------------+  Mid forearm  not visualized  +-----------------+-------------+----------+--------------+  Dist forearm                               not visualized  +-----------------+-------------+----------+--------------+  Wrist                                      not visualized  +-----------------+-------------+----------+--------------+ +-----------------+-------------+----------+--------------+  Left Basilic      Diameter (cm) Depth (cm)    Findings     +-----------------+-------------+----------+--------------+  Prox upper arm        0.35         1.40                    +-----------------+-------------+----------+--------------+  Mid upper arm         0.32         1.19                    +-----------------+-------------+----------+--------------+  Dist upper arm        0.32         1.37                    +-----------------+-------------+----------+--------------+  Antecubital fossa     0.23         1.31      branching     +-----------------+-------------+----------+--------------+  Prox forearm          0.10         0.44      branching     +-----------------+-------------+----------+--------------+  Mid forearm                                not visualized   +-----------------+-------------+----------+--------------+  Distal forearm                             not visualized  +-----------------+-------------+----------+--------------+ *See table(s) above for measurements and observations.  Diagnosing physician: Deitra Mayo MD Electronically signed by Deitra Mayo MD on 01/14/2021 at 5:59:30 PM.    Final     Labs: BMET Recent Labs  Lab 01/10/21 1610 01/11/21 9604 01/12/21 5409 01/13/21 0056 01/14/21 0036 01/15/21 0125 01/16/21 0420  NA 139 137 138 139 140 140 138  K 4.0 4.0 3.8 4.1 4.2 4.5 4.1  CL 108 107 108 104 105 106 104  CO2 14* 14* 19* 19* 20* 19* 22  GLUCOSE 112* 131* 117* 121* 168* 159* 107*  BUN 85* 95* 96* 102* 106* 109* 65*  CREATININE 6.65* 6.79* 6.98* 7.03* 7.11* 7.09* 4.82*  CALCIUM 8.8* 8.8* 8.4* 8.4* 8.5* 8.6* 8.2*  PHOS 6.0* 5.5* 4.9* 5.3* 5.4*  --  5.0*   CBC Recent Labs  Lab 01/10/21 0629 01/10/21 1622 01/11/21 0635 01/12/21 0610 01/12/21 1646 01/13/21 0056 01/13/21 1131 01/14/21 0036 01/14/21 1020 01/15/21 0125 01/16/21 0420  WBC 2.7*  --  6.8 4.4   < > 11.7*   < > 6.7 10.1 5.9 8.2  NEUTROABS 2.0  --  5.4 3.5  --  10.2*  --   --   --   --   --   HGB 6.7*   < > 8.1* 7.4*   < > 8.3*   8.3*   < > 6.7* 8.8*  7.6* 7.6*  HCT 21.3*   < > 25.5* 23.6*   < > 25.4*   25.7*   < > 20.3* 26.3* 23.4* 23.1*  MCV 94.2  --  89.8 89.7   < > 86.7   < > 86.0 87.7 90.3 89.9  PLT 132*  --  194 PLATELET CLUMPS NOTED ON SMEAR, UNABLE TO ESTIMATE   < > 115*   < > 102* 122* 80* 74*   < > = values in this interval not displayed.    Medications:     sodium chloride   Intravenous Once   atorvastatin  40 mg Oral Daily   chlorhexidine  15 mL Mouth/Throat Once   Or   mouth rinse  15 mL Mouth Rinse Once   Chlorhexidine Gluconate Cloth  6 each Topical Daily   diltiazem  180 mg Oral Daily   feeding supplement  237 mL Oral TID BM   hydrocortisone  25 mg Rectal BID   insulin aspart  0-15 Units Subcutaneous Q4H    levothyroxine  100 mcg Oral Q0600   metoprolol tartrate  50 mg Oral BID   multivitamin with minerals  1 tablet Oral Daily   pantoprazole  40 mg Oral BID   sodium bicarbonate  1,300 mg Oral TID      Otelia Santee, MD 01/16/2021, 8:33 AM

## 2021-01-16 NOTE — Progress Notes (Signed)
Physical Therapy Treatment Patient Details Name: Autumn [name] MRN: [id number] DOB: 03/02/1950 Today's Date: 01/16/2021   History of Present Illness Pt is a 71 y/o female admitted 12/29 c SOB.  +COVID, with pneumonia,  acute on chronic renal failure, AKI, UTI.  GIB 1/1. Nursing cleared today for PT eval.  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 Pt requires mod-maxAx2 for bed mobility and maxAx2 to scoot laterally on EOB this date. Focused session on reducing edema in lower extremities, managing bil ankle dorsiflexion contractures, and facilitating core activation for improved midline alignment sitting EOB. Pt was able to sit statically unsupported for ~2-3 sec durations, but otherwise required min-modA due to her posterior and R lateral lean. Pt became nauseated sitting EOB, but BP and vitals were stable. Will continue to follow acutely. Current recommendations remain appropriate.    Recommendations for follow up therapy are one component of a multi-disciplinary discharge planning process, led by the attending physician.  Recommendations may be updated based on patient status, additional functional criteria and insurance authorization.  Follow Up Recommendations  Skilled nursing-short term rehab (<3 hours/day)     Assistance Recommended at Discharge Frequent or constant Supervision/Assistance  Patient can return home with the following Two people to help with walking and/or transfers;A lot of help with bathing/dressing/bathroom;Two people to help with bathing/dressing/bathroom;A lot of help with walking and/or transfers;Assistance with feeding;Assistance with cooking/housework;Direct supervision/assist for  medications management;Direct supervision/assist for financial management;Assist for transportation;Help with stairs or ramp for entrance   Equipment Recommendations  None recommended by PT    Recommendations for Other Services       Precautions / Restrictions Precautions Precautions: Fall Precaution Comments: wound vac and post op shoe to L LE Restrictions Weight Bearing Restrictions: Yes LLE Weight Bearing: Weight bearing as tolerated Other Position/Activity Restrictions: post op shoe on LLE     Mobility  Bed Mobility Overal bed mobility: Needs Assistance Bed Mobility: Supine to Sit;Sit to Supine;Rolling Rolling: Mod assist   Supine to sit: Mod assist;+2 for physical assistance;+2 for safety/equipment;HOB elevated Sit to supine: Max assist;+2 for physical assistance;+2 for safety/equipment   General bed mobility comments: Pt cued to flex knee and reach to bed rail to roll each direction, modA. ModAx2 to initiate leg movement off L EOB, good initiation noted with L leg compared to R with tactile cues, and to bring R UE across to L bed rail to pull trunk to ascend and scoot to EOB. MaxAx2 to manage legs and trunk back to supine as pt became nauseated sitting EOB.    Transfers Overall transfer level: Needs assistance Equipment used: None              Lateral/Scoot Transfers: Max assist;+2 physical assistance;+2 safety/equipment General transfer comment: Pt able to display some minor initiation to clear buttocks with x1 lateral scoot to L on EOB, maxAx2.    Ambulation/Gait               General Gait Details: deferred   Stairs             Wheelchair Mobility    Modified Rankin (Stroke Patients Only)       Balance Overall balance assessment: Needs assistance Sitting-balance  support: Feet supported;Single extremity supported;Bilateral upper extremity supported Sitting balance-Leahy Scale: Poor Sitting balance - Comments: UE support and pt with  tendency to lean posteriorly and laterally to R. Pt able to maintain static sitting balance unsupported for ~2-3 sec before requiring min-modA to prevent LOB at EOB. Tactile and verbal cues provided to facilitate L lateral trunk flexion, anterior pelvic tilt, and scapular adduction to improve midline posture, maintaining briefly without assistance. Postural control: Posterior lean;Right lateral lean     Standing balance comment: Unable to stand today                            Cognition Arousal/Alertness: Awake/alert Behavior During Therapy: WFL for tasks assessed/performed;Anxious Overall Cognitive Status: Impaired/Different from baseline Area of Impairment: Problem solving;Awareness;Safety/judgement;Following commands;Memory;Attention                   Current Attention Level: Sustained Memory: Decreased recall of precautions;Decreased short-term memory Following Commands: Follows one step commands inconsistently;Follows one step commands with increased time Safety/Judgement: Decreased awareness of safety;Decreased awareness of deficits Awareness: Intellectual Problem Solving: Slow processing;Decreased initiation;Difficulty sequencing;Requires verbal cues;Requires tactile cues General Comments: Pt with increased processing and initiation time, especially when motor planning R side of body (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 Poor awareness and attention to R side also. Poor awareness into deficits and problem-solving how to fix them, like sitting posture. Follows simple commands inconsistently, pointing feet into plantarflexion repeatedly even though max cues to dorsiflex        Exercises Other Exercises Other Exercises: PROM and AAROM to bil ankles into dorsiflexion Other Exercises: Gentle massage to reduce edema in lower extremities Other Exercises: placed order for hard grey PRAFOs for bil ankles    General Comments General comments (skin  integrity, edema, etc.): VSS on RA, BP stable throughout even though pt nauseated sitting EOB (possibly due to not eating for ~24 hrs)      Pertinent Vitals/Pain Pain Assessment: Faces Faces Pain Scale: Hurts little more Pain Location: generalized with mobility and ROM to bil UE and bil ankles into dosiflexion Pain Descriptors / Indicators: Discomfort;Grimacing;Guarding Pain Intervention(s): Limited activity within patient's tolerance;Monitored during session;Repositioned    Home Living                          Prior Function            PT Goals (current goals can now be found in the care plan section) Acute Rehab PT Goals Patient Stated Goal: to get better PT Goal Formulation: With patient Time For Goal Achievement: 01/27/21 Potential to Achieve Goals: Fair Progress towards PT goals: Progressing toward goals    Frequency    Min 2X/week      PT Plan Current plan remains appropriate    Co-evaluation PT/OT/SLP Co-Evaluation/Treatment: Yes Reason for Co-Treatment: Necessary to address cognition/behavior during functional activity;For patient/therapist safety;To address functional/ADL transfers PT goals addressed during session: Mobility/safety with mobility;Balance;Strengthening/ROM        AM-PAC PT "6 Clicks" Mobility   Outcome Measure  Help needed turning from your back to your side while in a flat bed without using bedrails?: A Lot Help needed moving from lying on your back to sitting on the side of a flat bed without using bedrails?: Total Help needed moving to and from a bed to a chair (including a wheelchair)?: Total Help needed standing up from a chair using your  arms (e.g., wheelchair or bedside chair)?: Total Help needed to walk in hospital room?: Total Help needed climbing 3-5 steps with a railing? : Total 6 Click Score: 7    End of Session Equipment Utilized During Treatment: Other (comment) (L post op shoe) Activity Tolerance: Other (comment)  (limited by nausea) Patient left: in bed;with call bell/phone within reach;with bed alarm set;with nursing/sitter in room Nurse Communication: Mobility status PT Visit Diagnosis: Muscle weakness (generalized) (M62.81);Difficulty in walking, not elsewhere classified (R26.2);Other abnormalities of gait and mobility (R26.89);Unsteadiness on feet (R26.81) Hemiplegia - Right/Left: Right Hemiplegia - dominant/non-dominant: Dominant Hemiplegia - caused by: Cerebral infarction     Time: 7639-4320 PT Time Calculation (min) (ACUTE ONLY): 39 min  Charges:  $Therapeutic Exercise: 8-22 mins $Neuromuscular Re-education: 8-22 mins                     Moishe Spice, PT, DPT Acute Rehabilitation Services  Pager: [phone] Office: [phone]    Autumn [name] 01/16/2021, 9:30 AM

## 2021-01-17 DIAGNOSIS — E43 Unspecified severe protein-calorie malnutrition: Secondary | ICD-10-CM | POA: Insufficient documentation

## 2021-01-17 DIAGNOSIS — U071 COVID-19: Secondary | ICD-10-CM | POA: Diagnosis not present

## 2021-01-17 LAB — GLUCOSE, CAPILLARY
Glucose-Capillary: 133 mg/dL — ABNORMAL HIGH (ref 70–99)
Glucose-Capillary: 103 mg/dL — ABNORMAL HIGH (ref 70–99)
Glucose-Capillary: 131 mg/dL — ABNORMAL HIGH (ref 70–99)
Glucose-Capillary: 103 mg/dL — ABNORMAL HIGH (ref 70–99)

## 2021-01-17 LAB — RENAL FUNCTION PANEL
Albumin: 2.5 g/dL — ABNORMAL LOW (ref 3.5–5.0)
Anion gap: 11 (ref 5–15)
BUN: 39 mg/dL — ABNORMAL HIGH (ref 8–23)
CO2: 26 mmol/L (ref 22–32)
Calcium: 8.2 mg/dL — ABNORMAL LOW (ref 8.9–10.3)
Chloride: 100 mmol/L (ref 98–111)
Creatinine, Ser: 3.59 mg/dL — ABNORMAL HIGH (ref 0.44–1.00)
GFR, Estimated: 13 mL/min — ABNORMAL LOW (ref >60)
Glucose, Bld: 127 mg/dL — ABNORMAL HIGH (ref 70–99)
Phosphorus: 3.6 mg/dL (ref 2.5–4.6)
Potassium: 3.5 mmol/L (ref 3.5–5.1)
Sodium: 137 mmol/L (ref 135–145)

## 2021-01-17 LAB — CBC
HCT: 23.8 % — ABNORMAL LOW (ref 36.0–46.0)
Hemoglobin: 7.7 g/dL — ABNORMAL LOW (ref 12.0–15.0)
MCH: 29.5 pg (ref 26.0–34.0)
MCHC: 32.4 g/dL (ref 30.0–36.0)
MCV: 91.2 fL (ref 80.0–100.0)
Platelets: UNDETERMINED 10*3/uL (ref 150–400)
RBC: 2.61 MIL/uL — ABNORMAL LOW (ref 3.87–5.11)
RDW: 19.9 % — ABNORMAL HIGH (ref 11.5–15.5)
WBC: 8.4 10*3/uL (ref 4.0–10.5)
nRBC: 0 % (ref 0.0–0.2)

## 2021-01-17 MED ORDER — PROSOURCE PLUS PO LIQD
30.0000 mL | Freq: Two times a day (BID) | ORAL | Status: DC
  Administered 2021-01-19 – 2021-01-24 (×7): 30 mL via ORAL
  Filled 2021-01-17 (×6): qty 30

## 2021-01-17 MED ORDER — BOOST / RESOURCE BREEZE PO LIQD CUSTOM
1.0000 | Freq: Three times a day (TID) | ORAL | Status: DC
  Administered 2021-01-17 – 2021-01-24 (×10): 1 via ORAL

## 2021-01-17 MED ORDER — RENA-VITE PO TABS
1.0000 | ORAL_TABLET | Freq: Every day | ORAL | Status: DC
  Administered 2021-01-17 – 2021-01-24 (×8): 1 via ORAL
  Filled 2021-01-17 (×8): qty 1

## 2021-01-17 MED ORDER — COLLAGENASE 250 UNIT/GM EX OINT
TOPICAL_OINTMENT | Freq: Every day | CUTANEOUS | Status: DC
  Administered 2021-01-17 – 2021-01-19 (×2): 1 via TOPICAL
  Filled 2021-01-17: qty 30

## 2021-01-17 NOTE — Progress Notes (Addendum)
Received a call from telemetry stating patient had a pause of 2.13. Pt laying in bed asleep. No signs of distress noted. Will continue to monitor.

## 2021-01-17 NOTE — Consult Note (Signed)
[place] Nurse wound follow up Wound type: surgical  Measurement: 3cmx 1.5cm x0.7cm  Wound bed:95% yellow slough/5% pink at the most proximal edge  Drainage (amount, consistency, odor) none  Periwound: intact  Dressing procedure/placement/frequency: Reviewed images from 01/13/21, wound has quite a bit of slough and the VAC dressing is difficult to really only allow to touch wound base, would like to DC NPWT at this time and begin enzymatic debridement ointment to allow to clear the necrotic tissue. Will plan this today; notifying podiatry as they were following her prior to admission and started NPWT initially after surgery in October of 2022.   Orders updated, CM notified for DC planning that she will not have NPWT dressing.   Discussed POC with patient and bedside nurse.  Re consult if needed, will not follow at this time. Thanks  Deangelo Berns [employer], RN,CWOCN, CNS, [place] [phone])

## 2021-01-17 NOTE — Progress Notes (Signed)
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 Admitted with PNA due to COVID-19.  1/03 - R IJ tunneled cath for temporary dialysis access 1/04 - s/p placement of R AV fistula; HD#1 1/05 - HD #2  Discussed pt with RN. Pt is pending medical stability and placement in SNF.   Per Nephrology, pt planned for HD#3 Saturday and will then resume TTS regimen. Continues to have diminished UOP.  Pt reports ongoing poor appetite and, despite RN documentation of pt accepting Ensure, RD observed two untouched containers on pt's table and pt confirmed that she does not really enjoy Ensure. Pt prefers the [employer]. Discussed how this supplement is more limited in  protein, so pt agreeable to also using/trying Prosource given its increased protein content. Pt may need nutrition support if intake does not improve/if aligned with GOC.   Per available weight readings, pt weighed 96.2 kg last month and now weighs 90.9 kg. This indicates a clinically significant 5.5% weight loss over 1 month.   PO Intake: 0-15% x last 8 recorded meals (4% avg meal intake)   UOP: 224ml x24 hours I/O: -530ml since admit  Last HD 1/05 w/ 1L net UF  Pre-HD weight: 87.1 kg Post-HD weight: 86.1 kg Current weight: 90.9 kg Admit weight: 96.4 kg   Moderate pitting edema to BUE and deep pitting edema to BLE per RN assessment  Medications: SSI TID w/ meals and at bedtime, MVI w/ minerals, protonix  Labs: Recent Labs  Lab 01/12/21 0610 01/13/21 0056 01/14/21 0036 01/15/21 0125 01/16/21 0420 01/17/21 0805  NA 138 139 140 140 138 137  K 3.8 4.1 4.2 4.5 4.1 3.5  CL 108 104 105 106 104 100  CO2 19* 19* 20* 19* 22 26  BUN 96* 102* 106* 109* 65* 39*  CREATININE 6.98* 7.03* 7.11* 7.09* 4.82* 3.59*  CALCIUM 8.4* 8.4* 8.5* 8.6* 8.2* 8.2*  MG 2.3 2.2 2.1  --   --   --   PHOS 4.9* 5.3* 5.4*  --  5.0* 3.6  GLUCOSE 117* 121* 168* 159* 107* 127*  CBGs: 87-176 x24 hours   NUTRITION - FOCUSED PHYSICAL EXAM: Flowsheet Row Most Recent Value  Orbital Region Severe depletion  Upper Arm Region Severe depletion  Thoracic and Lumbar Region Severe depletion  Buccal Region Severe depletion  Temple Region Severe depletion  Clavicle Bone Region Severe depletion  Clavicle and Acromion Bone Region Severe depletion  Scapular Bone Region Severe depletion  Dorsal Hand Severe depletion  Patellar Region Severe depletion  Anterior Thigh Region Severe depletion  Posterior Calf Region Severe depletion  Edema (RD Assessment) Moderate  Hair Reviewed  Eyes Reviewed  Mouth Reviewed  Skin Reviewed  Nails Reviewed       Diet Order:   Diet Order             Diet renal/carb modified with  fluid restriction Diet-HS Snack? Nothing; Fluid restriction: 1200 mL Fluid; Room service appropriate? Yes; Fluid consistency: Thin  Diet effective now                   EDUCATION NEEDS:   No education needs have been identified at this time  Skin:  Skin Assessment: Skin Integrity Issues: Skin Integrity Issues:: Other (Comment) Other: MASD on buttocks  Last BM:  1/03  Height:   Ht Readings from Last 1 Encounters:  01/09/21 5\' 7"  (1.702 m)    Weight:   Wt Readings from Last 10 Encounters:  01/17/21 90.9 kg  12/21/20 96.2 kg  09/02/18 94.3 kg  03/30/17 97.5 kg    BMI:  Body mass index is 31.39 kg/m.  Estimated Nutritional Needs:   Kcal:  2000-2200  Protein:  100-115 grams  Fluid:  1L+UOP     Theone Stanley., MS, RD, LDN (she/her/hers) RD pager number and weekend/on-call pager number located in [place].

## 2021-01-17 NOTE — TOC Progression Note (Signed)
Transition of Care [facility]) - Progression Note    Patient Details  Name: Autumn [name] MRN: [id number] Date of Birth: Aug 28, 1950  Transition of Care [facility]) CM/SW [place], [place] Phone Number: 01/17/2021, 4:27 PM  Clinical Narrative:    CSW provided bed offers to pt bed offers via RN entering the room. Pt stated that she has not reviewed the bed offers yet. She was advised TOC will follow up tomorrow to get bed choice. Pt is agreeable. TOC will continue to follow for DC needs.  Expected Discharge Plan: [place] Barriers to Discharge: Continued Medical Work up  Expected Discharge Plan and Services Expected Discharge Plan: [place] In-house Referral: Clinical Social Work   Post Acute Care Choice: [place] Living arrangements for the past 2 months: [place]                                       Social Determinants of Health (SDOH) Interventions    Readmission Risk Interventions No flowsheet data found.

## 2021-01-17 NOTE — Progress Notes (Addendum)
HD#8 SUBJECTIVE:  Patient Summary: Autumn [name] is a 71 y.o. 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 She tolerated dialysis well yesterday. She states that she just feels tired today.   OBJECTIVE:  Vital Signs: Vitals:   01/16/21 1203 01/16/21 1541 01/16/21 2130 01/17/21 0000  BP: 121/89 125/77 123/62 140/74  Pulse: 100 94 82 77  Resp: 20 20 16 15   Temp: (!) 97.2 F (36.2 C) (!) 97.5 F (36.4 C) 98.2 F (36.8 C)   TempSrc: Temporal Oral Oral   SpO2: 97% 95% 95% 94%  Weight: 86.1 kg     Height:       Supplemental O2: Room Air SpO2: 94 %   Filed Weights   01/16/21 0000 01/16/21 0854 01/16/21 1203  Weight: 86.7 kg 87.1 kg 86.1 kg     Intake/Output Summary (Last 24 hours) at 01/17/2021 0556 Last data filed at 01/16/2021 1549 Gross per 24 hour  Intake 0 ml  Output 1100 ml  Net -1100 ml   Net IO Since Admission: -045.36 mL [01/17/21 0556]  Physical Exam: General: Chronically ill-appearing female laying in bed. No acute distress. Neck: R IJ tunneled cath in place CV: Regular rate, irregular rhythm. No murmurs. 1-2+ LE edema Pulmonary: Lungs CTAB. Normal effort on room air. Extremities: Palpable radial pulses. 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 1st stage R AV fistula with good thrill.  Skin: Warm and dry.  Neuro: A&Ox3. No focal deficit. Somnolent Psych: Normal mood and affect   ASSESSMENT/PLAN:  Assessment: Principal Problem:   COVID-19 Active Problems:   Atrial fibrillation with RVR (HCC)   Acute kidney injury superimposed on CKD (HCC)   Liver  cirrhosis secondary to NASH (nonalcoholic steatohepatitis) (HCC)   Anemia of chronic disease   Type II diabetes mellitus (HCC)   GI bleeding   Plan: #AKI on CKD IV #Uremic bleeding R IJ tunneled cath placed on 1/3 for temporary dialysis access and R AV fistula placed on 1/4. Had 2nd HD session yesterday yesterday with 1 L removed again. Continues to have diminished urine output, with 100 cc UOP in the last 24 hrs.  - Nephro consulted, appreciate recs - HD Saturday, then start TTS schedule - Strict I&Os - Outpatient HD needs arranged; appreciate renal navigator's assistance  #Acute on chronic anemia #GI bleed vs hemorrhoidal bleeding, resolved #NASH Cirrhosis  Patient is s/p 3u PRBC this admission. She has had no further episodes of hematochezia or melena.  Hb stable at 7.7 this morning.  - Continue hydrocortisone suppositories bid - Protonix 40 mg bid - Continue DVT prophylaxis for another day; will resume full anticoagulation tomorrow if able - Monitor CBC  #Thrombocytopenia 2/2 cirrhosis Platelets down to 74 yesterday, unable to measure today, as platelets clumped on smear.  - Daily CBC  #COVID pneumonia, improved Remains off of supplemental oxygen and is s/p 6 days of decadron therapy. Airborne and contact precautions through 1/8  Best Practice: Diet: Renal diet IVF: Fluids: none VTE: heparin injection 5,000 Units Start: 01/16/21 1400 Code: Full AB: none Therapy Recs: SNF, DME: none DISPO: Anticipated discharge to Skilled nursing facility  pending Medical stability and SNF placement.  Signature: Buddy Duty, D.O.  Internal Medicine Resident, PGY-1 [facility]  Pager: [phone] 5:56 AM, 01/17/2021   Please contact the on call pager after 5 pm and on weekends at [phone].

## 2021-01-17 NOTE — Progress Notes (Signed)
Attempted to reach pt via phone due to pt's covid diagnosis. Unable to speak with pt. Will continue efforts. Updated by CSW yesterday that pt is for new snf placement. Will clip pt to out-pt HD clinic once snf is known. Will follow and assist.   Melven Sartorius Renal Navigator [phone]

## 2021-01-17 NOTE — Care Management Important Message (Signed)
Important Message  Patient Details  Name: Autumn [name] MRN: [id number] Date of Birth: August 12, 1950   Medicare Important Message Given:  Yes     [name] 01/17/2021, 10:53 AM

## 2021-01-17 NOTE — Progress Notes (Signed)
[facility] Progress Note   71 y.o. female with a past medical history significant for DM2, HTN, hypothyroidism, NASH cirrhosis, pAfib, CVA, admitted for COVID pneumonia c/b AKI on top of advanced CKD4.     Assessment/ Plan:   1) AKI on CKD 4: Baseline CKD likely arterionephrosclerosis with diabetic kidney disease and possibly some contribution of HRS given cirrhosis although less likely.    UA does show some protein but not extensive.  CKD has been progressive also with recent AKI during hospitalization for osteomyelitis.  Baseline creatinine around 3.6-4 at this time.   Now with AKI likely secondary to ATN associated with COVID.  Creatinine continues to slowly worsen with levels >7.  Patient  hesitant to start dialysis but finally agreed to it stating you don't know till you try. HD #1 on 1/4  Appreciate VIR tunneled catheter for initiation of dialysis; tolerated HD #2 Thur. HD #3 planned for Sat and then will resume TTS regimen.  Appreciate Dr. Carlis Abbott [facility] seeing the patient in preparation for permanent access in rt arm and placing a BBF so promptly. Beautiful thrill.  CLIP; renal navigator aware.  -Continue to monitor daily Cr, Dose meds for GFR -Monitor Daily I/Os, Daily weight  -Maintain MAP>65 for optimal renal perfusion.  -Avoid nephrotoxic medications including NSAIDs.   2) Acute hypoxic respiratory failure secondary to COVID-pneumonia: overall improved.  Continue management per primary team.     3) Anemia: Likely multifactorial with acute illness, chronic inflammation, CKD contributing.  Continue transfusion per primary team. Will check iron stores and consider ESA. May not respond to ESA with the inflammatory state - S/P  transfusion; GI believes it's more c/w hemorrhoidal bleeding and no scope indicated. - With all the blood products she's likely iron replete   4) Metabolic acidosis: Mixed anion gap and non-anion gap likely secondary to acute kidney injury.  Will  d/c   sodium bicarb   5) UTI: Pyuria on urinalysis.  Minimal symptoms were because of AKI we will treat.  Ceftriaxone 1 g x 5 days.     6) Atrial fibrillation: tachycardic. Management per primary  7) Left foot wound: Status post amputation, wound open.  Management per primary  8) Diabetes Mellitus Type 2 with Hyperglycemia: Management per primary  9) Renal osteodystrophy - phos 3.6 1/6      Subjective:   Denies  shortness of breath and cough; appetite slowly improving.  Denies further  blood from rectum. Denies f/c.   Objective:   BP 135/82    Pulse 83    Temp 97.6 F (36.4 C) (Oral)    Resp 14    Ht 5\' 7"  (1.702 m)    Wt 90.9 kg    SpO2 94%    BMI 31.39 kg/m   Intake/Output Summary (Last 24 hours) at 01/17/2021 1216 Last data filed at 01/17/2021 1051 Gross per 24 hour  Intake 220 ml  Output 225 ml  Net -5 ml   Weight change: -1.4 kg  Physical Exam: Constitutional: Chronically ill-appearing, lying in bed, no distress ENMT: ears and nose without scars or lesions, dry mucous membranes CV: tachy  Respiratory: Bilateral chest rise with no increased work of breathing Gastrointestinal: soft, non-tender, no palpable masses or hernias Skin: no visible lesions or rashes Psych: alert, judgement/insight appropriate, appropriate mood and affect Ext: tr edema b/l Access: RIJ TC, rt BBF good thrill  Imaging: No results found.  Labs: BMET Recent Labs  Lab 01/11/21 [phone] 01/12/21 [phone] 01/13/21 0056 01/14/21  0036 01/15/21 0125 01/16/21 0420 01/17/21 0805  NA 137 138 139 140 140 138 137  K 4.0 3.8 4.1 4.2 4.5 4.1 3.5  CL 107 108 104 105 106 104 100  CO2 14* 19* 19* 20* 19* 22 26  GLUCOSE 131* 117* 121* 168* 159* 107* 127*  BUN 95* 96* 102* 106* 109* 65* 39*  CREATININE 6.79* 6.98* 7.03* 7.11* 7.09* 4.82* 3.59*  CALCIUM 8.8* 8.4* 8.4* 8.5* 8.6* 8.2* 8.2*  PHOS 5.5* 4.9* 5.3* 5.4*  --  5.0* 3.6   CBC Recent Labs  Lab 01/11/21 0635 01/12/21 0610 01/12/21 1646  01/13/21 0056 01/13/21 1131 01/14/21 1020 01/15/21 0125 01/16/21 0420 01/17/21 0805  WBC 6.8 4.4   < > 11.7*   < > 10.1 5.9 8.2 8.4  NEUTROABS 5.4 3.5  --  10.2*  --   --   --   --   --   HGB 8.1* 7.4*   < > 8.3*   8.3*   < > 8.8* 7.6* 7.6* 7.7*  HCT 25.5* 23.6*   < > 25.4*   25.7*   < > 26.3* 23.4* 23.1* 23.8*  MCV 89.8 89.7   < > 86.7   < > 87.7 90.3 89.9 91.2  PLT 194 PLATELET CLUMPS NOTED ON SMEAR, UNABLE TO ESTIMATE   < > 115*   < > 122* 80* 74* PLATELET CLUMPS NOTED ON SMEAR, UNABLE TO ESTIMATE   < > = values in this interval not displayed.    Medications:     atorvastatin  40 mg Oral Daily   chlorhexidine  15 mL Mouth/Throat Once   Or   mouth rinse  15 mL Mouth Rinse Once   Chlorhexidine Gluconate Cloth  6 each Topical Daily   collagenase   Topical Daily   diltiazem  180 mg Oral Daily   feeding supplement  237 mL Oral TID BM   heparin  5,000 Units Subcutaneous Q8H   hydrocortisone  25 mg Rectal BID   insulin aspart  0-15 Units Subcutaneous TID WC   insulin aspart  0-5 Units Subcutaneous QHS   levothyroxine  100 mcg Oral Q0600   metoprolol tartrate  50 mg Oral BID   multivitamin with minerals  1 tablet Oral Daily   pantoprazole  40 mg Oral BID   sodium bicarbonate  650 mg Oral BID      Otelia Santee, MD 01/17/2021, 12:16 PM

## 2021-01-17 NOTE — Progress Notes (Signed)
Received call from telemetry reporting a 2.01 second pause. Patient laying in bed asleep.

## 2021-01-18 DIAGNOSIS — U071 COVID-19: Secondary | ICD-10-CM | POA: Diagnosis not present

## 2021-01-18 LAB — COMPREHENSIVE METABOLIC PANEL
ALT: 6 U/L (ref 0–44)
AST: 18 U/L (ref 15–41)
Albumin: 2.3 g/dL — ABNORMAL LOW (ref 3.5–5.0)
Alkaline Phosphatase: 53 U/L (ref 38–126)
Anion gap: 8 (ref 5–15)
BUN: 43 mg/dL — ABNORMAL HIGH (ref 8–23)
CO2: 25 mmol/L (ref 22–32)
Calcium: 8.2 mg/dL — ABNORMAL LOW (ref 8.9–10.3)
Chloride: 104 mmol/L (ref 98–111)
Creatinine, Ser: 4.04 mg/dL — ABNORMAL HIGH (ref 0.44–1.00)
GFR, Estimated: 11 mL/min — ABNORMAL LOW (ref >60)
Glucose, Bld: 170 mg/dL — ABNORMAL HIGH (ref 70–99)
Potassium: 3.3 mmol/L — ABNORMAL LOW (ref 3.5–5.1)
Sodium: 137 mmol/L (ref 135–145)
Total Bilirubin: 0.2 mg/dL — ABNORMAL LOW (ref 0.3–1.2)
Total Protein: 4.5 g/dL — ABNORMAL LOW (ref 6.5–8.1)

## 2021-01-18 LAB — CBC
HCT: 24.2 % — ABNORMAL LOW (ref 36.0–46.0)
Hemoglobin: 7.6 g/dL — ABNORMAL LOW (ref 12.0–15.0)
MCH: 28.9 pg (ref 26.0–34.0)
MCHC: 31.4 g/dL (ref 30.0–36.0)
MCV: 92 fL (ref 80.0–100.0)
Platelets: 62 10*3/uL — ABNORMAL LOW (ref 150–400)
RBC: 2.63 MIL/uL — ABNORMAL LOW (ref 3.87–5.11)
RDW: 19.9 % — ABNORMAL HIGH (ref 11.5–15.5)
WBC: 9 10*3/uL (ref 4.0–10.5)
nRBC: 0 % (ref 0.0–0.2)

## 2021-01-18 LAB — GLUCOSE, CAPILLARY
Glucose-Capillary: 130 mg/dL — ABNORMAL HIGH (ref 70–99)
Glucose-Capillary: 102 mg/dL — ABNORMAL HIGH (ref 70–99)
Glucose-Capillary: 109 mg/dL — ABNORMAL HIGH (ref 70–99)
Glucose-Capillary: 96 mg/dL (ref 70–99)

## 2021-01-18 MED ORDER — SENNOSIDES-DOCUSATE SODIUM 8.6-50 MG PO TABS
1.0000 | ORAL_TABLET | Freq: Every day | ORAL | Status: DC
  Administered 2021-01-18 – 2021-01-19 (×2): 1 via ORAL
  Filled 2021-01-18 (×2): qty 1

## 2021-01-18 MED ORDER — HEPARIN SODIUM (PORCINE) 1000 UNIT/ML IJ SOLN
INTRAMUSCULAR | Status: AC
  Filled 2021-01-18: qty 4

## 2021-01-18 NOTE — Progress Notes (Signed)
HD#9 SUBJECTIVE:  Patient Summary: Autumn [name] is a 71 y.o. 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 She states that she just feels tired. She is eating and drinking well and has no acute complaints today.    OBJECTIVE:  Vital Signs: Vitals:   01/17/21 1617 01/17/21 2000 01/18/21 0000 01/18/21 0400  BP: 129/67 135/78 (!) 146/83   Pulse: 77 86 83   Resp: 16 16 20    Temp: 98 F (36.7 C) 98 F (36.7 C) 98 F (36.7 C) 97.7 F (36.5 C)  TempSrc: Oral Oral Oral Oral  SpO2: 96% 94% 96%   Weight:    90.5 kg  Height:       Supplemental O2: Room Air SpO2: 96 % O2 Flow Rate (L/min): 2 L/min FiO2 (%): 40 %  Filed Weights   01/16/21 1203 01/17/21 0400 01/18/21 0400  Weight: 86.1 kg 90.9 kg 90.5 kg     Intake/Output Summary (Last 24 hours) at 01/18/2021 0606 Last data filed at 01/18/2021 0277 Gross per 24 hour  Intake 457 ml  Output 225 ml  Net 232 ml   Net IO Since Admission: -399.36 mL [01/18/21 0606]  Physical Exam: General:Chronically ill appearing female laying in bed. No acute distress. CV: Regular rate, irregular rhythm. No murmurs. 2-3+ pitting edema b/l LE Pulmonary: Lungs CTAB. Normal effort. Extremities: Palpable radial pulses. 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 1st stage R AV fistula with good thrill Skin: Warm and dry.  Neuro: A&Ox3. No focal deficit. Remains somnolent Psych: Normal mood and affect    ASSESSMENT/PLAN:  Assessment: Principal Problem:   COVID-19 Active Problems:   Atrial fibrillation with RVR (HCC)   Acute kidney injury superimposed  on CKD (HCC)   Liver cirrhosis secondary to NASH (nonalcoholic steatohepatitis) (HCC)   Anemia of chronic disease   Type II diabetes mellitus (HCC)   GI bleeding   Protein-calorie malnutrition, severe   Plan: #AKI on CKD IV #Uremic bleeding R IJ tunneled cath placed on 1/3 for temporary dialysis access and R AV fistula placed on 1/4. Patient is s/p 2 HD sessions and plan for TTS schedule moving forward. ~200cc urine output in the last 24 hrs. Patient remains significantly volume overloaded on exam, with 2-3+ pitting edema in LE, which is likely related to hypoalbuminemia.  - Nephro consulted, appreciate recs - HD today - Strict I&Os - Arrange outpatient HD with SNF placement  #Acute on chronic anemia #GI bleed vs hemorrhoidal bleeding, resolved #NASH Cirrhosis  Patient is s/p 3u PRBC this admission. She has had no further episodes of hematochezia or melena. Hb stable at 7.6 this morning, although patient has not had any recent bowel movements.  - Continue hydrocortisone suppositories bid - Senokot-S daily  - Protonix 40 mg bid - [name] continue DVT ppx; hold off on anticoagulation today  #Thrombocytopenia 2/2 cirrhosis Platelets continue to downtrend, from 74 to 62 today. No active signs of bleeding noted.  - Daily CBC  #Protein calorie malnutrition Severe malnutrition is likely related to the patient's multiple chronic illnesses (cirrhosis, progression of CKD, etc). Patient with albumin of 2.3 and appears  to be third spacing on evaluation.  - Dietitian consulted, appreciate recs - Boost Breeze tid  #COVID pneumonia, improved Remains off of supplemental oxygen and is s/p 6 days of decadron therapy. Airborne and contact precautions through 1/8  Best Practice: Diet: Renal diet IVF: Fluids: none VTE: heparin injection 5,000 Units Start: 01/16/21 1400 Code: Full AB: none Therapy Recs: SNF, DME: none DISPO: Anticipated discharge to Skilled nursing facility pending  medical  stability and placement .  Signature: Buddy Duty, D.O.  Internal Medicine Resident, PGY-1 [facility]  Pager: [phone] 6:06 AM, 01/18/2021   Please contact the on call pager after 5 pm and on weekends at [phone].

## 2021-01-18 NOTE — Progress Notes (Signed)
Transport here to take patient to hemodialysis.

## 2021-01-18 NOTE — Progress Notes (Signed)
[facility] Progress Note   71 y.o. female with a past medical history significant for DM2, HTN, hypothyroidism, NASH cirrhosis, pAfib, CVA, admitted for COVID pneumonia c/b AKI on top of advanced CKD4.     Assessment/ Plan:   1) AKI on CKD 4: Baseline CKD likely arterionephrosclerosis with diabetic kidney disease and possibly some contribution of HRS given cirrhosis although less likely.    UA does show some protein but not extensive.  CKD has been progressive also with recent AKI during hospitalization for osteomyelitis.  Baseline creatinine around 3.6-4 at this time.   Now with AKI likely secondary to ATN associated with COVID.  Creatinine continues to slowly worsen with levels >7.  Patient  hesitant to start dialysis but finally agreed to it stating you don't know till you try. HD #1 on 1/4  Appreciate VIR tunneled catheter for initiation of dialysis; tolerated HD #2 Thur.   Seen on HD #3 today  and then will resume TTS regimen. 3K bath 1.5L net UF but HR incr so will decr goal to net even to 1L depending on response.  Appreciate Dr. Carlis Abbott [facility] seeing the patient in preparation for permanent access in rt arm and placing a BBF so promptly. Beautiful thrill.  CLIP; renal navigator aware.  -Continue to monitor daily Cr, Dose meds for GFR -Monitor Daily I/Os, Daily weight  -Maintain MAP>65 for optimal renal perfusion.  -Avoid nephrotoxic medications including NSAIDs.   2) Acute hypoxic respiratory failure secondary to COVID-pneumonia: overall improved.  Continue management per primary team.     3) Anemia: Likely multifactorial with acute illness, chronic inflammation, CKD contributing.  Continue transfusion per primary team. Will check iron stores and consider ESA. May not respond to ESA with the inflammatory state - S/P  transfusion; GI believes it's more c/w hemorrhoidal bleeding and no scope indicated. - With all the blood products she's likely iron replete   4)  Metabolic acidosis: Mixed anion gap and non-anion gap likely secondary to acute kidney injury.  D/c'd   sodium bicarb   5) UTI: Pyuria on urinalysis.  Minimal symptoms were because of AKI we will treat.  Ceftriaxone 1 g x 5 days.     6) Atrial fibrillation: tachycardic now on HD w/ UF. Management per primary  7) Left foot wound: Status post amputation, wound open.  Management per primary  8) Diabetes Mellitus Type 2 with Hyperglycemia: Management per primary  9) Renal osteodystrophy - phos 3.6 1/6      Subjective:   Denies  shortness of breath and cough; appetite slowly improving.  Denies further  blood from rectum. Denies f/c.   Objective:   BP 136/84    Pulse 93    Temp (!) 97 F (36.1 C) (Temporal)    Resp 17    Ht 5\' 7"  (1.702 m)    Wt 85.6 kg    SpO2 95%    BMI 29.56 kg/m   Intake/Output Summary (Last 24 hours) at 01/18/2021 0941 Last data filed at 01/18/2021 1610 Gross per 24 hour  Intake 237 ml  Output 225 ml  Net 12 ml   Weight change: 3.4 kg  Physical Exam: Constitutional: Chronically ill-appearing, lying in bed, no distress ENMT: ears and nose without scars or lesions, dry mucous membranes CV: tachy  Respiratory: Bilateral chest rise with no increased work of breathing Gastrointestinal: soft, non-tender, no palpable masses or hernias Skin: no visible lesions or rashes Psych: alert, judgement/insight appropriate, appropriate mood and affect Ext: tr  edema b/l Access: RIJ TC, rt BBF good thrill  Imaging: No results found.  Labs: BMET Recent Labs  Lab 01/12/21 0610 01/13/21 0056 01/14/21 0036 01/15/21 0125 01/16/21 0420 01/17/21 0805 01/18/21 0357  NA 138 139 140 140 138 137 137  K 3.8 4.1 4.2 4.5 4.1 3.5 3.3*  CL 108 104 105 106 104 100 104  CO2 19* 19* 20* 19* 22 26 25   GLUCOSE 117* 121* 168* 159* 107* 127* 170*  BUN 96* 102* 106* 109* 65* 39* 43*  CREATININE 6.98* 7.03* 7.11* 7.09* 4.82* 3.59* 4.04*  CALCIUM 8.4* 8.4* 8.5* 8.6* 8.2* 8.2* 8.2*   PHOS 4.9* 5.3* 5.4*  --  5.0* 3.6  --    CBC Recent Labs  Lab 01/12/21 0610 01/12/21 1646 01/13/21 0056 01/13/21 1131 01/15/21 0125 01/16/21 0420 01/17/21 0805 01/18/21 0357  WBC 4.4   < > 11.7*   < > 5.9 8.2 8.4 9.0  NEUTROABS 3.5  --  10.2*  --   --   --   --   --   HGB 7.4*   < > 8.3*   8.3*   < > 7.6* 7.6* 7.7* 7.6*  HCT 23.6*   < > 25.4*   25.7*   < > 23.4* 23.1* 23.8* 24.2*  MCV 89.7   < > 86.7   < > 90.3 89.9 91.2 92.0  PLT PLATELET CLUMPS NOTED ON SMEAR, UNABLE TO ESTIMATE   < > 115*   < > 80* 74* PLATELET CLUMPS NOTED ON SMEAR, UNABLE TO ESTIMATE 62*   < > = values in this interval not displayed.    Medications:     (feeding supplement) PROSource Plus  30 mL Oral BID BM   atorvastatin  40 mg Oral Daily   chlorhexidine  15 mL Mouth/Throat Once   Or   mouth rinse  15 mL Mouth Rinse Once   Chlorhexidine Gluconate Cloth  6 each Topical Daily   collagenase   Topical Daily   diltiazem  180 mg Oral Daily   feeding supplement  1 Container Oral TID BM   heparin  5,000 Units Subcutaneous Q8H   hydrocortisone  25 mg Rectal BID   insulin aspart  0-15 Units Subcutaneous TID WC   insulin aspart  0-5 Units Subcutaneous QHS   levothyroxine  100 mcg Oral Q0600   metoprolol tartrate  50 mg Oral BID   multivitamin  1 tablet Oral QHS   pantoprazole  40 mg Oral BID   senna-docusate  1 tablet Oral Daily      Otelia Santee, MD 01/18/2021, 9:41 AM

## 2021-01-19 LAB — BASIC METABOLIC PANEL
Anion gap: 12 (ref 5–15)
BUN: 28 mg/dL — ABNORMAL HIGH (ref 8–23)
CO2: 28 mmol/L (ref 22–32)
Calcium: 8 mg/dL — ABNORMAL LOW (ref 8.9–10.3)
Chloride: 100 mmol/L (ref 98–111)
Creatinine, Ser: 3.08 mg/dL — ABNORMAL HIGH (ref 0.44–1.00)
GFR, Estimated: 16 mL/min — ABNORMAL LOW (ref >60)
Glucose, Bld: 140 mg/dL — ABNORMAL HIGH (ref 70–99)
Potassium: 3.1 mmol/L — ABNORMAL LOW (ref 3.5–5.1)
Sodium: 140 mmol/L (ref 135–145)

## 2021-01-19 LAB — GLUCOSE, CAPILLARY
Glucose-Capillary: 122 mg/dL — ABNORMAL HIGH (ref 70–99)
Glucose-Capillary: 154 mg/dL — ABNORMAL HIGH (ref 70–99)
Glucose-Capillary: 97 mg/dL (ref 70–99)
Glucose-Capillary: 105 mg/dL — ABNORMAL HIGH (ref 70–99)

## 2021-01-19 LAB — CBC
HCT: 23.8 % — ABNORMAL LOW (ref 36.0–46.0)
Hemoglobin: 7.8 g/dL — ABNORMAL LOW (ref 12.0–15.0)
MCH: 30.1 pg (ref 26.0–34.0)
MCHC: 32.8 g/dL (ref 30.0–36.0)
MCV: 91.9 fL (ref 80.0–100.0)
Platelets: 61 10*3/uL — ABNORMAL LOW (ref 150–400)
RBC: 2.59 MIL/uL — ABNORMAL LOW (ref 3.87–5.11)
RDW: 19.6 % — ABNORMAL HIGH (ref 11.5–15.5)
WBC: 8.8 10*3/uL (ref 4.0–10.5)
nRBC: 0 % (ref 0.0–0.2)

## 2021-01-19 MED ORDER — SENNOSIDES-DOCUSATE SODIUM 8.6-50 MG PO TABS
2.0000 | ORAL_TABLET | Freq: Two times a day (BID) | ORAL | Status: DC
  Administered 2021-01-19 – 2021-01-24 (×5): 2 via ORAL
  Filled 2021-01-19 (×6): qty 2

## 2021-01-19 MED ORDER — APIXABAN 5 MG PO TABS
5.0000 mg | ORAL_TABLET | Freq: Two times a day (BID) | ORAL | Status: DC
  Administered 2021-01-19 – 2021-01-24 (×10): 5 mg via ORAL
  Filled 2021-01-19 (×10): qty 1

## 2021-01-19 MED ORDER — POLYETHYLENE GLYCOL 3350 17 G PO PACK
17.0000 g | PACK | Freq: Every day | ORAL | Status: DC
  Administered 2021-01-19 – 2021-01-24 (×3): 17 g via ORAL
  Filled 2021-01-19 (×3): qty 1

## 2021-01-19 NOTE — Plan of Care (Signed)
  Problem: Nutrition: Goal: Adequate nutrition will be maintained Outcome: Progressing   

## 2021-01-19 NOTE — Progress Notes (Signed)
NAME:  Autumn [name], MRN:  [id number], DOB:  [birth year], LOS: 50 ADMISSION DATE:  01/09/2021  Subjective  Patient evaluated at bedside this AM. Reports she is tired but otherwise doing well. Mentions she has not had BM in over one week. No further episodes of bleeding. Does not have good appetite right now.  Objective   Blood pressure 139/79, pulse 88, temperature 97.9 F (36.6 C), temperature source Oral, resp. rate 16, height 5\' 7"  (1.702 m), weight 85.2 kg, SpO2 92 %.     Intake/Output Summary (Last 24 hours) at 01/19/2021 1537 Last data filed at 01/19/2021 0557 Gross per 24 hour  Intake 297 ml  Output 0 ml  Net 297 ml   Filed Weights   01/18/21 0814 01/18/21 1146 01/19/21 0000  Weight: 85.6 kg 84.6 kg 85.2 kg   Physical Exam: General: Resting comfortably in bed, no acute distress CV: Regular rate, rhythm. No murmurs appreciated. Pulm: Normal work of breathing on room air. Abdomen: Soft, non-tender, non-distended. Normoactive bowel sounds. Skin: Palpable thrill over fistula in R arm. No further bleeding appreciated.  Labs    CBC Latest Ref Rng & Units 01/19/2021 01/18/2021 01/17/2021  WBC 4.0 - 10.5 K/uL 8.8 9.0 8.4  Hemoglobin 12.0 - 15.0 g/dL 7.8(L) 7.6(L) 7.7(L)  Hematocrit 36.0 - 46.0 % 23.8(L) 24.2(L) 23.8(L)  Platelets 150 - 400 K/uL 61(L) 62(L) PLATELET CLUMPS NOTED ON SMEAR, UNABLE TO ESTIMATE   BMP Latest Ref Rng & Units 01/19/2021 01/18/2021 01/17/2021  Glucose 70 - 99 mg/dL 140(H) 170(H) 127(H)  BUN 8 - 23 mg/dL 28(H) 43(H) 39(H)  Creatinine 0.44 - 1.00 mg/dL 3.08(H) 4.04(H) 3.59(H)  Sodium 135 - 145 mmol/L 140 137 137  Potassium 3.5 - 5.1 mmol/L 3.1(L) 3.3(L) 3.5  Chloride 98 - 111 mmol/L 100 104 100  CO2 22 - 32 mmol/L 28 25 26   Calcium 8.9 - 10.3 mg/dL 8.0(L) 8.2(L) 8.2(L)   Consults:  Nephrology VSS  Micro Data:  BCx 12/29 > NGTD COVID-19 12/29 > Positive  Summary  Autumn [name] is [phone] person with 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 Hospital course complicated by [id number] infection and GI bleed 2/2 uremia and anticoagulation.  Assessment & Plan:  Principal Problem:   COVID-19 Active Problems:   Atrial fibrillation with RVR (HCC)   Acute kidney injury superimposed on CKD (HCC)   Liver cirrhosis secondary to NASH (nonalcoholic steatohepatitis) (HCC)   Anemia of chronic disease   Type II diabetes mellitus (HCC)   GI bleeding   Protein-calorie malnutrition, severe  #Acute on chronic renal insufficiency Patient completed HD session yesterday and tolerated well. Per nephrology will move forward with plans for Tuesday, Thursday, Saturday HD schedule. Appreciate nephrology's assistance.  - Continue TThS HD schedule per nephrology - Strict I/O - Daily weights - Will require SNF that can take HD patients  #Acute on chronic anemia Patient previously had GI bleed and bleeding from port sites likely 2/2 uremia in setting of heparin infusion. She has not had anymore episodes of bleeding in 4-5 days. H/H continue to be stable. We will continue with suppositories for hemorrhoidal bleeding. In addition, we will initiate full anticoagulation given her history of CVA.  - Hydrocortisone suppositories twice daily - Re-start home apixaban 5mg  twice daily - Pantoprazole 40mg  twice daily  #NASH cirrhosis #Thrombocytopenia Appears  to be well-compensated, no evidence of ascites or encephalopathy. No active bleeding as well. Platelets stable in low 60's. Will need to continue to monitor as we initiate full anticoagulation for atrial fibrillation today. - Daily CBC  #Protein-calorie malnutrition #Deconditioning Patient has significant medical history with multiple chronic illnesses. She will require short-term  rehabilitation to build up strength. In addition, RD has added supplementation. Patient does report decreased appetite, can consider adding mirtazapine. She has been isolated in her room for 10 days now without family members available. - Continue PT/OT - Continue nutrition supplementation per RD - Consider adding appetite stimulant  #Constipation Patient reports not having bowel movements since she last had GI bleed last week. We will increase her bowel regimen today, watch for anymore signs of bleeding. - Senokot-S twice daily, Miralax 17g daily  #COVID-19 infection Patient completed 6d of decadron, has been on room air since this time. Today we will d/c airborne and contact precautions as she is past the 10d isolation period. - D/C airborne and contact precautions  Best practice:  DIET: CM IVF: n/a DVT PPX: Eliquis BOWEL: Senokot, Miralax CODE: FULL FAM COM: n/a  Sanjuan Dame, MD Internal Medicine Resident PGY-1 PAGER: [phone] 01/19/2021 3:37 PM  If after hours (below), please contact on-call pager: [phone] 5PM-7AM Monday-Friday 1PM-7AM Saturday-Sunday

## 2021-01-19 NOTE — Progress Notes (Signed)
Patient continues to refuse [place] heparin, pt states that it made her bleed to much the last time she was on it. Patient educated , patient continues to refuse.Will continue to educate.

## 2021-01-19 NOTE — TOC Progression Note (Signed)
Transition of Care [facility]) - Progression Note    Patient Details  Name: Autumn [name] MRN: [id number] Date of Birth: 06/13/1950  [facility] CM/SW [facility], LCSW Phone Number:[phone] 01/19/2021, 10:46 AM  Clinical Narrative:     CSW spoke with pt via phone to receive her bed choice. Pt explained that she has never been to a SNF before therefore it was hard for her to make a decision. Pt explained that she wanted to be close to her home. CSW let her know that she would see which one would be closest and call her back.  CSW attempted to call back pt and was unable to reach her.  TOC team will continue to assist with discharge planning needs.  Expected Discharge Plan: [place] Barriers to Discharge: Continued Medical Work up  Expected Discharge Plan and Services Expected Discharge Plan: [place] In-house Referral: Clinical Social Work   Post Acute Care Choice: [place] Living arrangements for the past 2 months: [place]                                       Social Determinants of Health (SDOH) Interventions    Readmission Risk Interventions No flowsheet data found.

## 2021-01-19 NOTE — Progress Notes (Signed)
[facility] Progress Note   71 y.o. female with a past medical history significant for DM2, HTN, hypothyroidism, NASH cirrhosis, pAfib, CVA, admitted for COVID pneumonia c/b AKI on top of advanced CKD4.     Assessment/ Plan:   1) AKI on CKD 4: Baseline CKD likely arterionephrosclerosis with diabetic kidney disease and possibly some contribution of HRS given cirrhosis although less likely.    UA does show some protein but not extensive.  CKD has been progressive also with recent AKI during hospitalization for osteomyelitis.  Baseline creatinine around 3.6-4 at this time.   Now with AKI likely secondary to ATN associated with COVID.  Creatinine continues to slowly worsen with levels >7.  Patient  hesitant to start dialysis but finally agreed to it stating you don't know till you try. HD #1 on 1/4  Appreciate VIR tunneled catheter for initiation of dialysis; tolerated HD #3 Sat.   Will place her on TTS regimen.  Appreciate Dr. Carlis Abbott VVS seeing the patient and placing a BBF so promptly, beautiful thrill.  CLIP; renal navigator aware. I am not convinced she will do well on dialysis but after explaining to her she stated that you don't know till you try.  If she does not do well on dialysis or improvement of QOL in a few mths will need to entertain a transition away for RRT.  -Continue to monitor daily Cr, Dose meds for GFR -Monitor Daily I/Os, Daily weight  -Maintain MAP>65 for optimal renal perfusion.  -Avoid nephrotoxic medications including NSAIDs.   2) Acute hypoxic respiratory failure secondary to COVID-pneumonia: overall improved.  Continue management per primary team.     3) Anemia: Likely multifactorial with acute illness, chronic inflammation, CKD contributing.  Continue transfusion per primary team. Will check iron stores and consider ESA. May not respond to ESA with the inflammatory state - S/P  transfusion; GI believes it's more c/w hemorrhoidal bleeding and no scope  indicated. - With all the blood products she's likely iron replete   4) Metabolic acidosis: Mixed anion gap and non-anion gap likely secondary to acute kidney injury.  D/c'd   sodium bicarb   5) UTI: Pyuria on urinalysis.  Minimal symptoms were because of AKI we will treat.  Ceftriaxone 1 g x 5 days.     6) Atrial fibrillation: tachycardic now on HD w/ UF. Management per primary  7) Left foot wound: Status post amputation, wound open.  Management per primary  8) Diabetes Mellitus Type 2 with Hyperglycemia: Management per primary  9) Renal osteodystrophy - phos 3.6 1/6      Subjective:   Denies  shortness of breath and cough; appetite still not great today.  Denies further  blood from rectum. Denies f/c.   Objective:   BP (!) 157/77 (BP Location: Left Arm)    Pulse 83    Temp 97.6 F (36.4 C) (Oral)    Resp 15    Ht 5\' 7"  (1.702 m)    Wt 85.2 kg    SpO2 95%    BMI 29.42 kg/m   Intake/Output Summary (Last 24 hours) at 01/19/2021 1055 Last data filed at 01/19/2021 0557 Gross per 24 hour  Intake 297 ml  Output 1000 ml  Net -703 ml   Weight change: -4.9 kg  Physical Exam: Constitutional: Chronically ill-appearing, lying in bed, no distress ENMT: ears and nose without scars or lesions, dry mucous membranes CV: tachy  Respiratory: Bilateral chest rise with no increased work of breathing Gastrointestinal: soft,  non-tender, no palpable masses or hernias Skin: no visible lesions or rashes Psych: alert, judgement/insight appropriate, appropriate mood and affect Ext: tr edema b/l Access: RIJ TC, rt BBF good thrill  Imaging: No results found.  Labs: BMET Recent Labs  Lab 01/13/21 0056 01/14/21 0036 01/15/21 0125 01/16/21 0420 01/17/21 0805 01/18/21 0357 01/19/21 0423  NA 139 140 140 138 137 137 140  K 4.1 4.2 4.5 4.1 3.5 3.3* 3.1*  CL 104 105 106 104 100 104 100  CO2 19* 20* 19* 22 26 25 28   GLUCOSE 121* 168* 159* 107* 127* 170* 140*  BUN 102* 106* 109* 65* 39* 43* 28*   CREATININE 7.03* 7.11* 7.09* 4.82* 3.59* 4.04* 3.08*  CALCIUM 8.4* 8.5* 8.6* 8.2* 8.2* 8.2* 8.0*  PHOS 5.3* 5.4*  --  5.0* 3.6  --   --    CBC Recent Labs  Lab 01/13/21 0056 01/13/21 1131 01/16/21 0420 01/17/21 0805 01/18/21 0357 01/19/21 0423  WBC 11.7*   < > 8.2 8.4 9.0 8.8  NEUTROABS 10.2*  --   --   --   --   --   HGB 8.3*   8.3*   < > 7.6* 7.7* 7.6* 7.8*  HCT 25.4*   25.7*   < > 23.1* 23.8* 24.2* 23.8*  MCV 86.7   < > 89.9 91.2 92.0 91.9  PLT 115*   < > 74* PLATELET CLUMPS NOTED ON SMEAR, UNABLE TO ESTIMATE 62* 61*   < > = values in this interval not displayed.    Medications:     (feeding supplement) PROSource Plus  30 mL Oral BID BM   atorvastatin  40 mg Oral Daily   chlorhexidine  15 mL Mouth/Throat Once   Or   mouth rinse  15 mL Mouth Rinse Once   Chlorhexidine Gluconate Cloth  6 each Topical Daily   collagenase   Topical Daily   diltiazem  180 mg Oral Daily   feeding supplement  1 Container Oral TID BM   heparin  5,000 Units Subcutaneous Q8H   hydrocortisone  25 mg Rectal BID   insulin aspart  0-15 Units Subcutaneous TID WC   insulin aspart  0-5 Units Subcutaneous QHS   levothyroxine  100 mcg Oral Q0600   metoprolol tartrate  50 mg Oral BID   multivitamin  1 tablet Oral QHS   pantoprazole  40 mg Oral BID   senna-docusate  1 tablet Oral Daily      Otelia Santee, MD 01/19/2021, 10:55 AM

## 2021-01-20 DIAGNOSIS — U071 COVID-19: Secondary | ICD-10-CM | POA: Diagnosis not present

## 2021-01-20 LAB — BASIC METABOLIC PANEL
Anion gap: 12 (ref 5–15)
BUN: 31 mg/dL — ABNORMAL HIGH (ref 8–23)
CO2: 26 mmol/L (ref 22–32)
Calcium: 8.3 mg/dL — ABNORMAL LOW (ref 8.9–10.3)
Chloride: 99 mmol/L (ref 98–111)
Creatinine, Ser: 3.77 mg/dL — ABNORMAL HIGH (ref 0.44–1.00)
GFR, Estimated: 12 mL/min — ABNORMAL LOW (ref >60)
Glucose, Bld: 112 mg/dL — ABNORMAL HIGH (ref 70–99)
Potassium: 3.2 mmol/L — ABNORMAL LOW (ref 3.5–5.1)
Sodium: 137 mmol/L (ref 135–145)

## 2021-01-20 LAB — CBC
HCT: 25.1 % — ABNORMAL LOW (ref 36.0–46.0)
Hemoglobin: 8.2 g/dL — ABNORMAL LOW (ref 12.0–15.0)
MCH: 29.6 pg (ref 26.0–34.0)
MCHC: 32.7 g/dL (ref 30.0–36.0)
MCV: 90.6 fL (ref 80.0–100.0)
Platelets: 80 10*3/uL — ABNORMAL LOW (ref 150–400)
RBC: 2.77 MIL/uL — ABNORMAL LOW (ref 3.87–5.11)
RDW: 19.8 % — ABNORMAL HIGH (ref 11.5–15.5)
WBC: 9 10*3/uL (ref 4.0–10.5)
nRBC: 0 % (ref 0.0–0.2)

## 2021-01-20 LAB — GLUCOSE, CAPILLARY
Glucose-Capillary: 110 mg/dL — ABNORMAL HIGH (ref 70–99)
Glucose-Capillary: 176 mg/dL — ABNORMAL HIGH (ref 70–99)
Glucose-Capillary: 98 mg/dL (ref 70–99)
Glucose-Capillary: 139 mg/dL — ABNORMAL HIGH (ref 70–99)

## 2021-01-20 LAB — HEPARIN INDUCED PLATELET AB (HIT ANTIBODY): Heparin Induced Plt Ab: 0.065 OD (ref 0.000–0.400)

## 2021-01-20 MED ORDER — TUBERCULIN PPD 5 UNIT/0.1ML ID SOLN
5.0000 [IU] | Freq: Once | INTRADERMAL | Status: AC
  Administered 2021-01-20: 5 [IU] via INTRADERMAL
  Filled 2021-01-20 (×2): qty 0.1

## 2021-01-20 NOTE — Care Management Important Message (Signed)
Important Message  Patient Details  Name: Autumn [name] MRN: [id number] Date of Birth: May 20, 1950   Medicare Important Message Given:  Yes     [name] 01/20/2021, 9:11 AM

## 2021-01-20 NOTE — Progress Notes (Signed)
[facility] Progress Note   71 y.o. female with a past medical history significant for DM2, HTN, hypothyroidism, NASH cirrhosis, pAfib, CVA, admitted for COVID pneumonia c/b AKI on top of advanced CKD4.     Assessment/ Plan:   1) AKI on CKD 4: Baseline CKD likely arterionephrosclerosis with diabetic kidney disease and possibly some contribution of HRS given cirrhosis although less likely.    UA does show some protein but not extensive.  CKD has been progressive also with recent AKI during hospitalization for osteomyelitis.  Baseline creatinine around 3.6-4 at this time.   Now with AKI likely secondary to ATN associated with COVID.  Creatinine continues to slowly worsen with levels >7.  Patient  hesitant to start dialysis but finally agreed to it stating you don't know till you try. HD #1 on 1/4  Appreciate VIR tunneled catheter for initiation of dialysis; tolerated HD #3 Sat 01/18/21   Will place her on TTS regimen- next HD 01/21/21  Appreciate Dr. Carlis Abbott VVS seeing the patient and placing access  CLIP; renal navigator aware.   -Continue to monitor daily Cr, Dose meds for GFR -Monitor Daily I/Os, Daily weight  -Maintain MAP>65 for optimal renal perfusion.  -Avoid nephrotoxic medications including NSAIDs.   2) Acute hypoxic respiratory failure secondary to COVID-pneumonia: overall improved.  Continue management per primary team.     3) Anemia: Likely multifactorial with acute illness, chronic inflammation, CKD contributing.  Continue transfusion per primary team. Will check iron stores and consider ESA. May not respond to ESA with the inflammatory state - S/P  transfusion; GI believes it's more c/w hemorrhoidal bleeding and no scope indicated. - With all the blood products she's likely iron replete   4) Metabolic acidosis: Mixed anion gap and non-anion gap likely secondary to acute kidney injury.  D/c'd   sodium bicarb   5) UTI: Pyuria on urinalysis.  Minimal symptoms were  because of AKI we will treat.  Ceftriaxone 1 g x 5 days.     6) Atrial fibrillation: tachycardic now on HD w/ UF. Management per primary  7) Left foot wound: Status post amputation, wound open.  Management per primary  8) Diabetes Mellitus Type 2 with Hyperglycemia: Management per primary  9) Renal osteodystrophy - phos 3.6 1/6    Subjective:    Feeling OK today.  Expresses desire to get better and to go to rehab   Objective:   BP (!) 154/89 (BP Location: Left Arm)    Pulse 89    Temp 98.3 F (36.8 C) (Oral)    Resp 16    Ht 5\' 7"  (1.702 m)    Wt 85.6 kg    SpO2 95%    BMI 29.56 kg/m   Intake/Output Summary (Last 24 hours) at 01/20/2021 1041 Last data filed at 01/20/2021 0115 Gross per 24 hour  Intake 0 ml  Output 250 ml  Net -250 ml   Weight change: 0 kg  Physical Exam: Constitutional: NAD, sitting in bed CV: tachy  Respiratory: clear bilaterally Gastrointestinal: soft, nontender Skin: no visible lesions or rashes Ext: trace LE edema, bilateral UE edema Access: RIJ TC, rt BBF good thrill  Imaging: No results found.  Labs: BMET Recent Labs  Lab 01/14/21 0036 01/15/21 0125 01/16/21 0420 01/17/21 0805 01/18/21 0357 01/19/21 0423 01/20/21 0655  NA 140 140 138 137 137 140 137  K 4.2 4.5 4.1 3.5 3.3* 3.1* 3.2*  CL 105 106 104 100 104 100 99  CO2 20* 19* 22  26 25 28 26   GLUCOSE 168* 159* 107* 127* 170* 140* 112*  BUN 106* 109* 65* 39* 43* 28* 31*  CREATININE 7.11* 7.09* 4.82* 3.59* 4.04* 3.08* 3.77*  CALCIUM 8.5* 8.6* 8.2* 8.2* 8.2* 8.0* 8.3*  PHOS 5.4*  --  5.0* 3.6  --   --   --    CBC Recent Labs  Lab 01/17/21 0805 01/18/21 0357 01/19/21 0423 01/20/21 0655  WBC 8.4 9.0 8.8 9.0  HGB 7.7* 7.6* 7.8* 8.2*  HCT 23.8* 24.2* 23.8* 25.1*  MCV 91.2 92.0 91.9 90.6  PLT PLATELET CLUMPS NOTED ON SMEAR, UNABLE TO ESTIMATE 62* 61* 80*    Medications:     (feeding supplement) PROSource Plus  30 mL Oral BID BM   apixaban  5 mg Oral BID   atorvastatin  40 mg  Oral Daily   chlorhexidine  15 mL Mouth/Throat Once   Or   mouth rinse  15 mL Mouth Rinse Once   Chlorhexidine Gluconate Cloth  6 each Topical Daily   collagenase   Topical Daily   diltiazem  180 mg Oral Daily   feeding supplement  1 Container Oral TID BM   hydrocortisone  25 mg Rectal BID   insulin aspart  0-15 Units Subcutaneous TID WC   insulin aspart  0-5 Units Subcutaneous QHS   levothyroxine  100 mcg Oral Q0600   metoprolol tartrate  50 mg Oral BID   multivitamin  1 tablet Oral QHS   pantoprazole  40 mg Oral BID   polyethylene glycol  17 g Oral Daily   senna-docusate  2 tablet Oral BID     Madelon Lips MD 01/20/2021, 10:41 AM

## 2021-01-20 NOTE — Progress Notes (Signed)
Contacted by CSW this am regarding pt's d/c plan. Pt will be going to [facility]f at d/c. SNF is requesting [facility] at d/c. SNF reports pt will need a MWF after 8 appointment. Referral made to [facility] admissions this am. Will follow and assist.    Melven Sartorius Renal Navigator [phone]

## 2021-01-20 NOTE — Progress Notes (Signed)
Mobility Specialist Progress Note:   01/20/21 1400  Mobility  Activity Transferred:  Bed to chair  Level of Assistance Total care  Assistive Device Stedy  Mobility Response Tolerated fair  Mobility performed by Nurse;Nurse tech;Mobility specialist  $Mobility charge 1 Mobility   MaxA +3 to stand in stedy. Left in chair.   [facility] Public librarian Phone [phone] Secondary Phone [phone]

## 2021-01-20 NOTE — Progress Notes (Signed)
HD#11 SUBJECTIVE:  Patient Summary: Autumn [name] is a 71 y.o. with a pertinent PMH of CKD IV 2/2 nephrotic syndrome, cirrhosis 2/2 NASH, HFpEF, paroxysmal Afib on Eliquis, HTN, T2DM, hypothyroidism, gout, previous CVA who presented wit3h dyspnea and admitted for acute on chronic kidney disease complicated by uremia, volume overload, COVID, and GI bleeding  Overnight Events: No acute events overnight.  Interim History: This is hospital day 28 for Autumn [name] who was seen and evaluated with her friends at the bedside. She feels much better today. She has had no further episodes of bleeding. Has no acute complaints and she notes that she is eager to get to rehab.    OBJECTIVE:  Vital Signs: Vitals:   01/19/21 1219 01/19/21 2000 01/20/21 0000 01/20/21 0400  BP: 139/79 (!) 141/80 (!) 159/73 (!) 144/95  Pulse: 88 95 95 95  Resp: 16 20 17 19   Temp: 97.9 F (36.6 C) 98 F (36.7 C) 98.5 F (36.9 C) 98.7 F (37.1 C)  TempSrc: Oral Oral Oral Oral  SpO2: 92% 99% 100% 100%  Weight:    85.6 kg  Height:       Supplemental O2: Room Air SpO2: 100 %   Filed Weights   01/18/21 1146 01/19/21 0000 01/20/21 0400  Weight: 84.6 kg 85.2 kg 85.6 kg     Intake/Output Summary (Last 24 hours) at 01/20/2021 0611 Last data filed at 01/20/2021 0115 Gross per 24 hour  Intake 0 ml  Output 250 ml  Net -250 ml   Net IO Since Admission: -1,352.36 mL [01/20/21 0611]  Physical Exam: General: Pleasant, chronically ill appearing female laying in bed. No acute distress. CV: Regular rate, irregular rhythm. No murmurs.  Pulmonary: Lungs CTAB. Normal effort on room air.  Abdominal: Soft, nontender, nondistended. Extremities: Palpable radial pulses. Palpable thrill over R AVF. Skin: Warm and dry. No obvious rash or lesions. Neuro: Awake, alert. Able to converse appropriately.  Psych: Normal mood and affect    ASSESSMENT/PLAN:  Assessment: Principal Problem:   COVID-19 Active Problems:    Atrial fibrillation with RVR (HCC)   Acute kidney injury superimposed on CKD (HCC)   Liver cirrhosis secondary to NASH (nonalcoholic steatohepatitis) (HCC)   Anemia of chronic disease   Type II diabetes mellitus (HCC)   GI bleeding   Protein-calorie malnutrition, severe   Plan: #AKI on CKD IV Patient has been tolerating dialysis well and will be moving forward with the TTS HD schedule per nephro, although may need this changed to MWF per SNF. Appreciate nephro and renal navigator's assistance with coordinating outpatient HD. Of note, patient remains oliguric with 250 cc urine output in the last 24 hrs.  - Plan for HD tomorrow - Strict I&Os and daily weights  - SNF placement pending  #Acute on chronic anemia #Thrombocytopenia  The patient previously was having bleeding likely from uremia in the setting of heparin infusion. Bleeding has since resolved and Hb remains stable at 8.2 and platelets at 80. - Continue protonix 40 mg bid - Hydrocortisone suppositories bid - Resumed anticoag with eliquis 5 mg bid  #NASH cirrhosis Patient appears to be well compensated, with no signs of ascites or hepatic encephalopathy.    #Protein-calorie malnutrition #Deconditioning Severe malnutrition likely 2/2 to multiple comorbidities. Appreciate RD's recommendations.   #Constipation  Patient had a bowel movement last night, after not having one for approximately 1 week. No melena or hematochezia noted.  - Senokot- S bid - Miralax 17 g daily   #COVID-19  Patient is s/p 6 days of decadron and remains on room air. She is now out of the 10 d isolation period and contact/airborne precuations were discontinued.  Best Practice: Diet: Renal diet IVF: Fluids: none VTE: Eliquis Code: Full AB: none Therapy Recs: SNF Family Contact: Juanda Bond, to be notified. DISPO: Anticipated discharge  in 1-3 days  to Skilled nursing facility pending Medical stability and placement .  Signature: Buddy Duty,  D.O.  Internal Medicine Resident, PGY-1 [facility]  Pager: [phone] 6:11 AM, 01/20/2021   Please contact the on call pager after 5 pm and on weekends at [phone].

## 2021-01-20 NOTE — TOC Progression Note (Signed)
Transition of Care [facility]) - Progression Note    Patient Details  Name: Autumn [name] MRN: [id number] Date of Birth: 07-Sep-1950  Transition of Care [facility]) CM/SW [place], [place] Phone Number: 01/20/2021, 2:22 PM  Clinical Narrative:     Received call from [facility] and is informed that [facility]) is requesting either a TB Skin test or Quantiferon Gold TB test. CSW notified DO.   Expected Discharge Plan: [place] Barriers to Discharge: Continued Medical Work up  Expected Discharge Plan and Services Expected Discharge Plan: [place] In-house Referral: Clinical Social Work   Post Acute Care Choice: [place] Living arrangements for the past 2 months: [place]                                       Social Determinants of Health (SDOH) Interventions    Readmission Risk Interventions No flowsheet data found.

## 2021-01-20 NOTE — TOC Progression Note (Signed)
Transition of Care [facility]) - Progression Note    Patient Details  Name: Autumn [name] MRN: [id number] Date of Birth: January 03, 1951  Transition of Care [facility]) CM/SW [place], [place] Phone Number: 01/20/2021, 12:12 PM  Clinical Narrative:      CSW met with pt to discuss SNF choice. Informed her that [facility] SNF would be able to accept her if HD could be arranged at [facility] on MWF between 8am-5pm. Pt chooses this facility as it is the closes to her home. CSW notified renal navigator who will work on arranging OPHD. Pt requested that CSW update her friend [name]; CSW called and left message. TOC will continue to follow.   Expected Discharge Plan: [place] Barriers to Discharge: Continued Medical Work up  Expected Discharge Plan and Services Expected Discharge Plan: [place] In-house Referral: Clinical Social Work   Post Acute Care Choice: [place] Living arrangements for the past 2 months: [place]                                       Social Determinants of Health (SDOH) Interventions    Readmission Risk Interventions No flowsheet data found.

## 2021-01-20 NOTE — Progress Notes (Signed)
Mobility Specialist Progress Note:   01/20/21 1634  Mobility  Activity Transferred:  Chair to bed  Level of Assistance Total care  Assistive Device Stedy  Mobility Ambulated with assistance in room  Mobility Response Tolerated fair  Mobility performed by Mobility specialist;Nurse tech;Nurse  $Mobility charge 1 Mobility   MaxA +3 to stand in stedy and tranfer back to bed.   [facility] Public librarian Phone [phone] Secondary Phone [phone]

## 2021-01-21 ENCOUNTER — Ambulatory Visit: Payer: Medicare (Managed Care) | Admitting: Podiatry

## 2021-01-21 ENCOUNTER — Inpatient Hospital Stay (HOSPITAL_COMMUNITY): Payer: Medicare (Managed Care)

## 2021-01-21 DIAGNOSIS — U071 COVID-19: Secondary | ICD-10-CM | POA: Diagnosis not present

## 2021-01-21 LAB — GLUCOSE, CAPILLARY
Glucose-Capillary: 111 mg/dL — ABNORMAL HIGH (ref 70–99)
Glucose-Capillary: 105 mg/dL — ABNORMAL HIGH (ref 70–99)
Glucose-Capillary: 114 mg/dL — ABNORMAL HIGH (ref 70–99)
Glucose-Capillary: 141 mg/dL — ABNORMAL HIGH (ref 70–99)

## 2021-01-21 LAB — RENAL FUNCTION PANEL
Albumin: 2.2 g/dL — ABNORMAL LOW (ref 3.5–5.0)
Anion gap: 9 (ref 5–15)
BUN: 39 mg/dL — ABNORMAL HIGH (ref 8–23)
CO2: 23 mmol/L (ref 22–32)
Calcium: 7.7 mg/dL — ABNORMAL LOW (ref 8.9–10.3)
Chloride: 103 mmol/L (ref 98–111)
Creatinine, Ser: 4.21 mg/dL — ABNORMAL HIGH (ref 0.44–1.00)
GFR, Estimated: 11 mL/min — ABNORMAL LOW (ref >60)
Glucose, Bld: 170 mg/dL — ABNORMAL HIGH (ref 70–99)
Phosphorus: 3 mg/dL (ref 2.5–4.6)
Potassium: 2.8 mmol/L — ABNORMAL LOW (ref 3.5–5.1)
Sodium: 135 mmol/L (ref 135–145)

## 2021-01-21 LAB — CBC
HCT: 22 % — ABNORMAL LOW (ref 36.0–46.0)
Hemoglobin: 7.3 g/dL — ABNORMAL LOW (ref 12.0–15.0)
MCH: 29.2 pg (ref 26.0–34.0)
MCHC: 33.2 g/dL (ref 30.0–36.0)
MCV: 88 fL (ref 80.0–100.0)
Platelets: 90 10*3/uL — ABNORMAL LOW (ref 150–400)
RBC: 2.5 MIL/uL — ABNORMAL LOW (ref 3.87–5.11)
RDW: 19.9 % — ABNORMAL HIGH (ref 11.5–15.5)
WBC: 9.7 10*3/uL (ref 4.0–10.5)
nRBC: 0 % (ref 0.0–0.2)

## 2021-01-21 MED ORDER — HEPARIN SODIUM (PORCINE) 1000 UNIT/ML IJ SOLN
INTRAMUSCULAR | Status: AC
  Filled 2021-01-21: qty 4

## 2021-01-21 NOTE — Progress Notes (Addendum)
Received call from Raynelle Highland at [facility]. Pt must have a TB skin test, TB gold quantiferon lab test, or chest x-ray with very specific documentation (documentation provided to MD) in order to start at out-pt clinic. Pt has been approved for clinic on a MWF schedule. Pt will be on second shift and clinic will coordinate specific arrival time with snf at d/c. Will provide needed documentation to clinic once it is available.   Melven Sartorius Renal Navigator [phone]  Addendum at 4:04 pm: Faxed chest x-ray to out-pt HD clinic. RN reviewed and states documentation is acceptable. Pt should be able to start at clinic on Friday.

## 2021-01-21 NOTE — Plan of Care (Signed)
  Problem: Coping: Goal: Level of anxiety will decrease Outcome: Progressing   Problem: Pain Managment: Goal: General experience of comfort will improve Outcome: Progressing   Problem: Safety: Goal: Ability to remain free from injury will improve Outcome: Progressing   

## 2021-01-21 NOTE — Progress Notes (Signed)
OT Cancellation Note  Patient Details Name: JONNELL HENTGES MRN: [id number] DOB: [birth year]   Cancelled Treatment:    Reason Eval/Treat Not Completed: Patient at procedure or test/ unavailable   Joeseph Amor OTR/L  Acute Rehab Services  [phone] office number [phone] pager number  Joeseph Amor 01/21/2021, 9:39 AM

## 2021-01-21 NOTE — Progress Notes (Signed)
Chaplain Melvenia Beam received a referral from [employer], who received a secure email message requesting Spiritual Care Assessment. Requests states Patient would like someone to share daily devotionals with her during her hospital stay. Patient is currently in isolation, so I follow-up with nurse.

## 2021-01-21 NOTE — Progress Notes (Signed)
° °  HD#12 SUBJECTIVE:  Patient Summary: Autumn [name] is a 71 y.o. 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  Overnight Events: No acute events overnight  Interim History: This is hospital day 12 for Autumn [name] who was seen and evaluated at dialysis this morning. She states that she feels tired this morning, but she felt wonderful because her friends visited her yesterday.   OBJECTIVE:  Vital Signs: Vitals:   01/20/21 1604 01/20/21 1951 01/21/21 0006 01/21/21 0353  BP: (!) 144/78 127/82 133/86 (!) 143/67  Pulse: 100 97 86 82  Resp: 20 20 20 19   Temp: 98.6 F (37 C) 99 F (37.2 C) 97.9 F (36.6 C) 98.9 F (37.2 C)  TempSrc: Oral Oral Oral Oral  SpO2: 97% 96% 97% 99%  Weight:   83.3 kg   Height:       Supplemental O2: Room Air SpO2: 99 % O2 Flow Rate (L/min): 2 L/min FiO2 (%): 40 %  Filed Weights   01/19/21 0000 01/20/21 0400 01/21/21 0006  Weight: 85.2 kg 85.6 kg 83.3 kg     Intake/Output Summary (Last 24 hours) at 01/21/2021 0601 Last data filed at 01/21/2021 0355 Gross per 24 hour  Intake 237 ml  Output 100 ml  Net 137 ml   Net IO Since Admission: -1,215.36 mL [01/21/21 0601]  Physical Exam: General: Chronically ill appearing female laying in bed. No acute distress CV: Regular rate, irregular rhythm. No murmurs.  Pulmonary: Lungs CTAB. Normal effort.  Extremities: Palpable thrill R AVF.  Skin: Warm and dry.  Neuro: Awake and alert, although appears lethargic. Able to converse and follow commands.  Psych: Normal mood and affect      ASSESSMENT/PLAN:  Assessment: Principal Problem:   COVID-19 Active Problems:   Atrial fibrillation with RVR (HCC)   Acute kidney injury superimposed on CKD  (HCC)   Liver cirrhosis secondary to NASH (nonalcoholic steatohepatitis) (HCC)   Anemia of chronic disease   Type II diabetes mellitus (HCC)   GI bleeding   Protein-calorie malnutrition, severe   Plan: #AKI on CKD IV Patient will need to be transitioned to MWF HD schedule per SNF. Appreciate nephro and renal navigator's assistance with coordinating outpatient HD for her. Patient continues to make urine, with 100 cc urine output in the last 24 hrs.  - Will discharge to [facility] with [facility] for HD - Continue strict I&Os and daily weights  #Acute on chronic anemia #Thrombocytopenia Hb dropped from 8.2 yesterday, to 7.3 today. No episodes of bleeding reported. Platelets remain stable at 90. - Daily CBC - Continue anticoagulation for Afib  #Protein-calorie malnutrition #Deconditioning Severe malnutrition 2/2 to multiple comorbidities. Will continue Boost Breeze supplements tid per RD recommendations.    Best Practice: Diet: Renal diet IVF: Fluids: none VTE: Eliquis Code: Full AB: none Therapy Recs: SNF Family Contact: [name] (friend), to be notified. DISPO: Anticipated discharge  in 1-2 days  to Skilled nursing facility pending  placement .  Signature: Buddy Duty, D.O.  Internal Medicine Resident, PGY-1 [facility]  Pager: [phone] 6:01 AM, 01/21/2021   Please contact the on call pager after 5 pm and on weekends at [phone].

## 2021-01-21 NOTE — Progress Notes (Signed)
Physical Therapy Treatment Patient Details Name: Autumn [name] MRN: [id number] DOB: 1951/01/05 Today's Date: 01/21/2021   History of Present Illness Pt is a 70 y/o female admitted 12/29 c SOB.  +COVID, with pneumonia,  acute on chronic renal failure, AKI, UTI.  GIB 1/1. Nursing cleared today for PT eval.  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  Follow along with pt to get her up to balance and transfer to a chair as her tolerance allows, and continue to work on strengthening with focus of her attention to R side as able.  Position with WB on R side, with instructions for movement of R side and to safety with all balance skills.  Increase OOB to chair time as tolerated.   Recommendations for follow up therapy are one component of a multi-disciplinary discharge planning process, led by the attending physician.  Recommendations may be updated based on patient status, additional functional criteria and insurance authorization.  Follow Up Recommendations  Skilled nursing-short term rehab (<3 hours/day)     Assistance Recommended at Discharge Frequent or constant Supervision/Assistance  Patient can return home with the following Two people to help with walking and/or transfers;Two people to help with bathing/dressing/bathroom;Assistance with feeding;Assistance with cooking/housework;Direct supervision/assist for medications management;Direct supervision/assist for financial management;Assist for transportation;Help with stairs or ramp for entrance   Equipment Recommendations  None recommended by PT    Recommendations for Other Services       Precautions / Restrictions Precautions Precautions: Fall Precaution Comments: post  op shoe LLE, wound vac is off Restrictions Weight Bearing Restrictions: Yes LLE Weight Bearing: Weight bearing as tolerated Other Position/Activity Restrictions: post op shoe on LLE     Mobility  Bed Mobility Overal bed mobility: Needs Assistance             General bed mobility comments: pt declines to assist with mobility on bed    Transfers                   General transfer comment: declined    Ambulation/Gait                   Stairs             Wheelchair Mobility    Modified Rankin (Stroke Patients Only) Modified Rankin (Stroke Patients Only) Pre-Morbid Rankin Score: No symptoms Modified Rankin: Moderately severe disability     Balance                                            Cognition Arousal/Alertness: Awake/alert Behavior During Therapy: WFL for tasks assessed/performed;Anxious Overall Cognitive Status: Impaired/Different from baseline Area of Impairment: Following commands;Memory;Orientation;Attention                 Orientation Level: Place;Time;Situation Current Attention Level: Selective Memory: Decreased short-term memory Following Commands: Follows one step commands with increased time Safety/Judgement: Decreased awareness of safety;Decreased awareness of deficits Awareness: Intellectual Problem Solving: Slow processing;Requires verbal cues;Requires tactile cues;Decreased initiation General Comments: very poor follow through on RLE, requires both tactile and verbal  cues for managing ROM with R side inattention        Exercises General Exercises - Lower Extremity Ankle Circles/Pumps: AAROM;5 reps Quad Sets: AROM;10 reps Heel Slides: AROM;AAROM;10 reps Hip ABduction/ADduction: AROM;AAROM;10 reps Straight Leg Raises: AAROM;10 reps Hip Flexion/Marching: AAROM;10 reps    General Comments General comments (skin integrity, edema, etc.): pt was assisted to reposition on the bed with limited  follow through on RLE, weak and struggling to replicate her requested movements      Pertinent Vitals/Pain Pain Assessment: No/denies pain    Home Living                          Prior Function            PT Goals (current goals can now be found in the care plan section) Acute Rehab PT Goals Patient Stated Goal: to get better    Frequency    Min 2X/week      PT Plan Current plan remains appropriate    Co-evaluation              AM-PAC PT "6 Clicks" Mobility   Outcome Measure  Help needed turning from your back to your side while in a flat bed without using bedrails?: A Lot Help needed moving from lying on your back to sitting on the side of a flat bed without using bedrails?: Total Help needed moving to and from a bed to a chair (including a wheelchair)?: Total Help needed standing up from a chair using your arms (e.g., wheelchair or bedside chair)?: Total Help needed to walk in hospital room?: Total Help needed climbing 3-5 steps with a railing? : Total 6 Click Score: 7    End of Session   Activity Tolerance: Patient limited by fatigue;Treatment limited secondary to medical complications (Comment) Patient left: in bed;with call bell/phone within reach;with bed alarm set Nurse Communication: Mobility status PT Visit Diagnosis: Muscle weakness (generalized) (M62.81);Difficulty in walking, not elsewhere classified (R26.2);Other abnormalities of gait and mobility (R26.89);Unsteadiness on feet (R26.81) Hemiplegia - Right/Left: Right Hemiplegia - dominant/non-dominant: Dominant Hemiplegia - caused by: Cerebral infarction     Time: 3762-8315 PT Time Calculation (min) (ACUTE ONLY): 23 min  Charges:  $Therapeutic Exercise: 8-22 mins $Therapeutic Activity: 8-22 mins             Ramond Dial 01/21/2021, 2:12 PM  Mee Hives, PT PhD Acute Rehab Dept. Number: [place] [facility] [place]

## 2021-01-21 NOTE — TOC Progression Note (Addendum)
Transition of Care [facility]) - Progression Note    Patient Details  Name: Autumn [name] MRN: [id number] Date of Birth: [birth year]  Transition of Care [facility]) CM/SW [place], [place] Phone Number: 01/21/2021, 11:00 AM  Clinical Narrative:     Confirmed with SNF that they will start insurance auth; likely to take 1-2 days. Pt can DC to SNF once auth received and OPHD is finalized.   1115: Received call from [facility]; notified they need a new PT note in order to start auth. CSW notified acute PT to see pt after HD today if possible.   1450: CSW notified [place] SNF that PT note is available. They will start auth.   Expected Discharge Plan: [place] Barriers to Discharge: Continued Medical Work up  Expected Discharge Plan and Services Expected Discharge Plan: [place] In-house Referral: Clinical Social Work   Post Acute Care Choice: [place] Living arrangements for the past 2 months: [place]                                       Social Determinants of Health (SDOH) Interventions    Readmission Risk Interventions No flowsheet data found.

## 2021-01-21 NOTE — Plan of Care (Signed)
°  Problem: Clinical Measurements: Goal: Cardiovascular complication will be avoided Outcome: Progressing   Problem: Coping: Goal: Level of anxiety will decrease Outcome: Progressing   Problem: Elimination: Goal: Will not experience complications related to urinary retention Outcome: Progressing   Problem: Pain Managment: Goal: General experience of comfort will improve Outcome: Progressing   Problem: Safety: Goal: Ability to remain free from injury will improve Outcome: Progressing

## 2021-01-21 NOTE — Progress Notes (Signed)
Chaplain Albertina Parr attempted Spiritual Care Assessment of Patient. Patient is presently sleeping. Advised Nurse that I was there, bu did not want to awaken her. Chaplain will follow-up. Left business card on table.

## 2021-01-21 NOTE — Progress Notes (Signed)
PT Cancellation Note  Patient Details Name: Autumn [name] MRN: [id number] DOB: [birth year]   Cancelled Treatment:    Reason Eval/Treat Not Completed: Patient at procedure or test/unavailable.  In HD, not able to see this AM.  Retry as time and pt allow.   Ramond Dial 01/21/2021, 9:37 AM  Mee Hives, PT PhD Acute Rehab Dept. Number: [place] and [place]

## 2021-01-21 NOTE — Progress Notes (Signed)
[facility] Progress Note   71 y.o. female with a past medical history significant for DM2, HTN, hypothyroidism, NASH cirrhosis, pAfib, CVA, admitted for COVID pneumonia c/b AKI on top of advanced CKD4.     Assessment/ Plan:   1) AKI on CKD 4: Baseline CKD likely arterionephrosclerosis with diabetic kidney disease and possibly some contribution of HRS given cirrhosis although less likely.    UA does show some protein but not extensive.  CKD has been progressive also with recent AKI during hospitalization for osteomyelitis.  Baseline creatinine around 3.6-4 at this time.   Now with AKI likely secondary to ATN associated with COVID.  Creatinine continues to slowly worsen with levels >7.  HD #1 on 1/4  Appreciate VIR tunneled catheter for initiation of dialysis; tolerated HD #3 Sat 01/18/21   Will place her on TTS regimen- next HD 01/21/21 today  Appreciate Dr. Carlis Abbott VVS seeing the patient and placing access  CLIP; renal navigator aware.   -Continue to monitor daily Cr, Dose meds for GFR -Monitor Daily I/Os, Daily weight  -Maintain MAP>65 for optimal renal perfusion.  -Avoid nephrotoxic medications including NSAIDs.   2) Acute hypoxic respiratory failure secondary to COVID-pneumonia: overall improved.  Continue management per primary team.     3) Anemia: Likely multifactorial with acute illness, chronic inflammation, CKD contributing.  Continue transfusion per primary team.  - S/P  transfusion; GI believes it's more c/w hemorrhoidal bleeding and no scope indicated.   5) UTI: Pyuria on urinalysis.  Minimal symptoms were because of AKI we will treat.  Ceftriaxone 1 g x 5 days.     6) Atrial fibrillation: tachycardic now on HD w/ UF. Management per primary  7) Left foot wound: Status post amputation, wound open.  Management per primary  8) Diabetes Mellitus Type 2 with Hyperglycemia: Management per primary  9) Renal osteodystrophy - phos 3.6 1/6    Subjective:     Having loose stools today   Objective:   BP 122/78 (BP Location: Left Arm)    Pulse (!) 105    Temp 97.6 F (36.4 C) (Oral)    Resp 18    Ht 5\' 7"  (1.702 m)    Wt 82.3 kg    SpO2 97%    BMI 28.42 kg/m   Intake/Output Summary (Last 24 hours) at 01/21/2021 1420 Last data filed at 01/21/2021 1115 Gross per 24 hour  Intake 237 ml  Output 1602 ml  Net -1365 ml   Weight change: -2.3 kg  Physical Exam: Constitutional: NAD, sitting in bed CV: tachy  Respiratory: clear bilaterally Gastrointestinal: soft, nontender Skin: no visible lesions or rashes Ext: trace LE edema, bilateral UE edema Access: RIJ TC, rt BBF good thrill  Imaging: DG CHEST PORT 1 VIEW  Addendum Date: 01/21/2021   ADDENDUM REPORT: 01/21/2021 13:54 ADDENDUM: There are no cavitary lesions in the lung fields. As far as seen, there is no definite evidence of significant mediastinal or hilar lymphadenopathy. There are no imaging signs of pulmonary tuberculosis. Electronically Signed   By: Elmer Picker M.D.   On: 01/21/2021 13:54   Result Date: 01/21/2021 CLINICAL DATA:  Shortness of breath EXAM: PORTABLE CHEST 1 VIEW COMPARISON:  01/13/2021 FINDINGS: Transverse diameter of heart is increased. Central pulmonary vessels are prominent. Increased interstitial markings are seen in the parahilar regions and lower lung fields. There is blunting of right lateral CP angle. There is no new focal pulmonary consolidation. There is no pneumothorax. There is interval placement of large  caliber central venous catheter through the right jugular vein with its tip in the superior vena cava close to the right atrium. IMPRESSION: Cardiomegaly. There is prominence of central pulmonary vessels and increased interstitial markings in both lungs suggesting CHF. Possible small right pleural effusion. Electronically Signed: By: Elmer Picker M.D. On: 01/21/2021 13:10    Labs: BMET Recent Labs  Lab 01/15/21 0125 01/16/21 0420  01/17/21 0805 01/18/21 0357 01/19/21 0423 01/20/21 0655 01/21/21 0251  NA 140 138 137 137 140 137 135  K 4.5 4.1 3.5 3.3* 3.1* 3.2* 2.8*  CL 106 104 100 104 100 99 103  CO2 19* 22 26 25 28 26 23   GLUCOSE 159* 107* 127* 170* 140* 112* 170*  BUN 109* 65* 39* 43* 28* 31* 39*  CREATININE 7.09* 4.82* 3.59* 4.04* 3.08* 3.77* 4.21*  CALCIUM 8.6* 8.2* 8.2* 8.2* 8.0* 8.3* 7.7*  PHOS  --  5.0* 3.6  --   --   --  3.0   CBC Recent Labs  Lab 01/18/21 0357 01/19/21 0423 01/20/21 0655 01/21/21 0251  WBC 9.0 8.8 9.0 9.7  HGB 7.6* 7.8* 8.2* 7.3*  HCT 24.2* 23.8* 25.1* 22.0*  MCV 92.0 91.9 90.6 88.0  PLT 62* 61* 80* 90*    Medications:     (feeding supplement) PROSource Plus  30 mL Oral BID BM   apixaban  5 mg Oral BID   atorvastatin  40 mg Oral Daily   chlorhexidine  15 mL Mouth/Throat Once   Or   mouth rinse  15 mL Mouth Rinse Once   Chlorhexidine Gluconate Cloth  6 each Topical Daily   collagenase   Topical Daily   diltiazem  180 mg Oral Daily   feeding supplement  1 Container Oral TID BM   heparin sodium (porcine)       hydrocortisone  25 mg Rectal BID   insulin aspart  0-15 Units Subcutaneous TID WC   insulin aspart  0-5 Units Subcutaneous QHS   levothyroxine  100 mcg Oral Q0600   metoprolol tartrate  50 mg Oral BID   multivitamin  1 tablet Oral QHS   pantoprazole  40 mg Oral BID   polyethylene glycol  17 g Oral Daily   senna-docusate  2 tablet Oral BID   tuberculin  5 Units Intradermal Once     Madelon Lips MD 01/21/2021, 2:20 PM

## 2021-01-22 ENCOUNTER — Inpatient Hospital Stay (HOSPITAL_COMMUNITY): Payer: Medicare (Managed Care)

## 2021-01-22 DIAGNOSIS — U071 COVID-19: Secondary | ICD-10-CM | POA: Diagnosis not present

## 2021-01-22 LAB — LACTIC ACID, PLASMA
Lactic Acid, Venous: 1 mmol/L (ref 0.5–1.9)
Lactic Acid, Venous: 1.1 mmol/L (ref 0.5–1.9)

## 2021-01-22 LAB — CBC
HCT: 23.1 % — ABNORMAL LOW (ref 36.0–46.0)
Hemoglobin: 7.2 g/dL — ABNORMAL LOW (ref 12.0–15.0)
MCH: 28.8 pg (ref 26.0–34.0)
MCHC: 31.2 g/dL (ref 30.0–36.0)
MCV: 92.4 fL (ref 80.0–100.0)
Platelets: 87 10*3/uL — ABNORMAL LOW (ref 150–400)
RBC: 2.5 MIL/uL — ABNORMAL LOW (ref 3.87–5.11)
RDW: 20.1 % — ABNORMAL HIGH (ref 11.5–15.5)
WBC: 7.6 10*3/uL (ref 4.0–10.5)
nRBC: 0 % (ref 0.0–0.2)

## 2021-01-22 LAB — BASIC METABOLIC PANEL
Anion gap: 11 (ref 5–15)
BUN: 19 mg/dL (ref 8–23)
CO2: 26 mmol/L (ref 22–32)
Calcium: 7.8 mg/dL — ABNORMAL LOW (ref 8.9–10.3)
Chloride: 102 mmol/L (ref 98–111)
Creatinine, Ser: 2.84 mg/dL — ABNORMAL HIGH (ref 0.44–1.00)
GFR, Estimated: 18 mL/min — ABNORMAL LOW (ref >60)
Glucose, Bld: 109 mg/dL — ABNORMAL HIGH (ref 70–99)
Potassium: 3 mmol/L — ABNORMAL LOW (ref 3.5–5.1)
Sodium: 139 mmol/L (ref 135–145)

## 2021-01-22 LAB — GLUCOSE, CAPILLARY
Glucose-Capillary: 104 mg/dL — ABNORMAL HIGH (ref 70–99)
Glucose-Capillary: 96 mg/dL (ref 70–99)
Glucose-Capillary: 126 mg/dL — ABNORMAL HIGH (ref 70–99)
Glucose-Capillary: 120 mg/dL — ABNORMAL HIGH (ref 70–99)

## 2021-01-22 LAB — BLOOD GAS, ARTERIAL
Acid-Base Excess: 3.7 mmol/L — ABNORMAL HIGH (ref 0.0–2.0)
Bicarbonate: 27 mmol/L (ref 20.0–28.0)
FIO2: 21
O2 Saturation: 95.1 %
Patient temperature: 37
pCO2 arterial: 35.8 mmHg (ref 32.0–48.0)
pH, Arterial: 7.489 — ABNORMAL HIGH (ref 7.350–7.450)
pO2, Arterial: 67 mmHg — ABNORMAL LOW (ref 83.0–108.0)

## 2021-01-22 LAB — TB SKIN TEST
Induration: 0 mm
TB Skin Test: NEGATIVE

## 2021-01-22 MED ORDER — POTASSIUM CHLORIDE 20 MEQ PO PACK
20.0000 meq | PACK | Freq: Once | ORAL | Status: AC
  Administered 2021-01-22: 20 meq via ORAL
  Filled 2021-01-22: qty 1

## 2021-01-22 NOTE — Progress Notes (Signed)
[facility] Progress Note   71 y.o. female with a past medical history significant for DM2, HTN, hypothyroidism, NASH cirrhosis, pAfib, CVA, admitted for COVID pneumonia c/b AKI on top of advanced CKD4.     Assessment/ Plan:   1) AKI on CKD 4: Baseline CKD likely arterionephrosclerosis with diabetic kidney disease and possibly some contribution of HRS given cirrhosis although less likely.    UA does show some protein but not extensive.  CKD has been progressive also with recent AKI during hospitalization for osteomyelitis.  Baseline creatinine around 3.6-4 at this time.   Now with AKI likely secondary to ATN associated with COVID.  Creatinine continues to slowly worsen with levels >7.  HD #1 on 1/4  Appreciate VIR tunneled catheter for initiation of dialysis; tolerated HD #3 Sat 01/18/21   Will place her on TTS regimen- next HD 01/23/10 for short run, can d/c afterwards  Appreciate Dr. Carlis Abbott VVS seeing the patient and placing access  CLIP to [facility] MWF   -Continue to monitor daily Cr, Dose meds for GFR -Monitor Daily I/Os, Daily weight  -Maintain MAP>65 for optimal renal perfusion.  -Avoid nephrotoxic medications including NSAIDs.   2) Acute hypoxic respiratory failure secondary to COVID-pneumonia: overall improved.  Continue management per primary team.     3) Anemia: Likely multifactorial with acute illness, chronic inflammation, CKD contributing.  Continue transfusion per primary team.  - S/P  transfusion; GI believes it's more c/w hemorrhoidal bleeding and no scope indicated.   5) UTI: Pyuria on urinalysis.  Minimal symptoms were because of AKI we will treat.  Ceftriaxone 1 g x 5 days.     6) Atrial fibrillation: tachycardic now on HD w/ UF. Management per primary  7) Left foot wound: Status post amputation, wound open.  Management per primary  8) Diabetes Mellitus Type 2 with Hyperglycemia: Management per primary  9) Renal osteodystrophy - phos 3.6  1/6    Subjective:    HD yesterday, did well. For short run of HD tomorrow and then can start new HD schedule Friday.  Had RRT last night for acute encephalopathy.  Put on CPAP. CT head neg.     Objective:   BP 132/81 (BP Location: Left Arm)    Pulse 79    Temp 97.7 F (36.5 C) (Oral)    Resp 20    Ht 5\' 7"  (1.702 m)    Wt 83.4 kg    SpO2 99%    BMI 28.80 kg/m   Intake/Output Summary (Last 24 hours) at 01/22/2021 1330 Last data filed at 01/22/2021 1240 Gross per 24 hour  Intake 290 ml  Output 50 ml  Net 240 ml   Weight change: 0.5 kg  Physical Exam: Constitutional: NAD, sitting in bed CV: tachy  Respiratory: clear bilaterally Gastrointestinal: soft, nontender Skin: no visible lesions or rashes Ext: trace LE edema, bilateral UE edema Access: RIJ TC, rt BBF good thrill  Imaging: CT HEAD WO CONTRAST (5MM)  Result Date: 01/22/2021 CLINICAL DATA:  Mental status change. EXAM: CT HEAD WITHOUT CONTRAST TECHNIQUE: Contiguous axial images were obtained from the base of the skull through the vertex without intravenous contrast. RADIATION DOSE REDUCTION: This exam was performed according to the departmental dose-optimization program which includes automated exposure control, adjustment of the mA and/or kV according to patient size and/or use of iterative reconstruction technique. COMPARISON:  CT head 11/21/2020. MRI 11/13/2020. FINDINGS: Brain: There is no evidence of acute intracranial hemorrhage, mass lesion, brain edema or  extra-axial fluid collection. Stable atrophy with prominence of the ventricles and subarachnoid spaces. Stable subcortical infarct in the posterior left frontal region. There is no CT evidence of acute cortical infarction. Chronic small vessel ischemic changes in the periventricular white matter appear unchanged. Vascular: Intracranial vascular calcifications. No hyperdense vessel identified. Skull: Negative for fracture or focal lesion. Sinuses/Orbits: The visualized  paranasal sinuses and mastoid air cells are clear. No orbital abnormalities are seen. Other: None. IMPRESSION: 1. Stable CT of the appearance with atrophy, chronic small vessel ischemic changes and an old infarct in the posterior left frontal lobe. 2. No acute intracranial findings. Electronically Signed   By: Richardean Sale M.D.   On: 01/22/2021 09:35   DG CHEST PORT 1 VIEW  Addendum Date: 01/21/2021   ADDENDUM REPORT: 01/21/2021 13:54 ADDENDUM: There are no cavitary lesions in the lung fields. As far as seen, there is no definite evidence of significant mediastinal or hilar lymphadenopathy. There are no imaging signs of pulmonary tuberculosis. Electronically Signed   By: Elmer Picker M.D.   On: 01/21/2021 13:54   Result Date: 01/21/2021 CLINICAL DATA:  Shortness of breath EXAM: PORTABLE CHEST 1 VIEW COMPARISON:  01/13/2021 FINDINGS: Transverse diameter of heart is increased. Central pulmonary vessels are prominent. Increased interstitial markings are seen in the parahilar regions and lower lung fields. There is blunting of right lateral CP angle. There is no new focal pulmonary consolidation. There is no pneumothorax. There is interval placement of large caliber central venous catheter through the right jugular vein with its tip in the superior vena cava close to the right atrium. IMPRESSION: Cardiomegaly. There is prominence of central pulmonary vessels and increased interstitial markings in both lungs suggesting CHF. Possible small right pleural effusion. Electronically Signed: By: Elmer Picker M.D. On: 01/21/2021 13:10    Labs: BMET Recent Labs  Lab 01/16/21 0420 01/17/21 0805 01/18/21 0357 01/19/21 0423 01/20/21 0655 01/21/21 0251 01/22/21 0407  NA 138 137 137 140 137 135 139  K 4.1 3.5 3.3* 3.1* 3.2* 2.8* 3.0*  CL 104 100 104 100 99 103 102  CO2 22 26 25 28 26 23 26   GLUCOSE 107* 127* 170* 140* 112* 170* 109*  BUN 65* 39* 43* 28* 31* 39* 19  CREATININE 4.82* 3.59* 4.04*  3.08* 3.77* 4.21* 2.84*  CALCIUM 8.2* 8.2* 8.2* 8.0* 8.3* 7.7* 7.8*  PHOS 5.0* 3.6  --   --   --  3.0  --    CBC Recent Labs  Lab 01/19/21 0423 01/20/21 0655 01/21/21 0251 01/22/21 0407  WBC 8.8 9.0 9.7 7.6  HGB 7.8* 8.2* 7.3* 7.2*  HCT 23.8* 25.1* 22.0* 23.1*  MCV 91.9 90.6 88.0 92.4  PLT 61* 80* 90* 87*    Medications:     (feeding supplement) PROSource Plus  30 mL Oral BID BM   apixaban  5 mg Oral BID   atorvastatin  40 mg Oral Daily   chlorhexidine  15 mL Mouth/Throat Once   Or   mouth rinse  15 mL Mouth Rinse Once   Chlorhexidine Gluconate Cloth  6 each Topical Daily   collagenase   Topical Daily   diltiazem  180 mg Oral Daily   feeding supplement  1 Container Oral TID BM   hydrocortisone  25 mg Rectal BID   insulin aspart  0-15 Units Subcutaneous TID WC   insulin aspart  0-5 Units Subcutaneous QHS   levothyroxine  100 mcg Oral Q0600   metoprolol tartrate  50 mg Oral BID  multivitamin  1 tablet Oral QHS   pantoprazole  40 mg Oral BID   polyethylene glycol  17 g Oral Daily   senna-docusate  2 tablet Oral BID   tuberculin  5 Units Intradermal Once     Madelon Lips MD 01/22/2021, 1:30 PM

## 2021-01-22 NOTE — Progress Notes (Signed)
Pt has been accepted/approved for out-pt HD at [facility]. Pt can start at clinic on Friday. Pt will have a MWF schedule. Pt will need to arrive at 10:30 for first appt to complete paperwork prior to 11:00 chair time. Spoke to clinic manager this am to make sure clinic aware pt tested positive for covid on 12/29. No special arrangements needed when pt starts at out-pt clinic due to recent covid diagnosis. Nephrologist and CSW made aware of above arrangements. Will assist as needed.   Melven Sartorius Renal Navigator [phone]

## 2021-01-22 NOTE — Progress Notes (Signed)
Notified by nursing staff of pt c/o CP and SOB. Provider at bedside. Pt arousable to noxious stimuli and purposeful. Pt denies CP and says she has a little SOB. Pt placed on CPAP at previous settings. ABG obtained.

## 2021-01-22 NOTE — TOC Progression Note (Signed)
Transition of Care [facility]) - Progression Note    Patient Details  Name: MARYBELL ROBARDS MRN: [id number] Date of Birth: 14-Feb-1950  Transition of Care [facility] CM/SW Contact  Reece Agar, [place] Phone Number: 01/22/2021, 4:13 PM  Clinical Narrative:    Carollee Leitz at [facility] contacted CSW for update on pt DC, CSW shared that pt will DC on Friday and has been approved for HD with a MWF schedule at [facility] with an arrival of 10:30 for 11:00 chair.  CSW contacted pt medical team to ask about pt discharging tomorrow instead of Friday (per SNF). Medical team confirmed that they were planning to DC pt tomorrow. CSW will continue following for DC planning needs.   Expected Discharge Plan: [place] Barriers to Discharge: Continued Medical Work up  Expected Discharge Plan and Services Expected Discharge Plan: [place] In-house Referral: Clinical Social Work   Post Acute Care Choice: [place] Living arrangements for the past 2 months: [place]                                       Social Determinants of Health (SDOH) Interventions    Readmission Risk Interventions No flowsheet data found.

## 2021-01-22 NOTE — Progress Notes (Addendum)
HD#13 SUBJECTIVE:  Patient Summary: Autumn [name] is a 71 y.o. 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  Overnight Events: Patient reportedly more lethargic and somnolent overnight, arousable to noxious stimuli. ABG performed with no acidosis noted and no hypercarbia. Patient was placed on CPAP.    Interim History: This is hospital day 72 for Autumn [name] who was seen and evaluated at the bedside this morning. She is pretty tired on evaluation, although she does wake and open her eyes to voice. She is able to follow commands at this time. Was initially on CPAP, but took this off and she was saturating at 99% on room air.   OBJECTIVE:  Vital Signs: Vitals:   01/22/21 0007 01/22/21 0417 01/22/21 0830 01/22/21 1134  BP: 128/82 (!) 148/77 132/71 132/81  Pulse: 83 91 92 79  Resp: 17 20 20 20   Temp: 98.3 F (36.8 C) 97.8 F (36.6 C) 97.7 F (36.5 C) 97.7 F (36.5 C)  TempSrc: Oral Oral Oral Oral  SpO2: 96% 97% 99% 99%  Weight: 83.4 kg     Height:       Supplemental O2: Room Air SpO2: 99 % O2 Flow Rate (L/min): 2 L/min FiO2 (%): 40 %  Filed Weights   01/21/21 0730 01/21/21 1115 01/22/21 0007  Weight: 83.8 kg 82.3 kg 83.4 kg     Intake/Output Summary (Last 24 hours) at 01/22/2021 1353 Last data filed at 01/22/2021 1240 Gross per 24 hour  Intake 290 ml  Output 50 ml  Net 240 ml   Net IO Since Admission: -2,477.36 mL [01/22/21 1353]  Physical Exam: General: Chronically ill appearing female laying in bed. No acute distress, but is lethargic. CV: Regular rate, irregular rhythm. No murmurs. 1+ LE edema Pulmonary: Lungs CTAB. Normal effort.  Extremities: R AVF with thrill Skin: Warm and dry. Significant ecchymosis   Neuro: Able to wake to voice, appears lethargic. Able to follow commands.     ASSESSMENT/PLAN:  Assessment: Principal Problem:   COVID-19 Active Problems:   Atrial fibrillation with RVR (HCC)   Acute kidney injury superimposed on CKD (HCC)   Liver cirrhosis secondary to NASH (nonalcoholic steatohepatitis) (HCC)   Anemia of chronic disease   Type II diabetes mellitus (HCC)   GI bleeding   Protein-calorie malnutrition, severe   Plan: #AKI on CKD IV, progressing to ESRD on HD Will plan for one more short HD session tomorrow prior to discharge, as the patient will be transitioning to MWF HD schedule. Tolerating dialysis well so far and patient remains oliguric. CXR with no evidence of TB, thus patient is able to begin outpatient HD.  - Will discharge to [facility] with [facility] for HD - Continue strict I&Os and daily weights  #Lethargy and altered mental status Patient reportedly more lethargic overnight and was difficult to arouse. ABG with no acidosis or hypercarbia noted. Upon our evaluation this morning, she is difficult to arouse and will not keep her eyes open for a long period of time, thus obtained CT head. CT head with no acute intracranial abnormalities. Lactic acid is reassuring. We obtained blood cultures due to recent history of infection, looked at wound on her foot which did not show signs of purulence or cellulitis. This  could be hospital delirium with waxing and waning course. Recommend trial of out of bed activity later in the day, no sedating medications.  #Acute on chronic anemia #Thrombocytopenia Hb 7.2 this morning. No episodes of bleeding reported. Platelets 87. - Daily CBC - Continue eliquis 5 mg bid  #Protein-calorie malnutrition #Deconditioning Severe malnutrition 2/2 to multiple comorbidities. Will continue Boost Breeze supplements tid per RD recommendations.   Best Practice: Diet: Renal diet IVF: Fluids: none VTE:  Eliquis Code: Full AB: none Therapy Recs: SNF DISPO: Anticipated discharge in 1-2 days to Skilled nursing facility pending Medical stability and placement .  Signature: Buddy Duty, D.O.  Internal Medicine Resident, PGY-1 [facility]  Pager: [phone] 1:53 PM, 01/22/2021   Please contact the on call pager after 5 pm and on weekends at [phone].

## 2021-01-22 NOTE — Progress Notes (Signed)
Patient states she is having chest pain and difficulty breathing.  Patient limited interactions and still appearing lethargic.  VSS. CBG 104. MD and Rapid RN notified.  Patient placed on CPAP and ABG obtained by RT.

## 2021-01-22 NOTE — Progress Notes (Addendum)
Interval progress note  Paged by RN at 539 AM that pt complaining of chest pain and difficulty breathing. EKG obtained, showing no acute changes. Lab reviewed; stable anemia and no new electrolyte abnormalities.   Patient evaluated at bedside. RN reports that she has been less interactive tonight, compared to her baseline, which RN attributed to be 2/2 receiving HD earlier today. Pt did not de-saturate, was placed on non re-breather because of CP and also called rapid RN.   Vitals stable. Pt is lethargic appearing and very minimally interactive to noxious stimuli. STAT ABG obtained, with no complaints by patient. On exam, she has reproducible left sided chest pain. LCTAB with no signs of fluid overload. CPAP was at bedside, and RN reports that she has not worn it since arriving to the floor from ED. No formal sleep eval in chart, but has been advised to wear CPAP for suspected sleep apnea (many risk factors), but appears that she does not follow.   A/P Suspect acute encephalopathy 2/2 hypercarbia. Pt put on CPAP and awaiting ABG. Will continue to monitor for improvement and return to baseline.    Lajean Manes, MD  Internal Medicine Resident, PGY-1 [facility]  Pager: [phone]

## 2021-01-22 NOTE — Progress Notes (Signed)
Patient appears lethargic. Patient is able to answer all questions but reamins with eyes closed and limited interaction. VSS. CBG 141.  MD made aware.

## 2021-01-22 NOTE — Progress Notes (Signed)
Patient's TB skin test negative.

## 2021-01-23 DIAGNOSIS — N184 Chronic kidney disease, stage 4 (severe): Secondary | ICD-10-CM | POA: Diagnosis not present

## 2021-01-23 DIAGNOSIS — D696 Thrombocytopenia, unspecified: Secondary | ICD-10-CM

## 2021-01-23 DIAGNOSIS — D649 Anemia, unspecified: Secondary | ICD-10-CM | POA: Diagnosis not present

## 2021-01-23 DIAGNOSIS — U071 COVID-19: Secondary | ICD-10-CM | POA: Diagnosis not present

## 2021-01-23 DIAGNOSIS — N179 Acute kidney failure, unspecified: Secondary | ICD-10-CM | POA: Diagnosis not present

## 2021-01-23 LAB — CBC
HCT: 21.7 % — ABNORMAL LOW (ref 36.0–46.0)
Hemoglobin: 6.8 g/dL — CL (ref 12.0–15.0)
MCH: 28.8 pg (ref 26.0–34.0)
MCHC: 31.3 g/dL (ref 30.0–36.0)
MCV: 91.9 fL (ref 80.0–100.0)
Platelets: 98 10*3/uL — ABNORMAL LOW (ref 150–400)
RBC: 2.36 MIL/uL — ABNORMAL LOW (ref 3.87–5.11)
RDW: 20.3 % — ABNORMAL HIGH (ref 11.5–15.5)
WBC: 9 10*3/uL (ref 4.0–10.5)
nRBC: 0 % (ref 0.0–0.2)

## 2021-01-23 LAB — PREPARE RBC (CROSSMATCH)

## 2021-01-23 LAB — BASIC METABOLIC PANEL
Anion gap: 9 (ref 5–15)
BUN: 29 mg/dL — ABNORMAL HIGH (ref 8–23)
CO2: 25 mmol/L (ref 22–32)
Calcium: 7.7 mg/dL — ABNORMAL LOW (ref 8.9–10.3)
Chloride: 104 mmol/L (ref 98–111)
Creatinine, Ser: 4.21 mg/dL — ABNORMAL HIGH (ref 0.44–1.00)
GFR, Estimated: 11 mL/min — ABNORMAL LOW (ref >60)
Glucose, Bld: 140 mg/dL — ABNORMAL HIGH (ref 70–99)
Potassium: 3.1 mmol/L — ABNORMAL LOW (ref 3.5–5.1)
Sodium: 138 mmol/L (ref 135–145)

## 2021-01-23 LAB — GLUCOSE, CAPILLARY
Glucose-Capillary: 127 mg/dL — ABNORMAL HIGH (ref 70–99)
Glucose-Capillary: 102 mg/dL — ABNORMAL HIGH (ref 70–99)
Glucose-Capillary: 129 mg/dL — ABNORMAL HIGH (ref 70–99)
Glucose-Capillary: 115 mg/dL — ABNORMAL HIGH (ref 70–99)

## 2021-01-23 MED ORDER — SODIUM CHLORIDE 0.9% IV SOLUTION: Freq: Once | INTRAVENOUS | Status: DC

## 2021-01-23 NOTE — Progress Notes (Signed)
Occupational Therapy Treatment Patient Details Name: Autumn [name] MRN: [id number] DOB: [birth year] Today's Date: 01/23/2021   History of present illness Pt is a 71 y/o female admitted 12/29 c SOB.  +COVID, with pneumonia,  acute on chronic renal failure, AKI, UTI.  GIB 1/1. Nursing cleared today for PT eval.  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 Pt while sitting at EOB had increase in ability to remain at midline but more then several mins started to posterior lean and needed visual target. Pt attempted to complete sit to stand with max x2 but did become orthostatic.  Pt currently with functional limitations due to the deficits listed below (see OT Problem List).  Pt will benefit from skilled OT to increase their safety and independence with ADL and functional mobility for ADL to facilitate discharge to venue listed below.     Recommendations for follow up therapy are one component of a multi-disciplinary discharge planning process, led by the attending physician.  Recommendations may be updated based on patient status, additional functional criteria and insurance authorization.    Follow Up Recommendations  Skilled nursing-short term rehab (<3 hours/day)    Assistance Recommended at Discharge Frequent or constant Supervision/Assistance  Patient can return home with the following      Equipment Recommendations       Recommendations for Other Services      Precautions / Restrictions Precautions Precautions: Fall Precaution Comments: post op shoe to L LE Restrictions Weight Bearing Restrictions: Yes LLE Weight Bearing: Weight bearing as tolerated Other Position/Activity Restrictions: post op shoe on LLE       Mobility Bed Mobility Overal bed mobility: Needs Assistance Bed Mobility: Supine to Sit;Sit  to Supine;Rolling Rolling: Mod assist Sidelying to sit: Mod assist Supine to sit: Mod assist;+2 for physical assistance;+2 for safety/equipment;HOB elevated Sit to supine: Max assist;+2 for physical assistance;+2 for safety/equipment Sit to sidelying: Mod assist General bed mobility comments: Pt cued to flex knee and reach to bed rail to roll each direction, modA. ModAx2 to initiate leg movement off L EOB, better initiation of R leg this date, but needing repeated cues to sequence legs off L EOB as she would forget and move to the R at times. MaxAx2 to manage legs and trunk back to supine.    Transfers Overall transfer level: Needs assistance Equipment used: Rolling walker (2 wheels) Transfers: Sit to/from Stand Sit to Stand: Max assist;+2 physical assistance;+2 safety/equipment          Lateral/Scoot Transfers: Max assist;+2 physical assistance;+2 safety/equipment General transfer comment: MaxAx2 with use of bed pad under buttocks to extend hips and knee block provided to stand from EOB with 1 hand pushing up from bed and 1 on RW. Pt maintains trunk flexed posture with mod success at extending trunk some with max cues and assistance.     Balance Overall balance assessment: Needs assistance Sitting-balance support: Feet supported;Single extremity supported;Bilateral upper extremity supported Sitting balance-Leahy Scale: Poor Sitting balance - Comments: UE support and pt with tendency to lean posteriorly. Improved midline alignment, but needed repeated cues to bring her nose anteriorly to a target, ranging from needing min guard-minA to sit EOB. Propped onto R elbow several reps and pushed up with max cues. Postural control: Posterior lean Standing balance support: Bilateral upper extremity  supported Standing balance-Leahy Scale: Zero Standing balance comment: Bil UE support and maxAx2 to stand at [facility] with hips and trunk flexed                           ADL either performed  or assessed with clinical judgement   ADL Overall ADL's : Needs assistance/impaired Eating/Feeding: Moderate assistance;Cueing for safety;Cueing for sequencing;Sitting   Grooming: Wash/dry hands;Wash/dry face;Maximal assistance;Cueing for safety;Cueing for sequencing;Sitting   Upper Body Bathing: Maximal assistance;Cueing for safety;Cueing for sequencing;Sitting;Bed level   Lower Body Bathing: Total assistance;Cueing for safety;Cueing for sequencing   Upper Body Dressing : Maximal assistance;Cueing for safety;Cueing for sequencing;Sitting;Bed level   Lower Body Dressing: Total assistance;Bed level     Toilet Transfer Details (indicate cue type and reason): deferred Toileting- Clothing Manipulation and Hygiene: Total assistance;Bed level         General ADL Comments: Attempted to stand but became orthostatic    Extremity/Trunk Assessment Upper Extremity Assessment Upper Extremity Assessment: RUE deficits/detail;LUE deficits/detail RUE Deficits / Details: Pt noted from last session increase use of RUE to reach and grasp items but very limited of AROM to about 25 degrees at shoulder level in supine LUE Deficits / Details: Pt increase in AROM at elbow and distally but limited AROM at shoulder as crepitus and PROM to about 160 at shoulder flexion   Lower Extremity Assessment Lower Extremity Assessment: Defer to PT evaluation        Vision       Perception     Praxis      Cognition Arousal/Alertness: Awake/alert Behavior During Therapy: [place] for tasks assessed/performed;Anxious Overall Cognitive Status: Impaired/Different from baseline Area of Impairment: Problem solving;Awareness;Safety/judgement;Following commands;Memory;Attention;Orientation                 Orientation Level: Disoriented to;Time Current Attention Level: Sustained Memory: Decreased recall of precautions;Decreased short-term memory Following Commands: Follows one step commands with increased  time;Follows one step commands consistently Safety/Judgement: Decreased awareness of safety;Decreased awareness of deficits Awareness: Intellectual Problem Solving: Slow processing;Decreased initiation;Difficulty sequencing;Requires verbal cues;Requires tactile cues General Comments: Pt with increased processing and initiation time, especially when motor planning R side of body (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 Poor awareness and attention to R side also. Poor awareness into deficits and problem-solving how to fix them, like sitting posture, needing repeated cues . Follows simple commands with increased time and cues.          Exercises General Exercises - Upper Extremity Shoulder Flexion: AROM;Both;5 reps;Seated   Shoulder Instructions       General Comments BP 132/74 supine, 127/91 sitting, 119/83 sitting after standing, 137/76 supine end of session, symptoms noted; SpO2 >/= 94% on RA; HR up to 124    Pertinent Vitals/ Pain       Pain Assessment: Faces Faces Pain Scale: Hurts little more Breathing: occasional labored breathing, short period of hyperventilation Negative Vocalization: none Facial Expression: sad, frightened, frown Body Language: tense, distressed pacing, fidgeting Consolability: distracted or reassured by voice/touch PAINAD Score: 4 Facial Expression: Relaxed, neutral Body Movements: Absence of movements Muscle Tension: Relaxed Compliance with ventilator (intubated pts.): N/A Vocalization (extubated pts.): N/A CPOT Total: 0 Pain Location: generalized with movement and pericare Pain Descriptors / Indicators: Discomfort;Grimacing;Guarding Pain Intervention(s): Limited activity within patient's tolerance;Monitored during session;Repositioned  Home Living  Prior Functioning/Environment              Frequency  Min 2X/week        Progress Toward Goals  OT Goals(current goals  can now be found in the care plan section)  Progress towards OT goals: Progressing toward goals  Acute Rehab OT Goals Patient Stated Goal: to rest OT Goal Formulation: With patient Time For Goal Achievement: 01/27/21 Potential to Achieve Goals: Fair ADL Goals Pt Will Perform Grooming: with set-up;sitting Pt Will Perform Upper Body Bathing: with min assist;sitting Pt Will Perform Lower Body Dressing: with mod assist;sitting/lateral leans Pt Will Transfer to Toilet: squat pivot transfer;bedside commode;with max assist Pt/caregiver will Perform Home Exercise Program: Increased strength;Increased ROM;Both right and left upper extremity;With written HEP provided;With minimal assist Additional ADL Goal #1: Pt will complete rolling and bed mobility with no more than min assist as precursor to ADLs.  Plan Discharge plan remains appropriate;Frequency needs to be updated    Co-evaluation    PT/OT/SLP Co-Evaluation/Treatment: Yes Reason for Co-Treatment: Necessary to address cognition/behavior during functional activity;Complexity of the patient's impairments (multi-system involvement)   OT goals addressed during session: ADL's and self-care      AM-PAC OT "6 Clicks" Daily Activity     Outcome Measure   Help from another person eating meals?: A Little Help from another person taking care of personal grooming?: A Lot Help from another person toileting, which includes using toliet, bedpan, or urinal?: Total Help from another person bathing (including washing, rinsing, drying)?: A Lot Help from another person to put on and taking off regular upper body clothing?: A Lot Help from another person to put on and taking off regular lower body clothing?: Total 6 Click Score: 11    End of Session Equipment Utilized During Treatment: Oxygen  OT Visit Diagnosis: Other abnormalities of gait and mobility (R26.89);Muscle weakness (generalized) (M62.81) Hemiplegia - Right/Left: Right Hemiplegia -  dominant/non-dominant: Dominant Hemiplegia - caused by: Cerebral infarction   Activity Tolerance Patient tolerated treatment well   Patient Left in bed;with call bell/phone within reach;with bed alarm set   Nurse Communication Mobility status        Time: 5974-1638 OT Time Calculation (min): 36 min  Charges: OT General Charges $OT Visit: 1 Visit OT Treatments $Self Care/Home Management : 8-22 mins  Joeseph Amor OTR/L  Acute Rehab Services  [phone] office number [phone] pager number   Joeseph Amor 01/23/2021, 12:26 PM

## 2021-01-23 NOTE — Progress Notes (Signed)
Advised by CSW that insurance auth for snf is still pending. Message left at  [facility] to make clinic aware pt will not start tomorrow. Message left for Wynona Canes [phone]) with [employer] to inform her that pt will not start tomorrow as hoped. Will follow and assist.   Melven Sartorius Renal Navigator [phone]

## 2021-01-23 NOTE — Progress Notes (Signed)
[facility] Progress Note   71 y.o. female with a past medical history significant for DM2, HTN, hypothyroidism, NASH cirrhosis, pAfib, CVA, admitted for COVID pneumonia c/b AKI on top of advanced CKD4.     Assessment/ Plan:   1) AKI on CKD 4: Baseline CKD likely arterionephrosclerosis with diabetic kidney disease and possibly some contribution of HRS given cirrhosis although less likely.    UA does show some protein but not extensive.  CKD has been progressive also with recent AKI during hospitalization for osteomyelitis.  Baseline creatinine around 3.6-4 at this time.   Now with AKI likely secondary to ATN associated with COVID.  Creatinine continues to slowly worsen with levels >7.  HD #1 on 1/4  Appreciate VIR tunneled catheter for initiation of dialysis; tolerated HD #3 Sat 01/18/21   TTS regimen- next HD 01/23/10 for short run, can d/c afterwards  Appreciate Dr. Carlis Abbott VVS seeing the patient and placing access  CLIP to [facility] MWF   -Continue to monitor daily Cr, Dose meds for GFR -Monitor Daily I/Os, Daily weight  -Maintain MAP>65 for optimal renal perfusion.  -Avoid nephrotoxic medications including NSAIDs.   2) Acute hypoxic respiratory failure secondary to COVID-pneumonia: overall improved.  Continue management per primary team.     3) Anemia: Likely multifactorial with acute illness, chronic inflammation, CKD contributing.  Continue transfusion per primary team.  - S/P  transfusion; GI believes it's more c/w hemorrhoidal bleeding and no scope indicated.   5) UTI: Pyuria on urinalysis.  Minimal symptoms were because of AKI we will treat.  Ceftriaxone 1 g x 5 days.     6) Atrial fibrillation: tachycardic now on HD w/ UF. Management per primary  7) Left foot wound: Status post amputation, wound open.  Management per primary  8) Diabetes Mellitus Type 2 with Hyperglycemia: Management per primary  9) Renal osteodystrophy - phos 3.6 1/6     Subjective:    For short run of HD today.  No issues.      Objective:   BP 139/80    Pulse (!) 106    Temp (!) 97.1 F (36.2 C)    Resp (!) 38    Ht 5\' 7"  (1.702 m)    Wt 81.4 kg    SpO2 96%    BMI 28.11 kg/m   Intake/Output Summary (Last 24 hours) at 01/23/2021 1253 Last data filed at 01/23/2021 1031 Gross per 24 hour  Intake 315 ml  Output 100 ml  Net 215 ml   Weight change: -2.4 kg  Physical Exam: Constitutional: NAD, sitting in bed CV: tachy  Respiratory: clear bilaterally Gastrointestinal: soft, nontender Skin: no visible lesions or rashes Ext: trace LE edema, bilateral UE edema Access: RIJ TC, rt BBF good thrill  Imaging: CT HEAD WO CONTRAST (5MM)  Result Date: 01/22/2021 CLINICAL DATA:  Mental status change. EXAM: CT HEAD WITHOUT CONTRAST TECHNIQUE: Contiguous axial images were obtained from the base of the skull through the vertex without intravenous contrast. RADIATION DOSE REDUCTION: This exam was performed according to the departmental dose-optimization program which includes automated exposure control, adjustment of the mA and/or kV according to patient size and/or use of iterative reconstruction technique. COMPARISON:  CT head 11/21/2020. MRI 11/13/2020. FINDINGS: Brain: There is no evidence of acute intracranial hemorrhage, mass lesion, brain edema or extra-axial fluid collection. Stable atrophy with prominence of the ventricles and subarachnoid spaces. Stable subcortical infarct in the posterior left frontal region. There is no CT evidence of acute  cortical infarction. Chronic small vessel ischemic changes in the periventricular white matter appear unchanged. Vascular: Intracranial vascular calcifications. No hyperdense vessel identified. Skull: Negative for fracture or focal lesion. Sinuses/Orbits: The visualized paranasal sinuses and mastoid air cells are clear. No orbital abnormalities are seen. Other: None. IMPRESSION: 1. Stable CT of the appearance with atrophy,  chronic small vessel ischemic changes and an old infarct in the posterior left frontal lobe. 2. No acute intracranial findings. Electronically Signed   By: Richardean Sale M.D.   On: 01/22/2021 09:35    Labs: BMET Recent Labs  Lab 01/17/21 0805 01/18/21 0357 01/19/21 0423 01/20/21 3338 01/21/21 0251 01/22/21 0407 01/23/21 0342  NA 137 137 140 137 135 139 138  K 3.5 3.3* 3.1* 3.2* 2.8* 3.0* 3.1*  CL 100 104 100 99 103 102 104  CO2 26 25 28 26 23 26 25   GLUCOSE 127* 170* 140* 112* 170* 109* 140*  BUN 39* 43* 28* 31* 39* 19 29*  CREATININE 3.59* 4.04* 3.08* 3.77* 4.21* 2.84* 4.21*  CALCIUM 8.2* 8.2* 8.0* 8.3* 7.7* 7.8* 7.7*  PHOS 3.6  --   --   --  3.0  --   --    CBC Recent Labs  Lab 01/20/21 0655 01/21/21 0251 01/22/21 0407 01/23/21 0342  WBC 9.0 9.7 7.6 9.0  HGB 8.2* 7.3* 7.2* 6.8*  HCT 25.1* 22.0* 23.1* 21.7*  MCV 90.6 88.0 92.4 91.9  PLT 80* 90* 87* 98*    Medications:     (feeding supplement) PROSource Plus  30 mL Oral BID BM   sodium chloride   Intravenous Once   apixaban  5 mg Oral BID   atorvastatin  40 mg Oral Daily   chlorhexidine  15 mL Mouth/Throat Once   Or   mouth rinse  15 mL Mouth Rinse Once   Chlorhexidine Gluconate Cloth  6 each Topical Daily   collagenase   Topical Daily   diltiazem  180 mg Oral Daily   feeding supplement  1 Container Oral TID BM   hydrocortisone  25 mg Rectal BID   insulin aspart  0-15 Units Subcutaneous TID WC   insulin aspart  0-5 Units Subcutaneous QHS   levothyroxine  100 mcg Oral Q0600   metoprolol tartrate  50 mg Oral BID   multivitamin  1 tablet Oral QHS   pantoprazole  40 mg Oral BID   polyethylene glycol  17 g Oral Daily   senna-docusate  2 tablet Oral BID     Madelon Lips MD 01/23/2021, 12:53 PM

## 2021-01-23 NOTE — Plan of Care (Signed)
  Problem: Education: Goal: Knowledge of General Education information will improve Description Including pain rating scale, medication(s)/side effects and non-pharmacologic comfort measures Outcome: Progressing   

## 2021-01-23 NOTE — Progress Notes (Signed)
Chaplain Melvenia Beam conducted an initial spiritual care assessment of patient at her bedside. Introduced myself to patient, who extremely grateful for the visit. Explained that I heard she desired some form of daily devotion during her hospital stay. Patient acknowledged the request. Chaplain asked patient open ended questions of what that would look like for her. Patient stated she would like some form of uplifting and inspiring daily reading that was Bible based. I asked patient to explain her spiritual beliefs. Patient stated that she has always had faith in God and her Reita Cliche, and that she prayed daily. When asked how she prayed, patient recited the "Lord's Prayer."   Chaplain suggested the possibility of a Bible app for her phone that could provide her daily devotions automatically for her to review, and that it was something she could continue after her hospital discharge. Patient stated that she recently lost her phone. Offered to print something for her that she could read.  Per the patient's request, Chaplain provided prayers for healing, comfort, and reassurance. Told patient that I would follow up with her later in the day. Patient was extremely thankful for Chaplain visit and prayer support.   Chaplain is available for further consultation with patient and/or staff as requested.

## 2021-01-23 NOTE — Progress Notes (Signed)
Physical Therapy Treatment Patient Details Name: Autumn [name] MRN: [id number] DOB: [birth year] Today's Date: 01/23/2021   History of Present Illness Pt is a 71 y/o female admitted 12/29 c SOB.  +COVID, with pneumonia,  acute on chronic renal failure, AKI, UTI.  GIB 1/1. Nursing cleared today for PT eval.  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 She was able to successfully stand, even though not extending her hips and trunk fully due to weakness,  with maxAx2. She continues to display limitations in bil ankle dorsiflexion ROM that impact her from being able to achieve adequate positioning for transfers. Performed stretches and positioned ankles in a gentle stretch into dorsiflexion supine in bed end of session. Pt's prevalon boots need cleaning at this time. Pt limited in mobility by symptomatic orthostatic hypotension this date, see below. Will continue to follow acutely. Current recommendations remain appropriate.  BP:  132/74 supine 127/91 sitting 119/83 sitting after standing 137/76 supine end of session    Recommendations for follow up therapy are one component of a multi-disciplinary discharge planning process, led by the attending physician.  Recommendations may be updated based on patient status, additional functional criteria and insurance authorization.  Follow Up Recommendations  Skilled nursing-short term rehab (<3 hours/day)     Assistance Recommended at Discharge Frequent or constant Supervision/Assistance  Patient can return home with the following Two people to help with walking and/or transfers;A lot of help with bathing/dressing/bathroom;Two people to help with bathing/dressing/bathroom;A lot of help with walking and/or transfers;Assistance with  feeding;Assistance with cooking/housework;Direct supervision/assist for medications management;Direct supervision/assist for financial management;Assist for transportation;Help with stairs or ramp for entrance   Equipment Recommendations  None recommended by PT    Recommendations for Other Services       Precautions / Restrictions Precautions Precautions: Fall Precaution Comments: post op shoe to L LE Restrictions Weight Bearing Restrictions: Yes LLE Weight Bearing: Weight bearing as tolerated Other Position/Activity Restrictions: post op shoe on LLE     Mobility  Bed Mobility Overal bed mobility: Needs Assistance Bed Mobility: Supine to Sit;Sit to Supine;Rolling Rolling: Mod assist   Supine to sit: Mod assist;+2 for physical assistance;+2 for safety/equipment;HOB elevated Sit to supine: Max assist;+2 for physical assistance;+2 for safety/equipment   General bed mobility comments: Pt cued to flex knee and reach to bed rail to roll each direction, modA. ModAx2 to initiate leg movement off L EOB, better initiation of R leg this date, but needing repeated cues to sequence legs off L EOB as she would forget and move to the R at times. MaxAx2 to manage legs and trunk back to supine.    Transfers Overall transfer level: Needs assistance Equipment used: Rolling walker (2 wheels) Transfers: Sit to/from Stand Sit to Stand: Max assist;+2 physical assistance;+2 safety/equipment           General transfer comment: MaxAx2 with use of bed pad under buttocks to extend hips and knee block provided to stand from EOB with 1 hand pushing up from bed and 1 on RW. Pt maintains trunk flexed posture with mod success at extending trunk some with max cues and assistance.    Ambulation/Gait               General Gait Details: deferred  Stairs             Wheelchair Mobility    Modified Rankin (Stroke Patients Only) Modified Rankin (Stroke Patients Only) Pre-Morbid Rankin  Score: No symptoms Modified Rankin: Moderately severe disability     Balance Overall balance assessment: Needs assistance Sitting-balance support: Feet supported;Single extremity supported;Bilateral upper extremity supported Sitting balance-Leahy Scale: Poor Sitting balance - Comments: UE support and pt with tendency to lean posteriorly. Improved midline alignment, but needed repeated cues to bring her nose anteriorly to a target, ranging from needing min guard-minA to sit EOB. Propped onto R elbow several reps and pushed up with max cues. Postural control: Posterior lean Standing balance support: Bilateral upper extremity supported Standing balance-Leahy Scale: Zero Standing balance comment: Bil UE support and maxAx2 to stand at RW with hips and trunk flexed                            Cognition Arousal/Alertness: Awake/alert Behavior During Therapy: [facility] for tasks assessed/performed;Anxious Overall Cognitive Status: Impaired/Different from baseline Area of Impairment: Problem solving;Awareness;Safety/judgement;Following commands;Memory;Attention;Orientation                 Orientation Level: Disoriented to;Time Current Attention Level: Sustained Memory: Decreased recall of precautions;Decreased short-term memory Following Commands: Follows one step commands with increased time;Follows one step commands consistently Safety/Judgement: Decreased awareness of safety;Decreased awareness of deficits Awareness: Intellectual Problem Solving: Slow processing;Decreased initiation;Difficulty sequencing;Requires verbal cues;Requires tactile cues General Comments: Pt with increased processing and initiation time, especially when motor planning R side of body (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 Poor awareness and attention to R side also. Poor awareness into deficits and problem-solving how to fix them, like sitting posture, needing repeated cues . Follows simple  commands with increased time and cues.        Exercises Other Exercises Other Exercises: Passive stretch to bil ankles into dorsiflexion through pressure applied at leg with feet on ground and pillow placed at end of bed to push ankles gentle into dorsiflexion as prevalon boots were in red bag needing cleaning Other Exercises: Propping and pushing up from R elbow/forearm in sitting, x4 reps    General Comments General comments (skin integrity, edema, etc.): BP 132/74 supine, 127/91 sitting, 119/83 sitting after standing, 137/76 supine end of session, symptoms noted; SpO2 >/= 94% on RA; HR up to 124      Pertinent Vitals/Pain Pain Assessment: Faces Faces Pain Scale: Hurts little more Pain Location: generalized with movement and pericare Pain Descriptors / Indicators: Discomfort;Grimacing;Guarding Pain Intervention(s): Limited activity within patient's tolerance;Monitored during session;Repositioned    Home Living                          Prior Function            PT Goals (current goals can now be found in the care plan section) Acute Rehab PT Goals Patient Stated Goal: to get better PT Goal Formulation: With patient Time For Goal Achievement: 01/27/21 Potential to Achieve Goals: Fair Progress towards PT goals: Progressing toward goals    Frequency    Min 2X/week      PT Plan Current plan remains appropriate    Co-evaluation PT/OT/SLP Co-Evaluation/Treatment: Yes Reason for Co-Treatment: Necessary to address cognition/behavior during functional activity;For patient/therapist safety;To address functional/ADL transfers PT goals addressed during session: Mobility/safety with mobility;Balance;Strengthening/ROM        AM-PAC PT "6 Clicks" Mobility  Outcome Measure  Help needed turning from your back to your side while in a flat bed without using bedrails?: A Lot Help needed moving from lying on your back to sitting on the side of a flat bed without using  bedrails?: Total Help needed moving to and from a bed to a chair (including a wheelchair)?: Total Help needed standing up from a chair using your arms (e.g., wheelchair or bedside chair)?: Total Help needed to walk in hospital room?: Total Help needed climbing 3-5 steps with a railing? : Total 6 Click Score: 7    End of Session Equipment Utilized During Treatment: Other (comment);Gait belt (L post op shoe) Activity Tolerance: Other (comment) (limited by orthostatic hypotension) Patient left: in bed;with call bell/phone within reach;with bed alarm set Nurse Communication: Mobility status PT Visit Diagnosis: Muscle weakness (generalized) (M62.81);Difficulty in walking, not elsewhere classified (R26.2);Other abnormalities of gait and mobility (R26.89);Unsteadiness on feet (R26.81);Other symptoms and signs involving the nervous system (R29.898) Hemiplegia - Right/Left: Right Hemiplegia - dominant/non-dominant: Dominant Hemiplegia - caused by: Cerebral infarction     Time: 9476-5465 PT Time Calculation (min) (ACUTE ONLY): 36 min  Charges:  $Therapeutic Activity: 8-22 mins                     Moishe Spice, PT, DPT Acute Rehabilitation Services  Pager: [phone] Office: [place] 01/23/2021, 8:33 AM

## 2021-01-23 NOTE — Progress Notes (Signed)
HD#14 SUBJECTIVE:  Patient Summary: Autumn [name] is a 71 y.o. 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    Overnight Events: No acute events overnight  Interim History: This is hospital day 14 for this patient who was seen and evaluated at dialysis today. She is much more interactive and feels well. She has no acute complaints.   OBJECTIVE:  Vital Signs: Vitals:   01/23/21 1200 01/23/21 1230 01/23/21 1300 01/23/21 1305  BP: 129/70 139/80 118/64 135/71  Pulse: 90 (!) 106 (!) 105 90  Resp: (!) 42 (!) 38 (!) 23 (!) 24  Temp:      TempSrc:      SpO2:  96% 100% 100%  Weight:      Height:       Supplemental O2: Room Air SpO2: 100 % O2 Flow Rate (L/min): 2 L/min FiO2 (%): 40 %  Filed Weights   01/21/21 1115 01/22/21 0007 01/23/21 0506  Weight: 82.3 kg 83.4 kg 81.4 kg     Intake/Output Summary (Last 24 hours) at 01/23/2021 1418 Last data filed at 01/23/2021 1300 Gross per 24 hour  Intake 315 ml  Output 1100 ml  Net -785 ml   Net IO Since Admission: -3,262.36 mL [01/23/21 1418]  Physical Exam: General: Chronically ill appearing female laying in bed. No acute distress. CV: Regular rate, irregular rhythm. No murmurs, rubs, or gallops. 1+ LE edema Pulmonary: Lungs CTAB. Normal effort.  Extremities: R AVF with thrill. 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 Ecchymosis over chest and arms Neuro: Awake and alert. Able to follow commands and is much more verbally interactive today Psych: Normal mood and affect      ASSESSMENT/PLAN:  Assessment: Principal Problem:   COVID-19 Active Problems:   Atrial fibrillation with RVR (HCC)   Acute kidney injury superimposed on CKD (HCC)   Liver cirrhosis  secondary to NASH (nonalcoholic steatohepatitis) (HCC)   Anemia of chronic disease   Type II diabetes mellitus (HCC)   GI bleeding   Protein-calorie malnutrition, severe   Plan: #AKI on CKD IV, progressing to ESRD on HD Patient seen at dialysis today and tolerating it well. Remains oliguric. Will be transitioning to MWF HD schedule when patient is discharged to SNF.  - Will discharge to [place] [facility] with [facility] for HD - Continue strict I&Os and daily weights  #Lethargy and altered mental status Patient likely experiencing hospital delirium with waxing and waning course. Recommend patient work with PT/OT and get out of bed as able. She is much improved today and is more awake and interactive. Will continue to ensure patient does not get any sedating medications.   #Acute on chronic anemia #Thrombocytopenia Hb dropped to 6.8 and the patient received 1u PRBC while in dialysis. No active signs of bleeding noted.  - Daily CBC - Continue eliquis 5 mg bid   Best Practice: Diet: Renal diet IVF: Fluids: none VTE: Eliquis Code: Full AB: none Therapy Recs: SNF Family Contact: Friend, Nancy, called and unable to be notified. DISPO: Anticipated discharge  today or tomorrow  to Skilled nursing facility pending  placement .  Signature: Buddy Duty, D.O.  Internal Medicine Resident, PGY-1 [facility]  Pager: [phone] 2:18 PM, 01/23/2021  Please contact the on call pager after 5 pm and on weekends at [phone].

## 2021-01-23 NOTE — Progress Notes (Signed)
Chaplain Melvenia Beam printed daily inspirational devotional for January 23, 2021 from the [email or web address] web site and attempted follow up visit with patient. Patient was away from room and not available. Spoke with nurse who stated patient had been taken to dialysis for treatment. Printed devotional was taped to patients bedside table.  Chaplain is available for further consultation with patient and/or staff as requested at [phone].

## 2021-01-23 NOTE — Discharge Summary (Addendum)
Name: Autumn [name] MRN: [id number] DOB: [birth year] 71 y.o. PCP: Harlow Ohms, MD  Date of Admission: 01/09/2021 12:19 PM Date of Discharge:  01/24/2021 Attending Physician: Dr. 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 Afib with RVR COVID-19 Liver cirrhosis 2/2 NASH Acute on chronic anemia  Thrombocytopenia Hemorrhoidal bleeding Protein-calorie malnutrition Deconditioning  DISCHARGE MEDICATIONS:   Allergies as of 01/24/2021       Reactions   Codeine Nausea And Vomiting        Medication List     STOP taking these medications    ascorbic acid 500 MG tablet Commonly known as: VITAMIN C   doxycycline 100 MG tablet Commonly known as: VIBRA-TABS   levofloxacin 500 MG tablet Commonly known as: LEVAQUIN   metroNIDAZOLE 500 MG tablet Commonly known as: FLAGYL   multivitamin with minerals Tabs tablet   predniSONE 20 MG tablet Commonly known as: DELTASONE   torsemide 20 MG tablet Commonly known as: Demadex   Tubersol 5 UNIT/0.1ML injection Generic drug: tuberculin       TAKE these medications    (feeding supplement) PROSource Plus liquid Take 30 mLs by mouth 3 (three) times daily between meals.   acetaminophen 325 MG tablet Commonly known as: TYLENOL Take 325 mg by mouth every 6 (six) hours as needed.   albuterol (2.5 MG/3ML) 0.083% nebulizer solution Commonly known as: PROVENTIL Take 2.5 mg by nebulization every 8 (eight) hours as needed for wheezing or shortness of breath.   albuterol 108 (90 Base) MCG/ACT inhaler Commonly known as:  VENTOLIN HFA Inhale 2 puffs into the lungs 4 (four) times daily as needed for wheezing.   allopurinol 100 MG tablet Commonly known as: ZYLOPRIM Take 0.5 tablets (50 mg total) by mouth every other day.   ALPRAZolam 0.25 MG tablet Commonly known as: XANAX Take 1 tablet (0.25 mg total) by mouth 2 (two) times daily as needed for anxiety.   apixaban 5 MG Tabs tablet Commonly known as: ELIQUIS Take 1 tablet (5 mg total) by mouth 2 (two) times daily.   atorvastatin 40 MG tablet Commonly known as: LIPITOR Take 1 tablet (40 mg total) by mouth daily.   dicyclomine 10 MG capsule Commonly known as: BENTYL Take 1 capsule (10 mg total) by mouth 4 (four) times daily -  before meals and at bedtime.   diltiazem 180 MG 24 hr capsule Commonly known as: CARDIZEM CD Take 1 capsule (180 mg total) by mouth daily.   ferrous gluconate 324 MG tablet Commonly known as: FERGON Take 1 tablet (324 mg total) by mouth daily with breakfast.   furosemide 80 MG tablet Commonly known as: Lasix Take 1 tablet (80 mg total) by mouth daily for 4 days.   hydrALAZINE 100 MG tablet Commonly known as: APRESOLINE Take 1 tablet (100 mg total) by mouth every 8 (eight) hours.   levothyroxine 100 MCG tablet Commonly known as: SYNTHROID Take 100 mcg by mouth every morning.   liraglutide 18 MG/3ML Sopn Commonly known as: VICTOZA Inject into the skin.   meclizine 12.5 MG tablet Commonly known as: ANTIVERT Take  12.5 mg by mouth 3 (three) times daily as needed for dizziness.   metoprolol tartrate 50 MG tablet Commonly known as: LOPRESSOR Take 1 tablet (50 mg total) by mouth 2 (two) times daily.   ondansetron 4 MG tablet Commonly known as: ZOFRAN Place 1 tablet (4 mg total) into feeding tube every 6 (six) hours as needed for nausea or vomiting.   pantoprazole 40 MG tablet Commonly known as: PROTONIX Take 1 tablet (40 mg total) by mouth 2 (two) times daily.   polyethylene glycol 17 g packet Commonly known  as: MIRALAX / GLYCOLAX Take 17 g by mouth daily as needed for severe constipation.   potassium chloride 20 MEQ packet Commonly known as: Klor-Con Take 40 mEq by mouth daily for 4 days.   venlafaxine 50 MG tablet Commonly known as: EFFEXOR Take 1 tablet (50 mg total) by mouth 2 (two) times daily.               Discharge Care Instructions  (From admission, onward)           Start     Ordered   01/24/21 0000  Discharge wound care:       Comments: Clean left foot wound with saline, pat dry. Apply 1/4" thick layer of Santyl to the wound bed, making sure to get ointment down into the deepest portion. Pack small 2x2 moist with saline (damp not saturated) into wound and cover top of wound bed. Top with foam. Ok to lift foam to change daily, ok to change foam every 3 days.   01/24/21 1321            DISPOSITION AND FOLLOW-UP:  Ms.Lorey S Pressman was discharged from [facility] in [place] condition. At the hospital follow up visit please address:  CKD/ESRD: Assess how patient is tolerating outpatient HD. Follow up electrolytes. Take lasix 80 mg daily and Kclor 38mEq daily until dialysis starts on Monday  Acute on chronic anemia: Recheck CBC, assess for any signs of GI or hemorrhoidal bleeding.  Afib/HTN: Continued home cardizem and metoprolol during admission. Discontinued torsemide in the setting of worsening kidney function. Can resume home hydralazine  Protein calorie malnutrition: Encourage Boost/Ensure supplementation  Left foot wound: No active signs of infection, reassess at hospital follow up.   Follow-up Recommendations: Consults: Nephrology Labs: Basic Metabolic Profile and CBC Studies: none New medications: Take lasix 80 mg daily until Monday (4 days) and kclor 52mEq daily until Monday  Follow-up Appointments:  Follow-up Information     [facility] Follow up in 6 week(s).   Specialty: Vascular Surgery Why:  Office will call you to arrange your appt (sent) Contact information: [address] [place] [address] [phone]        Harlow Ohms, MD. Call in 2 week(s).   Specialty: Geriatric Medicine Why: hospital follow up Contact information: [address] [place] [address] [phone]         Nelva Bush, MD .   Specialty: Cardiology Contact information: [address] [place] [address] [phone]         Harlow Ohms, MD. Schedule an appointment as soon as possible for a visit in 2 week(s).   Specialty: Geriatric Medicine Why: hospital follow up Contact information: [address] [facility] [place] [address] [phone]         Nelva Bush, MD .   Specialty:  Cardiology Contact information: [place] [address] [phone]         [facility] Follow up.   Why: Schedule is Monday, Wednesday, Friday Arrive at 10:30 for first appointment.  11:00 chair time. Contact information: [place], [place] [address]  [phone]                HOSPITAL COURSE:  Patient Summary: #AKI on CKD IV requiring new start HD Came in with reduced urine output in the setting of pyuria and presumed UTI as well as acute COVID-19 infection. On initial exam she was diffusely edematous and CXR on admission was concerning for some pulmonary edema. Her creatinine was baseline was around 3.6-3.0 and she was noted to have CKD IV. However on admission, her Cr was 6.31. Nephrology was consulted and she was given lasix and albumin together which increased urine output slightly but not significantly. After this, she was also given oral and IV sodium bicarb to try and help her acidosis. Despite these measures, creatinine did not show any improvement and slowly worsened during her  hospitalization. Patient had R IJ tunneled cath placed on 1/3 for temporary dialysis access. Vascular surgery was consulted for permanent access placement and patient was scheduled to have this done on 1/3 as well, however, this was delayed in the setting of ongoing GI bleeding. Bleeding resolved and patient had right AV fistula placed on 1/4. She received her first dialysis session on 1/4 and tolerated it well. Patient was then transitioned to Tuesday, Thursday, Saturday HD schedule while hospitalized and will be transitioned to Monday, Wednesday, Friday schedule per SNF. She remained oliguric during admission. CXR with no cavitary lesions in the lung fields and patient has no other signs of TB, thus patient is cleared for outpatient HD. On day of discharge patient received a short dialysis session in transition to her new MWF schedule.  She is at risk for ESRD in the near future, which will be evaluated in a few weeks by nephrology.  #Acute hypoxic respiratory failure due to COVID-19 pneumonia Patient came in acutely short of breath and was found to be COVID positive. She was initially requiring 4 L oxygen and then was put on BIPAP to try and help with her acidosis of 7.316 and a low bicarb. She was started on steroids with decadron 6 mg daily but was not given remdesivir given her renal failure as above. She was able to be weaned off of the supplemental O2 and inflammatory markers continued to trend down during hospitalization. Decadron was stopped after 6 days of therapy, as patient's respiratory function remained stable. Did not require supplemental O2 after this.   #Uremic bleeding #Upper GI bleed #Anemia of chronic disease Patient had previous hospitalization in November where she had melena on anticoagulation. EGD and colonoscopy at that time showed gastritis and a few colonic polyps, which were removed. Bleeding was self limited at that time, and the patient was safe to resume anticoagulation.  During this hospitalization, the patient had persistent bleeding with acute blood loss anemia requiring 4 total units of PRBC. She was anticoagulated with heparin for her Afib, however, this was discontinued on 1/1 in the setting of her bleeding. Patient was also noted to have significant ecchymosis on her arms and chest. She had an HD catheter placed on 1/3 and had oozing from the tunnel insertion site that day. Suspect this patient is having slow bleeding from her previously identified gastritis.  Her BUN on 1/3 was elevated to 106, and she had thrombocytopenia likely from NASH cirrhosis, putting patient at risk for uremic platelets which is worsening her bleeding. Treated upper GI bleed with IV protonix 40 mg bid, IV octreotide, midodrine 5 mg tid, and a dose of DDAVP. GI consulted and felt that bleeding was related to hemmorhoids, thus treated patient with hydrocortisone suppositories and did not pursue full GI bleed workout, as she has recently had this done. Bleeding resolved and patient was started back on anticoagulation. On 1/12, Hb dropped to 6.8 (from 7.2) and patient was given 1u PRBC. Post transfusion Hb stable.   #Protein-calorie malnutrition #Deconditioning Severe malnutrition 2/2 to multiple comorbidities. Continued Boost Breeze supplements tid per RD recommendations.   #Lethargy and altered mental status Patient reportedly more lethargic overnight on 1/11 and was difficult to arouse. ABG with no acidosis or hypercarbia noted. Upon our evaluation later that morning, she is difficult to arouse and will not keep her eyes open for a long period of time, thus obtained CT head. CT head with no acute intracranial abnormalities. Lactic acid is reassuring. We obtained blood cultures due to recent history of infection, looked at wound on her foot which did not show signs of purulence or cellulitis. This could be hospital delirium with waxing and waning course. No sedating medications on patient's med  list.   #Thrombocytopenia Thrombocytopenia is likely in the setting of uremia. Treated with DDAVP as noted above. Platelets remained stable.   #Cirrhosis 2/2 NASH No ascites noted on imaging or on exam. Patient was started on ceftriaxone daily in the setting of UTI, however, continued this antibiotic in the setting of SBP prophylaxis until her bleeding resolves.   #Chronic diastolic heart failure #Paroxysmal atrial fibrillation #Hypertension Patient was rate controlled on home metoprolol and cardizem which were initially held on admission in the setting of the acute illness above. She developed afib with RVR was briefly started on a cardizem drip which was transitioned back to her home dose of cardizem. Metoprolol was also added back the next day. Home torsemide was held. Held torsemide at discharge in the setting of her worsening kidney function, however, resumed hydralazine although this was held during admission. Recommend follow up with PCP and nephrologist regarding hydralazine.   #UTI Pyuria seen on UA and patient had minimal urinary symptoms. She was started on ceftriaxone 1 g for 5 days and the course of treatment ended on 1/2.   #Left foot Wound Had second left ray and metatarsal amputated at last hospital admission in November secondary to osteomyelitis. Was using a wound vac at SNF however this was not able to be brought with EMS. She was seen by wound care here and a wound vac was replaced.      DISCHARGE INSTRUCTIONS:   Discharge Instructions     Diet - low sodium heart healthy   Complete by: As directed    Discharge wound care:   Complete by: As directed    Clean left foot wound with saline, pat dry. Apply 1/4" thick layer of Santyl to the wound bed, making sure to get ointment down into the deepest portion. Pack small 2x2 moist with saline (damp not saturated) into wound and cover top of wound bed. Top with foam. Ok to lift foam to change daily, ok to change foam every 3  days.   Increase activity slowly   Complete by: As directed       Dear Ms. [place],  You were hospitalized  for worsening of your kidney function, that was likely caused by your COVID infection. You were hospitalized for a while and were started on dialysis, which you will continue to do on Monday, Wednesday, and Fridays. Your rehab facility will work on your transportation.   For your Afib, continue to take your cardizem and metoprolol to keep your heart rate under control.   Please be sure to let your doctors if you continue to have any bowel movements with bright red or dark blood. Please also let your doctor know if you have any drainage coming from the wound in your foot. I am glad you are feeling better and hope that you do well with rehab!  SUBJECTIVE:  NYAZIA CANEVARI was seen and evaluated on the day of discharge. She is tired, but overall she feels well. Has no acute complaints today.  Discharge Vitals:   BP 135/63 (BP Location: Left Arm)    Pulse 96    Temp 97.9 F (36.6 C) (Oral)    Resp (!) 22    Ht 5\' 7"  (1.702 m)    Wt 81.3 kg    SpO2 100%    BMI 28.07 kg/m   OBJECTIVE:  General: Chronically ill appearing female laying in bed. No acute distress. CV: Regular rate, irregular rhythm. No murmurs. 1+ LE edema Pulmonary: Lungs CTAB. Normal effort. No wheezing or rales. Extremities: 1st stage R AVF with thrill. 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  Skin: Warm and dry. Significant ecchymosis over chest and arms. Neuro: Awake and alert. Able to follow commands. More verbally interactive today.      Pertinent Labs, Studies, and Procedures:  CBC Latest Ref Rng & Units 01/24/2021 01/23/2021 01/22/2021  WBC 4.0 - 10.5 K/uL 6.6 9.0 7.6  Hemoglobin 12.0 - 15.0 g/dL 8.1(L) 6.8(LL) 7.2(L)  Hematocrit 36.0 - 46.0 % 25.6(L) 21.7(L) 23.1(L)  Platelets 150 - 400 K/uL 87(L) 98(L) 87(L)    CMP Latest Ref Rng & Units 01/24/2021 01/23/2021 01/22/2021  Glucose 70 - 99 mg/dL 125(H) 140(H)  109(H)  BUN 8 - 23 mg/dL 26(H) 29(H) 19  Creatinine 0.44 - 1.00 mg/dL 3.53(H) 4.21(H) 2.84(H)  Sodium 135 - 145 mmol/L 138 138 139  Potassium 3.5 - 5.1 mmol/L 3.2(L) 3.1(L) 3.0(L)  Chloride 98 - 111 mmol/L 104 104 102  CO2 22 - 32 mmol/L 27 25 26   Calcium 8.9 - 10.3 mg/dL 7.6(L) 7.7(L) 7.8(L)  Total Protein 6.5 - 8.1 g/dL - - -  Total Bilirubin 0.3 - 1.2 mg/dL - - -  Alkaline Phos 38 - 126 U/L - - -  AST 15 - 41 U/L - - -  ALT 0 - 44 U/L - - -     Signed: Buddy Duty, D.O.  Internal Medicine Resident, PGY-1 [facility]  Pager: [phone] 1:41 PM, 01/24/2021

## 2021-01-24 DIAGNOSIS — U071 COVID-19: Secondary | ICD-10-CM | POA: Diagnosis not present

## 2021-01-24 LAB — BASIC METABOLIC PANEL
Anion gap: 7 (ref 5–15)
BUN: 26 mg/dL — ABNORMAL HIGH (ref 8–23)
CO2: 27 mmol/L (ref 22–32)
Calcium: 7.6 mg/dL — ABNORMAL LOW (ref 8.9–10.3)
Chloride: 104 mmol/L (ref 98–111)
Creatinine, Ser: 3.53 mg/dL — ABNORMAL HIGH (ref 0.44–1.00)
GFR, Estimated: 14 mL/min — ABNORMAL LOW (ref >60)
Glucose, Bld: 125 mg/dL — ABNORMAL HIGH (ref 70–99)
Potassium: 3.2 mmol/L — ABNORMAL LOW (ref 3.5–5.1)
Sodium: 138 mmol/L (ref 135–145)

## 2021-01-24 LAB — GLUCOSE, CAPILLARY
Glucose-Capillary: 123 mg/dL — ABNORMAL HIGH (ref 70–99)
Glucose-Capillary: 130 mg/dL — ABNORMAL HIGH (ref 70–99)
Glucose-Capillary: 97 mg/dL (ref 70–99)
Glucose-Capillary: 156 mg/dL — ABNORMAL HIGH (ref 70–99)
Glucose-Capillary: 121 mg/dL — ABNORMAL HIGH (ref 70–99)

## 2021-01-24 LAB — TYPE AND SCREEN
ABO/RH(D): O POS
Antibody Screen: NEGATIVE
Unit division: 0

## 2021-01-24 LAB — CBC
HCT: 25.6 % — ABNORMAL LOW (ref 36.0–46.0)
Hemoglobin: 8.1 g/dL — ABNORMAL LOW (ref 12.0–15.0)
MCH: 28.6 pg (ref 26.0–34.0)
MCHC: 31.6 g/dL (ref 30.0–36.0)
MCV: 90.5 fL (ref 80.0–100.0)
Platelets: 87 10*3/uL — ABNORMAL LOW (ref 150–400)
RBC: 2.83 MIL/uL — ABNORMAL LOW (ref 3.87–5.11)
RDW: 21.5 % — ABNORMAL HIGH (ref 11.5–15.5)
WBC: 6.6 10*3/uL (ref 4.0–10.5)
nRBC: 0 % (ref 0.0–0.2)

## 2021-01-24 LAB — BPAM RBC
Blood Product Expiration Date: 202301252359
ISSUE DATE / TIME: 202301120945
Unit Type and Rh: 5100

## 2021-01-24 MED ORDER — POTASSIUM CHLORIDE 20 MEQ PO PACK: 40.0000 meq | PACK | Freq: Every day | ORAL | 0 refills | Status: AC

## 2021-01-24 MED ORDER — FUROSEMIDE 80 MG PO TABS: 80.0000 mg | ORAL_TABLET | Freq: Every day | ORAL | 0 refills | Status: AC

## 2021-01-24 NOTE — TOC Transition Note (Addendum)
Transition of Care [facility]) - CM/SW Discharge Note   Patient Details  Name: Autumn [name] MRN: [id number] Date of Birth: [birth year]  [facility] CM/SW Contact:  Tresa Endo Phone Number: 01/24/2021, 4:26 PM   Clinical Narrative:    Patient will DC to: [facility]  Anticipated DC date: 01/24/2021 Family notified: Friend Transport by: Corey Harold   Per MD patient ready for DC to [facility] room 101. RN to call report prior to discharge [phone]). RN, patient, patient's family, and facility notified of DC. Discharge Summary and FL2 sent to facility. DC packet on chart. Ambulance transport requested for patient.   CSW will sign off for now as social work intervention is no longer needed. Please consult [place] again if new needs arise.       Final next level of care: Skilled Nursing Facility Barriers to Discharge: Continued Medical Work up   Patient Goals and CMS Choice Patient states their goals for this hospitalization and ongoing recovery are:: Rehab CMS Medicare.gov Compare Post Acute Care list provided to:: Patient Choice offered to / list presented to : Patient  Discharge Placement                       Discharge Plan and Services In-house Referral: Clinical Social Work   Post Acute Care Choice: [place]                               Social Determinants of Health (SDOH) Interventions     Readmission Risk Interventions No flowsheet data found.

## 2021-01-24 NOTE — Progress Notes (Signed)
[facility] Progress Note   71 y.o. female with a past medical history significant for DM2, HTN, hypothyroidism, NASH cirrhosis, pAfib, CVA, admitted for COVID pneumonia c/b AKI on top of advanced CKD4.     Assessment/ Plan:   1) AKI on CKD 4, now dialysis dependent: Baseline CKD likely arterionephrosclerosis with diabetic kidney disease and possibly some contribution of HRS given cirrhosis although less likely.    UA does show some protein but not extensive.  CKD has been progressive also with recent AKI during hospitalization for osteomyelitis.  Baseline creatinine around 3.6-4 at this time.   Now with AKI likely secondary to ATN associated with COVID.  Creatinine continues to slowly worsen with levels >7.  HD #1 on 1/4  Appreciate VIR tunneled catheter for initiation of dialysis  TTS regimen- next HD 01/25/21; if still here next week will need HD Monday   Appreciate Dr. Carlis Abbott VVS seeing the patient and placing access  CLIP to [facility] MWF   -Continue to monitor daily Cr, Dose meds for GFR -Monitor Daily I/Os, Daily weight  -Maintain MAP>65 for optimal renal perfusion.  -Avoid nephrotoxic medications including NSAIDs.   2) Acute hypoxic respiratory failure secondary to COVID-pneumonia: overall improved.  Continue management per primary team.     3) Anemia: Likely multifactorial with acute illness, chronic inflammation, CKD contributing.  Continue transfusion per primary team.  - S/P  transfusion; GI believes it's more c/w hemorrhoidal bleeding and no scope indicated.   5) UTI: Pyuria on urinalysis.  s/p ceftriaxone   6) Atrial fibrillation: tachycardic now on HD w/ UF. Management per primary  7) Left foot wound: Status post amputation, wound open.  Management per primary  8) Diabetes Mellitus Type 2 with Hyperglycemia: Management per primary  9) Renal osteodystrophy - phos 3.6 1/6    Subjective:    Seen in room.  Awaiting SNF- d/c fell thorugh  today   Objective:   BP 135/63 (BP Location: Left Arm)    Pulse 96    Temp 97.9 F (36.6 C) (Oral)    Resp (!) 22    Ht 5\' 7"  (1.702 m)    Wt 81.3 kg    SpO2 100%    BMI 28.07 kg/m   Intake/Output Summary (Last 24 hours) at 01/24/2021 1300 Last data filed at 01/24/2021 0600 Gross per 24 hour  Intake 200 ml  Output 0 ml  Net 200 ml   Weight change: -0.3 kg  Physical Exam: Constitutional: NAD, lying in bed CV: RRR Respiratory: clear bilaterally Gastrointestinal: soft, nontender Skin: no visible lesions or rashes Ext: trace LE edema, bilateral UE edema Access: RIJ TC, rt BBF good thrill  Imaging: No results found.  Labs: BMET Recent Labs  Lab 01/18/21 0357 01/19/21 0423 01/20/21 9935 01/21/21 0251 01/22/21 0407 01/23/21 0342 01/24/21 0221  NA 137 140 137 135 139 138 138  K 3.3* 3.1* 3.2* 2.8* 3.0* 3.1* 3.2*  CL 104 100 99 103 102 104 104  CO2 25 28 26 23 26 25 27   GLUCOSE 170* 140* 112* 170* 109* 140* 125*  BUN 43* 28* 31* 39* 19 29* 26*  CREATININE 4.04* 3.08* 3.77* 4.21* 2.84* 4.21* 3.53*  CALCIUM 8.2* 8.0* 8.3* 7.7* 7.8* 7.7* 7.6*  PHOS  --   --   --  3.0  --   --   --    CBC Recent Labs  Lab 01/21/21 0251 01/22/21 0407 01/23/21 0342 01/24/21 0221  WBC 9.7 7.6 9.0 6.6  HGB 7.3* 7.2* 6.8* 8.1*  HCT 22.0* 23.1* 21.7* 25.6*  MCV 88.0 92.4 91.9 90.5  PLT 90* 87* 98* 87*    Medications:     (feeding supplement) PROSource Plus  30 mL Oral BID BM   sodium chloride   Intravenous Once   apixaban  5 mg Oral BID   atorvastatin  40 mg Oral Daily   chlorhexidine  15 mL Mouth/Throat Once   Or   mouth rinse  15 mL Mouth Rinse Once   Chlorhexidine Gluconate Cloth  6 each Topical Daily   collagenase   Topical Daily   diltiazem  180 mg Oral Daily   feeding supplement  1 Container Oral TID BM   hydrocortisone  25 mg Rectal BID   insulin aspart  0-15 Units Subcutaneous TID WC   insulin aspart  0-5 Units Subcutaneous QHS   levothyroxine  100 mcg Oral Q0600    metoprolol tartrate  50 mg Oral BID   multivitamin  1 tablet Oral QHS   pantoprazole  40 mg Oral BID   polyethylene glycol  17 g Oral Daily   senna-docusate  2 tablet Oral BID     Madelon Lips MD 01/24/2021, 1:00 PM

## 2021-01-24 NOTE — Plan of Care (Signed)

## 2021-01-24 NOTE — Progress Notes (Signed)
HD#15 SUBJECTIVE:  Patient Summary: Autumn [name] is a 71 y.o. 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  Overnight Events: No acute events overnight  Interim History: This is hospital day 15 for Autumn [name] who was seen and evaluated at the bedside this morning. She says that she feels tired today. Has no acute concerns.   OBJECTIVE:  Vital Signs: Vitals:   01/23/21 2000 01/24/21 0000 01/24/21 0400 01/24/21 0500  BP: (!) 126/95 115/65 (!) 141/69 128/80  Pulse: (!) 112 87 100 97  Resp: 20 (!) 26 (!) 22 (!) 24  Temp: 98.6 F (37 C) 98.1 F (36.7 C)  (!) 97.4 F (36.3 C)  TempSrc: Oral Oral  Oral  SpO2: 100% 100% 100% 100%  Weight:  81.3 kg    Height:       Supplemental O2: Room Air SpO2: 100 % O2 Flow Rate (L/min): 3.5 L/min FiO2 (%): 40 %  Filed Weights   01/23/21 0506 01/23/21 0908 01/24/21 0000  Weight: 81.4 kg 81.1 kg 81.3 kg     Intake/Output Summary (Last 24 hours) at 01/24/2021 0600 Last data filed at 01/24/2021 0500 Gross per 24 hour  Intake 315 ml  Output 1000 ml  Net -685 ml   Net IO Since Admission: -3,262.36 mL [01/24/21 0600]  Physical Exam: General: Chronically ill-appearing female laying in bed. No acute distress. CV: Regular rate, irregular rhythm. No murmurs, rubs, or gallops. 1+ LE edema Pulmonary: Lungs CTAB. Normal effort. Extremities: R AVF with thrill. 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 Scattered ecchymosis over chest and arms Neuro: Awake and alert. Able to follow commands Psych: Normal mood and affect       ASSESSMENT/PLAN:  Assessment: Principal Problem:   COVID-19 Active Problems:   Atrial fibrillation with RVR (HCC)   Acute kidney injury  superimposed on CKD (HCC)   Liver cirrhosis secondary to NASH (nonalcoholic steatohepatitis) (HCC)   Anemia of chronic disease   Type II diabetes mellitus (HCC)   GI bleeding   Protein-calorie malnutrition, severe   Plan: #AKI on CKD IV progressing to ESRD on HD Patient remains oliguric. Had short HD session yesterday, in transition to her new MWF schedule. Pending insurance auth for SNF placement at this point- patient has been medically stable for discharge.  - Will discharge to [facility] with [facility] for HD - Continue strict I&Os and daily weights  #Hospital delirium Patient likely experiencing hospital delirium with waxing and waning course. She is awake and alert today, although she remains lethargic. Encourage patient get out of bed with PT/OT. Will continue to ensure patient does not get any sedating medications.   #Acute on chronic anemia #Thrombocytopenia Hb improved to 8.1 after receiving 1u PRBC yesterday. No active signs of bleeding noted today. - Daily CBC - Eliquis 5 mg bid  Best Practice: Diet: Renal diet IVF: Fluids: none VTE: Eliquis Code: Full AB: none Therapy Recs: SNF Family Contact: Juanda Bond, to be notified. DISPO: Anticipated discharge  today or tomorrow  to Skilled nursing facility pending  placement .  Signature: Buddy Duty, D.O.  Internal Medicine Resident, PGY-1 [facility]  Pager: [phone] 6:00 AM, 01/24/2021   Please contact the on call  pager after 5 pm and on weekends at [phone].

## 2021-01-24 NOTE — Progress Notes (Signed)
D/C order noted. [place] and spoke to [employer]. Clinic advised pt for d/c today and will start at clinic on Monday. D/C summary faxed to clinic for continuation of care (Fax# [phone]). Also spoke to [place] [phone]) with [employer] regarding pt's start date of Monday. Confirmed with CSW pt's schedule of MWF and that pt will need to arrive Monday at 10:30 for paperwork for 11:00 chair time. CSW to provide info to snf.    Melven Sartorius Renal Navigator [phone]

## 2021-01-24 NOTE — Plan of Care (Signed)
Problem: Pain Managment: Goal: General experience of comfort will improve Outcome: Completed/Met

## 2021-01-25 LAB — BASIC METABOLIC PANEL
Anion gap: 9 (ref 5–15)
BUN: 38 mg/dL — ABNORMAL HIGH (ref 8–23)
CO2: 25 mmol/L (ref 22–32)
Calcium: 7.9 mg/dL — ABNORMAL LOW (ref 8.9–10.3)
Chloride: 103 mmol/L (ref 98–111)
Creatinine, Ser: 4.52 mg/dL — ABNORMAL HIGH (ref 0.44–1.00)
GFR, Estimated: 10 mL/min — ABNORMAL LOW (ref >60)
Glucose, Bld: 211 mg/dL — ABNORMAL HIGH (ref 70–99)
Potassium: 3.2 mmol/L — ABNORMAL LOW (ref 3.5–5.1)
Sodium: 137 mmol/L (ref 135–145)

## 2021-01-25 LAB — CBC
HCT: 26.4 % — ABNORMAL LOW (ref 36.0–46.0)
Hemoglobin: 8.5 g/dL — ABNORMAL LOW (ref 12.0–15.0)
MCH: 29 pg (ref 26.0–34.0)
MCHC: 32.2 g/dL (ref 30.0–36.0)
MCV: 90.1 fL (ref 80.0–100.0)
Platelets: 83 10*3/uL — ABNORMAL LOW (ref 150–400)
RBC: 2.93 MIL/uL — ABNORMAL LOW (ref 3.87–5.11)
RDW: 20.9 % — ABNORMAL HIGH (ref 11.5–15.5)
WBC: 6.5 10*3/uL (ref 4.0–10.5)
nRBC: 0 % (ref 0.0–0.2)

## 2021-01-25 LAB — GLUCOSE, CAPILLARY: Glucose-Capillary: 148 mg/dL — ABNORMAL HIGH (ref 70–99)

## 2021-01-27 LAB — CULTURE, BLOOD (ROUTINE X 2)
Culture: NO GROWTH
Culture: NO GROWTH
Special Requests: ADEQUATE
Special Requests: ADEQUATE

## 2021-02-19 ENCOUNTER — Other Ambulatory Visit: Payer: Self-pay

## 2021-02-19 DIAGNOSIS — N189 Chronic kidney disease, unspecified: Secondary | ICD-10-CM

## 2021-02-25 ENCOUNTER — Other Ambulatory Visit (HOSPITAL_COMMUNITY): Payer: Self-pay | Admitting: Physician Assistant

## 2021-02-25 ENCOUNTER — Encounter (HOSPITAL_COMMUNITY): Payer: Medicare (Managed Care)

## 2021-02-25 DIAGNOSIS — I739 Peripheral vascular disease, unspecified: Secondary | ICD-10-CM

## 2022-07-24 IMAGING — US US RENAL
1 series · 14 of 25 positions shown · non-contrast
Comparison: 11/09/2020

CLINICAL DATA: DARIUSZ MAREK

EXAM:
RENAL / URINARY TRACT ULTRASOUND COMPLETE

[Series 1: us renal · 14 of 31 slices shown]
[im 1/31]
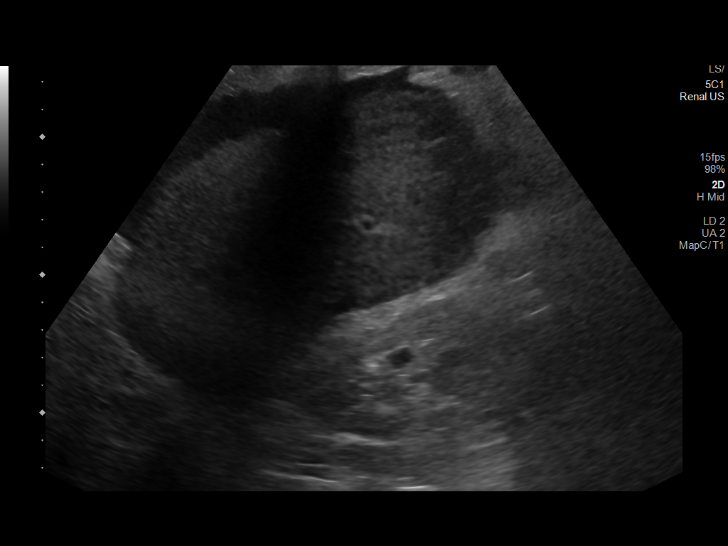
[im 3/31]
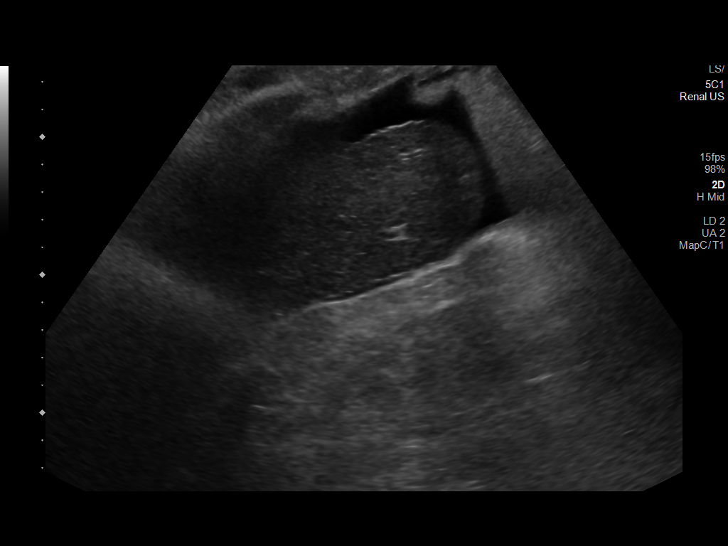
[im 6/31]
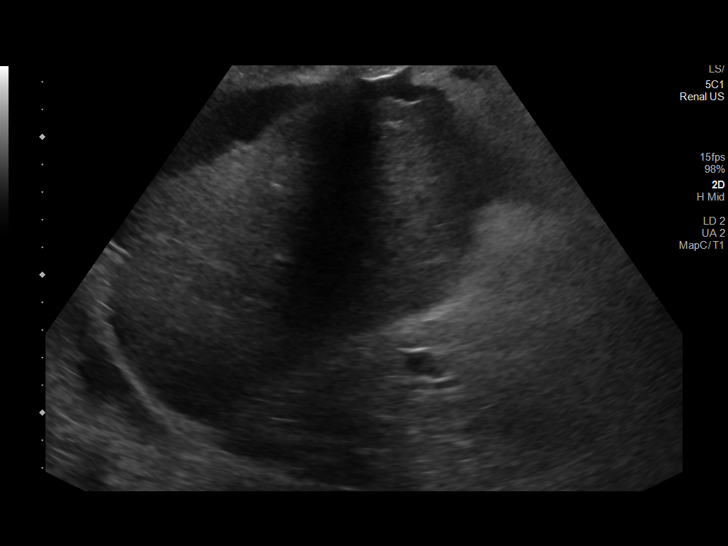
[im 8/31]
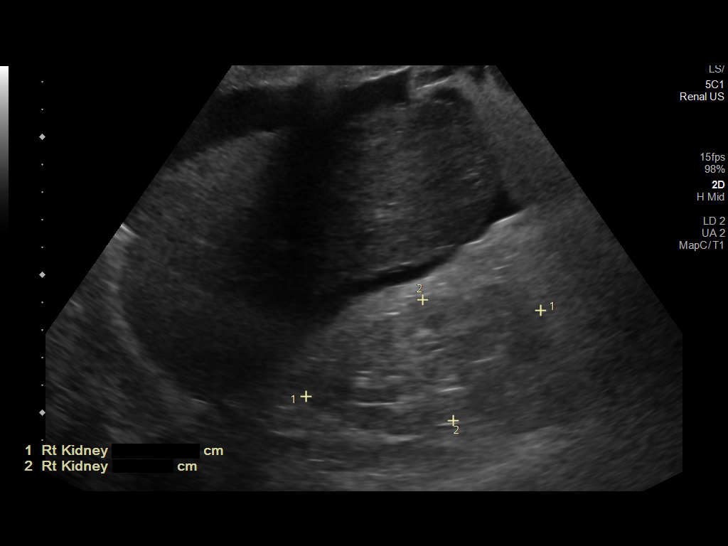
[im 11/31]
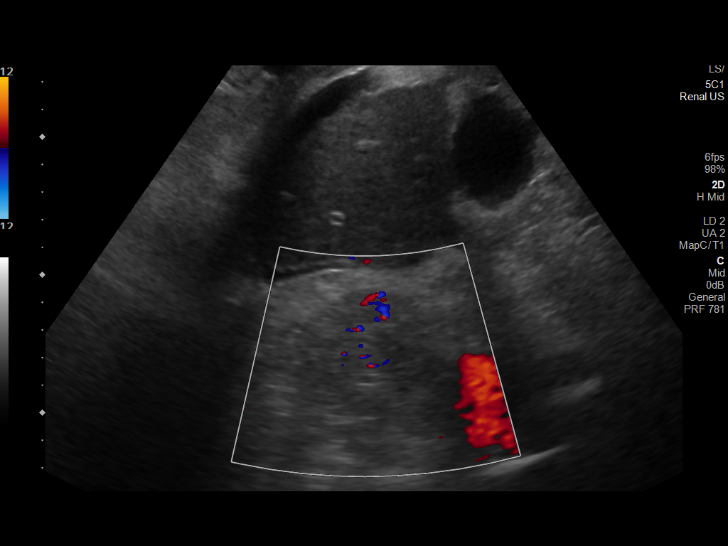
[im 12/31]
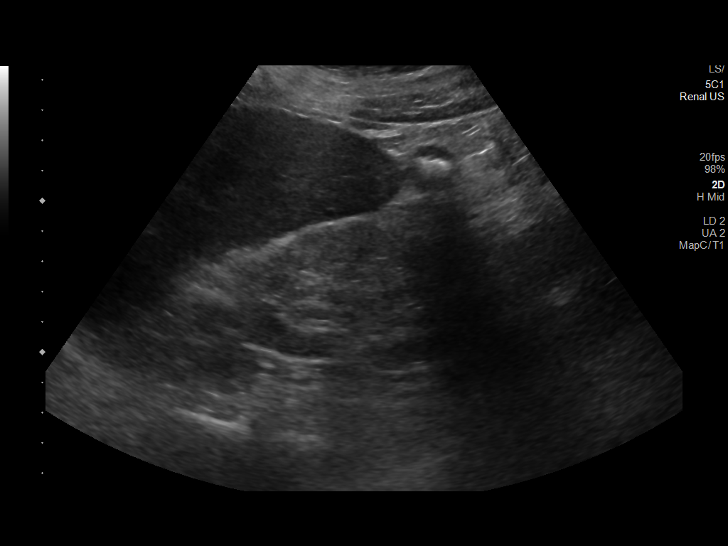
[im 14/31]
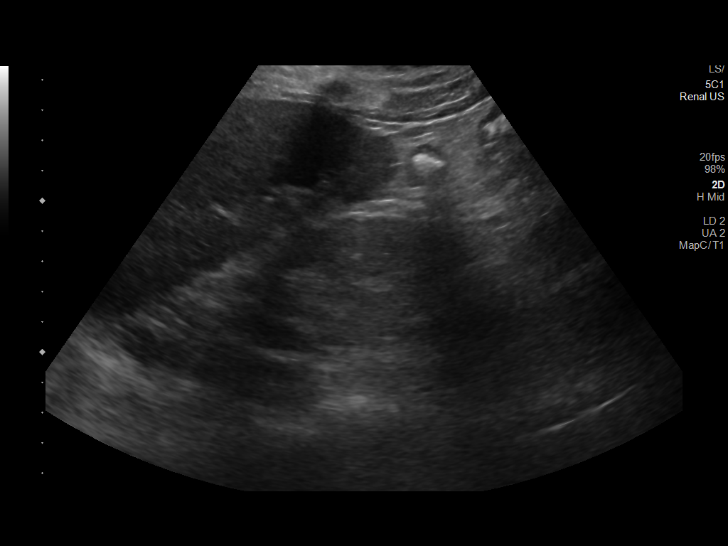
[im 17/31]
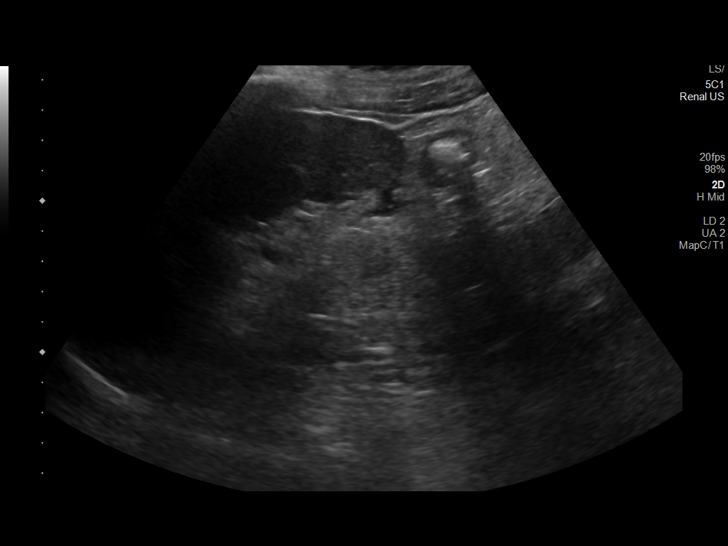
[im 19/31]
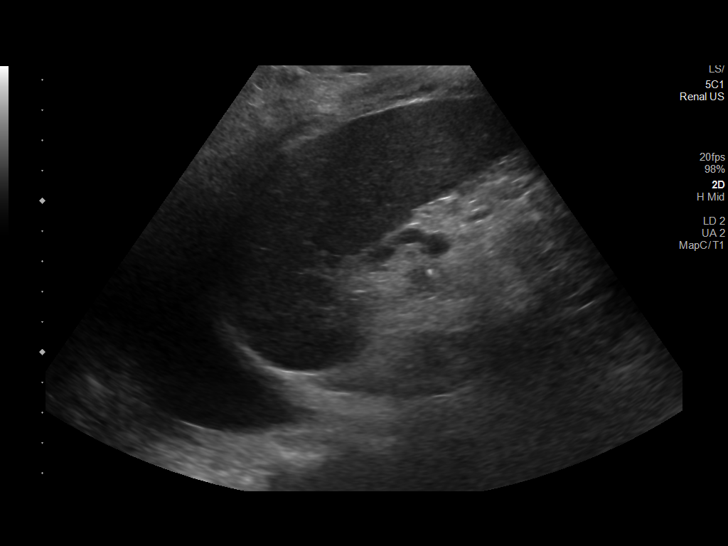
[im 21/31]
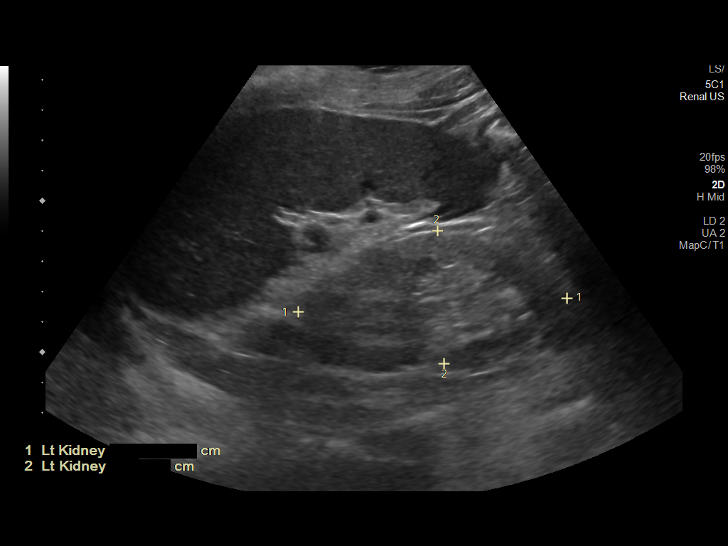
[im 23/31]
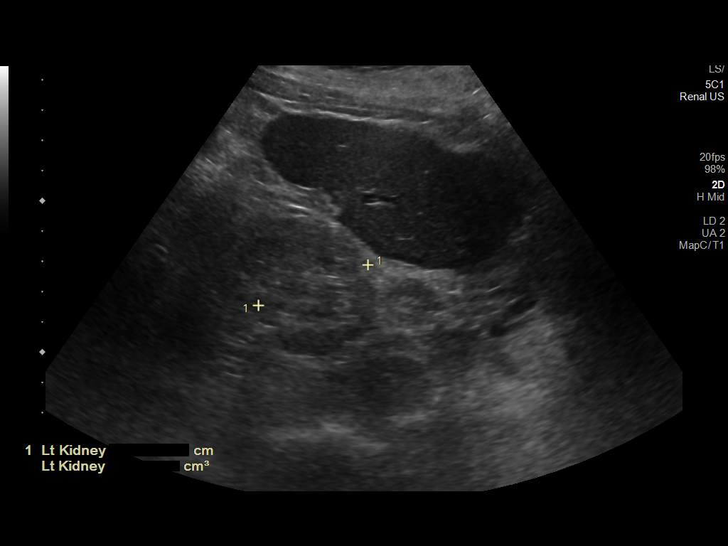
[im 26/31]
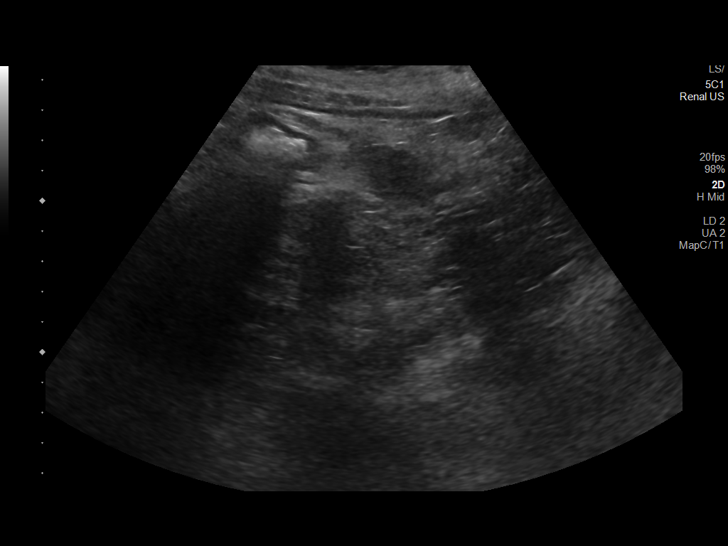
[im 28/31]
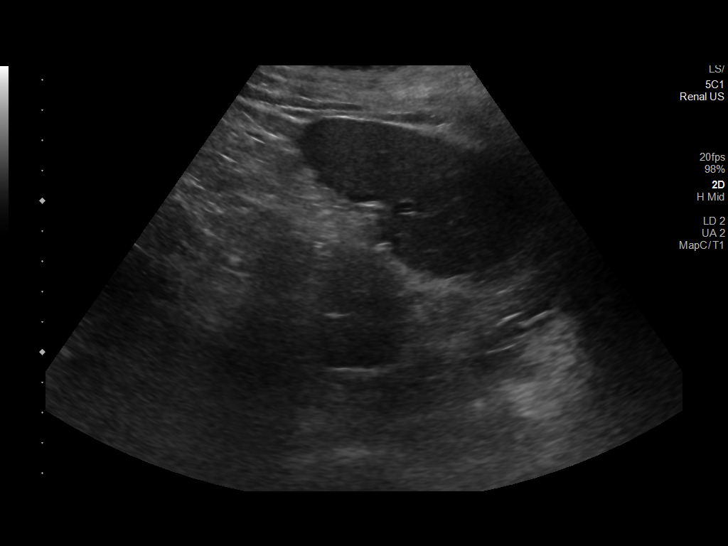
[im 31/31]
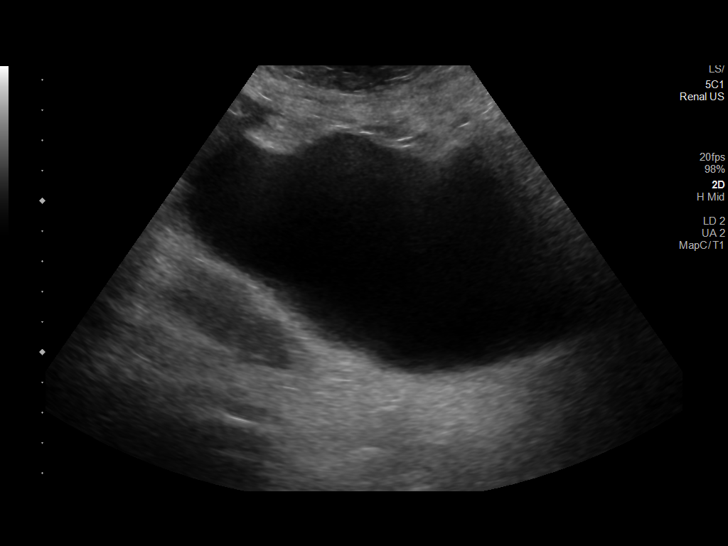

[14 of 25 positions shown; findings below may reference images not displayed]

FINDINGS: Right Kidney:

Renal measurements: 9.1 x 4.5 x 4.3 cm = volume: 93.1 mL. Increased
renal echogenicity. No mass or hydronephrosis visualized.

Left Kidney:

Renal measurements: 8.9 x 4.4 x 3.9 cm = volume: 79.1 mL. Increased
renal echogenicity. No mass or hydronephrosis visualized.

Bladder:

Appears normal for degree of bladder distention.

Other:

Ascites in the right upper quadrant. Left-greater-than-right pleural
effusions.
IMPRESSION: 1. Increased bilateral renal parenchymal echogenicity, similar to
the prior exam, consistent with chronic medical renal disease.
Otherwise unremarkable kidneys.
2. Right upper quadrant ascites.
3. Left-greater-than-right pleural effusions.

## 2022-08-05 IMAGING — DX DG CHEST 1V PORT
1 series · 1 of 1 positions shown · non-contrast
Comparison: 01/13/2021
COMPARISON: 01/13/2021

Addendum:
CLINICAL DATA: Shortness of breath

EXAM:
PORTABLE CHEST 1 VIEW

[chest ap]
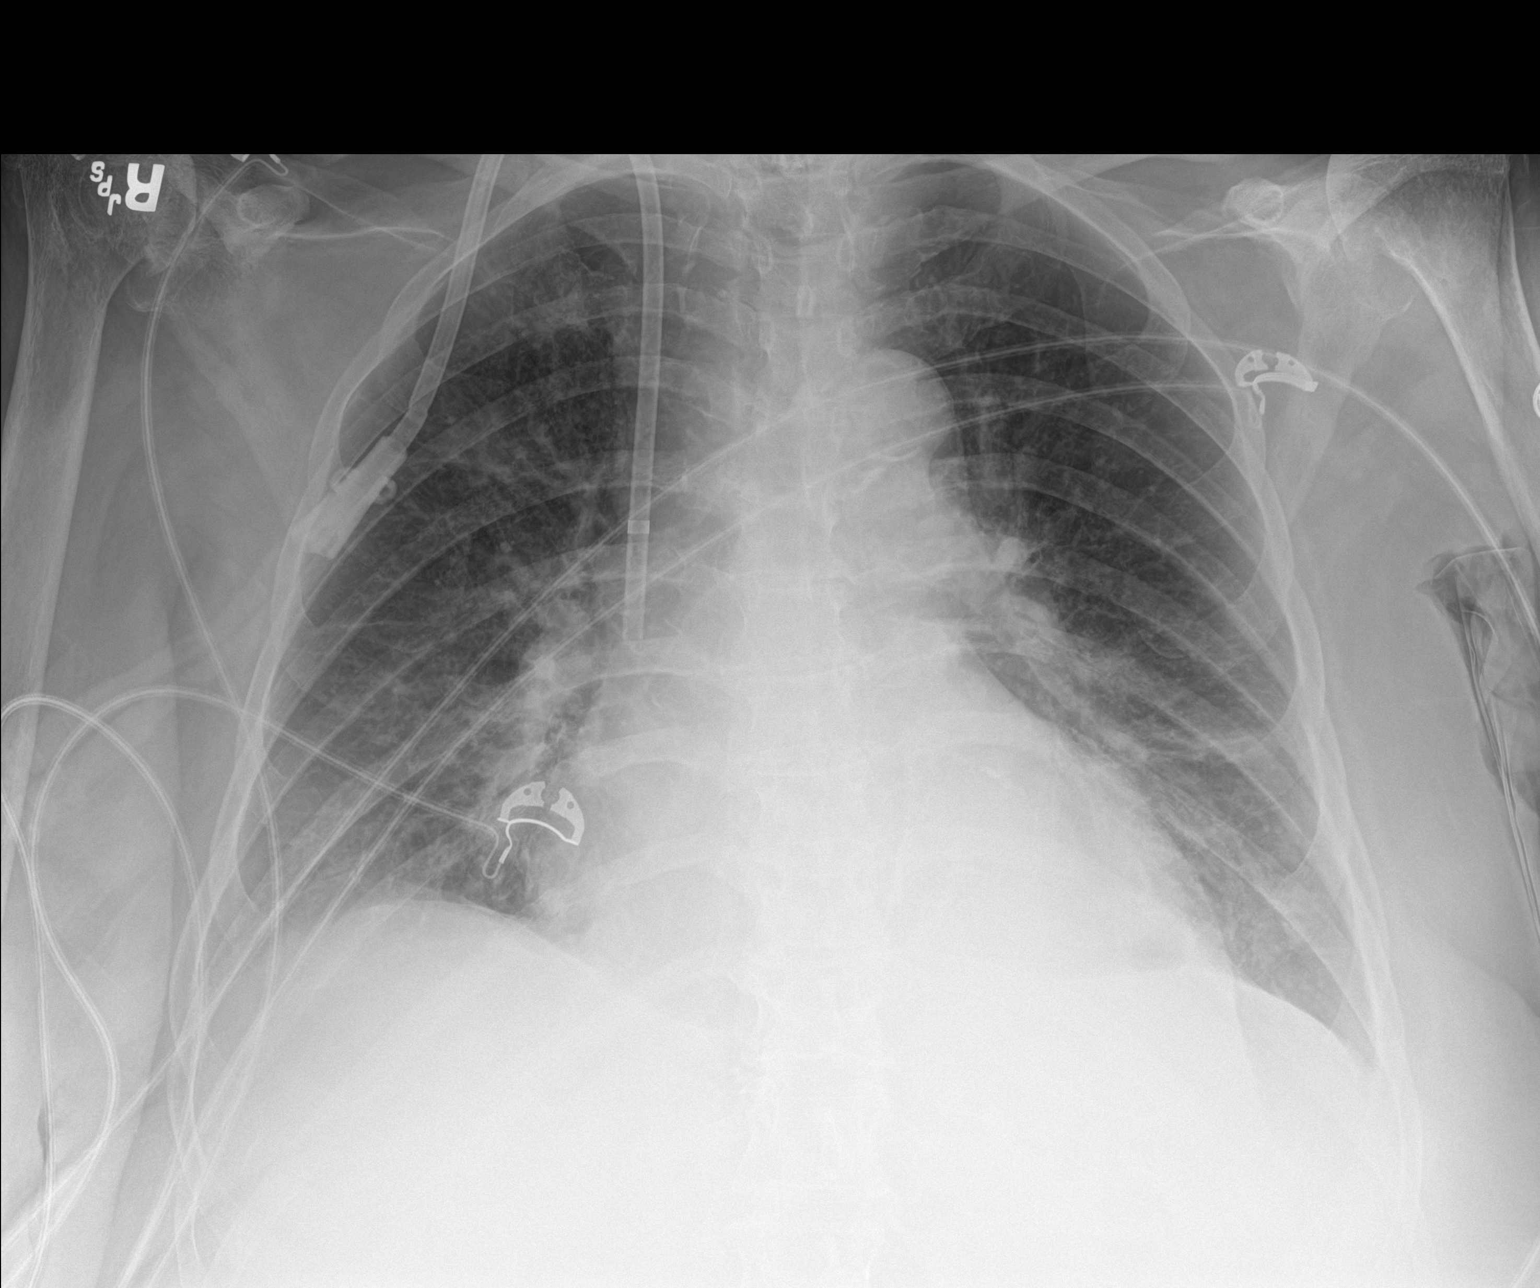

[1 of 1 positions shown; findings below may reference images not displayed]

FINDINGS: Transverse diameter of heart is increased. Central pulmonary vessels
are prominent. Increased interstitial markings are seen in the
parahilar regions and lower lung fields. There is blunting of right
lateral CP angle. There is no new focal pulmonary consolidation.
There is no pneumothorax. There is interval placement of large
caliber central venous catheter through the right jugular vein with
its tip in the superior vena cava close to the right atrium.
IMPRESSION: Cardiomegaly. There is prominence of central pulmonary vessels and
increased interstitial markings in both lungs suggesting CHF.
Possible small right pleural effusion.

ADDENDUM:
There are no cavitary lesions in the lung fields. As far as seen,
there is no definite evidence of significant mediastinal or hilar
lymphadenopathy. There are no imaging signs of pulmonary
tuberculosis.

*** End of Addendum ***
FINDINGS: Transverse diameter of heart is increased. Central pulmonary vessels
are prominent. Increased interstitial markings are seen in the
parahilar regions and lower lung fields. There is blunting of right
lateral CP angle. There is no new focal pulmonary consolidation.
There is no pneumothorax. There is interval placement of large
caliber central venous catheter through the right jugular vein with
its tip in the superior vena cava close to the right atrium.
IMPRESSION: Cardiomegaly. There is prominence of central pulmonary vessels and
increased interstitial markings in both lungs suggesting CHF.
Possible small right pleural effusion.
